# Patient Record
Sex: Female | Born: 1955
Health system: Southern US, Community
[De-identification: ages and names within clinical notes are randomized; demographics above are authoritative.]

## PROBLEM LIST (undated history)

## (undated) ENCOUNTER — Encounter: Attending: Internal Medicine | Primary: Internal Medicine

## (undated) ENCOUNTER — Ambulatory Visit: Attending: Pharmacist | Primary: Pharmacist

## (undated) ENCOUNTER — Ambulatory Visit: Payer: PRIVATE HEALTH INSURANCE

## (undated) ENCOUNTER — Telehealth: Attending: Internal Medicine | Primary: Internal Medicine

## (undated) ENCOUNTER — Encounter

## (undated) ENCOUNTER — Ambulatory Visit: Payer: PRIVATE HEALTH INSURANCE | Attending: Internal Medicine | Primary: Internal Medicine

## (undated) ENCOUNTER — Telehealth

## (undated) DIAGNOSIS — J930 Spontaneous tension pneumothorax: Secondary | ICD-10-CM

## (undated) DIAGNOSIS — R51 Headache: Secondary | ICD-10-CM

## (undated) DIAGNOSIS — H269 Unspecified cataract: Secondary | ICD-10-CM

## (undated) DIAGNOSIS — K529 Noninfective gastroenteritis and colitis, unspecified: Secondary | ICD-10-CM

## (undated) DIAGNOSIS — K649 Unspecified hemorrhoids: Secondary | ICD-10-CM

## (undated) DIAGNOSIS — K219 Gastro-esophageal reflux disease without esophagitis: Secondary | ICD-10-CM

## (undated) DIAGNOSIS — J479 Bronchiectasis, uncomplicated: Secondary | ICD-10-CM

## (undated) DIAGNOSIS — I871 Compression of vein: Secondary | ICD-10-CM

## (undated) DIAGNOSIS — L439 Lichen planus, unspecified: Secondary | ICD-10-CM

## (undated) DIAGNOSIS — A310 Pulmonary mycobacterial infection: Principal | ICD-10-CM

## (undated) DIAGNOSIS — S43006A Unspecified dislocation of unspecified shoulder joint, initial encounter: Secondary | ICD-10-CM

## (undated) DIAGNOSIS — J449 Chronic obstructive pulmonary disease, unspecified: Secondary | ICD-10-CM

## (undated) DIAGNOSIS — E538 Deficiency of other specified B group vitamins: Secondary | ICD-10-CM

## (undated) DIAGNOSIS — M1990 Unspecified osteoarthritis, unspecified site: Secondary | ICD-10-CM

## (undated) HISTORY — DX: Gastro-esophageal reflux disease without esophagitis: K21.9

## (undated) HISTORY — DX: Lichen planus, unspecified: L43.9

## (undated) HISTORY — PX: KNEE SURGERY: SHX244

## (undated) HISTORY — PX: OTHER SURGICAL HISTORY: SHX169

## (undated) HISTORY — DX: Deficiency of other specified B group vitamins: E53.8

## (undated) HISTORY — DX: Noninfective gastroenteritis and colitis, unspecified: K52.9

## (undated) HISTORY — DX: Pulmonary mycobacterial infection: A31.0

## (undated) HISTORY — DX: Unspecified cataract: H26.9

## (undated) HISTORY — DX: Chronic obstructive pulmonary disease, unspecified: J44.9

## (undated) HISTORY — DX: Unspecified dislocation of unspecified shoulder joint, initial encounter: S43.006A

## (undated) HISTORY — DX: Bronchiectasis, uncomplicated: J47.9

## (undated) HISTORY — DX: Headache: R51

## (undated) HISTORY — PX: TONSILLECTOMY: SUR1361

---
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
## 2004-06-21 ENCOUNTER — Other Ambulatory Visit: Admission: RE | Admit: 2004-06-21 | Discharge: 2004-06-21 | Payer: Self-pay | Admitting: Obstetrics and Gynecology

## 2004-06-28 ENCOUNTER — Encounter: Admission: RE | Admit: 2004-06-28 | Discharge: 2004-06-28 | Payer: Self-pay | Admitting: Obstetrics and Gynecology

## 2004-10-12 ENCOUNTER — Ambulatory Visit: Payer: Self-pay | Admitting: Gastroenterology

## 2004-11-15 ENCOUNTER — Ambulatory Visit: Payer: Self-pay | Admitting: Internal Medicine

## 2005-01-17 ENCOUNTER — Ambulatory Visit: Payer: Self-pay | Admitting: Internal Medicine

## 2005-03-29 ENCOUNTER — Ambulatory Visit: Payer: Self-pay | Admitting: Internal Medicine

## 2005-05-24 ENCOUNTER — Ambulatory Visit: Payer: Self-pay | Admitting: Internal Medicine

## 2005-07-14 ENCOUNTER — Ambulatory Visit: Payer: Self-pay | Admitting: Internal Medicine

## 2005-08-22 ENCOUNTER — Ambulatory Visit: Payer: Self-pay | Admitting: Internal Medicine

## 2005-10-06 ENCOUNTER — Ambulatory Visit: Payer: Self-pay | Admitting: Internal Medicine

## 2005-10-13 ENCOUNTER — Ambulatory Visit: Payer: Self-pay | Admitting: Internal Medicine

## 2005-10-18 ENCOUNTER — Ambulatory Visit: Payer: Self-pay | Admitting: Internal Medicine

## 2005-11-02 ENCOUNTER — Ambulatory Visit: Payer: Self-pay | Admitting: Internal Medicine

## 2005-11-10 ENCOUNTER — Ambulatory Visit: Payer: Self-pay | Admitting: Cardiology

## 2005-11-11 ENCOUNTER — Ambulatory Visit: Payer: Self-pay | Admitting: Pulmonary Disease

## 2005-12-20 ENCOUNTER — Ambulatory Visit: Payer: Self-pay | Admitting: Pulmonary Disease

## 2006-04-06 ENCOUNTER — Ambulatory Visit: Payer: Self-pay | Admitting: Internal Medicine

## 2006-07-10 ENCOUNTER — Ambulatory Visit: Payer: Self-pay | Admitting: Internal Medicine

## 2006-08-02 ENCOUNTER — Ambulatory Visit: Payer: Self-pay | Admitting: Internal Medicine

## 2006-09-14 ENCOUNTER — Ambulatory Visit: Payer: Self-pay | Admitting: Internal Medicine

## 2006-10-09 ENCOUNTER — Ambulatory Visit: Payer: Self-pay | Admitting: Internal Medicine

## 2006-10-11 ENCOUNTER — Ambulatory Visit: Payer: Self-pay | Admitting: Internal Medicine

## 2006-12-30 DIAGNOSIS — M810 Age-related osteoporosis without current pathological fracture: Secondary | ICD-10-CM | POA: Insufficient documentation

## 2006-12-30 DIAGNOSIS — J449 Chronic obstructive pulmonary disease, unspecified: Secondary | ICD-10-CM | POA: Insufficient documentation

## 2006-12-30 DIAGNOSIS — K219 Gastro-esophageal reflux disease without esophagitis: Secondary | ICD-10-CM | POA: Insufficient documentation

## 2006-12-30 DIAGNOSIS — E538 Deficiency of other specified B group vitamins: Secondary | ICD-10-CM | POA: Insufficient documentation

## 2006-12-30 DIAGNOSIS — J45909 Unspecified asthma, uncomplicated: Secondary | ICD-10-CM | POA: Insufficient documentation

## 2006-12-30 DIAGNOSIS — G8929 Other chronic pain: Secondary | ICD-10-CM | POA: Insufficient documentation

## 2006-12-30 DIAGNOSIS — L439 Lichen planus, unspecified: Secondary | ICD-10-CM | POA: Insufficient documentation

## 2006-12-30 DIAGNOSIS — N809 Endometriosis, unspecified: Secondary | ICD-10-CM | POA: Insufficient documentation

## 2007-01-11 ENCOUNTER — Ambulatory Visit: Payer: Self-pay | Admitting: Internal Medicine

## 2007-01-11 LAB — CONVERTED CEMR LAB
Hemoglobin, Urine: NEGATIVE
Leukocytes, UA: NEGATIVE
RBC / HPF: NONE SEEN (ref <3)
Specific Gravity, Urine: 1.01 (ref 1.005–1.03)
Urine Glucose: NEGATIVE mg/dL
WBC, UA: NONE SEEN cells/hpf (ref <3)
pH: 6.5 (ref 5.0–8.0)

## 2007-01-30 ENCOUNTER — Ambulatory Visit: Payer: Self-pay | Admitting: Internal Medicine

## 2007-01-31 ENCOUNTER — Encounter: Payer: Self-pay | Admitting: Internal Medicine

## 2007-02-04 ENCOUNTER — Encounter: Payer: Self-pay | Admitting: Gastroenterology

## 2007-02-15 ENCOUNTER — Ambulatory Visit: Payer: Self-pay | Admitting: Internal Medicine

## 2007-04-11 ENCOUNTER — Ambulatory Visit: Payer: Self-pay | Admitting: Internal Medicine

## 2007-04-11 LAB — CONVERTED CEMR LAB
Hemoglobin, Urine: NEGATIVE
Ketones, ur: NEGATIVE mg/dL
Nitrite: NEGATIVE
Protein, ur: NEGATIVE mg/dL
Urobilinogen, UA: 0.2 (ref 0.0–1.0)

## 2007-06-11 ENCOUNTER — Telehealth: Payer: Self-pay | Admitting: Internal Medicine

## 2007-07-19 ENCOUNTER — Ambulatory Visit: Payer: Self-pay | Admitting: Internal Medicine

## 2007-07-19 LAB — CONVERTED CEMR LAB
Bilirubin Urine: NEGATIVE
Glucose, Urine, Semiquant: NEGATIVE
Ketones, urine, test strip: NEGATIVE
Protein, U semiquant: NEGATIVE
Urobilinogen, UA: NEGATIVE
pH: 5

## 2007-07-20 ENCOUNTER — Encounter (INDEPENDENT_AMBULATORY_CARE_PROVIDER_SITE_OTHER): Payer: Self-pay | Admitting: *Deleted

## 2007-07-20 LAB — CONVERTED CEMR LAB
BUN: 6 mg/dL (ref 6–23)
Basophils Absolute: 0 10*3/uL (ref 0.0–0.1)
Basophils Relative: 0.4 % (ref 0.0–1.0)
CO2: 29 meq/L (ref 19–32)
Calcium: 9.6 mg/dL (ref 8.4–10.5)
Cholesterol: 213 mg/dL (ref 0–200)
Direct LDL: 124.4 mg/dL
GFR calc Af Amer: 98 mL/min
GFR calc non Af Amer: 81 mL/min
HDL: 60.8 mg/dL (ref >39.0)
Lymphocytes Relative: 29.9 % (ref 12.0–46.0)
MCHC: 34.6 g/dL (ref 30.0–36.0)
Monocytes Absolute: 0.5 10*3/uL (ref 0.2–0.7)
Monocytes Relative: 6.2 % (ref 3.0–11.0)
Neutro Abs: 4.9 10*3/uL (ref 1.4–7.7)
Platelets: 429 10*3/uL — ABNORMAL HIGH (ref 150–400)
Potassium: 4 meq/L (ref 3.5–5.1)
TSH: 0.63 microintl units/mL (ref 0.35–5.50)
Total CHOL/HDL Ratio: 3.5
VLDL: 24 mg/dL (ref 0–40)
Vitamin B-12: 477 pg/mL (ref 211–911)

## 2007-10-02 ENCOUNTER — Ambulatory Visit: Payer: Self-pay | Admitting: Internal Medicine

## 2007-10-18 ENCOUNTER — Telehealth (INDEPENDENT_AMBULATORY_CARE_PROVIDER_SITE_OTHER): Payer: Self-pay | Admitting: *Deleted

## 2007-10-18 ENCOUNTER — Ambulatory Visit: Payer: Self-pay | Admitting: Internal Medicine

## 2007-10-19 ENCOUNTER — Encounter: Payer: Self-pay | Admitting: Internal Medicine

## 2007-10-19 ENCOUNTER — Ambulatory Visit: Payer: Self-pay | Admitting: Gastroenterology

## 2007-10-22 ENCOUNTER — Encounter: Admission: RE | Admit: 2007-10-22 | Discharge: 2007-10-22 | Payer: Self-pay | Admitting: Gastroenterology

## 2007-10-22 LAB — CONVERTED CEMR LAB
AST: 21 units/L (ref 0–37)
Albumin: 4.4 g/dL (ref 3.5–5.2)
Alkaline Phosphatase: 49 units/L (ref 39–117)
BUN: 7 mg/dL (ref 6–23)
Basophils Absolute: 0 10*3/uL (ref 0.0–0.1)
Basophils Relative: 0.4 % (ref 0.0–1.0)
Chloride: 105 meq/L (ref 96–112)
Creatinine, Ser: 0.7 mg/dL (ref 0.4–1.2)
HCT: 41.1 % (ref 36.0–46.0)
MCHC: 35.2 g/dL (ref 30.0–36.0)
Neutrophils Relative %: 65.2 % (ref 43.0–77.0)
RBC: 4.05 M/uL (ref 3.87–5.11)
RDW: 11.9 % (ref 11.5–14.6)
Sodium: 142 meq/L (ref 135–145)
Total Bilirubin: 0.7 mg/dL (ref 0.3–1.2)

## 2007-11-06 ENCOUNTER — Ambulatory Visit: Payer: Self-pay | Admitting: Gastroenterology

## 2007-12-04 ENCOUNTER — Telehealth (INDEPENDENT_AMBULATORY_CARE_PROVIDER_SITE_OTHER): Payer: Self-pay | Admitting: *Deleted

## 2007-12-13 ENCOUNTER — Ambulatory Visit: Payer: Self-pay | Admitting: Internal Medicine

## 2007-12-24 DIAGNOSIS — Z87898 Personal history of other specified conditions: Secondary | ICD-10-CM | POA: Insufficient documentation

## 2007-12-24 DIAGNOSIS — J479 Bronchiectasis, uncomplicated: Secondary | ICD-10-CM | POA: Insufficient documentation

## 2007-12-24 DIAGNOSIS — K559 Vascular disorder of intestine, unspecified: Secondary | ICD-10-CM | POA: Insufficient documentation

## 2007-12-24 DIAGNOSIS — F17200 Nicotine dependence, unspecified, uncomplicated: Secondary | ICD-10-CM | POA: Insufficient documentation

## 2007-12-24 DIAGNOSIS — K7689 Other specified diseases of liver: Secondary | ICD-10-CM | POA: Insufficient documentation

## 2007-12-24 DIAGNOSIS — IMO0002 Reserved for concepts with insufficient information to code with codable children: Secondary | ICD-10-CM | POA: Insufficient documentation

## 2008-02-20 ENCOUNTER — Encounter: Payer: Self-pay | Admitting: Family Medicine

## 2008-03-05 ENCOUNTER — Ambulatory Visit: Payer: Self-pay | Admitting: Internal Medicine

## 2008-03-05 ENCOUNTER — Ambulatory Visit: Payer: Self-pay | Admitting: Family Medicine

## 2008-03-05 ENCOUNTER — Telehealth (INDEPENDENT_AMBULATORY_CARE_PROVIDER_SITE_OTHER): Payer: Self-pay | Admitting: *Deleted

## 2008-03-06 ENCOUNTER — Telehealth (INDEPENDENT_AMBULATORY_CARE_PROVIDER_SITE_OTHER): Payer: Self-pay | Admitting: *Deleted

## 2008-03-27 ENCOUNTER — Encounter: Payer: Self-pay | Admitting: Internal Medicine

## 2008-04-14 ENCOUNTER — Encounter: Payer: Self-pay | Admitting: Internal Medicine

## 2008-04-14 ENCOUNTER — Ambulatory Visit: Payer: Self-pay | Admitting: Gastroenterology

## 2008-04-30 ENCOUNTER — Encounter: Payer: Self-pay | Admitting: Internal Medicine

## 2008-05-22 ENCOUNTER — Telehealth (INDEPENDENT_AMBULATORY_CARE_PROVIDER_SITE_OTHER): Payer: Self-pay | Admitting: *Deleted

## 2008-05-23 ENCOUNTER — Ambulatory Visit: Payer: Self-pay | Admitting: Internal Medicine

## 2008-05-26 ENCOUNTER — Ambulatory Visit: Payer: Self-pay | Admitting: Internal Medicine

## 2008-05-28 LAB — CONVERTED CEMR LAB
CO2: 29 meq/L (ref 19–32)
Calcium: 9.3 mg/dL (ref 8.4–10.5)
Chloride: 102 meq/L (ref 96–112)
Eosinophils Absolute: 0.1 10*3/uL (ref 0.0–0.7)
Eosinophils Relative: 1.5 % (ref 0.0–5.0)
Folate: 12 ng/mL
HDL: 50.7 mg/dL (ref >39.0)
Monocytes Relative: 5.4 % (ref 3.0–12.0)
Neutrophils Relative %: 68.3 % (ref 43.0–77.0)
Platelets: 480 10*3/uL — ABNORMAL HIGH (ref 150–400)
Sodium: 139 meq/L (ref 135–145)
TSH: 1.27 microintl units/mL (ref 0.35–5.50)
Total CHOL/HDL Ratio: 3.2
VLDL: 21 mg/dL (ref 0–40)
WBC: 9.5 10*3/uL (ref 4.5–10.5)

## 2008-06-25 ENCOUNTER — Telehealth: Payer: Self-pay | Admitting: Internal Medicine

## 2008-06-26 ENCOUNTER — Encounter (INDEPENDENT_AMBULATORY_CARE_PROVIDER_SITE_OTHER): Payer: Self-pay | Admitting: *Deleted

## 2008-07-09 ENCOUNTER — Telehealth (INDEPENDENT_AMBULATORY_CARE_PROVIDER_SITE_OTHER): Payer: Self-pay | Admitting: *Deleted

## 2008-07-09 ENCOUNTER — Ambulatory Visit: Payer: Self-pay | Admitting: Internal Medicine

## 2008-07-28 ENCOUNTER — Telehealth (INDEPENDENT_AMBULATORY_CARE_PROVIDER_SITE_OTHER): Payer: Self-pay | Admitting: *Deleted

## 2008-07-29 ENCOUNTER — Ambulatory Visit: Payer: Self-pay | Admitting: Internal Medicine

## 2008-07-30 ENCOUNTER — Ambulatory Visit: Payer: Self-pay | Admitting: Cardiovascular Disease

## 2008-07-30 ENCOUNTER — Telehealth (INDEPENDENT_AMBULATORY_CARE_PROVIDER_SITE_OTHER): Payer: Self-pay | Admitting: *Deleted

## 2008-09-19 ENCOUNTER — Encounter (INDEPENDENT_AMBULATORY_CARE_PROVIDER_SITE_OTHER): Payer: Self-pay | Admitting: *Deleted

## 2008-11-17 ENCOUNTER — Telehealth: Payer: Self-pay | Admitting: Internal Medicine

## 2008-11-25 ENCOUNTER — Ambulatory Visit: Payer: Self-pay | Admitting: Internal Medicine

## 2008-11-25 LAB — CONVERTED CEMR LAB
Blood in Urine, dipstick: NEGATIVE
Glucose, Urine, Semiquant: NEGATIVE
Specific Gravity, Urine: <1.005
WBC Urine, dipstick: NEGATIVE

## 2009-02-27 ENCOUNTER — Ambulatory Visit: Payer: Self-pay | Admitting: Internal Medicine

## 2009-02-27 DIAGNOSIS — R002 Palpitations: Secondary | ICD-10-CM | POA: Insufficient documentation

## 2009-03-03 ENCOUNTER — Encounter (INDEPENDENT_AMBULATORY_CARE_PROVIDER_SITE_OTHER): Payer: Self-pay | Admitting: *Deleted

## 2009-03-16 ENCOUNTER — Telehealth: Payer: Self-pay | Admitting: Internal Medicine

## 2009-05-25 ENCOUNTER — Ambulatory Visit: Payer: Self-pay | Admitting: Internal Medicine

## 2009-07-30 ENCOUNTER — Telehealth: Payer: Self-pay | Admitting: Internal Medicine

## 2009-08-01 ENCOUNTER — Encounter (INDEPENDENT_AMBULATORY_CARE_PROVIDER_SITE_OTHER): Payer: Self-pay | Admitting: *Deleted

## 2009-08-26 ENCOUNTER — Ambulatory Visit: Payer: Self-pay | Admitting: Internal Medicine

## 2009-08-26 ENCOUNTER — Other Ambulatory Visit: Admission: RE | Admit: 2009-08-26 | Discharge: 2009-08-26 | Payer: Self-pay | Admitting: Internal Medicine

## 2009-08-26 ENCOUNTER — Encounter: Payer: Self-pay | Admitting: Internal Medicine

## 2009-08-28 ENCOUNTER — Encounter: Admission: RE | Admit: 2009-08-28 | Discharge: 2009-08-28 | Payer: Self-pay | Admitting: Internal Medicine

## 2009-08-31 ENCOUNTER — Encounter (INDEPENDENT_AMBULATORY_CARE_PROVIDER_SITE_OTHER): Payer: Self-pay | Admitting: *Deleted

## 2009-08-31 LAB — CONVERTED CEMR LAB
AST: 17 units/L (ref 0–37)
Albumin: 4 g/dL (ref 3.5–5.2)
Alkaline Phosphatase: 63 units/L (ref 39–117)
Bilirubin, Direct: 0 mg/dL (ref 0.0–0.3)
Cholesterol: 170 mg/dL (ref 0–200)
Folate: 14.9 ng/mL
Total Protein: 7.6 g/dL (ref 6.0–8.3)
Triglycerides: 64 mg/dL (ref 0.0–149.0)
Vit D, 25-Hydroxy: 17 ng/mL — ABNORMAL LOW (ref 30–89)
Vitamin B-12: 741 pg/mL (ref 211–911)

## 2009-09-10 ENCOUNTER — Encounter (INDEPENDENT_AMBULATORY_CARE_PROVIDER_SITE_OTHER): Payer: Self-pay | Admitting: *Deleted

## 2009-11-13 ENCOUNTER — Telehealth: Payer: Self-pay | Admitting: Internal Medicine

## 2010-01-26 ENCOUNTER — Ambulatory Visit: Payer: Self-pay | Admitting: Internal Medicine

## 2010-05-21 ENCOUNTER — Telehealth: Payer: Self-pay | Admitting: Internal Medicine

## 2010-07-23 ENCOUNTER — Ambulatory Visit: Payer: Self-pay | Admitting: Internal Medicine

## 2010-09-27 ENCOUNTER — Telehealth: Payer: Self-pay | Admitting: Internal Medicine

## 2010-10-25 ENCOUNTER — Telehealth: Payer: Self-pay | Admitting: Internal Medicine

## 2010-10-27 ENCOUNTER — Telehealth: Payer: Self-pay | Admitting: Internal Medicine

## 2010-11-22 ENCOUNTER — Ambulatory Visit: Payer: Self-pay | Admitting: Internal Medicine

## 2010-11-22 DIAGNOSIS — G4700 Insomnia, unspecified: Secondary | ICD-10-CM | POA: Insufficient documentation

## 2010-12-26 ENCOUNTER — Encounter: Payer: Self-pay | Admitting: Gastroenterology

## 2011-01-02 LAB — CONVERTED CEMR LAB
Basophils Relative: 0.4 % (ref 0.0–3.0)
Calcium: 9.4 mg/dL (ref 8.4–10.5)
Chloride: 106 meq/L (ref 96–112)
Creatinine, Ser: 0.7 mg/dL (ref 0.4–1.2)
Eosinophils Relative: 1.2 % (ref 0.0–5.0)
GFR calc non Af Amer: 93.82 mL/min (ref >60)
Lymphocytes Relative: 26.7 % (ref 12.0–46.0)
MCV: 103.4 fL — ABNORMAL HIGH (ref 78.0–100.0)
Monocytes Relative: 5.4 % (ref 3.0–12.0)
Neutrophils Relative %: 66.3 % (ref 43.0–77.0)
RBC: 3.75 M/uL — ABNORMAL LOW (ref 3.87–5.11)
WBC: 9.3 10*3/uL (ref 4.5–10.5)

## 2011-01-05 NOTE — Progress Notes (Signed)
Summary: refill  Phone Note Refill Request Message from:  Fax from Pharmacy on October 25, 2010 10:12 AM  Refills Requested: Medication #1:  VICODIN HP 10-660 MG  TABS 1 by mouth three times a day - PUT ON FILE - PT WILL CALL WHEN READY TO FILL   Dosage confirmed as above?Dosage Confirmed   Last Refilled: 09/27/2010   Notes: #90 patient is changing pharmacy - [facility] [phone] -  Initial call taken by: Okey Regal [place],  October 25, 2010 10:17 AM  Follow-up for Phone Call        done [place] E. Paz MD  October 25, 2010 4:57 PM     New/Updated Medications: VICODIN HP 10-660 MG  TABS (HYDROCODONE-ACETAMINOPHEN) 1 by mouth three times a day Prescriptions: VICODIN HP 10-660 MG  TABS (HYDROCODONE-ACETAMINOPHEN) 1 by mouth three times a day  #90 x 3   Entered and Authorized by:   Elita Quick E. Paz MD   Signed by:   [name]. Paz MD on 10/25/2010   Method used:   Print then Give to Patient   RxID:   [id number]

## 2011-01-05 NOTE — Progress Notes (Signed)
Summary: REFILL  Phone Note Refill Request Message from:  Pharmacy  Refills Requested: Medication #1:  VICODIN HP 10-660 MG  TABS 1 by mouth three times a day - PUT ON FILE - PT WILL CALL WHEN READY TO FILL [facility] [place]  Initial call taken by: Jeremy Johann CMA,  May 21, 2010 4:45 PM  Follow-up for Phone Call        2-22-11LAST FILLED #90 3, LAST OV................Marland KitchenFelecia Deloach CMA  May 21, 2010 4:48 PM   ok 90 and 3 RF Martavia Tye E. Lysette Lindenbaum MD  May 24, 2010 11:51 AM     Prescriptions: VICODIN HP 10-660 MG  TABS (HYDROCODONE-ACETAMINOPHEN) 1 by mouth three times a day - PUT ON FILE - PT WILL CALL WHEN READY TO FILL  #90 x 3   Entered by:   Jeremy Johann CMA   Authorized by:   [name]. Jazsmin Couse MD   Signed by:   Jeremy Johann CMA on 05/24/2010   Method used:   Printed then faxed to ...       [facility] [phone][facility] (retail)       [address]       [place], [place]  [address]       Ph: [phone]       Fax: [phone]   RxID:   [id number]

## 2011-01-05 NOTE — Assessment & Plan Note (Signed)
Summary: rto 6 months/cbs   Vital Signs:  Patient profile:   55 year old female Weight:      130.50 pounds Pulse rate:   96 / minute Pulse rhythm:   regular BP sitting:   126 / 82  (left arm) Cuff size:   regular  Vitals Entered By: Army Fossa CMA (July 23, 2010 10:39 AM)  History of Present Illness: ROV After she lost her part-time job, she gained weight, is now doing [employer], has lost about 10 pounds already  Allergies: 1)  ! Levaquin 2)  ! * Antihistamines 3)  ! * Decongestants 4)  ! Nsaids 5)  ! Fosamax  Past History:  Past Medical History: Asthma,adult onset COPD, per CT  11/10/05, with minimal bronchiectasis rll GERD Osteoarthritis Osteoporosis--Dexa per gyn Recurrent isch colitis CIGARETTE SMOKER  HEADACHES, HX OF  LICHEN PLANUS   Hx of B12 DEFICIENCY   ENDOMETRIOSIS  Cataracts  Past Surgical History: Reviewed history from 10/18/2007 and no changes required. 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  Smokes 1 ppd at present   Drinks wine daily  Review of Systems       continue smoking but plans to quit in the future OA: pain is mostly in the right hip. Had a left knee injury, that is getting better Her GERD symptoms are well controlled Denies anxiety or depression per se, her spirits remain high despite not having a job Taking over-the-counter calcium and vitamin D   Physical Exam  General:  alert, well-developed, and well-nourished.   Lungs:  normal respiratory effort, no intercostal retractions, no accessory muscle use, and normal breath sounds.   Heart:  normal rate, regular rhythm, and no murmur.     Impression & Recommendations:  Problem # 1:  OSTEOARTHRITIS (ICD-715.90) pt will call when she needs more refills Her updated medication list for this problem includes:    Vicodin Hp  10-660 Mg Tabs (Hydrocodone-acetaminophen) .Marland Kitchen... 1 by mouth three times a day - put on file - pt will call when ready to fill    Aspirin 81 Mg Tbec (Aspirin) ..... Once daily  Problem # 2:  GERD (ICD-530.81) cost  of the medications is an issue, will call when she needs samples of Nexium or other available  PPI Her updated medication list for this problem includes:    Tums 500 Mg Chew (Calcium carbonate antacid) .Marland Kitchen... 4-8 by mouth qd    Omeprazole 20 Mg Cpdr (Omeprazole) .Marland Kitchen... 2  by mouth once daily  Problem # 3:  OSTEOPOROSIS (ICD-733.00) last vitamin D was low, she took ergocalciferol, now on over-the-counter medication Her updated medication list for this problem includes:    Caltrate 600+d Plus 600-400 Mg-unit Tabs (Calcium carbonate-vit d-min) ..... Once daily  Problem # 4:  the patient is concerned about the cost of her healthcare she has no insurance We agreed she'll come back in 4 months for a physical   No blood work today  Patient Instructions: 1)  Please schedule a follow-up appointment in 4 months .  2)  my new nurse is Engineer, agricultural

## 2011-01-05 NOTE — Progress Notes (Signed)
Summary: Pain med rx  Phone Note Refill Request Message from:  Pharmacy  Refills Requested: Medication #1:  VICODIN HP 10-660 MG  TABS 1 by mouth three times a day Initial call taken by: Lucious Groves CMA,  October 27, 2010 3:12 PM  Follow-up for Phone Call        Hard copy of prescription faxed. Follow-up by: Lucious Groves CMA,  October 27, 2010 3:20 PM    Prescriptions: VICODIN HP 10-660 MG  TABS (HYDROCODONE-ACETAMINOPHEN) 1 by mouth three times a day  #90 x 3   Entered by:   Lucious Groves CMA   Authorized by:   [name]. Paz MD   Signed by:   Lucious Groves CMA on 10/27/2010   Method used:   Printed then faxed to ...       [facility]* (retail)       [address]       [place], [place]  [address]       Ph: [phone]       Fax: [phone]   RxID:   [id number]

## 2011-01-05 NOTE — Progress Notes (Signed)
Summary: VICODIN REFILL  Phone Note Refill Request Call back at Home Phone [phone] Message from:  Patient on September 27, 2010 11:45 AM  Refills Requested: Medication #1:  VICODIN HP 10-660 MG  TABS 1 by mouth three times a day - PUT ON FILE - PT WILL CALL WHEN READY TO FILL WANTS TO PICK UP PRESCRIPTION THIS WED 09/29/2010 AT  [facility],  [address],  [place],      STILL DOESNT HAVE JOB OR INS---CAN SHE GET SAMPLES OF NEXIUM?  Initial call taken by: Jerolyn Shin,  September 27, 2010 11:48 AM  Follow-up for Phone Call        last refilled 05/24/10 90, 3rf Follow-up by: Army Fossa CMA,  September 27, 2010 11:49 AM  Additional Follow-up for Phone Call Additional follow up Details #1::        ok Vicodin #90, one refill Okay samples of Nexium if available Sevrin Sally E. Tramane Gorum MD  September 27, 2010 11:53 AM     Additional Follow-up for Phone Call Additional follow up Details #2::    left pt a detailed message that nexium samples are upfront for her. Follow-up by: Army Fossa CMA,  September 27, 2010 1:36 PM  New/Updated Medications: NEXIUM 40 MG CPDR (ESOMEPRAZOLE MAGNESIUM)  Prescriptions: VICODIN HP 10-660 MG  TABS (HYDROCODONE-ACETAMINOPHEN) 1 by mouth three times a day - PUT ON FILE - PT WILL CALL WHEN READY TO FILL  #90 x 0   Entered by:   Army Fossa CMA   Authorized by:   [name]. Mert Dietrick MD   Signed by:   Army Fossa CMA on 09/27/2010   Method used:   Printed then faxed to ...       [facility] # [phone]* (retail)       [address]       [place], [place]  [address]       Ph: [phone]       Fax: [phone]   RxID:   [id number]

## 2011-01-05 NOTE — Assessment & Plan Note (Signed)
Summary: 6 month roa//lh   Vital Signs:  Patient profile:   55 year old female Height:      67 inches Weight:      135.2 pounds BMI:     21.25 Pulse rate:   64 / minute BP sitting:   112 / 64  Vitals Entered By: Kandice Hams (January 26, 2010 10:23 AM) CC: f/u Comments needs vicodin refill   History of Present Illness: COPD-- was sick in December , went to a UC, . currently Asx   GERD-- nexium works well but has no insurance and needs to use generics sometimes   Osteoarthritis-- on vicodin, symptoms relatively well controlled ; currently having R  knee pain  CIGARETTE SMOKER -- still 1 PPD    Allergies: 1)  ! Levaquin 2)  ! * Antihistamines 3)  ! * Decongestants 4)  ! Nsaids 5)  ! Fosamax  Past History:  Past Medical History: Asthma,adult onset COPD, per CT  11/10/05, with minimal bronchiectasis rll GERD Osteoarthritis Osteoporosis--Dexa per gyn Recurrent isch colitis CIGARETTE SMOKER  HEADACHES, HX OF  LICHEN PLANUS   Hx of B12 DEFICIENCY   ENDOMETRIOSIS  Cataracts  Past Surgical History: Reviewed history from 10/18/2007 and no changes required. Knee surgeries x 8 ovarian cyst removal  Social History: Reviewed history from 08/26/2009 and no changes required. 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  Smokes 1 ppd at present   Drinks wine daily  Physical Exam  General:  alert and well-developed.   Lungs:  normal respiratory effort, no intercostal retractions, no accessory muscle use, and normal breath sounds.   Heart:  normal rate, regular rhythm, and no murmur.   Extremities:  no lower extremity edema   Impression & Recommendations:  Problem # 1:  OSTEOARTHRITIS (ICD-715.90) RF Vicodin Her updated medication list for this problem includes:    Vicodin Hp 10-660 Mg Tabs (Hydrocodone-acetaminophen) .Marland Kitchen... 1 by mouth three times a day - put on file - pt will call when ready to fill  Aspirin 81 Mg Tbec (Aspirin) ..... Once daily  Problem # 2:  GERD (ICD-530.81) having a hard time affording  Nexium provided samples for Nexium and  Aciphex. see instructions  advised to call in 3  months and get more samples if so desire Her updated medication list for this problem includes:    Tums 500 Mg Chew (Calcium carbonate antacid) .Marland KitchenMarland KitchenMarland KitchenMarland Kitchen 4-8 by mouth qd    Omeprazole 20 Mg Cpdr (Omeprazole) .Marland Kitchen... 2  by mouth once daily  Complete Medication List: 1)  Vicodin Hp 10-660 Mg Tabs (Hydrocodone-acetaminophen) .Marland Kitchen.. 1 by mouth three times a day - put on file - pt will call when ready to fill 2)  Tums 500 Mg Chew (Calcium carbonate antacid) .... 4-8 by mouth qd 3)  Qualaquin 324 Mg Caps (Quinine sulfate) .... Prn 4)  Aspirin 81 Mg Tbec (Aspirin) .... Once daily 5)  B12  6)  Caltrate 600+d Plus 600-400 Mg-unit Tabs (Calcium carbonate-vit d-min) .... Once daily 7)  Omeprazole 20 Mg Cpdr (Omeprazole) .... 2  by mouth once daily  Patient Instructions: 1)  take either omeprazole , nexium or aciphex once a day for GERD 2)  Please schedule a follow-up appointment in 6 months .  Prescriptions: VICODIN HP 10-660 MG  TABS (HYDROCODONE-ACETAMINOPHEN) 1 by mouth three times a day - PUT ON FILE - PT WILL CALL WHEN READY TO FILL  #90 x 3  Entered and Authorized by:   [name]. Paz MD   Signed by:   [name]. Paz MD on 01/26/2010   Method used:   Print then Give to Patient   RxID:   [id number]

## 2011-01-06 NOTE — Assessment & Plan Note (Signed)
Summary: rto 4 months/cbs   Vital Signs:  Patient profile:   55 year old female Height:      67 inches Weight:      133.25 pounds BMI:     20.95 Pulse rate:   101 / minute Pulse rhythm:   regular BP sitting:   118 / 74  (left arm) Cuff size:   regular  Vitals Entered By: Army Fossa CMA (November 22, 2010 10:56 AM) CC: Pt here for 3 month f/u- does not want labwork   History of Present Illness: ROV doing well   Current Medications (verified): 1)  Vicodin Hp 10-660 Mg  Tabs (Hydrocodone-Acetaminophen) .Marland Kitchen.. 1 By Mouth Three Times A Day 2)  Tums 500 Mg  Chew (Calcium Carbonate Antacid) .... 4-8 By Mouth Qd 3)  Qualaquin 324 Mg  Caps (Quinine Sulfate) .... Prn 4)  Aspirin 81 Mg  Tbec (Aspirin) .... Once Daily 5)  B12 6)  Caltrate 600+d Plus 600-400 Mg-Unit  Tabs (Calcium Carbonate-Vit D-Min) .... Once Daily 7)  Omeprazole 20 Mg Cpdr (Omeprazole) .... 2  By Mouth Once Daily 8)  Diet Supplement 9)  Nexium 40 Mg Cpdr (Esomeprazole Magnesium)  Allergies (verified): 1)  ! Levaquin 2)  ! * Antihistamines 3)  ! * Decongestants 4)  ! Nsaids 5)  ! Fosamax  Past History:  Past Medical History: Asthma,adult onset COPD, per CT  11/10/05, with minimal bronchiectasis rll GERD Osteoarthritis Osteoporosis--Dexa per gyn Recurrent isch colitis CIGARETTE SMOKER  HEADACHES, HX OF  LICHEN PLANUS   Hx of B12 DEFICIENCY   ENDOMETRIOSIS  Cataracts  Past Surgical History: Reviewed history from 10/18/2007 and no changes required. Knee surgeries x 8 ovarian cyst removal  Social History: Reviewed history from 07/23/2010 and no changes required. 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  Smokes 1 ppd at present   Drinks wine daily  Review of Systems       OA pain well controlled w/ present meds c/o difficulty w/ sleep, sometimes wakes up in the midle of the night and can't go back to sleep    has not been able to see her gyn due to lack of insurance denies N-V-D, no blood in stools  GERD symptoms well controlled  had a flu shot   Physical Exam  General:  alert, well-developed, and well-nourished.   Lungs:  normal respiratory effort, no intercostal retractions, no accessory muscle use, and normal breath sounds.   Heart:  normal rate, regular rhythm, and no murmur.   Psych:  Cognition and judgment appear intact. Alert and cooperative with normal attention span and concentration.  not anxious appearing and not depressed appearing.     Impression & Recommendations:  Problem # 1:  ISCHEMIC COLITIS (ICD-557.9) asx   Problem # 2:  OSTEOPOROSIS (ICD-733.00) good medication compliance w/ Ca and Vit D Her updated medication list for this problem includes:    Caltrate 600+d Plus 600-400 Mg-unit Tabs (Calcium carbonate-vit d-min) ..... Once daily  Problem # 3:  GERD (ICD-530.81) well controlled, uses the omeprazole, if she has some indigestion or moderate heartburn, uses instead Nexium Her updated medication list for this problem includes:    Tums 500 Mg Chew (Calcium carbonate antacid) .Marland KitchenMarland KitchenMarland KitchenMarland Kitchen 4-8 by mouth qd    Omeprazole 20 Mg Cpdr (Omeprazole) .Marland Kitchen... 2  by mouth once daily    Nexium 40 Mg Cpdr (Esomeprazole magnesium)  Problem #  4:  ASTHMA (ICD-493.90) had a flu shot already  Problem # 5:  INSOMNIA-SLEEP DISORDER-UNSPEC (ICD-780.52) see  review of systems, trial with Xanax  Complete Medication List: 1)  Vicodin Hp 10-660 Mg Tabs (Hydrocodone-acetaminophen) .Marland Kitchen.. 1 by mouth three times a day 2)  Tums 500 Mg Chew (Calcium carbonate antacid) .... 4-8 by mouth qd 3)  Qualaquin 324 Mg Caps (Quinine sulfate) .... Prn 4)  Aspirin 81 Mg Tbec (Aspirin) .... Once daily 5)  B12  6)  Caltrate 600+d Plus 600-400 Mg-unit Tabs (Calcium carbonate-vit d-min) .... Once daily 7)  Omeprazole 20 Mg Cpdr (Omeprazole) .... 2  by mouth once daily 8)  Nexium 40 Mg Cpdr (Esomeprazole  magnesium) 9)  Diet Supplement  10)  Alprazolam 0.5 Mg Tabs (Alprazolam) .... 1/2 to 1 by mouth at bedtime as needed insomnia  Patient Instructions: 1)  Please schedule a follow-up appointment in 6 months .  Prescriptions: ALPRAZOLAM 0.5 MG TABS (ALPRAZOLAM) 1/2 to 1 by mouth at bedtime as needed insomnia  #30 x 1   Entered and Authorized by:   Elita Quick E. Paz MD   Signed by:   [name]. Paz MD on 11/22/2010   Method used:   Print then Give to Patient   RxID:   [id number]    Orders Added: 1)  Est. Patient Level II [30865]   Immunization History:  Influenza Immunization History:    Influenza:  historical (09/04/2010)   Immunization History:  Influenza Immunization History:    Influenza:  Historical (09/04/2010)

## 2011-01-26 ENCOUNTER — Telehealth: Payer: Self-pay | Admitting: Internal Medicine

## 2011-02-01 NOTE — Progress Notes (Signed)
Summary: Proof of immunization  Phone Note Call from Patient   Summary of Call: I spoke with the patient and she needs proof of TDap and pneumo sent to her. She is aware that I cannot email it, but will fax to her home number for her.  Initial call taken by: Lucious Groves CMA,  January 26, 2011 11:12 AM

## 2011-02-24 ENCOUNTER — Telehealth: Payer: Self-pay | Admitting: Internal Medicine

## 2011-02-24 ENCOUNTER — Other Ambulatory Visit: Payer: Self-pay | Admitting: Internal Medicine

## 2011-02-24 MED ORDER — HYDROCODONE-ACETAMINOPHEN 10-660 MG PO TABS: 1.0000 | ORAL_TABLET | Freq: Three times a day (TID) | ORAL | Status: DC

## 2011-02-24 NOTE — Telephone Encounter (Signed)
Ok, just 5

## 2011-02-24 NOTE — Telephone Encounter (Signed)
error 

## 2011-02-24 NOTE — Telephone Encounter (Signed)
Pt is calling wanting to pick up her script tomorrow morning for  Hydrocodone.

## 2011-02-24 NOTE — Telephone Encounter (Signed)
[facility] to [facility]

## 2011-02-24 NOTE — Telephone Encounter (Signed)
Only can send in 5 refills.

## 2011-02-24 NOTE — Telephone Encounter (Signed)
Ok 90 and 6 RF

## 2011-04-19 NOTE — Assessment & Plan Note (Signed)
[facility]                         GASTROENTEROLOGY OFFICE NOTE   NAME:[name] [name], [name] [name]                      MRN:          [id number]  DATE:10/19/2007                            DOB:          10-01-1956    PROBLEM:  Abdominal pain and rectal bleeding.   HISTORY:  [name] [name] is a pleasant, 55 year old, white female with a history  of bronchiectasis, osteoarthritis, osteoporosis, she has had an ovarian  cystectomy.  She is known to Dr. Russella [name] but was last seen in 2006.  At  that time, she was having difficulty with nausea.  The patient has a  very interesting history of recurrent ischemic colitis.  She had her  initial episode in January of 2000, at which time she was living in  [place] and says she was hospitalized for eight days.  We do have some  of those records, and colonoscopy done at that time was consistent with  a right-sided ischemic colitis as were the biopsies.  She said she had a  recurrent episode in September of that same year, which was much milder,  and she was not rehospitalized.  She then had a third episode in 2005,  and again was not hospitalized.  Her last colonoscopy was in 2000.  We  do have records from her gastroenterologist in [place], and she did  undergo a hypercoagulable workup after that initial episode and all of  her studies were negative, including antiphospholipid antibody, protein  C and F levels, ANA, cardiolipin antibodies, and the prothrombin  mutations.  She was never placed on any specific therapy for her  recurrent colitis.   The patient did have a recent upper respiratory infection for which she  took an antibiotic.  She has not had any recent cold medications or any  other new medications except for Amoxicillin.  She is not on any  hormones, ACE inhibitors, etc.  The patient had an episode on Wednesday  evening, the 12th, with nausea followed by cramping and diarrhea and  then eventually a progressive episode of  cramping followed by bloody  stools, which continued for about three hours.  Since that time, her  pain has gradually improved, she has not had any vomiting but has been  on a liquid diet, and she has not passed any further blood or had any  bowel movements.  She was seen by Dr. Drue [name] yesterday and then referred  here for evaluation.  Labs done on November 14th show a WBC of 7.2,  hemoglobin 14.5, hematocrit of 41.1, MCV of 101, electrolytes within  normal limits, LFTs within normal limits, iron is 70.   CURRENT MEDICATIONS:  1. B12 supplement daily.  2. Vicodin 10/660 one p.o. t.i.d.  3. Prilosec 20 mg q.h.s.  4. Tums 500 daily.  5. Prevacid 30 daily.  6. Aspirin 81 mg daily.  7. Triamterene/HCTZ p.r.n.  8. Advair b.i.d., she is uncertain of the milligram dosage.   ALLERGIES:  No known drug allergies.  INTOLERANT TO LEVAQUIN, NSAIDs AND  DECONGESTANTS.   PAST MEDICAL HISTORY:  As outlined above.   SOCIAL HISTORY:  The patient  is single, lives with her mother, she is  employed in [employer], she is a smoker one-pack-per-day, drinks  red wine socially.   FAMILY HISTORY:  Pertinent for diabetes in her mother and maternal  grandmother; negative for colon cancer, polyps, or inflammatory bowel  disease.   REVIEW OF SYSTEMS:  Pertinent for arthritic symptoms, intermittent  headaches, sore throat, and occasional night sweats.  Her last menstrual  period was about two years ago.  GI:  Otherwise negative except for GI  as outlined above.   PHYSICAL EXAMINATION:  GENERAL:  A well-developed, thin, white female in  no acute distress.  VITAL SIGNS:  Height is 5 foot 7, weight is 132.6, blood pressure  108/62, pulse is 78.  HEENT:  Nontraumatic, normocephalic, EOMI, PERRLA, sclerae are  anicteric.  NECK:  Supple and without nodes, there is no JVD or bruit heard.  CARDIOVASCULAR:  Regular rate and rhythm with S1 and S2, no murmur, rub,  or gallop.  PULMONARY:  Clear to A&P.   ABDOMEN:  Soft, she is minimally tender in the left mid quadrant, there  is no guarding or rebound, no mass or hepatosplenomegaly, bowel sounds  are active.  RECTAL EXAM:  Not done at this time.   IMPRESSION:  1. A 55 year old, white female with previously documented right-sided      ischemic colitis and two other episodes of presumably left-sided      ischemic colitis now presenting with recurrent abdominal cramping      and diarrhea followed by bloody stools consistent with a mild      episode of segmental ischemic colitis.  Certainly her presentation      is worrisome for an underlying vasculopathy or a clotting disorder.  2. Smoker.  3. History of bronchiectasis.   PLAN:  1. The patient's current episode appears to be mild and is resolving      without any specific therapy.  She was advised to gradually advance      her diet to a soft bland diet over this coming weekend and to drink      liberal fluids.  2. Quit smoking.  3. Hypercoagulable workup was negative in the past, will not repeat      currently.  4. Schedule CT scan of the abdomen and pelvis with CT angiography.  5. A followup office visit with Dr. Russella [name] in approximately two weeks.      We will review her CT, etc., and then she probably should undergo      followup colonoscopy at this point as well.      Mike Gip, PA-C  Electronically Signed      Rachael Fee, MD  Electronically Signed   AE/MedQ  DD: 10/19/2007  DT: 10/20/2007  Job #: [id number]   cc:   Venita Lick. [name] Dar, MD, Clementeen Graham

## 2011-04-19 NOTE — Assessment & Plan Note (Signed)
[facility]                         GASTROENTEROLOGY OFFICE NOTE   NAME:[name] [name], [name] [name]                      MRN:          [id number]  DATE:11/06/2007                            DOB:          March 27, 1956    HISTORY:  [name] [name] returns for follow up of a self-limited bloody  diarrhea which was presumably secondary to recurrent ischemic colitis.  A CT angiogram of the abdomen and pelvis revealed patent mesenteric  vessels and no gastrointestinal abnormalities except for a small cyst in  the right lobe of the liver.  Central L4-L5 disc herniation was noted.  She has been completely asymptomatic for the past several weeks.  Her  last colonoscopy was in March of 2000 in [place] and was normal showing  healing of previously documented ischemic colitis.   CURRENT MEDICATIONS:  Listed on the chart, updated and reviewed.   ALLERGIES:  NO KNOWN DRUG ALLERGIES.  Intolerance to LEVAQUIN, NSAIDs  and DECONGESTANTS.   PHYSICAL EXAMINATION:  GENERAL:  No acute distress.  VITAL SIGNS:  Weight 134 pounds, blood pressure 100/62, pulse 90 and  regular.  CHEST:  Clear to auscultation bilaterally.  CARDIAC:  Regular rate and rhythm without murmurs appreciated.  ABDOMEN:  Soft, nontender with normoactive bowel sounds.  No palpable  organomegaly, masses or hernias.   ASSESSMENT/PLAN:  Presumed recurrent ischemic colitis.  Her most recent  episode was mild.  Her main mesenteric vessels are patent.  I have  advised her to completely discontinue the use of  triamterene/hydrochlorothiazide and to avoid any other diuretics in the  future.  She is to remain off decongestants as was previously  recommended to her several years ago.  She is advised to remain well  hydrated.  Need to exclude colorectal neoplasms, inflammatory bowel  disease and other disorders leading to her recent bleeding. Risks,  benefits, and alternatives to colonoscopy with possible biopsy and  possible polypectomy discussed with the patient, and she consents to  proceed.  She requests to schedule this in January.     Venita Lick. Russella Dar, MD, [facility]  Electronically Signed    MTS/MedQ  DD: 11/06/2007  DT: 11/06/2007  Job #: [id number]   cc:   Willow Ora, MD

## 2011-05-16 ENCOUNTER — Ambulatory Visit (INDEPENDENT_AMBULATORY_CARE_PROVIDER_SITE_OTHER): Payer: Self-pay | Admitting: Internal Medicine

## 2011-05-16 ENCOUNTER — Encounter: Payer: Self-pay | Admitting: Internal Medicine

## 2011-05-16 DIAGNOSIS — J329 Chronic sinusitis, unspecified: Secondary | ICD-10-CM

## 2011-05-16 MED ORDER — AMOXICILLIN 500 MG PO CAPS: 1000.0000 mg | ORAL_CAPSULE | Freq: Two times a day (BID) | ORAL | Status: AC

## 2011-05-16 MED ORDER — PREDNISONE 10 MG PO TABS: ORAL_TABLET | ORAL | Status: AC

## 2011-05-16 MED ORDER — FLUTICASONE PROPIONATE 50 MCG/ACT NA SUSP: 2.0000 | Freq: Every day | NASAL | Status: DC

## 2011-05-16 NOTE — Assessment & Plan Note (Signed)
The patient presents with severe headache for 5 days and right sinus congestion. Although sx may be due to sinusitis, the headache is somehow uncharacteristic  of sinusitis thus I rec a CT head; she declined d/t to cost , she understood that I am concerned about possible intracranial bleeding. Will treat her with steroids, Flonase and antibiotic , she knows to call me if no better. See instructions

## 2011-05-16 NOTE — Patient Instructions (Signed)
Rest, fluids , tylenol Flonase, nose spray x few weeks Prednisone x 8 days Take the antibiotic as prescribed...Marland KitchenMarland KitchenAmoxicillin Call if no better in few days Call anytime if the symptoms are severe, you have high fever , neck pain- stiffness

## 2011-05-16 NOTE — Progress Notes (Signed)
  Subjective:    Patient ID: [name] [name], female    DOB: [birth year], 55 y.o.   MRN: [id number]  HPI Very intense headache for the last 5 days, close to the worse of her life, located at the forehead, bilaterally. 3 days ago developed sinus pressure and discomfort on the right side. She used her nati-pot and she was able to get a lot of yellow and green nasal discharge from the R nostril. She self started a leftover amoxicillin. Today she still has a headache but is less intense, still had the sinus congestion.  Past Medical History  Diagnosis Date  . Asthma   . COPD (chronic obstructive pulmonary disease)     per CT 11/10/05, will minimal bronchiectasis rll  . GERD (gastroesophageal reflux disease)   . Osteoporosis     dexa per gyn  . Colitis   . Headache   . Lichen planus   . B12 deficiency   . Endometriosis   . Cataracts, bilateral    Past Surgical History  Procedure Date  . Knee surgery     x 8  . Ovarian cyst removed      Review of Systems No ear ache or sore throat. No cough or chest congestion No nausea or vomiting She has upper back pain but no neck pain. Some subjective fever.     Objective:   Physical Exam  Constitutional: She appears well-developed and well-nourished. No distress.  HENT:  Head: Normocephalic and atraumatic.  Right Ear: External ear normal.  Left Ear: External ear normal.  Mouth/Throat: No oropharyngeal exudate.       Tender to palpation of the right maxillary sinuses. The left  and frontal sinuses are normal. Nose is slightly congested  Cardiovascular:       Regular rate and rhythm, rate approximately 96  Pulmonary/Chest: Effort normal and breath sounds normal. No respiratory distress. She has no wheezes. She has no rales.  Neurological:       Speech fluent, face symmetric, extra ocular movements intact. Motor and gait normal.   Skin: She is not diaphoretic.  Psychiatric: She has a normal mood and affect. Her behavior is normal.  Judgment and thought content normal.          Assessment & Plan:

## 2011-06-23 ENCOUNTER — Ambulatory Visit (INDEPENDENT_AMBULATORY_CARE_PROVIDER_SITE_OTHER): Payer: Self-pay | Admitting: Internal Medicine

## 2011-06-23 ENCOUNTER — Encounter: Payer: Self-pay | Admitting: Internal Medicine

## 2011-06-23 DIAGNOSIS — M810 Age-related osteoporosis without current pathological fracture: Secondary | ICD-10-CM

## 2011-06-23 DIAGNOSIS — Z0000 Encounter for general adult medical examination without abnormal findings: Secondary | ICD-10-CM | POA: Insufficient documentation

## 2011-06-23 DIAGNOSIS — E538 Deficiency of other specified B group vitamins: Secondary | ICD-10-CM

## 2011-06-23 DIAGNOSIS — J45909 Unspecified asthma, uncomplicated: Secondary | ICD-10-CM

## 2011-06-23 DIAGNOSIS — M1990 Unspecified osteoarthritis, unspecified site: Secondary | ICD-10-CM

## 2011-06-23 DIAGNOSIS — F17200 Nicotine dependence, unspecified, uncomplicated: Secondary | ICD-10-CM

## 2011-06-23 MED ORDER — HYDROCODONE-ACETAMINOPHEN 10-660 MG PO TABS
1.0000 | ORAL_TABLET | Freq: Three times a day (TID) | ORAL | Status: DC
Start: 2011-06-23 — End: 2011-10-20

## 2011-06-23 MED ORDER — ALPRAZOLAM 0.5 MG PO TABS: ORAL_TABLET | ORAL | Status: DC

## 2011-06-23 MED ORDER — MOMETASONE FUROATE 50 MCG/ACT NA SUSP: 2.0000 | Freq: Every day | NASAL | Status: DC

## 2011-06-23 MED ORDER — ESOMEPRAZOLE MAGNESIUM 40 MG PO CPDR: 40.0000 mg | DELAYED_RELEASE_CAPSULE | Freq: Every day | ORAL | Status: DC

## 2011-06-23 NOTE — Progress Notes (Signed)
  Subjective:    Patient ID: [name] [name], female    DOB: October 26, 1956, 55 y.o.   MRN: [id number]  HPI ROV, doing well  Past Medical History  Diagnosis Date  . Asthma     adult onset  . COPD (chronic obstructive pulmonary disease)     per CT 11/10/05, will minimal bronchiectasis rll  . GERD (gastroesophageal reflux disease)   . Osteoporosis     dexa per gyn  . 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 Headache   . Lichen planus   . B12 deficiency   . Endometriosis   . Cataracts, bilateral       Review of Systems No concerns    Objective:   Physical Exam  Constitutional: She is oriented to person, place, and time. She appears well-developed and well-nourished.  Cardiovascular: Normal rate, regular rhythm and normal heart sounds.   No murmur heard. Pulmonary/Chest: Effort normal and breath sounds normal. No respiratory distress. She has no wheezes. She has no rales.  Genitourinary:       Breast exam B neg, no LADs  Musculoskeletal: She exhibits no edema.  Neurological: She is alert and oriented to person, place, and time.          Assessment & Plan:  Here for a ROV, cost of care is an issue, declined labs

## 2011-06-23 NOTE — Assessment & Plan Note (Signed)
Good med compliance  

## 2011-06-23 NOTE — Assessment & Plan Note (Signed)
OA, pain in the knees R hip, takes pain meds prn

## 2011-06-23 NOTE — Assessment & Plan Note (Addendum)
chart reviewed Td 05 pneumonia shot 07  Cscope 4-09 , 1 polyp (@ [place]) PAP-- Nl 08-2009 [facility]--  Nl   08-2009 --- declined [facility] order today (06-23-2011) d/t lack of insurance  She does SBE, breast exam today normal will be unable to see Dr Jennette Kettle  [facility])

## 2011-06-23 NOTE — Assessment & Plan Note (Signed)
Currently on ca and vit D

## 2011-06-23 NOTE — Assessment & Plan Note (Signed)
Counseled ! Cutting down

## 2011-06-23 NOTE — Assessment & Plan Note (Signed)
Asx, lung exam today ok, cutting down tobacco

## 2011-10-18 ENCOUNTER — Other Ambulatory Visit: Payer: Self-pay | Admitting: Internal Medicine

## 2011-10-19 NOTE — Telephone Encounter (Signed)
Last OV and fill date 06/23/11

## 2011-10-20 MED ORDER — HYDROCODONE-ACETAMINOPHEN 10-660 MG PO TABS: 1.0000 | ORAL_TABLET | Freq: Three times a day (TID) | ORAL | Status: DC

## 2011-10-20 NOTE — Telephone Encounter (Signed)
Rx printed and faxed to [facility]

## 2011-10-20 NOTE — Telephone Encounter (Signed)
Ok 90, 3 RF 

## 2011-12-13 ENCOUNTER — Ambulatory Visit (INDEPENDENT_AMBULATORY_CARE_PROVIDER_SITE_OTHER): Payer: 59 | Admitting: Internal Medicine

## 2011-12-13 VITALS — BP 100/62 | HR 93 | Temp 98.6°F | Ht 67.0 in | Wt 121.0 lb

## 2011-12-13 DIAGNOSIS — G8912 Acute post-thoracotomy pain: Secondary | ICD-10-CM | POA: Insufficient documentation

## 2011-12-13 DIAGNOSIS — R634 Abnormal weight loss: Secondary | ICD-10-CM

## 2011-12-13 DIAGNOSIS — E538 Deficiency of other specified B group vitamins: Secondary | ICD-10-CM

## 2011-12-13 DIAGNOSIS — R071 Chest pain on breathing: Secondary | ICD-10-CM

## 2011-12-13 DIAGNOSIS — R0789 Other chest pain: Secondary | ICD-10-CM

## 2011-12-13 LAB — COMPREHENSIVE METABOLIC PANEL
ALT: 14 U/L (ref 0–35)
BUN: 8 mg/dL (ref 6–23)
CO2: 29 mEq/L (ref 19–32)
Calcium: 9.2 mg/dL (ref 8.4–10.5)
Chloride: 105 mEq/L (ref 96–112)
Creatinine, Ser: 0.7 mg/dL (ref 0.4–1.2)
GFR: 91.3 mL/min (ref >60.00)
Glucose, Bld: 106 mg/dL — ABNORMAL HIGH (ref 70–99)
Total Bilirubin: 0.5 mg/dL (ref 0.3–1.2)

## 2011-12-13 LAB — CBC WITH DIFFERENTIAL/PLATELET
Basophils Absolute: 0 10*3/uL (ref 0.0–0.1)
Eosinophils Relative: 0.5 % (ref 0.0–5.0)
HCT: 39 % (ref 36.0–46.0)
Hemoglobin: 13.2 g/dL (ref 12.0–15.0)
Lymphocytes Relative: 22.6 % (ref 12.0–46.0)
Lymphs Abs: 1.8 10*3/uL (ref 0.7–4.0)
Monocytes Relative: 4.9 % (ref 3.0–12.0)
Neutro Abs: 5.8 10*3/uL (ref 1.4–7.7)
RDW: 13.5 % (ref 11.5–14.6)
WBC: 8 10*3/uL (ref 4.5–10.5)

## 2011-12-13 LAB — TSH: TSH: 0.76 u[IU]/mL (ref 0.35–5.50)

## 2011-12-13 LAB — SEDIMENTATION RATE: Sed Rate: 34 mm/hr — ABNORMAL HIGH (ref 0–22)

## 2011-12-13 NOTE — Assessment & Plan Note (Signed)
On po supplements Labs

## 2011-12-13 NOTE — Assessment & Plan Note (Signed)
Heavy smoker w/ chest wall dyscomfort and wt loss Plan: CXR, if negative consider HR CT chest to r/o malignancy

## 2011-12-13 NOTE — Progress Notes (Signed)
  Subjective:    Patient ID: [name] [name], female    DOB: 1956-08-04, 56 y.o.   MRN: [id number]  HPI Needs to be seen for several issues. Right side, on and off chest pain for several months, does not increase with moving her torso, taking a deep breath. Sometimes worse with cough. Denies any injury or rash. Since October 2012 has lost 17 pounds, has not changed anything on her diet and exercise. Patient concerned. See review of systems. Still smoking, is going through a cessation program but not really convinced that the program will work for her.  Past Medical History: Asthma,adult onset COPD, per CT  11/10/05, with minimal bronchiectasis rll GERD Osteoarthritis Osteoporosis--Dexa per gyn Endometriosis Recurrent isch colitis Tobacco abuse Headaches LICHEN PLANUS   Hx of B12 DEFICIENCY   Cataracts  Past Surgical History: Knee surgeries x 8 ovarian cyst removal  Social History: Single, no children mom and sister live in her house  Works at [employer]  Current smoker since age 33.  Smokes 1 ppd at present   Drinks wine daily   Review of Systems No fever or chills No night sweats Had episode of bloody diarrhea, she thinks due to ischemic colitis back in May and September last year. No further episodes. No abdominal pain, nausea or vomiting. Denies depression or anxiety, appetite is normal. No extremity edema Had cough and fever 2 months ago, self prescribed Avelox, doing well since then.    Objective:   Physical Exam  Constitutional: She is oriented to person, place, and time. She appears well-developed and well-nourished.  HENT:  Head: Normocephalic and atraumatic.  Neck:       Neck without lymphadenopathies, no supraclavicular masses. No thyromegaly  Cardiovascular: Normal rate, regular rhythm and normal heart sounds.   No murmur heard. Pulmonary/Chest: Effort normal and breath sounds normal. No respiratory distress. She has no wheezes. She has no rales.     Abdominal: Soft. She exhibits no distension. There is no tenderness. There is no rebound and no guarding.  Genitourinary:       Breast exam bilaterally without dominant mass, no axillary lymphadenopathy  Musculoskeletal: She exhibits no edema.  Neurological: She is alert and oriented to person, place, and time.  Psychiatric: She has a normal mood and affect. Her behavior is normal. Judgment and thought content normal.      Assessment & Plan:

## 2011-12-13 NOTE — Assessment & Plan Note (Signed)
Unexplained wt loss, ROS neg, exam benign Plan: labs, reasses in 4 week

## 2011-12-14 ENCOUNTER — Encounter: Payer: Self-pay | Admitting: Internal Medicine

## 2011-12-16 ENCOUNTER — Encounter: Payer: Self-pay | Admitting: Internal Medicine

## 2011-12-29 ENCOUNTER — Ambulatory Visit (HOSPITAL_COMMUNITY)
Admission: RE | Admit: 2011-12-29 | Discharge: 2011-12-29 | Disposition: A | Discharge status: Home / Self Care | Payer: 59 | Source: Ambulatory Visit | Attending: Internal Medicine | Admitting: Internal Medicine

## 2011-12-29 ENCOUNTER — Telehealth: Payer: Self-pay | Admitting: Internal Medicine

## 2011-12-29 DIAGNOSIS — R9389 Abnormal findings on diagnostic imaging of other specified body structures: Secondary | ICD-10-CM

## 2011-12-29 DIAGNOSIS — F17200 Nicotine dependence, unspecified, uncomplicated: Secondary | ICD-10-CM | POA: Insufficient documentation

## 2011-12-29 DIAGNOSIS — R0789 Other chest pain: Secondary | ICD-10-CM | POA: Insufficient documentation

## 2011-12-29 NOTE — Telephone Encounter (Signed)
Chest x-ray showed a new cavitary lesions. Plan: Pulmonary referral CT with contrast We'll call the patient and let her know.

## 2011-12-29 NOTE — Telephone Encounter (Signed)
The patient, she is aware of. Likes to proceed with  The CT but hold the pulmonary referral until the CT is done

## 2011-12-29 NOTE — Telephone Encounter (Signed)
LMOM at work [phone]

## 2012-01-04 ENCOUNTER — Ambulatory Visit (HOSPITAL_COMMUNITY)
Admission: RE | Admit: 2012-01-04 | Discharge: 2012-01-04 | Disposition: A | Discharge status: Home / Self Care | Payer: 59 | Source: Ambulatory Visit | Attending: Internal Medicine | Admitting: Internal Medicine

## 2012-01-04 ENCOUNTER — Telehealth: Payer: Self-pay | Admitting: Internal Medicine

## 2012-01-04 ENCOUNTER — Other Ambulatory Visit: Payer: 59

## 2012-01-04 DIAGNOSIS — A159 Respiratory tuberculosis unspecified: Secondary | ICD-10-CM

## 2012-01-04 DIAGNOSIS — R9389 Abnormal findings on diagnostic imaging of other specified body structures: Secondary | ICD-10-CM

## 2012-01-04 DIAGNOSIS — J438 Other emphysema: Secondary | ICD-10-CM | POA: Insufficient documentation

## 2012-01-04 DIAGNOSIS — R079 Chest pain, unspecified: Secondary | ICD-10-CM | POA: Insufficient documentation

## 2012-01-04 MED ORDER — IOHEXOL 300 MG/ML  SOLN
80.0000 mL | Freq: Once | INTRAMUSCULAR | Status: AC | PRN
  Administered 2012-01-04: 80 mL via INTRAVENOUS

## 2012-01-04 NOTE — Telephone Encounter (Signed)
Spoke with ID as the DDX includes TB, based on the clinical picture we don't need to immediate isolation but they do need to see her in a timely manner. I spoke with the patient as well, she is aware of the referral.

## 2012-01-04 NOTE — Telephone Encounter (Signed)
The radiologist called me, the CT confirmed a thick-wall cavitary lesions in the lung, primary consideration is TB, ? small mycetoma. Second consideration is carcinoma but is less likely. Plan: Refer to ID ASAP We'll discuss with them also need to start isolation until TB is rule out. Will call pt

## 2012-01-05 ENCOUNTER — Telehealth: Payer: Self-pay | Admitting: Internal Medicine

## 2012-01-05 DIAGNOSIS — R911 Solitary pulmonary nodule: Secondary | ICD-10-CM

## 2012-01-05 NOTE — Telephone Encounter (Signed)
I think going to a pulmonologist is perfectly appropriate,  I do recommend a Adolph Pollack pulmonologist. I entered a referral, please let the patient know

## 2012-01-05 NOTE — Telephone Encounter (Signed)
Pt would like to know if she can cancel the infection disease and see pulmonologist or does she need to see both. Pt prefers only to see one specialist if possible due to limited money. Please advise  Pt would not like to see Dr Shelle Iron if possible when referring to pulm.

## 2012-01-05 NOTE — Telephone Encounter (Signed)
Yes, please cancel ID referral I already entered the pulmonology referral

## 2012-01-05 NOTE — Telephone Encounter (Signed)
Discuss with patient  

## 2012-01-05 NOTE — Telephone Encounter (Signed)
Patient states that she does not have the money to keep going to several different doctors. She would like to know if a Pulmonologist would be able to help her with everything that is going on?

## 2012-01-05 NOTE — Telephone Encounter (Signed)
Addended by: Candie Echevaria L on: 01/05/2012 04:20 PM   Modules accepted: Orders

## 2012-01-05 NOTE — Telephone Encounter (Signed)
Left message to call office

## 2012-01-17 ENCOUNTER — Institutional Professional Consult (permissible substitution): Payer: 59 | Admitting: Critical Care Medicine

## 2012-01-19 ENCOUNTER — Ambulatory Visit: Payer: 59 | Admitting: Internal Medicine

## 2012-01-20 ENCOUNTER — Encounter: Payer: Self-pay | Admitting: Internal Medicine

## 2012-01-20 ENCOUNTER — Telehealth: Payer: Self-pay | Admitting: Internal Medicine

## 2012-01-20 ENCOUNTER — Ambulatory Visit (INDEPENDENT_AMBULATORY_CARE_PROVIDER_SITE_OTHER): Payer: 59 | Admitting: Internal Medicine

## 2012-01-20 DIAGNOSIS — J479 Bronchiectasis, uncomplicated: Secondary | ICD-10-CM

## 2012-01-20 DIAGNOSIS — F17200 Nicotine dependence, unspecified, uncomplicated: Secondary | ICD-10-CM

## 2012-01-20 NOTE — Telephone Encounter (Signed)
I spoke with pt and she wanted to know if bronchiectasis was contagious. I advised her it was not. She then stated her sister can;t get off work for her to schedule a bronchoscopy and may not be able to schedule this until June. She stated she iwll try to find someone so she can get it scheduled sooner. She will call when she can get this scheduled Will forward to MW as an Financial planner

## 2012-01-20 NOTE — Telephone Encounter (Signed)
LMTCB x 1 

## 2012-01-20 NOTE — Progress Notes (Signed)
  Subjective:    Patient ID: [name] [name], female    DOB: 09/10/1956  MRN: [id number]  HPI  77 yowf  with h/o COPD/bronchiectasis complicated by pna 12/06 seen last in pulmonary clinic in 2008 And  Referred back  to pulmonary clinic 01/20/2012 for cavitary mass RUL and h/o bronchiectasis involving the RLL.  01/20/2012 1st pulmonary eval still smoking  cc R sided lat gen chest/upper abd pain starting fall of 2012 indolent onset daily but not nightly but and minimally  progressively  Moderately worse, not pleuritic.   Intermittently coughing up yellow/ green mucus with assoc with a different type of cp= R post pleuritic cp took her own amox/avelox and improved by Tgiving almost completely resolved with no hemoptysis but f/u cxr abn > ct chest done and referred to [facility] clinic.  Presently no sputum fever or NS  Sleeping ok without nocturnal  or early am exacerbation  of respiratory  c/o's or need for noct saba. Also denies any obvious fluctuation of symptoms with weather or environmental changes or other aggravating or alleviating factors except as outlined above.   Review of Systems  Constitutional: Positive for appetite change and unexpected weight change. Negative for fever and chills.  HENT: Negative for ear pain, nosebleeds, congestion, sore throat, rhinorrhea, sneezing, trouble swallowing, dental problem, voice change, postnasal drip and sinus pressure.   Eyes: Negative for visual disturbance.  Respiratory: Positive for cough. Negative for choking and shortness of breath.   Cardiovascular: Negative for chest pain and leg swelling.  Gastrointestinal: Negative for vomiting, abdominal pain and diarrhea.  Genitourinary: Negative for difficulty urinating.  Musculoskeletal: Positive for arthralgias.  Skin: Negative for rash.  Neurological: Negative for tremors, syncope and headaches.  Hematological: Does not bruise/bleed easily.       Objective:   Physical Exam  Anxious wf nad Wt 120  01/20/12 HEENT mild turbinate edema.  Oropharynx no thrush or excess pnd or cobblestoning.  No JVD or cervical adenopathy. Mild accessory muscle hypertrophy. Trachea midline, nl thryroid. Chest was hyperinflated by percussion with diminished breath sounds and mild increased exp time without wheeze. Hoover sign positive at mid inspiration. Regular rate and rhythm without murmur gallop or rub or increase P2 or edema.  Abd: no hsm, nl excursion. Ext warm without cyanosis ? slt clubbing    11/10/2005 1. Probable cystic bronchiectasis in the right lower lobe with mucous filled dilated bronchi and patchy areas of peripheral infiltrate in the right lower lobe.  2. Emphysematous changes and probable chronic pleural thickening of the apices, right greater than left.     12/29/11 Irregular shaped thick-walled cavitary lesion in the apex of  the right upper lobe that measures at least 6.5 x 6.0 x 5.3 cm.  The appearance of this lesion is aggressive, but nonspecific, and  differential considerations include sequelae of infection  (including atypical organisms such as mycobacterium tuberculosis),  or even a thick walled cavitary neoplasm. In the inferior aspect  of the lesion (image 18 of series 3) there is a focal area of soft  tissue prominence with a "air crescent" sign, that may either  represent a focal soft tissue nodule, or may represent  superinfection with organisms such as aspergillus     Assessment & Plan:

## 2012-01-20 NOTE — Patient Instructions (Signed)
Bronchiectasis =   you have scarring of your bronchial tubes which means that they don't function perfectly normally and mucus tends to pool in certain areas of your lung which can cause pneumonia and further scarring of your lung and bronchial tubes.  Whenever you develop cough congestion take mucinex or mucinex dm > these will help keep the mucus loose and flowing but if your condition worsens you need to seek help immediately preferably here or somewhere inside the [facility] system to compare xrays ( worse = darker or bloody mucus or pain on breathing in).   Most important aspect of your care is to stop smoking.   I recommend bronchoscopy to see what the cause of the cavity in your upper lobe is - you will to come to outpatient registration @ [facility] @  730 am  And nothing to eat after midnight and skip aspirin x 3 days.

## 2012-01-21 NOTE — Assessment & Plan Note (Signed)
New rul changes on ct correlated to transient R post pleuritic cp and purulent sputum better since abx but agree with radiology concern re aspergilosis, atypical TB and lung ca.  TB very unlikely with multiple neg IPPD's and clinical improvement p abx.  Discussed in detail all the  indications, usual  risks and alternatives  relative to the benefits with patient who agrees to proceed with bronchoscopy with biopsy but wants to post pone it for the time being.

## 2012-01-21 NOTE — Assessment & Plan Note (Signed)
I emphasized that although we never turn away smokers from the pulmonary clinic, we do ask that they understand that the recommendations that we make  won't work nearly as well in the presence of continued cigarette exposure.  In fact, we may very well  reach a point where we can't promise to help the patient if he/she can't quit smoking. (We can and will promise to try to help, we just can't promise what we recommend will really work)  

## 2012-01-25 ENCOUNTER — Telehealth: Payer: Self-pay | Admitting: Internal Medicine

## 2012-01-25 NOTE — Telephone Encounter (Signed)
lmtcb

## 2012-01-25 NOTE — Telephone Encounter (Signed)
Spoke with pt and advised that it depends on the amount of time that it takes her to recover, probably should count on being gone for a few hours. Pt verbalized understanding. States that she would like to have the bronch done on April 4th 2013 if this is possible. Am appt preferred. I advised that we will send msg to MW to see if this date is good for him. Please advise, thanks!

## 2012-01-25 NOTE — Telephone Encounter (Signed)
Ok with me 

## 2012-01-26 NOTE — Telephone Encounter (Signed)
Returning call.

## 2012-01-26 NOTE — Telephone Encounter (Signed)
Spoke with pt and advised her this date is fine and she still has her instruction sheet from ov explaining time and location of the procedure. She is aware NPO after midnight the night before and no ASA 3 days prior.

## 2012-02-02 ENCOUNTER — Institutional Professional Consult (permissible substitution): Payer: 59 | Admitting: Internal Medicine

## 2012-02-03 ENCOUNTER — Ambulatory Visit (INDEPENDENT_AMBULATORY_CARE_PROVIDER_SITE_OTHER): Payer: 59 | Admitting: Internal Medicine

## 2012-02-03 VITALS — HR 87 | Temp 98.4°F | Wt 123.0 lb

## 2012-02-03 DIAGNOSIS — J479 Bronchiectasis, uncomplicated: Secondary | ICD-10-CM

## 2012-02-03 DIAGNOSIS — R634 Abnormal weight loss: Secondary | ICD-10-CM

## 2012-02-03 DIAGNOSIS — R071 Chest pain on breathing: Secondary | ICD-10-CM

## 2012-02-03 DIAGNOSIS — F17200 Nicotine dependence, unspecified, uncomplicated: Secondary | ICD-10-CM

## 2012-02-03 DIAGNOSIS — R0789 Other chest pain: Secondary | ICD-10-CM

## 2012-02-03 NOTE — Assessment & Plan Note (Addendum)
Abnormal CT of the chest a few weeks ago, she already saw pulmonary, scheduled to have a bronchoscopy in April. The patient asked any if she should do that sooner. My answer was absolutely yes as the differential diagnosis includes lung cancer. Pt verbalized understanding, plans to request to have the procedure sooner than April, rec to call my id needs help

## 2012-02-03 NOTE — Assessment & Plan Note (Signed)
Pain is still there, no better or worse. It is probably related to they r apex lung lesions.

## 2012-02-03 NOTE — Assessment & Plan Note (Signed)
Has gained 2 pounds.

## 2012-02-03 NOTE — Progress Notes (Signed)
  Subjective:    Patient ID: [name] [name], female    DOB: 24-Jan-1956, 56 y.o.   MRN: [id number]  HPI Followup visit She was recently seen with chest pain and an abnormal chest x-ray, followup CT show a lesion at the  Right apex. She saw pulmonary, she is to schedule to have a bronchoscopy in April. Patient is concerned because she continued to have pain on the right side of the chest. Also she likes my opinion as far as how quickly she should have a bronchoscopy Again the chest pain is on the right lateral chest, not worse by coughing or taking a deep breath, rather decreased a little when she elevated her right arm.  Past Medical History:  Asthma,adult onset  COPD, per CT 11/10/05, with minimal bronchiectasis rll  GERD  Osteoarthritis  Osteoporosis--Dexa per gyn  Endometriosis  Recurrent isch colitis  Tobacco abuse  Headaches  LICHEN PLANUS  Hx of B12 DEFICIENCY  Cataracts  Past Surgical History:  Knee surgeries x 8  ovarian cyst removal  Social History:  Single, no children  mom and sister live in her house  Works at [employer]  Current smoker since age 26. Still smokes  Drinks wine daily    Review of Systems No fever chills. Cough is back to baseline after antibiotics, occasionally see yellow sputum without blood. She continue to smoke     Objective:   Physical Exam Alert oriented in no apparent distress Neck without mass or supraclavicular lymphadenopathy Lungs, decreased breath sounds otherwise clear       Assessment & Plan:  Today , I spent more than 25  min with the patient, >50% of the time counseling, see a/p

## 2012-02-03 NOTE — Assessment & Plan Note (Signed)
Strongly recommend her to quit

## 2012-02-05 ENCOUNTER — Encounter: Payer: Self-pay | Admitting: Internal Medicine

## 2012-02-10 ENCOUNTER — Telehealth: Payer: Self-pay | Admitting: Internal Medicine

## 2012-02-10 NOTE — Telephone Encounter (Signed)
Returning call can be reached at [phone].Raylene Everts

## 2012-02-10 NOTE — Telephone Encounter (Signed)
Pt called back to add the following: if calling today call cell [phone]. If calling Monday 02-13-12 call work # [phone].

## 2012-02-10 NOTE — Telephone Encounter (Signed)
Spoke with Patient would like to move Bronch up to the AM on 3/13 if possible, friend from work volunteered to provide transportation to and from the procedure. Dr. Sherene Sires please advise if this is possible.

## 2012-02-10 NOTE — Telephone Encounter (Signed)
Sara at resp. Dept called- pt has bronch scheduled for wed 02-15-12 at 9am "with fluoro". Please put this order in today asap per caller. Thanks.

## 2012-02-10 NOTE — Telephone Encounter (Signed)
Ok for 3/13 be at [facility] outpatient registration at 730 am and nothing to eat p mn on Wed

## 2012-02-10 NOTE — Telephone Encounter (Signed)
Called # provided above - received pt's VM - [email or web address]

## 2012-02-10 NOTE — Telephone Encounter (Signed)
Will forward to MW for the order per Budd Palmer.

## 2012-02-10 NOTE — Telephone Encounter (Signed)
Discussed by phone with pt in detail all the  indications, usual  risks and alternatives  relative to the benefits with patient who agrees to proceed with bronchoscopy with biopsy on 3/13

## 2012-02-14 ENCOUNTER — Encounter (HOSPITAL_COMMUNITY): Payer: Self-pay

## 2012-02-15 ENCOUNTER — Ambulatory Visit (HOSPITAL_COMMUNITY)
Admission: RE | Admit: 2012-02-15 | Discharge: 2012-02-15 | Disposition: A | Discharge status: Home / Self Care | Payer: 59 | Source: Ambulatory Visit | Attending: Internal Medicine | Admitting: Internal Medicine

## 2012-02-15 ENCOUNTER — Encounter (HOSPITAL_COMMUNITY)
Admission: RE | Disposition: A | Discharge status: Home / Self Care | Payer: Self-pay | Source: Ambulatory Visit | Attending: Internal Medicine

## 2012-02-15 ENCOUNTER — Ambulatory Visit (HOSPITAL_COMMUNITY): Payer: 59

## 2012-02-15 ENCOUNTER — Encounter (HOSPITAL_COMMUNITY): Payer: 59

## 2012-02-15 ENCOUNTER — Encounter (HOSPITAL_COMMUNITY): Payer: Self-pay | Admitting: Radiology

## 2012-02-15 DIAGNOSIS — R222 Localized swelling, mass and lump, trunk: Secondary | ICD-10-CM | POA: Insufficient documentation

## 2012-02-15 DIAGNOSIS — J438 Other emphysema: Secondary | ICD-10-CM | POA: Insufficient documentation

## 2012-02-15 HISTORY — PX: VIDEO BRONCHOSCOPY: SHX5072

## 2012-02-15 SURGERY — BRONCHOSCOPY, WITH FLUOROSCOPY
Anesthesia: Moderate Sedation | Laterality: Bilateral

## 2012-02-15 MED ORDER — SODIUM CHLORIDE 0.9 % IV SOLN
INTRAVENOUS | Status: DC
  Administered 2012-02-15: 09:00:00 via INTRAVENOUS

## 2012-02-15 MED ORDER — PHENYLEPHRINE HCL 0.25 % NA SOLN
1.0000 | Freq: Four times a day (QID) | NASAL | Status: DC | PRN
  Administered 2012-02-15: 1 via NASAL
  Filled 2012-02-15: qty 15

## 2012-02-15 MED ORDER — MIDAZOLAM HCL 10 MG/2ML IJ SOLN: 10.0000 mg | Freq: Once | INTRAMUSCULAR | Status: DC

## 2012-02-15 MED ORDER — MEPERIDINE HCL 100 MG/ML IJ SOLN: 100.0000 mg | Freq: Once | INTRAMUSCULAR | Status: DC

## 2012-02-15 MED ORDER — MIDAZOLAM HCL 10 MG/2ML IJ SOLN
INTRAMUSCULAR | Status: AC
  Filled 2012-02-15: qty 4

## 2012-02-15 MED ORDER — MEPERIDINE HCL 25 MG/ML IJ SOLN
INTRAMUSCULAR | Status: DC | PRN
  Administered 2012-02-15: 50 mg via INTRAVENOUS

## 2012-02-15 MED ORDER — LIDOCAINE HCL 2 % EX GEL
Freq: Once | CUTANEOUS | Status: AC
  Administered 2012-02-15: 08:00:00 via TOPICAL

## 2012-02-15 MED ORDER — MIDAZOLAM HCL 10 MG/2ML IJ SOLN
INTRAMUSCULAR | Status: DC | PRN
  Administered 2012-02-15 (×2): 2.5 mg via INTRAVENOUS
  Administered 2012-02-15: 5 mg via INTRAVENOUS

## 2012-02-15 MED ORDER — MEPERIDINE HCL 100 MG/ML IJ SOLN
INTRAMUSCULAR | Status: AC
  Filled 2012-02-15: qty 2

## 2012-02-15 NOTE — Discharge Instructions (Signed)
Bronchoscopy Care After These instructions give you information on caring for yourself after your procedure. Your doctor may also give you specific instructions. Call your doctor if you have any problems or questions after your procedure. HOME CARE  Do not eat or drink anything for 2 hours after your test. Your nose and throat was numbed by medicine. If you try to eat or drink before the medicine wears off, food or drink could go into your lungs.   For the rest of the first day, eat soft food and drink liquids slowly.   On the day after the test, you can go back to eating your usual food.   Do not drive or sign important papers the day of the test.   Take it easy for the next 2 days. Do not do any heavy work, exercise, or activities.   Only take medicine as told by your doctor. Do not take aspirin.   You may be drowsy for the next 24 hours.   You may see traces of blood in your spit for 1 to 2 days.  Finding out the results of your test Ask when your test results will be ready. Make sure you get your test results. GET HELP RIGHT AWAY IF:  You have breathing problems.   You have a bad sore throat for more than 1 week.   You see traces of blood in your spit for more than 3 days.   You start coughing up blood.   You have a temperature of 102 F (38.9 C) or higher.  MAKE SURE YOU:  Understand these instructions.   Will watch your condition.   Will get help right away if you are not doing well or get worse.  Document Released: 09/18/2009 Document Revised: 11/10/2011 Document Reviewed: 09/18/2009 [facility] Patient Information 2012 [place], [place].   Dr Sherene Sires will call when results from biopsy are available

## 2012-02-15 NOTE — Progress Notes (Signed)
Pt dicharged.

## 2012-02-15 NOTE — Interval H&P Note (Signed)
History and Physical Interval Note:  02/15/2012 9:23 AM  [name] [name]  has presented today for surgery, with the diagnosis of abnormal xray  The various methods of treatment have been discussed with the patient and family. After consideration of risks, benefits and other options for treatment, the patient has consented to  Procedure(s) (LRB): VIDEO BRONCHOSCOPY WITH FLUORO (Bilateral) as a surgical intervention .  The patients' history has been reviewed, patient examined, no change in status, stable for surgery.  I have reviewed the patients' chart and labs.  Questions were answered to the patient's satisfaction.     Sandrea Hughs

## 2012-02-15 NOTE — H&P (View-Only) (Signed)
  Subjective:    Patient ID: [name] [name], female    DOB: 11/02/1956  MRN: [id number]  HPI  55 yowf  with h/o COPD/bronchiectasis complicated by pna 12/06 seen last in pulmonary clinic in 2008 And  Referred back  to pulmonary clinic 01/20/2012 for cavitary mass RUL and h/o bronchiectasis involving the RLL.  01/20/2012 1st pulmonary eval still smoking  cc R sided lat gen chest/upper abd pain starting fall of 2012 indolent onset daily but not nightly but and minimally  progressively  Moderately worse, not pleuritic.   Intermittently coughing up yellow/ green mucus with assoc with a different type of cp= R post pleuritic cp took her own amox/avelox and improved by Tgiving almost completely resolved with no hemoptysis but f/u cxr abn > ct chest done and referred to pulnary clinic.  Presently no sputum fever or NS  Sleeping ok without nocturnal  or early am exacerbation  of respiratory  c/o's or need for noct [name]. Also denies any obvious fluctuation of symptoms with weather or environmental changes or other aggravating or alleviating factors except as outlined above.   Review of Systems  Constitutional: Positive for appetite change and unexpected weight change. Negative for fever and chills.  HENT: Negative for ear pain, nosebleeds, congestion, sore throat, rhinorrhea, sneezing, trouble swallowing, dental problem, voice change, postnasal drip and sinus pressure.   Eyes: Negative for visual disturbance.  Respiratory: Positive for cough. Negative for choking and shortness of breath.   Cardiovascular: Negative for chest pain and leg swelling.  Gastrointestinal: Negative for vomiting, abdominal pain and diarrhea.  Genitourinary: Negative for difficulty urinating.  Musculoskeletal: Positive for arthralgias.  Skin: Negative for rash.  Neurological: Negative for tremors, syncope and headaches.  Hematological: Does not bruise/bleed easily.       Objective:   Physical Exam  Anxious wf nad Wt 120  01/20/12 HEENT mild turbinate edema.  Oropharynx no thrush or excess pnd or cobblestoning.  No JVD or cervical adenopathy. Mild accessory muscle hypertrophy. Trachea midline, nl thryroid. Chest was hyperinflated by percussion with diminished breath sounds and mild increased exp time without wheeze. Hoover sign positive at mid inspiration. Regular rate and rhythm without murmur gallop or rub or increase P2 or edema.  Abd: no hsm, nl excursion. Ext warm without cyanosis ? slt clubbing    11/10/2005 1. Probable cystic bronchiectasis in the right lower lobe with mucous filled dilated bronchi and patchy areas of peripheral infiltrate in the right lower lobe.  2. Emphysematous changes and probable chronic pleural thickening of the apices, right greater than left.     12/29/11 Irregular shaped thick-walled cavitary lesion in the apex of  the right upper lobe that measures at least 6.5 x 6.0 x 5.3 cm.  The appearance of this lesion is aggressive, but nonspecific, and  differential considerations include sequelae of infection  (including atypical organisms such as mycobacterium tuberculosis),  or even a thick walled cavitary neoplasm. In the inferior aspect  of the lesion (image 18 of series 3) there is a focal area of soft  tissue prominence with a "air crescent" sign, that may either  represent a focal soft tissue nodule, or may represent  superinfection with organisms such as aspergillus     Assessment & Plan:   

## 2012-02-15 NOTE — Op Note (Signed)
Bronchoscopy Procedure Note  Date of Operation: 02/15/2012  Pre-op Diagnosis: lung mass  Post-op Diagnosis: lung maas  Surgeon: Sandrea Hughs  Anesthesia: Monitored Local Anesthesia with Sedation  Operation: Flexible Video fiberoptic bronchoscopy, diagnostic   Findings: nl airways  Specimen: RUL Transbronchial bx and washings  Estimated Blood Loss: Minimal  Complications: None apparent  Indications and History: The patient is a 56 y.o. female with asympt RUL cavitary mass.  The risks, benefits, complications, treatment options and expected outcomes were discussed with the patient.  The possibilities of reaction to medication, pulmonary aspiration, perforation of a viscus, bleeding, failure to diagnose a condition and creating a complication requiring transfusion or operation were discussed with the patient who freely signed the consent.    Description of Procedure: The patient was re-examined in the bronchoscopy suite and the site of surgery properly noted/marked.  The patient was identified as [name] [name] and the procedure verified as Flexible Fiberoptic Bronchoscopy.  A Time Out was held and the above information confirmed.   After the induction of topical nasopharyngeal anesthesia, the patient was positioned  and the bronchoscope was passed through the R  naris. The vocal cords were visualized and  1% buffered lidocaine 5 ml was topically placed onto the cords. The cords were nl in structure and mobility. The scope was then passed into the trachea.  1% buffered lidocaine 5 ml was used topically on the carina.  Careful inspection of the tracheal lumen was accomplished. The scope was sequentially passed into the left main and then left upper and lower bronchi and segmental bronchi.  All airways were nl to the subsegmental level  Procedure  Using a wedge position in the RUL post segment and fluoro guidance   Transbronchial bx and washings RUL were done and there was adequate  specimen collection.     Attestation: I performed the procedure.  Verl Bangs, MD Pulmonary and Critical Care Medicine [facility] Cell [phone]

## 2012-02-17 ENCOUNTER — Telehealth: Payer: Self-pay | Admitting: Internal Medicine

## 2012-02-17 ENCOUNTER — Encounter (HOSPITAL_COMMUNITY): Payer: Self-pay | Admitting: Internal Medicine

## 2012-02-17 NOTE — Telephone Encounter (Signed)
Pt called back and Nicholos Johns advised her to go to med rec downstairs and they can give her any records that she is needing.

## 2012-02-17 NOTE — Telephone Encounter (Signed)
She should come by med rec dept and sign release for records.  LMTCB

## 2012-02-18 LAB — CULTURE, RESPIRATORY W GRAM STAIN: Gram Stain: NONE SEEN

## 2012-02-24 ENCOUNTER — Telehealth: Payer: Self-pay | Admitting: Internal Medicine

## 2012-02-24 NOTE — Telephone Encounter (Signed)
Placed in MW lookat

## 2012-02-29 ENCOUNTER — Telehealth: Payer: Self-pay | Admitting: Internal Medicine

## 2012-02-29 DIAGNOSIS — R918 Other nonspecific abnormal finding of lung field: Secondary | ICD-10-CM

## 2012-02-29 NOTE — Telephone Encounter (Signed)
Discussed preliminary results, rec pet scan and eval by Tyrone Sage for possible rulobectomy  Discussed in detail all the  indications, usual  risks and alternatives  relative to the benefits with patient who agrees to proceed with plan as outlined

## 2012-03-01 ENCOUNTER — Telehealth: Payer: Self-pay | Admitting: *Deleted

## 2012-03-01 DIAGNOSIS — R911 Solitary pulmonary nodule: Secondary | ICD-10-CM

## 2012-03-01 NOTE — Telephone Encounter (Signed)
Pt left VM requesting that Dr Drue Novel review Dr Sherene Sires recent notes and advise her on what the next step should be. Pt would like for Dr Drue Novel to call her directly.

## 2012-03-05 ENCOUNTER — Encounter: Payer: Self-pay | Admitting: Internal Medicine

## 2012-03-05 NOTE — Telephone Encounter (Signed)
Fine to refer to T surgery - we will take care of this

## 2012-03-05 NOTE — Telephone Encounter (Signed)
Spoke with the patient, she cancelled the PET scan, she's not sure if these procedure will clarify her diagnosis. She is reluctant to have surgery as well. I recommend her to see thoracic surgery, even if she decides not to have the PET scan I think it's worth for her to talk to them about her situation. We will forward this phone note  to Dr. Sherene Sires, hopefully he will refer her to thoracic surgery. JP

## 2012-03-06 NOTE — Telephone Encounter (Signed)
THX!

## 2012-03-06 NOTE — Telephone Encounter (Signed)
Order was sent to our [facility] for referal to T Surgery.  Msg forwarded back to Dr. Drue Novel so he is aware.

## 2012-03-07 ENCOUNTER — Telehealth: Payer: Self-pay

## 2012-03-07 NOTE — Telephone Encounter (Signed)
Ok to refer.

## 2012-03-07 NOTE — Telephone Encounter (Signed)
I spoke with pt and is aware. Nothing further was needed 

## 2012-03-07 NOTE — Telephone Encounter (Signed)
I already placed order for referral 03/06/12. Spoke with [facility] and advised the pt prefers Dr. Tyrone Sage.  LMTCB to advise pt this has been done and [facility] will contact her.

## 2012-03-07 NOTE — Telephone Encounter (Signed)
I spoke with pt and she stated that Dr. Sherene Sires and mentioned a referral to Dr. Tyrone Sage for 2nd opinion. She stated after talking to Dr. Drue Novel Monday night she states he agree 'd she should go see him as well. Pt is requesting a referral to Dr. Tyrone Sage and stated once we send the referral she will call for the apt. Please advise Dr. Sherene Sires, thanks

## 2012-03-09 ENCOUNTER — Other Ambulatory Visit (HOSPITAL_COMMUNITY): Payer: 59

## 2012-03-12 ENCOUNTER — Institutional Professional Consult (permissible substitution) (INDEPENDENT_AMBULATORY_CARE_PROVIDER_SITE_OTHER): Payer: 59 | Admitting: Surgery

## 2012-03-12 ENCOUNTER — Encounter: Payer: Self-pay | Admitting: Surgery

## 2012-03-12 ENCOUNTER — Other Ambulatory Visit: Payer: Self-pay | Admitting: Surgery

## 2012-03-12 VITALS — BP 118/72 | HR 86 | Temp 97.2°F | Resp 16 | Ht 67.0 in | Wt 121.0 lb

## 2012-03-12 DIAGNOSIS — R222 Localized swelling, mass and lump, trunk: Secondary | ICD-10-CM

## 2012-03-12 DIAGNOSIS — R918 Other nonspecific abnormal finding of lung field: Secondary | ICD-10-CM | POA: Insufficient documentation

## 2012-03-12 DIAGNOSIS — D381 Neoplasm of uncertain behavior of trachea, bronchus and lung: Secondary | ICD-10-CM

## 2012-03-12 NOTE — Progress Notes (Signed)
[address]            [name] [address]          [phone]      PCP is Willow Ora, MD, MD Referring Provider is Sandrea Hughs, MD  Chief Complaint  Patient presents with  . Lung Mass    CT CHEST 1/30 pt to discuss cx  PET with  doctor    HPI:  The patient is a 56 year old white female smoker with a history of COPD and bronchiectasis who reports having a pulmonary illness last fall manifested by fever, chills, coughing up foul-smelling sputum, and general malaise. She did not have health insurance at that time and did not seek further workup. She said that she took a brief course of antibiotics that she already has at home including amoxicillin and Avelox for a total of about 7 days. She said that she gradually improved. She said that she finally got health insurance as of January 2013 and proceeded with workup by Dr. Drue Novel. Chest x-ray showed a new large cavitary mass in the right lung apex. A CT scan of the chest likewise showed a 6.5 x 6.0 cm thick walled, irregular, cavitary lesion in the right lung apex with multiple thick internal septations and irregular nodular walls. It was felt that this could represent a mycetoma or a cavitary neoplasm. In the lateral aspect of the right upper lobe there is a more focal pleural-based mass-like opacity that had a few small internal air bronchograms suggesting an area of air space consolidation rather than a true mass. She reports that over the past few months she has had significant dull pain over the right anterior chest as well as over the right rib cage laterally. This has not been made worse by any specific movement or coughing. She notes no change in her voice. She has had no numbness or weakness in her right upper extremity. She said that she is no longer coughing up any sputum. She has had no recent fever or chills. She does report a 20 weight loss since last fall which she attributes to decreased appetite. She  continues to smoke about 15-20 cigarettes per day but has much more in the past. She underwent flexible fiberoptic bronchoscopy by Dr.Wert which showed no visible abnormality within the tracheobronchial tree. Biopsies of the right upper lobe showed no evidence of malignancy or fungus. Cytology of washings showed no malignant cells. Culture of the right upper lobe showed only normal flora. Gram stain showed no white blood cells and no organisms.  Past Medical History  Diagnosis Date  . Asthma     adult onset  . COPD (chronic obstructive pulmonary disease)     per CT 11/10/05, will minimal bronchiectasis rll  . GERD (gastroesophageal reflux disease)   . Osteoporosis     dexa per gyn  . 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 Headache   . Lichen planus   . B12 deficiency   . Endometriosis   . Cataracts, bilateral     Past Surgical History  Procedure Date  . Knee surgery     x 8  . Ovarian cyst removed   . Video bronchoscopy 02/15/2012    Procedure: VIDEO BRONCHOSCOPY WITH FLUORO;  Surgeon: Nyoka Cowden, MD;  Location: [facility];  Service: Endoscopy;  Laterality: Bilateral;  WASHING- INTERVENTION BIOPSIES INTERVENTION  BRONCHOSCOPY WITH VIDEO    Family History  Problem Relation Age of Onset  . Colon cancer Neg Hx   . Breast cancer Neg Hx   . Diabetes Mother     GM  . Heart attack      ?  Marland Kitchen Heart disease Mother   . Heart disease Maternal Grandfather   . Cancer Father     sarcoma    Social History History  Substance Use Topics  . Smoking status: Current Everyday Smoker -- 1.0 packs/day for 38 years    Types: Cigarettes  . Smokeless tobacco: Never Used  . Alcohol Use: 0.0 oz/week     wine occ    Current Outpatient Prescriptions  Medication Sig Dispense Refill  . ALPRAZolam (XANAX) 0.5 MG tablet 1/2-1 by mouth at bedtime as needed for insomnia.  90 tablet  0  . aspirin 81 MG tablet Take 81 mg by mouth daily.        . Calcium Carbonate-Vitamin D (CALTRATE  600+D) 600-400 MG-UNIT per chew tablet Chew 1 tablet by mouth daily.        . Hydrocodone-Acetaminophen 10-660 MG TABS Take 1 tablet by mouth 3 (three) times daily.  90 each  3  . nicotine polacrilex (NICORETTE) 4 MG lozenge Place 4 mg inside cheek as needed.        Marland Kitchen omeprazole (PRILOSEC) 20 MG capsule Take 20 mg by mouth daily.       . quiNINE (QUALAQUIN) 324 MG capsule Take 648 mg by mouth as needed.        . vitamin B-12 (CYANOCOBALAMIN) 100 MCG tablet Take 50 mcg by mouth daily.        . calcium carbonate (TUMS - DOSED IN MG ELEMENTAL CALCIUM) 500 MG chewable tablet Chew 1 tablet by mouth daily.          Allergies  Allergen Reactions  . Alendronate Sodium     REACTION: chest pain  . Antihistamines, Chlorpheniramine-Type     "makes lungs bleed"  . Levofloxacin   . Nsaids     REACTION: ischemic colitis    Review of Systems:  Gen.: She denies any recent fever or chills. She has had 20 pound weight loss since last fall. She denies fatigue. Eyes: Negative ENT: Negative Endocrine: She denies diabetes and hypothyroidism. Cardiovascular: She has had right anterior chest discomfort but is not associated with exertion. She denies exertional dyspnea, orthopnea, and PND. She's had no peripheral edema or palpitations. Respiratory: She denies cough and sputum production at this time. She denies hemoptysis. GI: She has had no nausea or vomiting. She denies dysphagia. She's had no melena or bright red blood per rectum. GU: She denies dysuria and hematuria. Neurological: She denies any focal weakness or numbness. She denies dizziness and syncope. She's never had a TIA or stroke. Musculoskeletal: She does report pain over the right anterior chest wall as well as in the right lateral rib cage. She has a history of significant degenerative joint disease of both knees. She had seven surgeries on the right and one on the left. Psychiatric: negative  BP 118/72  Pulse 86  Temp 97.2 F (36.2 C)   Resp 16  Ht 5\' 7"  (1.702 m)  Wt 121 lb (54.885 kg)  BMI 18.95 kg/m2  SpO2 99% Physical Exam:  She is a thin, white female in no distress. HEENT: Normocephalic and atraumatic. Pupils are equal and reactive to light and accommodation. Extraocular muscles are intact. Oropharynx is clear. Neck: Carotid  pulses are palpable bilaterally. There are no bruits. There are a few small palpable lymph nodes in both cervical regions. There is significant tenderness to palpation of the right supraclavicular fossa but no palpable abnormality there. There is no thyromegaly. Lungs: Decreased breath sounds throughout that are clear. Chest wall: There is tenderness to palpation along the right lateral rib cage. There is no visible abnormality. Heart: Regular rate and rhythm with normal S1 and S2. There is no murmur, rub, or gallop. Abdomen: Bowel sounds are present. Soft and nontender. There are no palpable masses or organomegaly. Extremities: There is no peripheral edema. Pedal pulses are palpable bilaterally. Scars on both knees from prior knee surgery. Neurological: Alert and oriented x3. Motor and sensory exams are grossly normal.  Diagnostic Tests:  CHEST - 2 VIEW   Comparison: 07/29/2008 2006   Findings: A thick-walled cystic area measuring approximately 6 x 4 cm is seen in the right lung apex in the region of the patient's known right upper lobe emphysematous change.  More focal alveolar infiltrate is also identified laterally in the right upper lobe with some associated volume loss suggested by elevation of the minor fissure.  These findings are most suspicious for either a post infectious process or cavitary malignancy.  Further evaluation with chest CT with contrast is recommended.   Milder emphysematous changes are again identified in the left upper lung zone.  The remainder of the lung fields are clear.   Heart and mediastinal contours are within normal limits.  No pleural fluid is seen.   Bony structures appear intact. A well- circumscribed radiopaque density in the left upper quadrant has a shape suggestive of a pill within the splenic flexure.   IMPRESSION: Findings suggestive of new thick-walled cavitary change in the right upper lobe with some associated alveolar infiltrates and volume loss further assessment with chest CT with contrast is recommended for complete evaluation.   Chronic emphysematous changes in the left lung apex.    CT CHEST WITH CONTRAST   Technique:  Multidetector CT imaging of the chest was performed following the standard protocol during bolus administration of intravenous contrast.   Contrast: 80mL OMNIPAQUE IOHEXOL 300 MG/ML IV SOLN   Comparison: Chest x-ray 12/29/2011.   Findings: Mediastinum:  Heart size is normal.  No pericardial fluid, thickening or calcification.  No acute abnormality of the thoracic aorta or other great vessels of the mediastinum.  No filling defects are noted within the central pulmonary arterial tree to suggest large pulmonary emboli.  Small calcified low right paratracheal lymph node.  No pathologically enlarged mediastinal or hilar lymph nodes.  The esophagus is normal in appearance.   Lungs/Pleura: Within the apex of the right upper lobe there is a large thick-walled irregular shaped cavitary lesion that measures at least 6.5 x 6.0 cm on axial image #12 of series 3, and measures approximately 5.3 cm in craniocaudal span. In addition to multiple thick internal septations and irregular nodular walls, in the inferior aspect of the lesion there is a focal area of soft tissue density with an adjacent "air crescent" (demonstrated on image 18 of series 3), which could represent a small mycetoma, or could be a solid soft tissue (i.e., evidence of neoplasm).  In the lateral aspect of the right upper lobe (images 16 - 20 of series 3) there is a more focal pleural based mass like opacity measures approximately 3.7 x  3.2 cm, however, this lesion appears to have a few small internal air bronchograms suggesting that  it is an area of airspace consolidation, rather than a true mass.  Best demonstrated on image 17 of series 3 where it measures 2.9 x 1.1 cm).  Background of mild-moderate centrilobular and paraseptal emphysema, most severe in the upper lobes lungs bilaterally.  No pleural effusions.   Upper Abdomen:  Within the visualized portions of the liver they are too small low attenuation lesions, the largest of which measures 1 cm in segment five (image 61 of series 2), which are too small to definitively characterize, they are favored to represent cysts.   Musculoskeletal:  No aggressive appearing lytic or blastic lesions are noted in the visualized portions of the skeleton.   IMPRESSION:   1.  Irregular shaped thick-walled cavitary lesion in the apex of the right upper lobe that measures at least 6.5 x 6.0 x 5.3 cm. The appearance of this lesion is aggressive, but nonspecific, and differential considerations include sequelae of infection (including atypical organisms such as mycobacterium tuberculosis), or even a thick walled cavitary neoplasm.  In the inferior aspect of the lesion (image 18 of series 3) there is a focal area of soft tissue prominence with a "air crescent" sign, that may either represent a focal soft tissue nodule, or may represent superinfection with organisms such as aspergillus (i.e., a mycetoma).  The adjacent mass-like airspace opacity in the periphery of the right upper lobe (3.7 x 3.2 cm) is favored to represent an area of consolidation, rather than a true mass as there are internal air bronchograms.  There is also a 2.9 x 1.1 cm elliptical shaped soft tissue lesion along the inferior aspect of the cavity in contact with the minor fissure.   The most immediate concern would be whether or not the above findings are sequela of tuberculosis infection.   Accordingly, clinical correlation with consideration for respiratory isolation and sputum testing is highly recommended.  Critical Value/emergent results were called by telephone at the time of interpretation on 01/04/2012  at 07:55 a.m.  to  Dr. Drue Novel, who verbally acknowledged these results.   2. Small low attenuation lesions in the liver are similar to remote prior study from 2008, and are favored to represent cysts. 3.  Background of mild-moderate central lobular and paraseptal emphysema.   Original Report Authenticated By: Florencia Reasons, M.D.   Impression:  She has a large cavitary process involving the apical portion of the right upper lobe of undetermined etiology. Her initial presenting complaints last fall suggest an infectious process such as a lung abscess although I'm surprised that her symptoms were completely resolved after a brief course of oral antibiotics. Tuberculosis might also be of concern although she has had 2 negative PPD's and her bronchoscopic culture was negative. A cavitary lung cancer is also a concern especially given her 20 pound weight loss, heavy smoking history, and significant chest wall symptoms. She does not look like someone who has an active infection going on right now. I reviewed her CT scans with her and the possible etiologies of this cavitary lung mass. I recommended that we proceed with a PET scan before deciding on any further diagnostic procedures or treatment. I will see her back after the PET scan is done so we can discuss the results and decide on the next step.  Plan  She will have a PET scan done and then will return to our office for review of the scan and further discussion.

## 2012-03-19 ENCOUNTER — Encounter (HOSPITAL_COMMUNITY)
Admission: RE | Admit: 2012-03-19 | Discharge: 2012-03-19 | Disposition: A | Discharge status: Home / Self Care | Payer: 59 | Source: Ambulatory Visit | Attending: Surgery | Admitting: Surgery

## 2012-03-19 DIAGNOSIS — R599 Enlarged lymph nodes, unspecified: Secondary | ICD-10-CM | POA: Insufficient documentation

## 2012-03-19 DIAGNOSIS — D381 Neoplasm of uncertain behavior of trachea, bronchus and lung: Secondary | ICD-10-CM

## 2012-03-19 DIAGNOSIS — J984 Other disorders of lung: Secondary | ICD-10-CM | POA: Insufficient documentation

## 2012-03-19 MED ORDER — FLUDEOXYGLUCOSE F - 18 (FDG) INJECTION
15.4000 | Freq: Once | INTRAVENOUS | Status: AC | PRN
  Administered 2012-03-19: 15.4 via INTRAVENOUS

## 2012-03-20 ENCOUNTER — Encounter: Payer: 59 | Admitting: Surgery

## 2012-03-20 ENCOUNTER — Encounter: Payer: Self-pay | Admitting: Surgery

## 2012-03-20 ENCOUNTER — Ambulatory Visit (INDEPENDENT_AMBULATORY_CARE_PROVIDER_SITE_OTHER): Payer: 59 | Admitting: Surgery

## 2012-03-20 VITALS — BP 110/72 | HR 92 | Temp 98.9°F | Resp 18 | Ht 67.0 in | Wt 118.0 lb

## 2012-03-20 DIAGNOSIS — R222 Localized swelling, mass and lump, trunk: Secondary | ICD-10-CM

## 2012-03-20 LAB — GLUCOSE, CAPILLARY: Glucose-Capillary: 90 mg/dL (ref 70–99)

## 2012-03-20 NOTE — Progress Notes (Signed)
[address]            [name] [address]          [phone]      HPI:  The patient returns to the office today to discuss the results of her PET scan. I saw her in the office last week for a large cavitary right upper lobe lung process. Please refer to my surgical consult note from last week concerning her full history. When I saw her last week she said that she did not have any fever or sputum production since last fall of 2012. Then on Saturday she said that she woke up with low-grade fever of about 100.5 as well as new onset of cough and foul-smelling purulent sputum. She said she coughed up a large amount of this. There is no hemoptysis. She called my partner on call, Dr. Donata Clay, who started her on azithromycin. She said she felt poorly over the weekend but  is starting to improve a little. She still feels like she has some low-grade fever and cough and purulent sputum. She denies any chills. She continues to have right lateral rib pain as well as pain in the supraclavicular fossa.  Current Outpatient Prescriptions  Medication Sig Dispense Refill  . ALPRAZolam (XANAX) 0.5 MG tablet 1/2-1 by mouth at bedtime as needed for insomnia.  90 tablet  0  . aspirin 81 MG tablet Take 81 mg by mouth daily.        Marland Kitchen azithromycin (ZITHROMAX) 250 MG tablet Take 250 mg by mouth daily. X 5 days      . calcium carbonate (TUMS - DOSED IN MG ELEMENTAL CALCIUM) 500 MG chewable tablet Chew 1 tablet by mouth daily.        . Calcium Carbonate-Vitamin D (CALTRATE 600+D) 600-400 MG-UNIT per chew tablet Chew 1 tablet by mouth daily.        Marland Kitchen guaiFENesin (MUCINEX) 600 MG 12 hr tablet Take 1,200 mg by mouth 2 (two) times daily.      . Hydrocodone-Acetaminophen 10-660 MG TABS Take 1 tablet by mouth 3 (three) times daily.  90 each  3  . nicotine polacrilex (NICORETTE) 4 MG lozenge Place 4 mg inside cheek as needed.        Marland Kitchen omeprazole (PRILOSEC) 20 MG capsule Take 20 mg by mouth  daily.       . quiNINE (QUALAQUIN) 324 MG capsule Take 648 mg by mouth as needed.        . vitamin B-12 (CYANOCOBALAMIN) 100 MCG tablet Take 50 mcg by mouth daily.           Physical Exam: BP 110/72  Pulse 92  Temp(Src) 98.9 F (37.2 C) (Oral)  Resp 18  Ht 5\' 7"  (1.702 m)  Wt 118 lb (53.524 kg)  BMI 18.48 kg/m2  SpO2 98% She looks chronically ill but in no distress. Lung exam reveals a few rhonchi bilaterally. She has not coughed up any sputum in the office.  Diagnostic Tests:  NUCLEAR MEDICINE PET SKULL BASE TO THIGH   Fasting Blood Glucose:  90   Technique:  15.4 mCi F-18 FDG was injected intravenously. CT data was obtained and used for attenuation correction and anatomic localization only.  (This was not acquired as a diagnostic CT examination.) Additional exam technical data entered on technologist worksheet.   Comparison:  01/04/2012   Findings:  Neck:  No hypermetabolic mass or lymph nodes within the soft tissues of the neck.   Chest:  Right paratracheal and precarinal lymph nodes exhibit mild increased FDG uptake.  The SUV max associated with the precarinal lymph node is equal to 4.1, image number 83.   Extensive thick-walled cavitary process involving the right upper lobe is identified.  This measures approximately 6.2 x 6.5 x 6.6 cm. On the previous exam this measured 5.4 x 5.5 x 4.9 cm. On today's exam, there is increased FDG uptake identified within the walls  and soft tissue components of this lesion.  The SUV max within this area is equal to 6.0.   Along the medial periphery of this mass there is a focal area of intense FDG uptake measuring 1.3 cm.  This has an SUV max equal to 6.8, image 84.   No abnormal uptake within the right lower lobe or right middle lobe.  There is no abnormal activity within the left lung.   Abdomen/Pelvis:  No abnormal hypermetabolic activity within the liver, pancreas, adrenal glands, or spleen.  No  hypermetabolic lymph nodes in the abdomen or pelvis.   [place]:  No focal hypermetabolic activity to suggest skeletal metastasis.   IMPRESSION:   1. Large cavitary lesion involving the right upper lobe exhibits malignant range FDG uptake within the walls and soft tissue portions of this lesion. 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  A similar appearance could also be seen with indolent infection or inflammation. 2.  Enlarged and hypermetabolic right paratracheal and precarinal lymph nodes.  Cannot rule out metastatic adenopathy.   Original Report Authenticated By: Rosealee Albee, M.D.   Impression/Plan:  She has a large cavitary lesion in the right upper lobe that shows malignant range FDG uptake within the walls and soft tissue portions of this lesion. 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 She did not have any sputum production for many months but now seems to have developed an acute infectious process on Saturday. I think a CT guided core needle biopsy is indicated to try to determine if this is a malignant process. Unfortunately, now she has an acute infectious process which requires treatment. I doubt that a brief course of azithromycin is going to clear this up.  I told her that I thought she should probably be in the hospital for intravenous antibiotics and infectious disease consultation. She said she has an appointment tomorrow with her primary medical physician and would like to see them before she decides on any further course of action. She does not want to be admitted to the hospital at this time and I think she is worried about missing work. I am concerned she is going to end up with infection spreading to another area of the right lung or the left lung. I don't think a CT guided needle biopsy should be done until she recovers from this acute process but it is important to determine if this is a malignant process versus a chronic, indolent  lung abscess that may eventually require right upper lobectomy for treatment. I discussed all this with her in detail and reviewed the scans on our computer with her. I asked her to  contact me tomorrow after she sees her primary care physician so that we can decide on further treatment. She will continue her current antibiotic. I gave her a sterile specimen cup and instructed her to get a good sputum specimen to bring back to our office  or give her primary care physician.

## 2012-03-21 ENCOUNTER — Ambulatory Visit (INDEPENDENT_AMBULATORY_CARE_PROVIDER_SITE_OTHER): Payer: 59 | Admitting: Family Medicine

## 2012-03-21 ENCOUNTER — Encounter: Payer: Self-pay | Admitting: Family Medicine

## 2012-03-21 VITALS — BP 118/77 | HR 104 | Temp 100.1°F | Ht 66.0 in | Wt 119.4 lb

## 2012-03-21 DIAGNOSIS — R059 Cough, unspecified: Secondary | ICD-10-CM

## 2012-03-21 DIAGNOSIS — R058 Other specified cough: Secondary | ICD-10-CM | POA: Insufficient documentation

## 2012-03-21 DIAGNOSIS — R05 Cough: Secondary | ICD-10-CM

## 2012-03-21 MED ORDER — MOXIFLOXACIN HCL 400 MG PO TABS: 400.0000 mg | ORAL_TABLET | Freq: Every day | ORAL | Status: AC

## 2012-03-21 MED ORDER — GUAIFENESIN-CODEINE 100-10 MG/5ML PO SYRP: 10.0000 mL | ORAL_SOLUTION | Freq: Three times a day (TID) | ORAL | Status: DC | PRN

## 2012-03-21 NOTE — Assessment & Plan Note (Addendum)
New.  Pt w/ cavitary chest lesion and now fever and cough productive of purulent sputum.  Reviewed Dr Sharee Pimple visit from yesterday and recent imaging. Get sputum cx, AFB smear.  Switch from Zpack to Avelox (pt reports she has taken this w/out difficulty despite listed Levaquin allergy).  Cough meds given.  Encouraged pt to go to ER if sxs worsen.

## 2012-03-21 NOTE — Progress Notes (Signed)
  Subjective:    Patient ID: [name] [name], female    DOB: [birth year], 56 y.o.   MRN: [id number]  HPI 'sick as a dog'- 'achy like the flu, fever, and I'm coughing up death'.  Cough is productive and 'it tastes like rotten tuna fish'.  Pt saw Dr Laneta Simmers yesterday and was given a sterile sputum cup for culture.  Pt was advised to go to hospital for admission and IV abx.  Pt refuses b/c she is still on probationary period for work.  Has large cavitary lung mass on R side.  Became ill on Saturday.  + sick contacts.  Is on last day of Zpack.   Review of Systems For ROS see HPI     Objective:   Physical Exam  Constitutional: She appears well-developed and well-nourished. No distress.  HENT:  Head: Normocephalic and atraumatic.       TMs normal bilaterally Mild nasal congestion Throat w/out erythema, edema, or exudate  Eyes: Conjunctivae and EOM are normal. Pupils are equal, round, and reactive to light.  Neck: Normal range of motion. Neck supple.  Cardiovascular: Normal rate, regular rhythm, normal heart sounds and intact distal pulses.   No murmur heard. Pulmonary/Chest: Effort normal and breath sounds normal. No respiratory distress. She has no wheezes. She has no rales.       + hacking cough  Lymphadenopathy:    She has no cervical adenopathy.          Assessment & Plan:

## 2012-03-21 NOTE — Patient Instructions (Signed)
Start the Avelox daily Use the cough syrup as needed Add Mucinex to thin your congestion Tylenol/Ibuprofen as needed for fever We'll notify you of your sputum results If you feel worse or are not improving on the Avelox will need to go to ER Hang in there!

## 2012-03-22 ENCOUNTER — Telehealth: Payer: Self-pay | Admitting: Internal Medicine

## 2012-03-22 NOTE — Telephone Encounter (Signed)
Caller: Manuelita/Patient; PCP: Sheliah Hatch.; CB#: [phone]; Call regarding Itching. Pt developed itching after taking Robitussin AC 10 ml and Avalox 400mg  for a persistant cough at 1500 03/21/12. Her palms began itching w/o a rash 30 mins after taking the meds. Benadryl 25mg  relieved the itching after 30 mins at 1900. Pt slept very well last PM. Did not take anymore Robitussin AC today but took Avalox at 1500. She has no itching. She ? if she can take the Robistussin AC along with Benadryl. Homecare advised per Allergic Reaction Protocol. Pt is advised may take Robitussin AC 10 ml with Benadryl 25mg  prn. Narcotics can cause itching. If she develops any breathing dificulties or a rash to dc the cough medication. Call back parameters reviewed.

## 2012-03-23 MED ORDER — HYDROCODONE-HOMATROPINE 5-1.5 MG/5ML PO SYRP: 5.0000 mL | ORAL_SOLUTION | Freq: Every evening | ORAL | Status: DC | PRN

## 2012-03-23 NOTE — Telephone Encounter (Signed)
Faxed rx

## 2012-03-23 NOTE — Telephone Encounter (Signed)
Call pt , I prefer not to take Robitussin-AC again. I noticed that she tolerates hydrocodone well.  I'll send a new prescription for a syrup with hydrocodone to be taken at night only as needed for cough. Stop all syrup prescriptions for cough if she has more itching and  stick to Mucinex DM

## 2012-03-25 ENCOUNTER — Emergency Department (HOSPITAL_COMMUNITY)
Admission: EM | Admit: 2012-03-25 | Discharge: 2012-03-25 | Disposition: A | Discharge status: Home / Self Care | Payer: 59 | Source: Home / Self Care | Attending: Emergency Medicine | Admitting: Emergency Medicine

## 2012-03-25 ENCOUNTER — Encounter (HOSPITAL_COMMUNITY): Payer: Self-pay

## 2012-03-25 DIAGNOSIS — R059 Cough, unspecified: Secondary | ICD-10-CM

## 2012-03-25 DIAGNOSIS — R109 Unspecified abdominal pain: Secondary | ICD-10-CM

## 2012-03-25 DIAGNOSIS — R05 Cough: Secondary | ICD-10-CM

## 2012-03-25 MED ORDER — HYDROCODONE-ACETAMINOPHEN 7.5-500 MG/15ML PO SOLN: 15.0000 mL | Freq: Four times a day (QID) | ORAL | Status: AC | PRN

## 2012-03-25 NOTE — ED Notes (Signed)
Pt has epigastric pain that started after coughing on Friday.  She is presently on antibiotics and has had numerous test to evaluate a rt lung mass.

## 2012-03-25 NOTE — ED Provider Notes (Signed)
History     CSN: [id number]  Arrival date & time 03/25/12  0902   First MD Initiated Contact with Patient 03/25/12 [phone]      Chief Complaint  Patient presents with  . Abdominal Pain    (Consider location/radiation/quality/duration/timing/severity/associated sxs/prior treatment) Patient is a 56 y.o. female presenting with abdominal pain.  Abdominal Pain The primary symptoms of the illness include abdominal pain. The primary symptoms of the illness do not include fever, fatigue, shortness of breath, nausea, vomiting, diarrhea or vaginal discharge. The current episode started 2 days ago. The onset of the illness was gradual. The problem has not changed since onset. The patient has not had a change in bowel habit. Symptoms associated with the illness do not include chills, anorexia, diaphoresis, constipation, urgency or hematuria. Significant associated medical issues do not include GERD or gallstones.    Past Medical History  Diagnosis Date  . Asthma     adult onset  . COPD (chronic obstructive pulmonary disease)     per CT 11/10/05, will minimal bronchiectasis rll  . GERD (gastroesophageal reflux disease)   . Osteoporosis     dexa per gyn  . 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 Headache   . Lichen planus   . B12 deficiency   . Endometriosis   . Cataracts, bilateral     Past Surgical History  Procedure Date  . Knee surgery     x 8  . Ovarian cyst removed   . Video bronchoscopy 02/15/2012    Procedure: VIDEO BRONCHOSCOPY WITH FLUORO;  Surgeon: Nyoka Cowden, MD;  Location: [facility];  Service: Endoscopy;  Laterality: Bilateral;  WASHING- INTERVENTION BIOPSIES INTERVENTION BRONCHOSCOPY WITH VIDEO    Family History  Problem Relation Age of Onset  . Colon cancer Neg Hx   . Breast cancer Neg Hx   . Diabetes Mother     GM  . Heart attack      ?  Marland Kitchen Heart disease Mother   . Heart disease Maternal Grandfather   . Cancer Father     sarcoma    History    Substance Use Topics  . Smoking status: Current Everyday Smoker -- 1.0 packs/day for 38 years    Types: Cigarettes  . Smokeless tobacco: Never Used  . Alcohol Use: 0.0 oz/week     wine occ    OB History    Grav Para Term Preterm Abortions TAB SAB Ect Mult Living                  Review of Systems  Constitutional: Negative for fever, chills, diaphoresis and fatigue.  Respiratory: Positive for cough. Negative for shortness of breath.   Cardiovascular: Negative for chest pain and palpitations.  Gastrointestinal: Positive for abdominal pain. Negative for nausea, vomiting, diarrhea, constipation and anorexia.  Genitourinary: Negative for urgency, hematuria and vaginal discharge.    Allergies  Alendronate sodium; Antihistamines, chlorpheniramine-type; Levofloxacin; Nsaids; and Robitussin a-c  Home Medications   Current Outpatient Rx  Name Route Sig Dispense Refill  . ALPRAZOLAM 0.5 MG PO TABS  1/2-1 by mouth at bedtime as needed for insomnia. 90 tablet 0  . ASPIRIN 81 MG PO TABS Oral Take 81 mg by mouth daily.      . AZITHROMYCIN 250 MG PO TABS Oral Take 250 mg by mouth daily. X 5 days    . CALCIUM CARBONATE ANTACID 500 MG PO CHEW Oral Chew 1 tablet by mouth daily.      Marland Kitchen  CALCIUM CARBONATE-VITAMIN D 600-400 MG-UNIT PO CHEW Oral Chew 1 tablet by mouth daily.      . GUAIFENESIN ER 600 MG PO TB12 Oral Take 1,200 mg by mouth 2 (two) times daily.    Marland Kitchen HYDROCODONE-ACETAMINOPHEN 7.5-500 MG/15ML PO SOLN Oral Take 15 mLs by mouth every 6 (six) hours as needed for pain. 120 mL 0  . HYDROCODONE-ACETAMINOPHEN 7.5-500 MG/15ML PO SOLN Oral Take 15 mLs by mouth every 6 (six) hours as needed for pain. 120 mL 0  . MOXIFLOXACIN HCL 400 MG PO TABS Oral Take 1 tablet (400 mg total) by mouth daily. 10 tablet 0  . NICOTINE POLACRILEX 4 MG MT LOZG Buccal Place 4 mg inside cheek as needed.      Marland Kitchen OMEPRAZOLE 20 MG PO CPDR Oral Take 20 mg by mouth daily.     . QUININE SULFATE 324 MG PO CAPS Oral Take 648  mg by mouth as needed.      Marland Kitchen VITAMIN B-12 100 MCG PO TABS Oral Take 50 mcg by mouth daily.        BP 128/78  Pulse 101  Temp(Src) 97.9 F (36.6 C) (Oral)  Resp 16  SpO2 98%  Physical Exam  Constitutional: She appears well-developed and well-nourished. No distress.  HENT:  Head: Normocephalic.  Mouth/Throat: No oropharyngeal exudate.  Abdominal: Soft. She exhibits no distension and no mass. There is tenderness. There is no rigidity, no rebound, no guarding, no CVA tenderness, no tenderness at McBurney's point and negative Murphy's sign. No hernia. Hernia confirmed negative in the ventral area.    Skin: Skin is warm. No abrasion, no bruising, no burn, no ecchymosis, no lesion and no rash noted. She is not diaphoretic. No erythema.    ED Course  Procedures (including critical care time)  Labs Reviewed - No data to display No results found.   1. Abdominal wall pain   2. Cough       MDM  Patient describes that since Friday been coughing more frequently and harder. Patient undergoing evaluation for potential pulmonary malignancy, pending pathology characterization. Symptomatic for cough, no disc or fevers. Recent alveolar lavage results were negative for active microorganisms. Patient has marked tenderness reproducible with movement and palpation on the left paramedian region. Unable to palpate a ventral paramedian abdominal hernia. We discussed symptom management and also discussed symptoms that should warrant further evaluation in the emergency department. Patient agreed with treatment plan and followup care, patient also knowledge is an describe that she will call to rearrange an appointment       Jimmie Molly, MD 03/25/12 1136

## 2012-03-25 NOTE — Discharge Instructions (Signed)
As discussed your exam is suggestive of muscular abdominal pain we discuss several things to prevent further discomfort as you continue to cough. We also discussed certain modifications to your medicine as not to duplicate this we decided and you agreed to take this elixir. Any further symptoms of pain exacerbates will need further evaluation in the emergency department as discussed.

## 2012-03-27 ENCOUNTER — Telehealth: Payer: Self-pay | Admitting: *Deleted

## 2012-03-27 NOTE — Telephone Encounter (Signed)
Pt called to see if the results for her sputum test has come in yet, advised that they have not come in at this point however we will try to locate the results and contact her with the results asap, pt understood and advised to call her at work when we do get them in.

## 2012-03-29 NOTE — Telephone Encounter (Signed)
Called pt to advise no sign of infection noted in her sputum,advised family member to have pt call office back when available

## 2012-03-29 NOTE — Telephone Encounter (Signed)
Pt returned your call. Call back # is [phone]

## 2012-03-29 NOTE — Telephone Encounter (Signed)
Spoke to pt to advise results/instructions of her sputum culture as negative. Pt understood. Pt noted that she still feels better, but still noting a lot of coughing and wants to know the next steps, per notes that she went to UC recently and was given another cough syrup with  No resolve, per MD Beverely Low verbal instructions, advised to call her pulmonologist per the fact that she is still experiencing coughing and has been to UC and MD Tabori, tried all the cough medications with no resolve as well as her current sxs with her lungs, pt understood and will contact MD Sherene Sires

## 2012-04-10 ENCOUNTER — Ambulatory Visit (INDEPENDENT_AMBULATORY_CARE_PROVIDER_SITE_OTHER): Payer: 59 | Admitting: Internal Medicine

## 2012-04-10 VITALS — BP 124/78 | HR 93 | Temp 98.1°F | Wt 117.0 lb

## 2012-04-10 DIAGNOSIS — R222 Localized swelling, mass and lump, trunk: Secondary | ICD-10-CM

## 2012-04-10 DIAGNOSIS — R918 Other nonspecific abnormal finding of lung field: Secondary | ICD-10-CM

## 2012-04-10 NOTE — Assessment & Plan Note (Addendum)
Patient has a history of a cavitary right lung mass, etiology unclear but is likely a infection or cancer. Patient is losing weight, my plan is discussed directly w/  her thoracic surgeon to  see what the next step. Patient is somewhat reluctant to proceed with any aggressive treatment (mostly due to insurance issues) but in no uncertain terms I told the patient that whatever she has needs to be evaluated aggressively. I called Dr. Laneta Simmers and asked for call back. JP Addendum: I spoke with Dr. Laneta Simmers today, he states that Chevie  has a life threatening problem, either a infection or cancer can be lethal d/t sepsis, severe hemoptysis etc.; his first choice would be to do a right lobectomy,  second choice would be to do a CT-guided biopsy. I left a message with the patient, I will try to contact her again tomorrow. JP Addendum: In no uncertain terms, I discussed my conversation with Dr. Laneta Simmers with [name]. She knows this is a life-threatening situation and she needs to be seen by surgery ASAP. JP

## 2012-04-10 NOTE — Progress Notes (Signed)
  Subjective:    Patient ID: [name] [name], female    DOB: 1956-09-15, 56 y.o.   MRN: [id number]  HPI Followup from previous visits. The patient's main concern today is weight loss and severe fatigue. She also has noted some muscle wasting.  Past Medical History:  Asthma,adult onset  COPD, per CT 11/10/05, with minimal bronchiectasis rll  GERD  Osteoarthritis  Osteoporosis--Dexa per gyn  Endometriosis  Recurrent isch colitis  Tobacco abuse  Headaches  LICHEN PLANUS  Hx of B12 DEFICIENCY  Cataracts  Past Surgical History:  Knee surgeries x 8  ovarian cyst removal  Social History:  Single, no children  mom and sister live in her house  Works at [employer]  Current smoker since age 19. Still smokes  Drinks wine daily      Review of Systems Since the last visit here 03/21/2012, she is better in regards to her acute sx: No fever or chills, she does have some night sweats. She continued to have right-sided chest pain but is better. Cough has decrease, she still produces some yellow sputum, no hemoptysis.     Objective:   Physical Exam  General -- alert, well-developed, slt underweight appearing. No apparent distress.  Neck --no LADs Lungs --decreased breath sounds Heart-- normal rate, regular rhythm, no murmur, and no gallop.   Extremities-- no pretibial edema bilaterally  Neurologic-- alert & oriented X3 and strength normal in all extremities. Psych-- Cognition and judgment appear intact. Alert and cooperative with normal attention span and concentration.  not anxious appearing and not depressed appearing.  \    Assessment & Plan:  I spent more than 30 min with the patient, >50% of the time counseling, and coordinating her care

## 2012-04-12 ENCOUNTER — Encounter: Payer: Self-pay | Admitting: Internal Medicine

## 2012-04-16 ENCOUNTER — Ambulatory Visit: Payer: 59 | Admitting: Internal Medicine

## 2012-05-01 ENCOUNTER — Encounter: Payer: 59 | Admitting: Surgery

## 2012-05-04 ENCOUNTER — Ambulatory Visit (INDEPENDENT_AMBULATORY_CARE_PROVIDER_SITE_OTHER): Payer: 59 | Admitting: Surgery

## 2012-05-04 ENCOUNTER — Encounter: Payer: Self-pay | Admitting: Surgery

## 2012-05-04 VITALS — BP 133/90 | HR 88 | Temp 98.8°F | Resp 20 | Ht 67.0 in | Wt 118.0 lb

## 2012-05-04 DIAGNOSIS — J852 Abscess of lung without pneumonia: Secondary | ICD-10-CM

## 2012-05-05 ENCOUNTER — Encounter: Payer: Self-pay | Admitting: Surgery

## 2012-05-05 DIAGNOSIS — J852 Abscess of lung without pneumonia: Secondary | ICD-10-CM | POA: Insufficient documentation

## 2012-05-05 NOTE — Progress Notes (Signed)
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 I last saw her on 03/20/2012 and she looked ill and recently started having fever and cough with foul-smelling purulent sputum. We discussed her recent PET scan and my interpretation of the results as noted above as well as my recommendation that she is going to require right upper lobectomy. She was hesitant to have any kind of surgery at that point due to her fear of losing her job and returned to see her primary physician and was started on antibiotics. She said that her fever resolved and her sputum production has markedly decreased although she is still coughing up some foul-smelling sputum at times. She also reports developing some hemoptysis. She initially coughed up about 2 teaspoons of maroon- colored blood one  day and then coughed up a smaller amount daily for the next 5 days. She reports no hemoptysis for the past 2 weeks. She returned to see her primary physician, Dr. Drue Novel, and I discussed the case with him by telephone. He encouraged her to return to see me to discuss this further. She said that she still has some right-sided chest discomfort. She's had no recent fever or chills. She does report she has been eating well but still losing weight. She has continued to work full-time. She continues to smoke about one pack of cigarettes per day on the weekend and a little less during the week when she is working.  Current Outpatient Prescriptions  Medication Sig Dispense Refill  . ALPRAZolam (XANAX) 0.5 MG tablet 1/2-1 by mouth at bedtime as needed for insomnia.  90 tablet  0  . aspirin 81 MG tablet Take 81 mg by mouth daily.        . calcium carbonate (TUMS - DOSED IN MG ELEMENTAL CALCIUM) 500 MG chewable tablet  Chew 1 tablet by mouth daily.        . Calcium Carbonate-Vitamin D (CALTRATE 600+D) 600-400 MG-UNIT per chew tablet Chew 1 tablet by mouth daily.        Marland Kitchen guaiFENesin (MUCINEX) 600 MG 12 hr tablet Take 1,200 mg by mouth 2 (two) times daily.      Marland Kitchen HYDROcodone-acetaminophen (LORCET) 10-650 MG per tablet Take 1 tablet by mouth every 6 (six) hours as needed.      . nicotine polacrilex (NICORETTE) 4 MG lozenge Place 4 mg inside cheek as needed.        Marland Kitchen omeprazole (PRILOSEC) 20 MG capsule Take 20 mg by mouth daily.       . quiNINE (QUALAQUIN) 324 MG capsule Take 648 mg by mouth as needed.        . vitamin B-12 (CYANOCOBALAMIN) 100 MCG tablet Take 50 mcg by mouth daily.           Physical Exam: BP 133/90  Pulse 88  Temp(Src) 98.8 F (37.1 C) (Oral)  Resp 20  Ht 5\' 7"  (1.702 m)  Wt 118 lb (53.524 kg)  BMI 18.48 kg/m2  SpO2 98% She looks very thin but in no distress. Her lung exam reveals decreased breath sounds  throughout. There are a few small palpable cervical lymph nodes bilaterally. Cardiac exam shows a regular rate and rhythm with normal heart sounds.  Diagnostic Tests:  NUCLEAR MEDICINE PET SKULL BASE TO THIGH   Fasting Blood Glucose:  90   Technique:  15.4 mCi F-18 FDG was injected intravenously. CT data was obtained and used for attenuation correction and anatomic localization only.  (This was not acquired as a diagnostic CT examination.) Additional exam technical data entered on technologist worksheet.   Comparison:  01/04/2012   Findings:   Neck:  No hypermetabolic mass or lymph nodes within the soft tissues of the neck.   Chest:  Right paratracheal and precarinal lymph nodes exhibit mild increased FDG uptake.  The SUV max associated with the precarinal lymph node is equal to 4.1, image number 83.   Extensive thick-walled cavitary process involving the right upper lobe is identified.  This measures approximately 6.2 x 6.5 x 6.6 cm. On the previous exam this  measured 5.4 x 5.5 x 4.9 cm. On today's exam, there is increased FDG uptake identified within the walls  and soft tissue components of this lesion.  The SUV max within this area is equal to 6.0.   Along the medial periphery of this mass there is a focal area of intense FDG uptake measuring 1.3 cm.  This has an SUV max equal to 6.8, image 84.   No abnormal uptake within the right lower lobe or right middle lobe.  There is no abnormal activity within the left lung.   Abdomen/Pelvis:  No abnormal hypermetabolic activity within the liver, pancreas, adrenal glands, or spleen.  No hypermetabolic lymph nodes in the abdomen or pelvis.   [place]:  No focal hypermetabolic activity to suggest skeletal metastasis.   IMPRESSION:   1. Large cavitary lesion involving the right upper lobe exhibits malignant range FDG uptake within the walls and soft tissue portions of this lesion. 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  A similar appearance could also be seen with indolent infection or inflammation. 2.  Enlarged and hypermetabolic right paratracheal and precarinal lymph nodes.  Cannot rule out metastatic adenopathy.   Original Report Authenticated By: Rosealee Albee, M.D.   Impression/Plan:  She has a cavitary lung process in the right upper lobe that may involve a small part of the superior segment of the right lower lobe. This has hypermetabolic uptake within the walls on PET scan. There are also enlarged lymph nodes in the right paratracheal and precarinal region that had hypermetabolic uptake. All this could be related to a chronic right upper lobe lung abscess but it is certainly possible that this could be a cavitary lung cancer with mediastinal lymph node metastasis. I think the right upper lobe should be removed whether this is a cavitary lung abscess or cavitary cancer, as long as there are no lymph node metastasis. I think it would be best to perform mediastinoscopy first  to sample these hypermetabolic mediastinal lymph nodes. If there is any cancer in these lymph nodes then it would be an advanced stage cancer and I would not recommend performing right upper lobectomy. If the lymph nodes are negative for cancer then I would recommend performing right upper lobectomy. Even if this is all infection the right upper lobe should come out to decrease her risk of life-threatening sepsis or hemorrhage. I discussed all this with her and reviewed the scans with her. I answered all of her questions and she is in agreement with  proceeding with mediastinoscopy as an outpatient. We have scheduled this in about 2 weeks. My schedule is full next week and I will be away on vacation the following week.  I discussed the operative procedure with her including alternatives, benefits, and risks including but not limited to bleeding, injury to mediastinal structures, pneumothorax, and she understands and agrees to proceed.

## 2012-05-08 ENCOUNTER — Other Ambulatory Visit: Payer: Self-pay

## 2012-05-08 DIAGNOSIS — D381 Neoplasm of uncertain behavior of trachea, bronchus and lung: Secondary | ICD-10-CM

## 2012-05-16 ENCOUNTER — Encounter (HOSPITAL_COMMUNITY): Payer: Self-pay | Admitting: Respiratory Therapy

## 2012-05-18 ENCOUNTER — Encounter (HOSPITAL_COMMUNITY)
Admission: RE | Admit: 2012-05-18 | Discharge: 2012-05-18 | Disposition: A | Discharge status: Home / Self Care | Payer: 59 | Source: Ambulatory Visit | Attending: Surgery | Admitting: Surgery

## 2012-05-18 ENCOUNTER — Encounter (HOSPITAL_COMMUNITY): Payer: Self-pay

## 2012-05-18 HISTORY — DX: Unspecified osteoarthritis, unspecified site: M19.90

## 2012-05-18 LAB — COMPREHENSIVE METABOLIC PANEL
ALT: 15 U/L (ref 0–35)
AST: 17 U/L (ref 0–37)
Albumin: 4.2 g/dL (ref 3.5–5.2)
Calcium: 9.4 mg/dL (ref 8.4–10.5)
GFR calc Af Amer: >90 mL/min (ref >90)
Glucose, Bld: 122 mg/dL — ABNORMAL HIGH (ref 70–99)
Sodium: 140 mEq/L (ref 135–145)
Total Protein: 7.7 g/dL (ref 6.0–8.3)

## 2012-05-18 LAB — CBC
MCH: 34.7 pg — ABNORMAL HIGH (ref 26.0–34.0)
MCHC: 33.9 g/dL (ref 30.0–36.0)
Platelets: 377 10*3/uL (ref 150–400)
RDW: 13.1 % (ref 11.5–15.5)

## 2012-05-18 LAB — SURGICAL PCR SCREEN
MRSA, PCR: NEGATIVE
Staphylococcus aureus: NEGATIVE

## 2012-05-18 NOTE — Progress Notes (Signed)
Dee with [facility] returned call, notified patient refused  T & S today-will need to do DOS. No prior transfusion per patient.

## 2012-05-18 NOTE — Progress Notes (Addendum)
Patient states she had a stress test  About 2001 in [place] -routine-ok.   Patient refused T & S today due to having to wear bracelet. Will let [facility] know. Left voice mail for [facility] to call regarding time of OR-patient upset about it being on schedule for 1227.

## 2012-05-18 NOTE — Pre-Procedure Instructions (Signed)
20 [name] [name]  05/18/2012   Your procedure is scheduled on:  05/25/12  Report to [facility] at 8:30 AM.  Call this number if you have problems the morning of surgery: [phone]   Remember:   Do not eat food or drink liquids after midnight.  Take these medicines the morning of surgery with A SIP OF WATER: Prilosec/Omeprazole, Xanax, Lorcet   Do not wear jewelry, make-up or nail polish.  Do not wear lotions, powders, or perfumes. You may wear deodorant.  Do not shave 48 hours prior to surgery. Men may shave face and neck.  Do not bring valuables to the hospital.  Contacts, dentures or bridgework may not be worn into surgery.  Leave suitcase in the car. After surgery it may be brought to your room.  For patients admitted to the hospital, checkout time is 11:00 AM the day of discharge.   Patients discharged the day of surgery will not be allowed to drive home.  Name and phone number of your driver: sister [name] [name]  Special Instructions: CHG Shower Use Special Wash: 1/2 bottle night before surgery and 1/2 bottle morning of surgery.   Please read over the following fact sheets that you were given: Pain Booklet, Coughing and Deep Breathing, Blood Transfusion Information, MRSA Information and Surgical Site Infection Prevention

## 2012-05-18 NOTE — Pre-Procedure Instructions (Signed)
20 [name] [name]  05/18/2012   Your procedure is scheduled on:  05/06/12  Report to [facility] at 8:30 AM.  Call this number if you have problems the morning of surgery: [phone]   Remember:   Do not eat food or liquids after midnight.  Take these medicines the morning of surgery with A SIP OF WATER: Xanax, Prilosec, Lorcet   Do not wear jewelry, make-up or nail polish.  Do not wear lotions, powders, or perfumes. You may wear deodorant.  Do not shave 48 hours prior to surgery. Men may shave face and neck.  Do not bring valuables to the hospital.  Contacts, dentures or bridgework may not be worn into surgery.  Leave suitcase in the car. After surgery it may be brought to your room.  For patients admitted to the hospital, checkout time is 11:00 AM the day of discharge.   Patients discharged the day of surgery will not be allowed to drive home.  Name and phone number of your driver: sister Tamela Oddi  Special Instructions: CHG Shower Use Special Wash: 1/2 bottle night before surgery and 1/2 bottle morning of surgery.   Please read over the following fact sheets that you were given: Pain Booklet, Coughing and Deep Breathing, Blood Transfusion Information, MRSA Information and Surgical Site Infection Prevention

## 2012-05-24 MED ORDER — CEFUROXIME SODIUM 1.5 G IJ SOLR
1.5000 g | INTRAMUSCULAR | Status: AC
  Administered 2012-05-25: 1.5 g via INTRAVENOUS
  Filled 2012-05-24 (×2): qty 1.5

## 2012-05-25 ENCOUNTER — Ambulatory Visit (HOSPITAL_COMMUNITY): Payer: 59

## 2012-05-25 ENCOUNTER — Encounter (HOSPITAL_COMMUNITY): Payer: Self-pay | Admitting: Certified Registered"

## 2012-05-25 ENCOUNTER — Ambulatory Visit (HOSPITAL_COMMUNITY): Payer: 59 | Admitting: Certified Registered"

## 2012-05-25 ENCOUNTER — Encounter (HOSPITAL_COMMUNITY): Payer: Self-pay | Admitting: *Deleted

## 2012-05-25 ENCOUNTER — Ambulatory Visit (HOSPITAL_COMMUNITY)
Admission: RE | Admit: 2012-05-25 | Discharge: 2012-05-25 | Disposition: A | Discharge status: Home / Self Care | Payer: 59 | Source: Ambulatory Visit | Attending: Surgery | Admitting: Surgery

## 2012-05-25 ENCOUNTER — Encounter (HOSPITAL_COMMUNITY)
Admission: RE | Disposition: A | Discharge status: Home / Self Care | Payer: Self-pay | Source: Ambulatory Visit | Attending: Surgery

## 2012-05-25 DIAGNOSIS — Z01810 Encounter for preprocedural cardiovascular examination: Secondary | ICD-10-CM | POA: Insufficient documentation

## 2012-05-25 DIAGNOSIS — K219 Gastro-esophageal reflux disease without esophagitis: Secondary | ICD-10-CM | POA: Insufficient documentation

## 2012-05-25 DIAGNOSIS — J479 Bronchiectasis, uncomplicated: Secondary | ICD-10-CM | POA: Insufficient documentation

## 2012-05-25 DIAGNOSIS — R222 Localized swelling, mass and lump, trunk: Secondary | ICD-10-CM | POA: Insufficient documentation

## 2012-05-25 DIAGNOSIS — R599 Enlarged lymph nodes, unspecified: Secondary | ICD-10-CM | POA: Insufficient documentation

## 2012-05-25 DIAGNOSIS — Z01812 Encounter for preprocedural laboratory examination: Secondary | ICD-10-CM | POA: Insufficient documentation

## 2012-05-25 DIAGNOSIS — D381 Neoplasm of uncertain behavior of trachea, bronchus and lung: Secondary | ICD-10-CM

## 2012-05-25 DIAGNOSIS — F17200 Nicotine dependence, unspecified, uncomplicated: Secondary | ICD-10-CM | POA: Insufficient documentation

## 2012-05-25 HISTORY — PX: MEDIASTINOSCOPY: SHX5086

## 2012-05-25 LAB — TYPE AND SCREEN
ABO/RH(D): A POS
Antibody Screen: NEGATIVE

## 2012-05-25 SURGERY — MEDIASTINOSCOPY
Anesthesia: General | Wound class: Clean

## 2012-05-25 MED ORDER — LIDOCAINE HCL (CARDIAC) 20 MG/ML IV SOLN
INTRAVENOUS | Status: DC | PRN
  Administered 2012-05-25: 60 mg via INTRAVENOUS

## 2012-05-25 MED ORDER — CEFUROXIME SODIUM 1.5 G IJ SOLR
INTRAMUSCULAR | Status: AC
  Filled 2012-05-25: qty 1.5

## 2012-05-25 MED ORDER — ONDANSETRON HCL 4 MG/2ML IJ SOLN: 4.0000 mg | Freq: Once | INTRAMUSCULAR | Status: DC | PRN

## 2012-05-25 MED ORDER — HYDROMORPHONE HCL PF 1 MG/ML IJ SOLN: 0.2500 mg | INTRAMUSCULAR | Status: DC | PRN

## 2012-05-25 MED ORDER — PROPOFOL 10 MG/ML IV EMUL
INTRAVENOUS | Status: DC | PRN
  Administered 2012-05-25: 140 mg via INTRAVENOUS

## 2012-05-25 MED ORDER — VECURONIUM BROMIDE 10 MG IV SOLR
INTRAVENOUS | Status: DC | PRN
  Administered 2012-05-25: 1 mg via INTRAVENOUS

## 2012-05-25 MED ORDER — PHENYLEPHRINE HCL 10 MG/ML IJ SOLN
INTRAMUSCULAR | Status: DC | PRN
  Administered 2012-05-25 (×2): 80 ug via INTRAVENOUS

## 2012-05-25 MED ORDER — MIDAZOLAM HCL 5 MG/5ML IJ SOLN
INTRAMUSCULAR | Status: DC | PRN
  Administered 2012-05-25: 2 mg via INTRAVENOUS

## 2012-05-25 MED ORDER — ACETAMINOPHEN 10 MG/ML IV SOLN
INTRAVENOUS | Status: DC | PRN
  Administered 2012-05-25: 1000 mg via INTRAVENOUS

## 2012-05-25 MED ORDER — ONDANSETRON HCL 4 MG/2ML IJ SOLN
INTRAMUSCULAR | Status: DC | PRN
  Administered 2012-05-25: 4 mg via INTRAVENOUS

## 2012-05-25 MED ORDER — GLYCOPYRROLATE 0.2 MG/ML IJ SOLN
INTRAMUSCULAR | Status: DC | PRN
  Administered 2012-05-25: .6 mg via INTRAVENOUS

## 2012-05-25 MED ORDER — NEOSTIGMINE METHYLSULFATE 1 MG/ML IJ SOLN
INTRAMUSCULAR | Status: DC | PRN
  Administered 2012-05-25: 4 mg via INTRAVENOUS

## 2012-05-25 MED ORDER — LACTATED RINGERS IV SOLN
INTRAVENOUS | Status: DC | PRN
  Administered 2012-05-25 (×2): via INTRAVENOUS

## 2012-05-25 MED ORDER — ACETAMINOPHEN 10 MG/ML IV SOLN
INTRAVENOUS | Status: AC
  Filled 2012-05-25: qty 100

## 2012-05-25 MED ORDER — ROCURONIUM BROMIDE 100 MG/10ML IV SOLN
INTRAVENOUS | Status: DC | PRN
  Administered 2012-05-25: 40 mg via INTRAVENOUS
  Administered 2012-05-25: 10 mg via INTRAVENOUS

## 2012-05-25 MED ORDER — FENTANYL CITRATE 0.05 MG/ML IJ SOLN
INTRAMUSCULAR | Status: DC | PRN
  Administered 2012-05-25: 50 ug via INTRAVENOUS
  Administered 2012-05-25: 100 ug via INTRAVENOUS

## 2012-05-25 MED ORDER — DEXTROSE 5 % IV SOLN
INTRAVENOUS | Status: AC
  Filled 2012-05-25: qty 50

## 2012-05-25 SURGICAL SUPPLY — 37 items
BLADE SURG 10 STRL SS (BLADE) ×2 IMPLANT
CANISTER SUCTION 2500CC (MISCELLANEOUS) ×2 IMPLANT
CLOTH BEACON ORANGE TIMEOUT ST (SAFETY) ×2 IMPLANT
CONT SPEC 4OZ CLIKSEAL STRL BL (MISCELLANEOUS) ×4 IMPLANT
COVER SURGICAL LIGHT HANDLE (MISCELLANEOUS) ×4 IMPLANT
DERMABOND ADVANCED (GAUZE/BANDAGES/DRESSINGS) ×1
DERMABOND ADVANCED .7 DNX12 (GAUZE/BANDAGES/DRESSINGS) ×1 IMPLANT
DRAPE LAPAROTOMY T 102X78X121 (DRAPES) ×2 IMPLANT
ELECT CAUTERY BLADE 6.4 (BLADE) ×2 IMPLANT
ELECT REM PT RETURN 9FT ADLT (ELECTROSURGICAL) ×2
ELECTRODE REM PT RTRN 9FT ADLT (ELECTROSURGICAL) ×1 IMPLANT
GLOVE BIOGEL PI IND STRL 7.0 (GLOVE) ×3 IMPLANT
GLOVE BIOGEL PI INDICATOR 7.0 (GLOVE) ×3
GLOVE EUDERMIC 7 POWDERFREE (GLOVE) ×2 IMPLANT
GLOVE EXAM NITRILE XS STR PU (GLOVE) ×2 IMPLANT
GOWN PREVENTION PLUS XLARGE (GOWN DISPOSABLE) ×2 IMPLANT
GOWN STRL NON-REIN LRG LVL3 (GOWN DISPOSABLE) ×2 IMPLANT
HEMOSTAT SURGICEL 2X14 (HEMOSTASIS) IMPLANT
KIT BASIN OR (CUSTOM PROCEDURE TRAY) ×2 IMPLANT
KIT ROOM TURNOVER OR (KITS) ×2 IMPLANT
NS IRRIG 1000ML POUR BTL (IV SOLUTION) ×2 IMPLANT
PACK SURGICAL SETUP 50X90 (CUSTOM PROCEDURE TRAY) ×2 IMPLANT
PAD ARMBOARD 7.5X6 YLW CONV (MISCELLANEOUS) ×4 IMPLANT
PENCIL BUTTON HOLSTER BLD 10FT (ELECTRODE) ×2 IMPLANT
SPONGE GAUZE 4X4 12PLY (GAUZE/BANDAGES/DRESSINGS) ×2 IMPLANT
SPONGE INTESTINAL PEANUT (DISPOSABLE) ×2 IMPLANT
SUT SILK 2 0 TIES 10X30 (SUTURE) IMPLANT
SUT VIC AB 2-0 CT1 27 (SUTURE) ×1
SUT VIC AB 2-0 CT1 TAPERPNT 27 (SUTURE) ×1 IMPLANT
SUT VIC AB 3-0 SH 27 (SUTURE)
SUT VIC AB 3-0 SH 27X BRD (SUTURE) IMPLANT
SUT VICRYL 4-0 PS2 18IN ABS (SUTURE) ×2 IMPLANT
SYRINGE 10CC LL (SYRINGE) ×2 IMPLANT
TOWEL OR 17X24 6PK STRL BLUE (TOWEL DISPOSABLE) ×2 IMPLANT
TOWEL OR 17X26 10 PK STRL BLUE (TOWEL DISPOSABLE) ×2 IMPLANT
TUBE CONNECTING 12X1/4 (SUCTIONS) ×2 IMPLANT
WATER STERILE IRR 1000ML POUR (IV SOLUTION) ×2 IMPLANT

## 2012-05-25 NOTE — Addendum Note (Signed)
Addendum  created 05/25/12 1948 by Jerilee Hoh, CRNA   Modules edited:Anesthesia Responsible Staff

## 2012-05-25 NOTE — Interval H&P Note (Signed)
History and Physical Interval Note:  05/25/2012 7:18 AM  [name] [name]  has presented today for surgery, with the diagnosis of lung lesion  The various methods of treatment have been discussed with the patient and family. After consideration of risks, benefits and other options for treatment, the patient has consented to  Procedure(s) (LRB): MEDIASTINOSCOPY (N/A) as a surgical intervention .  The patient's history has been reviewed, patient examined, no change in status, stable for surgery.  I have reviewed the patients' chart and labs.  Questions were answered to the patient's satisfaction.     Alleen Borne

## 2012-05-25 NOTE — Op Note (Signed)
NAMEGETSEMANI, LINDON NO.:  [id number]  MEDICAL RECORD NO.:  [id number]  LOCATION:  [facility]                         FACILITY:  [facility]  PHYSICIAN:  Evelene Croon, M.D.     DATE OF BIRTH:  [birth year]  DATE OF PROCEDURE:  05/25/2012 DATE OF DISCHARGE:                              OPERATIVE REPORT   PREOPERATIVE DIAGNOSIS:  Left upper lobe cavitary lung mass with mediastinal adenopathy.  POSTOPERATIVE DIAGNOSIS:  Left upper lobe cavitary lung mass with mediastinal adenopathy.  OPERATIVE PROCEDURE:  Mediastinoscopy with mediastinal lymph node biopsies.  SURGEON:  Evelene Croon, MD  ANESTHESIA:  General endotracheal.  CLINICAL HISTORY:  This patient is a 55 year old female smoker with a history of COPD and bronchiectasis who presented with cough, fever, chills, foul-smelling sputum, and general malaise.  Her workup has shown a large cavitary mass in the right upper lobe of the lung.  It has essentially been destroyed most of the right upper lobe.  There are multiple thick internal septations and irregular nodule walls.  She did undergo bronchoscopy with biopsies and brushings by Dr. Launa Grill and all these showed no malignancy.  Culture showed normal flora.  Gram stain showed no white blood cells and no organisms.  She had negative PPD.  She underwent a PET scan after she was referred to me and PET scan did show hypermetabolic uptake within the cavitary right upper lobe process as well as some hypermetabolic activity within the pretracheal and right paratracheal lymph nodes that were enlarged.  I felt that she would require right upper lobectomy whether this was a cancer or a chronic lung abscess due to the large size of it and the chronic debilitating nature of this illness even if it was not lung abscess. She has been losing weight.  I thought we should biopsy the mediastinal lymph nodes to be sure that there was no evidence of metastatic cancer there and if  there are negative, then I would recommend right upper lobectomy.  Her PET scan did not show any other hypermetabolic activity other than the right upper lobe process and these mediastinal lymph nodes.  I discussed the operative procedure of mediastinoscopy with her including alternatives, benefits, and risks including but not limited to bleeding, infection, injury to the mediastinal structures, and pneumothorax.  She understood and agreed to proceed.  OPERATIVE PROCEDURE:  The patient was seen in the preoperative holding area and proper patient and proper operation were confirmed.  Consent was signed and she was taken back to the operating room and placed on table in supine position.  After induction of general endotracheal anesthesia, a roll was placed beneath the shoulders and neck extended. The neck and chest were prepped with Betadine soap and solution, draped in usual sterile manner.  Then, a 1-cm transverse incision was made just above the sternal notch in the midline and carried down through the subcutaneous tissue using electrocautery.  These strap muscles were separated in the midline and retracted laterally to expose the trachea. Pretracheal plane was developed down in the mediastinum.  The mediastinoscope was inserted.  It was advanced down along the trachea and multiple enlarged lymph nodes were encountered.  Lymph  nodes that the R4 station were biopsied and sent for frozen section and these returned showing no cancer.  I also sent lymph nodes from the R2, 7, and pretracheal regions for permanent section.  There was complete hemostasis.  I essentially removed all the lymph nodes that I could find.  The mediastinoscope was withdrawn from the patient.  The strap muscles were reapproximated with interrupted 2-0 Vicryl suture. Platysma muscle was then reapproximated with interrupted 2-0 Vicryl suture and the skin with a 4-0 Vicryl subcuticular skin stitch. Dermabond was applied  over the incision.  Sponge, needle, and instrument counts were correct according to the scrub nurse.  She was awakened, extubated, and transported to the Postanesthesia Care unit in satisfactory and stable condition.     Evelene Croon, M.D.     BB/MEDQ  D:  05/25/2012  T:  05/25/2012  Job:  [id number]

## 2012-05-25 NOTE — Anesthesia Postprocedure Evaluation (Signed)
  Anesthesia Post-op Note  Patient: [name] [name]  Procedure(s) Performed: Procedure(s) (LRB): MEDIASTINOSCOPY (N/A)  Patient Location: PACU  Anesthesia Type: General  Level of Consciousness: awake, alert  and oriented  Airway and Oxygen Therapy: Patient Spontanous Breathing and Patient connected to nasal cannula oxygen  Post-op Pain: mild  Post-op Assessment: Post-op Vital signs reviewed and Patient's Cardiovascular Status Stable  Post-op Vital Signs: stable  Complications: No apparent anesthesia complications

## 2012-05-25 NOTE — Addendum Note (Signed)
Addendum  created 05/25/12 1948 by Yechiel Erny N Mumm, CRNA   Modules edited:Anesthesia Responsible Staff    

## 2012-05-25 NOTE — Progress Notes (Signed)
Xray report WNL. Received from Erskine Squibb in xray reading room.

## 2012-05-25 NOTE — Preoperative (Signed)
Beta Blockers   Reason not to administer Beta Blockers:Not Applicable 

## 2012-05-25 NOTE — Anesthesia Procedure Notes (Signed)
Procedure Name: Intubation Date/Time: 05/25/2012 7:42 AM Performed by: Jerilee Hoh Pre-anesthesia Checklist: Emergency Drugs available, Patient identified, Suction available and Patient being monitored Patient Re-evaluated:Patient Re-evaluated prior to inductionOxygen Delivery Method: Circle system utilized Preoxygenation: Pre-oxygenation with 100% oxygen Intubation Type: IV induction Ventilation: Mask ventilation without difficulty Laryngoscope Size: Mac and 3 Grade View: Grade I Tube type: Oral Tube size: 7.0 mm Number of attempts: 1 Airway Equipment and Method: Stylet Placement Confirmation: ETT inserted through vocal cords under direct vision,  positive ETCO2 and breath sounds checked- equal and bilateral Secured at: 21 cm Tube secured with: Tape Dental Injury: Teeth and Oropharynx as per pre-operative assessment

## 2012-05-25 NOTE — Brief Op Note (Signed)
05/25/2012  10:06 AM  PATIENT:  [name] [name]  56 y.o. female  PRE-OPERATIVE DIAGNOSIS:  lung lesion  POST-OPERATIVE DIAGNOSIS:  Lung lesion  PROCEDURE:  Procedure(s) (LRB): MEDIASTINOSCOPY (N/A)  SURGEON:  Surgeon(s) and Role:    * Alleen Borne, MD - Primary  PHYSICIAN ASSISTANT: none  ASSISTANTS: none   ANESTHESIA:   general  EBL:  Total I/O In: 1750 [P.O.:250; I.V.:1500] Out: 500 [Urine:500]  BLOOD ADMINISTERED:none  DRAINS: none   LOCAL MEDICATIONS USED:  NONE  SPECIMEN:  Source of Specimen:  mediastinal lymph nodes  DISPOSITION OF SPECIMEN:  PATHOLOGY  COUNTS:  YES    PATIENT DISPOSITION:  PACU - hemodynamically stable.   Delay start of Pharmacological VTE agent (>24hrs) due to surgical blood loss or risk of bleeding: not applicable

## 2012-05-25 NOTE — Discharge Instructions (Signed)
You may shower.  Incision is covered with Dermabond surgical adhesive and is waterproof.  Do not apply creams or lotions to incision.  Resume normal activity as tolerated.  May resume driving on 1/61/0960.  Instructions Following General Anesthetic, Adult A nurse specialized in giving anesthesia (anesthetist) or a doctor specialized in giving anesthesia (anesthesiologist) gave you a medicine that made you sleep while a procedure was performed. For as long as 24 hours following this procedure, you may feel:  Dizzy.   Weak.   Drowsy.  AFTER THE PROCEDURE After surgery, you will be taken to the recovery area where a nurse will monitor your progress. You will be allowed to go home when you are awake, stable, taking fluids well, and without complications. For the first 24 hours following an anesthetic:  Have a responsible person with you.   Do not drive a car. If you are alone, do not take public transportation.   Do not drink alcohol.   Do not take medicine that has not been prescribed by your caregiver.   Do not sign important papers or make important decisions.   You may resume normal diet and activities as directed.   Change bandages (dressings) as directed.   Only take over-the-counter or prescription medicines for pain, discomfort, or fever as directed by your caregiver.  If you have questions or problems that seem related to the anesthetic, call the hospital and ask for the anesthetist or anesthesiologist on call. SEEK IMMEDIATE MEDICAL CARE IF:   You develop a rash.   You have difficulty breathing.   You have chest pain.   You develop any allergic problems.  Document Released: 02/27/2001 Document Revised: 11/10/2011 Document Reviewed: 10/08/2007 [facility] Patient Information 2012 [place], [place].

## 2012-05-25 NOTE — Anesthesia Preprocedure Evaluation (Addendum)
Anesthesia Evaluation  Patient identified by MRN, date of birth, ID band Patient awake    Reviewed: Allergy & Precautions, H&P , NPO status , Patient's Chart, lab work & pertinent test results, reviewed documented beta blocker date and time   Airway Mallampati: II TM Distance: >3 FB Neck ROM: Full    Dental  (+) Dental Advisory Given and Teeth Intact   Pulmonary COPD breath sounds clear to auscultation        Cardiovascular Rhythm:Regular Rate:Normal     Neuro/Psych  Headaches, PSYCHIATRIC DISORDERS    GI/Hepatic GERD-  Controlled,  Endo/Other    Renal/GU      Musculoskeletal   Abdominal   Peds  Hematology   Anesthesia Other Findings   Reproductive/Obstetrics                          Anesthesia Physical Anesthesia Plan  ASA: III  Anesthesia Plan: General   Post-op Pain Management:    Induction: Intravenous  Airway Management Planned: Oral ETT  Additional Equipment:   Intra-op Plan:   Post-operative Plan:   Informed Consent:   Dental advisory given  Plan Discussed with: Anesthesiologist  Anesthesia Plan Comments: (Cavitary R. Upper Lobe Mass Smoker COPD Anxiety  Plan GA  Kipp Brood, MD)      Anesthesia Quick Evaluation

## 2012-05-25 NOTE — Transfer of Care (Signed)
Immediate Anesthesia Transfer of Care Note  Patient: [name] [name]  Procedure(s) Performed: Procedure(s) (LRB): MEDIASTINOSCOPY (N/A)  Patient Location: PACU  Anesthesia Type: General  Level of Consciousness: awake, alert , oriented and patient cooperative  Airway & Oxygen Therapy: Patient Spontanous Breathing and Patient connected to nasal cannula oxygen  Post-op Assessment: Report given to PACU RN, Post -op Vital signs reviewed and stable and Patient moving all extremities  Post vital signs: Reviewed and stable  Complications: No apparent anesthesia complications

## 2012-05-25 NOTE — H&P (Signed)
[address]            [name] [address]          [phone]      PCP is Willow Ora, MD, MD Referring Provider is Sandrea Hughs, MD    Chief Complaint   Patient presents with   .  Lung Mass          HPI:  The patient is a 56 year old white female smoker with a history of COPD and bronchiectasis who reports having a pulmonary illness last fall manifested by fever, chills, coughing up foul-smelling sputum, and general malaise. She did not have health insurance at that time and did not seek further workup. She said that she took a brief course of antibiotics that she already has at home including amoxicillin and Avelox for a total of about 7 days. She said that she gradually improved. She said that she finally got health insurance as of January 2013 and proceeded with workup by Dr. Drue Novel. Chest x-ray showed a new large cavitary mass in the right lung apex. A CT scan of the chest likewise showed a 6.5 x 6.0 cm thick walled, irregular, cavitary lesion in the right lung apex with multiple thick internal septations and irregular nodular walls. It was felt that this could represent a mycetoma or a cavitary neoplasm. In the lateral aspect of the right upper lobe there is a more focal pleural-based mass-like opacity that had a few small internal air bronchograms suggesting an area of air space consolidation rather than a true mass. She reports that over the past few months she has had significant dull pain over the right anterior chest as well as over the right rib cage laterally. This has not been made worse by any specific movement or coughing. She notes no change in her voice. She has had no numbness or weakness in her right upper extremity. She said that she is no longer coughing up any sputum. She has had no recent fever or chills. She does report a 20 weight loss since last fall which she attributes to decreased appetite. She continues to smoke about 15-20 cigarettes  per day but has much more in the past. She underwent flexible fiberoptic bronchoscopy by Dr.Wert which showed no visible abnormality within the tracheobronchial tree. Biopsies of the right upper lobe showed no evidence of malignancy or fungus. Cytology of washings showed no malignant cells. Culture of the right upper lobe showed only normal flora. Gram stain showed no white blood cells and no organisms. We discussed her recent PET scan and my interpretation of the results as noted above as well as my recommendation that she is going to require right upper lobectomy. She was hesitant to have any kind of surgery at that point due to her fear of losing her job and returned to see her primary physician and was started on antibiotics. She said that her fever resolved and her sputum production has markedly decreased although she is still coughing up some foul-smelling sputum at times. She also reports developing some hemoptysis. She initially coughed up about 2 teaspoons of maroon- colored blood one  day and then coughed up a smaller amount daily for the next 5 days. She reports no hemoptysis for the past 2 weeks. She returned to see her primary physician, Dr. Drue Novel, and I discussed the case with him by telephone. He encouraged  her to return to see me to discuss this further. She said that she still has some right-sided chest discomfort. She's had no recent fever or chills. She does report she has been eating well but still losing weight. She has continued to work full-time. She continues to smoke about one pack of cigarettes per day on the weekend and a little less during the week when she is working.        Past Medical History   Diagnosis  Date   .  Asthma         adult onset   .  COPD (chronic obstructive pulmonary disease)         per CT 11/10/05, will minimal bronchiectasis rll   .  GERD (gastroesophageal reflux disease)     .  Osteoporosis         dexa per gyn   .  Colitis         induced by  decongestants, NSAIds   .  Headache     .  Lichen planus     .  B12 deficiency     .  Endometriosis     .  Cataracts, bilateral         Past Surgical History   Procedure  Date   .  Knee surgery         x 8   .  Ovarian cyst removed     .  Video bronchoscopy  02/15/2012       Procedure: VIDEO BRONCHOSCOPY WITH FLUORO;  Surgeon: Nyoka Cowden, MD;  Location: [facility];  Service: Endoscopy;  Laterality: Bilateral;  WASHING- INTERVENTION BIOPSIES INTERVENTION BRONCHOSCOPY WITH VIDEO       Family History   Problem  Relation  Age of Onset   .  Colon cancer  Neg Hx     .  Breast cancer  Neg Hx     .  Diabetes  Mother         GM   .  Heart attack           ?   Marland Kitchen  Heart disease  Mother     .  Heart disease  Maternal Grandfather     .  Cancer  Father         sarcoma     Social History History   Substance Use Topics   .  Smoking status:  Current Everyday Smoker -- 1.0 packs/day for 38 years       Types:  Cigarettes   .  Smokeless tobacco:  Never Used   .  Alcohol Use:  0.0 oz/week         wine occ       Current Outpatient Prescriptions   Medication  Sig  Dispense  Refill   .  ALPRAZolam (XANAX) 0.5 MG tablet  1/2-1 by mouth at bedtime as needed for insomnia.   90 tablet   0   .  aspirin 81 MG tablet  Take 81 mg by mouth daily.           .  Calcium Carbonate-Vitamin D (CALTRATE 600+D) 600-400 MG-UNIT per chew tablet  Chew 1 tablet by mouth daily.           .  Hydrocodone-Acetaminophen 10-660 MG TABS  Take 1 tablet by mouth 3 (three) times daily.   90 each   3   .  nicotine polacrilex (NICORETTE) 4 MG lozenge  Place 4 mg inside cheek as  needed.           Marland Kitchen  omeprazole (PRILOSEC) 20 MG capsule  Take 20 mg by mouth daily.          .  quiNINE (QUALAQUIN) 324 MG capsule  Take 648 mg by mouth as needed.           .  vitamin B-12 (CYANOCOBALAMIN) 100 MCG tablet  Take 50 mcg by mouth daily.           .  calcium carbonate (TUMS - DOSED IN MG ELEMENTAL CALCIUM) 500 MG chewable tablet   Chew 1 tablet by mouth daily.               Allergies   Allergen  Reactions   .  Alendronate Sodium         REACTION: chest pain   .  Antihistamines, Chlorpheniramine-Type         "makes lungs bleed"   .  Levofloxacin     .  Nsaids         REACTION: ischemic colitis     Review of Systems:  Gen.: She denies any recent fever or chills. She has had 20 pound weight loss since last fall. She denies fatigue. Eyes: Negative ENT: Negative Endocrine: She denies diabetes and hypothyroidism. Cardiovascular: She has had right anterior chest discomfort but is not associated with exertion. She denies exertional dyspnea, orthopnea, and PND. She's had no peripheral edema or palpitations. Respiratory: She denies cough and sputum production at this time. She denies hemoptysis. GI: She has had no nausea or vomiting. She denies dysphagia. She's had no melena or bright red blood per rectum. GU: She denies dysuria and hematuria. Neurological: She denies any focal weakness or numbness. She denies dizziness and syncope. She's never had a TIA or stroke. Musculoskeletal: She does report pain over the right anterior chest wall as well as in the right lateral rib cage. She has a history of significant degenerative joint disease of both knees. She had seven surgeries on the right and one on the left. Psychiatric: negative  BP 118/72  Pulse 86  Temp 97.2 F (36.2 C)  Resp 16  Ht 5\' 7"  (1.702 m)  Wt 121 lb (54.885 kg)  BMI 18.95 kg/m2  SpO2 99% Physical Exam:  She is a thin, white female in no distress. HEENT: Normocephalic and atraumatic. Pupils are equal and reactive to light and accommodation. Extraocular muscles are intact. Oropharynx is clear. Neck: Carotid pulses are palpable bilaterally. There are no bruits. There are a few small palpable lymph nodes in both cervical regions. There is significant tenderness to palpation of the right supraclavicular fossa but no palpable abnormality there. There is  no thyromegaly. Lungs: Decreased breath sounds throughout that are clear. Chest wall: There is tenderness to palpation along the right lateral rib cage. There is no visible abnormality. Heart: Regular rate and rhythm with normal S1 and S2. There is no murmur, rub, or gallop. Abdomen: Bowel sounds are present. Soft and nontender. There are no palpable masses or organomegaly. Extremities: There is no peripheral edema. Pedal pulses are palpable bilaterally. Scars on both knees from prior knee surgery. Neurological: Alert and oriented x3. Motor and sensory exams are grossly normal.  Diagnostic Tests:  CHEST - 2 VIEW   Comparison: 07/29/2008 2006   Findings: A thick-walled cystic area measuring approximately 6 x 4 cm is seen in the right lung apex in the region of the patient's known right upper lobe emphysematous change.  More focal alveolar infiltrate is also identified laterally in the right upper lobe with some associated volume loss suggested by elevation of the minor fissure.  These findings are most suspicious for either a post infectious process or cavitary malignancy.  Further evaluation with chest CT with contrast is recommended.   Milder emphysematous changes are again identified in the left upper lung zone.  The remainder of the lung fields are clear.   Heart and mediastinal contours are within normal limits.  No pleural fluid is seen.  Bony structures appear intact. A well- circumscribed radiopaque density in the left upper quadrant has a shape suggestive of a pill within the splenic flexure.   IMPRESSION: Findings suggestive of new thick-walled cavitary change in the right upper lobe with some associated alveolar infiltrates and volume loss further assessment with chest CT with contrast is recommended for complete evaluation.   Chronic emphysematous changes in the left lung apex.    CT CHEST WITH CONTRAST   Technique:  Multidetector CT imaging of the chest was  performed following the standard protocol during bolus administration of intravenous contrast.   Contrast: 80mL OMNIPAQUE IOHEXOL 300 MG/ML IV SOLN   Comparison: Chest x-ray 12/29/2011.   Findings: Mediastinum:  Heart size is normal.  No pericardial fluid, thickening or calcification.  No acute abnormality of the thoracic aorta or other great vessels of the mediastinum.  No filling defects are noted within the central pulmonary arterial tree to suggest large pulmonary emboli.  Small calcified low right paratracheal lymph node.  No pathologically enlarged mediastinal or hilar lymph nodes.  The esophagus is normal in appearance.   Lungs/Pleura: Within the apex of the right upper lobe there is a large thick-walled irregular shaped cavitary lesion that measures at least 6.5 x 6.0 cm on axial image #12 of series 3, and measures approximately 5.3 cm in craniocaudal span. In addition to multiple thick internal septations and irregular nodular walls, in the inferior aspect of the lesion there is a focal area of soft tissue density with an adjacent "air crescent" (demonstrated on image 18 of series 3), which could represent a small mycetoma, or could be a solid soft tissue (i.e., evidence of neoplasm).  In the lateral aspect of the right upper lobe (images 16 - 20 of series 3) there is a more focal pleural based mass like opacity measures approximately 3.7 x 3.2 cm, however, this lesion appears to have a few small internal air bronchograms suggesting that it is an area of airspace consolidation, rather than a true mass.  Best demonstrated on image 17 of series 3 where it measures 2.9 x 1.1 cm).  Background of mild-moderate centrilobular and paraseptal emphysema, most severe in the upper lobes lungs bilaterally.  No pleural effusions.   Upper Abdomen:  Within the visualized portions of the liver they are too small low attenuation lesions, the largest of which measures 1 cm in segment five  (image 61 of series 2), which are too small to definitively characterize, they are favored to represent cysts.   Musculoskeletal:  No aggressive appearing lytic or blastic lesions are noted in the visualized portions of the skeleton.   IMPRESSION:   1.  Irregular shaped thick-walled cavitary lesion in the apex of the right upper lobe that measures at least 6.5 x 6.0 x 5.3 cm. The appearance of this lesion is aggressive, but nonspecific, and differential considerations include sequelae of infection (including atypical organisms such as mycobacterium tuberculosis), or even a thick walled cavitary neoplasm.  In the inferior aspect of the lesion (image 18 of series 3) there is a focal area of soft tissue prominence with a "air crescent" sign, that may either represent a focal soft tissue nodule, or may represent superinfection with organisms such as aspergillus (i.e., a mycetoma).  The adjacent mass-like airspace opacity in the periphery of the right upper lobe (3.7 x 3.2 cm) is favored to represent an area of consolidation, rather than a true mass as there are internal air bronchograms.  There is also a 2.9 x 1.1 cm elliptical shaped soft tissue lesion along the inferior aspect of the cavity in contact with the minor fissure.   The most immediate concern would be whether or not the above findings are sequela of tuberculosis infection.  Accordingly, clinical correlation with consideration for respiratory isolation and sputum testing is highly recommended.  Critical Value/emergent results were called by telephone at the time of interpretation on 01/04/2012  at 07:55 a.m.  to  Dr. Drue Novel, who verbally acknowledged these results.   2. Small low attenuation lesions in the liver are similar to remote prior study from 2008, and are favored to represent cysts. 3.  Background of mild-moderate central lobular and paraseptal emphysema.   Original Report Authenticated By: Florencia Reasons,  M.D.    NUCLEAR MEDICINE PET SKULL BASE TO THIGH   Fasting Blood Glucose:  90   Technique:  15.4 mCi F-18 FDG was injected intravenously. CT data was obtained and used for attenuation correction and anatomic localization only.  (This was not acquired as a diagnostic CT examination.) Additional exam technical data entered on technologist worksheet.   Comparison:  01/04/2012   Findings:   Neck:  No hypermetabolic mass or lymph nodes within the soft tissues of the neck.   Chest:  Right paratracheal and precarinal lymph nodes exhibit mild increased FDG uptake.  The SUV max associated with the precarinal lymph node is equal to 4.1, image number 83.   Extensive thick-walled cavitary process involving the right upper lobe is identified.  This measures approximately 6.2 x 6.5 x 6.6 cm. On the previous exam this measured 5.4 x 5.5 x 4.9 cm. On today's exam, there is increased FDG uptake identified within the walls  and soft tissue components of this lesion.  The SUV max within this area is equal to 6.0.   Along the medial periphery of this mass there is a focal area of intense FDG uptake measuring 1.3 cm.  This has an SUV max equal to 6.8, image 84.   No abnormal uptake within the right lower lobe or right middle lobe.  There is no abnormal activity within the left lung.   Abdomen/Pelvis:  No abnormal hypermetabolic activity within the liver, pancreas, adrenal glands, or spleen.  No hypermetabolic lymph nodes in the abdomen or pelvis.   [place]:  No focal hypermetabolic activity to suggest skeletal metastasis.   IMPRESSION:   1. Large cavitary lesion involving the right upper lobe exhibits malignant range FDG uptake within the walls and soft tissue portions of this lesion. 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  A similar appearance could also be seen with indolent infection or inflammation. 2.  Enlarged and hypermetabolic right paratracheal and  precarinal lymph nodes.  Cannot rule out metastatic adenopathy.   Original Report Authenticated By: Rosealee Albee, M.D.     Impression:  She has a large cavitary process involving the apical portion of the right upper lobe of undetermined etiology. Her initial presenting  complaints last fall suggest an infectious process such as a lung abscess although I'm surprised that her symptoms were completely resolved after a brief course of oral antibiotics. Tuberculosis might also be of concern although she has had 2 negative PPD's and her bronchoscopic culture was negative. A cavitary lung cancer is also a concern especially given her 20 pound weight loss, heavy smoking history, and significant chest wall symptoms.   Plan:    Mediastinoscopy to assess the hypermetabolic lymph nodes.

## 2012-05-31 ENCOUNTER — Encounter (HOSPITAL_COMMUNITY): Payer: Self-pay | Admitting: Surgery

## 2012-06-04 ENCOUNTER — Ambulatory Visit (INDEPENDENT_AMBULATORY_CARE_PROVIDER_SITE_OTHER): Payer: Self-pay | Admitting: Surgery

## 2012-06-04 VITALS — BP 124/83 | HR 92 | Resp 18 | Ht 67.0 in | Wt 120.0 lb

## 2012-06-04 DIAGNOSIS — J852 Abscess of lung without pneumonia: Secondary | ICD-10-CM

## 2012-06-04 DIAGNOSIS — Z09 Encounter for follow-up examination after completed treatment for conditions other than malignant neoplasm: Secondary | ICD-10-CM

## 2012-06-04 NOTE — Progress Notes (Signed)
[address]            [name] [address]          [phone]       HPI:  Patient returns for routine postoperative follow-up having undergone mediastinoscopy with lymph node biopsies on 05/31/2012. The patient's early postoperative recovery while in the hospital was notable for an uncomplicated course. Since hospital discharge the patient reports that she has been feeling well.   Current Outpatient Prescriptions  Medication Sig Dispense Refill  . ALPRAZolam (XANAX) 0.5 MG tablet Take 0.25-0.5 mg by mouth at bedtime as needed. as needed for insomnia.      Marland Kitchen aspirin 81 MG tablet Take 81 mg by mouth daily.        . calcium carbonate (TUMS - DOSED IN MG ELEMENTAL CALCIUM) 500 MG chewable tablet Chew 1 tablet by mouth daily.        Marland Kitchen HYDROcodone-acetaminophen (LORCET) 10-650 MG per tablet Take 1 tablet by mouth every 6 (six) hours as needed. For pain      . nicotine polacrilex (NICORETTE) 4 MG lozenge Place 4 mg inside cheek as needed.        Marland Kitchen omeprazole (PRILOSEC) 20 MG capsule Take 20 mg by mouth daily.       . quiNINE (QUALAQUIN) 324 MG capsule Take 648 mg by mouth daily as needed. For foot cramps      . vitamin B-12 (CYANOCOBALAMIN) 100 MCG tablet Take 50 mcg by mouth daily.          Physical Exam:  BP 124/83  Pulse 92  Resp 18  Ht 5\' 7"  (1.702 m)  Wt 120 lb (54.432 kg)  BMI 18.79 kg/m2  SpO2 98% She looks well. Lung exam is clear.  Diagnostic Tests:  Patient Name: [name] [name], [name] [name] Accession #: [id number] DOB: Nov 03, 1956 Age: 56 Gender: F Client Name Eligha Bridegroom. [facility] Collected Date: 05/25/2012 Received Date: 05/25/2012 Physician: Evelene Croon Chart #: MRN # : [id number] Physician cc: Race:W Visit #: [id number] REPORT OF SURGICAL PATHOLOGY FINAL DIAGNOSIS Diagnosis 1. Lymph node, biopsy, R4 - ONE LYMPH NODE, NEGATIVE FOR TUMOR (0/1). 2. Lymph node, biopsy, L7 - ONE LYMPH NODE, NEGATIVE FOR TUMOR (0/1). 3. Lymph  node, biopsy, Paratracheal R2 - ONE LYMPH NODE, NEGATIVE FOR TUMOR (0/1). 4. Lymph node, biopsy, Pretracheal - ONE LYMPH NODE, NEGATIVE FOR TUMOR (0/1). [place] RUND DO Pathologist, Electronic Signature (Case signed 05/28/2012) Intraoperative  Impression:  She has a large cavitary process in the right upper lobe which has not changed at all when I compare her CT scan the chest on 01/04/2012 and her PET scan from 03/19/2012. The hypermetabolic mediastinal lymph nodes are all negative and I tried to completely remove all of them. These were most likely reactive to the right upper lobe process. 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 If this was a cancer I would expect significant change in the appearance on CT scan from January 30 to April 15. I recommended right upper lobectomy to prevent repeated episodes of infection that could potentially spread to other lobes as well as the risk of massive hemoptysis. I think the only way that we would be sure that there is not an associated cancer would be  to completely remove it. I discussed all this with her but she said that she really is not interested in having surgery. She is feeling fairly well right now and is concerned about losing her job which is a realistic concern. I strongly advised her to get help with smoking cessation.  Plan:  She will continue followup with her primary physician, Dr. Drue Novel. I'm not sure that it is worth repeating her CT scan the chest unless she worsens clinically since she is not interested in surgical therapy as recommended. I told her I would be happy to see her back if the need arises.

## 2012-06-13 ENCOUNTER — Encounter: Payer: Self-pay | Admitting: Internal Medicine

## 2012-06-13 ENCOUNTER — Ambulatory Visit (INDEPENDENT_AMBULATORY_CARE_PROVIDER_SITE_OTHER): Payer: 59 | Admitting: Internal Medicine

## 2012-06-13 VITALS — BP 116/72 | HR 97 | Temp 98.6°F | Wt 113.0 lb

## 2012-06-13 DIAGNOSIS — J449 Chronic obstructive pulmonary disease, unspecified: Secondary | ICD-10-CM

## 2012-06-13 DIAGNOSIS — R918 Other nonspecific abnormal finding of lung field: Secondary | ICD-10-CM

## 2012-06-13 DIAGNOSIS — R634 Abnormal weight loss: Secondary | ICD-10-CM

## 2012-06-13 DIAGNOSIS — F17200 Nicotine dependence, unspecified, uncomplicated: Secondary | ICD-10-CM

## 2012-06-13 DIAGNOSIS — M1990 Unspecified osteoarthritis, unspecified site: Secondary | ICD-10-CM

## 2012-06-13 DIAGNOSIS — Z23 Encounter for immunization: Secondary | ICD-10-CM

## 2012-06-13 DIAGNOSIS — R222 Localized swelling, mass and lump, trunk: Secondary | ICD-10-CM

## 2012-06-13 DIAGNOSIS — G4700 Insomnia, unspecified: Secondary | ICD-10-CM

## 2012-06-13 DIAGNOSIS — R0789 Other chest pain: Secondary | ICD-10-CM

## 2012-06-13 DIAGNOSIS — R071 Chest pain on breathing: Secondary | ICD-10-CM

## 2012-06-13 MED ORDER — AZITHROMYCIN 250 MG PO TABS: ORAL_TABLET | ORAL | Status: AC

## 2012-06-13 NOTE — Assessment & Plan Note (Addendum)
Status post a mediastinoscopy with lymph node showing no cancer. Dr. Laneta Simmers recommended surgery to remove the right upper lobe cavitary mass. The patient asked my opinion, I totally agree with her surgeon.  She again states she's not ready for that mostly d/t finances and her job. Patient request a prescription for a Z-Pak to be taken in case she get sick during the weekend, she believes that  early antibiotics will prevent a more serious lung infection. I reluctantly agreed to do that, the agreement is that if she starts to get sick, she will start antibiotics and call the office or go to the urgent care.

## 2012-06-13 NOTE — Assessment & Plan Note (Signed)
Has lost a few more pounds, likely related to cavitary lesion in the lung.

## 2012-06-13 NOTE — Assessment & Plan Note (Addendum)
Today complains of pain at the right sided chest close to the epigastric area and mostly related to eating large meals. Plan: Observation

## 2012-06-13 NOTE — Progress Notes (Signed)
  Subjective:    Patient ID: [name] [name], female    DOB: July 05, 1956, 56 y.o.   MRN: [id number]  HPI Routine visit Likes to discuss her recent mediastinoscopy, chart is reviewed, see assessment and plan  Past Medical History:  Right upper lobe of cavitary lesion Asthma,adult onset  COPD, per CT 11/10/05, with minimal bronchiectasis rll  GERD  Osteoarthritis  Osteoporosis--Dexa per gyn  Endometriosis  Recurrent isch colitis  Tobacco abuse  Headaches  LICHEN PLANUS  Hx of B12 DEFICIENCY  Cataracts   Past Surgical History:  Knee surgeries x 8  ovarian cyst removal  Bronchoscopy 02-15-12 mediastinoscopy 05/2012  Social History:  Single, no children  mom and sister live in her house  Works at [employer]  Current smoker since age 8. Still smokes  Drinks wine daily    Review of Systems In general feels well, despite that,  she has lost a few more pounds. Continue  having pain at the right anterior chest close to the epigastric area, pain is mostly triggered by eating a large meal, started back in April after a episode of severe coughing Denies nausea, vomiting, dysphasia, odynophagia. She occasionally has heartburn, good compliance with PPIs.     Objective:   Physical Exam  Abdominal:      General -- alert, well-developed, and slt underrweight appearing. No apparent distress.  Lungs -- normal respiratory effort, no intercostal retractions, no accessory muscle use, and normal breath sounds.   Heart-- normal rate, regular rhythm, no murmur, and no gallop.   Abdomen--soft, non-tender, no distention, no masses, no HSM, no guarding, and no rigidity.see graphic  Extremities-- no pretibial edema bilaterally  Psych-- Cognition and judgment appear intact. Alert and cooperative with normal attention span and concentration.  not anxious appearing and not depressed appearing.       Assessment & Plan:  Today , I spent more than 25 min with the patient, >50% of the time counseling, and   reviewing the chart

## 2012-06-13 NOTE — Assessment & Plan Note (Signed)
on hydrocodone as needed 

## 2012-06-13 NOTE — Assessment & Plan Note (Signed)
Counseled, not ready to quit 

## 2012-06-13 NOTE — Assessment & Plan Note (Signed)
Pneumonia shot today (reports a small local reaction before, no systemic symptoms)

## 2012-06-13 NOTE — Assessment & Plan Note (Signed)
Well-controlled with alprazolam

## 2012-06-20 ENCOUNTER — Other Ambulatory Visit: Payer: Self-pay | Admitting: Internal Medicine

## 2012-06-20 NOTE — Telephone Encounter (Signed)
Ok to refill 

## 2012-06-20 NOTE — Telephone Encounter (Signed)
62, no RF 

## 2012-06-20 NOTE — Telephone Encounter (Signed)
Refill done.  

## 2012-07-10 ENCOUNTER — Other Ambulatory Visit: Payer: Self-pay | Admitting: Internal Medicine

## 2012-07-10 NOTE — Telephone Encounter (Signed)
Done, 60, no Rf

## 2012-07-10 NOTE — Telephone Encounter (Signed)
Ok to refill 

## 2012-07-31 ENCOUNTER — Other Ambulatory Visit: Payer: Self-pay | Admitting: Internal Medicine

## 2012-07-31 NOTE — Telephone Encounter (Signed)
62, 3 RF 

## 2012-07-31 NOTE — Telephone Encounter (Signed)
Ok to refill 

## 2012-07-31 NOTE — Telephone Encounter (Signed)
Refill done.  

## 2012-08-10 ENCOUNTER — Ambulatory Visit (INDEPENDENT_AMBULATORY_CARE_PROVIDER_SITE_OTHER): Payer: 59 | Admitting: Internal Medicine

## 2012-08-10 VITALS — BP 122/80 | HR 98 | Temp 98.6°F | Wt 112.0 lb

## 2012-08-10 DIAGNOSIS — R918 Other nonspecific abnormal finding of lung field: Secondary | ICD-10-CM

## 2012-08-10 DIAGNOSIS — R222 Localized swelling, mass and lump, trunk: Secondary | ICD-10-CM

## 2012-08-10 MED ORDER — AMOXICILLIN-POT CLAVULANATE ER 1000-62.5 MG PO TB12: 2.0000 | ORAL_TABLET | Freq: Two times a day (BID) | ORAL | Status: AC

## 2012-08-10 NOTE — Patient Instructions (Addendum)
Cough suppression with Mucinex DM Take antibiotics for 10 days I'm going to arrange a visit with cardiothoracic surgery ASAP ER if you keep coughing blood, have fever chills or you feel worse. No aspirin    Please get your x-ray at the other [facility]  office located at: [address] [place], across from [facility].  Please go to the basement, this is a walk-in facility, they are open from 8:30 to 5:30 PM. Phone number [phone].

## 2012-08-10 NOTE — Assessment & Plan Note (Addendum)
Well known lung mass, patient has declined surgery, presents with hemoptysis. Case discussed with pulmonary, she really needs to see thoracic surgery for definite treatment. She is at risk of serious bleeding and death. Patient is aware of that. Plan: Antibiotics Refer to thoracic surgery ASAP. ER if fever chills or more hemoptysis. No ASA  chest x-ray

## 2012-08-10 NOTE — Progress Notes (Signed)
  Subjective:    Patient ID: [name] [name], female    DOB: 1956/05/25, 56 y.o.   MRN: [id number]  HPI Acute visit 4 days ago, had an acute onset  hemoptysis, bright red blood, about 3 tablespoons, since then she has seen blood sporadically, today she has cough up greenish sputum but only streaks of red blood. Request a CT of the chest   Past Medical History:   Right upper lobe of cavitary lesion Asthma,adult onset   COPD, per CT 11/10/05, with minimal bronchiectasis rll   GERD   Osteoarthritis   Osteoporosis--Dexa per gyn   Endometriosis   Recurrent isch colitis   Tobacco abuse   Headaches   LICHEN PLANUS   Hx of B12 DEFICIENCY   Cataracts    Past Surgical History:   Knee surgeries x 8   ovarian cyst removal   Bronchoscopy 02-15-12 mediastinoscopy 05/2012  Social History:   Single, no children   mom and sister live in her house   Works at [employer]   Current smoker since age 73. Still smokes   Drinks wine daily    Review of Systems Denies fever or chills No nausea, vomiting, diarrhea. She continue to smoke     Objective:   Physical Exam  General -- alert, well-developed, and slightly under weight appearing   Neck --no lymphadenopathies, no mass at the supraclavicular areas, right supraclavicular area slightly tender. Lungs -- slightly decreased breath sounds but otherwise clear Heart-- normal rate, regular rhythm, no murmur, and no gallop.   Neurologic-- alert & oriented X3 and strength normal in all extremities. Psych-- Cognition and judgment appear intact. Alert and cooperative with normal attention span and concentration.  not anxious appearing and not depressed appearing.       Assessment & Plan:  Today , I spent more than 25 min with the patient, >50% of the time counseling, and coordinating her care, see a/p

## 2012-08-12 ENCOUNTER — Encounter: Payer: Self-pay | Admitting: Internal Medicine

## 2012-08-12 ENCOUNTER — Ambulatory Visit (HOSPITAL_COMMUNITY)
Admission: RE | Admit: 2012-08-12 | Discharge: 2012-08-12 | Disposition: A | Discharge status: Home / Self Care | Payer: 59 | Source: Ambulatory Visit | Attending: Internal Medicine | Admitting: Internal Medicine

## 2012-08-12 DIAGNOSIS — R918 Other nonspecific abnormal finding of lung field: Secondary | ICD-10-CM

## 2012-08-12 DIAGNOSIS — J984 Other disorders of lung: Secondary | ICD-10-CM | POA: Insufficient documentation

## 2012-08-12 DIAGNOSIS — R042 Hemoptysis: Secondary | ICD-10-CM | POA: Insufficient documentation

## 2012-08-13 ENCOUNTER — Other Ambulatory Visit: Payer: Self-pay | Admitting: Surgery

## 2012-08-13 DIAGNOSIS — R042 Hemoptysis: Secondary | ICD-10-CM

## 2012-08-15 ENCOUNTER — Ambulatory Visit (INDEPENDENT_AMBULATORY_CARE_PROVIDER_SITE_OTHER): Payer: 59 | Admitting: Surgery

## 2012-08-15 ENCOUNTER — Ambulatory Visit (HOSPITAL_COMMUNITY)
Admission: RE | Admit: 2012-08-15 | Discharge: 2012-08-15 | Disposition: A | Discharge status: Home / Self Care | Payer: 59 | Source: Ambulatory Visit | Attending: Surgery | Admitting: Surgery

## 2012-08-15 ENCOUNTER — Encounter: Payer: 59 | Admitting: Surgery

## 2012-08-15 ENCOUNTER — Encounter: Payer: Self-pay | Admitting: Surgery

## 2012-08-15 VITALS — BP 117/78 | HR 96 | Resp 20 | Ht 67.0 in | Wt 112.6 lb

## 2012-08-15 DIAGNOSIS — R042 Hemoptysis: Secondary | ICD-10-CM | POA: Insufficient documentation

## 2012-08-15 DIAGNOSIS — R222 Localized swelling, mass and lump, trunk: Secondary | ICD-10-CM | POA: Insufficient documentation

## 2012-08-15 DIAGNOSIS — J852 Abscess of lung without pneumonia: Secondary | ICD-10-CM

## 2012-08-15 NOTE — Progress Notes (Signed)
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 Since I have been seeing her she has had a few acute exacerbations of infection coughing up yellowish-green, foul-smelling sputum that resolved with antibiotics. She has also had some hemoptysis that has resolved. She had a PET scan previously which showed hypermetabolic uptake within the right upper lobe mass as well as within some right paratracheal lymph nodes. There was some concern about whether this could be a cavitary lung cancer. We did mediastinoscopy with lymph node biopsies and they were all negative for malignancy showing only chronic inflammation. I have recommended right upper lobectomy for removal of the destroyed right upper lobe which has been a source of recurrent infection and hemoptysis. She has refused surgery so far because she has not worked long enough at [employer] to qualify for [employer]. She said that last Thursday she began having new onset of hemoptysis which she described as bright red blood, probably a few tablespoons. About the same time she also noticed reflux of gastric contents up into her throat when lying in bed at night, which she has had in the past. A few days after the hemoptysis started she began having yellowish-green sputum again as well as low-grade fever. She saw Dr. Drue Novel and she was started on Augmentin and an appointment was made to see me. We repeated her CT scan of the chest which shows a slight decrease in the size and wall thickness of the right upper lobe cavitary mass. She said that since starting antibiotics the hemoptysis has improved and her sputum now has a slight orange tinge but there is no bright red blood. Her maximum temperature today was 99. She denies any chills. She denies shortness of breath. She has also noticed increased pain in the  right shoulder and right upper chest since this new hemoptysis started. She continues to smoke and said that some days she smokes more, and some less.  Current Outpatient Prescriptions  Medication Sig Dispense Refill  . ALPRAZolam (XANAX) 0.5 MG tablet Take 0.25-0.5 mg by mouth at bedtime as needed. as needed for insomnia.      Marland Kitchen amoxicillin-clavulanate (AUGMENTIN XR) 1000-62.5 MG per tablet Take 2 tablets by mouth 2 (two) times daily.  40 tablet  0  . calcium carbonate (TUMS - DOSED IN MG ELEMENTAL CALCIUM) 500 MG chewable tablet Chew 1 tablet by mouth daily.        . GuaiFENesin (MUCINEX PO) Take by mouth daily.      Marland Kitchen HYDROcodone-acetaminophen (LORCET) 10-650 MG per tablet TAKE 1 TABLET BY MOUTH EVERY 8 HOURS AS NEEDED FOR PAIN  60 tablet  3  . nicotine polacrilex (NICORETTE) 4 MG lozenge Place 4 mg inside cheek as needed.        Marland Kitchen omeprazole (PRILOSEC) 20 MG capsule Take 20 mg by mouth daily.       . quiNINE (QUALAQUIN) 324 MG capsule Take 648 mg by mouth daily as needed. For foot cramps      . vitamin B-12 (CYANOCOBALAMIN) 100 MCG tablet Take 50 mcg by mouth daily.  Physical Exam: BP 117/78  Pulse 96  Resp 20  Ht 5\' 7"  (1.702 m)  Wt 112 lb 9.6 oz (51.075 kg)  BMI 17.64 kg/m2  SpO2 96% She is a thin, chronically ill-appearing woman who does not appear to be in any distress. Lung exam is clear. Cardiac exam shows a regular rate and rhythm with normal heart sounds.  Diagnostic Tests:  *RADIOLOGY REPORT*   Clinical Data: Lung mass.  Pain.  Hemoptysis.   CT CHEST WITHOUT CONTRAST   Technique:  Multidetector CT imaging of the chest was performed following the standard protocol without IV contrast.   Comparison: 08/12/2012.  PET 03/19/2012.  CT chest 01/04/2012.   Findings: No pathologically enlarged mediastinal or axillary lymph nodes.  Hilar regions are difficult to definitively evaluate without IV contrast.  Heart size normal.  No pericardial effusion.   Bullous  emphysema.  Cavitary lesion at the apex of the right hemithorax measures approximately 5.1 x 5.2 cm (previously 6.7 x 5.9 cm).  The walls appear slightly less thickened in some areas. Previously seen areas of airspace consolidation along the inferior aspect of the lesion, on the previous study, have decreased in size and/or become cavitary in the interval (example image 13).  No pleural fluid.  Airway is unremarkable.   Incidental imaging of the upper abdomen shows low attenuation lesions in the liver measuring up to 11 mm, as before.  No worrisome lytic or sclerotic lesions.   IMPRESSION:   Slight interval decrease in size and wall thickness involving a dominant cavitary mass at the apex of the right hemithorax. Improving adjacent areas of airspace consolidation in the right upper lobe, with some associated cavitation. Findings are indicative of an infectious process.     Original Report Authenticated By: Reyes Ivan, M.D.   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 The fact that this is not progressively getting larger makes is more likely to be a chronic lung abscess and not a cavitary lung cancer. I suspect the new onset hemoptysis and purulent sputum is related to an acute exacerbation of the chronic infection. This is also likely responsible for her increased pain. I suspect this lung abscess is probably related to chronic reflux with aspiration. The only treatment option to resolve this problem is right upper lobectomy. This is indicated to eradicate the destroyed lung tissue that is chronically infected and to prevent recurrent episodes of infection and spread to other areas of the lungs. It is also indicated to prevent massive hemoptysis. She continues to refuse surgery at this time but is agreeable to proceed with surgery in 2 months when her FMLA  becomes effective. She understands the risk of delaying surgery including worsening infection, destruction of more lung tissue, and massive life-threatening hemoptysis. I discussed the importance of complete smoking cessation and good nutrition in decreasing her operative risk.  Plan:  She will complete her current antibiotic course and will call my office to schedule right thoracotomy and right upper lobectomy in approximately 2 months. I asked her to contact my office immediately if she develops any worsening hemoptysis. I will plan to see her back in the office prior to surgery to see how she is doing and to further discuss the operative procedure and answer questions.

## 2012-09-24 ENCOUNTER — Encounter: Payer: Self-pay | Admitting: Internal Medicine

## 2012-09-24 ENCOUNTER — Ambulatory Visit (INDEPENDENT_AMBULATORY_CARE_PROVIDER_SITE_OTHER): Payer: 59 | Admitting: Internal Medicine

## 2012-09-24 VITALS — BP 116/70 | HR 98 | Temp 98.1°F | Ht 66.0 in | Wt 113.2 lb

## 2012-09-24 DIAGNOSIS — R319 Hematuria, unspecified: Secondary | ICD-10-CM

## 2012-09-24 DIAGNOSIS — Z0000 Encounter for general adult medical examination without abnormal findings: Secondary | ICD-10-CM

## 2012-09-24 DIAGNOSIS — J852 Abscess of lung without pneumonia: Secondary | ICD-10-CM

## 2012-09-24 DIAGNOSIS — Z23 Encounter for immunization: Secondary | ICD-10-CM

## 2012-09-24 MED ORDER — AZITHROMYCIN 250 MG PO TABS: ORAL_TABLET | ORAL | Status: DC

## 2012-09-24 NOTE — Assessment & Plan Note (Addendum)
Td 05 pneumonia shot 07, 2013 Flu shot today Cscope 4-09 , 1 polyp (@ [facility]) PAP-- Nl 08-2009 MMG--  Nl   08-2009  rec to see  Dr Jennette Kettle  [facility])

## 2012-09-24 NOTE — Assessment & Plan Note (Addendum)
Saw cardiovascular surgery, they recommend surgery by November. At this point, the patient is seeking  a second opinion from Dr.D'Amico @ [facility] In the meantime, she continue with to some degree of sputum production, green, no hemoptysis. The color is more deeper in the last 24 hours Plan: Provide an antibiotic(zpack) See the Dr. at [facility]   ER if symptoms severe, fever, chills or hemoptysis.

## 2012-09-24 NOTE — Patient Instructions (Addendum)
Take the antibiotic as per prescribed, don't take the  antibiotic along with quinine. ER if cough is  severe, you see blood in the sputum. Next visit 6 months

## 2012-09-24 NOTE — Progress Notes (Signed)
  Subjective:    Patient ID: [name] [name], female    DOB: 11-Aug-1956, 56 y.o.   MRN: [id number]  HPI CPX  Past Medical History:   Right upper lobe of cavitary lesion Asthma,adult onset   COPD, per CT 11/10/05, with minimal bronchiectasis rll   GERD   Osteoarthritis   Osteoporosis--Dexa per gyn   Endometriosis   Recurrent isch colitis   Tobacco abuse   Headaches   LICHEN PLANUS   Hx of B12 DEFICIENCY   Cataracts    Past Surgical History:   Knee surgeries x 8   ovarian cyst removal   Bronchoscopy 02-15-12 mediastinoscopy 05/2012  Social History:   Single, no children   mom   live in her house   Works at [employer]   Current smoker since age 39. Still smokes 1 ppd Drinks wine daily  Diet-- regular Exercise-- active   Family history Colon cancer --no Breast cancer --no Diabetes-- mother, grandmother CAD-- mother Sarcoma-- father aneurysm-- M and GF  Review of Systems No fever or chills Weight is stable No nausea, vomiting, diarrhea. No dysuria gross hematuria No anxiety depression Still coughing, had antibiotics a few weeks ago, amount of sputum decreased but now is coming back, yesterday the color was darker. No hemoptysis.    Objective:   Physical Exam  General -- alert, well-developed, and slt underweight appearing. No apparent distress.  Neck --no thyromegaly Lungs -- normal respiratory effort, no intercostal retractions, no accessory muscle use, and  slt decreased breath sounds.   Heart-- normal rate, regular rhythm, no murmur, and no gallop.   Abdomen--soft, non-tender, no distention, no masses, no HSM, no guarding, and no rigidity.   Extremities-- no pretibial edema bilaterally  Neurologic-- alert & oriented X3 and strength normal in all extremities. Psych-- Cognition and judgment appear intact. Alert and cooperative with normal attention span and concentration.  not anxious appearing and not depressed appearing.      Assessment & Plan:

## 2012-09-25 LAB — LIPID PANEL
HDL: 65.7 mg/dL (ref >39.00)
Triglycerides: 57 mg/dL (ref 0.0–149.0)
VLDL: 11.4 mg/dL (ref 0.0–40.0)

## 2012-09-25 LAB — POCT URINALYSIS DIPSTICK
Bilirubin, UA: NEGATIVE
Ketones, UA: NEGATIVE
Leukocytes, UA: NEGATIVE
Protein, UA: NEGATIVE
Spec Grav, UA: <=1.005

## 2012-09-25 NOTE — Addendum Note (Signed)
Addended by: Silvio Pate D on: 09/25/2012 05:07 PM   Modules accepted: Orders

## 2012-09-27 LAB — URINE CULTURE
Colony Count: NO GROWTH
Organism ID, Bacteria: NO GROWTH

## 2012-10-04 LAB — VITAMIN D 1,25 DIHYDROXY: Vitamin D 1, 25 (OH)2 Total: 46 pg/mL (ref 18–72)

## 2012-10-05 ENCOUNTER — Telehealth: Payer: Self-pay

## 2012-10-05 ENCOUNTER — Encounter: Payer: Self-pay | Admitting: *Deleted

## 2012-10-05 NOTE — Telephone Encounter (Signed)
Pt states went to [facility] yesterday concerning mass in lung. Pt has to quit smoking due to surgery in 4 weeks. Pt spoke to quit smoking coach and they advised Zyban. Pt is asking will you approve and send to current pharmacy. Plz advise  MW

## 2012-10-05 NOTE — Telephone Encounter (Signed)
Call Wellbutrin  100 mg #60,2 RF Take one tablet a day for one week, then one tablet twice a day. Okay to use the gum and a nicotine patch as needed. Quit tobacco 2 weeks into Wellbutrin She has counseling available since she works for [employer]. Rec  to reach for help

## 2012-10-08 ENCOUNTER — Other Ambulatory Visit: Payer: Self-pay

## 2012-10-08 NOTE — Telephone Encounter (Signed)
Left msg for pt to return call.

## 2012-10-08 NOTE — Telephone Encounter (Signed)
Spoke to pharmacy and gave verbal order for Rx.. Then I called pt and spoke to pt mom to advise:  Willow Ora, MD 10/05/2012 5:13 PM Signed  Call Wellbutrin 100 mg #60,2 RF  Take one tablet a day for one week, then one tablet twice a day.  Okay to use the gum and a nicotine patch as needed.  Quit tobacco 2 weeks into Wellbutrin  She has counseling available since she works for [employer]. Rec to reach for help  Pt mom stated understanding and would relay to daughter. Advised pt mom where Rx was.        MW

## 2012-10-10 NOTE — Telephone Encounter (Signed)
lmovm for pt to return call.  

## 2012-10-11 NOTE — Telephone Encounter (Signed)
Spoke with pt. She states that she picked up the rx for wellbutrin & took one pill & remembered that she was unable to take the med. Pt states she knows there is nothing else she can take & she has an appt with another Dr. Next week.

## 2012-10-16 ENCOUNTER — Ambulatory Visit (INDEPENDENT_AMBULATORY_CARE_PROVIDER_SITE_OTHER): Payer: 59 | Admitting: Surgery

## 2012-10-16 ENCOUNTER — Encounter: Payer: Self-pay | Admitting: Surgery

## 2012-10-16 DIAGNOSIS — R222 Localized swelling, mass and lump, trunk: Secondary | ICD-10-CM

## 2012-10-16 DIAGNOSIS — R918 Other nonspecific abnormal finding of lung field: Secondary | ICD-10-CM

## 2012-10-16 DIAGNOSIS — J852 Abscess of lung without pneumonia: Secondary | ICD-10-CM

## 2012-10-16 NOTE — Progress Notes (Signed)
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 She went to [facility] for a second opinion from Dr. Ewing Schlein and was told that she needed a right upper lobectomy as I told her before. She has decided she would like to have the surgery at [facility] by me. She said that she recently had an exacerbation of her lung abscess and had to be treated with antibiotics. She currently denies any fever or chills. She still coughing up some purulent-appearing sputum but has noted no hemoptysis. She said that she has cut back on her smoking and is now smoking about 7 cigarettes per day.  Current Outpatient Prescriptions  Medication Sig Dispense Refill  . ALPRAZolam (XANAX) 0.5 MG tablet Take 0.25-0.5 mg by mouth at bedtime as needed. as needed for insomnia.      . calcium carbonate (TUMS - DOSED IN MG ELEMENTAL CALCIUM) 500 MG chewable tablet Chew 1 tablet by mouth daily.        . GuaiFENesin (MUCINEX PO) Take by mouth daily.      Marland Kitchen HYDROcodone-acetaminophen (LORCET) 10-650 MG per tablet TAKE 1 TABLET BY MOUTH EVERY 8 HOURS AS NEEDED FOR PAIN  60 tablet  3  . nicotine (NICOTROL) 10 MG inhaler Inhale 1 puff into the lungs as needed.      . nicotine polacrilex (NICORETTE) 4 MG lozenge Place 4 mg inside cheek as needed.        . NONFORMULARY OR COMPOUNDED ITEM Phytomega multivitamin once daily      . omeprazole (PRILOSEC) 20 MG capsule Take 20 mg by mouth daily.       . quiNINE (QUALAQUIN) 324 MG capsule Take 648 mg by mouth daily as needed. For foot cramps      . vitamin B-12 (CYANOCOBALAMIN) 100 MCG tablet Take 50 mcg by mouth daily.           Physical Exam: BP 115/73  Pulse 90  Resp 20  Ht 5\' 7"  (1.702 m)  Wt 111 lb (50.349 kg)  BMI 17.38 kg/m2  SpO2 99% She looks comfortable. She does not look like she lost any more weight. Lung exam is  clear. Cardiac exam shows regular rate and rhythm with normal heart sounds.  Diagnostic Tests:  Primary function testing done on 10/04/2012 at [facility] showed an FEV1 of 2.17 which was 78% predicted. Her MVV was 85% predicted. Her uncorrected diffusion capacity was 62% predicted.  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 She has had recurrent exacerbations with foul-smelling, purulent sputum requiring antibiotics and has also had some hemoptysis. The treatment for this is right upper lobectomy and possibly superior segmentectomy. I think her PFT's are acceptable for right upper lobectomy and superior segmentectomy if needed. I don't think the right upper lobe is doing much at all so removal may not affect her pulmonary function. I told her that I would expect significant adhesions due to the chronic infection and inflammation and that I did not think this would be suitable for a thoracoscopic approach. I  discussed the operative procedure with her including alternatives, benefits, and risks including but not limited to bleeding, blood transfusion, infection, bronchial stump complications with bronchopleural fistula, respiratory failure, and residual space problems. She had a long list of questions and I  answered all of them to her satisfaction.  Plan: She will call our office to schedule flexible bronchoscopy and right thoracotomy for right upper lobectomy in the near future.

## 2012-10-17 ENCOUNTER — Other Ambulatory Visit: Payer: Self-pay | Admitting: *Deleted

## 2012-10-17 DIAGNOSIS — R911 Solitary pulmonary nodule: Secondary | ICD-10-CM

## 2012-10-18 ENCOUNTER — Other Ambulatory Visit: Payer: Self-pay | Admitting: Internal Medicine

## 2012-10-18 NOTE — Telephone Encounter (Signed)
done

## 2012-10-18 NOTE — Telephone Encounter (Signed)
Ok to refill 

## 2012-10-31 DIAGNOSIS — Z0271 Encounter for disability determination: Secondary | ICD-10-CM

## 2012-11-02 ENCOUNTER — Encounter (HOSPITAL_COMMUNITY)
Admission: RE | Admit: 2012-11-02 | Discharge: 2012-11-02 | Disposition: A | Discharge status: Home / Self Care | Payer: 59 | Source: Ambulatory Visit | Attending: Surgery | Admitting: Surgery

## 2012-11-02 ENCOUNTER — Encounter (HOSPITAL_COMMUNITY): Payer: Self-pay

## 2012-11-02 VITALS — BP 117/76 | HR 104 | Temp 97.8°F | Resp 18 | Ht 66.0 in | Wt 113.6 lb

## 2012-11-02 DIAGNOSIS — R911 Solitary pulmonary nodule: Secondary | ICD-10-CM

## 2012-11-02 HISTORY — DX: Spontaneous tension pneumothorax: J93.0

## 2012-11-02 HISTORY — DX: Unspecified hemorrhoids: K64.9

## 2012-11-02 LAB — COMPREHENSIVE METABOLIC PANEL
ALT: 17 U/L (ref 0–35)
AST: 17 U/L (ref 0–37)
Albumin: 4.1 g/dL (ref 3.5–5.2)
CO2: 27 mEq/L (ref 19–32)
Calcium: 9.8 mg/dL (ref 8.4–10.5)
Creatinine, Ser: 0.67 mg/dL (ref 0.50–1.10)
GFR calc non Af Amer: >90 mL/min (ref >90)
Sodium: 139 mEq/L (ref 135–145)
Total Protein: 7.7 g/dL (ref 6.0–8.3)

## 2012-11-02 LAB — CBC
MCH: 32.7 pg (ref 26.0–34.0)
MCHC: 32.5 g/dL (ref 30.0–36.0)
MCV: 100.5 fL — ABNORMAL HIGH (ref 78.0–100.0)
Platelets: 491 10*3/uL — ABNORMAL HIGH (ref 150–400)
RDW: 12.3 % (ref 11.5–15.5)

## 2012-11-02 LAB — URINALYSIS, ROUTINE W REFLEX MICROSCOPIC
Glucose, UA: NEGATIVE mg/dL
Ketones, ur: NEGATIVE mg/dL
Leukocytes, UA: NEGATIVE
Protein, ur: NEGATIVE mg/dL
Urobilinogen, UA: 0.2 mg/dL (ref 0.0–1.0)

## 2012-11-02 LAB — TYPE AND SCREEN
ABO/RH(D): A POS
Antibody Screen: NEGATIVE

## 2012-11-02 LAB — PROTIME-INR: INR: 1.01 (ref 0.00–1.49)

## 2012-11-02 LAB — SURGICAL PCR SCREEN
MRSA, PCR: NEGATIVE
Staphylococcus aureus: NEGATIVE

## 2012-11-02 LAB — URINE MICROSCOPIC-ADD ON

## 2012-11-02 NOTE — Pre-Procedure Instructions (Addendum)
20 TIERRE GERARD  11/02/2012   Your procedure is scheduled on:  Monday, December 2nd.  Report to [facility] at 7:30 AM.  Call this number if you have problems the morning of surgery: [phone]   Remember:      Take these medicines the morning of surgery with A SIP OF WATER: Guaifensin (Mucinex), Omeprazole (Prilosec).  May take Hydrocodone- Acetaminophen (Lorcet) if needed.   Do not wear jewelry, make-up or nail polish.  Do not wear lotions, powders, or perfumes. You may wear deodorant.  Do not shave 48 hours prior to surgery. Men may shave face and neck.  Do not bring valuables to the hospital.  Contacts, dentures or bridgework may not be worn into surgery.  Leave suitcase in the car. After surgery it may be brought to your room.  For patients admitted to the hospital, checkout time is 11:00 AM the day of discharge.   Patients discharged the day of surgery will not be allowed to drive home.  Name and phone number of your driver: NA  Special Instructions: Shower using CHG 2 nights before surgery and the night before surgery.  If you shower the day of surgery use CHG.  Use special wash - you have one bottle of CHG for all showers.  You should use approximately 1/3 of the bottle for each shower.   Please read over the following fact sheets that you were given: Pain Booklet, Coughing and Deep Breathing, Blood Transfusion Information and Surgical Site Infection Prevention

## 2012-11-04 MED ORDER — DEXTROSE 5 % IV SOLN
1.5000 g | INTRAVENOUS | Status: AC
  Administered 2012-11-05: 1.5 g via INTRAVENOUS
  Filled 2012-11-04: qty 1.5

## 2012-11-05 ENCOUNTER — Inpatient Hospital Stay (HOSPITAL_COMMUNITY)
Admission: RE | Admit: 2012-11-05 | Discharge: 2012-11-19 | DRG: 163 | Disposition: A | Discharge status: Home / Self Care | Payer: 59 | Source: Ambulatory Visit | Attending: Surgery | Admitting: Surgery

## 2012-11-05 ENCOUNTER — Ambulatory Visit (HOSPITAL_COMMUNITY): Payer: 59 | Admitting: Certified Registered"

## 2012-11-05 ENCOUNTER — Encounter (HOSPITAL_COMMUNITY): Payer: Self-pay | Admitting: Certified Registered"

## 2012-11-05 ENCOUNTER — Encounter (HOSPITAL_COMMUNITY)
Admission: RE | Disposition: A | Discharge status: Home / Self Care | Payer: Self-pay | Source: Ambulatory Visit | Attending: Surgery

## 2012-11-05 ENCOUNTER — Encounter (HOSPITAL_COMMUNITY): Payer: Self-pay | Admitting: Surgery

## 2012-11-05 ENCOUNTER — Inpatient Hospital Stay (HOSPITAL_COMMUNITY): Payer: 59

## 2012-11-05 DIAGNOSIS — Z23 Encounter for immunization: Secondary | ICD-10-CM

## 2012-11-05 DIAGNOSIS — F17200 Nicotine dependence, unspecified, uncomplicated: Secondary | ICD-10-CM | POA: Diagnosis present

## 2012-11-05 DIAGNOSIS — J852 Abscess of lung without pneumonia: Principal | ICD-10-CM | POA: Diagnosis present

## 2012-11-05 DIAGNOSIS — R911 Solitary pulmonary nodule: Secondary | ICD-10-CM

## 2012-11-05 DIAGNOSIS — J95811 Postprocedural pneumothorax: Secondary | ICD-10-CM | POA: Diagnosis not present

## 2012-11-05 DIAGNOSIS — Z79899 Other long term (current) drug therapy: Secondary | ICD-10-CM

## 2012-11-05 DIAGNOSIS — M1990 Unspecified osteoarthritis, unspecified site: Secondary | ICD-10-CM | POA: Diagnosis present

## 2012-11-05 DIAGNOSIS — J189 Pneumonia, unspecified organism: Secondary | ICD-10-CM

## 2012-11-05 DIAGNOSIS — J988 Other specified respiratory disorders: Secondary | ICD-10-CM | POA: Diagnosis present

## 2012-11-05 DIAGNOSIS — M810 Age-related osteoporosis without current pathological fracture: Secondary | ICD-10-CM | POA: Diagnosis present

## 2012-11-05 DIAGNOSIS — K219 Gastro-esophageal reflux disease without esophagitis: Secondary | ICD-10-CM | POA: Diagnosis present

## 2012-11-05 DIAGNOSIS — Z01818 Encounter for other preprocedural examination: Secondary | ICD-10-CM

## 2012-11-05 DIAGNOSIS — E559 Vitamin D deficiency, unspecified: Secondary | ICD-10-CM | POA: Diagnosis present

## 2012-11-05 DIAGNOSIS — Y836 Removal of other organ (partial) (total) as the cause of abnormal reaction of the patient, or of later complication, without mention of misadventure at the time of the procedure: Secondary | ICD-10-CM | POA: Diagnosis not present

## 2012-11-05 DIAGNOSIS — J4489 Other specified chronic obstructive pulmonary disease: Secondary | ICD-10-CM | POA: Diagnosis present

## 2012-11-05 DIAGNOSIS — J449 Chronic obstructive pulmonary disease, unspecified: Secondary | ICD-10-CM | POA: Diagnosis present

## 2012-11-05 DIAGNOSIS — Z902 Acquired absence of lung [part of]: Secondary | ICD-10-CM

## 2012-11-05 DIAGNOSIS — Z9889 Other specified postprocedural states: Secondary | ICD-10-CM

## 2012-11-05 DIAGNOSIS — D381 Neoplasm of uncertain behavior of trachea, bronchus and lung: Secondary | ICD-10-CM

## 2012-11-05 DIAGNOSIS — B370 Candidal stomatitis: Secondary | ICD-10-CM | POA: Diagnosis not present

## 2012-11-05 HISTORY — PX: LOBECTOMY: SHX5089

## 2012-11-05 HISTORY — PX: THORACOTOMY: SHX5074

## 2012-11-05 HISTORY — PX: FLEXIBLE BRONCHOSCOPY: SHX5094

## 2012-11-05 LAB — BLOOD GAS, ARTERIAL
Bicarbonate: 22.1 mEq/L (ref 20.0–24.0)
Drawn by: 181601
FIO2: 0.21 %
O2 Saturation: 97.7 %
Patient temperature: 98.6
pH, Arterial: 7.466 — ABNORMAL HIGH (ref 7.350–7.450)

## 2012-11-05 LAB — GLUCOSE, CAPILLARY

## 2012-11-05 SURGERY — BRONCHOSCOPY, FLEXIBLE
Anesthesia: General | Site: Chest | Laterality: Right | Wound class: Clean Contaminated

## 2012-11-05 MED ORDER — HYDROMORPHONE HCL PF 1 MG/ML IJ SOLN
INTRAMUSCULAR | Status: AC
  Filled 2012-11-05: qty 1

## 2012-11-05 MED ORDER — BUPIVACAINE 0.5 % ON-Q PUMP SINGLE CATH 400 ML
400.0000 mL | INJECTION | Status: DC
  Filled 2012-11-05: qty 400

## 2012-11-05 MED ORDER — LACTATED RINGERS IV SOLN
INTRAVENOUS | Status: DC | PRN
  Administered 2012-11-05: 07:00:00 via INTRAVENOUS

## 2012-11-05 MED ORDER — FENTANYL 10 MCG/ML IV SOLN
INTRAVENOUS | Status: DC
  Administered 2012-11-05: 225 ug via INTRAVENOUS
  Administered 2012-11-05: 21:00:00 via INTRAVENOUS
  Administered 2012-11-05: 200 ug via INTRAVENOUS
  Administered 2012-11-05: 14:00:00 via INTRAVENOUS
  Administered 2012-11-06: 100 ug via INTRAVENOUS
  Administered 2012-11-06: 135 ug via INTRAVENOUS
  Administered 2012-11-06: 150 ug via INTRAVENOUS
  Administered 2012-11-06: 21:00:00 via INTRAVENOUS
  Administered 2012-11-06: 210 ug via INTRAVENOUS
  Administered 2012-11-06: 195 ug via INTRAVENOUS
  Administered 2012-11-06: 265.5 ug via INTRAVENOUS
  Administered 2012-11-07: 14:00:00 via INTRAVENOUS
  Administered 2012-11-07: 45 ug via INTRAVENOUS
  Administered 2012-11-07: 75 ug via INTRAVENOUS
  Administered 2012-11-07: 150 ug via INTRAVENOUS
  Administered 2012-11-07: 135 ug via INTRAVENOUS
  Administered 2012-11-07: 210 ug via INTRAVENOUS
  Administered 2012-11-07: 175.9 ug via INTRAVENOUS
  Administered 2012-11-08: 150 ug via INTRAVENOUS
  Administered 2012-11-08: 105 ug via INTRAVENOUS
  Administered 2012-11-08: 210 ug via INTRAVENOUS
  Administered 2012-11-08: 163.5 ug via INTRAVENOUS
  Administered 2012-11-08: 210 ug via INTRAVENOUS
  Administered 2012-11-08: 13:00:00 via INTRAVENOUS
  Administered 2012-11-08: 122 ug via INTRAVENOUS
  Administered 2012-11-09: 270 ug via INTRAVENOUS
  Administered 2012-11-09: 105 ug via INTRAVENOUS
  Administered 2012-11-09 (×2): via INTRAVENOUS
  Administered 2012-11-09: 285 ug via INTRAVENOUS
  Administered 2012-11-09: 210 ug via INTRAVENOUS
  Administered 2012-11-09: 193.6 ug via INTRAVENOUS
  Administered 2012-11-10: 75 ug via INTRAVENOUS
  Administered 2012-11-10: 187.5 ug via INTRAVENOUS
  Administered 2012-11-10: 120 ug via INTRAVENOUS
  Administered 2012-11-10: 187.5 ug via INTRAVENOUS
  Administered 2012-11-10: 09:00:00 via INTRAVENOUS
  Administered 2012-11-10: 105 ug via INTRAVENOUS
  Administered 2012-11-10: 180 ug via INTRAVENOUS
  Administered 2012-11-10: 120 ug via INTRAVENOUS
  Administered 2012-11-10: 18:00:00 via INTRAVENOUS
  Administered 2012-11-11: 209.7 ug via INTRAVENOUS
  Administered 2012-11-11: 192.2 ug via INTRAVENOUS
  Administered 2012-11-11: 150 ug via INTRAVENOUS
  Administered 2012-11-11: 195 ug via INTRAVENOUS
  Administered 2012-11-11: 17:00:00 via INTRAVENOUS
  Administered 2012-11-11: 120 ug via INTRAVENOUS
  Administered 2012-11-11: 165.5 ug via INTRAVENOUS
  Administered 2012-11-11: 06:00:00 via INTRAVENOUS
  Administered 2012-11-12: 195 ug via INTRAVENOUS
  Administered 2012-11-12: 04:00:00 via INTRAVENOUS
  Administered 2012-11-12 (×2): 105 ug via INTRAVENOUS
  Filled 2012-11-05 (×16): qty 50

## 2012-11-05 MED ORDER — ALBUMIN HUMAN 5 % IV SOLN
12.5000 g | Freq: Once | INTRAVENOUS | Status: AC
  Administered 2012-11-05: 12.5 g via INTRAVENOUS

## 2012-11-05 MED ORDER — ONDANSETRON HCL 4 MG/2ML IJ SOLN: 4.0000 mg | Freq: Once | INTRAMUSCULAR | Status: DC | PRN

## 2012-11-05 MED ORDER — NEOSTIGMINE METHYLSULFATE 1 MG/ML IJ SOLN
INTRAMUSCULAR | Status: DC | PRN
  Administered 2012-11-05: 3 mg via INTRAVENOUS

## 2012-11-05 MED ORDER — BUPIVACAINE ON-Q PAIN PUMP (FOR ORDER SET NO CHG)
INJECTION | Status: AC
  Filled 2012-11-05: qty 1

## 2012-11-05 MED ORDER — ACETAMINOPHEN 10 MG/ML IV SOLN
1000.0000 mg | Freq: Four times a day (QID) | INTRAVENOUS | Status: AC
  Administered 2012-11-06 (×3): 1000 mg via INTRAVENOUS
  Filled 2012-11-05 (×3): qty 100

## 2012-11-05 MED ORDER — ALBUMIN HUMAN 5 % IV SOLN
INTRAVENOUS | Status: AC
  Administered 2012-11-05: 12.5 g
  Filled 2012-11-05: qty 250

## 2012-11-05 MED ORDER — VANCOMYCIN HCL IN DEXTROSE 1-5 GM/200ML-% IV SOLN
1000.0000 mg | Freq: Two times a day (BID) | INTRAVENOUS | Status: AC
  Administered 2012-11-05: 1000 mg via INTRAVENOUS
  Filled 2012-11-05 (×2): qty 200

## 2012-11-05 MED ORDER — NALOXONE HCL 0.4 MG/ML IJ SOLN: 0.4000 mg | INTRAMUSCULAR | Status: DC | PRN

## 2012-11-05 MED ORDER — ONDANSETRON HCL 4 MG/2ML IJ SOLN
4.0000 mg | Freq: Four times a day (QID) | INTRAMUSCULAR | Status: DC | PRN
  Filled 2012-11-05: qty 2

## 2012-11-05 MED ORDER — MIDAZOLAM HCL 5 MG/5ML IJ SOLN
INTRAMUSCULAR | Status: DC | PRN
  Administered 2012-11-05: 2 mg via INTRAVENOUS

## 2012-11-05 MED ORDER — VECURONIUM BROMIDE 10 MG IV SOLR
INTRAVENOUS | Status: DC | PRN
  Administered 2012-11-05 (×3): 1 mg via INTRAVENOUS
  Administered 2012-11-05: 2 mg via INTRAVENOUS
  Administered 2012-11-05 (×3): 1 mg via INTRAVENOUS
  Administered 2012-11-05: 6 mg via INTRAVENOUS
  Administered 2012-11-05: 1 mg via INTRAVENOUS

## 2012-11-05 MED ORDER — SUFENTANIL CITRATE 50 MCG/ML IV SOLN
INTRAVENOUS | Status: DC | PRN
  Administered 2012-11-05: 10 ug via INTRAVENOUS
  Administered 2012-11-05: 20 ug via INTRAVENOUS
  Administered 2012-11-05 (×7): 10 ug via INTRAVENOUS

## 2012-11-05 MED ORDER — PROPOFOL 10 MG/ML IV BOLUS
INTRAVENOUS | Status: DC | PRN
  Administered 2012-11-05: 125 mg via INTRAVENOUS

## 2012-11-05 MED ORDER — GLYCOPYRROLATE 0.2 MG/ML IJ SOLN
INTRAMUSCULAR | Status: DC | PRN
  Administered 2012-11-05: .5 mg via INTRAVENOUS

## 2012-11-05 MED ORDER — LACTATED RINGERS IV SOLN
INTRAVENOUS | Status: DC | PRN
  Administered 2012-11-05 (×3): via INTRAVENOUS

## 2012-11-05 MED ORDER — PHENYLEPHRINE HCL 10 MG/ML IJ SOLN
10.0000 mg | INTRAMUSCULAR | Status: DC | PRN
  Administered 2012-11-05: 40 ug/min via INTRAVENOUS

## 2012-11-05 MED ORDER — BISACODYL 5 MG PO TBEC
10.0000 mg | DELAYED_RELEASE_TABLET | Freq: Every day | ORAL | Status: DC
  Administered 2012-11-06 – 2012-11-19 (×12): 10 mg via ORAL
  Filled 2012-11-05 (×13): qty 2

## 2012-11-05 MED ORDER — SENNOSIDES-DOCUSATE SODIUM 8.6-50 MG PO TABS
1.0000 | ORAL_TABLET | Freq: Every evening | ORAL | Status: DC | PRN
  Administered 2012-11-14: 1 via ORAL
  Filled 2012-11-05 (×2): qty 1

## 2012-11-05 MED ORDER — BUPIVACAINE HCL (PF) 0.5 % IJ SOLN
INTRAMUSCULAR | Status: DC | PRN
  Administered 2012-11-05: 30 mL

## 2012-11-05 MED ORDER — 0.9 % SODIUM CHLORIDE (POUR BTL) OPTIME
TOPICAL | Status: DC | PRN
  Administered 2012-11-05: 1000 mL

## 2012-11-05 MED ORDER — ONDANSETRON HCL 4 MG/2ML IJ SOLN
4.0000 mg | Freq: Four times a day (QID) | INTRAMUSCULAR | Status: DC | PRN
  Administered 2012-11-05 – 2012-11-09 (×3): 4 mg via INTRAVENOUS
  Filled 2012-11-05 (×2): qty 2

## 2012-11-05 MED ORDER — DEXTROSE-NACL 5-0.45 % IV SOLN
INTRAVENOUS | Status: DC
  Administered 2012-11-05: 100 mL via INTRAVENOUS
  Administered 2012-11-08: 20 mL/h via INTRAVENOUS
  Filled 2012-11-05: qty 1000

## 2012-11-05 MED ORDER — LEVALBUTEROL HCL 0.63 MG/3ML IN NEBU
0.6300 mg | INHALATION_SOLUTION | Freq: Four times a day (QID) | RESPIRATORY_TRACT | Status: DC
  Administered 2012-11-05 – 2012-11-09 (×14): 0.63 mg via RESPIRATORY_TRACT
  Filled 2012-11-05 (×20): qty 3

## 2012-11-05 MED ORDER — ALBUMIN HUMAN 5 % IV SOLN
INTRAVENOUS | Status: AC
  Administered 2012-11-05: 14:00:00
  Filled 2012-11-05: qty 250

## 2012-11-05 MED ORDER — INSULIN ASPART 100 UNIT/ML ~~LOC~~ SOLN
0.0000 [IU] | SUBCUTANEOUS | Status: DC
  Administered 2012-11-05: 2 [IU] via SUBCUTANEOUS

## 2012-11-05 MED ORDER — DEXTROSE 5 % IV SOLN
1.5000 g | Freq: Two times a day (BID) | INTRAVENOUS | Status: AC
  Administered 2012-11-05 – 2012-11-06 (×2): 1.5 g via INTRAVENOUS
  Filled 2012-11-05 (×3): qty 1.5

## 2012-11-05 MED ORDER — BUPIVACAINE 0.5 % ON-Q PUMP SINGLE CATH 400 ML
INJECTION | Status: DC | PRN
  Administered 2012-11-05: 400 mL

## 2012-11-05 MED ORDER — HYDROMORPHONE HCL PF 1 MG/ML IJ SOLN
0.2500 mg | INTRAMUSCULAR | Status: DC | PRN
  Administered 2012-11-05 (×3): 0.5 mg via INTRAVENOUS

## 2012-11-05 MED ORDER — OXYCODONE HCL 5 MG PO TABS
5.0000 mg | ORAL_TABLET | ORAL | Status: AC | PRN
Start: 2012-11-05 — End: 2012-11-06
  Administered 2012-11-05 – 2012-11-06 (×3): 10 mg via ORAL
  Filled 2012-11-05 (×4): qty 2

## 2012-11-05 MED ORDER — PANTOPRAZOLE SODIUM 40 MG PO TBEC
40.0000 mg | DELAYED_RELEASE_TABLET | Freq: Two times a day (BID) | ORAL | Status: DC
  Administered 2012-11-05 – 2012-11-08 (×7): 40 mg via ORAL
  Filled 2012-11-05 (×7): qty 1

## 2012-11-05 MED ORDER — SODIUM CHLORIDE 0.9 % IJ SOLN
9.0000 mL | INTRAMUSCULAR | Status: DC | PRN
  Administered 2012-11-11: 20 mL via INTRAVENOUS

## 2012-11-05 MED ORDER — PHENYLEPHRINE HCL 10 MG/ML IJ SOLN
INTRAMUSCULAR | Status: DC | PRN
  Administered 2012-11-05 (×2): 40 ug via INTRAVENOUS
  Administered 2012-11-05: 80 ug via INTRAVENOUS

## 2012-11-05 MED ORDER — HEMOSTATIC AGENTS (NO CHARGE) OPTIME
TOPICAL | Status: DC | PRN
  Administered 2012-11-05: 1 via TOPICAL

## 2012-11-05 MED ORDER — TRAMADOL HCL 50 MG PO TABS
50.0000 mg | ORAL_TABLET | Freq: Four times a day (QID) | ORAL | Status: DC | PRN
  Administered 2012-11-06: 50 mg via ORAL
  Administered 2012-11-11 – 2012-11-18 (×6): 100 mg via ORAL
  Filled 2012-11-05: qty 2
  Filled 2012-11-05: qty 1
  Filled 2012-11-05 (×5): qty 2

## 2012-11-05 MED ORDER — DEXMEDETOMIDINE HCL IN NACL 200 MCG/50ML IV SOLN
0.4000 ug/kg/h | INTRAVENOUS | Status: DC
  Filled 2012-11-05: qty 50

## 2012-11-05 MED ORDER — OXYCODONE-ACETAMINOPHEN 5-325 MG PO TABS
1.0000 | ORAL_TABLET | ORAL | Status: DC | PRN
  Administered 2012-11-06 – 2012-11-10 (×16): 2 via ORAL
  Administered 2012-11-10: 1 via ORAL
  Administered 2012-11-10: 2 via ORAL
  Administered 2012-11-10: 1 via ORAL
  Administered 2012-11-10 – 2012-11-12 (×7): 2 via ORAL
  Filled 2012-11-05 (×4): qty 2
  Filled 2012-11-05: qty 1
  Filled 2012-11-05 (×20): qty 2
  Filled 2012-11-05: qty 1

## 2012-11-05 MED ORDER — ARTIFICIAL TEARS OP OINT
TOPICAL_OINTMENT | OPHTHALMIC | Status: DC | PRN
  Administered 2012-11-05: 1 via OPHTHALMIC

## 2012-11-05 MED ORDER — DEXMEDETOMIDINE BOLUS VIA INFUSION: 1.0000 ug/kg | Freq: Once | INTRAVENOUS | Status: DC

## 2012-11-05 MED ORDER — POTASSIUM CHLORIDE 10 MEQ/50ML IV SOLN
10.0000 meq | Freq: Every day | INTRAVENOUS | Status: DC | PRN
  Administered 2012-11-06 – 2012-11-07 (×6): 10 meq via INTRAVENOUS
  Filled 2012-11-05: qty 100
  Filled 2012-11-05 (×2): qty 50
  Filled 2012-11-05: qty 100
  Filled 2012-11-05 (×6): qty 50

## 2012-11-05 MED ORDER — ONDANSETRON HCL 4 MG/2ML IJ SOLN
INTRAMUSCULAR | Status: DC | PRN
  Administered 2012-11-05: 4 mg via INTRAVENOUS

## 2012-11-05 MED ORDER — ACETAMINOPHEN 10 MG/ML IV SOLN
1000.0000 mg | Freq: Once | INTRAVENOUS | Status: AC | PRN
  Administered 2012-11-05: 1000 mg via INTRAVENOUS
  Filled 2012-11-05: qty 100

## 2012-11-05 MED ORDER — ALBUMIN HUMAN 5 % IV SOLN
INTRAVENOUS | Status: AC
  Administered 2012-11-05: 13:00:00
  Filled 2012-11-05: qty 250

## 2012-11-05 MED ORDER — POLYETHYLENE GLYCOL 3350 17 G PO PACK
17.0000 g | PACK | Freq: Every day | ORAL | Status: DC
  Administered 2012-11-07 – 2012-11-19 (×12): 17 g via ORAL
  Filled 2012-11-05 (×14): qty 1

## 2012-11-05 SURGICAL SUPPLY — 88 items
BLADE SURG 11 STRL SS (BLADE) ×3 IMPLANT
BRUSH CYTOL CELLEBRITY 1.5X140 (MISCELLANEOUS) IMPLANT
CANISTER SUCTION 2500CC (MISCELLANEOUS) ×6 IMPLANT
CATH KIT ON Q 5IN SLV (PAIN MANAGEMENT) ×3 IMPLANT
CATH THORACIC 28FR (CATHETERS) ×3 IMPLANT
CATH THORACIC 36FR (CATHETERS) IMPLANT
CATH THORACIC 36FR RT ANG (CATHETERS) IMPLANT
CLIP TI MEDIUM 24 (CLIP) ×3 IMPLANT
CLIP TI WIDE RED SMALL 24 (CLIP) ×3 IMPLANT
CLOTH BEACON ORANGE TIMEOUT ST (SAFETY) ×3 IMPLANT
CONN ST 1/4X3/8  BEN (MISCELLANEOUS) ×1
CONN ST 1/4X3/8 BEN (MISCELLANEOUS) ×2 IMPLANT
CONN Y 3/8X3/8X3/8  BEN (MISCELLANEOUS)
CONN Y 3/8X3/8X3/8 BEN (MISCELLANEOUS) IMPLANT
CONT SPEC 4OZ CLIKSEAL STRL BL (MISCELLANEOUS) ×6 IMPLANT
COTTONBALL LRG STERILE PKG (GAUZE/BANDAGES/DRESSINGS) IMPLANT
COVER SURGICAL LIGHT HANDLE (MISCELLANEOUS) ×6 IMPLANT
COVER TABLE BACK 60X90 (DRAPES) ×3 IMPLANT
DRAIN CHANNEL 28F RND 3/8 FF (WOUND CARE) IMPLANT
DRAIN CHANNEL 32F RND 10.7 FF (WOUND CARE) ×3 IMPLANT
DRAPE LAPAROSCOPIC ABDOMINAL (DRAPES) ×3 IMPLANT
DRAPE WARM FLUID 44X44 (DRAPE) ×3 IMPLANT
ELECT REM PT RETURN 9FT ADLT (ELECTROSURGICAL) ×3
ELECTRODE REM PT RTRN 9FT ADLT (ELECTROSURGICAL) ×2 IMPLANT
FORCEPS BIOP RJ4 1.8 (CUTTING FORCEPS) IMPLANT
GLOVE BIO SURGEON STRL SZ 6 (GLOVE) ×12 IMPLANT
GLOVE BIOGEL PI IND STRL 6 (GLOVE) ×2 IMPLANT
GLOVE BIOGEL PI INDICATOR 6 (GLOVE) ×1
GLOVE EUDERMIC 7 POWDERFREE (GLOVE) ×6 IMPLANT
GOWN PREVENTION PLUS XLARGE (GOWN DISPOSABLE) ×3 IMPLANT
GOWN STRL NON-REIN LRG LVL3 (GOWN DISPOSABLE) ×12 IMPLANT
HANDLE STAPLE ENDO GIA SHORT (STAPLE) ×1
KIT BASIN OR (CUSTOM PROCEDURE TRAY) ×3 IMPLANT
KIT ROOM TURNOVER OR (KITS) ×3 IMPLANT
MARKER SKIN DUAL TIP RULER LAB (MISCELLANEOUS) ×3 IMPLANT
NEEDLE 22X1 1/2 (OR ONLY) (NEEDLE) IMPLANT
NEEDLE BIOPSY TRANSBRONCH 21G (NEEDLE) IMPLANT
NS IRRIG 1000ML POUR BTL (IV SOLUTION) ×6 IMPLANT
OIL SILICONE PENTAX (PARTS (SERVICE/REPAIRS)) ×3 IMPLANT
PACK CHEST (CUSTOM PROCEDURE TRAY) ×3 IMPLANT
PAD ARMBOARD 7.5X6 YLW CONV (MISCELLANEOUS) ×6 IMPLANT
PAIN PUMP ON-Q 400MLX5ML 5IN (MISCELLANEOUS) ×3 IMPLANT
RELOAD EGIA 45 MED/THCK PURPLE (STAPLE) ×6 IMPLANT
RELOAD EGIA TRIS TAN 45 CVD (STAPLE) ×18 IMPLANT
RELOAD TRI 2.0 60 XTHK VAS SUL (STAPLE) ×18 IMPLANT
SEALANT PROGEL (MISCELLANEOUS) IMPLANT
SEALANT SURG COSEAL 4ML (VASCULAR PRODUCTS) IMPLANT
SEALANT SURG COSEAL 8ML (VASCULAR PRODUCTS) ×3 IMPLANT
SOLUTION ANTI FOG 6CC (MISCELLANEOUS) ×3 IMPLANT
SPONGE GAUZE 4X4 12PLY (GAUZE/BANDAGES/DRESSINGS) ×3 IMPLANT
SPONGE INTESTINAL PEANUT (DISPOSABLE) ×3 IMPLANT
STAPLER ENDO GIA 12MM SHORT (STAPLE) ×2 IMPLANT
STAPLER TA30 4.8 NON-ABS (STAPLE) ×6 IMPLANT
SUT PROLENE 3 0 SH DA (SUTURE) IMPLANT
SUT SILK  1 MH (SUTURE) ×2
SUT SILK 1 MH (SUTURE) ×4 IMPLANT
SUT SILK 2 0 SH (SUTURE) ×3 IMPLANT
SUT SILK 2 0SH CR/8 30 (SUTURE) IMPLANT
SUT SILK 3 0SH CR/8 30 (SUTURE) IMPLANT
SUT VIC AB 1 CTX 36 (SUTURE) ×1
SUT VIC AB 1 CTX36XBRD ANBCTR (SUTURE) ×2 IMPLANT
SUT VIC AB 2-0 CT1 27 (SUTURE)
SUT VIC AB 2-0 CT1 TAPERPNT 27 (SUTURE) IMPLANT
SUT VIC AB 2-0 CTX 36 (SUTURE) ×3 IMPLANT
SUT VIC AB 2-0 UR6 27 (SUTURE) IMPLANT
SUT VIC AB 3-0 MH 27 (SUTURE) IMPLANT
SUT VIC AB 3-0 SH 27 (SUTURE) ×1
SUT VIC AB 3-0 SH 27X BRD (SUTURE) ×2 IMPLANT
SUT VIC AB 3-0 X1 27 (SUTURE) ×3 IMPLANT
SUT VICRYL 2 TP 1 (SUTURE) ×6 IMPLANT
SYR 20ML ECCENTRIC (SYRINGE) ×3 IMPLANT
SYR 5ML LUER SLIP (SYRINGE) ×3 IMPLANT
SYR CONTROL 10ML LL (SYRINGE) IMPLANT
SYSTEM SAHARA CHEST DRAIN ATS (WOUND CARE) ×3 IMPLANT
TAPE CLOTH SURG 4X10 WHT LF (GAUZE/BANDAGES/DRESSINGS) ×3 IMPLANT
TIP APPLICATOR SPRAY EXTEND 16 (VASCULAR PRODUCTS) IMPLANT
TOWEL OR 17X24 6PK STRL BLUE (TOWEL DISPOSABLE) ×3 IMPLANT
TOWEL OR 17X26 10 PK STRL BLUE (TOWEL DISPOSABLE) ×3 IMPLANT
TRAP SPECIMEN MUCOUS 40CC (MISCELLANEOUS) ×3 IMPLANT
TRAY FOLEY CATH 14FRSI W/METER (CATHETERS) ×3 IMPLANT
TUBE ANAEROBIC SPECIMEN COL (MISCELLANEOUS) ×3 IMPLANT
TUBE CONNECTING 12X1/4 (SUCTIONS) ×3 IMPLANT
TUNNELER SHEATH ON-Q 11GX8 (MISCELLANEOUS) ×3 IMPLANT
VALVE BIOPSY  SINGLE USE (MISCELLANEOUS)
VALVE BIOPSY SINGLE USE (MISCELLANEOUS) IMPLANT
VALVE SUCTION BRONCHIO DISP (MISCELLANEOUS) IMPLANT
WATER STERILE IRR 1000ML POUR (IV SOLUTION) ×6 IMPLANT
YANKAUER SUCT BULB TIP NO VENT (SUCTIONS) ×3 IMPLANT

## 2012-11-05 NOTE — OR Nursing (Signed)
1st procedure ended @ 08:15am.  2nd procedure started @ 08:41am

## 2012-11-05 NOTE — Anesthesia Postprocedure Evaluation (Signed)
  Anesthesia Post-op Note  Patient: [name] [name]  Procedure(s) Performed: Procedure(s) (LRB) with comments: FLEXIBLE BRONCHOSCOPY (N/A) THORACOTOMY MAJOR (Right) LOBECTOMY (Right) - right upper lobectomy  Patient Location: PACU  Anesthesia Type:General  Level of Consciousness: awake, alert  and oriented  Airway and Oxygen Therapy: Patient Spontanous Breathing and Patient connected to nasal cannula oxygen  Post-op Pain: mild  Post-op Assessment: Post-op Vital signs reviewed, Patient's Cardiovascular Status Stable, Respiratory Function Stable and Pain level controlled  Post-op Vital Signs: stable  Complications: No apparent anesthesia complications

## 2012-11-05 NOTE — Transfer of Care (Signed)
Immediate Anesthesia Transfer of Care Note  Patient: [name] [name]  Procedure(s) Performed: Procedure(s) (LRB) with comments: FLEXIBLE BRONCHOSCOPY (N/A) THORACOTOMY MAJOR (Right) LOBECTOMY (Right) - right upper lobectomy  Patient Location: PACU  Anesthesia Type:General  Level of Consciousness: awake, alert  and oriented  Airway & Oxygen Therapy: Patient Spontanous Breathing and Patient connected to nasal cannula oxygen  Post-op Assessment: Report given to PACU RN, Post -op Vital signs reviewed and stable and Patient moving all extremities  Post vital signs: Reviewed and stable  Complications: No apparent anesthesia complications

## 2012-11-05 NOTE — Interval H&P Note (Signed)
History and Physical Interval Note:  11/05/2012 7:39 AM  [name] [name]  has presented today for surgery, with the diagnosis of lung lesion  The various methods of treatment have been discussed with the patient and family. After consideration of risks, benefits and other options for treatment, the patient has consented to  Procedure(s) (LRB) with comments: FLEXIBLE BRONCHOSCOPY (N/A) THORACOTOMY MAJOR (Right) LOBECTOMY (Right) - right upper lobectomy as a surgical intervention .  The patient's history has been reviewed, patient examined, no change in status, stable for surgery.  I have reviewed the patient's chart and labs.  Questions were answered to the patient's satisfaction.     Alleen Borne

## 2012-11-05 NOTE — Anesthesia Preprocedure Evaluation (Addendum)
Anesthesia Evaluation  Patient identified by MRN, date of birth, ID band Patient awake    Reviewed: Allergy & Precautions, H&P , NPO status , Patient's Chart, lab work & pertinent test results  Airway Mallampati: I TM Distance: >3 FB Neck ROM: Full    Dental  (+) Teeth Intact   Pulmonary asthma , pneumonia -, resolved, COPD breath sounds clear to auscultation        Cardiovascular Rhythm:Regular Rate:Normal     Neuro/Psych  Headaches, PSYCHIATRIC DISORDERS    GI/Hepatic GERD-  ,  Endo/Other    Renal/GU      Musculoskeletal   Abdominal   Peds  Hematology   Anesthesia Other Findings   Reproductive/Obstetrics                          Anesthesia Physical Anesthesia Plan  ASA: III  Anesthesia Plan: General   Post-op Pain Management:    Induction: Intravenous  Airway Management Planned: Double Lumen EBT  Additional Equipment: Arterial line and CVP  Intra-op Plan:   Post-operative Plan: Extubation in OR  Informed Consent: I have reviewed the patients History and Physical, chart, labs and discussed the procedure including the risks, benefits and alternatives for the proposed anesthesia with the patient or authorized representative who has indicated his/her understanding and acceptance.   Dental advisory given  Plan Discussed with: CRNA, Anesthesiologist and Surgeon  Anesthesia Plan Comments:        Anesthesia Quick Evaluation

## 2012-11-05 NOTE — OR Nursing (Signed)
Spoke with dr joslin/MDA re  bp's aline 94/40 ( mean 60) and cuff RA  81/52 ( mean 43) .Marland Kitchen..will give a 3rd albumin and then transfer to 2300

## 2012-11-05 NOTE — Progress Notes (Signed)
S/p RUL and superior segmentectomy  C/o right sided CP  BP 92/53  Pulse 90  Temp 97.7 F (36.5 C) (Oral)  Resp 26  Ht 5\' 6"  (1.676 m)  Wt 113 lb 9 oz (51.512 kg)  BMI 18.33 kg/m2  SpO2 96%  LMP 11/05/2004   Intake/Output Summary (Last 24 hours) at 11/05/12 1724 Last data filed at 11/05/12 1600  Gross per 24 hour  Intake   3602 ml  Output   1080 ml  Net   2522 ml   Doing well early postop

## 2012-11-05 NOTE — H&P (Signed)
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 Since I have been seeing her she has had a few acute exacerbations of infection coughing up yellowish-green, foul-smelling sputum that resolved with antibiotics. She has also had some hemoptysis that has resolved. She had a PET scan previously which showed hypermetabolic uptake within the right upper lobe mass as well as within some right paratracheal lymph nodes. There was some concern about whether this could be a cavitary lung cancer. We did mediastinoscopy with lymph node biopsies and they were all negative for malignancy showing only chronic inflammation. I have recommended right upper lobectomy for removal of the destroyed right upper lobe which has been a source of recurrent infection and hemoptysis. She has refused surgery so far because she has not worked long enough at [employer] to qualify for [employer]. She said that last Thursday she began having new onset of hemoptysis which she described as bright red blood, probably a few tablespoons. About the same time she also noticed reflux of gastric contents up into her throat when lying in bed at night, which she has had in the past. A few days after the hemoptysis started she began having yellowish-green sputum again as well as low-grade fever. She saw Dr. Drue Novel and she was started on Augmentin and an appointment was made to see me. We repeated her CT scan of the chest which shows a slight decrease in the size and wall thickness of the right upper lobe cavitary mass. She said that since starting antibiotics the hemoptysis has improved and her sputum now has a slight orange tinge but there is no bright red blood. Her maximum temperature today was 99. She denies any chills. She denies shortness of breath. She has also noticed increased pain in the  right shoulder and right upper chest since this new hemoptysis started. She continues to smoke and said that some days she smokes more, and some less. She went to [facility] for a second opinion from Dr. Ewing Schlein and was told that she needed a right upper lobectomy as I told her before. She has decided she would like to have the surgery at [facility] by me. She said that she recently had an exacerbation of her lung abscess and had to be treated with antibiotics. She currently denies any fever or chills. She still coughing up some purulent-appearing sputum but has noted no hemoptysis. She said that she has cut back on her smoking and is now smoking about 7 cigarettes per day.     Current Outpatient Prescriptions   Medication  Sig  Dispense  Refill   .  ALPRAZolam (XANAX) 0.5 MG tablet  Take 0.25-0.5 mg by mouth at bedtime as needed. as needed for insomnia.         Marland Kitchen  amoxicillin-clavulanate (AUGMENTIN XR) 1000-62.5 MG per tablet  Take 2 tablets by mouth 2 (two) times daily.   40 tablet   0   .  calcium carbonate (TUMS - DOSED IN MG ELEMENTAL CALCIUM) 500 MG chewable tablet  Chew 1 tablet by mouth daily.           Marland Kitchen  GuaiFENesin (MUCINEX PO)  Take by mouth daily.         Marland Kitchen  HYDROcodone-acetaminophen (LORCET) 10-650 MG per tablet  TAKE 1 TABLET BY MOUTH EVERY 8 HOURS AS NEEDED FOR PAIN   60 tablet   3   .  nicotine polacrilex (NICORETTE) 4 MG lozenge  Place 4 mg inside cheek as needed.           Marland Kitchen  omeprazole (PRILOSEC) 20 MG capsule  Take 20 mg by mouth daily.          .  quiNINE (QUALAQUIN) 324 MG capsule  Take 648 mg by mouth daily as needed. For foot cramps         .  vitamin B-12 (CYANOCOBALAMIN) 100 MCG tablet  Take 50 mcg by mouth daily.              Physical Exam: BP 117/78  Pulse 96  Resp 20  Ht 5\' 7"  (1.702 m)  Wt 112 lb 9.6 oz (51.075 kg)  BMI 17.64 kg/m2  SpO2 96% She is a thin, chronically ill-appearing woman who does not appear to be in any distress. Lung exam is clear. Cardiac exam  shows a regular rate and rhythm with normal heart sounds.  Diagnostic Tests:  *RADIOLOGY REPORT*   Clinical Data: Lung mass.  Pain.  Hemoptysis.   CT CHEST WITHOUT CONTRAST   Technique:  Multidetector CT imaging of the chest was performed following the standard protocol without IV contrast.   Comparison: 08/12/2012.  PET 03/19/2012.  CT chest 01/04/2012.   Findings: No pathologically enlarged mediastinal or axillary lymph nodes.  Hilar regions are difficult to definitively evaluate without IV contrast.  Heart size normal.  No pericardial effusion.   Bullous emphysema.  Cavitary lesion at the apex of the right hemithorax measures approximately 5.1 x 5.2 cm (previously 6.7 x 5.9 cm).  The walls appear slightly less thickened in some areas. Previously seen areas of airspace consolidation along the inferior aspect of the lesion, on the previous study, have decreased in size and/or become cavitary in the interval (example image 13).  No pleural fluid.  Airway is unremarkable.   Incidental imaging of the upper abdomen shows low attenuation lesions in the liver measuring up to 11 mm, as before.  No worrisome lytic or sclerotic lesions.   IMPRESSION:   Slight interval decrease in size and wall thickness involving a dominant cavitary mass at the apex of the right hemithorax. Improving adjacent areas of airspace consolidation in the right upper lobe, with some associated cavitation. Findings are indicative of an infectious process.     Original Report Authenticated By: Reyes Ivan, M.D.   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 She has had recurrent exacerbations with foul-smelling, purulent sputum requiring antibiotics and has also had some hemoptysis. The treatment for this is right upper lobectomy and possibly superior segmentectomy. I  think her PFT's are acceptable for right upper lobectomy and superior segmentectomy if needed. I don't think the right upper lobe is doing much at all so removal may not affect her pulmonary function. I told her that I would expect significant adhesions due to the chronic infection and inflammation and that I did not think this would be suitable for a thoracoscopic approach. I discussed the operative procedure with her including alternatives, benefits, and risks including but not limited  to bleeding, blood transfusion, infection, bronchial stump complications with bronchopleural fistula, respiratory failure, and residual space problems. She had a long list of questions and I  answered all of them to her satisfaction.  Plan: Flexible bronchoscopy, right thoracotomy and right upper lobectomy on 11/05/2012.

## 2012-11-05 NOTE — Preoperative (Signed)
Beta Blockers   Reason not to administer Beta Blockers:Not Applicable 

## 2012-11-05 NOTE — Brief Op Note (Addendum)
11/05/2012  12:54 PM  PATIENT:  Rolan Lipa  56 y.o. female  PRE-OPERATIVE DIAGNOSIS:  lung lesion  POST-OPERATIVE DIAGNOSIS:  lung lesion  PROCEDURE:  Procedure(s) (LRB) with comments: FLEXIBLE BRONCHOSCOPY (N/A) THORACOTOMY MAJOR (Right) LOBECTOMY (Right) - right upper lobectomy and superior segmentectomy  SURGEON:  Surgeon(s) and Role:    * Alleen Borne, MD - Primary  PHYSICIAN ASSISTANT: Coral Ceo, PA-C  ASSISTANTS: none   ANESTHESIA:   none  EBL:  Total I/O In: 2000 [I.V.:2000] Out: 550 [Urine:450; Blood:100]  BLOOD ADMINISTERED:none  DRAINS: 1 28 F Chest Tube(s) in the right anterior pleural space and (1 32 F) Blake drain(s) in the right posterior pleural space   LOCAL MEDICATIONS USED:  NONE  SPECIMEN:  Source of Specimen:  right upper lobe and superior segment right lower lobe  DISPOSITION OF SPECIMEN:  PATHOLOGY  COUNTS:  YES  PLAN OF CARE: Admit to inpatient   PATIENT DISPOSITION:  PACU - hemodynamically stable.   Delay start of Pharmacological VTE agent (>24hrs) due to surgical blood loss or risk of bleeding: yes  Findings:  Right upper lobe and superior segment of lower lobe are a chronic abscess markedly adherent to apex of chest and posterior chest wall.

## 2012-11-06 ENCOUNTER — Encounter (HOSPITAL_COMMUNITY): Payer: Self-pay | Admitting: Surgery

## 2012-11-06 ENCOUNTER — Inpatient Hospital Stay (HOSPITAL_COMMUNITY): Payer: 59

## 2012-11-06 LAB — POCT I-STAT 3, ART BLOOD GAS (G3+)
Bicarbonate: 25 mEq/L — ABNORMAL HIGH (ref 20.0–24.0)
pH, Arterial: 7.395 (ref 7.350–7.450)
pO2, Arterial: 70 mmHg — ABNORMAL LOW (ref 80.0–100.0)

## 2012-11-06 LAB — GLUCOSE, CAPILLARY
Glucose-Capillary: 102 mg/dL — ABNORMAL HIGH (ref 70–99)
Glucose-Capillary: 96 mg/dL (ref 70–99)
Glucose-Capillary: 122 mg/dL — ABNORMAL HIGH (ref 70–99)

## 2012-11-06 LAB — BASIC METABOLIC PANEL
BUN: 4 mg/dL — ABNORMAL LOW (ref 6–23)
Chloride: 100 mEq/L (ref 96–112)
Glucose, Bld: 104 mg/dL — ABNORMAL HIGH (ref 70–99)
Potassium: 3.5 mEq/L (ref 3.5–5.1)

## 2012-11-06 LAB — CBC
HCT: 28.4 % — ABNORMAL LOW (ref 36.0–46.0)
Hemoglobin: 9.7 g/dL — ABNORMAL LOW (ref 12.0–15.0)
RBC: 2.82 MIL/uL — ABNORMAL LOW (ref 3.87–5.11)
WBC: 10.5 10*3/uL (ref 4.0–10.5)

## 2012-11-06 MED ORDER — BOOST / RESOURCE BREEZE PO LIQD
1.0000 | Freq: Three times a day (TID) | ORAL | Status: DC
  Administered 2012-11-06 – 2012-11-19 (×21): 1 via ORAL

## 2012-11-06 MED ORDER — ALPRAZOLAM 0.5 MG PO TABS
0.5000 mg | ORAL_TABLET | Freq: Every evening | ORAL | Status: DC | PRN
  Administered 2012-11-07 – 2012-11-18 (×8): 0.5 mg via ORAL
  Filled 2012-11-06 (×8): qty 1

## 2012-11-06 MED ORDER — NICOTINE 14 MG/24HR TD PT24
14.0000 mg | MEDICATED_PATCH | Freq: Every day | TRANSDERMAL | Status: DC
  Administered 2012-11-06 – 2012-11-10 (×4): 14 mg via TRANSDERMAL
  Filled 2012-11-06 (×8): qty 1

## 2012-11-06 NOTE — Progress Notes (Addendum)
INITIAL ADULT NUTRITION ASSESSMENT Date: 11/06/2012   Time: 10:52 AM   INTERVENTION:  Resource Breeze supplement 3 times daily between meals (250 kcals, 9 gm protein per 8 fl oz carton) RD to follow for nutrition care plan  DOCUMENTATION CODES Per approved criteria  -Non-severe (moderate) malnutrition in the context of chronic illness   Reason for Assessment: Malnutrition Screening Tool Report  ASSESSMENT: Female 56 y.o.  Dx: lung lesion  Hx:  Past Medical History  Diagnosis Date  . COPD (chronic obstructive pulmonary disease)     per CT 11/10/05, will minimal bronchiectasis rll  . GERD (gastroesophageal reflux disease)   . Osteoporosis     dexa per gyn  . 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 Headache   . Lichen planus   . B12 deficiency   . Endometriosis   . Cataracts, bilateral   . Arthritis   . Lung mass   . Asthma     .  years ago- has not had problem for years.  . Pneumonia     last ime 2008  . Hemorrhoid   . Pneumothorax, spontaneous, tension     x 2 in her 20's.  has a chest tube for one    Related Meds:     . acetaminophen  1,000 mg Intravenous Q6H  . [COMPLETED] albumin human  12.5 g Intravenous Once  . [COMPLETED] albumin human  12.5 g Intravenous Once  . [COMPLETED] albumin human      . [COMPLETED] albumin human      . [COMPLETED] albumin human      . bisacodyl  10 mg Oral Daily  . [COMPLETED] cefUROXime (ZINACEF)  IV  1.5 g Intravenous Q12H  . fentaNYL   Intravenous Q4H  . [EXPIRED] HYDROmorphone      . [EXPIRED] HYDROmorphone      . levalbuterol  0.63 mg Nebulization Q6H  . nicotine  14 mg Transdermal Daily  . pantoprazole  40 mg Oral BID AC  . polyethylene glycol  17 g Oral Daily  . [COMPLETED] vancomycin  1,000 mg Intravenous Q12H  . [DISCONTINUED] dexmedetomidine  0.4-1.2 mcg/kg/hr Intravenous To PACU  . [DISCONTINUED] dexmedetomidine  1 mcg/kg Intravenous Once  . [DISCONTINUED] insulin aspart  0-24 Units Subcutaneous Q4H     Ht: 5\' 6"  (167.6 cm)  Wt: 116 lb 6.5 oz (52.8 kg)  Ideal Wt: 61.3 kg % Ideal Wt: 86%  Usual Wt: 142 lb % Usual Wt: 81%  Body mass index is 18.79 kg/(m^2).  Food/Nutrition Related Hx: recent weight lost without trying per admission nutrition screen  Labs:  CMP     Component Value Date/Time   NA 136 11/06/2012 0425   K 3.5 11/06/2012 0425   CL 100 11/06/2012 0425   CO2 25 11/06/2012 0425   GLUCOSE 104* 11/06/2012 0425   BUN 4* 11/06/2012 0425   CREATININE 0.53 11/06/2012 0425   CALCIUM 8.4 11/06/2012 0425   PROT 7.7 11/02/2012 1109   ALBUMIN 4.1 11/02/2012 1109   AST 17 11/02/2012 1109   ALT 17 11/02/2012 1109   ALKPHOS 80 11/02/2012 1109   BILITOT 0.3 11/02/2012 1109   GFRNONAA >90 11/06/2012 0425   GFRAA >90 11/06/2012 0425     Intake/Output Summary (Last 24 hours) at 11/06/12 1053 Last data filed at 11/06/12 0900  Gross per 24 hour  Intake 5156.21 ml  Output   4405 ml  Net 751.21 ml    CBG (last 3)   Basename 11/06/12  4782 11/06/12 0407 11/06/12 0003  GLUCAP 96 102* 101*    Diet Order: Clear Liquid  Supplements/Tube Feeding: N/A  IVF:    bupivacaine ON-Q pain pump   dextrose 5 % and 0.45% NaCl Last Rate: 20 mL/hr at 11/06/12 0400  [DISCONTINUED] bupivacaine 0.5 % ON-Q pump SINGLE CATH 400 mL     Estimated Nutritional Needs:   Kcal: 1500-1700 Protein: 75-85 gm Fluid: 1.5-1.7 L  Patient s/p several procedures 12/2:  FLEXIBLE BRONCHOSCOPY  THORACOTOMY MAJOR (Right)  LOBECTOMY (Right)   Patient states PTA she was only eating 1 large meal (lunch) and had a snack for breakfast & dinner; did not consume dinner; also reports a 26 lb weight loss x 1 year (18%) -- not significant for time frame; she states her lung mass was "eating up my calories".  Patient with visible mild muscle loss to upper body (shoulders, clavicles, & temples); currently on Clear Liquids; PO intake 75% per flowsheet records; patient would benefit from addition of nutrition  supplements -- amenable to [employer] -- RD to order.  Patient meets criteria for non-severe (moderate) malnutrition in the context of chronic illness given < 75% intake of estimated energy requirement for > 1 month, 18% weight loss and mild muscle loss.  NUTRITION DIAGNOSIS: -Increased nutrient needs (NI-5.1).  Status: Ongoing  RELATED TO: post-op healing  AS EVIDENCE BY: estimated nutrition needs  MONITORING/EVALUATION(Goals): Goal: Oral intake with meals & supplements to meet >/= 90% of estimated nutrition needs Monitor: PO & supplemental intake, weight, labs, I/O's  EDUCATION NEEDS: -No education needs identified at this time  Kirkland Hun, RD, LDN Pager #: [phone] After-Hours Pager #: [phone]

## 2012-11-06 NOTE — Progress Notes (Signed)
TCTS  Small air leak with cough Patient examined and record reviewed.Hemodynamics stable,labs satisfactory.Patient had stable day.Continue current care. VAN TRIGT III,PETER 11/06/2012

## 2012-11-06 NOTE — Care Management Note (Signed)
    Page 1 of 2   11/14/2012     9:06:55 AM   CARE MANAGEMENT NOTE 11/14/2012  Patient:  [name] [name], [name] [name]   Account Number:  [id number]  Date Initiated:  11/06/2012  Documentation initiated by:  [facility]  Subjective/Objective Assessment:   Post op thoracotomy.     Action/Plan:   Anticipated DC Date:  11/12/2012   Anticipated DC Plan:  HOME W HOME HEALTH SERVICES      DC Planning Services  CM consult      Choice offered to / List presented to:             Status of service:  In process, will continue to follow Medicare Important Message given?   (If response is "NO", the following Medicare IM given date fields will be blank) Date Medicare IM given:   Date Additional Medicare IM given:    Discharge Disposition:    Per UR Regulation:  Reviewed for med. necessity/level of care/duration of stay  If discussed at Long Length of Stay Meetings, dates discussed:    Comments:  ContactSharen Hint Sister [phone]  11-22-12 9am Avie Arenas, RNBSN [phone] Sister, Tamela Oddi called back.  She is a Runner, broadcasting/film/video in [place] area.  Sister from [place] - is staying longer and depending on when patient is discharged are planning to work it that someone will be in house to take care of mother and patient most of the time.  CM will continue to follow.  11-12-12 2pm Avie Arenas, RNBSN - [phone] BP soft, continues on IV antibiotics and oxygen for pneumonia.  Plan for tx to intermediate today.  Still planning for dc home with [facility] when able.  Called sister and left message for call back.  11-09-12 9:10am Avie Arenas, RNBSN [phone] Post op pneumonia - BP soft.  Still with 2 CT's. productive cough, IV antibiotics.  Refuses to go to SNF and someone will need to be with mother.  Sister from [place] goes home on WED.  Other sister works.  States she knows they are working on something, but does not know their plan.  Will need HH RN on discharge at least - as she progresses  may have other needs.   [facility] follows also.  11-06-12 2:35pm Avie Arenas, RNBSNn [phone] Patient lives with Mother who has dementia.  Sister is here from out of town now and will be here only briefly when pateint is discharged.  Mom has caregiver for 2 hours a day that makes her meals and is active with [facility].  On discharge feels she may need HH and would like [facility].  PCP is Dr. Lovena Neighbours with [facility] and is interested in [facility] program followng her.

## 2012-11-06 NOTE — Progress Notes (Addendum)
1 Day Post-Op Procedure(s) (LRB): FLEXIBLE BRONCHOSCOPY (N/A) THORACOTOMY MAJOR (Right) LOBECTOMY (Right) Subjective: Complains of pain  Objective: Vital signs in last 24 hours: Temp:  [97 F (36.1 C)-99.2 F (37.3 C)] 98.7 F (37.1 C) (12/03 0400) Pulse Rate:  [72-103] 72  (12/03 0700) Cardiac Rhythm:  [-] Normal sinus rhythm (12/03 0600) Resp:  [12-33] 21  (12/03 0700) BP: (72-111)/(40-58) 79/41 mmHg (12/03 0700) SpO2:  [87 %-100 %] 97 % (12/03 0700) Arterial Line BP: (72-156)/(36-68) 108/49 mmHg (12/03 0700) Weight:  [51.512 kg (113 lb 9 oz)-52.8 kg (116 lb 6.5 oz)] 52.8 kg (116 lb 6.5 oz) (12/03 0500)  Hemodynamic parameters for last 24 hours:    Intake/Output from previous day: 12/02 0701 - 12/03 0700 In: 1610.9 [P.O.:960; I.V.:4184.2; IV Piggyback:552] Out: 3855 [Urine:2845; Blood:100; Chest Tube:910] Intake/Output this shift:    General appearance: alert and cooperative Neurologic: intact Heart: regular rate and rhythm, S1, S2 normal, no murmur, click, rub or gallop Lungs: rhonchi bilaterally and mild Extremities: extremities normal, atraumatic, no cyanosis or edema Wound: dressing dry  Lab Results:  [facility] 11/06/12 0425  WBC 10.5  HGB 9.7*  HCT 28.4*  PLT 352   BMET:  Basename 11/06/12 0425  NA 136  K 3.5  CL 100  CO2 25  GLUCOSE 104*  BUN 4*  CREATININE 0.53  CALCIUM 8.4    PT/INR: No results found for this basename: LABPROT,INR in the last 72 hours ABG    Component Value Date/Time   PHART 7.395 11/06/2012 0410   HCO3 25.0* 11/06/2012 0410   TCO2 26 11/06/2012 0410   ACIDBASEDEF 1.2 11/05/2012 0621   O2SAT 94.0 11/06/2012 0410   CBG (last 3)   Basename 11/06/12 0407 11/06/12 0003 11/05/12 1944  GLUCAP 102* 101* 124*    Assessment/Plan: S/P Procedure(s) (LRB): FLEXIBLE BRONCHOSCOPY (N/A) THORACOTOMY MAJOR (Right) LOBECTOMY (Right) Mobilize, IS Continue foley due to patient in ICU and urinary output monitoring Continue chest tubes  but decrease suction to 10 Can't use Toradol due to problems with colitis related to NSAIDs.   LOS: 1 day    Christinamarie Tall K 11/06/2012

## 2012-11-06 NOTE — Op Note (Signed)
[name] [name], [name] [name] NO.:  [id number]  MEDICAL RECORD NO.:  [id number]  LOCATION:  2315                         FACILITY:  [facility]  PHYSICIAN:  Evelene Croon, M.D.     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  OPERATIVE PROCEDURE:  Flexible fiberoptic bronchoscopy, right thoracotomy with right upper lobectomy and lower lobe superior segmentectomy, insertion of On-Q pain pump.  ATTENDING SURGEON:  Evelene Croon, MD  ASSISTANT:  Coral Ceo, PA-C  ANESTHESIA:  General endotracheal.  CLINICAL HISTORY:  This patient is a 56 year old heavy smoker, who I have been following since last spring for a large cavitary right upper lobe lung mass.  She has had recurrent acute exacerbations of infection coughing up yellowish green foul-smelling sputum that have resolved with antibiotics and also has had some hemoptysis that has resolved.  She has had chronic pain in her right upper back and shoulder.  She had a PET scan that showed hypermetabolic uptake within the right upper lobe lung mass as well as within some right paratracheal lymph nodes.  There was some concern about whether this could be a cavitary lung cancer.  She did not want to have surgery at that time for work related issues and did agree to undergo mediastinoscopy with lymph node biopsies which we performed and all the lymph node biopsies were negative for malignancy. They only showed chronic inflammation.  I recommended right upper lobectomy and probably superior segmentectomy for removal of the destroyed right upper lobe with extension into the superior segment to prevent recurrent infection and massive hemoptysis.  She want to delay surgery until at least November.  We did repeat her  CT scan which showed a slight decrease in the size of the cavitary lung mass and the wall thickness.  She finally agreed to undergo surgery.  I discussed the operative procedure with her including alternatives, benefits, and risks including, but not limited to bleeding, blood transfusion, pleural space infection, wound infection, bronchial stump complications including bronchopleural fistula, prolonged air leak, and the possibility that this may be cancer.  She understood all this and agreed to proceed.  OPERATIVE PROCEDURE:  The patient was seen in the preoperative holding area.  Preoperative intravenous Zinacef was given.  Proper patient, proper operation, proper operative side were confirmed after reviewing her CT scans and discussing with the patient.  The right side of the chest was signed by me.  She was taken back to the operating room and placed on table in supine position.  After induction of general endotracheal anesthesia using a single-lumen tube, flexible fiberoptic bronchoscopy was performed.  The distal trachea appeared normal.  The carina was sharp.  The right and left bronchial tree had normal segmental anatomy.  There were no endobronchial lesions and no sign of extrinsic compression.  There were some clear mucus secretions on both sides.  I specifically passed the scope out into the segmental bronchi of the right upper, middle, lower lobes  and did not see any lesions there.  The bronchoscope was then withdrawn from the patient.  A single- lumen tube was converted to a double-lumen tube by Anesthesiology.  A Foley catheter was placed in the bladder using sterile technique.  Lower extremity pneumatic compression devices were used.  The patient was then turned in the left lateral decubitus position with the right side up. The right-sided chest was prepped with Betadine soap and solution and draped in the usual sterile manner.  Time-out was taken.  Proper patient, proper  operation, proper operative side were confirmed with nursing and anesthesia staff with the patient's CT scan on the monitor in the operating room.  Then, the right chest was entered through a lateral thoracotomy incision.  The pleural space was entered through the 4th intercostal space.  Examination show that the right upper lobe was firmly adherent to the chest wall posteriorly and at the apex.  The upper lobe was very firm.  The middle lobe had an incomplete fissure with the upper lobe and had no visible abnormality within it.  The superior segment of lower lobe appeared to be involved in this right upper lobe lung process and was firmly adherent to it and also thickened and quite firm.  The basilar segments did not appear involved.  Then the right upper lobe was freed from the chest wall using electrocautery. This took a considerable amount of time due to the density of the adhesions, particularly posteriorly as we came along the chest wall there was an area where the lung abscess actually looked like it was burrowing into the intercostal space and as it was removed off the chest wall, the abscess cavity was opened.  There was small amount of purulent material that was removed.  After the right upper lobe was completely freed, the right upper lobectomy was started.  The mediastinal pleura over the hilum was opened.  The phrenic nerve was identified and carefully avoided.  The right upper lobe pulmonary artery branches were identified.  These were encircled with tapes and divided using a vascular stapler.  The right upper lobe pulmonary vein branch was identified and encircled with a tape and divided using a vascular stapler.  The right middle lobe vein was identified and preserved.  The azygos vein was preventing some of the mobilization around the hilum and this was divided using a vascular stapler.  There were several large lymph nodes around the hilum of the lung and these were taken  with the specimen.  Then, the major fissure was opened and anterior lobar portion of the pulmonary artery identified.  The basilar segmental arteries were identified.  The superior segmental artery was identified.  There was a single branch to the middle lobe and this was identified and preserved. Exposure of the superior segmental artery was very difficult and therefore I decided to divide the superior segment first.  This was performed by retracting the lung medially and dividing the posterior pleura.  Then, the superior segment was encircled with a tape.  This was divided using several firings of a linear stapler with thick staples. After dividing the superior segment, dissection was continued to expose the right lower lobe bronchus.  The superior segmental bronchus was identified and encircled with a tape and then divided using a TA-30 linear stapler with 4.8-mm staples.  Then dissection was continued to expose the superior segmental artery which was divided using a vascular stapler.  Then, the minor fissure between the upper and middle lobes  were divided using several firings of a thick linear stapler.  After the upper lobe was completely freed with the attached superior segment, the right upper lobe bronchus was identified and encircled with a tape and then divided using a TA-30 stapler with 4.8-mm staples.  The bronchus was divided distal to the stapler.  The specimen was then placed onto a separate towel and we took specimens from the abscess cavity for routine culture, AFB, fungus, anaerobic culture.  The remainder of specimen was sent to Pathology for permanent examination.  Before firing the stapler, we did check to make sure the middle and lower lobes completely inflated.  Then the chest was filled with saline solution and the bronchial stump of the upper lobe and superior segment were tested under 30 cm of water pressure and there were no visible air leaks.  There was small  amount of air leak coming from the lung staple lines.  These were coated with CoSeal to try to seal these staple line leaks.  Then, the inferior pulmonary ligament was divided up to the inferior pulmonary vein.  The middle and lower lobes were then pexed together using interrupted 3-0 Vicryl sutures to prevent rotation.  Then the On-Q catheter was placed through a separate stab incision in the posterior axillary line.  The introducer and trocar were inserted through this stab incision and advanced in a subpleural plane from below the incision level posteriorly and to 1 interspace above the incision level.  The introducer was removed and through the sheath the catheter was placed. The peel-away sheath was removed and the catheter fixed to skin with a silk suture.  It was injected with 5 mL of 0.5% Marcaine.  It was connected to the pain ball with 0.5% Marcaine.  Then, 2 chest tubes were placed with a 32-French Blake drain positioned posteriorly and a 28- French chest tube positioned anteriorly.  There was complete hemostasis. Then, the ribs were approximated with #2 Vicryl pericostal sutures.  The muscles were reapproximated with continuous #1 Vicryl suture. Subcutaneous tissue was closed with continuous 2-0 Vicryl and the skin with a 3-0 Vicryl subcuticular closure.  The sponge, needle, and instrument counts were correct according to the scrub nurse.  Dry sterile dressing was applied over the incision and around the chest tubes, which were hooked to Pleur-Evac suction.  The patient was then turned in the supine position, extubated, and transported to the Postanesthesia Care Unit in satisfactory and stable condition.     Evelene Croon, M.D.     BB/MEDQ  D:  11/06/2012  T:  11/06/2012  Job:  [id number]

## 2012-11-07 ENCOUNTER — Inpatient Hospital Stay (HOSPITAL_COMMUNITY): Payer: 59

## 2012-11-07 LAB — COMPREHENSIVE METABOLIC PANEL
ALT: 11 U/L (ref 0–35)
BUN: <3 mg/dL — ABNORMAL LOW (ref 6–23)
Calcium: 8.6 mg/dL (ref 8.4–10.5)
Creatinine, Ser: 0.49 mg/dL — ABNORMAL LOW (ref 0.50–1.10)
GFR calc Af Amer: >90 mL/min (ref >90)
Glucose, Bld: 124 mg/dL — ABNORMAL HIGH (ref 70–99)
Sodium: 132 mEq/L — ABNORMAL LOW (ref 135–145)
Total Protein: 6 g/dL (ref 6.0–8.3)

## 2012-11-07 LAB — CBC
Hemoglobin: 10.6 g/dL — ABNORMAL LOW (ref 12.0–15.0)
MCH: 34.6 pg — ABNORMAL HIGH (ref 26.0–34.0)
MCHC: 34.2 g/dL (ref 30.0–36.0)
MCV: 101.3 fL — ABNORMAL HIGH (ref 78.0–100.0)

## 2012-11-07 MED ORDER — VANCOMYCIN HCL 1000 MG IV SOLR
750.0000 mg | Freq: Two times a day (BID) | INTRAVENOUS | Status: DC
  Administered 2012-11-07 – 2012-11-11 (×10): 750 mg via INTRAVENOUS
  Filled 2012-11-07 (×10): qty 750

## 2012-11-07 MED ORDER — PIPERACILLIN-TAZOBACTAM 3.375 G IVPB
3.3750 g | Freq: Three times a day (TID) | INTRAVENOUS | Status: DC
  Administered 2012-11-07 – 2012-11-15 (×25): 3.375 g via INTRAVENOUS
  Filled 2012-11-07 (×28): qty 50

## 2012-11-07 NOTE — Progress Notes (Signed)
[facility] BRIEF SICU PROGRESS NOTE  2 Days Post-Op  S/P Procedure(s) (LRB): FLEXIBLE BRONCHOSCOPY (N/A) THORACOTOMY MAJOR (Right) LOBECTOMY (Right)   Stable day O2 sats 96-98% on 2 L/min  Plan: Continue current plan  [name],[name] 11/07/2012 6:34 PM

## 2012-11-07 NOTE — Progress Notes (Addendum)
2 Days Post-Op Procedure(s) (LRB): FLEXIBLE BRONCHOSCOPY (N/A) THORACOTOMY MAJOR (Right) LOBECTOMY (Right) Subjective:  Ms. Christine continues to complain of some pain this morning. It is manageable with PCA and oral pain medication.  She is ambulating some, but does ask if her foley catheter can be removed.   Objective: Vital signs in last 24 hours: Temp:  [98.2 F (36.8 C)-99.8 F (37.7 C)] 99 F (37.2 C) (12/04 0718) Pulse Rate:  [77-105] 102  (12/04 0700) Cardiac Rhythm:  [-] Normal sinus rhythm (12/04 0500) Resp:  [16-31] 25  (12/04 0700) BP: (83-112)/(49-71) 109/58 mmHg (12/04 0700) SpO2:  [90 %-100 %] 98 % (12/04 0700) FiO2 (%):  [97 %] 97 % (12/04 0410) Weight:  [114 lb 3.2 oz (51.8 kg)] 114 lb 3.2 oz (51.8 kg) (12/04 0600)   Intake/Output from previous day: 12/03 0701 - 12/04 0700 In: 2375.5 [P.O.:1680; I.V.:495.5; IV Piggyback:200] Out: 6045 [Urine:5450; Chest Tube:595]  General appearance: alert, cooperative and no distress Heart: regular rate and rhythm Lungs: clear to auscultation bilaterally Abdomen: soft, non-tender; bowel sounds normal; no masses,  no organomegaly Wound: clean and dry  Lab Results:  Basename 11/07/12 0431 11/06/12 0425  WBC 12.6* 10.5  HGB 10.6* 9.7*  HCT 31.0* 28.4*  PLT 350 352   BMET:  Basename 11/07/12 0431 11/06/12 0425  NA 132* 136  K 3.7 3.5  CL 95* 100  CO2 29 25  GLUCOSE 124* 104*  BUN <3* 4*  CREATININE 0.49* 0.53  CALCIUM 8.6 8.4    PT/INR: No results found for this basename: LABPROT,INR in the last 72 hours ABG    Component Value Date/Time   PHART 7.395 11/06/2012 0410   HCO3 25.0* 11/06/2012 0410   TCO2 26 11/06/2012 0410   ACIDBASEDEF 1.2 11/05/2012 0621   O2SAT 94.0 11/06/2012 0410   CBG (last 3)   Basename 11/06/12 1143 11/06/12 0828 11/06/12 0407  GLUCAP 122* 96 102*   CXR:  Partial collapse of right lung with apical space, probably some air space disease.   Assessment/Plan: S/P Procedure(s)  (LRB): FLEXIBLE BRONCHOSCOPY (N/A) THORACOTOMY MAJOR (Right) LOBECTOMY (Right)  1. Chest tubes in place- no air leak appreciated, on 10 cm of suction, 595cc out yesterday 2. Pulm- remains on oxygen, CXR pneumothorax appears to be slightly larger  3. D/C Foley catheter 4. CV- tachycardic this morning, rate in the low 100s, pressure controlled, will order IV Lopressor prn HR > 130 5. ID- ? Pneumonia Right lung, will start Vanc/Zosyn 6. Dispo- leave chest tubes in place, will increase suction back to 20cm   LOS: 2 days    BARRETT, [name] [name] 11/07/2012   Chart reviewed, patient examined, agree with above. She has some collapse and air space disease in right lung that is new since yesterday and is not breathing as deeply. She may be getting some pneumonia. Will keep both chest tubes in and start vanc/zosyn. Operative cultures negative so far. Path pending. She needs attention to IS, ambulation, pain control so she can deep breath.

## 2012-11-07 NOTE — Progress Notes (Signed)
ANTIBIOTIC CONSULT NOTE - INITIAL  Pharmacy Consult for Vancomycin and Zosyn Indication: pneumonia  Allergies  Allergen Reactions  . Alendronate Sodium Other (See Comments)    REACTION: chest pain  . Antihistamines, Chlorpheniramine-Type Other (See Comments)    REACTION: "makes lungs bleed"  . Benadryl (Diphenhydramine) Other (See Comments)    REACTION: "makes lungs bleed"  . Nsaids Other (See Comments)    REACTION: ischemic colitis  . Other Other (See Comments)    All anti-inflammatories.  REACTION: ischemic colitis  . Pseudoephedrine Other (See Comments)    REACTION: Ischemic colitis  . Robitussin A-C (Guaifenesin-Codeine) Itching  . Levofloxacin Anxiety    Patient Measurements: Height: 5\' 6"  (167.6 cm) Weight: 114 lb 3.2 oz (51.8 kg) IBW/kg (Calculated) : 59.3   Vital Signs: Temp: 99 F (37.2 C) (12/04 0718) Temp src: Oral (12/04 0718) BP: 101/61 mmHg (12/04 0800) Pulse Rate: 98  (12/04 0800) Intake/Output from previous day: 12/03 0701 - 12/04 0700 In: 2375.5 [P.O.:1680; I.V.:495.5; IV Piggyback:200] Out: 6095 [Urine:5500; Chest Tube:595] Intake/Output from this shift: Total I/O In: 70 [I.V.:20; IV Piggyback:50] Out: 60 [Urine:60]  Labs:  [facility] 11/07/12 0431 11/06/12 0425  WBC 12.6* 10.5  HGB 10.6* 9.7*  PLT 350 352  LABCREA -- --  CREATININE 0.49* 0.53   Estimated Creatinine Clearance: 65.7 ml/min (by C-G formula based on Cr of 0.49).  Microbiology: Recent Results (from the past 720 hour(s))  SURGICAL PCR SCREEN     Status: Normal   Collection Time   11/02/12 11:07 AM      Component Value Range Status Comment   MRSA, PCR NEGATIVE  NEGATIVE Final    Staphylococcus aureus NEGATIVE  NEGATIVE Final   AFB CULTURE WITH SMEAR     Status: Normal (Preliminary result)   Collection Time   11/05/12 12:12 PM      Component Value Range Status Comment   Specimen Description TISSUE LUNG RIGHT   Final    Special Requests RIGHT UPPER LUNG LOBE PT ON ZINACEF    Final    ACID FAST SMEAR NO ACID FAST BACILLI SEEN   Final    Culture     Final    Value: CULTURE WILL BE EXAMINED FOR 6 WEEKS BEFORE ISSUING A FINAL REPORT   Report Status PENDING   Incomplete   FUNGUS CULTURE W SMEAR     Status: Normal (Preliminary result)   Collection Time   11/05/12 12:12 PM      Component Value Range Status Comment   Specimen Description TISSUE LUNG RIGHT   Final    Special Requests RIGHT UPPER LUNG LOBE PT ON ZINACEF   Final    Fungal Smear NO YEAST OR FUNGAL ELEMENTS SEEN   Final    Culture CULTURE IN PROGRESS FOR FOUR WEEKS   Final    Report Status PENDING   Incomplete   TISSUE CULTURE     Status: Normal (Preliminary result)   Collection Time   11/05/12 12:12 PM      Component Value Range Status Comment   Specimen Description TISSUE LUNG RIGHT   Final    Special Requests RIGHT UPPER LUNG LOBE PT ON ZINACEF   Final    Gram Stain     Final    Value: RARE WBC PRESENT,BOTH PMN AND MONONUCLEAR     NO ORGANISMS SEEN   Culture NO GROWTH 2 DAYS   Final    Report Status PENDING   Incomplete   ANAEROBIC CULTURE     Status:  Normal (Preliminary result)   Collection Time   11/05/12 12:17 PM      Component Value Range Status Comment   Specimen Description TISSUE LUNG RIGHT   Final    Special Requests RIGHT UPPER LUNG LOBE PT ON ZINACEF   Final    Gram Stain PENDING   Incomplete    Culture     Final    Value: NO ANAEROBES ISOLATED; CULTURE IN PROGRESS FOR 5 DAYS   Report Status PENDING   Incomplete     Medical History: Past Medical History  Diagnosis Date  . COPD (chronic obstructive pulmonary disease)     per CT 11/10/05, will minimal bronchiectasis rll  . GERD (gastroesophageal reflux disease)   . Osteoporosis     dexa per gyn  . 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 Headache   . Lichen planus   . B12 deficiency   . Endometriosis   . Cataracts, bilateral   . Arthritis   . Lung mass   . Asthma     .  years ago- has not had problem for years.  .  Pneumonia     last ime 2008  . Hemorrhoid   . Pneumothorax, spontaneous, tension     x 2 in her 20's.  has a chest tube for one   Assessment:  Hx right upper lobe cavitary lung mass, possible source of recurrent infection and hemoptysis. Prior lymph node biopsies negative for malignancy but showed chronic inflammation.   POD #2 right thoracotomy, right upper lobectomy and flexible bronchoscopy.  Post-op Zinacef x 24 hrs completed 12/3.  Vancomycin 1gram IV x 1 given post-op 12/2 pm. To begin Vancomycin and Zosyn for pneumonia coverage.  2 chest tubes in place. Tmax 99.8, WBC 12.6. No organisms on tissue culture gram stain, final report pending.  Goal of Therapy:  Vancomycin trough level 15-20 mcg/ml Appropriate Zosyn dose for renal function and infection  Plan:  Vancomycin 750 mg IV q12hrs. Zosyn 3.375 grams IV q8hrs (each infused over 4 hours). Will follow renal funtion, culture data and clinical status.  Dennie Fetters, RPh Pager: [phone] 11/07/2012,8:35 AM

## 2012-11-08 ENCOUNTER — Inpatient Hospital Stay (HOSPITAL_COMMUNITY): Payer: 59

## 2012-11-08 LAB — BASIC METABOLIC PANEL
BUN: 3 mg/dL — ABNORMAL LOW (ref 6–23)
Creatinine, Ser: 0.52 mg/dL (ref 0.50–1.10)
GFR calc Af Amer: >90 mL/min (ref >90)
GFR calc non Af Amer: >90 mL/min (ref >90)

## 2012-11-08 LAB — CBC
HCT: 27 % — ABNORMAL LOW (ref 36.0–46.0)
MCHC: 34.8 g/dL (ref 30.0–36.0)
MCV: 101.1 fL — ABNORMAL HIGH (ref 78.0–100.0)
Platelets: 308 10*3/uL (ref 150–400)
RDW: 12.3 % (ref 11.5–15.5)

## 2012-11-08 LAB — TISSUE CULTURE

## 2012-11-08 MED ORDER — POTASSIUM CHLORIDE 10 MEQ/50ML IV SOLN
10.0000 meq | INTRAVENOUS | Status: AC
  Administered 2012-11-08 (×3): 10 meq via INTRAVENOUS

## 2012-11-08 MED ORDER — PANTOPRAZOLE SODIUM 40 MG PO TBEC: 80.0000 mg | DELAYED_RELEASE_TABLET | Freq: Every day | ORAL | Status: DC

## 2012-11-08 NOTE — Progress Notes (Signed)
3 Days Post-Op Procedure(s) (LRB): FLEXIBLE BRONCHOSCOPY (N/A) THORACOTOMY MAJOR (Right) LOBECTOMY (Right) Subjective: Slept well, had a good night. Walked around ICU. Pain under control  Objective: Vital signs in last 24 hours: Temp:  [98.1 F (36.7 C)-100.9 F (38.3 C)] 100.3 F (37.9 C) (12/05 0400) Pulse Rate:  [53-118] 80  (12/05 0700) Cardiac Rhythm:  [-] Sinus tachycardia (12/05 0730) Resp:  [12-32] 20  (12/05 0700) BP: (86-109)/(42-91) 96/61 mmHg (12/05 0700) SpO2:  [91 %-100 %] 95 % (12/05 0600) Weight:  [52.5 kg (115 lb 11.9 oz)] 52.5 kg (115 lb 11.9 oz) (12/05 0200)  Hemodynamic parameters for last 24 hours:    Intake/Output from previous day: 12/04 0701 - 12/05 0700 In: 1941.3 [P.O.:720; I.V.:571.3; IV Piggyback:650] Out: 3940 [Urine:3360; Chest Tube:580] Intake/Output this shift: Total I/O In: 80.5 [I.V.:30.5; IV Piggyback:50] Out: 30 [Chest Tube:30]  General appearance: alert and cooperative Heart: regular rate and rhythm, S1, S2 normal, no murmur, click, rub or gallop Lungs: diminished breath sounds RLL and RML Extremities: extremities normal, atraumatic, no cyanosis or edema Wound: dressing dry  Lab Results:  Basename 11/08/12 0400 11/07/12 0431  WBC 11.5* 12.6*  HGB 9.4* 10.6*  HCT 27.0* 31.0*  PLT 308 350   BMET:  Basename 11/08/12 0400 11/07/12 0431  NA 135 132*  K 3.5 3.7  CL 98 95*  CO2 29 29  GLUCOSE 154* 124*  BUN 3* <3*  CREATININE 0.52 0.49*  CALCIUM 8.4 8.6    PT/INR: No results found for this basename: LABPROT,INR in the last 72 hours ABG    Component Value Date/Time   PHART 7.395 11/06/2012 0410   HCO3 25.0* 11/06/2012 0410   TCO2 26 11/06/2012 0410   ACIDBASEDEF 1.2 11/05/2012 0621   O2SAT 94.0 11/06/2012 0410   CBG (last 3)   Basename 11/06/12 1143 11/06/12 0828 11/06/12 0407  GLUCAP 122* 96 102*   CXR:  Increased air space density in residual right lung. No effusion. Left lung looks clear.  Assessment/Plan: S/P  Procedure(s) (LRB): FLEXIBLE BRONCHOSCOPY (N/A) THORACOTOMY MAJOR (Right) LOBECTOMY (Right)  Increased air space disease in right lung and fever suggests pneumonia. It could also be asymmetric edema. Continue vanc and zosyn, IS, ambulation. She looks better than her cxr would suggest. Chest tube output decreasing. Will keep them in today but decrease suction to 10.   LOS: 3 days    [name] [name] K 11/08/2012

## 2012-11-08 NOTE — Progress Notes (Signed)
Patient ID: [name] [name], female   DOB: 08/30/1956, 56 y.o.   MRN: [id number]  SICU Evening Rounds:  Temp increased to 102 this afternoon. Hemodynamically stable. Feels ok. sats 95 on [place].  Diuresing spontaneously Eating Protonix changed to Prilosec since she says Protonix has not worked for her reflux.

## 2012-11-09 ENCOUNTER — Inpatient Hospital Stay (HOSPITAL_COMMUNITY): Payer: 59

## 2012-11-09 LAB — CBC
MCHC: 34.1 g/dL (ref 30.0–36.0)
Platelets: 299 10*3/uL (ref 150–400)
RDW: 12.4 % (ref 11.5–15.5)
WBC: 8.2 10*3/uL (ref 4.0–10.5)

## 2012-11-09 LAB — BASIC METABOLIC PANEL
GFR calc Af Amer: >90 mL/min (ref >90)
GFR calc non Af Amer: >90 mL/min (ref >90)
Potassium: 3.3 mEq/L — ABNORMAL LOW (ref 3.5–5.1)
Sodium: 137 mEq/L (ref 135–145)

## 2012-11-09 MED ORDER — PANTOPRAZOLE SODIUM 40 MG PO TBEC
40.0000 mg | DELAYED_RELEASE_TABLET | Freq: Two times a day (BID) | ORAL | Status: DC
  Administered 2012-11-09 – 2012-11-19 (×21): 40 mg via ORAL
  Filled 2012-11-09 (×20): qty 1

## 2012-11-09 MED ORDER — ACETYLCYSTEINE 20 % IN SOLN: 1.0000 mL | RESPIRATORY_TRACT | Status: DC

## 2012-11-09 MED ORDER — ACETYLCYSTEINE 20 % IN SOLN
1.0000 mL | Freq: Four times a day (QID) | RESPIRATORY_TRACT | Status: DC
  Filled 2012-11-09 (×3): qty 4

## 2012-11-09 MED ORDER — LEVALBUTEROL HCL 0.63 MG/3ML IN NEBU
0.6300 mg | INHALATION_SOLUTION | RESPIRATORY_TRACT | Status: DC
  Administered 2012-11-09 – 2012-11-10 (×8): 0.63 mg via RESPIRATORY_TRACT
  Filled 2012-11-09 (×11): qty 3

## 2012-11-09 MED ORDER — ACETYLCYSTEINE 20 % IN SOLN: 1.0000 mL | Freq: Four times a day (QID) | RESPIRATORY_TRACT | Status: DC

## 2012-11-09 MED ORDER — CALCIUM CARBONATE ANTACID 500 MG PO CHEW
1.0000 | CHEWABLE_TABLET | Freq: Three times a day (TID) | ORAL | Status: DC
  Administered 2012-11-09 – 2012-11-19 (×30): 200 mg via ORAL
  Filled 2012-11-09 (×34): qty 1

## 2012-11-09 MED ORDER — POTASSIUM CHLORIDE CRYS ER 20 MEQ PO TBCR
40.0000 meq | EXTENDED_RELEASE_TABLET | Freq: Once | ORAL | Status: AC
  Administered 2012-11-09: 40 meq via ORAL
  Filled 2012-11-09: qty 2

## 2012-11-09 MED ORDER — ACETYLCYSTEINE 20 % IN SOLN
1.0000 mL | RESPIRATORY_TRACT | Status: AC
  Administered 2012-11-09 – 2012-11-10 (×7): 1 mL via RESPIRATORY_TRACT
  Filled 2012-11-09 (×8): qty 4

## 2012-11-09 MED ORDER — POTASSIUM CHLORIDE 10 MEQ/50ML IV SOLN
10.0000 meq | INTRAVENOUS | Status: AC
  Administered 2012-11-09 (×2): 10 meq via INTRAVENOUS

## 2012-11-09 MED ORDER — GUAIFENESIN ER 600 MG PO TB12
1200.0000 mg | ORAL_TABLET | Freq: Two times a day (BID) | ORAL | Status: DC
  Administered 2012-11-09 – 2012-11-14 (×12): 1200 mg via ORAL
  Administered 2012-11-15: 600 mg via ORAL
  Administered 2012-11-15: 1200 mg via ORAL
  Administered 2012-11-16 – 2012-11-19 (×3): 600 mg via ORAL
  Filled 2012-11-09 (×24): qty 2

## 2012-11-09 MED ORDER — ACETYLCYSTEINE 10 % IN SOLN
2.0000 mL | Freq: Four times a day (QID) | RESPIRATORY_TRACT | Status: DC
  Filled 2012-11-09 (×10): qty 4

## 2012-11-09 NOTE — Progress Notes (Signed)
Pt removed nicotine patch and stated that "I think this is the reason I am feeling sick." Pt states she takes the lozenges at home. Will continue to monitor patient.

## 2012-11-09 NOTE — Progress Notes (Signed)
Patient evaluated for long-term disease management services with [facility] as a benefit of being a [employer] employee with [employer]. Met with Ms Marsan at bedside to offer any assistance. States she is the primary caregiver for her mother who has dementia. States her sisters do not live locally but she suspects they will arrange something for the elderly mother regarding care.. Ms Hitchcock also reports she is not sure if she will have help at home upon discharge. Explained to her that she will receive a post discharge transition of care call upon discharge. Verified best phone contact for her. Will speak with inpatient case manager about conversation with patient. Will also continue to follow and assist as needed.      Raiford Noble, RN,BSN, MSN [facility] Liaison [phone]

## 2012-11-09 NOTE — Progress Notes (Signed)
Patient ID: [name] [name], female   DOB: 1956/04/12, 56 y.o.   MRN: [id number]                   [address]            [place], [address]          [id number]     4 Days Post-Op Procedure(s) (LRB): FLEXIBLE BRONCHOSCOPY (N/A) THORACOTOMY MAJOR (Right) LOBECTOMY (Right)  Total Length of Stay:  LOS: 4 days  BP 92/61  Pulse 113  Temp 98.7 F (37.1 C) (Oral)  Resp 19  Ht 5\' 6"  (1.676 m)  Wt 115 lb 4.8 oz (52.3 kg)  BMI 18.61 kg/m2  SpO2 94%  LMP 11/05/2004  .Intake/Output      12/05 0701 - 12/06 0700 12/06 0701 - 12/07 0700   P.O. 600 1140   I.V. (mL/kg) 554.4 (10.6) 296.5 (5.7)   IV Piggyback 602 302   Total Intake(mL/kg) 1756.4 (33.6) 1738.5 (33.2)   Urine (mL/kg/hr) 4700 (3.7) 1500 (2.4)   Chest Tube 220 260   Total Output 4920 1760   Net -3163.7 -21.5             . dextrose 5 % and 0.45% NaCl 20 mL/hr (11/08/12 0609)     Lab Results  Component Value Date   WBC 8.2 11/09/2012   HGB 8.8* 11/09/2012   HCT 25.8* 11/09/2012   PLT 299 11/09/2012   GLUCOSE 117* 11/09/2012   CHOL 162 09/24/2012   TRIG 57.0 09/24/2012   HDL 65.70 09/24/2012   LDLDIRECT 124.4 07/19/2007   LDLCALC 85 09/24/2012   ALT 11 11/07/2012   AST 21 11/07/2012   NA 137 11/09/2012   K 3.3* 11/09/2012   CL 98 11/09/2012   CREATININE 0.50 11/09/2012   BUN 4* 11/09/2012   CO2 29 11/09/2012   TSH 0.76 12/13/2011   INR 1.01 11/02/2012   Patient asleep Vs stable  Delight Ovens MD  Beeper [phone] Office [phone] 11/09/2012 6:56 PM

## 2012-11-09 NOTE — Progress Notes (Signed)
4 Days Post-Op Procedure(s) (LRB): FLEXIBLE BRONCHOSCOPY (N/A) THORACOTOMY MAJOR (Right) LOBECTOMY (Right) Subjective:  Slept well, breathing ok. Pain under control. Coughing a lot but not bringing up too much  Objective: Vital signs in last 24 hours: Temp:  [98.2 F (36.8 C)-102 F (38.9 C)] 98.2 F (36.8 C) (12/06 0718) Pulse Rate:  [68-122] 96  (12/06 0700) Cardiac Rhythm:  [-] Normal sinus rhythm (12/06 0000) Resp:  [11-29] 18  (12/06 0700) BP: (84-123)/(48-77) 90/58 mmHg (12/06 0700) SpO2:  [92 %-100 %] 93 % (12/06 0700) Weight:  [52.3 kg (115 lb 4.8 oz)] 52.3 kg (115 lb 4.8 oz) (12/06 0600)  Hemodynamic parameters for last 24 hours:    Intake/Output from previous day: 12/05 0701 - 12/06 0700 In: 1756.4 [P.O.:600; I.V.:554.4; IV Piggyback:602] Out: 4920 [Urine:4700; Chest Tube:220] Intake/Output this shift:    General appearance: alert and cooperative Heart: regular rate and rhythm, S1, S2 normal, no murmur, click, rub or gallop Lungs: rales and rhonchi over right lung} Extremities: no edema Wound: dressing dry No air leak from chest tube.  Lab Results:  Basename 11/09/12 0400 11/08/12 0400  WBC 8.2 11.5*  HGB 8.8* 9.4*  HCT 25.8* 27.0*  PLT 299 308   BMET:  Basename 11/09/12 0400 11/08/12 0400  NA 137 135  K 3.3* 3.5  CL 98 98  CO2 29 29  GLUCOSE 117* 154*  BUN 4* 3*  CREATININE 0.50 0.52  CALCIUM 8.7 8.4    PT/INR: No results found for this basename: LABPROT,INR in the last 72 hours ABG    Component Value Date/Time   PHART 7.395 11/06/2012 0410   HCO3 25.0* 11/06/2012 0410   TCO2 26 11/06/2012 0410   ACIDBASEDEF 1.2 11/05/2012 0621   O2SAT 94.0 11/06/2012 0410   CBG (last 3)   Basename 11/06/12 1143 11/06/12 0828  GLUCAP 122* 96   CXR:  Dense right lung infiltrate. Left lung clear. Small right space/ptx. No effusion. Assessment/Plan: S/P Procedure(s) (LRB): FLEXIBLE BRONCHOSCOPY (N/A) THORACOTOMY MAJOR (Right) LOBECTOMY (Right) Postop  pneumonia in residual right lung. Possibly related to aspiration. Continue antibiotics,bronchodilators, IS. Add mucomyst and guaifenesin. Will keep both chest tubes in since she is coughing a lot and there is a lot of tidaling.   LOS: 4 days    Anelise Staron K 11/09/2012

## 2012-11-10 ENCOUNTER — Inpatient Hospital Stay (HOSPITAL_COMMUNITY): Payer: 59

## 2012-11-10 LAB — CBC
Hemoglobin: 8.6 g/dL — ABNORMAL LOW (ref 12.0–15.0)
MCHC: 33.7 g/dL (ref 30.0–36.0)
RBC: 2.57 MIL/uL — ABNORMAL LOW (ref 3.87–5.11)
WBC: 8.2 10*3/uL (ref 4.0–10.5)

## 2012-11-10 LAB — ANAEROBIC CULTURE

## 2012-11-10 MED ORDER — LEVALBUTEROL HCL 0.63 MG/3ML IN NEBU
0.6300 mg | INHALATION_SOLUTION | Freq: Four times a day (QID) | RESPIRATORY_TRACT | Status: DC
  Administered 2012-11-11 – 2012-11-19 (×32): 0.63 mg via RESPIRATORY_TRACT
  Filled 2012-11-10 (×39): qty 3

## 2012-11-10 NOTE — Progress Notes (Signed)
Patient ID: [name] [name], female   DOB: Dec 26, 1955, 56 y.o.   MRN: [id number] TCTS DAILY PROGRESS NOTE                   [address]            [place] [address]          [phone]      5 Days Post-Op Procedure(s) (LRB): FLEXIBLE BRONCHOSCOPY (N/A) THORACOTOMY MAJOR (Right) LOBECTOMY (Right)  Total Length of Stay:  LOS: 5 days   Subjective: Still with pain control problems but better then yesterday still on pca  Objective: Vital signs in last 24 hours: Temp:  [98.7 F (37.1 C)-100.3 F (37.9 C)] 98.8 F (37.1 C) (12/07 0818) Pulse Rate:  [95-119] 103  (12/07 0500) Cardiac Rhythm:  [-] Sinus tachycardia (12/07 0400) Resp:  [13-35] 24  (12/07 0900) BP: (88-114)/(49-76) 99/60 mmHg (12/07 0900) SpO2:  [90 %-99 %] 90 % (12/07 0900) Weight:  [115 lb 15.4 oz (52.6 kg)] 115 lb 15.4 oz (52.6 kg) (12/07 0400)  Filed Weights   11/08/12 0200 11/09/12 0600 11/10/12 0400  Weight: 115 lb 11.9 oz (52.5 kg) 115 lb 4.8 oz (52.3 kg) 115 lb 15.4 oz (52.6 kg)    Weight change: 10.6 oz (0.3 kg)   Hemodynamic parameters for last 24 hours:    Intake/Output from previous day: 12/06 0701 - 12/07 0700 In: 2223.4 [P.O.:1140; I.V.:581.4; IV Piggyback:502] Out: 4680 [Urine:4300; Chest Tube:380]  Intake/Output this shift:    Current Meds: Scheduled Meds:   . acetylcysteine  1 mL Nebulization Q4H  . bisacodyl  10 mg Oral Daily  . calcium carbonate  1 tablet Oral TID WC  . feeding supplement  1 Container Oral TID BM  . fentaNYL   Intravenous Q4H  . guaiFENesin  1,200 mg Oral BID  . levalbuterol  0.63 mg Nebulization Q4H  . nicotine  14 mg Transdermal Daily  . pantoprazole  40 mg Oral BID AC  . piperacillin-tazobactam (ZOSYN)  IV  3.375 g Intravenous Q8H  . polyethylene glycol  17 g Oral Daily  . [COMPLETED] potassium chloride  40 mEq Oral Once  . vancomycin  750 mg Intravenous Q12H  . [DISCONTINUED] acetylcysteine  2 mL Nebulization QID  . [DISCONTINUED]  acetylcysteine  1 mL Nebulization QID  . [DISCONTINUED] acetylcysteine  1 mL Nebulization QID  . [DISCONTINUED] acetylcysteine  1 mL Nebulization QID  . [DISCONTINUED] acetylcysteine  1 mL Nebulization Q4H  . [DISCONTINUED] levalbuterol  0.63 mg Nebulization Q6H   Continuous Infusions:   . dextrose 5 % and 0.45% NaCl 20 mL/hr (11/08/12 0609)   PRN Meds:.ALPRAZolam, naloxone, ondansetron (ZOFRAN) IV, ondansetron (ZOFRAN) IV, oxyCODONE-acetaminophen, potassium chloride, senna-docusate, sodium chloride, traMADol  General appearance: alert and cooperative Neurologic: intact Heart: regular rate and rhythm, S1, S2 normal, no murmur, click, rub or gallop and normal apical impulse Lungs: diminished breath sounds bilaterally Abdomen: soft, non-tender; bowel sounds normal; no masses,  no organomegaly Extremities: extremities normal, atraumatic, no cyanosis or edema and Homans sign is negative, no sign of DVT Wound: still with air leak with cough 2+  Lab Results: CBC: Basename 11/10/12 0435 11/09/12 0400  WBC 8.2 8.2  HGB 8.6* 8.8*  HCT 25.5* 25.8*  PLT 329 299   BMET:  Basename 11/09/12 0400 11/08/12 0400  NA 137 135  K 3.3* 3.5  CL 98 98  CO2 29 29  GLUCOSE 117* 154*  BUN 4* 3*  CREATININE 0.50  0.52  CALCIUM 8.7 8.4    PT/INR: No results found for this basename: LABPROT,INR in the last 72 hours Radiology: Dg Chest Port 1 View  11/10/2012  *RADIOLOGY REPORT*  Clinical Data: Evaluate right chest tubes.  PORTABLE CHEST - 1 VIEW  Comparison: 11/09/2012  Findings: Stable position of the right chest drains.  There is stable right apical pneumothorax that roughly measures 15%. Persistent parenchymal densities throughout the right lung.  Left lung is clear with exception of left basilar atelectasis.  Heart and mediastinum are stable.  The jugular central venous catheter is in the lower SVC region.  IMPRESSION: No significant change in the size of the right pneumothorax. Stable patchy  densities throughout the right lung.  New patchy densities at the left lung may represent atelectasis.   Original Report Authenticated By: Richarda Overlie, M.D.    Dg Chest Port 1 View  11/09/2012  *RADIOLOGY REPORT*  Clinical Data: Chest tubes.  Pneumothorax.  PORTABLE CHEST - 1 VIEW  Comparison: Chest 11/08/2012.  Findings: Two right chest tubes and right IJ catheter remain in place.  Small right apical pneumothorax is unchanged in appearance. There has been increase in right pleural effusion.  Airspace disease throughout the right chest appears increased.  Volume loss on the right with shift of the mediastinal contents again from left to right is again seen.  Left lung is clear.  Cardiac silhouette is partially obscured.  IMPRESSION:  1.  No change in a small right pneumothorax with a chest tube in place. 2.  Increased right effusion and airspace disease.   Original Report Authenticated By: Holley Dexter, M.D.      Assessment/Plan: S/P Procedure(s) (LRB): FLEXIBLE BRONCHOSCOPY (N/A) THORACOTOMY MAJOR (Right) LOBECTOMY (Right) Mobilize still with airleak will leave ct to suction Postop pneumonia in residual right lung. Possibly related to aspiration. Continue antibiotics,bronchodilators, IS. Add mucomyst and guaifenesin.  Will keep both chest tubes in since she is coughing a lot and there is a lot of tidaling.     Gizel Riedlinger B 11/10/2012 9:19 AM

## 2012-11-10 NOTE — Progress Notes (Signed)
ANTIBIOTIC CONSULT NOTE - FOLLOW UP  Pharmacy Consult for Vancomycin and Zosyn Indication: pneumonia  Allergies  Allergen Reactions  . Alendronate Sodium Other (See Comments)    REACTION: chest pain  . Antihistamines, Chlorpheniramine-Type Other (See Comments)    REACTION: "makes lungs bleed"  . Benadryl (Diphenhydramine) Other (See Comments)    REACTION: "makes lungs bleed"  . Nsaids Other (See Comments)    REACTION: ischemic colitis  . Other Other (See Comments)    All anti-inflammatories.  REACTION: ischemic colitis  . Pseudoephedrine Other (See Comments)    REACTION: Ischemic colitis  . Robitussin A-C (Guaifenesin-Codeine) Itching  . Levofloxacin Anxiety    Patient Measurements: Height: 5\' 6"  (167.6 cm) Weight: 115 lb 15.4 oz (52.6 kg) IBW/kg (Calculated) : 59.3   Vital Signs: Temp: 98.3 F (36.8 C) (12/07 1200) Temp src: Oral (12/07 1200) BP: 86/46 mmHg (12/07 1100) Pulse Rate: 103  (12/07 0500) Intake/Output from previous day: 12/06 0701 - 12/07 0700 In: 2223.4 [P.O.:1140; I.V.:581.4; IV Piggyback:502] Out: 4680 [Urine:4300; Chest Tube:380] Intake/Output from this shift: Total I/O In: 352 [P.O.:240; I.V.:112] Out: 1205 [Urine:1175; Chest Tube:30]  Labs:  [facility] 11/10/12 0435 11/09/12 0400 11/08/12 0400  WBC 8.2 8.2 11.5*  HGB 8.6* 8.8* 9.4*  PLT 329 299 308  LABCREA -- -- --  CREATININE -- 0.50 0.52   Estimated Creatinine Clearance: 66.8 ml/min (by C-G formula based on Cr of 0.5).  Microbiology: Recent Results (from the past 720 hour(s))  SURGICAL PCR SCREEN     Status: Normal   Collection Time   11/02/12 11:07 AM      Component Value Range Status Comment   MRSA, PCR NEGATIVE  NEGATIVE Final    Staphylococcus aureus NEGATIVE  NEGATIVE Final   AFB CULTURE WITH SMEAR     Status: Normal (Preliminary result)   Collection Time   11/05/12 12:12 PM      Component Value Range Status Comment   Specimen Description TISSUE LUNG RIGHT   Final    Special  Requests RIGHT UPPER LUNG LOBE PT ON ZINACEF   Final    ACID FAST SMEAR NO ACID FAST BACILLI SEEN   Final    Culture     Final    Value: CULTURE WILL BE EXAMINED FOR 6 WEEKS BEFORE ISSUING A FINAL REPORT   Report Status PENDING   Incomplete   FUNGUS CULTURE W SMEAR     Status: Normal (Preliminary result)   Collection Time   11/05/12 12:12 PM      Component Value Range Status Comment   Specimen Description TISSUE LUNG RIGHT   Final    Special Requests RIGHT UPPER LUNG LOBE PT ON ZINACEF   Final    Fungal Smear NO YEAST OR FUNGAL ELEMENTS SEEN   Final    Culture CULTURE IN PROGRESS FOR FOUR WEEKS   Final    Report Status PENDING   Incomplete   TISSUE CULTURE     Status: Normal   Collection Time   11/05/12 12:12 PM      Component Value Range Status Comment   Specimen Description TISSUE LUNG RIGHT   Final    Special Requests RIGHT UPPER LUNG LOBE PT ON ZINACEF   Final    Gram Stain     Final    Value: RARE WBC PRESENT,BOTH PMN AND MONONUCLEAR     NO ORGANISMS SEEN   Culture NO GROWTH 3 DAYS   Final    Report Status 11/08/2012 FINAL   Final  ANAEROBIC CULTURE     Status: Normal   Collection Time   11/05/12 12:17 PM      Component Value Range Status Comment   Specimen Description TISSUE LUNG RIGHT   Final    Special Requests RIGHT UPPER LUNG LOBE PT ON ZINACEF   Final    Gram Stain     Final    Value: RARE WBC PRESENT,BOTH PMN AND MONONUCLEAR     NO ORGANISMS SEEN   Culture NO ANAEROBES ISOLATED   Final    Report Status 11/10/2012 FINAL   Final    Assessment:  POD # 5 right thoracotomy and right upper lobectomy.  Day # 4 Vancomycin 750 mg IV q12hrs and Zosyn 3.375 grams IV q8hrs (each over 4 hours) for pneumonia coverage.  Cultures negative, Tmax 100.3. WBC 8.2.  2 chest tubes in place.    Goal of Therapy:  Vancomycin trough level 15-20 mcg/ml appropriate Zoysn dose for renal function and infection  Plan:   Will check Vancomycin trough level before 9am dose on 12/8, to be sure  safe and adequate coverage is being provided.  Bmet in am.  Dennie Fetters, RPh Pager: [phone] 11/10/2012,1:17 PM

## 2012-11-10 NOTE — Progress Notes (Signed)
Patient ID: [name] [name], female   DOB: [birth year], 56 y.o.   MRN: [id number]                   [address]            [place] [address]          [phone]     5 Days Post-Op Procedure(s) (LRB): FLEXIBLE BRONCHOSCOPY (N/A) THORACOTOMY MAJOR (Right) LOBECTOMY (Right)  Total Length of Stay:  LOS: 5 days  BP 96/50  Pulse 103  Temp 98.5 F (36.9 C) (Oral)  Resp 17  Ht 5\' 6"  (1.676 m)  Wt 115 lb 15.4 oz (52.6 kg)  BMI 18.72 kg/m2  SpO2 94%  LMP 11/05/2004  .Intake/Output      12/07 0701 - 12/08 0700   P.O. 240   I.V. (mL/kg) 112 (2.1)   IV Piggyback    Total Intake(mL/kg) 352 (6.7)   Urine (mL/kg/hr) 1950 (3)   Chest Tube 30   Total Output 1980   Net -1628            . dextrose 5 % and 0.45% NaCl 20 mL/hr (11/08/12 0609)     Lab Results  Component Value Date   WBC 8.2 11/10/2012   HGB 8.6* 11/10/2012   HCT 25.5* 11/10/2012   PLT 329 11/10/2012   GLUCOSE 117* 11/09/2012   CHOL 162 09/24/2012   TRIG 57.0 09/24/2012   HDL 65.70 09/24/2012   LDLDIRECT 124.4 07/19/2007   LDLCALC 85 09/24/2012   ALT 11 11/07/2012   AST 21 11/07/2012   NA 137 11/09/2012   K 3.3* 11/09/2012   CL 98 11/09/2012   CREATININE 0.50 11/09/2012   BUN 4* 11/09/2012   CO2 29 11/09/2012   TSH 0.76 12/13/2011   INR 1.01 11/02/2012   Stable day just back from walking Still with air leak from CT   Delight Ovens MD  Beeper [phone] Office [phone] 11/10/2012 7:20 PM

## 2012-11-10 NOTE — Progress Notes (Signed)
11/10/12 1540  Vitals  BP 110/61 mmHg  MAP (mmHg) 65   ECG Heart Rate 93   Resp 18     Pt experienced a 2.8 seconds run of SVT with a HR of about 200.  Patient was asymptomatic.

## 2012-11-11 ENCOUNTER — Inpatient Hospital Stay (HOSPITAL_COMMUNITY): Payer: 59

## 2012-11-11 LAB — CBC
HCT: 25.8 % — ABNORMAL LOW (ref 36.0–46.0)
Hemoglobin: 8.9 g/dL — ABNORMAL LOW (ref 12.0–15.0)
MCH: 34.8 pg — ABNORMAL HIGH (ref 26.0–34.0)
MCHC: 34.5 g/dL (ref 30.0–36.0)
MCV: 100.8 fL — ABNORMAL HIGH (ref 78.0–100.0)
Platelets: 402 10*3/uL — ABNORMAL HIGH (ref 150–400)
RBC: 2.56 MIL/uL — ABNORMAL LOW (ref 3.87–5.11)
RDW: 12.6 % (ref 11.5–15.5)
WBC: 7.9 10*3/uL (ref 4.0–10.5)

## 2012-11-11 LAB — BASIC METABOLIC PANEL
BUN: 6 mg/dL (ref 6–23)
CO2: 29 mEq/L (ref 19–32)
Calcium: 8.9 mg/dL (ref 8.4–10.5)
Chloride: 96 mEq/L (ref 96–112)
Creatinine, Ser: 0.55 mg/dL (ref 0.50–1.10)
GFR calc Af Amer: >90 mL/min (ref >90)
GFR calc non Af Amer: >90 mL/min (ref >90)
Glucose, Bld: 107 mg/dL — ABNORMAL HIGH (ref 70–99)
Potassium: 4 mEq/L (ref 3.5–5.1)
Sodium: 134 mEq/L — ABNORMAL LOW (ref 135–145)

## 2012-11-11 MED ORDER — VANCOMYCIN HCL 1000 MG IV SOLR
750.0000 mg | Freq: Three times a day (TID) | INTRAVENOUS | Status: DC
  Administered 2012-11-12 – 2012-11-14 (×7): 750 mg via INTRAVENOUS
  Filled 2012-11-11 (×10): qty 750

## 2012-11-11 NOTE — Progress Notes (Signed)
ANTIBIOTIC CONSULT NOTE - FOLLOW UP  Pharmacy Consult for Vancomycin and Zosyn Indication: pneumonia  Allergies  Allergen Reactions  . Alendronate Sodium Other (See Comments)    REACTION: chest pain  . Antihistamines, Chlorpheniramine-Type Other (See Comments)    REACTION: "makes lungs bleed"  . Benadryl (Diphenhydramine) Other (See Comments)    REACTION: "makes lungs bleed"  . Nsaids Other (See Comments)    REACTION: ischemic colitis  . Other Other (See Comments)    All anti-inflammatories.  REACTION: ischemic colitis  . Pseudoephedrine Other (See Comments)    REACTION: Ischemic colitis  . Robitussin A-C (Guaifenesin-Codeine) Itching  . Levofloxacin Anxiety    Patient Measurements: Height: 5\' 6"  (167.6 cm) Weight: 114 lb 13.8 oz (52.1 kg) IBW/kg (Calculated) : 59.3   Vital Signs: Temp: 99.6 F (37.6 C) (12/08 1900) Temp src: Oral (12/08 1900) BP: 87/43 mmHg (12/08 2100) Intake/Output from previous day: 12/07 0701 - 12/08 0700 In: 2063.5 [P.O.:1230; I.V.:383.5; IV Piggyback:450] Out: 3860 [Urine:3700; Chest Tube:160] Intake/Output from this shift: Total I/O In: 464.2 [P.O.:400; I.V.:39.2; IV Piggyback:25] Out: 400 [Urine:400]  Labs:  [facility] 11/11/12 0440 11/10/12 0435 11/09/12 0400  WBC 7.9 8.2 8.2  HGB 8.9* 8.6* 8.8*  PLT 402* 329 299  LABCREA -- -- --  CREATININE 0.55 -- 0.50   Estimated Creatinine Clearance: 66.1 ml/min (by C-G formula based on Cr of 0.55).  Microbiology: Recent Results (from the past 720 hour(s))  SURGICAL PCR SCREEN     Status: Normal   Collection Time   11/02/12 11:07 AM      Component Value Range Status Comment   MRSA, PCR NEGATIVE  NEGATIVE Final    Staphylococcus aureus NEGATIVE  NEGATIVE Final   AFB CULTURE WITH SMEAR     Status: Normal (Preliminary result)   Collection Time   11/05/12 12:12 PM      Component Value Range Status Comment   Specimen Description TISSUE LUNG RIGHT   Final    Special Requests RIGHT UPPER LUNG  LOBE PT ON ZINACEF   Final    ACID FAST SMEAR NO ACID FAST BACILLI SEEN   Final    Culture     Final    Value: CULTURE WILL BE EXAMINED FOR 6 WEEKS BEFORE ISSUING A FINAL REPORT   Report Status PENDING   Incomplete   FUNGUS CULTURE W SMEAR     Status: Normal (Preliminary result)   Collection Time   11/05/12 12:12 PM      Component Value Range Status Comment   Specimen Description TISSUE LUNG RIGHT   Final    Special Requests RIGHT UPPER LUNG LOBE PT ON ZINACEF   Final    Fungal Smear NO YEAST OR FUNGAL ELEMENTS SEEN   Final    Culture CULTURE IN PROGRESS FOR FOUR WEEKS   Final    Report Status PENDING   Incomplete   TISSUE CULTURE     Status: Normal   Collection Time   11/05/12 12:12 PM      Component Value Range Status Comment   Specimen Description TISSUE LUNG RIGHT   Final    Special Requests RIGHT UPPER LUNG LOBE PT ON ZINACEF   Final    Gram Stain     Final    Value: RARE WBC PRESENT,BOTH PMN AND MONONUCLEAR     NO ORGANISMS SEEN   Culture NO GROWTH 3 DAYS   Final    Report Status 11/08/2012 FINAL   Final   ANAEROBIC CULTURE  Status: Normal   Collection Time   11/05/12 12:17 PM      Component Value Range Status Comment   Specimen Description TISSUE LUNG RIGHT   Final    Special Requests RIGHT UPPER LUNG LOBE PT ON ZINACEF   Final    Gram Stain     Final    Value: RARE WBC PRESENT,BOTH PMN AND MONONUCLEAR     NO ORGANISMS SEEN   Culture NO ANAEROBES ISOLATED   Final    Report Status 11/10/2012 FINAL   Final    Assessment:  POD # 5 right thoracotomy and right upper lobectomy.  Day # 4 Vancomycin 750 mg IV q12hrs and Zosyn 3.375 grams IV q8hrs (each over 4 hours) for pneumonia coverage.  Cultures negative, Tmax 101.7. WBC 7.9.  2 chest tubes in place.  Vancomycin trough 5.6 < goal  all doses charted.   Goal of Therapy:  Vancomycin trough level 15-20 mcg/ml appropriate Zoysn dose for renal function and infection  Plan:  Increase vancomycin 750mg  IV q8  Marcelino Scot, [place] Pager: [phone] 11/11/2012,10:03 PM

## 2012-11-11 NOTE — Plan of Care (Signed)
Problem: Phase II Progression Outcomes Goal: Weaning O2 for sats > or equal to 88% Outcome: Progressing Still requiring 4 L [place]. Sats did not drop as much with ambulation today or with activity. Patient's recovery time is improving.

## 2012-11-11 NOTE — Progress Notes (Signed)
Patient ID: [name] [name], female   DOB: 12/28/1955, 56 y.o.   MRN: [id number]                   [address]            [place] [address]          [phone]     6 Days Post-Op Procedure(s) (LRB): FLEXIBLE BRONCHOSCOPY (N/A) THORACOTOMY MAJOR (Right) LOBECTOMY (Right)  Total Length of Stay:  LOS: 6 days  BP 102/52  Pulse 89  Temp 101.3 F (38.5 C) (Oral)  Resp 33  Ht 5\' 6"  (1.676 m)  Wt 114 lb 13.8 oz (52.1 kg)  BMI 18.54 kg/m2  SpO2 95%  LMP 11/05/2004  .Intake/Output      12/07 0701 - 12/08 0700 12/08 0701 - 12/09 0700   P.O. 1230 1100   I.V. (mL/kg) 383.5 (7.4) 201.2 (3.9)   IV Piggyback 450 200   Total Intake(mL/kg) 2063.5 (39.6) 1501.2 (28.8)   Urine (mL/kg/hr) 3700 (3) 2250 (3.8)   Chest Tube 160    Total Output 3860 2250   Net -1796.5 -748.8             . dextrose 5 % and 0.45% NaCl 20 mL/hr at 11/11/12 1300     Lab Results  Component Value Date   WBC 7.9 11/11/2012   HGB 8.9* 11/11/2012   HCT 25.8* 11/11/2012   PLT 402* 11/11/2012   GLUCOSE 107* 11/11/2012   CHOL 162 09/24/2012   TRIG 57.0 09/24/2012   HDL 65.70 09/24/2012   LDLDIRECT 124.4 07/19/2007   LDLCALC 85 09/24/2012   ALT 11 11/07/2012   AST 21 11/07/2012   NA 134* 11/11/2012   K 4.0 11/11/2012   CL 96 11/11/2012   CREATININE 0.55 11/11/2012   BUN 6 11/11/2012   CO2 29 11/11/2012   TSH 0.76 12/13/2011   INR 1.01 11/02/2012   temp up to 101.7 tonight, sputum blood  Central line 54 days old, remains on zosyn and vancomycin  Delight Ovens MD  Beeper [phone] Office [phone] 11/11/2012 6:31 PM

## 2012-11-11 NOTE — Plan of Care (Signed)
Problem: Phase III Progression Outcomes Goal: Ambulates with dyspnea controlled Outcome: Progressing Still dyspnea with walking Goal: O2 sats > 88% on room air Outcome: Not Progressing Still requiring significant oxygen, especially with activity or sleep

## 2012-11-11 NOTE — Progress Notes (Signed)
Patient ID: [name] [name], female   DOB: 1956-11-04, 56 y.o.   MRN: [id number] TCTS DAILY PROGRESS NOTE                   [address]            [place] [address]          [phone]      6 Days Post-Op Procedure(s) (LRB): FLEXIBLE BRONCHOSCOPY (N/A) THORACOTOMY MAJOR (Right) LOBECTOMY (Right)  Total Length of Stay:  LOS: 6 days   Subjective: Still sob with exertion, on 02 walked 100 feet this am  Objective: Vital signs in last 24 hours: Temp:  [98.3 F (36.8 C)-99.5 F (37.5 C)] 99 F (37.2 C) (12/08 0733) Pulse Rate:  [89] 89  (12/08 0800) Cardiac Rhythm:  [-] Normal sinus rhythm (12/08 0800) Resp:  [13-29] 24  (12/08 0848) BP: (78-110)/(42-69) 91/47 mmHg (12/08 0800) SpO2:  [89 %-97 %] 97 % (12/08 0848) Weight:  [114 lb 13.8 oz (52.1 kg)] 114 lb 13.8 oz (52.1 kg) (12/08 0400)  Filed Weights   11/09/12 0600 11/10/12 0400 11/11/12 0400  Weight: 115 lb 4.8 oz (52.3 kg) 115 lb 15.4 oz (52.6 kg) 114 lb 13.8 oz (52.1 kg)    Weight change: -1 lb 1.6 oz (-0.5 kg)   Hemodynamic parameters for last 24 hours:    Intake/Output from previous day: 12/07 0701 - 12/08 0700 In: 2063.5 [P.O.:1230; I.V.:383.5; IV Piggyback:450] Out: 3860 [Urine:3700; Chest Tube:160]  Intake/Output this shift: Total I/O In: 455 [P.O.:400; I.V.:55] Out: 650 [Urine:650]  Current Meds: Scheduled Meds:   . [EXPIRED] acetylcysteine  1 mL Nebulization Q4H  . bisacodyl  10 mg Oral Daily  . calcium carbonate  1 tablet Oral TID WC  . feeding supplement  1 Container Oral TID BM  . fentaNYL   Intravenous Q4H  . guaiFENesin  1,200 mg Oral BID  . levalbuterol  0.63 mg Nebulization Q6H  . nicotine  14 mg Transdermal Daily  . pantoprazole  40 mg Oral BID AC  . piperacillin-tazobactam (ZOSYN)  IV  3.375 g Intravenous Q8H  . polyethylene glycol  17 g Oral Daily  . vancomycin  750 mg Intravenous Q12H  . [DISCONTINUED] levalbuterol  0.63 mg Nebulization Q4H   Continuous  Infusions:   . dextrose 5 % and 0.45% NaCl 20 mL/hr (11/08/12 0609)   PRN Meds:.ALPRAZolam, naloxone, ondansetron (ZOFRAN) IV, ondansetron (ZOFRAN) IV, oxyCODONE-acetaminophen, potassium chloride, senna-docusate, sodium chloride, traMADol  General appearance: alert, cooperative and fatigued Neurologic: intact Heart: regular rate and rhythm, S1, S2 normal, no murmur, click, rub or gallop and normal apical impulse Lungs: diminished breath sounds bibasilar Abdomen: soft, non-tender; bowel sounds normal; no masses,  no organomegaly Extremities: extremities normal, atraumatic, no cyanosis or edema and Homans sign is negative, no sign of DVT Wound: no air leak today  Lab Results: CBC: Basename 11/11/12 0440 11/10/12 0435  WBC 7.9 8.2  HGB 8.9* 8.6*  HCT 25.8* 25.5*  PLT 402* 329   BMET:  Basename 11/11/12 0440 11/09/12 0400  NA 134* 137  K 4.0 3.3*  CL 96 98  CO2 29 29  GLUCOSE 107* 117*  BUN 6 4*  CREATININE 0.55 0.50  CALCIUM 8.9 8.7    PT/INR: No results found for this basename: LABPROT,INR in the last 72 hours Radiology: Dg Chest Port 1 View  11/11/2012  *RADIOLOGY REPORT*  Clinical Data: 56 year old female - postoperative right lobectomy for cavitary lesion.  PORTABLE CHEST -  1 VIEW  Comparison: 11/10/2012 and prior chest radiographs dating back to 2009.  Findings: Right thoracic postoperative changes are again identified. Two separate right thoracostomy tubes and a right IJ central venous catheter with tip overlying the cavoatrial junction again noted. Right hemithorax volume loss is present with airspace opacities within the remaining right lung, slightly improved since the prior study. A small (10%) right apical pneumothorax is unchanged. Mild left basilar opacity is again noted question atelectasis versus developing airspace disease.  IMPRESSION: Improved right lung aeration with continued small right apical pneumothorax (10%).  Mild left basilar opacity again noted which may  represent atelectasis or developing airspace disease/pneumonia.  Stable support apparatus as described.   Original Report Authenticated By: Harmon Pier, M.D.    Dg Chest Port 1 View  11/10/2012  *RADIOLOGY REPORT*  Clinical Data: Evaluate right chest tubes.  PORTABLE CHEST - 1 VIEW  Comparison: 11/09/2012  Findings: Stable position of the right chest drains.  There is stable right apical pneumothorax that roughly measures 15%. Persistent parenchymal densities throughout the right lung.  Left lung is clear with exception of left basilar atelectasis.  Heart and mediastinum are stable.  The jugular central venous catheter is in the lower SVC region.  IMPRESSION: No significant change in the size of the right pneumothorax. Stable patchy densities throughout the right lung.  New patchy densities at the left lung may represent atelectasis.   Original Report Authenticated By: Richarda Overlie, M.D.      Assessment/Plan: S/P Procedure(s) (LRB): FLEXIBLE BRONCHOSCOPY (N/A) THORACOTOMY MAJOR (Right) LOBECTOMY (Right) Mobilize Continue ABX therapy due to preop lung abscess Improved right lung aeration with continued small right apical pneumothorax (10%) Ct to water seal today with no air leak   [name] [name] B 11/11/2012 10:12 AM

## 2012-11-11 NOTE — Progress Notes (Signed)
Dr. Tyrone Sage notified patient's oral temperature at 1600 was up to 101.7. Ordered sputum and blood cultures.

## 2012-11-12 ENCOUNTER — Inpatient Hospital Stay (HOSPITAL_COMMUNITY): Payer: 59

## 2012-11-12 LAB — CBC
HCT: 24.8 % — ABNORMAL LOW (ref 36.0–46.0)
Hemoglobin: 8.4 g/dL — ABNORMAL LOW (ref 12.0–15.0)
MCH: 33.5 pg (ref 26.0–34.0)
MCHC: 33.9 g/dL (ref 30.0–36.0)
MCV: 98.8 fL (ref 78.0–100.0)
Platelets: 450 10*3/uL — ABNORMAL HIGH (ref 150–400)
RBC: 2.51 MIL/uL — ABNORMAL LOW (ref 3.87–5.11)
RDW: 12.7 % (ref 11.5–15.5)
WBC: 7.5 10*3/uL (ref 4.0–10.5)

## 2012-11-12 LAB — BASIC METABOLIC PANEL
BUN: 6 mg/dL (ref 6–23)
CO2: 27 mEq/L (ref 19–32)
Calcium: 8.7 mg/dL (ref 8.4–10.5)
Chloride: 95 mEq/L — ABNORMAL LOW (ref 96–112)
Creatinine, Ser: 0.5 mg/dL (ref 0.50–1.10)
GFR calc Af Amer: >90 mL/min (ref >90)
GFR calc non Af Amer: >90 mL/min (ref >90)
Glucose, Bld: 108 mg/dL — ABNORMAL HIGH (ref 70–99)
Potassium: 3.4 mEq/L — ABNORMAL LOW (ref 3.5–5.1)
Sodium: 133 mEq/L — ABNORMAL LOW (ref 135–145)

## 2012-11-12 MED ORDER — FENTANYL 10 MCG/ML IV SOLN
INTRAVENOUS | Status: DC
  Administered 2012-11-12: 22:00:00 via INTRAVENOUS
  Administered 2012-11-13: 555 ug via INTRAVENOUS
  Administered 2012-11-13: 202 ug via INTRAVENOUS
  Administered 2012-11-13: 180 ug via INTRAVENOUS
  Administered 2012-11-13: 07:00:00 via INTRAVENOUS
  Administered 2012-11-13: 180 ug via INTRAVENOUS
  Administered 2012-11-13: 15:00:00 via INTRAVENOUS
  Administered 2012-11-13: 135 ug via INTRAVENOUS
  Administered 2012-11-14: 150 ug via INTRAVENOUS
  Administered 2012-11-14: 75 ug via INTRAVENOUS
  Administered 2012-11-14: 210 ug via INTRAVENOUS
  Administered 2012-11-14: 11:00:00 via INTRAVENOUS
  Administered 2012-11-14: 105 ug via INTRAVENOUS
  Administered 2012-11-14: 270 ug via INTRAVENOUS
  Administered 2012-11-14: 130.5 ug via INTRAVENOUS
  Administered 2012-11-14: 165 ug via INTRAVENOUS
  Administered 2012-11-14: via INTRAVENOUS
  Administered 2012-11-15: 135 ug via INTRAVENOUS
  Administered 2012-11-15: 90 ug via INTRAVENOUS
  Administered 2012-11-15 (×2): via INTRAVENOUS
  Administered 2012-11-15: 210 ug via INTRAVENOUS
  Administered 2012-11-15: 128.3 ug via INTRAVENOUS
  Administered 2012-11-15: 116.4 ug via INTRAVENOUS
  Administered 2012-11-16: 105 ug via INTRAVENOUS
  Administered 2012-11-16: 22:00:00 via INTRAVENOUS
  Administered 2012-11-16: 104 ug via INTRAVENOUS
  Administered 2012-11-16: 165 ug via INTRAVENOUS
  Administered 2012-11-16: 135 ug via INTRAVENOUS
  Administered 2012-11-16: 225 ug via INTRAVENOUS
  Administered 2012-11-16: 60 ug via INTRAVENOUS
  Administered 2012-11-17: 75 ug via INTRAVENOUS
  Administered 2012-11-17: 105 ug via INTRAVENOUS
  Administered 2012-11-17: 15 ug via INTRAVENOUS
  Administered 2012-11-17: 105 ug via INTRAVENOUS
  Administered 2012-11-17: 120 ug via INTRAVENOUS
  Filled 2012-11-12 (×9): qty 50

## 2012-11-12 MED ORDER — NALOXONE HCL 0.4 MG/ML IJ SOLN: 0.4000 mg | INTRAMUSCULAR | Status: DC | PRN

## 2012-11-12 MED ORDER — POTASSIUM CHLORIDE CRYS ER 20 MEQ PO TBCR
40.0000 meq | EXTENDED_RELEASE_TABLET | Freq: Once | ORAL | Status: AC
  Administered 2012-11-12: 40 meq via ORAL
  Filled 2012-11-12: qty 2

## 2012-11-12 MED ORDER — ONDANSETRON HCL 4 MG/2ML IJ SOLN: 4.0000 mg | Freq: Four times a day (QID) | INTRAMUSCULAR | Status: DC | PRN

## 2012-11-12 MED ORDER — SODIUM CHLORIDE 0.9 % IJ SOLN: 9.0000 mL | INTRAMUSCULAR | Status: DC | PRN

## 2012-11-12 MED ORDER — OXYCODONE HCL 5 MG PO TABS
10.0000 mg | ORAL_TABLET | ORAL | Status: DC | PRN
  Administered 2012-11-12 – 2012-11-18 (×32): 10 mg via ORAL
  Filled 2012-11-12 (×33): qty 2

## 2012-11-12 NOTE — Progress Notes (Signed)
Nutrition Follow-up  Intervention:    Continue Resource Breeze supplement 3 times daily between meals (250 kcals, 9 gm protein per 8 fl oz carton) RD to follow for nutrition care plan  Assessment:   Patient sleeping upon RD visitation.  PO intake variable at 10-50% per flowsheet records.  Resource Breeze supplement ordered per RD 12/3 -- drinking some per RN.  Post-op pneumonia, on ABX.  No BM since surgery.  Diet Order:  Regular  Meds: Scheduled Meds:   . bisacodyl  10 mg Oral Daily  . calcium carbonate  1 tablet Oral TID WC  . feeding supplement  1 Container Oral TID BM  . guaiFENesin  1,200 mg Oral BID  . levalbuterol  0.63 mg Nebulization Q6H  . nicotine  14 mg Transdermal Daily  . pantoprazole  40 mg Oral BID AC  . piperacillin-tazobactam (ZOSYN)  IV  3.375 g Intravenous Q8H  . polyethylene glycol  17 g Oral Daily  . [COMPLETED] potassium chloride  40 mEq Oral Once  . vancomycin  750 mg Intravenous Q8H  . [DISCONTINUED] fentaNYL   Intravenous Q4H  . [DISCONTINUED] vancomycin  750 mg Intravenous Q12H   Continuous Infusions:   . [DISCONTINUED] dextrose 5 % and 0.45% NaCl 20 mL/hr at 11/11/12 1300   PRN Meds:.ALPRAZolam, ondansetron (ZOFRAN) IV, oxyCODONE, potassium chloride, senna-docusate, traMADol, [DISCONTINUED] naloxone, [DISCONTINUED] ondansetron (ZOFRAN) IV, [DISCONTINUED] oxyCODONE-acetaminophen, [DISCONTINUED] sodium chloride   CMP     Component Value Date/Time   NA 133* 11/12/2012 0408   K 3.4* 11/12/2012 0408   CL 95* 11/12/2012 0408   CO2 27 11/12/2012 0408   GLUCOSE 108* 11/12/2012 0408   BUN 6 11/12/2012 0408   CREATININE 0.50 11/12/2012 0408   CALCIUM 8.7 11/12/2012 0408   PROT 6.0 11/07/2012 0431   ALBUMIN 3.0* 11/07/2012 0431   AST 21 11/07/2012 0431   ALT 11 11/07/2012 0431   ALKPHOS 51 11/07/2012 0431   BILITOT 0.3 11/07/2012 0431   GFRNONAA >90 11/12/2012 0408   GFRAA >90 11/12/2012 0408     Intake/Output Summary (Last 24 hours) at 11/12/12 1015 Last  data filed at 11/12/12 0900  Gross per 24 hour  Intake 3185.91 ml  Output   3145 ml  Net  40.91 ml    Weight Status:  52.3 kg (12/9) -- stable  Estimated needs:  1500-1700 kcals, 75-85 gm protein  Nutrition Dx:  Increased Nutrient Needs r/t post-op healing as evidenced by estimated nutrition needs, ongoing  Goal:  Oral intake with meals & supplements to meet >/= 90% of estimated nutrition needs, unmet  Monitor:  PO & supplemental intake, weight, labs, I/O's  Kirkland Hun, RD, LDN Pager #: [phone] After-Hours Pager #: [phone]

## 2012-11-12 NOTE — Progress Notes (Signed)
7 Days Post-Op Procedure(s) (LRB): FLEXIBLE BRONCHOSCOPY (N/A) THORACOTOMY MAJOR (Right) LOBECTOMY (Right) Subjective: No specific complaints. No BM since surgery. Walking 300 ft. X 3 daily.  Objective: Vital signs in last 24 hours: Temp:  [97.9 F (36.6 C)-101.7 F (38.7 C)] 98.8 F (37.1 C) (12/09 0719) Cardiac Rhythm:  [-] Sinus tachycardia (12/09 0800) Resp:  [13-33] 22  (12/09 0800) BP: (83-109)/(43-82) 93/78 mmHg (12/09 0800) SpO2:  [88 %-97 %] 92 % (12/09 0800) Weight:  [52.3 kg (115 lb 4.8 oz)] 52.3 kg (115 lb 4.8 oz) (12/09 0400)  Hemodynamic parameters for last 24 hours:    Intake/Output from previous day: 12/08 0701 - 12/09 0700 In: 3440.4 [P.O.:2300; I.V.:390.4; IV Piggyback:750] Out: 3920 [Urine:3750; Chest Tube:170] Intake/Output this shift: Total I/O In: 190 [P.O.:120; I.V.:20; IV Piggyback:50] Out: -   General appearance: alert and cooperative Heart: regular rate and rhythm, S1, S2 normal, no murmur, click, rub or gallop Lungs: rhonchi right lung. Improving no air leak from chest tubes  Lab Results:  [facility] 11/12/12 0408 11/11/12 0440  WBC 7.5 7.9  HGB 8.4* 8.9*  HCT 24.8* 25.8*  PLT 450* 402*   BMET:  Basename 11/12/12 0408 11/11/12 0440  NA 133* 134*  K 3.4* 4.0  CL 95* 96  CO2 27 29  GLUCOSE 108* 107*  BUN 6 6  CREATININE 0.50 0.55  CALCIUM 8.7 8.9    PT/INR: No results found for this basename: LABPROT,INR in the last 72 hours ABG    Component Value Date/Time   PHART 7.395 11/06/2012 0410   HCO3 25.0* 11/06/2012 0410   TCO2 26 11/06/2012 0410   ACIDBASEDEF 1.2 11/05/2012 0621   O2SAT 94.0 11/06/2012 0410   CBG (last 3)  No results found for this basename: GLUCAP:3 in the last 72 hours  Assessment/Plan: S/P Procedure(s) (LRB): FLEXIBLE BRONCHOSCOPY (N/A) THORACOTOMY MAJOR (Right) LOBECTOMY (Right) Fever spike yesterday. Cultures negative. WBC nl. Will remove central line. Continue vanc and zosyn for right lung pneumonia. Right  lung aeration continues to improve.  Remove anterior chest tube and probably remove the Bard drain tomorrow.  DC PCA and use oral pain meds only.  Transfer to 3300 and continue mobilization, IS.    LOS: 7 days    Rudolfo Brandow K 11/12/2012

## 2012-11-12 NOTE — Progress Notes (Signed)
Pt arrived from 2300, oriented slightly lethargic, febrile. Will continue to monitor.

## 2012-11-12 NOTE — Progress Notes (Signed)
32 ml of fentanyl PCA wasted into sink, witnessed by Larina Bras RN

## 2012-11-12 NOTE — Progress Notes (Signed)
Pt c/o pain 8/10 despite pain medicine. Dr. Dorris Fetch notified, new orders received. Will continue to monitor.

## 2012-11-13 ENCOUNTER — Inpatient Hospital Stay (HOSPITAL_COMMUNITY): Payer: 59

## 2012-11-13 LAB — CBC
HCT: 26.2 % — ABNORMAL LOW (ref 36.0–46.0)
MCH: 33.1 pg (ref 26.0–34.0)
MCV: 98.5 fL (ref 78.0–100.0)
Platelets: 509 10*3/uL — ABNORMAL HIGH (ref 150–400)
RBC: 2.66 MIL/uL — ABNORMAL LOW (ref 3.87–5.11)

## 2012-11-13 LAB — EXPECTORATED SPUTUM ASSESSMENT W GRAM STAIN, RFLX TO RESP C

## 2012-11-13 MED ORDER — LACTULOSE 10 GM/15ML PO SOLN
10.0000 g | Freq: Once | ORAL | Status: AC
  Administered 2012-11-13: 10 g via ORAL
  Filled 2012-11-13: qty 15

## 2012-11-13 MED ORDER — INFLUENZA VIRUS VACC SPLIT PF IM SUSP: 0.5000 mL | INTRAMUSCULAR | Status: DC | PRN

## 2012-11-13 MED ORDER — PNEUMOCOCCAL VAC POLYVALENT 25 MCG/0.5ML IJ INJ: 0.5000 mL | INJECTION | Freq: Once | INTRAMUSCULAR | Status: DC

## 2012-11-13 MED ORDER — INFLUENZA VIRUS VACC SPLIT PF IM SUSP: 0.5000 mL | Freq: Once | INTRAMUSCULAR | Status: DC

## 2012-11-13 NOTE — Progress Notes (Signed)
Pt amy entire unit today; tolerated. Pt had bowel movement this evening. Will continue to monitor.

## 2012-11-13 NOTE — Progress Notes (Addendum)
[address]            [place] [address]          [phone]     8 Days Post-Op Procedure(s) (LRB): FLEXIBLE BRONCHOSCOPY (N/A) THORACOTOMY MAJOR (Right) LOBECTOMY (Right)  Subjective: Tm 102 yesterday afternoon, but none since.  Significant pain overnight requiring restart of PCA. More comfortable this am, but + cough with thick sputum, desats with movement.    Objective: Vital signs in last 24 hours: Patient Vitals for the past 24 hrs:  BP Temp Temp src Pulse Resp SpO2  11/13/12 0738 - - - - - 92 %  11/13/12 0300 105/65 mmHg 98.5 F (36.9 C) Oral 100  27  93 %  11/13/12 0135 - - - - - 97 %  11/12/12 2300 110/64 mmHg 99.4 F (37.4 C) Oral 103  25  99 %  11/12/12 2145 100/56 mmHg - - 105  23  99 %  11/12/12 2143 - - - - 25  97 %  11/12/12 2117 - - - - - 99 %  11/12/12 2000 - 100.7 F (38.2 C) Oral 121  21  91 %  11/12/12 1600 104/63 mmHg - - 109  21  94 %  11/12/12 1537 - 102.1 F (38.9 C) Oral - - -  11/12/12 1500 100/56 mmHg - - 117  29  95 %  11/12/12 1400 102/65 mmHg - - 113  25  94 %  11/12/12 1332 - - - - - 98 %  11/12/12 1300 107/60 mmHg - - 106  18  90 %  11/12/12 1230 - - - 103  21  94 %  11/12/12 1200 103/70 mmHg - - 87  19  94 %  11/12/12 1108 - 99 F (37.2 C) Oral - - -  11/12/12 1100 - - - 89  15  97 %  11/12/12 1000 98/66 mmHg - - 90  20  95 %  11/12/12 0900 100/59 mmHg - - 99  23  91 %  11/12/12 0800 93/78 mmHg - - 94  22  92 %   Current Weight  11/12/12 115 lb 4.8 oz (52.3 kg)     Intake/Output from previous day: 12/09 0701 - 12/10 0700 In: 2100.5 [P.O.:1600; I.V.:50.5; IV Piggyback:450] Out: 3650 [Urine:3600; Chest Tube:50]    PHYSICAL EXAM:  Heart: RRR Lungs: BS decreased on R Wound: Stable Chest tube: no air leak   Lab Results: CBC: Basename 11/13/12 0602 11/12/12 0408  WBC 9.3 7.5  HGB 8.8* 8.4*  HCT 26.2* 24.8*  PLT 509* 450*   BMET:  Basename 11/12/12 0408 11/11/12 0440  NA 133*  134*  K 3.4* 4.0  CL 95* 96  CO2 27 29  GLUCOSE 108* 107*  BUN 6 6  CREATININE 0.50 0.55  CALCIUM 8.7 8.9    PT/INR: No results found for this basename: LABPROT,INR in the last 72 hours  CXR: stable apical pt/space, RLL infiltrate  Assessment/Plan: S/P Procedure(s) (LRB): FLEXIBLE BRONCHOSCOPY (N/A) THORACOTOMY MAJOR (Right) LOBECTOMY (Right)  Fever yesterday, but temp curve has trended down since then.  Sputum, blood cx negative so far.  WBC up slightly from yesterday.  Continue Vanc/Zosyn for R sided pneumonia.  Continue aggressive pulm toilet, mucolytics, nebs.  Still desats with any exertion.  Expected postop blood loss anemia- H/H stable.  CT output 50 ml/24 hrs.  Hopefully can d/c remaining CT this am.   LOS: 8 days    COLLINS,GINA H 11/13/2012    Chart reviewed, patient examined, agree with above. There is still a small air leak from this chest tube so it needs to stay in.  CXR looks about the same today but it is a terrible, rotated film. There is still a small apical space. Right lung sounds much better today.

## 2012-11-14 ENCOUNTER — Inpatient Hospital Stay (HOSPITAL_COMMUNITY): Payer: 59

## 2012-11-14 LAB — CBC
Hemoglobin: 8.3 g/dL — ABNORMAL LOW (ref 12.0–15.0)
MCH: 33.1 pg (ref 26.0–34.0)
MCHC: 33.7 g/dL (ref 30.0–36.0)
RDW: 12.6 % (ref 11.5–15.5)

## 2012-11-14 LAB — BASIC METABOLIC PANEL
BUN: 6 mg/dL (ref 6–23)
Creatinine, Ser: 0.52 mg/dL (ref 0.50–1.10)
GFR calc non Af Amer: >90 mL/min (ref >90)
Glucose, Bld: 147 mg/dL — ABNORMAL HIGH (ref 70–99)
Potassium: 3.3 mEq/L — ABNORMAL LOW (ref 3.5–5.1)

## 2012-11-14 MED ORDER — VANCOMYCIN HCL IN DEXTROSE 1-5 GM/200ML-% IV SOLN
1000.0000 mg | Freq: Three times a day (TID) | INTRAVENOUS | Status: DC
  Administered 2012-11-14 – 2012-11-16 (×6): 1000 mg via INTRAVENOUS
  Filled 2012-11-14 (×8): qty 200

## 2012-11-14 MED ORDER — MAGNESIUM HYDROXIDE 400 MG/5ML PO SUSP
30.0000 mL | Freq: Every day | ORAL | Status: DC | PRN
  Administered 2012-11-14: 30 mL via ORAL
  Filled 2012-11-14: qty 30

## 2012-11-14 MED ORDER — FLUCONAZOLE 100 MG PO TABS
100.0000 mg | ORAL_TABLET | Freq: Every day | ORAL | Status: DC
  Administered 2012-11-14 – 2012-11-19 (×6): 100 mg via ORAL
  Filled 2012-11-14 (×6): qty 1

## 2012-11-14 MED ORDER — BISACODYL 10 MG RE SUPP: 10.0000 mg | Freq: Every day | RECTAL | Status: DC | PRN

## 2012-11-14 MED ORDER — ACETAMINOPHEN 325 MG PO TABS
650.0000 mg | ORAL_TABLET | Freq: Four times a day (QID) | ORAL | Status: DC | PRN
  Administered 2012-11-14 – 2012-11-18 (×2): 650 mg via ORAL
  Filled 2012-11-14 (×2): qty 2

## 2012-11-14 MED ORDER — POTASSIUM CHLORIDE CRYS ER 20 MEQ PO TBCR
40.0000 meq | EXTENDED_RELEASE_TABLET | Freq: Two times a day (BID) | ORAL | Status: DC
  Administered 2012-11-14 – 2012-11-15 (×4): 40 meq via ORAL
  Filled 2012-11-14 (×7): qty 2

## 2012-11-14 MED ORDER — LACTULOSE 10 GM/15ML PO SOLN
20.0000 g | Freq: Once | ORAL | Status: AC
  Administered 2012-11-14: 20 g via ORAL
  Filled 2012-11-14: qty 30

## 2012-11-14 NOTE — Progress Notes (Signed)
ANTIBIOTIC CONSULT NOTE - FOLLOW UP  Pharmacy Consult for Vancomycin and Zosyn Indication: pneumonia  Allergies  Allergen Reactions  . Alendronate Sodium Other (See Comments)    REACTION: chest pain  . Antihistamines, Chlorpheniramine-Type Other (See Comments)    REACTION: "makes lungs bleed"  . Benadryl (Diphenhydramine) Other (See Comments)    REACTION: "makes lungs bleed"  . Nsaids Other (See Comments)    REACTION: ischemic colitis  . Other Other (See Comments)    All anti-inflammatories.  REACTION: ischemic colitis  . Pseudoephedrine Other (See Comments)    REACTION: Ischemic colitis  . Robitussin A-C (Guaifenesin-Codeine) Itching  . Levofloxacin Anxiety    Patient Measurements: Height: 5\' 6"  (167.6 cm) Weight: 115 lb 4.8 oz (52.3 kg) IBW/kg (Calculated) : 59.3   Vital Signs: Temp: 99.2 F (37.3 C) (12/11 0355) Temp src: Oral (12/11 0355) BP: 96/53 mmHg (12/11 0355) Pulse Rate: 103  (12/11 0355) Intake/Output from previous day: 12/10 0701 - 12/11 0700 In: 990 [P.O.:840; IV Piggyback:150] Out: 2700 [Urine:2700] Intake/Output from this shift: Total I/O In: 840 [P.O.:840] Out: 1000 [Urine:1000]  Labs:  [facility] 11/14/12 0435 11/13/12 0602 11/12/12 0408  WBC 9.5 9.3 7.5  HGB 8.3* 8.8* 8.4*  PLT 554* 509* 450*  LABCREA -- -- --  CREATININE 0.52 -- 0.50   Estimated Creatinine Clearance: 66.4 ml/min (by C-G formula based on Cr of 0.52).  Microbiology: Recent Results (from the past 720 hour(s))  SURGICAL PCR SCREEN     Status: Normal   Collection Time   11/02/12 11:07 AM      Component Value Range Status Comment   MRSA, PCR NEGATIVE  NEGATIVE Final    Staphylococcus aureus NEGATIVE  NEGATIVE Final   AFB CULTURE WITH SMEAR     Status: Normal (Preliminary result)   Collection Time   11/05/12 12:12 PM      Component Value Range Status Comment   Specimen Description TISSUE LUNG RIGHT   Final    Special Requests RIGHT UPPER LUNG LOBE PT ON ZINACEF   Final    ACID FAST SMEAR NO ACID FAST BACILLI SEEN   Final    Culture     Final    Value: CULTURE WILL BE EXAMINED FOR 6 WEEKS BEFORE ISSUING A FINAL REPORT   Report Status PENDING   Incomplete   FUNGUS CULTURE W SMEAR     Status: Normal (Preliminary result)   Collection Time   11/05/12 12:12 PM      Component Value Range Status Comment   Specimen Description TISSUE LUNG RIGHT   Final    Special Requests RIGHT UPPER LUNG LOBE PT ON ZINACEF   Final    Fungal Smear NO YEAST OR FUNGAL ELEMENTS SEEN   Final    Culture CULTURE IN PROGRESS FOR FOUR WEEKS   Final    Report Status PENDING   Incomplete   TISSUE CULTURE     Status: Normal   Collection Time   11/05/12 12:12 PM      Component Value Range Status Comment   Specimen Description TISSUE LUNG RIGHT   Final    Special Requests RIGHT UPPER LUNG LOBE PT ON ZINACEF   Final    Gram Stain     Final    Value: RARE WBC PRESENT,BOTH PMN AND MONONUCLEAR     NO ORGANISMS SEEN   Culture NO GROWTH 3 DAYS   Final    Report Status 11/08/2012 FINAL   Final   ANAEROBIC CULTURE  Status: Normal   Collection Time   11/05/12 12:17 PM      Component Value Range Status Comment   Specimen Description TISSUE LUNG RIGHT   Final    Special Requests RIGHT UPPER LUNG LOBE PT ON ZINACEF   Final    Gram Stain     Final    Value: RARE WBC PRESENT,BOTH PMN AND MONONUCLEAR     NO ORGANISMS SEEN   Culture NO ANAEROBES ISOLATED   Final    Report Status 11/10/2012 FINAL   Final   CULTURE, BLOOD (ROUTINE X 2)     Status: Normal (Preliminary result)   Collection Time   11/11/12  6:15 PM      Component Value Range Status Comment   Specimen Description BLOOD RIGHT ARM   Final    Special Requests BOTTLES DRAWN AEROBIC AND ANAEROBIC 10CC EA   Final    Culture  Setup Time 11/12/2012 09:15   Final    Culture     Final    Value:        BLOOD CULTURE RECEIVED NO GROWTH TO DATE CULTURE WILL BE HELD FOR 5 DAYS BEFORE ISSUING A FINAL NEGATIVE REPORT   Report Status PENDING    Incomplete   CULTURE, BLOOD (ROUTINE X 2)     Status: Normal (Preliminary result)   Collection Time   11/11/12  6:21 PM      Component Value Range Status Comment   Specimen Description BLOOD LEFT ARM   Final    Special Requests BOTTLES DRAWN AEROBIC AND ANAEROBIC 10CC EA   Final    Culture  Setup Time 11/12/2012 09:14   Final    Culture     Final    Value:        BLOOD CULTURE RECEIVED NO GROWTH TO DATE CULTURE WILL BE HELD FOR 5 DAYS BEFORE ISSUING A FINAL NEGATIVE REPORT   Report Status PENDING   Incomplete   CULTURE, EXPECTORATED SPUTUM-ASSESSMENT     Status: Normal   Collection Time   11/12/12  9:59 PM      Component Value Range Status Comment   Specimen Description SPUTUM   Final    Special Requests NONE   Final    Sputum evaluation     Final    Value: THIS SPECIMEN IS ACCEPTABLE. RESPIRATORY CULTURE REPORT TO FOLLOW.   Report Status 11/13/2012 FINAL   Final   CULTURE, RESPIRATORY     Status: Normal (Preliminary result)   Collection Time   11/12/12  9:59 PM      Component Value Range Status Comment   Specimen Description SPUTUM   Final    Special Requests ADDED 11/13/12   Final    Gram Stain     Final    Value: RARE WBC PRESENT,BOTH PMN AND MONONUCLEAR     FEW SQUAMOUS EPITHELIAL CELLS PRESENT     MODERATE YEAST   Culture PENDING   Incomplete    Report Status PENDING   Incomplete    Assessment:  56 yo female POD#9 right thoracotomy and right upper lobectomy. On D8 vancomycin and Zosyn for pneumonia. Tmax 101.2, WBC 9.5. Vancomycin trough (10.3 mcg/mL) is below-goal on 750mg  IV Q8H. Previously, vancomycin trough was 5.6 mcg/mL on 750mg  IV Q12H.   Goal of Therapy:  Vancomycin trough level 15-20 mcg/ml appropriate Zoysn dose for renal function and infection  Plan:  1. Increase IV vancomycin to 1gm IV Q8H.  2. Continue Zosyn at 3.375gm IV Q8H.  Emeline Gins 11/14/2012,6:19 AM

## 2012-11-14 NOTE — Progress Notes (Addendum)
[address]            [place] [address]          [phone]     9 Days Post-Op Procedure(s) (LRB): FLEXIBLE BRONCHOSCOPY (N/A) THORACOTOMY MAJOR (Right) LOBECTOMY (Right)  Subjective: Feels a little bit better today.  Less cough this am. Still constipated despite Lactulose yesterday.    Objective: Vital signs in last 24 hours: Patient Vitals for the past 24 hrs:  BP Temp Temp src Pulse Resp SpO2  11/14/12 0742 - - - - 22  98 %  11/14/12 0741 - 98.6 F (37 C) Oral - - -  11/14/12 0355 96/53 mmHg 99.2 F (37.3 C) Oral 103  21  97 %  11/14/12 0353 - - - - 23  94 %  11/14/12 0204 - - - - - 100 %  11/13/12 2359 99/52 mmHg 99.6 F (37.6 C) Oral 104  16  97 %  11/13/12 2000 - 100.9 F (38.3 C) - - - -  11/13/12 1945 - - - - - 100 %  11/13/12 1938 - - - - 22  100 %  11/13/12 1937 102/55 mmHg 101.2 F (38.4 C) Oral 104  22  99 %  11/13/12 1551 109/62 mmHg 100 F (37.8 C) Oral - - -  11/13/12 1414 - 99.9 F (37.7 C) Oral - - 99 %  11/13/12 1158 99/55 mmHg 98.5 F (36.9 C) Oral - - -  11/13/12 0800 95/55 mmHg 99.3 F (37.4 C) Oral - - -   Current Weight  11/12/12 115 lb 4.8 oz (52.3 kg)     Intake/Output from previous day: 12/10 0701 - 12/11 0700 In: 990 [P.O.:840; IV Piggyback:150] Out: 2740 [Urine:2700; Chest Tube:40]    PHYSICAL EXAM:  Heart: RRR Lungs: better air exchange on R, overall clear Wound: Clean and dry Chest tube: no air leak, but cough still weak    Lab Results: CBC: Basename 11/14/12 0435 11/13/12 0602  WBC 9.5 9.3  HGB 8.3* 8.8*  HCT 24.6* 26.2*  PLT 554* 509*   BMET:  Basename 11/14/12 0435 11/12/12 0408  NA 132* 133*  K 3.3* 3.4*  CL 95* 95*  CO2 26 27  GLUCOSE 147* 108*  BUN 6 6  CREATININE 0.52 0.50  CALCIUM 8.7 8.7    PT/INR: No results found for this basename: LABPROT,INR in the last 72 hours  CXR: stable R apical space, R lung opacities  Assessment/Plan: S/P Procedure(s)  (LRB): FLEXIBLE BRONCHOSCOPY (N/A) THORACOTOMY MAJOR (Right) LOBECTOMY (Right) R pneumonia- continue Vanc/Zosyn.  WBC stable.  Had slight temp spike yesterday but improved since.  Continue pulm toilet. Chest tube with no obvious leak this am, CXR stable.  Continue CT to water seal.  Hopefully can d/c soon. Hypokalemia- replace K+. GI- LOC today.   LOS: 9 days    COLLINS,GINA H 11/14/2012    Chart reviewed, patient examined, agree with above. CXR is improving. Right lung airspace opacities resolving. There is a small apical space but no air leak. She is mobilizing secretions. Will keep chest tube in one more day to be sure the air leak is resolved and lung stuck to chest wall. She is at increased risk for developing an infected space. Bowels still not moving so will give more lactulose today. She has some erythema of tongue and roof of mouth that could be Candida.  Will start magic mouthwash.

## 2012-11-14 NOTE — Progress Notes (Signed)
Pt's temp 101.9.  Pt encouraged to use IS, achieved 1250.  Doree Fudge, PA notified.  Orders received, tylenol given.  Roselie Awkward, RN

## 2012-11-14 NOTE — Progress Notes (Signed)
Utilization review completed.  

## 2012-11-15 ENCOUNTER — Inpatient Hospital Stay (HOSPITAL_COMMUNITY): Payer: 59

## 2012-11-15 DIAGNOSIS — Z09 Encounter for follow-up examination after completed treatment for conditions other than malignant neoplasm: Secondary | ICD-10-CM

## 2012-11-15 DIAGNOSIS — J852 Abscess of lung without pneumonia: Secondary | ICD-10-CM

## 2012-11-15 LAB — BASIC METABOLIC PANEL
BUN: 6 mg/dL (ref 6–23)
Calcium: 8.9 mg/dL (ref 8.4–10.5)
GFR calc Af Amer: >90 mL/min (ref >90)
GFR calc non Af Amer: >90 mL/min (ref >90)
Glucose, Bld: 107 mg/dL — ABNORMAL HIGH (ref 70–99)
Sodium: 132 mEq/L — ABNORMAL LOW (ref 135–145)

## 2012-11-15 LAB — CULTURE, RESPIRATORY W GRAM STAIN

## 2012-11-15 LAB — CBC
MCH: 33.7 pg (ref 26.0–34.0)
MCHC: 34.1 g/dL (ref 30.0–36.0)
Platelets: 716 10*3/uL — ABNORMAL HIGH (ref 150–400)
RBC: 2.67 MIL/uL — ABNORMAL LOW (ref 3.87–5.11)

## 2012-11-15 MED ORDER — IOHEXOL 300 MG/ML  SOLN
80.0000 mL | Freq: Once | INTRAMUSCULAR | Status: AC | PRN
  Administered 2012-11-15: 80 mL via INTRAVENOUS

## 2012-11-15 MED ORDER — MOXIFLOXACIN HCL 400 MG PO TABS
400.0000 mg | ORAL_TABLET | Freq: Every day | ORAL | Status: DC
  Administered 2012-11-15 – 2012-11-18 (×4): 400 mg via ORAL
  Filled 2012-11-15 (×5): qty 1

## 2012-11-15 MED ORDER — NON FORMULARY: 400.0000 mg | Freq: Every day | Status: DC

## 2012-11-15 NOTE — Progress Notes (Addendum)
[address]            [place] [address]          [phone]     10 Days Post-Op Procedure(s) (LRB): FLEXIBLE BRONCHOSCOPY (N/A) THORACOTOMY MAJOR (Right) LOBECTOMY (Right)  Subjective: Had a lot of sharp pains in anterior chest last night, but feels better this am.  Still no BM yet, but passing a lot of flatus.   Objective: Vital signs in last 24 hours: Patient Vitals for the past 24 hrs:  BP Temp Temp src Pulse Resp SpO2  11/15/12 0316 96/59 mmHg 100.2 F (37.9 C) Oral 102  37  100 %  11/15/12 0210 - - - - - 96 %  11/14/12 2328 96/64 mmHg 98.4 F (36.9 C) Oral 96  25  100 %  11/14/12 2300 - - - 95  18  96 %  11/14/12 2016 90/61 mmHg 99.3 F (37.4 C) Oral 95  19  100 %  11/14/12 2000 - - - - 20  97 %  11/14/12 1715 - 100.1 F (37.8 C) Oral - - -  11/14/12 1539 - - - - 23  94 %  11/14/12 1530 103/64 mmHg 101.9 F (38.8 C) Oral 110  22  98 %  11/14/12 1351 - - - - - 100 %  11/14/12 1203 - - - - 19  100 %  11/14/12 1200 101/58 mmHg 100 F (37.8 C) Oral 98  20  94 %  11/14/12 0800 98/51 mmHg 98.6 F (37 C) Oral 92  21  99 %   Current Weight  11/12/12 115 lb 4.8 oz (52.3 kg)     Intake/Output from previous day: 12/11 0701 - 12/12 0700 In: 2340 [P.O.:2040; IV Piggyback:300] Out: 4630 [Urine:4550; Chest Tube:80]    PHYSICAL EXAM:  Heart: RRR Lungs: Clear, slightly diminished on R Wound: Clean and dry Chest tube: No air leak, minimal tidaling in chamber    Lab Results: CBC: Basename 11/15/12 0431 11/14/12 0435  WBC 10.3 9.5  HGB 9.0* 8.3*  HCT 26.4* 24.6*  PLT 716* 554*   BMET:  Basename 11/15/12 0431 11/14/12 0435  NA 132* 132*  K 3.9 3.3*  CL 94* 95*  CO2 26 26  GLUCOSE 107* 147*  BUN 6 6  CREATININE 0.55 0.52  CALCIUM 8.9 8.7    PT/INR: No results found for this basename: LABPROT,INR in the last 72 hours  CXR: stable R apical space, pneumonia resolving.    Sputum Cx (12/10) + abundant candida  albicans  Assessment/Plan: S/P Procedure(s) (LRB): FLEXIBLE BRONCHOSCOPY (N/A) THORACOTOMY MAJOR (Right) LOBECTOMY (Right)  ID- WBC up slightly this am, CXR improving steadily. Continues to have mid-afternoon temp spikes.  Cx + candida.  Pt actually started on Diflucan yesterday for oral candidiasis.  Continue Diflucan D#2, Vanc/Zosyn D#8.  Possibly change to po abx soon?  CT remains stable with no significant air leak.  CXR with stable R apical space.  Hopefully can d/c remaining CT today.  GI- Passing flatus but no BM.  She has had a post-op ileus in the past, so will monitor carefully.  Continue daily Miralax, Colace, give LOC prn.  Ambulate, continue pulm toilet.   LOS: 10 days    COLLINS,GINA H 11/15/2012   She is still having daily fever spike in the afternoon. WBC ct is trending up slightly but only 10.3. The  only positive culture result is recent sputum with abundant candida. I started oral diflucan yesterday for possible thrush. She is still coughing up purulent looking sputum with blood streaks. Etiology of her fever is uncertain. It could be persistent pneumonia although right lung looks much better on cxr. She could also develop a localized empyema in the apical space. Drug fever is also a possibility. I consulted Infectious Disease to get an opinion about further antibiotics. I removed the chest tube since it was not draining much, was only serous and there is no air leak.

## 2012-11-15 NOTE — Progress Notes (Signed)
VASCULAR LAB PRELIMINARY  PRELIMINARY  PRELIMINARY  PRELIMINARY  Bilateral lower extremity venous duplex  completed.    Preliminary report:  Bilateral:  No evidence of DVT, superficial thrombosis, or Baker's Cyst.    [name] [name], RVT 11/15/2012, 4:26 PM

## 2012-11-15 NOTE — Consult Note (Signed)
  INFECTIOUS DISEASE ATTENDING ADDENDUM:     [facility]   Date: 11/15/2012  Patient name: [name] [name]  Medical record number: [id number]  Date of birth: 10/07/1956    This patient has been seen and discussed with the house staff. Please see their note for complete details. 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 She has been on very broad spectrum antibiotics for her entire stay including vancomycin and Zosyn. Despite these quite brought spectrum antibiotics she has had persistent fevers. She's had a right apical pneumothorax and persistent right sided double trait but now is been improving on antibiotics.  We're consulted due to the persistence of her fevers. His concern for residual infection in the chest and also for the possibility of other causes of fever including drug fever.  For now I like to repeat imaging of her chest with a CT of the chest to look for empyema or residual infection. In case she is having a beta-lactam induced fever will change her from Zosyn to moxifloxacin. Note moxifloxacin or Avelox is not on formulary at [facility]. The patient however has tolerated this medicine but not tolerated levofloxacin which is our formulary respiratory fluoroquinolone.  Also check Dopplers to screen for DVT  I'll check an HIV antibody and RNA in the morning for screening purposes.  Acey Lav 11/15/2012, 5:42 PM

## 2012-11-15 NOTE — Consult Note (Signed)
[facility]  Total days of antibiotics 9        Day 9 Vancomycin        Day 9 Zosyn        Day 2 Fluconazole       Reason for Consult: Bilateral pneumonia s/p right upper lobectomy and lower lobe superior segmentectomy on 11/05/12 secondary to chronic lung abscess.   Referring Physician: Dr. Laneta Simmers  Active Problems:  Lung abscess    HPI: [name] [name] is a 56 y.o. 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  Patient see Dr. Drue Novel as her PCP who ordered a CXR and subsequent CT chest when she presented to him in January when she presented to him with right-sided chest pain, worse with breathing.  CXR and CT showed a cavitary lesion in the right upper lobe.  Patient was referred to Dr. 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  Patient saw Dr. Laneta Simmers in may who suggested a lobectomy of abscess.  Patient sought second opinion at [facility], who recommended same treatment.  Patient denied surgery due to fear of losing her job.  She continued to have recurrent infections that she states were best treated with avelox.  Patient decided to get sugery, which was done on 11/05/12.  Dr. Laneta Simmers performed a right upper lobectomy and right lower lobe superior segmentectomy.  On 11/07/12, patient began to have low-grade fevers.  A CXR on 11/07/12 showed right-sided opacities. Patient was placed on Vancomycin and Zosyn on 11/07/12. On 12/6, a CXR showed opacities on the left as well.  Since, the pneumonia has stabilized and slightly improved according to CXR.  Patient is still continuing to have fevers and her WBC is trending up, and is 10.3 today.  Patient still complains of hemoptysis, coughing with brownish- gold sputum, right sided chest pain, fevers, and chills.  Past Medical History  Diagnosis Date  . COPD  (chronic obstructive pulmonary disease)     per CT 11/10/05, will minimal bronchiectasis rll  . GERD (gastroesophageal reflux disease)   . Osteoporosis     dexa per gyn  . 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 Headache   . Lichen planus   . B12 deficiency   . Endometriosis   . Cataracts, bilateral   . Arthritis   . Lung mass   . Asthma     .  years ago- has not had problem for years.  . Pneumonia     last ime 2008  . Hemorrhoid   . Pneumothorax, spontaneous, tension     x 2 in her 20's.  has a chest tube for one    Allergies:  Allergies  Allergen Reactions  . Alendronate Sodium Other (See Comments)    REACTION: chest pain  . Antihistamines, Chlorpheniramine-Type Other (See Comments)    REACTION: "makes lungs bleed"  . Benadryl (Diphenhydramine) Other (See Comments)    REACTION: "makes lungs bleed"  . Nsaids Other (See Comments)    REACTION: ischemic colitis  . Other Other (See Comments)    All anti-inflammatories.  REACTION: ischemic colitis  . Pseudoephedrine Other (See Comments)    REACTION: Ischemic colitis  . Robitussin A-C (Guaifenesin-Codeine) Itching  . Levofloxacin Anxiety    Current antibiotics: Vancomycin 11/07/12>>> Zosyn 11/07/12>>>   MEDICATIONS:    .  bisacodyl  10 mg Oral Daily  . calcium carbonate  1 tablet Oral TID WC  . feeding supplement  1 Container Oral TID BM  . fentaNYL   Intravenous Q4H  . fluconazole  100 mg Oral Daily  . guaiFENesin  1,200 mg Oral BID  . levalbuterol  0.63 mg Nebulization Q6H  . pantoprazole  40 mg Oral BID AC  . piperacillin-tazobactam (ZOSYN)  IV  3.375 g Intravenous Q8H  . polyethylene glycol  17 g Oral Daily  . potassium chloride  40 mEq Oral BID  . vancomycin  1,000 mg Intravenous Q8H    History  Substance Use Topics  . Smoking status: Current Every Day Smoker -- 1.0 packs/day for 38 years    Types: Cigarettes  . Smokeless tobacco: Never Used  . Alcohol Use: 4.2 oz/week    3 Cans of beer, 4  Glasses of wine per week     Comment: wine occ    Family History  Problem Relation Age of Onset  . Colon cancer Neg Hx   . Breast cancer Neg Hx   . Diabetes Mother     GM  . Heart attack      ?  Marland Kitchen Heart disease Mother   . Heart disease Maternal Grandfather   . Cancer Father     sarcoma    Review of Systems - General ROS: positive for  - chills and fever ENT ROS: negative Respiratory ROS: positive for - cough, hemoptysis, pleuritic pain, shortness of breath and sputum changes Sputum is brownish-gold Cardiovascular ROS: no chest pain or dyspnea on exertion Gastrointestinal ROS: positive for - constipation Neurological ROS: negative Dermatological ROS: negative   OBJECTIVE: Temp:  [98.4 F (36.9 C)-101.9 F (38.8 C)] 99.8 F (37.7 C) (12/12 0800) Pulse Rate:  [95-110] 102  (12/12 0316) Resp:  [18-37] 21  (12/12 0810) BP: (90-103)/(55-64) 101/55 mmHg (12/12 0800) SpO2:  [94 %-100 %] 94 % (12/12 0817) General appearance: alert, cooperative and no distress Head: Normocephalic, without obvious abnormality, atraumatic Throat: lips, mucosa, and tongue normal; teeth and gums normal Resp: No breath sounds in right upper lobe area, decreased breath sounds at bases bilaterally Chest wall: right sided chest wall tenderness Cardio: regular rate and rhythm, S1, S2 normal, no murmur, click, rub or gallop GI: soft, non-tender; bowel sounds normal; no masses,  no organomegaly Extremities: extremities normal, atraumatic, no cyanosis or edema Skin: Skin color, texture, turgor normal. No rashes or lesions  LABS: Results for orders placed during the hospital encounter of 11/05/12 (from the past 48 hour(s))  VANCOMYCIN, TROUGH     Status: Normal   Collection Time   11/14/12  4:35 AM      Component Value Range Comment   Vancomycin Tr 10.3  10.0 - 20.0 ug/mL   BASIC METABOLIC PANEL     Status: Abnormal   Collection Time   11/14/12  4:35 AM      Component Value Range Comment   Sodium  132 (*) 135 - 145 mEq/L    Potassium 3.3 (*) 3.5 - 5.1 mEq/L    Chloride 95 (*) 96 - 112 mEq/L    CO2 26  19 - 32 mEq/L    Glucose, Bld 147 (*) 70 - 99 mg/dL    BUN 6  6 - 23 mg/dL    Creatinine, Ser 1.61  0.50 - 1.10 mg/dL    Calcium 8.7  8.4 - 09.6 mg/dL    GFR calc non Af Amer >90  >  90 mL/min    GFR calc Af Amer >90  >90 mL/min   CBC     Status: Abnormal   Collection Time   11/14/12  4:35 AM      Component Value Range Comment   WBC 9.5  4.0 - 10.5 K/uL    RBC 2.51 (*) 3.87 - 5.11 MIL/uL    Hemoglobin 8.3 (*) 12.0 - 15.0 g/dL    HCT 16.1 (*) 09.6 - 46.0 %    MCV 98.0  78.0 - 100.0 fL    MCH 33.1  26.0 - 34.0 pg    MCHC 33.7  30.0 - 36.0 g/dL    RDW 04.5  40.9 - 81.1 %    Platelets 554 (*) 150 - 400 K/uL   BASIC METABOLIC PANEL     Status: Abnormal   Collection Time   11/15/12  4:31 AM      Component Value Range Comment   Sodium 132 (*) 135 - 145 mEq/L    Potassium 3.9  3.5 - 5.1 mEq/L    Chloride 94 (*) 96 - 112 mEq/L    CO2 26  19 - 32 mEq/L    Glucose, Bld 107 (*) 70 - 99 mg/dL    BUN 6  6 - 23 mg/dL    Creatinine, Ser 9.14  0.50 - 1.10 mg/dL    Calcium 8.9  8.4 - 78.2 mg/dL    GFR calc non Af Amer >90  >90 mL/min    GFR calc Af Amer >90  >90 mL/min   CBC     Status: Abnormal   Collection Time   11/15/12  4:31 AM      Component Value Range Comment   WBC 10.3  4.0 - 10.5 K/uL    RBC 2.67 (*) 3.87 - 5.11 MIL/uL    Hemoglobin 9.0 (*) 12.0 - 15.0 g/dL    HCT 95.6 (*) 21.3 - 46.0 %    MCV 98.9  78.0 - 100.0 fL    MCH 33.7  26.0 - 34.0 pg    MCHC 34.1  30.0 - 36.0 g/dL    RDW 08.6  57.8 - 46.9 %    Platelets 716 (*) 150 - 400 K/uL     MICRO: Blood 11/11/12: NGTD  Sputum 11/12/12: Candida Albicans  IMAGING: Dg Chest 2 View  11/15/2012  *RADIOLOGY REPORT*  Clinical Data: Shortness of breath, follow up pneumonia  CHEST - 2 VIEW  Comparison: Portable chest x-ray of 11/14/2012 and 11/13/2012  Findings: Aeration has improved slightly.  There is still right apical  pneumothorax present with right chest tube unchanged in position.  Right perihilar opacity remains although opacity noted previously at the right lung base has improved.  The heart is within normal limits in size.  IMPRESSION:  1.  Stable right apical pneumothorax with right chest tube remaining. 2.  Some improvement in opacity at the right lung base with haziness overlying the right hilum remaining.   Original Report Authenticated By: Dwyane Dee, M.D.    Dg Chest Port 1 View  11/14/2012  *RADIOLOGY REPORT*  Clinical Data: Right lower lobe pneumonia.  Status post VATS.  PORTABLE CHEST - 1 VIEW  Comparison: 11/13/2012  Findings: 0616 hours right chest tube remains in place.  The apical component of the pneumothorax is still visible and not substantially changed in the interval.  Some mild bibasilar atelectasis or infiltrate persist.  No overt pulmonary edema. Heart size is stable. Telemetry leads overlie the chest.  IMPRESSION:  Stable exam.   Original Report Authenticated By: Kennith Center, M.D.     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  Patient now has post-op fevers and consolidation on CXR.  1) Post-op fevers:  Fevers are likely due to pneumonia.  We added moxifloxacin and discontinued zosyn.  Patient needs CT Chest to make sure there is no residual infection from abscess.  Also ordered bilateral lower extremity duplex scans to rule out DVTs as cause of fevers. Will check HIV and Hepatitis panel.

## 2012-11-16 ENCOUNTER — Inpatient Hospital Stay (HOSPITAL_COMMUNITY): Payer: 59

## 2012-11-16 LAB — HEPATITIS PANEL, ACUTE
HCV Ab: NEGATIVE
Hep A IgM: NEGATIVE
Hepatitis B Surface Ag: NEGATIVE

## 2012-11-16 LAB — CBC
Platelets: 721 10*3/uL — ABNORMAL HIGH (ref 150–400)
RDW: 12.7 % (ref 11.5–15.5)
WBC: 10.1 10*3/uL (ref 4.0–10.5)

## 2012-11-16 MED ORDER — FERROUS GLUCONATE 324 (38 FE) MG PO TABS
324.0000 mg | ORAL_TABLET | Freq: Every day | ORAL | Status: DC
  Administered 2012-11-16 – 2012-11-19 (×4): 324 mg via ORAL
  Filled 2012-11-16 (×5): qty 1

## 2012-11-16 MED ORDER — POTASSIUM CHLORIDE CRYS ER 20 MEQ PO TBCR
40.0000 meq | EXTENDED_RELEASE_TABLET | Freq: Every day | ORAL | Status: DC
  Administered 2012-11-16 – 2012-11-19 (×4): 40 meq via ORAL
  Filled 2012-11-16 (×3): qty 2

## 2012-11-16 NOTE — Progress Notes (Signed)
INFECTIOUS DISEASE ATTENDING ADDENDUM:     [facility]   Date: 11/16/2012  Patient name: [name] [name]  Medical record number: [id number]  Date of birth: 06-29-1956    This patient has been seen and discussed with the house staff. Please see their note for complete details. I concur with their findings with the following additions/corrections:  Patient doing well and afebrile. CT scan without any obvious evidence of residual infection and pt has had more than 10 days of postoperative antibiotics.  I am dcing the vancomycin  I would give her avelox for 4 more days and then stop it as there is no longer any clear cut pneumonia and pt has already received more than enough antibiotics for HCAP duration.  Only other odd thing would be if this turned out to have been TB on AFB cultures in which case the pt may bet worse after stopping the avelox  I would like her to followup in our clinic in 5 weeks time to review ALL of her cultures and see how she is doing.  I will sign off for now. Please call with further questions.   Dr. Luciana Axe is covering this weekend.  Paulette Blanch Dam 11/16/2012, 11:23 AM

## 2012-11-16 NOTE — Progress Notes (Signed)
11 Days Post-Op Procedure(s) (LRB): FLEXIBLE BRONCHOSCOPY (N/A) THORACOTOMY MAJOR (Right) LOBECTOMY (Right) Subjective: Short of breath with ambulation. Had small BM.  Objective: Vital signs in last 24 hours: Temp:  [98.4 F (36.9 C)-98.9 F (37.2 C)] 98.8 F (37.1 C) (12/13 0415) Pulse Rate:  [98-115] 98  (12/13 0415) Cardiac Rhythm:  [-] Normal sinus rhythm (12/13 0415) Resp:  [17-35] 18  (12/13 0748) BP: (92-118)/(54-71) 92/56 mmHg (12/13 0415) SpO2:  [94 %-99 %] 98 % (12/13 0415)  Hemodynamic parameters for last 24 hours:    Intake/Output from previous day: 12/12 0701 - 12/13 0700 In: 3808.6 [P.O.:2880; I.V.:278.6; IV Piggyback:650] Out: 1975 [Urine:1975] Intake/Output this shift:    General appearance: alert, cooperative and looks tired Heart: regular rate and rhythm, S1, S2 normal, no murmur, click, rub or gallop Lungs: clear to auscultation bilaterally Abdomen: soft, non-tender; bowel sounds normal; no masses,  no organomegaly Extremities: extremities normal, atraumatic, no cyanosis or edema Wound: incision ok.  Lab Results:  [facility] 11/16/12 0305 11/15/12 0431  WBC 10.1 10.3  HGB 8.3* 9.0*  HCT 24.4* 26.4*  PLT 721* 716*   BMET:  Basename 11/15/12 0431 11/14/12 0435  NA 132* 132*  K 3.9 3.3*  CL 94* 95*  CO2 26 26  GLUCOSE 107* 147*  BUN 6 6  CREATININE 0.55 0.52  CALCIUM 8.9 8.7    PT/INR: No results found for this basename: LABPROT,INR in the last 72 hours ABG    Component Value Date/Time   PHART 7.395 11/06/2012 0410   HCO3 25.0* 11/06/2012 0410   TCO2 26 11/06/2012 0410   ACIDBASEDEF 1.2 11/05/2012 0621   O2SAT 94.0 11/06/2012 0410   CBG (last 3)  No results found for this basename: GLUCAP:3 in the last 72 hours  Assessment/Plan: S/P Procedure(s) (LRB): FLEXIBLE BRONCHOSCOPY (N/A) THORACOTOMY MAJOR (Right) LOBECTOMY (Right) Continue ambulation, IS Postop pneumonia in residual right lung: Continues to improve on cxr and clinically with  minimal sputum now and clear lung exam. No clear pathogen. Only positive culture has been candida in sputum.  Postop fever of undetermined origin: No fever in the past 24 hrs and wbc remains normal. Appreciate ID evaluation. I think follow up CT of chest yesterday looks ok. There is still some air space disease in middle lobe and 2 small spaces which is not unusual. There is no sign of infection in these spaces so far. ID is following.  She had significant COPD, active smoking, and nutritional depletion/debilitation preop due to chronic illness even though she was going to work every day. I would expect her to have some shortness of breath with exertion and oxygen dependency for a while but this will get better.    LOS: 11 days    Korrine Sicard K 11/16/2012

## 2012-11-16 NOTE — Progress Notes (Signed)
[facility]    Date of Admission:  11/05/2012   Total days of antibiotics 10        (9 days of Zosyn)        Day 10 Vancomycin        Day 2 Moxifloxacin        Day 3 fluconazole   ID: [name] [name] is a 56 y.o. female with a chronic lung abscess who is s/p right upper lobectomy and right lower lobe superior segmentectomy on 11/05/12.  Active Problems:  Lung abscess    Subjective: Patient is afebrile.  Says she feels much better than yesterday and her breathing has improved.  Patient is still have right-sided chest wall pain that she rates a 3/10 on the pain scale.  Medications:     . bisacodyl  10 mg Oral Daily  . calcium carbonate  1 tablet Oral TID WC  . feeding supplement  1 Container Oral TID BM  . fentaNYL   Intravenous Q4H  . ferrous gluconate  324 mg Oral Q breakfast  . fluconazole  100 mg Oral Daily  . guaiFENesin  1,200 mg Oral BID  . levalbuterol  0.63 mg Nebulization Q6H  . moxifloxacin  400 mg Oral Q2000  . pantoprazole  40 mg Oral BID AC  . polyethylene glycol  17 g Oral Daily  . potassium chloride  40 mEq Oral Daily  . vancomycin  1,000 mg Intravenous Q8H    Objective: Vital signs in last 24 hours: Temp:  [98.4 F (36.9 C)-98.9 F (37.2 C)] 98.5 F (36.9 C) (12/13 0800) Pulse Rate:  [98-115] 99  (12/13 0800) Resp:  [17-35] 18  (12/13 0748) BP: (92-118)/(54-71) 101/56 mmHg (12/13 0800) SpO2:  [94 %-99 %] 98 % (12/13 0415)   General appearance: alert, cooperative and no distress Head: Normocephalic, without obvious abnormality, atraumatic Throat: lips, mucosa, and tongue normal; teeth and gums normal Resp: clear to auscultation bilaterally and no breath sounds over right upper lobe area Chest wall: right sided chest wall tenderness Cardio: regular rate and rhythm, S1, S2 normal, no murmur, click, rub or gallop GI: soft, non-tender; bowel sounds normal; no masses,  no organomegaly Extremities: extremities normal,  atraumatic, no cyanosis or edema Skin: Skin color, texture, turgor normal. No rashes or lesions  Lab Results  Basename 11/16/12 0305 11/15/12 0431 11/14/12 0435  WBC 10.1 10.3 --  HGB 8.3* 9.0* --  HCT 24.4* 26.4* --  NA -- 132* 132*  K -- 3.9 3.3*  CL -- 94* 95*  CO2 -- 26 26  BUN -- 6 6  CREATININE -- 0.55 0.52  GLU -- -- --   Liver Panel No results found for this basename: PROT:2,ALBUMIN:2,AST:2,ALT:2,ALKPHOS:2,BILITOT:2,BILIDIR:2,IBILI:2 in the last 72 hours Sedimentation Rate  Basename 11/16/12 0305  ESRSEDRATE 138*   Microbiology: Blood 11/11/12: NGTD   Sputum 11/12/12: Candida Albicans  Right lung tissue 11/05/12: No growth final  Studies/Results: Dg Chest 2 View  11/16/2012  *RADIOLOGY REPORT*  Clinical Data: Pneumonia  CHEST - 2 VIEW  Comparison: Yesterday  Findings: Right chest tube has been removed.  Stable right hydropneumothorax.  Opacities within the right lower lung zone and right suprahilar density are stable.  Interstitial prominence throughout the left lung with hyperaeration is stable.  No left pneumothorax.  Normal heart size.  IMPRESSION: Stable right hydropneumothorax after chest tube removal.   Original Report Authenticated By: Jolaine Click, M.D.    Dg Chest 2 View  11/15/2012  *  RADIOLOGY REPORT*  Clinical Data: Shortness of breath, follow up pneumonia  CHEST - 2 VIEW  Comparison: Portable chest x-ray of 11/14/2012 and 11/13/2012  Findings: Aeration has improved slightly.  There is still right apical pneumothorax present with right chest tube unchanged in position.  Right perihilar opacity remains although opacity noted previously at the right lung base has improved.  The heart is within normal limits in size.  IMPRESSION:  1.  Stable right apical pneumothorax with right chest tube remaining. 2.  Some improvement in opacity at the right lung base with haziness overlying the right hilum remaining.   Original Report Authenticated By: Dwyane Dee, M.D.    Ct  Chest W Contrast  11/15/2012  *RADIOLOGY REPORT*  Clinical Data: Fever and right chest pain.  Status post right lobectomy.  Smoker.  CT CHEST WITH CONTRAST  Technique:  Multidetector CT imaging of the chest was performed following the standard protocol during bolus administration of intravenous contrast.  Contrast: 80mL OMNIPAQUE IOHEXOL 300 MG/ML  SOLN  Comparison: Chestradiographs obtained earlier today and chest CT dated 08/15/2012.  Findings: Interval resection of the cavitary mass in the right upper lobe with multiple surgical staple lines and surgical clips. Approximately 20% right hydropneumothorax.  A portion of this is located at the apex and another portion is posteromedially located and appears partially loculated.  No fluid collections with peripheral rim enhancement are seen.  There is minimal atelectasis at both lung bases.  Bullous changes in the left lung are again demonstrated, remaining most pronounced at the medial aspect of the left upper lobe.  No lung masses or enlarged lymph nodes are seen.  A 1 cm cyst in the right lobe of the liver is unchanged.  Mild thoracic spine degenerative changes.  IMPRESSION:  1.  Right postoperative changes with an approximately 20% right hydropneumothorax. 2.  No abscess or evidence of lung empyema. 3.  Minimal bibasilar atelectasis. 4.  Stable changes of COPD in the left lung.   Original Report Authenticated By: Beckie Salts, M.D.      Assessment/Plan: [name] [name] is a 56 yo female with a past medical history asthma, COPD, GERD, ischemic colitis, hepatic cyst, osteoarthritis, osteoporosis, and insomnia who has had a chronic lung abscess and is now s/p right upper lobectomy and right lower lobe superior segmentectomy on 11/05/12.  Patient has had persistent fevers since surgery.  1) Post-op fevers:  Patient has been afebrile since we discontinued zosyn yesterday, 11/15/12.  Duplex scan of lower extremities is negative and CT chest shows only a  hydropneumothorax and bibasilar atalectasis.  This fever seems to be a drug fever from Zosyn. Will continue to monitor for fevers and signs of infection.  Continue on vancomycin and moxifloxacin.   [name] [facility] Cell: [phone] Pager: [phone]  11/16/2012, 9:15 AM

## 2012-11-16 NOTE — Progress Notes (Addendum)
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   Hancock, Melissa A RN    Witnessed waste of Fentanyl IV in sharps.  Windell Moment RN

## 2012-11-17 NOTE — Progress Notes (Addendum)
12 Days Post-Op Procedure(s) (LRB): FLEXIBLE BRONCHOSCOPY (N/A) THORACOTOMY MAJOR (Right) LOBECTOMY (Right) Subjective:  [name] [name] states she is doing okay this morning.  She continues to have shortness of breath and continued pain.  She remains on a PCA which she continues to use throughout the day.  Patient is requesting a shower.    Objective: Vital signs in last 24 hours: Temp:  [98.5 F (36.9 C)-100.5 F (38.1 C)] 99.9 F (37.7 C) (12/14 0800) Pulse Rate:  [93-109] 98  (12/14 0343) Cardiac Rhythm:  [-] Normal sinus rhythm (12/14 0343) Resp:  [17-25] 18  (12/14 0800) BP: (92-105)/(51-63) 92/55 mmHg (12/14 0343) SpO2:  [87 %-100 %] 100 % (12/14 0800) Intake/Output from previous day: 12/13 0701 - 12/14 0700 In: 1920 [P.O.:1920] Out: 1456 [Urine:1455; Stool:1]  General appearance: alert, cooperative and no distress Heart: regular rate and rhythm Lungs: clear to auscultation bilaterally Abdomen: soft, non-tender; bowel sounds normal; no masses,  no organomegaly Wound: clean and dyr  Lab Results:  [facility] 11/16/12 0305 11/15/12 0431  WBC 10.1 10.3  HGB 8.3* 9.0*  HCT 24.4* 26.4*  PLT 721* 716*   BMET:  Basename 11/15/12 0431  NA 132*  K 3.9  CL 94*  CO2 26  GLUCOSE 107*  BUN 6  CREATININE 0.55  CALCIUM 8.9    PT/INR: No results found for this basename: LABPROT,INR in the last 72 hours ABG    Component Value Date/Time   PHART 7.395 11/06/2012 0410   HCO3 25.0* 11/06/2012 0410   TCO2 26 11/06/2012 0410   ACIDBASEDEF 1.2 11/05/2012 0621   O2SAT 94.0 11/06/2012 0410   CBG (last 3)  No results found for this basename: GLUCAP:3 in the last 72 hours  Assessment/Plan: S/P Procedure(s) (LRB): FLEXIBLE BRONCHOSCOPY (N/A) THORACOTOMY MAJOR (Right) LOBECTOMY (Right)  1. Post operative pneumonia- no clear source identified, sputum with + Candida 2. ID- continue Avelox for 4 more days per ID Recs 3. Pulm- remains on oxygen, will most likely require at discharge  but will continue to attempt to wean 4. Dispo- patient is stable likely ready for d/c soon, however continues to use PCA will need to wean off IV pain medication and get patient comfortable on oral regimen prior to discharge   LOS: 12 days    BARRETT, ERIN 11/17/2012   I have seen and examined the patient and agree with the assessment and plan as outlined.  No fevers last 24 hours.  [name],[name] 11/17/2012 10:52 AM

## 2012-11-17 NOTE — Progress Notes (Signed)
Pt transferred to 2035, per MD order. Report called to receiving nurse and all questions answered at this time.

## 2012-11-18 DIAGNOSIS — Z9889 Other specified postprocedural states: Secondary | ICD-10-CM

## 2012-11-18 DIAGNOSIS — Z902 Acquired absence of lung [part of]: Secondary | ICD-10-CM

## 2012-11-18 DIAGNOSIS — J189 Pneumonia, unspecified organism: Secondary | ICD-10-CM

## 2012-11-18 LAB — CULTURE, BLOOD (ROUTINE X 2)
Culture: NO GROWTH
Culture: NO GROWTH

## 2012-11-18 MED ORDER — FLUCONAZOLE 100 MG PO TABS: 100.0000 mg | ORAL_TABLET | Freq: Every day | ORAL | Status: DC

## 2012-11-18 MED ORDER — OXYCODONE HCL 5 MG PO TABS
15.0000 mg | ORAL_TABLET | ORAL | Status: DC | PRN
  Administered 2012-11-18 – 2012-11-19 (×6): 15 mg via ORAL
  Filled 2012-11-18 (×6): qty 3

## 2012-11-18 MED ORDER — FERROUS GLUCONATE 324 (38 FE) MG PO TABS: 324.0000 mg | ORAL_TABLET | Freq: Every day | ORAL | Status: DC

## 2012-11-18 MED ORDER — OXYCODONE HCL 10 MG PO TABS: 10.0000 mg | ORAL_TABLET | ORAL | Status: DC | PRN

## 2012-11-18 MED ORDER — TRAMADOL HCL 50 MG PO TABS: 50.0000 mg | ORAL_TABLET | Freq: Four times a day (QID) | ORAL | Status: DC | PRN

## 2012-11-18 MED ORDER — LEVALBUTEROL HCL 0.63 MG/3ML IN NEBU
0.6300 mg | INHALATION_SOLUTION | RESPIRATORY_TRACT | Status: DC | PRN
  Filled 2012-11-18: qty 3

## 2012-11-18 NOTE — Progress Notes (Addendum)
13 Days Post-Op Procedure(s) (LRB): FLEXIBLE BRONCHOSCOPY (N/A) THORACOTOMY MAJOR (Right) LOBECTOMY (Right) Subjective:  [name] [name] continues to feel better.  She had low grade fever yesterday and this morning.  Her PCA was discontinued yesterday afternoon per patient requests due to continued beeping. + BM  Objective: Vital signs in last 24 hours: Temp:  [99 F (37.2 C)-100.4 F (38 C)] 99.5 F (37.5 C) (12/15 0603) Pulse Rate:  [77-102] 93  (12/15 0603) Cardiac Rhythm:  [-] Sinus tachycardia (12/14 1456) Resp:  [16-27] 18  (12/15 0603) BP: (94-103)/(47-63) 94/47 mmHg (12/15 0603) SpO2:  [96 %-99 %] 98 % (12/15 0603)   Intake/Output from previous day: 12/14 0701 - 12/15 0700 In: 480 [P.O.:480] Out: -   General appearance: alert, cooperative and no distress Heart: regular rate and rhythm Lungs: clear to auscultation bilaterally Abdomen: soft, non-tender; bowel sounds normal; no masses,  no organomegaly Wound: clean anddry  Lab Results:  Basename 11/16/12 0305  WBC 10.1  HGB 8.3*  HCT 24.4*  PLT 721*   BMET: No results found for this basename: NA:2,K:2,CL:2,CO2:2,GLUCOSE:2,BUN:2,CREATININE:2,CALCIUM:2 in the last 72 hours  PT/INR: No results found for this basename: LABPROT,INR in the last 72 hours ABG    Component Value Date/Time   PHART 7.395 11/06/2012 0410   HCO3 25.0* 11/06/2012 0410   TCO2 26 11/06/2012 0410   ACIDBASEDEF 1.2 11/05/2012 0621   O2SAT 94.0 11/06/2012 0410   CBG (last 3)  No results found for this basename: GLUCAP:3 in the last 72 hours  Assessment/Plan: S/P Procedure(s) (LRB): FLEXIBLE BRONCHOSCOPY (N/A) THORACOTOMY MAJOR (Right) LOBECTOMY (Right)  1. Post Operative Pneumonia- no clear source identified, sputum with + Candida 2. ID- continue Avelox, today is 3/4 3. Pain- PCA turned off yesterday, patient pain under control with use of oral medication 4. Pulm- remains on oxygen, will likely require home use 5. Dispo- patient pain under  control with oral regimen, will require home oxygen, possibly consider d/c in next 24-48 hours?   LOS: 13 days    [name] [name] 11/18/2012   I have seen and examined the patient and agree with the assessment and plan as outlined.  Tentatively for d/c home in am.  [name] [name] 11/18/2012 11:12 AM

## 2012-11-18 NOTE — Discharge Summary (Signed)
Physician Discharge Summary  Patient ID: MERSADIES PETREE MRN: [id number] DOB/AGE: 1957 54 y.o.  Admit date: 11/05/2012 Discharge date: 11/18/2012  Admission Diagnoses:  Patient Active Problem List  Diagnosis  . B12 DEFICIENCY  . CIGARETTE SMOKER  . ASTHMA  . BRONCHIECTASIS  . COPD  . GERD  . ISCHEMIC COLITIS  . HEPATIC CYST  . ENDOMETRIOSIS NOS  . LICHEN PLANUS  . OSTEOARTHRITIS  . OSTEOPOROSIS  . PALPITATIONS  . HEADACHES, HX OF  . INSOMNIA-SLEEP DISORDER-UNSPEC  . Annual physical exam  . Weight loss  . Chest wall pain  . Lung mass  . Lung abscess   Discharge Diagnoses:   Patient Active Problem List  Diagnosis  . B12 DEFICIENCY  . CIGARETTE SMOKER  . ASTHMA  . BRONCHIECTASIS  . COPD  . GERD  . ISCHEMIC COLITIS  . HEPATIC CYST  . ENDOMETRIOSIS NOS  . LICHEN PLANUS  . OSTEOARTHRITIS  . OSTEOPOROSIS  . PALPITATIONS  . HEADACHES, HX OF  . INSOMNIA-SLEEP DISORDER-UNSPEC  . Annual physical exam  . Weight loss  . Chest wall pain  . Lung mass  . Lung abscess  . S/P thoracotomy  . S/P lobectomy of lung  . Pneumonia   Discharged Condition: good  History of present Illness:   Ms. Menzel is a 56 yo white female with significant smoking history who is known to Dr. Laneta Simmers.  She was found to have a right upper lobe cavitary lung Mass.  PET CT scan showed hypermetabolic uptake within the right upper lobe mass and some of the right paratracheal lymph nodes.  She underwent Mediastinoscopy back in June which did not show evidence of malignancy.  At that time it was recommended the patient undergo possible Lobectomy.  The patient declined at that time stating she did not have enough FMLA to be off of work.  The patient has been routinely followed since that time.  She has recurrent infections as well as hemoptysis.  Her latest infection she was placed on Augmentin by Dr. Drue Novel and he referred the patient to Dr. Laneta Simmers.  She had repeat CT scan performed which actually  showed a decrease in the size and wall thickness of the right upper lobe mass.  She also states that since being on an antibiotic her fever has decreased and she is no longer experiencing frank hemoptysis.  The patient did admit to some new right shoulder and chest pain since the most recent episodes of hemoptysis.  The patient went to [facility] for a second opinion and she was again told that she should undergo Thoracotomy with possible lobectomy.  The patient was agreeable to proceed with surgery, but wished for it to be performed in [place].  She was last evaluated by Dr. Laneta Simmers on 10/16/2012 at which time risks and benefits of the procedure were explained to the patient.  She was agreeable to proceed and surgery was scheduled for 11/05/2012.  Hospital Course:   Ms. Hoadley presented to the hospital on 11/05/2012.  She was taken to the operating room and underwent Flexible Bronchoscopy, Thoracotomy with Right Upper Lobectomy, and Lower Lobe Superior Segmentectomy.  She tolerated the procedure well, was extubated and taken to the SICU in stable condition.  Post operative course was complicated by worsening lung collapse and air space disease.  The patient became febrile and was started on Vanc/Zosyn for pneumonia coverage.  The patient continued to have infectious looking sputum production and high fever.  Therefore, Infectious disease was consulted.  No  source of pneumonia was identified.  Cultures taken in the operating room were negative and sputum culture was positive for Candida.  Patient was treated with Diflucan with evidence of thrush in mouth. The patients chest tubes were removed once air leak resolved and follow up chest xray is stable with no evidence of significant pneumothorax.  The patient has been doing better.  She has completed a course of Vanc, Zosyn, Avelox and will remain on Diflucan for several more days.  She has been weaned off her PCA and is tolerating an oral pain medication regimen.  She  remains on oxygen and we will require home use.  We anticipate successful weaning in the near future.  Surgical pathology was negative for malignancy and the report is posted below.  The patient will need to follow up with Dr. Laneta Simmers in 2 weeks with a chest xray.  She was given instructions to call our office immediately should she develop worsening sputum production or redevelopment of fever.  Should she remain stable we anticipate discharge in the next 24-48 hours.            Consults: ID  Significant Diagnostic Studies: Surgical Pathology  Lung, resection (segmental or lobe), Right upper lobe - BENIGN LUNG PARENCHYMA WITH MULTIPLE HEMORRHAGIC ABSCESSES WITH GRANULOMAS AND NECROSIS. - ONE BENIGN HILAR LYMPH NODE WITH ANTHRACOSIS AND GRANULOMAS. - THERE IS NO EVIDENCE OF MALIGNANCY. - SEE COMMENT. Microscopic Comment AFB, PAS, and GMS stains performed on two blocks (Block 1B and Block 1D) fail to highlight the presence of microorganisms. However, the spectrum of findings are highly suggestive of an infectious process. Please also see the patient's concurrent microbiology specimens.  Treatments: surgery:   Flexible fiberoptic bronchoscopy, right  thoracotomy with right upper lobectomy and lower lobe superior  segmentectomy, insertion of On-Q pain pump  Disposition: 01-Home or Self Care  Discharge Orders    Future Appointments: Provider: Department: Dept Phone: Center:   12/19/2012 2:00 PM Randall Hiss, MD [facility] [phone] [facility]       Medication List     As of 11/18/2012 10:16 AM    STOP taking these medications         HYDROcodone-acetaminophen 10-650 MG per tablet   Commonly known as: LORCET      vitamin B-12 100 MCG tablet   Commonly known as: CYANOCOBALAMIN      TAKE these medications         ALPRAZolam 0.5 MG tablet   Commonly known as: XANAX   Take 0.25-0.5 mg by mouth at bedtime as needed. For insomnia.       calcium carbonate 500 MG chewable tablet   Commonly known as: TUMS - dosed in mg elemental calcium   Chew 1 tablet by mouth daily.      ferrous gluconate 324 MG tablet   Commonly known as: FERGON   Take 1 tablet (324 mg total) by mouth daily with breakfast.      fluconazole 100 MG tablet   Commonly known as: DIFLUCAN   Take 1 tablet (100 mg total) by mouth daily.      MUCINEX PO   Take 1 tablet by mouth daily.      NICORETTE 4 MG lozenge   Generic drug: nicotine polacrilex   Place 4 mg inside cheek every 3 (three) hours as needed. For nicotine cravings.      nicotine 10 MG inhaler   Commonly known as: NICOTROL   Inhale 1  puff into the lungs every hour as needed. For nicotine cravings.      NONFORMULARY OR COMPOUNDED ITEM   Take 1 tablet by mouth daily. Phytomega (cholesterol supplement)      omeprazole 20 MG capsule   Commonly known as: PRILOSEC   Take 20 mg by mouth daily.      Oxycodone HCl 10 MG Tabs   Take 1 tablet (10 mg total) by mouth every 3 (three) hours as needed.      traMADol 50 MG tablet   Commonly known as: ULTRAM   Take 1-2 tablets (50-100 mg total) by mouth every 6 (six) hours as needed.         SignedLowella Dandy 11/18/2012, 10:16 AM

## 2012-11-18 NOTE — Progress Notes (Signed)
Wasted 90 mcg of Fentanyl in the sharps container. Witnessed by Clemencia Course, RN.

## 2012-11-18 NOTE — Progress Notes (Signed)
Pt amb 700 ft on RA.  O2 sat at rest was 89-90%. With walking O2 sats rose to mid 90's and would decline to 88-89% when pt talks. Stopped to rest once. Once back in room O2 was 90%.  Will continue to monitor pt.

## 2012-11-19 MED ORDER — MOXIFLOXACIN HCL 400 MG PO TABS: 400.0000 mg | ORAL_TABLET | Freq: Every day | ORAL | Status: DC

## 2012-11-19 NOTE — Progress Notes (Addendum)
                    [address]            [place] [address]          [phone]     14 Days Post-Op Procedure(s) (LRB): FLEXIBLE BRONCHOSCOPY (N/A) THORACOTOMY MAJOR (Right) LOBECTOMY (Right)  Subjective: Feels much better.  Breathing stable, less cough.    Objective: Vital signs in last 24 hours: Patient Vitals for the past 24 hrs:  BP Temp Temp src Pulse Resp SpO2  11/19/12 0438 101/58 mmHg 99.7 F (37.6 C) Oral 104  20  92 %  11/19/12 0152 - - - - - 94 %  11/18/12 2214 - - - - - 96 %  11/18/12 2041 99/64 mmHg 98 F (36.7 C) Oral 88  20  93 %  11/18/12 1656 108/62 mmHg - - - - -  11/18/12 1523 100/51 mmHg 100 F (37.8 C) Oral 100  18  100 %  11/18/12 1447 - - - - - 98 %  11/18/12 0953 - - - - - 97 %   Current Weight  11/12/12 115 lb 4.8 oz (52.3 kg)     Intake/Output from previous day: 12/15 0701 - 12/16 0700 In: 720 [P.O.:720] Out: 200 [Urine:200]    PHYSICAL EXAM:  Heart: RRR Lungs: generally clear, better air exchange Wound: Clean and dry    Lab Results: CBC:No results found for this basename: WBC:2,HGB:2,HCT:2,PLT:2 in the last 72 hours BMET: No results found for this basename: NA:2,K:2,CL:2,CO2:2,GLUCOSE:2,BUN:2,CREATININE:2,CALCIUM:2 in the last 72 hours  PT/INR: No results found for this basename: LABPROT,INR in the last 72 hours    Assessment/Plan: S/P Procedure(s) (LRB): FLEXIBLE BRONCHOSCOPY (N/A) THORACOTOMY MAJOR (Right) LOBECTOMY (Right) ID- Tmax 100 yesterday.  On D#4/7 Avelox, D#6 Diflucan. Pulm- improving. Continue pulm toilet, home O2 arranged. Home later this am if remains stable.   LOS: 14 days    COLLINS,GINA H 11/19/2012  Patient seen and examined. Agree with above.

## 2012-11-19 NOTE — Progress Notes (Signed)
NUTRITION FOLLOW UP  Intervention:    Editor, commissioning supplement 3 times daily between meals (250 kcals, 9 gm protein per 8 fl oz carton) RD to follow for nutrition care plan  Nutrition Dx:   Increased Nutrient Needs r/t post-op healing as evidenced by estimated nutrition needs, ongoing  Goal:   Oral intake with meals & supplements to meet >/= 90% of estimated nutrition needs, met  Monitor:   PO & supplemental intake, weight, labs, I/O's  Assessment:   Patient states her appetite is getting better.  PO intake 75-100% per flowsheet records.  Drinking at least one [employer] supplement per day.  Plans for discharge home today.  Height: Ht Readings from Last 1 Encounters:  11/05/12 5\' 6"  (1.676 m)    Weight Status:   Wt Readings from Last 1 Encounters:  11/12/12 115 lb 4.8 oz (52.3 kg)    Re-estimated needs:  Kcal: 1500-1700 Protein: 75-85 gm Fluid: 1.5-1.7 L  Skin: Intact  Diet Order: General   Intake/Output Summary (Last 24 hours) at 11/19/12 1106 Last data filed at 11/19/12 0700  Gross per 24 hour  Intake    960 ml  Output    200 ml  Net    760 ml    Labs:   Lab 11/15/12 0431 11/14/12 0435  NA 132* 132*  K 3.9 3.3*  CL 94* 95*  CO2 26 26  BUN 6 6  CREATININE 0.55 0.52  CALCIUM 8.9 8.7  MG -- --  PHOS -- --  GLUCOSE 107* 147*    Scheduled Meds:   . bisacodyl  10 mg Oral Daily  . calcium carbonate  1 tablet Oral TID WC  . feeding supplement  1 Container Oral TID BM  . ferrous gluconate  324 mg Oral Q breakfast  . fluconazole  100 mg Oral Daily  . guaiFENesin  1,200 mg Oral BID  . levalbuterol  0.63 mg Nebulization Q6H  . moxifloxacin  400 mg Oral Q2000  . pantoprazole  40 mg Oral BID AC  . polyethylene glycol  17 g Oral Daily  . potassium chloride  40 mEq Oral Daily    Kirkland Hun, RD, LDN Pager #: [phone] After-Hours Pager #: [phone]

## 2012-11-22 ENCOUNTER — Ambulatory Visit (INDEPENDENT_AMBULATORY_CARE_PROVIDER_SITE_OTHER): Payer: 59 | Admitting: Internal Medicine

## 2012-11-22 ENCOUNTER — Encounter: Payer: Self-pay | Admitting: Internal Medicine

## 2012-11-22 VITALS — BP 103/67 | HR 102 | Temp 98.8°F | Wt 110.0 lb

## 2012-11-22 DIAGNOSIS — J852 Abscess of lung without pneumonia: Secondary | ICD-10-CM

## 2012-11-22 DIAGNOSIS — A310 Pulmonary mycobacterial infection: Secondary | ICD-10-CM

## 2012-11-22 DIAGNOSIS — A318 Other mycobacterial infections: Secondary | ICD-10-CM

## 2012-11-22 MED ORDER — ETHAMBUTOL HCL 400 MG PO TABS: 800.0000 mg | ORAL_TABLET | Freq: Every day | ORAL | Status: DC

## 2012-11-22 MED ORDER — RIFAMPIN 300 MG PO CAPS: 600.0000 mg | ORAL_CAPSULE | Freq: Every day | ORAL | Status: DC

## 2012-11-22 MED ORDER — CLARITHROMYCIN 500 MG PO TABS: 1000.0000 mg | ORAL_TABLET | Freq: Every day | ORAL | Status: DC

## 2012-11-22 NOTE — Assessment & Plan Note (Signed)
It now is growing an AFB organism, c/w with granulomas.  It is much less likely Tb with no significant risk factors and a history of negative Quantiferon, ppd tests.  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  At this time, since she is ill and losing weeight, I am going to empirically start her on therapy for MAC with 1000 mg Clarithromycin, 600 mg Rifampin, and 800 mg Ethambutol.  Side effects discussed with her.  She also was advised that there is a possibility that it will be a different mycobaterial organism and her medications may need to be changed, however I feel it is prudent to start therapy sooner rather than later.  If she gets worse in the near future, she may need the addition of amikacin.  She will follow up in about 2-3 weeks and will call if she worsens or develops any sside effects, including eye problems, abdominal issues or significant n/v.  I also advised her to drink Boost/Ensure-like drinks to get protein and weight up which will be important for improving.

## 2012-11-22 NOTE — Progress Notes (Signed)
  Subjective:    Patient ID: [name] [name], female    DOB: 10/01/1956, 56 y.o.   MRN: [id number]  HPI   56 y.o. 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 Patient sees Dr. Drue Novel as her PCP who ordered a CXR and subsequent CT chest when she presented to him with right-sided pleuritic chest pain. CXR and CT showed a cavitary lesion in the right upper lobe. Patient was referred to Dr. 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 Patient saw Dr. Laneta Simmers in may who suggested a lobectomy of abscess. Patient sought second opinion at [facility], who recommended same treatment. Patient denied surgery due to fear of losing her job. She continued to have recurrent infections that she states were best treated with avelox. She later agreed to surgery, which was done on 11/05/12 with a right upper lobectomy and right lower lobe superior segmentectomy. Pathology showed granulomas and culture with Candida, but no pathogenic organisms grew.  She then completed a course for HCAP and went home with Avelox.  After discharge though, the AFB culture (smear negative) was positive, id pending.     She comes in today after being called with the positive results.  She relates a clinical history of continued SOB, though not worse, fatigue, poor appetite but not fever or chills.  She has no significant travel history outside of the [place], never in jail and no history of exposure to tuberculosis.  She continues to have significant pain of her left arm.  She does not require O2.      Review of Systems  Constitutional: Positive for activity change, appetite change, fatigue and unexpected weight change. Negative for fever and chills.  Respiratory: Positive for cough and shortness of breath. Negative for chest tightness and wheezing.   Cardiovascular: Negative for chest  pain.  Gastrointestinal: Negative for nausea, abdominal pain and diarrhea.  Skin: Negative for rash.  Neurological: Negative for dizziness and headaches.  Hematological: Negative for adenopathy.       Objective:   Physical Exam  Constitutional:       Thin and chronically ill appearing  Cardiovascular: Normal rate, regular rhythm and normal heart sounds.  Exam reveals no gallop and no friction rub.   No murmur heard. Pulmonary/Chest:       Coarse breath sounds          Assessment & Plan:

## 2012-11-26 ENCOUNTER — Inpatient Hospital Stay (HOSPITAL_COMMUNITY)
Admission: EM | Admit: 2012-11-26 | Discharge: 2012-12-04 | DRG: 948 | Disposition: A | Discharge status: Home / Self Care | Payer: 59 | Attending: Internal Medicine | Admitting: Internal Medicine

## 2012-11-26 ENCOUNTER — Emergency Department (HOSPITAL_COMMUNITY): Payer: 59

## 2012-11-26 ENCOUNTER — Other Ambulatory Visit: Payer: Self-pay | Admitting: *Deleted

## 2012-11-26 ENCOUNTER — Inpatient Hospital Stay (HOSPITAL_COMMUNITY): Payer: 59

## 2012-11-26 ENCOUNTER — Encounter (HOSPITAL_COMMUNITY): Payer: Self-pay | Admitting: Emergency Medicine

## 2012-11-26 DIAGNOSIS — R071 Chest pain on breathing: Secondary | ICD-10-CM

## 2012-11-26 DIAGNOSIS — J852 Abscess of lung without pneumonia: Secondary | ICD-10-CM

## 2012-11-26 DIAGNOSIS — A318 Other mycobacterial infections: Secondary | ICD-10-CM | POA: Diagnosis present

## 2012-11-26 DIAGNOSIS — A310 Pulmonary mycobacterial infection: Secondary | ICD-10-CM

## 2012-11-26 DIAGNOSIS — G4700 Insomnia, unspecified: Secondary | ICD-10-CM

## 2012-11-26 DIAGNOSIS — R197 Diarrhea, unspecified: Secondary | ICD-10-CM

## 2012-11-26 DIAGNOSIS — G8918 Other acute postprocedural pain: Secondary | ICD-10-CM

## 2012-11-26 DIAGNOSIS — E876 Hypokalemia: Secondary | ICD-10-CM | POA: Diagnosis present

## 2012-11-26 DIAGNOSIS — R0781 Pleurodynia: Secondary | ICD-10-CM

## 2012-11-26 DIAGNOSIS — E538 Deficiency of other specified B group vitamins: Secondary | ICD-10-CM

## 2012-11-26 DIAGNOSIS — Z9889 Other specified postprocedural states: Secondary | ICD-10-CM

## 2012-11-26 DIAGNOSIS — J90 Pleural effusion, not elsewhere classified: Secondary | ICD-10-CM | POA: Diagnosis present

## 2012-11-26 DIAGNOSIS — F40298 Other specified phobia: Secondary | ICD-10-CM | POA: Diagnosis present

## 2012-11-26 DIAGNOSIS — R918 Other nonspecific abnormal finding of lung field: Secondary | ICD-10-CM

## 2012-11-26 DIAGNOSIS — N809 Endometriosis, unspecified: Secondary | ICD-10-CM

## 2012-11-26 DIAGNOSIS — N390 Urinary tract infection, site not specified: Secondary | ICD-10-CM

## 2012-11-26 DIAGNOSIS — G8912 Acute post-thoracotomy pain: Principal | ICD-10-CM | POA: Diagnosis present

## 2012-11-26 DIAGNOSIS — L439 Lichen planus, unspecified: Secondary | ICD-10-CM

## 2012-11-26 DIAGNOSIS — F17200 Nicotine dependence, unspecified, uncomplicated: Secondary | ICD-10-CM

## 2012-11-26 DIAGNOSIS — D638 Anemia in other chronic diseases classified elsewhere: Secondary | ICD-10-CM | POA: Diagnosis present

## 2012-11-26 DIAGNOSIS — R1310 Dysphagia, unspecified: Secondary | ICD-10-CM

## 2012-11-26 DIAGNOSIS — J45909 Unspecified asthma, uncomplicated: Secondary | ICD-10-CM

## 2012-11-26 DIAGNOSIS — Z902 Acquired absence of lung [part of]: Secondary | ICD-10-CM

## 2012-11-26 DIAGNOSIS — M810 Age-related osteoporosis without current pathological fracture: Secondary | ICD-10-CM

## 2012-11-26 DIAGNOSIS — M7989 Other specified soft tissue disorders: Secondary | ICD-10-CM | POA: Diagnosis present

## 2012-11-26 DIAGNOSIS — Z87891 Personal history of nicotine dependence: Secondary | ICD-10-CM

## 2012-11-26 DIAGNOSIS — K7689 Other specified diseases of liver: Secondary | ICD-10-CM

## 2012-11-26 DIAGNOSIS — J449 Chronic obstructive pulmonary disease, unspecified: Secondary | ICD-10-CM

## 2012-11-26 DIAGNOSIS — R509 Fever, unspecified: Secondary | ICD-10-CM

## 2012-11-26 DIAGNOSIS — Z888 Allergy status to other drugs, medicaments and biological substances status: Secondary | ICD-10-CM

## 2012-11-26 DIAGNOSIS — Z87898 Personal history of other specified conditions: Secondary | ICD-10-CM

## 2012-11-26 DIAGNOSIS — I498 Other specified cardiac arrhythmias: Secondary | ICD-10-CM | POA: Diagnosis present

## 2012-11-26 DIAGNOSIS — R9389 Abnormal findings on diagnostic imaging of other specified body structures: Secondary | ICD-10-CM | POA: Diagnosis present

## 2012-11-26 DIAGNOSIS — D72829 Elevated white blood cell count, unspecified: Secondary | ICD-10-CM | POA: Diagnosis present

## 2012-11-26 DIAGNOSIS — Z881 Allergy status to other antibiotic agents status: Secondary | ICD-10-CM

## 2012-11-26 DIAGNOSIS — K559 Vascular disorder of intestine, unspecified: Secondary | ICD-10-CM

## 2012-11-26 DIAGNOSIS — K449 Diaphragmatic hernia without obstruction or gangrene: Secondary | ICD-10-CM | POA: Diagnosis present

## 2012-11-26 DIAGNOSIS — D649 Anemia, unspecified: Secondary | ICD-10-CM

## 2012-11-26 DIAGNOSIS — R0789 Other chest pain: Secondary | ICD-10-CM

## 2012-11-26 DIAGNOSIS — R634 Abnormal weight loss: Secondary | ICD-10-CM

## 2012-11-26 DIAGNOSIS — J189 Pneumonia, unspecified organism: Secondary | ICD-10-CM

## 2012-11-26 DIAGNOSIS — J4489 Other specified chronic obstructive pulmonary disease: Secondary | ICD-10-CM

## 2012-11-26 DIAGNOSIS — R002 Palpitations: Secondary | ICD-10-CM

## 2012-11-26 DIAGNOSIS — J479 Bronchiectasis, uncomplicated: Secondary | ICD-10-CM

## 2012-11-26 DIAGNOSIS — Z886 Allergy status to analgesic agent status: Secondary | ICD-10-CM

## 2012-11-26 DIAGNOSIS — R1319 Other dysphagia: Secondary | ICD-10-CM

## 2012-11-26 DIAGNOSIS — M1990 Unspecified osteoarthritis, unspecified site: Secondary | ICD-10-CM

## 2012-11-26 DIAGNOSIS — K209 Esophagitis, unspecified without bleeding: Secondary | ICD-10-CM | POA: Diagnosis present

## 2012-11-26 DIAGNOSIS — Z0000 Encounter for general adult medical examination without abnormal findings: Secondary | ICD-10-CM

## 2012-11-26 DIAGNOSIS — K219 Gastro-esophageal reflux disease without esophagitis: Secondary | ICD-10-CM

## 2012-11-26 HISTORY — DX: Pulmonary mycobacterial infection: A31.0

## 2012-11-26 LAB — COMPREHENSIVE METABOLIC PANEL
ALT: 12 U/L (ref 0–35)
Albumin: 2.5 g/dL — ABNORMAL LOW (ref 3.5–5.2)
Alkaline Phosphatase: 167 U/L — ABNORMAL HIGH (ref 39–117)
BUN: 4 mg/dL — ABNORMAL LOW (ref 6–23)
CO2: 20 mEq/L (ref 19–32)
Total Bilirubin: 0.5 mg/dL (ref 0.3–1.2)
Total Protein: 7.1 g/dL (ref 6.0–8.3)

## 2012-11-26 LAB — CBC WITH DIFFERENTIAL/PLATELET
Basophils Absolute: 0 10*3/uL (ref 0.0–0.1)
Eosinophils Relative: 0 % (ref 0–5)
HCT: 29.3 % — ABNORMAL LOW (ref 36.0–46.0)
Lymphocytes Relative: 4 % — ABNORMAL LOW (ref 12–46)
MCH: 31.7 pg (ref 26.0–34.0)
MCHC: 33.1 g/dL (ref 30.0–36.0)
MCV: 95.8 fL (ref 78.0–100.0)
Monocytes Absolute: 1 10*3/uL (ref 0.1–1.0)
RDW: 13.5 % (ref 11.5–15.5)

## 2012-11-26 LAB — URINALYSIS, ROUTINE W REFLEX MICROSCOPIC
Hgb urine dipstick: NEGATIVE
Ketones, ur: 15 mg/dL — AB
Nitrite: POSITIVE — AB
Protein, ur: NEGATIVE mg/dL
Specific Gravity, Urine: 1.015 (ref 1.005–1.030)
Urobilinogen, UA: 4 mg/dL — ABNORMAL HIGH (ref 0.0–1.0)
pH: 7 (ref 5.0–8.0)

## 2012-11-26 LAB — URINE MICROSCOPIC-ADD ON

## 2012-11-26 MED ORDER — FLUCONAZOLE 100 MG PO TABS
100.0000 mg | ORAL_TABLET | Freq: Every day | ORAL | Status: DC
  Administered 2012-11-27: 100 mg via ORAL
  Filled 2012-11-26: qty 1

## 2012-11-26 MED ORDER — IOHEXOL 350 MG/ML SOLN
80.0000 mL | Freq: Once | INTRAVENOUS | Status: AC | PRN
  Administered 2012-11-26: 80 mL via INTRAVENOUS

## 2012-11-26 MED ORDER — ETHAMBUTOL HCL 400 MG PO TABS
800.0000 mg | ORAL_TABLET | Freq: Every day | ORAL | Status: DC
  Administered 2012-11-26 – 2012-12-03 (×8): 800 mg via ORAL
  Filled 2012-11-26 (×9): qty 2

## 2012-11-26 MED ORDER — PANTOPRAZOLE SODIUM 40 MG PO TBEC
40.0000 mg | DELAYED_RELEASE_TABLET | Freq: Every day | ORAL | Status: DC
  Administered 2012-11-27: 40 mg via ORAL
  Filled 2012-11-26: qty 1

## 2012-11-26 MED ORDER — ONDANSETRON HCL 4 MG PO TABS: 4.0000 mg | ORAL_TABLET | Freq: Four times a day (QID) | ORAL | Status: DC | PRN

## 2012-11-26 MED ORDER — ACETAMINOPHEN 325 MG PO TABS
650.0000 mg | ORAL_TABLET | Freq: Four times a day (QID) | ORAL | Status: DC | PRN
  Administered 2012-11-30 – 2012-12-02 (×3): 650 mg via ORAL
  Filled 2012-11-26 (×3): qty 2

## 2012-11-26 MED ORDER — DEXTROSE 5 % IV SOLN
1.0000 g | Freq: Three times a day (TID) | INTRAVENOUS | Status: DC
  Administered 2012-11-26 – 2012-11-27 (×2): 1 g via INTRAVENOUS
  Filled 2012-11-26 (×4): qty 1

## 2012-11-26 MED ORDER — VANCOMYCIN HCL IN DEXTROSE 1-5 GM/200ML-% IV SOLN
1000.0000 mg | Freq: Once | INTRAVENOUS | Status: AC
  Administered 2012-11-26: 1000 mg via INTRAVENOUS
  Filled 2012-11-26: qty 200

## 2012-11-26 MED ORDER — ALUM & MAG HYDROXIDE-SIMETH 200-200-20 MG/5ML PO SUSP: 30.0000 mL | Freq: Four times a day (QID) | ORAL | Status: DC | PRN

## 2012-11-26 MED ORDER — NICOTINE POLACRILEX 4 MG MT LOZG
4.0000 mg | LOZENGE | OROMUCOSAL | Status: DC | PRN
  Filled 2012-11-26: qty 1

## 2012-11-26 MED ORDER — SODIUM CHLORIDE 0.9 % IV BOLUS (SEPSIS)
1000.0000 mL | Freq: Once | INTRAVENOUS | Status: AC
  Administered 2012-11-26: 1000 mL via INTRAVENOUS

## 2012-11-26 MED ORDER — HYDROCODONE-ACETAMINOPHEN 7.5-325 MG PO TABS: 1.0000 | ORAL_TABLET | Freq: Four times a day (QID) | ORAL | Status: DC | PRN

## 2012-11-26 MED ORDER — SODIUM CHLORIDE 0.9 % IV SOLN
INTRAVENOUS | Status: AC
  Administered 2012-11-26: 22:00:00 via INTRAVENOUS

## 2012-11-26 MED ORDER — CLARITHROMYCIN 500 MG PO TABS
1000.0000 mg | ORAL_TABLET | Freq: Every day | ORAL | Status: DC
  Administered 2012-11-26: 1000 mg via ORAL
  Filled 2012-11-26 (×2): qty 2

## 2012-11-26 MED ORDER — ENOXAPARIN SODIUM 40 MG/0.4ML ~~LOC~~ SOLN
40.0000 mg | SUBCUTANEOUS | Status: DC
  Administered 2012-11-26 – 2012-12-03 (×8): 40 mg via SUBCUTANEOUS
  Filled 2012-11-26 (×10): qty 0.4

## 2012-11-26 MED ORDER — ONDANSETRON HCL 4 MG/2ML IJ SOLN
4.0000 mg | Freq: Four times a day (QID) | INTRAMUSCULAR | Status: DC | PRN
  Administered 2012-12-02 – 2012-12-03 (×2): 4 mg via INTRAVENOUS
  Filled 2012-11-26 (×2): qty 2

## 2012-11-26 MED ORDER — RIFAMPIN 300 MG PO CAPS
600.0000 mg | ORAL_CAPSULE | Freq: Every day | ORAL | Status: DC
  Administered 2012-11-27 – 2012-12-04 (×8): 600 mg via ORAL
  Filled 2012-11-26 (×8): qty 2

## 2012-11-26 MED ORDER — OXYCODONE HCL 5 MG PO TABS
10.0000 mg | ORAL_TABLET | ORAL | Status: DC | PRN
  Administered 2012-11-26 – 2012-12-04 (×32): 10 mg via ORAL
  Filled 2012-11-26 (×33): qty 2

## 2012-11-26 MED ORDER — SODIUM CHLORIDE 0.9 % IJ SOLN
3.0000 mL | Freq: Two times a day (BID) | INTRAMUSCULAR | Status: DC
  Administered 2012-11-26 – 2012-12-03 (×7): 3 mL via INTRAVENOUS

## 2012-11-26 MED ORDER — DEXTROSE 5 % IV SOLN
1.0000 g | INTRAVENOUS | Status: DC
  Administered 2012-11-26: 1 g via INTRAVENOUS
  Filled 2012-11-26: qty 10

## 2012-11-26 MED ORDER — ACETAMINOPHEN 650 MG RE SUPP: 650.0000 mg | Freq: Four times a day (QID) | RECTAL | Status: DC | PRN

## 2012-11-26 MED ORDER — MORPHINE SULFATE 4 MG/ML IJ SOLN
4.0000 mg | Freq: Once | INTRAMUSCULAR | Status: AC
  Administered 2012-11-26: 4 mg via INTRAVENOUS
  Filled 2012-11-26: qty 1

## 2012-11-26 MED ORDER — SODIUM CHLORIDE 0.9 % IV SOLN
Freq: Once | INTRAVENOUS | Status: AC
  Administered 2012-11-26: 17:00:00 via INTRAVENOUS

## 2012-11-26 MED ORDER — VANCOMYCIN HCL IN DEXTROSE 1-5 GM/200ML-% IV SOLN
1000.0000 mg | Freq: Three times a day (TID) | INTRAVENOUS | Status: DC
  Administered 2012-11-27: 1000 mg via INTRAVENOUS
  Filled 2012-11-26 (×3): qty 200

## 2012-11-26 MED ORDER — TRAMADOL HCL 50 MG PO TABS
50.0000 mg | ORAL_TABLET | Freq: Four times a day (QID) | ORAL | Status: DC | PRN
  Administered 2012-11-26: 100 mg via ORAL
  Administered 2012-11-27: 50 mg via ORAL
  Filled 2012-11-26 (×2): qty 2

## 2012-11-26 NOTE — ED Notes (Signed)
Tamela Oddi (sister) cell: [phone].

## 2012-11-26 NOTE — ED Notes (Signed)
Pt continues to feel SOB. 92% RA. Pt placed on 2L [place].

## 2012-11-26 NOTE — Progress Notes (Addendum)
ANTIBIOTIC CONSULT NOTE - INITIAL  Pharmacy Consult for vanc Indication: rule out pneumonia  Allergies  Allergen Reactions  . Alendronate Sodium Other (See Comments)    REACTION: chest pain  . Antihistamines, Chlorpheniramine-Type Other (See Comments)    REACTION: "makes lungs bleed"  . Benadryl (Diphenhydramine) Other (See Comments)    REACTION: "makes lungs bleed"  . Nsaids Other (See Comments)    REACTION: ischemic colitis  . Other Other (See Comments)    All anti-inflammatories.  REACTION: ischemic colitis  . Pseudoephedrine Other (See Comments)    REACTION: Ischemic colitis  . Robitussin A-C (Guaifenesin-Codeine) Itching and Other (See Comments)    Palms itching  . Levofloxacin Anxiety    Patient Measurements: Height: 5\' 6"  (167.6 cm) Weight: 110 lb (49.896 kg) IBW/kg (Calculated) : 59.3   Vital Signs: Temp: 98.7 F (37.1 C) (12/23 2020) Temp src: Oral (12/23 2020) BP: 112/69 mmHg (12/23 2000) Pulse Rate: 100  (12/23 2000) Intake/Output from previous day:   Intake/Output from this shift: Total I/O In: 1000 [I.V.:1000] Out: -   Labs:  Basename 11/26/12 1631  WBC 14.6*  HGB 9.7*  PLT 505*  LABCREA --  CREATININE 0.41*   Estimated Creatinine Clearance: 63.3 ml/min (by C-G formula based on Cr of 0.41). No results found for this basename: VANCOTROUGH:2,VANCOPEAK:2,VANCORANDOM:2,GENTTROUGH:2,GENTPEAK:2,GENTRANDOM:2,TOBRATROUGH:2,TOBRAPEAK:2,TOBRARND:2,AMIKACINPEAK:2,AMIKACINTROU:2,AMIKACIN:2, in the last 72 hours   Microbiology: Recent Results (from the past 720 hour(s))  SURGICAL PCR SCREEN     Status: Normal   Collection Time   11/02/12 11:07 AM      Component Value Range Status Comment   MRSA, PCR NEGATIVE  NEGATIVE Final    Staphylococcus aureus NEGATIVE  NEGATIVE Final   AFB CULTURE WITH SMEAR     Status: Normal (Preliminary result)   Collection Time   11/05/12 12:12 PM      Component Value Range Status Comment   Specimen Description TISSUE LUNG  RIGHT   Final    Special Requests RIGHT UPPER LUNG LOBE PT ON ZINACEF   Final    ACID FAST SMEAR NO ACID FAST BACILLI SEEN   Final    Culture     Final    Value: ACID FAST BACILLI ISOLATED     Note: IDENTIFICATION TO FOLLOW CRITICAL RESULT CALLED TO, READ BACK BY AND VERIFIED WITH: Corrie Dandy CARTER 12/17 AT 1638 BY DUNNJ   Report Status PENDING   Incomplete   FUNGUS CULTURE W SMEAR     Status: Normal (Preliminary result)   Collection Time   11/05/12 12:12 PM      Component Value Range Status Comment   Specimen Description TISSUE LUNG RIGHT   Final    Special Requests RIGHT UPPER LUNG LOBE PT ON ZINACEF   Final    Fungal Smear NO YEAST OR FUNGAL ELEMENTS SEEN   Final    Culture CULTURE IN PROGRESS FOR FOUR WEEKS   Final    Report Status PENDING   Incomplete   TISSUE CULTURE     Status: Normal   Collection Time   11/05/12 12:12 PM      Component Value Range Status Comment   Specimen Description TISSUE LUNG RIGHT   Final    Special Requests RIGHT UPPER LUNG LOBE PT ON ZINACEF   Final    Gram Stain     Final    Value: RARE WBC PRESENT,BOTH PMN AND MONONUCLEAR     NO ORGANISMS SEEN   Culture NO GROWTH 3 DAYS   Final    Report Status 11/08/2012  FINAL   Final   ANAEROBIC CULTURE     Status: Normal   Collection Time   11/05/12 12:17 PM      Component Value Range Status Comment   Specimen Description TISSUE LUNG RIGHT   Final    Special Requests RIGHT UPPER LUNG LOBE PT ON ZINACEF   Final    Gram Stain     Final    Value: RARE WBC PRESENT,BOTH PMN AND MONONUCLEAR     NO ORGANISMS SEEN   Culture NO ANAEROBES ISOLATED   Final    Report Status 11/10/2012 FINAL   Final   CULTURE, BLOOD (ROUTINE X 2)     Status: Normal   Collection Time   11/11/12  6:15 PM      Component Value Range Status Comment   Specimen Description BLOOD RIGHT ARM   Final    Special Requests BOTTLES DRAWN AEROBIC AND ANAEROBIC 10CC EA   Final    Culture  Setup Time 11/12/2012 09:15   Final    Culture NO GROWTH 5 DAYS    Final    Report Status 11/18/2012 FINAL   Final   CULTURE, BLOOD (ROUTINE X 2)     Status: Normal   Collection Time   11/11/12  6:21 PM      Component Value Range Status Comment   Specimen Description BLOOD LEFT ARM   Final    Special Requests BOTTLES DRAWN AEROBIC AND ANAEROBIC 10CC EA   Final    Culture  Setup Time 11/12/2012 09:14   Final    Culture NO GROWTH 5 DAYS   Final    Report Status 11/18/2012 FINAL   Final   CULTURE, EXPECTORATED SPUTUM-ASSESSMENT     Status: Normal   Collection Time   11/12/12  9:59 PM      Component Value Range Status Comment   Specimen Description SPUTUM   Final    Special Requests NONE   Final    Sputum evaluation     Final    Value: THIS SPECIMEN IS ACCEPTABLE. RESPIRATORY CULTURE REPORT TO FOLLOW.   Report Status 11/13/2012 FINAL   Final   CULTURE, RESPIRATORY     Status: Normal   Collection Time   11/12/12  9:59 PM      Component Value Range Status Comment   Specimen Description SPUTUM   Final    Special Requests ADDED 11/13/12   Final    Gram Stain     Final    Value: RARE WBC PRESENT,BOTH PMN AND MONONUCLEAR     FEW SQUAMOUS EPITHELIAL CELLS PRESENT     MODERATE YEAST   Culture ABUNDANT CANDIDA ALBICANS   Final    Report Status 11/15/2012 FINAL   Final     Medical History: Past Medical History  Diagnosis Date  . COPD (chronic obstructive pulmonary disease)     per CT 11/10/05, will minimal bronchiectasis rll  . GERD (gastroesophageal reflux disease)   . Osteoporosis     dexa per gyn  . 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 Headache   . Lichen planus   . B12 deficiency   . Endometriosis   . Cataracts, bilateral   . Arthritis   . Lung mass   . Asthma     .  years ago- has not had problem for years.  . Pneumonia     last ime 2008  . Hemorrhoid   . Pneumothorax, spontaneous, tension     x  2 in her 20's.  has a chest tube for one    Medications:   (Not in a hospital admission) Scheduled:    . enoxaparin (LOVENOX)  injection  40 mg Subcutaneous Q24H  . [COMPLETED]  morphine injection  4 mg Intravenous Once  . vancomycin  1,000 mg Intravenous Once   Assessment: 56 yo who was admitted for fever. She was recently dx with MAC and has been on therapy with clarithromycin/ethambutol/rifampin. She also c/o chronic diarrhea. Now being r/o for bacterial PNA. She was recently on vanc during last admission and required q8 dosing so will try that regimen again.   Goal of Therapy:  Vancomycin trough level 15-20 mcg/ml  Plan:  Vanc 1g IV x1 then 1g IV q8 Cont MAC regimen  Ulyses Southward [place] 11/26/2012,8:31 PM

## 2012-11-26 NOTE — ED Notes (Signed)
Patient and mass left lung removed 2 weeks ago and discharged.  Low grade fever since however fever increased to 102 at home.  EMS called Fever and diarrhea with HR elevated 117.  IV left AC 20g. Pain medication taken prior to EMS arrival.

## 2012-11-26 NOTE — ED Provider Notes (Signed)
History     CSN: [id number]  Arrival date & time 11/26/12  1512   First MD Initiated Contact with Patient 11/26/12 1524      Chief Complaint  Patient presents with  . Fever  . Diarrhea    (Consider location/radiation/quality/duration/timing/severity/associated sxs/prior treatment) Patient is a 56 y.o. female presenting with fever and diarrhea. The history is provided by the patient.  Fever Primary symptoms of the febrile illness include fever and diarrhea.  Diarrhea The primary symptoms include fever and diarrhea.  She is 3 weeks post right upper lobectomy and is currently on antibiotics for possible mycobacterial infection. She states that she is felt weak in general since her surgery, and had been having low-grade fevers for the last several days. Today, fever went up to 101.9. She denies cough, nausea, vomiting. She has been having diarrhea which is getting worse. She denies abdominal pain.  Past Medical History  Diagnosis Date  . COPD (chronic obstructive pulmonary disease)     per CT 11/10/05, will minimal bronchiectasis rll  . GERD (gastroesophageal reflux disease)   . Osteoporosis     dexa per gyn  . 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 Headache   . Lichen planus   . B12 deficiency   . Endometriosis   . Cataracts, bilateral   . Arthritis   . Lung mass   . Asthma     .  years ago- has not had problem for years.  . Pneumonia     last ime 2008  . Hemorrhoid   . Pneumothorax, spontaneous, tension     x 2 in her 20's.  has a chest tube for one    Past Surgical History  Procedure Date  . Knee surgery     .not replacement .  Marland Kitchen Ovarian cyst removed   . Video bronchoscopy 02/15/2012    Procedure: VIDEO BRONCHOSCOPY WITH FLUORO;  Surgeon: Nyoka Cowden, MD;  Location: [facility];  Service: Endoscopy;  Laterality: Bilateral;  WASHING- INTERVENTION BIOPSIES INTERVENTION BRONCHOSCOPY WITH VIDEO  . Mediastinoscopy 05/25/2012    Procedure: MEDIASTINOSCOPY;   Surgeon: Alleen Borne, MD;  Location: [facility] OR;  Service: Thoracic;  Laterality: N/A;  . Tonsillectomy   . Flexible bronchoscopy 11/05/2012    Procedure: FLEXIBLE BRONCHOSCOPY;  Surgeon: Alleen Borne, MD;  Location: [facility] OR;  Service: Thoracic;  Laterality: N/A;  . Thoracotomy 11/05/2012    Procedure: THORACOTOMY MAJOR;  Surgeon: Alleen Borne, MD;  Location: [facility] OR;  Service: Thoracic;  Laterality: Right;  . Lobectomy 11/05/2012    Procedure: LOBECTOMY;  Surgeon: Alleen Borne, MD;  Location: [facility] OR;  Service: Thoracic;  Laterality: Right;  right upper lobectomy    Family History  Problem Relation Age of Onset  . Colon cancer Neg Hx   . Breast cancer Neg Hx   . Diabetes Mother     GM  . Heart attack      ?  Marland Kitchen Heart disease Mother   . Heart disease Maternal Grandfather   . Cancer Father     sarcoma    History  Substance Use Topics  . Smoking status: Current Every Day Smoker -- 1.0 packs/day for 38 years    Types: Cigarettes  . Smokeless tobacco: Never Used  . Alcohol Use: No     Comment: wine occ    OB History    Grav Para Term Preterm Abortions TAB SAB Ect Mult Living  Review of Systems  Constitutional: Positive for fever.  Gastrointestinal: Positive for diarrhea.  All other systems reviewed and are negative.    Allergies  Alendronate sodium; Antihistamines, chlorpheniramine-type; Benadryl; Nsaids; Other; Pseudoephedrine; Robitussin a-c; and Levofloxacin  Home Medications   Current Outpatient Rx  Name  Route  Sig  Dispense  Refill  . ALPRAZOLAM 0.5 MG PO TABS   Oral   Take 0.25-0.5 mg by mouth at bedtime as needed. For insomnia.         Marland Kitchen CALCIUM CARBONATE ANTACID 500 MG PO CHEW   Oral   Chew 1 tablet by mouth daily.          Marland Kitchen CLARITHROMYCIN 500 MG PO TABS   Oral   Take 2 tablets (1,000 mg total) by mouth daily.   60 tablet   5   . ETHAMBUTOL HCL 400 MG PO TABS   Oral   Take 2 tablets (800 mg total) by mouth daily.   60 tablet    5   . FERROUS GLUCONATE 324 (38 FE) MG PO TABS   Oral   Take 1 tablet (324 mg total) by mouth daily with breakfast.         . MUCINEX PO   Oral   Take 1 tablet by mouth daily.          Marland Kitchen NICOTINE 10 MG IN INHA   Inhalation   Inhale 1 puff into the lungs every hour as needed. For nicotine cravings.         Marland Kitchen NICOTINE POLACRILEX 4 MG MT LOZG   Buccal   Place 4 mg inside cheek every 3 (three) hours as needed. For nicotine cravings.         . NONFORMULARY OR COMPOUNDED ITEM   Oral   Take 1 tablet by mouth daily. Phytomega (cholesterol supplement)         . OMEPRAZOLE 20 MG PO CPDR   Oral   Take 20 mg by mouth daily.          . OXYCODONE HCL 10 MG PO TABS   Oral   Take 1 tablet (10 mg total) by mouth every 3 (three) hours as needed.   50 tablet   0   . RIFAMPIN 300 MG PO CAPS   Oral   Take 2 capsules (600 mg total) by mouth daily.   60 capsule   5   . TRAMADOL HCL 50 MG PO TABS   Oral   Take 1-2 tablets (50-100 mg total) by mouth every 6 (six) hours as needed.   50 tablet   0     BP 117/75  Pulse 115  Temp 101 F (38.3 C) (Oral)  Resp 30  SpO2 95%  LMP 11/05/2004  Physical Exam  Nursing note and vitals reviewed. 56 year old female, resting comfortably and in no acute distress. Vital signs are significant for tachypnea with respiratory rate of 30, and tachycardia with heart rate of 115, and fever with temperature of 101. Oxygen saturation is 95%, which is normal. Head is normocephalic and atraumatic. PERRLA, EOMI. Oropharynx is clear. Neck is nontender and supple without adenopathy or JVD. Back is nontender and there is no CVA tenderness. Lungs are clear without rales, wheezes, or rhonchi. Chest is nontender. Heart has regular rate and rhythm without murmur. Abdomen is soft, flat, nontender without masses or hepatosplenomegaly and peristalsis is normoactive. Extremities have no cyanosis or edema, full range of motion is present. Skin is warm and  dry without rash. Neurologic: Mental status is normal, cranial nerves are intact, there are no motor or sensory deficits.   ED Course  Procedures (including critical care time)  Results for orders placed during the hospital encounter of 11/26/12  CBC WITH DIFFERENTIAL      Component Value Range   WBC 14.6 (*) 4.0 - 10.5 K/uL   RBC 3.06 (*) 3.87 - 5.11 MIL/uL   Hemoglobin 9.7 (*) 12.0 - 15.0 g/dL   HCT 47.8 (*) 29.5 - 62.1 %   MCV 95.8  78.0 - 100.0 fL   MCH 31.7  26.0 - 34.0 pg   MCHC 33.1  30.0 - 36.0 g/dL   RDW 30.8  65.7 - 84.6 %   Platelets 505 (*) 150 - 400 K/uL   Neutrophils Relative 89 (*) 43 - 77 %   Neutro Abs 13.0 (*) 1.7 - 7.7 K/uL   Lymphocytes Relative 4 (*) 12 - 46 %   Lymphs Abs 0.6 (*) 0.7 - 4.0 K/uL   Monocytes Relative 7  3 - 12 %   Monocytes Absolute 1.0  0.1 - 1.0 K/uL   Eosinophils Relative 0  0 - 5 %   Eosinophils Absolute 0.0  0.0 - 0.7 K/uL   Basophils Relative 0  0 - 1 %   Basophils Absolute 0.0  0.0 - 0.1 K/uL  COMPREHENSIVE METABOLIC PANEL      Component Value Range   Sodium 132 (*) 135 - 145 mEq/L   Potassium 3.8  3.5 - 5.1 mEq/L   Chloride 97  96 - 112 mEq/L   CO2 20  19 - 32 mEq/L   Glucose, Bld 117 (*) 70 - 99 mg/dL   BUN 4 (*) 6 - 23 mg/dL   Creatinine, Ser 9.62 (*) 0.50 - 1.10 mg/dL   Calcium 8.7  8.4 - 95.2 mg/dL   Total Protein 7.1  6.0 - 8.3 g/dL   Albumin 2.5 (*) 3.5 - 5.2 g/dL   AST 18  0 - 37 U/L   ALT 12  0 - 35 U/L   Alkaline Phosphatase 167 (*) 39 - 117 U/L   Total Bilirubin 0.5  0.3 - 1.2 mg/dL   GFR calc non Af Amer >90  >90 mL/min   GFR calc Af Amer >90  >90 mL/min  URINALYSIS, ROUTINE W REFLEX MICROSCOPIC      Component Value Range   Color, Urine AMBER (*) YELLOW   APPearance CLEAR  CLEAR   Specific Gravity, Urine 1.015  1.005 - 1.030   pH 7.0  5.0 - 8.0   Glucose, UA NEGATIVE  NEGATIVE mg/dL   Hgb urine dipstick NEGATIVE  NEGATIVE   Bilirubin Urine SMALL (*) NEGATIVE   Ketones, ur 15 (*) NEGATIVE mg/dL   Protein,  ur NEGATIVE  NEGATIVE mg/dL   Urobilinogen, UA 4.0 (*) 0.0 - 1.0 mg/dL   Nitrite POSITIVE (*) NEGATIVE   Leukocytes, UA SMALL (*) NEGATIVE  URINE MICROSCOPIC-ADD ON      Component Value Range   Squamous Epithelial / LPF FEW (*) RARE   WBC, UA 3-6  <3 WBC/hpf   RBC / HPF 0-2  <3 RBC/hpf   Bacteria, UA RARE  RARE   Dg Chest 2 View  11/26/2012  *RADIOLOGY REPORT*  Clinical Data: Fever  CHEST - 2 VIEW  Comparison: 09/2012  Findings: Interstitial prominence.  Bronchitic changes.  Increased AP diameter of the chest.  Mild cardiomegaly.  Normal pulmonary vascularity.  Loculated right apical hydropneumothorax has  improved.  Heterogeneous opacities throughout the right mid and lower lung zones have improved.  Minimal residual subsegmental atelectasis at the left base.  IMPRESSION: Improved loculated right hydropneumothorax.  Improved heterogeneous pulmonary opacities in the right mid and lower lung zones.   Original Report Authenticated By: Jolaine Click, M.D.     Date: 11/26/2012  Rate: 110  Rhythm: sinus tachycardia  QRS Axis: normal  Intervals: normal  ST/T Wave abnormalities: normal  Conduction Disutrbances:none  Narrative Interpretation: Sinus tachycardia, otherwise normal ECG. When compared with ECG of 05/18/2012, no significant changes are seen.  Old EKG Reviewed: unchanged    No diagnosis found.    MDM  Fever, weakness, tachycardia in the setting of recent lung surgery. 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   Workup is significant for urinary tract infection and that is the probable cause of her fever today. She started on antibiotics of Rocephin. Stool Clostridium difficile is still pending. Case was discussed with Dr. Irene Limbo of  [facility] who agrees to admit the patient under observation status. She will be reevaluated after overnight hydration and antibiotics.     Dione Booze, MD 11/26/12 [phone]

## 2012-11-26 NOTE — H&P (Addendum)
History and Physical  [name] [name] ZOX:[id number] DOB: 1956/01/08 DOA: 11/26/2012  Referring physician: Dione Booze, MD PCP: Willow Ora, MD  Infectious disease: Staci Righter, M.D.  Chief Complaint: fever  HPI:  56 year old woman past medical history presumed MAC started on antibiotics 12/16 with recent thoracotomy and upper lobectomy earlier December presented to the emergency department with complaints of fever, subacute area, pleuritic chest pain, shortness of breath and was referred for admission.  Patient with history of right upper lobe cavitary lung mass. Status post thoracotomy with right upper lobectomy earlier this month. Postop course complicated by lung collapse and airspace disease, treated with vancomycin and Zosyn. Pathology was negative. Cultures revealed acid-fast bacillus and patient was seen by infectious disease, diagnosed with presumed MAC and started on empiric therapy. By review of Dr. Ephriam Knuckles note, it was noted that if she worsened amikacin should be considered.   Patient reports ongoing diarrhea since before discharge earlier this month. It has perhaps worsened and she currently has 5+ stools per day. However, what caused her to come to the emergency department today was sudden onset of fever at home this morning as well as pleuritic chest pain with associated shortness of breath, separate from her post surgery pain. She has no history of coronary artery disease. No history of dysuria, frequency. No aggravating or alleviating factors noted. Pleuritic chest pain is located in the center of her chest with radiation to the back. She denies leg swelling or leg pain. COPD is listed but she denies using inhalers or oxygen at home.  In the ED temperature 101, respiratory rate 30, minimal tachycardia, normal blood pressure and oxygenation. Blood cell count 14.6. Chest x-ray showed improvement. EKG, sinus tachycardia no acute changes.  Chart Review:  As above  Review of Systems:   Negative for visual changes, sore throat, cough, congestion, rash, dysuria, bleeding, n/v/abdominal pain.  Past Medical History  Diagnosis Date  . COPD (chronic obstructive pulmonary disease)     per CT 11/10/05, will minimal bronchiectasis rll  . GERD (gastroesophageal reflux disease)   . Osteoporosis     dexa per gyn  . 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 Headache   . Lichen planus   . B12 deficiency   . Endometriosis   . Cataracts, bilateral   . Arthritis   . Lung mass   . Asthma     .  years ago- has not had problem for years.  . Pneumonia     last ime 2008  . Hemorrhoid   . Pneumothorax, spontaneous, tension     x 2 in her 20's.  has a chest tube for one    Past Surgical History  Procedure Date  . Knee surgery     .not replacement .  Marland Kitchen Ovarian cyst removed   . Video bronchoscopy 02/15/2012    Procedure: VIDEO BRONCHOSCOPY WITH FLUORO;  Surgeon: Nyoka Cowden, MD;  Location: [facility];  Service: Endoscopy;  Laterality: Bilateral;  WASHING- INTERVENTION BIOPSIES INTERVENTION BRONCHOSCOPY WITH VIDEO  . Mediastinoscopy 05/25/2012    Procedure: MEDIASTINOSCOPY;  Surgeon: Alleen Borne, MD;  Location: [facility] OR;  Service: Thoracic;  Laterality: N/A;  . Tonsillectomy   . Flexible bronchoscopy 11/05/2012    Procedure: FLEXIBLE BRONCHOSCOPY;  Surgeon: Alleen Borne, MD;  Location: [facility] OR;  Service: Thoracic;  Laterality: N/A;  . Thoracotomy 11/05/2012    Procedure: THORACOTOMY MAJOR;  Surgeon: Alleen Borne, MD;  Location: [facility] OR;  Service: Thoracic;  Laterality: Right;  . Lobectomy 11/05/2012    Procedure: LOBECTOMY;  Surgeon: Alleen Borne, MD;  Location: [facility] OR;  Service: Thoracic;  Laterality: Right;  right upper lobectomy    Social History:  reports that she has quit smoking. Her smoking use included Cigarettes. She has a 38 pack-year smoking history. She has never used smokeless tobacco. She reports that she does not drink alcohol or use illicit  drugs.  Allergies  Allergen Reactions  . Alendronate Sodium Other (See Comments)    REACTION: chest pain  . Antihistamines, Chlorpheniramine-Type Other (See Comments)    REACTION: "makes lungs bleed"  . Benadryl (Diphenhydramine) Other (See Comments)    REACTION: "makes lungs bleed"  . Nsaids Other (See Comments)    REACTION: ischemic colitis  . Other Other (See Comments)    All anti-inflammatories.  REACTION: ischemic colitis  . Pseudoephedrine Other (See Comments)    REACTION: Ischemic colitis  . Robitussin A-C (Guaifenesin-Codeine) Itching and Other (See Comments)    Palms itching  . Levofloxacin Anxiety    Family History  Problem Relation Age of Onset  . Colon cancer Neg Hx   . Breast cancer Neg Hx   . Diabetes Mother     GM  . Heart attack      ?  Marland Kitchen Heart disease Mother   . Heart disease Maternal Grandfather   . Cancer Father     sarcoma     Prior to Admission medications   Medication Sig Start Date End Date Taking? Authorizing Provider  acetaminophen (TYLENOL) 500 MG tablet Take 500 mg by mouth once as needed. For fever   Yes Historical Provider, MD  ALPRAZolam (XANAX) 0.5 MG tablet Take 0.25-0.5 mg by mouth at bedtime as needed. For insomnia.   Yes Historical Provider, MD  clarithromycin (BIAXIN) 500 MG tablet Take 1,000 mg by mouth daily with supper.    Yes Gardiner Barefoot, MD  ethambutol (MYAMBUTOL) 400 MG tablet Take 800 mg by mouth daily. Takes 8x 100mg  strength   Yes Gardiner Barefoot, MD  ferrous gluconate (FERGON) 324 MG tablet Take 1 tablet (324 mg total) by mouth daily with breakfast. 11/18/12  Yes Erin Barrett, PA  fluconazole (DIFLUCAN) 100 MG tablet Take 100 mg by mouth daily.   Yes Historical Provider, MD  nicotine (NICOTROL) 10 MG inhaler Inhale 1 puff into the lungs every hour as needed. For nicotine cravings.   Yes Historical Provider, MD  nicotine polacrilex (NICORETTE) 4 MG lozenge Place 4 mg inside cheek every 3 (three) hours as needed. For nicotine  cravings.   Yes Historical Provider, MD  omeprazole (PRILOSEC) 20 MG capsule Take 20 mg by mouth daily.    Yes Historical Provider, MD  Oxycodone HCl 10 MG TABS Take 10 mg by mouth every 3 (three) hours as needed. For pain 11/18/12  Yes Erin Barrett, PA  rifampin (RIFADIN) 300 MG capsule Take 600 mg by mouth daily.   Yes Gardiner Barefoot, MD  traMADol (ULTRAM) 50 MG tablet Take 50-100 mg by mouth every 6 (six) hours as needed.   Yes Erin Barrett, PA  ethambutol (MYAMBUTOL) 400 MG tablet Take 800 mg by mouth daily. 11/27/12   Historical Provider, MD  HYDROcodone-acetaminophen (NORCO) 7.5-325 MG per tablet Take 1 tablet by mouth every 6 (six) hours as needed for pain (1 tablet every 4-6 hrs as needed for pain). 11/26/12   Alleen Borne, MD  NONFORMULARY OR COMPOUNDED ITEM Take 1 tablet by mouth daily. Phytomega (  cholesterol supplement)    Historical Provider, MD   Physical Exam: Filed Vitals:   11/26/12 1513 11/26/12 1518 11/26/12 1800  BP:  117/75 122/71  Pulse:  115 104  Temp:  101 F (38.3 C)   TempSrc:  Oral   Resp:  30   SpO2: 96% 95% 99%    General:  Examined in emergency department. Appears calm, generally weak and moderately ill but nontoxic Eyes: PERRL, normal lids, irises  ENT: grossly normal hearing, lips & tongue Neck: no LAD, masses or thyromegaly Cardiovascular: Tachycardic, regular rhythm, no m/r/g. No LE edema. Respiratory: Decreased breath sounds bilaterally right greater than left, inspiratory crackles in the right greater than left. Mild increased respiratory effort. Abdomen: soft, ntnd Skin: no rash or induration seen, incision healing well, PAC appears unremarkable Musculoskeletal: grossly normal tone BUE/BLE Psychiatric: grossly normal mood and affect, speech fluent and appropriate Neurologic: grossly non-focal.  Wt Readings from Last 3 Encounters:  11/22/12 49.896 kg (110 lb)  11/12/12 52.3 kg (115 lb 4.8 oz)  11/12/12 52.3 kg (115 lb 4.8 oz)    Labs on  Admission:  Basic Metabolic Panel:  Lab 11/26/12 8657  NA 132*  K 3.8  CL 97  CO2 20  GLUCOSE 117*  BUN 4*  CREATININE 0.41*  CALCIUM 8.7  MG --  PHOS --    Liver Function Tests:  Lab 11/26/12 1631  AST 18  ALT 12  ALKPHOS 167*  BILITOT 0.5  PROT 7.1  ALBUMIN 2.5*   CBC:  Lab 11/26/12 1631  WBC 14.6*  NEUTROABS 13.0*  HGB 9.7*  HCT 29.3*  MCV 95.8  PLT 505*    Radiological Exams on Admission: Dg Chest 2 View  11/26/2012  *RADIOLOGY REPORT*  Clinical Data: Fever  CHEST - 2 VIEW  Comparison: 09/2012  Findings: Interstitial prominence.  Bronchitic changes.  Increased AP diameter of the chest.  Mild cardiomegaly.  Normal pulmonary vascularity.  Loculated right apical hydropneumothorax has improved.  Heterogeneous opacities throughout the right mid and lower lung zones have improved.  Minimal residual subsegmental atelectasis at the left base.  IMPRESSION: Improved loculated right hydropneumothorax.  Improved heterogeneous pulmonary opacities in the right mid and lower lung zones.   Original Report Authenticated By: Jolaine Click, M.D.     EKG: Independently reviewed. As above   Principal Problem:  *Pleuritic chest pain Active Problems:  Fever  MAC (mycobacterium avium-intracellulare complex)  Diarrhea  Anemia   Assessment/Plan 1. Fever--sudden onset in conjunction with pleuritic chest pain and shortness of breath suggests pulmonary etiology. CT chest to rule out PE and further evaluate lung parenchyma to exclude pneumonia or postoperative complication. Empiric antibiotics. Oxygenation normal and respirations unlabored currently. Continue treatment for MAC. Symptoms not suggestive of influenza. Favor infectious process over PE. If CTA positive for PE, start therapeutic Lovenox. 2. Pleuritic chest pain with associated shortness of breath--EKG unremarkable, no evidence of pericarditis. Need to exclude PE--check CT chest. History is not suggestive of acute coronary  syndrome. Check lower extremity Dopplers. 3. Presumed MAC--continue treatment as per infectious disease. 4. Subacute diarrhea--given history, hospitalization and ongoing nature cannot exclude C. difficile diarrhea although overall presentation makes doubtful. Check C. difficile PCR. 5. Normocytic anemia--stable 6. Equivocal urinalysis--followup culture. History does not suggest urinary tract infection and this is doubted at this point.  Code Status: Full code Family Communication: None present Disposition Plan/Anticipated LOS: Admit/2-3 days  Time spent: 65 minutes  Brendia Sacks, MD  [facility] Team 4  Pager [phone] If  7PM-7AM, please contact night-coverage at [email or web address], password [facility] 11/26/2012, 6:25 PM

## 2012-11-27 DIAGNOSIS — D649 Anemia, unspecified: Secondary | ICD-10-CM

## 2012-11-27 DIAGNOSIS — J189 Pneumonia, unspecified organism: Secondary | ICD-10-CM

## 2012-11-27 DIAGNOSIS — R071 Chest pain on breathing: Secondary | ICD-10-CM

## 2012-11-27 DIAGNOSIS — Z09 Encounter for follow-up examination after completed treatment for conditions other than malignant neoplasm: Secondary | ICD-10-CM

## 2012-11-27 LAB — BASIC METABOLIC PANEL
CO2: 24 mEq/L (ref 19–32)
Calcium: 8.8 mg/dL (ref 8.4–10.5)
Chloride: 99 mEq/L (ref 96–112)
Glucose, Bld: 114 mg/dL — ABNORMAL HIGH (ref 70–99)
Sodium: 135 mEq/L (ref 135–145)

## 2012-11-27 LAB — CBC
HCT: 29.6 % — ABNORMAL LOW (ref 36.0–46.0)
Hemoglobin: 9.5 g/dL — ABNORMAL LOW (ref 12.0–15.0)
MCH: 31 pg (ref 26.0–34.0)
MCV: 96.7 fL (ref 78.0–100.0)
Platelets: 473 10*3/uL — ABNORMAL HIGH (ref 150–400)
RBC: 3.06 MIL/uL — ABNORMAL LOW (ref 3.87–5.11)

## 2012-11-27 LAB — URINE CULTURE
Colony Count: NO GROWTH
Culture: NO GROWTH

## 2012-11-27 LAB — TROPONIN I: Troponin I: <0.3 ng/mL (ref <0.30)

## 2012-11-27 MED ORDER — PANTOPRAZOLE SODIUM 40 MG PO TBEC: 40.0000 mg | DELAYED_RELEASE_TABLET | Freq: Two times a day (BID) | ORAL | Status: DC

## 2012-11-27 MED ORDER — SUCRALFATE 1 GM/10ML PO SUSP
1.0000 g | Freq: Three times a day (TID) | ORAL | Status: DC
  Administered 2012-11-27 – 2012-12-04 (×26): 1 g via ORAL
  Filled 2012-11-27 (×31): qty 10

## 2012-11-27 MED ORDER — BIOTENE DRY MOUTH MT LIQD
15.0000 mL | Freq: Two times a day (BID) | OROMUCOSAL | Status: DC
  Administered 2012-11-27 – 2012-12-03 (×12): 15 mL via OROMUCOSAL

## 2012-11-27 MED ORDER — SODIUM CHLORIDE 0.9 % IV SOLN
150.0000 mg | Freq: Every day | INTRAVENOUS | Status: DC
  Administered 2012-11-27 – 2012-12-01 (×5): 150 mg via INTRAVENOUS
  Filled 2012-11-27 (×5): qty 150

## 2012-11-27 MED ORDER — VITAMIN B-6 50 MG PO TABS
50.0000 mg | ORAL_TABLET | Freq: Every day | ORAL | Status: DC
  Administered 2012-11-27 – 2012-12-04 (×8): 50 mg via ORAL
  Filled 2012-11-27 (×8): qty 1

## 2012-11-27 MED ORDER — SODIUM CHLORIDE 0.9 % IV SOLN
250.0000 mg | Freq: Four times a day (QID) | INTRAVENOUS | Status: AC
  Administered 2012-11-27 – 2012-12-03 (×26): 250 mg via INTRAVENOUS
  Filled 2012-11-27 (×26): qty 250

## 2012-11-27 MED ORDER — AZITHROMYCIN 500 MG PO TABS
500.0000 mg | ORAL_TABLET | Freq: Every day | ORAL | Status: DC
  Administered 2012-11-27: 500 mg via ORAL
  Filled 2012-11-27: qty 1

## 2012-11-27 MED ORDER — PANTOPRAZOLE SODIUM 40 MG IV SOLR
40.0000 mg | Freq: Two times a day (BID) | INTRAVENOUS | Status: DC
  Administered 2012-11-27 – 2012-12-01 (×9): 40 mg via INTRAVENOUS
  Filled 2012-11-27 (×12): qty 40

## 2012-11-27 MED ORDER — SODIUM CHLORIDE 0.9 % IV SOLN
INTRAVENOUS | Status: DC
  Administered 2012-11-28: 1000 mL via INTRAVENOUS
  Administered 2012-11-29: 14:00:00 via INTRAVENOUS
  Administered 2012-11-30: 20 mL/h via INTRAVENOUS
  Administered 2012-11-30: 18:00:00 via INTRAVENOUS
  Administered 2012-12-03: 250 mL via INTRAVENOUS
  Administered 2012-12-03: 21:00:00 via INTRAVENOUS

## 2012-11-27 MED ORDER — SACCHAROMYCES BOULARDII 250 MG PO CAPS
250.0000 mg | ORAL_CAPSULE | Freq: Two times a day (BID) | ORAL | Status: DC
  Administered 2012-11-27 – 2012-11-30 (×7): 250 mg via ORAL
  Filled 2012-11-27 (×8): qty 1

## 2012-11-27 MED ORDER — ISONIAZID 300 MG PO TABS
300.0000 mg | ORAL_TABLET | Freq: Every day | ORAL | Status: DC
  Administered 2012-11-27 – 2012-12-04 (×8): 300 mg via ORAL
  Filled 2012-11-27 (×8): qty 1

## 2012-11-27 MED ORDER — VANCOMYCIN HCL IN DEXTROSE 1-5 GM/200ML-% IV SOLN
1000.0000 mg | Freq: Three times a day (TID) | INTRAVENOUS | Status: DC
  Administered 2012-11-27 – 2012-11-29 (×6): 1000 mg via INTRAVENOUS
  Filled 2012-11-27 (×9): qty 200

## 2012-11-27 NOTE — Progress Notes (Signed)
ANTIBIOTIC CONSULT NOTE - FOLLOW UP  Pharmacy Consult for Imipenem Indication: mediastinal infection  Allergies  Allergen Reactions  . Alendronate Sodium Other (See Comments)    REACTION: chest pain  . Antihistamines, Chlorpheniramine-Type Other (See Comments)    REACTION: "makes lungs bleed"  . Benadryl (Diphenhydramine) Other (See Comments)    REACTION: "makes lungs bleed"  . Nsaids Other (See Comments)    REACTION: ischemic colitis  . Other Other (See Comments)    All anti-inflammatories.  REACTION: ischemic colitis  . Pseudoephedrine Other (See Comments)    REACTION: Ischemic colitis  . Robitussin A-C (Guaifenesin-Codeine) Itching and Other (See Comments)    Palms itching  . Levofloxacin Anxiety    Patient Measurements: Height: 5\' 6"  (167.6 cm) Weight: 111 lb 15.9 oz (50.8 kg) IBW/kg (Calculated) : 59.3   Vital Signs: Temp: 98.3 F (36.8 C) (12/24 0623) Temp src: Oral (12/24 0623) BP: 101/65 mmHg (12/24 0623) Pulse Rate: 89  (12/24 0623) Intake/Output from previous day: 12/23 0701 - 12/24 0700 In: 2427.5 [P.O.:440; I.V.:1687.5; IV Piggyback:300] Out: 900 [Urine:900] Intake/Output from this shift: Total I/O In: 200 [IV Piggyback:200] Out: -   Labs:  Basename 11/27/12 0556 11/26/12 1631  WBC 9.6 14.6*  HGB 9.5* 9.7*  PLT 473* 505*  LABCREA -- --  CREATININE 0.45* 0.41*   Estimated Creatinine Clearance: 64.5 ml/min (by C-G formula based on Cr of 0.45).  Assessment: 56 yo who was admitted for fever. She was recently dx with MAC and has been on therapy with clarithromycin/ethambutol/rifampin. She also c/o chronic diarrhea. Now being r/o for bacterial PNA. Known to pharmacy from previous antibiotic dosing now to switch from Cefepime to Imipenem to cover for pseudomonas, ESBL, and anaerobes. Renal function stable.  Cefepime 12/23>>12/24 Imipenem 12/24>> Vancomycin 12/23 >> Diflucan 12/24>>12/24 Micafungin 12/24>>  12/23 Cdiff negative 12/23  Urine>>  Goal of Therapy:  Appropriate Imipenem dosing  Plan:  1) Imipenem 250mg  IV q6 2) Follow renal function, cultures, clinical status  Fredrik Rigger 11/27/2012,1:26 PM

## 2012-11-27 NOTE — Progress Notes (Signed)
PATIENT DETAILS Name: [name] [name] Age: 56 y.o. Sex: female Date of Birth: 1956-07-17 Admit Date: 11/26/2012 Admitting Physician Standley Brooking, MD [id number] Drue Novel, MD  Subjective: Admitted with right-sided pleuritic chest pain and fever. Claims to have diarrhea for the past few weeks as well. Claims the pleuritic pain is much better. Has been afebrile overnight.  Assessment/Plan: Principal Problem:  *Pleuritic chest pain with fever - CTA of the chest negative for pulmonary embolism - CT of the chest showing a lot of postoperative changes, but also showing a soft tissue density in the posterior mediastinum. Awaiting callback from cardiothoracic surgery, I will discuss and review the CT chest with them to see if this soft tissue density is of any significance - Not sure if this is actually HCAP- will ask infectious disease to assist in managing with antibiotics. Currently on vancomycin and cefepime - Spoke to Dr. Algis Liming, he suggested to placed on airborne isolation in the meantime. 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 Luciana Axe- who presumed that this is a MAC infection (rather than mycobacterium TB- as PPD and Quantiferon were negative)- patient as a result was started on rifampin, ethambutol and Biaxin.  EsophagEAL thickening-seen on CT scan of the chest - No oral thrush - On Diflucan empirically - Change PPI to twice a day  ? UTI -.Doubt she has UTI - On cefepime-await urine cultures  Diarrhea - Stool C. 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 Algis Liming to evaluate   Anemia - Suspect this is chronic-from chronic disease - Monitor H&H periodically - Anemia panel if hemoglobin drops further  GERD - Continue with PPI- but change to twice a day given esophageal thickening seen on the CT  Disposition: Remain inpatient  DVT Prophylaxis: Prophylactic Lovenox   Code Status: Full code  Procedures:  None  CONSULTS:  ID  PHYSICAL EXAM: Vital signs in last 24 hours: Filed Vitals:   11/26/12 2000 11/26/12 2020 11/26/12 2121 11/27/12 0623  BP: 112/69  112/72 101/65  Pulse: 100  88 89  Temp:  98.7 F (37.1 C) 99.5 F (37.5 C) 98.3 F (36.8 C)  TempSrc:  Oral Oral Oral  Resp:   18 18  Height:  5\' 6"  (1.676 m) 5\' 6"  (1.676 m)   Weight:  49.896 kg (110 lb) 50.8 kg (111 lb 15.9 oz)   SpO2: 100%  96% 99%    Weight change:  Body mass index is 18.08 kg/(m^2).   Gen Exam: Awake and alert with clear speech.   Neck: Supple, No JVD.   Chest: B/L Clear.   CVS: S1 S2 Regular, no murmurs.  Abdomen: soft, BS +, non tender, non distended.  Extremities: no edema, lower extremities warm to touch. Neurologic: Non Focal.   Skin: No Rash.   Wounds: N/A.    Intake/Output from previous day:  Intake/Output Summary (Last 24 hours) at 11/27/12 1145 Last data filed at 11/27/12 0801  Gross per 24 hour  Intake 2627.5 ml  Output    900 ml  Net 1727.5 ml  LAB RESULTS: CBC  Lab 11/27/12 0556 11/26/12 1631  WBC 9.6 14.6*  HGB 9.5* 9.7*  HCT 29.6* 29.3*  PLT 473* 505*  MCV 96.7 95.8  MCH 31.0 31.7  MCHC 32.1 33.1  RDW 13.6 13.5  LYMPHSABS -- 0.6*  MONOABS -- 1.0  EOSABS -- 0.0  BASOSABS -- 0.0  BANDABS -- --    Chemistries   Lab 11/27/12 0556 11/26/12 1631  NA 135 132*  K 3.4* 3.8  CL 99 97  CO2 24 20  GLUCOSE 114* 117*  BUN 4* 4*  CREATININE 0.45* 0.41*  CALCIUM 8.8 8.7  MG -- --    CBG: No results found for this basename: GLUCAP:5 in the last 168  hours  GFR Estimated Creatinine Clearance: 64.5 ml/min (by C-G formula based on Cr of 0.45).  Coagulation profile No results found for this basename: INR:5,PROTIME:5 in the last 168 hours  Cardiac Enzymes No results found for this basename: CK:3,CKMB:3,TROPONINI:3,MYOGLOBIN:3 in the last 168 hours  No components found with this basename: POCBNP:3 No results found for this basename: DDIMER:2 in the last 72 hours No results found for this basename: HGBA1C:2 in the last 72 hours No results found for this basename: CHOL:2,HDL:2,LDLCALC:2,TRIG:2,CHOLHDL:2,LDLDIRECT:2 in the last 72 hours No results found for this basename: TSH,T4TOTAL,FREET3,T3FREE,THYROIDAB in the last 72 hours No results found for this basename: VITAMINB12:2,FOLATE:2,FERRITIN:2,TIBC:2,IRON:2,RETICCTPCT:2 in the last 72 hours No results found for this basename: LIPASE:2,AMYLASE:2 in the last 72 hours  Urine Studies No results found for this basename: UACOL:2,UAPR:2,USPG:2,UPH:2,UTP:2,UGL:2,UKET:2,UBIL:2,UHGB:2,UNIT:2,UROB:2,ULEU:2,UEPI:2,UWBC:2,URBC:2,UBAC:2,CAST:2,CRYS:2,UCOM:2,BILUA:2 in the last 72 hours  MICROBIOLOGY: Recent Results (from the past 240 hour(s))  CLOSTRIDIUM DIFFICILE BY PCR     Status: Normal   Collection Time   11/26/12 10:32 PM      Component Value Range Status Comment   C difficile by pcr NEGATIVE  NEGATIVE Final     RADIOLOGY STUDIES/RESULTS: Dg Chest 2 View  11/26/2012  *RADIOLOGY REPORT*  Clinical Data: Fever  CHEST - 2 VIEW  Comparison: 09/2012  Findings: Interstitial prominence.  Bronchitic changes.  Increased AP diameter of the chest.  Mild cardiomegaly.  Normal pulmonary vascularity.  Loculated right apical hydropneumothorax has improved.  Heterogeneous opacities throughout the right mid and lower lung zones have improved.  Minimal residual subsegmental atelectasis at the left base.  IMPRESSION: Improved loculated right hydropneumothorax.  Improved heterogeneous pulmonary opacities in the  right mid and lower lung zones.   Original Report Authenticated By: Jolaine Click, M.D.    Dg Chest 2 View  11/16/2012  *RADIOLOGY REPORT*  Clinical Data: Pneumonia  CHEST - 2 VIEW  Comparison: Yesterday  Findings: Right chest tube has been removed.  Stable right hydropneumothorax.  Opacities within the right lower lung zone and right suprahilar density are stable.  Interstitial prominence throughout the left lung with hyperaeration is stable.  No left pneumothorax.  Normal heart size.  IMPRESSION: Stable right hydropneumothorax after chest tube removal.   Original Report Authenticated By: Jolaine Click, M.D.    Dg Chest 2 View  11/15/2012  *RADIOLOGY REPORT*  Clinical Data: Shortness of breath, follow up pneumonia  CHEST - 2 VIEW  Comparison: Portable chest x-ray of 11/14/2012 and 11/13/2012  Findings: Aeration has improved slightly.  There is still right apical pneumothorax present with right chest tube unchanged in position.  Right perihilar opacity remains although opacity noted previously at the right lung base has improved.  The heart is within normal limits in size.  IMPRESSION:  1.  Stable right apical pneumothorax with right chest  tube remaining. 2.  Some improvement in opacity at the right lung base with haziness overlying the right hilum remaining.   Original Report Authenticated By: Dwyane Dee, M.D.    Dg Chest 2 View  11/02/2012  *RADIOLOGY REPORT*  Clinical Data: Preop.  CHEST - 2 VIEW  Comparison: 08/12/2012  Findings: Right apical pleural/parenchymal density and cavitation again noted, which may be slightly progressed since prior study. Left apical scarring noted.  Heart is normal size.  No effusions. No acute bony abnormality.  IMPRESSION: Right apical density may have progressed slightly since prior study.   Original Report Authenticated By: Charlett Nose, M.D.    Ct Chest W Contrast  11/15/2012  *RADIOLOGY REPORT*  Clinical Data: Fever and right chest pain.  Status post right lobectomy.   Smoker.  CT CHEST WITH CONTRAST  Technique:  Multidetector CT imaging of the chest was performed following the standard protocol during bolus administration of intravenous contrast.  Contrast: 80mL OMNIPAQUE IOHEXOL 300 MG/ML  SOLN  Comparison: Chestradiographs obtained earlier today and chest CT dated 08/15/2012.  Findings: Interval resection of the cavitary mass in the right upper lobe with multiple surgical staple lines and surgical clips. Approximately 20% right hydropneumothorax.  A portion of this is located at the apex and another portion is posteromedially located and appears partially loculated.  No fluid collections with peripheral rim enhancement are seen.  There is minimal atelectasis at both lung bases.  Bullous changes in the left lung are again demonstrated, remaining most pronounced at the medial aspect of the left upper lobe.  No lung masses or enlarged lymph nodes are seen.  A 1 cm cyst in the right lobe of the liver is unchanged.  Mild thoracic spine degenerative changes.  IMPRESSION:  1.  Right postoperative changes with an approximately 20% right hydropneumothorax. 2.  No abscess or evidence of lung empyema. 3.  Minimal bibasilar atelectasis. 4.  Stable changes of COPD in the left lung.   Original Report Authenticated By: Beckie Salts, M.D.    Ct Angio Chest Pe W/cm &/or Wo Cm  11/27/2012  *RADIOLOGY REPORT*  Clinical Data: Pleuritic chest pain, worsening shortness of breath and fever.  History of right upper lobectomy and MAC.  CT ANGIOGRAPHY CHEST  Technique:  Multidetector CT imaging of the chest using the standard protocol during bolus administration of intravenous contrast. Multiplanar reconstructed images including MIPs were obtained and reviewed to evaluate the vascular anatomy.  Contrast: 80mL OMNIPAQUE IOHEXOL 350 MG/ML SOLN  Comparison: CT of the chest performed 11/15/2012  Findings: There is no evidence of significant pulmonary embolus. Evaluation for pulmonary embolus is mildly  suboptimal due to motion artifact.  There is a persistent small right-sided hydropneumothorax, most prominent at the right lung apex; this is grossly unchanged in appearance from the prior study.  Extensive postoperative change is noted along the right side of the mediastinum, with associated apparent dense sutures and underlying atelectasis.  Significant associated soft tissue density is of uncertain significance, tracking inferiorly along the posterior mediastinum.  The esophagus is obscured due to surrounding soft tissue attenuation.  The degree of soft tissue inflammation about the esophagus appears significantly worsened from the prior study; this could conceivably reflect diffuse esophagitis.  Previously noted dense airspace opacification within the inferior right middle lobe has improved.  There is a persistent somewhat loculated small right-sided pleural effusion; previously noted air components within the effusion are less prominent.  Scattered blebs are seen at the left lung apex, and underlying emphysema  is noted throughout both lungs, more prominent at the lung apices.  The mediastinum is otherwise grossly unremarkable, though the significance of diffuse soft tissue attenuation along the right side of the mediastinum and posterior mediastinum is uncertain.  No pericardial effusion is identified.  The great vessels are grossly unremarkable in appearance.  No definite mediastinal lymphadenopathy is seen.  No axillary lymphadenopathy is seen.  The thyroid gland is unremarkable in appearance.  The visualized portions of the liver and spleen are unremarkable.  No acute osseous abnormalities are seen.  Chronic right-sided rib deformity is noted.  IMPRESSION:  1.  No evidence of significant pulmonary embolus. 2.  Extensive postoperative change again noted along the right side of the mediastinum, with associated soft tissue density tracking inferiorly along the posterior mediastinum, of uncertain significance.   The esophagus is obscured due to surrounding soft tissue attenuation.  Would correlate for history of malignancy; this could simply reflect postoperative change.  The degree of soft tissue inflammation about the esophagus appears significantly worsened from the recent prior study.  This could conceivably reflect diffuse esophagitis.  3.  Persistent small right-sided hydropneumothorax, most prominent at the right lung apex.  Loculated components noted along the right lung. 4.  Previously noted dense consolidation within the inferior right middle lung lobe has improved. 5.  Scattered underlying emphysema noted, with blebs seen at the left lung apex. 6.  Chronic right-sided rib deformity again noted.   Original Report Authenticated By: Tonia Ghent, M.D.    Dg Chest Port 1 View  11/14/2012  *RADIOLOGY REPORT*  Clinical Data: Right lower lobe pneumonia.  Status post VATS.  PORTABLE CHEST - 1 VIEW  Comparison: 11/13/2012  Findings: 0616 hours right chest tube remains in place.  The apical component of the pneumothorax is still visible and not substantially changed in the interval.  Some mild bibasilar atelectasis or infiltrate persist.  No overt pulmonary edema. Heart size is stable. Telemetry leads overlie the chest.  IMPRESSION: Stable exam.   Original Report Authenticated By: Kennith Center, M.D.    Dg Chest Port 1 View  11/13/2012  *RADIOLOGY REPORT*  Clinical Data: Right-sided chest tube  PORTABLE CHEST - 1 VIEW  Comparison: 11/12/2012; 11/07/2012; 08/12/2012; chest CT - 08/15/2012  Findings:  Grossly unchanged cardiac silhouette and mediastinal contours given reduced lung volumes of patient rotation.  Interval removal of right jugular approach central venous catheter and the more inferior and laterally positioned right-sided chest tube.  The apically directed right-sided chest tube is unchanged in position. Grossly unchanged small right apical pneumothorax.  Heterogeneous air space opacities within the  remaining right lung are grossly unchanged.  Grossly unchanged left basilar heterogeneous opacities. No new focal airspace opacity.  Query trace right-sided effusion, likely unchanged.  Unchanged bones.  IMPRESSION: 1.  Interval removal of support apparatus with unchanged positioning of remaining apically directed right-sided chest tube. Grossly unchanged small residual right-sided hydropneumothorax.  2.  Grossly unchanged heterogeneous air space opacities within the remaining right lung   Original Report Authenticated By: Tacey Ruiz, MD    Dg Chest Port 1 View  11/12/2012  *RADIOLOGY REPORT*  Clinical Data: Follow-up lobectomy.  PORTABLE CHEST - 1 VIEW  Comparison: 11/11/2012  Findings: Upper and lower right-sided chest tubes remain  in good position.  Approximately 10% right apical pneumothorax unchanged. Slight left basilar opacity may be minimally improved.  Central venous catheter tip unchanged at cavoatrial junction. Cardiomegaly.  Moderate airspace disease right lung stable, with right hemithorax volume  loss.  IMPRESSION: Stable right apical pneumothorax (10%).  Slight improvement left basilar aeration.   Original Report Authenticated By: Davonna Belling, M.D.    Dg Chest Port 1 View  11/11/2012  *RADIOLOGY REPORT*  Clinical Data: 56 year old female - postoperative right lobectomy for cavitary lesion.  PORTABLE CHEST - 1 VIEW  Comparison: 11/10/2012 and prior chest radiographs dating back to 2009.  Findings: Right thoracic postoperative changes are again identified. Two separate right thoracostomy tubes and a right IJ central venous catheter with tip overlying the cavoatrial junction again noted. Right hemithorax volume loss is present with airspace opacities within the remaining right lung, slightly improved since the prior study. A small (10%) right apical pneumothorax is unchanged. Mild left basilar opacity is again noted question atelectasis versus developing airspace disease.  IMPRESSION: Improved  right lung aeration with continued small right apical pneumothorax (10%).  Mild left basilar opacity again noted which may represent atelectasis or developing airspace disease/pneumonia.  Stable support apparatus as described.   Original Report Authenticated By: Harmon Pier, M.D.    Dg Chest Port 1 View  11/10/2012  *RADIOLOGY REPORT*  Clinical Data: Evaluate right chest tubes.  PORTABLE CHEST - 1 VIEW  Comparison: 11/09/2012  Findings: Stable position of the right chest drains.  There is stable right apical pneumothorax that roughly measures 15%. Persistent parenchymal densities throughout the right lung.  Left lung is clear with exception of left basilar atelectasis.  Heart and mediastinum are stable.  The jugular central venous catheter is in the lower SVC region.  IMPRESSION: No significant change in the size of the right pneumothorax. Stable patchy densities throughout the right lung.  New patchy densities at the left lung may represent atelectasis.   Original Report Authenticated By: Richarda Overlie, M.D.    Dg Chest Port 1 View  11/09/2012  *RADIOLOGY REPORT*  Clinical Data: Chest tubes.  Pneumothorax.  PORTABLE CHEST - 1 VIEW  Comparison: Chest 11/08/2012.  Findings: Two right chest tubes and right IJ catheter remain in place.  Small right apical pneumothorax is unchanged in appearance. There has been increase in right pleural effusion.  Airspace disease throughout the right chest appears increased.  Volume loss on the right with shift of the mediastinal contents again from left to right is again seen.  Left lung is clear.  Cardiac silhouette is partially obscured.  IMPRESSION:  1.  No change in a small right pneumothorax with a chest tube in place. 2.  Increased right effusion and airspace disease.   Original Report Authenticated By: Holley Dexter, M.D.    Dg Chest Port 1 View  11/08/2012  *RADIOLOGY REPORT*  Clinical Data: Post thoracotomy and lobectomy, evaluate pneumothorax  PORTABLE CHEST - 1 VIEW   Comparison: 11/07/2012; 11/06/2012; 11/05/2012  Findings:  Grossly unchanged cardiac silhouette and mediastinal contours with persistent deviation of the cardiomediastinal structures into the right hemithorax.  Stable position of support apparatus.  No change to minimal decrease in small to moderate-sized right-sided hydropneumothorax.  Worsening heterogeneous opacities in the remaining right lung.  The left hemithorax is grossly unchanged with minimal left basilar opacities favored to represent atelectasis.  Unchanged bones.  IMPRESSION: 1.  Stable positioning of support apparatus.  No change to minimal decrease in small to moderate-sized right-sided hydropneumothorax. 2.  Worsening heterogeneous opacities within the remaining right lung with differential considerations including atelectasis, edema or developing infection.  Continued attention on follow-up is recommended.   Original Report Authenticated By: Tacey Ruiz, MD    Dg  Chest Port 1 View  11/07/2012  *RADIOLOGY REPORT*  Clinical Data: Status post right lobectomy.  PORTABLE CHEST - 1 VIEW  Comparison: One-view chest 11/06/2012.  Findings: The heart size is normal.  The right apical pneumothorax has increased in size.  Lung density has increased on the right as well.  The chest tubes are stable.  Minimal atelectasis is present at the left base.  IMPRESSION:  1.  Increasing right apical pneumothorax. 2.  Increasing consolidation of the residual right lung. 3.  The support apparatus is stable.   Original Report Authenticated By: Marin Roberts, M.D.    Dg Chest Port 1 View  11/06/2012  *RADIOLOGY REPORT*  Clinical Data: VATS.  PORTABLE CHEST - 1 VIEW  Comparison: 11/05/2012  Findings: Two right chest tubes remain in place.  Postoperative changes in the right lung.  Suspect small residual right apical pneumothorax although the pleural edges difficult to visualize today's study.  Lucency around the superior chest tube may reflect a small residual  pneumothorax.  No confluent airspace opacities in the lungs.  Heart is normal size.  Subcutaneous air in the right chest wall, slightly increased.  IMPRESSION: Two right chest tube remain in place.  Suspect tiny residual right apical pneumothorax around the superior chest tube.   Original Report Authenticated By: Charlett Nose, M.D.    Dg Chest Portable 1 View  11/05/2012  *RADIOLOGY REPORT*  Clinical Data: Postop.  Status post VATS.  PORTABLE CHEST - 1 VIEW  Comparison: 11/02/2012  Findings: Lungs are hyperinflated.  Right-sided IJ central line tip overlies the level of the superior vena cava.  Dual right-sided chest tubes are in place.  There is a small right apical pneumothorax, associated with apical pleural thickening and apical lung sutures.  Perihilar bronchitic changes are again noted.  No evidence for pulmonary edema.  IMPRESSION:  1.  Postoperative appearance of the chest. 2.  Small right apical pneumothorax.   Original Report Authenticated By: Norva Pavlov, M.D.     MEDICATIONS: Scheduled Meds:   . antiseptic oral rinse  15 mL Mouth Rinse BID  . ceFEPime (MAXIPIME) IV  1 g Intravenous Q8H  . clarithromycin  1,000 mg Oral Q supper  . enoxaparin (LOVENOX) injection  40 mg Subcutaneous Q24H  . ethambutol  800 mg Oral Daily  . fluconazole  100 mg Oral Daily  . pantoprazole  40 mg Oral Daily  . rifampin  600 mg Oral Daily  . sodium chloride  3 mL Intravenous Q12H  . vancomycin  1,000 mg Intravenous Q8H   Continuous Infusions:  PRN Meds:.acetaminophen, acetaminophen, alum & mag hydroxide-simeth, nicotine polacrilex, ondansetron (ZOFRAN) IV, ondansetron, oxyCODONE, traMADol  Antibiotics: Anti-infectives     Start     Dose/Rate Route Frequency Ordered Stop   11/27/12 1000   fluconazole (DIFLUCAN) tablet 100 mg        100 mg Oral Daily 11/26/12 2048     11/27/12 1000   rifampin (RIFADIN) capsule 600 mg        600 mg Oral Daily 11/26/12 2048     11/27/12 0700   vancomycin (VANCOCIN)  IVPB 1000 mg/200 mL premix        1,000 mg 200 mL/hr over 60 Minutes Intravenous Every 8 hours 11/26/12 2042     11/26/12 2200   clarithromycin (BIAXIN) tablet 1,000 mg        1,000 mg Oral Daily with supper 11/26/12 2048     11/26/12 2200   ethambutol (MYAMBUTOL) tablet 800 mg  800 mg Oral Daily 11/26/12 2048     11/26/12 2200   ceFEPIme (MAXIPIME) 1 g in dextrose 5 % 50 mL IVPB        1 g 100 mL/hr over 30 Minutes Intravenous 3 times per day 11/26/12 2042 12/04/12 2159   11/26/12 2030   vancomycin (VANCOCIN) IVPB 1000 mg/200 mL premix        1,000 mg 200 mL/hr over 60 Minutes Intravenous  Once 11/26/12 2028 11/27/12 0029   11/26/12 1830   cefTRIAXone (ROCEPHIN) 1 g in dextrose 5 % 50 mL IVPB  Status:  Discontinued        1 g 100 mL/hr over 30 Minutes Intravenous Every 24 hours 11/26/12 1816 11/26/12 2048           Jeoffrey Massed, MD  [facility] Pager:[phone]  If 7PM-7AM, please contact night-coverage [email or web address] Password [email or web address] 11/27/2012, 11:45 AM   LOS: 1 day

## 2012-11-27 NOTE — Progress Notes (Signed)
VASCULAR LAB PRELIMINARY  PRELIMINARY  PRELIMINARY  PRELIMINARY  Bilateral lower extremity venous duplex  completed.    Preliminary report:  Bilateral:  No evidence of DVT, superficial thrombosis, or Baker's Cyst.   Yazhini Mcaulay, RVT 11/27/2012, 1:00 PM

## 2012-11-27 NOTE — Progress Notes (Signed)
During the day, patient started developing odynophagia. Have changed her to a full liquid diet. I have asked for a GI consultation as well-spoke with [facility]. I have also spoken with Dr. Morton Peters who will also evaluate, but did not think the CT scan of the chest findings were significant. Patient was very anxious at times, and wanted more information about DO NOT RESUSCITATE status, she will talk with her sister and decide further. She continues to be a full code.

## 2012-11-27 NOTE — Progress Notes (Signed)
Patient active with [facility] with [facility] Care Management. She was recently assigned to a [facility] Care Manager and she is currently active with [facility]. Will continue to follow and assist as needed. [facility] Care Management services do not interfere or replace any services arranged by inpatient case management.   Raiford Noble, MSN-Ed, RN, BSN [facility] Liaison [phone]

## 2012-11-27 NOTE — Consult Note (Addendum)
[facility]    Date of Admission:  11/26/2012  Date of Consult:  11/27/2012  Reason for Consult:Fever pleurtic chest pain Referring Physician:Dr. Jerral Ralph   HPI: [name] [name] is an 56 y.o. 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 Patient sees Dr. Drue Novel as her PCP who ordered a CXR and subsequent CT chest when she presented to him with right-sided pleuritic chest pain. CXR and CT showed a cavitary lesion in the right upper lobe. Patient was referred to Dr. 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 Patient saw Dr. Laneta Simmers in may who suggested a lobectomy of abscess. Patient sought second opinion at [facility], who recommended same treatment. Patient denied surgery due to fear of losing her job. She continued to have recurrent infections that she states were best treated with avelox. She later agreed to surgery, which was done on 11/05/12 with a right upper lobectomy and right lower lobe superior segmentectomy. Pathology showed necrotizing granulomas and culture initially only grew  Candida, but no pathogenic organisms grew. After discharge though, the AFB culture (smear negative) and she was brought in to see my partner Dr. 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 (her ppd and QFeron were negative and her felt her risk factors for MTB were VERY low). The interim she has developed high fevers and severe pleuritic chest pain. She was brought to the emergency department via EMS and ER had a CT angiogram which failed to show pulmonary embolism but which it did show "Extensive postoperative change again along the right side of the mediastinum, with associated soft tissue density tracking  inferiorly along the posterior mediastinum." She  was started on vancomycin and cefepime for possible healthcare associated pneumonia. She was also continued on for, which he apparently been taking at home along with her clarithromycin rifampin and ethambutol. She has had loose bowel movements but C. Difficile PCR is here is negative. Consult assist in the workup and management this patient with history of granulomatous lung abscess status post lobectomy now with fevers despite therapy with ethambutol rifampin and clarithromycin for resistant Mycobacterium avium infection. She now has worsening of her angina long esophagus in the postoperative area that are bothersome, though radiology feels it could be due to esophagitis.    Past Medical History  Diagnosis Date  . COPD (chronic obstructive pulmonary disease)     per CT 11/10/05, will minimal bronchiectasis rll  . GERD (gastroesophageal reflux disease)   . Osteoporosis     dexa per gyn  . 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 Headache   . Lichen planus   . B12 deficiency   . Endometriosis   . Cataracts, bilateral   . Arthritis   . Lung mass   . Asthma     .  years ago- has not had problem for years.  . Pneumonia     last ime 2008  . Hemorrhoid   . Pneumothorax, spontaneous, tension     x 2 in her 20's.  has a chest tube for one    Past Surgical History  Procedure Date  . Knee surgery     .not replacement .  Marland Kitchen Ovarian cyst removed   . Video bronchoscopy 02/15/2012    Procedure: VIDEO BRONCHOSCOPY  WITH FLUORO;  Surgeon: Nyoka Cowden, MD;  Location: [facility];  Service: Endoscopy;  Laterality: Bilateral;  WASHING- INTERVENTION BIOPSIES INTERVENTION BRONCHOSCOPY WITH VIDEO  . Mediastinoscopy 05/25/2012    Procedure: MEDIASTINOSCOPY;  Surgeon: Alleen Borne, MD;  Location: [facility] OR;  Service: Thoracic;  Laterality: N/A;  . Tonsillectomy   . Flexible bronchoscopy 11/05/2012    Procedure: FLEXIBLE BRONCHOSCOPY;  Surgeon: Alleen Borne, MD;  Location: [facility] OR;  Service:  Thoracic;  Laterality: N/A;  . Thoracotomy 11/05/2012    Procedure: THORACOTOMY MAJOR;  Surgeon: Alleen Borne, MD;  Location: [facility] OR;  Service: Thoracic;  Laterality: Right;  . Lobectomy 11/05/2012    Procedure: LOBECTOMY;  Surgeon: Alleen Borne, MD;  Location: [facility] OR;  Service: Thoracic;  Laterality: Right;  right upper lobectomy  ergies:   Allergies  Allergen Reactions  . Alendronate Sodium Other (See Comments)    REACTION: chest pain  . Antihistamines, Chlorpheniramine-Type Other (See Comments)    REACTION: "makes lungs bleed"  . Benadryl (Diphenhydramine) Other (See Comments)    REACTION: "makes lungs bleed"  . Nsaids Other (See Comments)    REACTION: ischemic colitis  . Other Other (See Comments)    All anti-inflammatories.  REACTION: ischemic colitis  . Pseudoephedrine Other (See Comments)    REACTION: Ischemic colitis  . Robitussin A-C (Guaifenesin-Codeine) Itching and Other (See Comments)    Palms itching  . Levofloxacin Anxiety     Medications: I have reviewed patients current medications as documented in Epic Anti-infectives     Start     Dose/Rate Route Frequency Ordered Stop   11/27/12 1300   micafungin (MYCAMINE) 150 mg in sodium chloride 0.9 % 100 mL IVPB        150 mg 100 mL/hr over 1 Hours Intravenous Daily 11/27/12 1251     11/27/12 1300   azithromycin (ZITHROMAX) tablet 500 mg        500 mg Oral Daily 11/27/12 1252     11/27/12 1000   fluconazole (DIFLUCAN) tablet 100 mg  Status:  Discontinued        100 mg Oral Daily 11/26/12 2048 11/27/12 1248   11/27/12 1000   rifampin (RIFADIN) capsule 600 mg        600 mg Oral Daily 11/26/12 2048     11/27/12 0700   vancomycin (VANCOCIN) IVPB 1000 mg/200 mL premix  Status:  Discontinued        1,000 mg 200 mL/hr over 60 Minutes Intravenous Every 8 hours 11/26/12 2042 11/27/12 1248   11/26/12 2200   clarithromycin (BIAXIN) tablet 1,000 mg  Status:  Discontinued        1,000 mg Oral Daily with supper 11/26/12 2048  11/27/12 1248   11/26/12 2200   ethambutol (MYAMBUTOL) tablet 800 mg        800 mg Oral Daily 11/26/12 2048     11/26/12 2200   ceFEPIme (MAXIPIME) 1 g in dextrose 5 % 50 mL IVPB  Status:  Discontinued        1 g 100 mL/hr over 30 Minutes Intravenous 3 times per day 11/26/12 2042 11/27/12 1248   11/26/12 2030   vancomycin (VANCOCIN) IVPB 1000 mg/200 mL premix        1,000 mg 200 mL/hr over 60 Minutes Intravenous  Once 11/26/12 2028 11/27/12 0029   11/26/12 1830   cefTRIAXone (ROCEPHIN) 1 g in dextrose 5 % 50 mL IVPB  Status:  Discontinued        1  g 100 mL/hr over 30 Minutes Intravenous Every 24 hours 11/26/12 1816 11/26/12 2048          Social History:  reports that she has quit smoking. Her smoking use included Cigarettes. She has a 38 pack-year smoking history. She has never used smokeless tobacco. She reports that she does not drink alcohol or use illicit drugs.  Family History  Problem Relation Age of Onset  . Colon cancer Neg Hx   . Breast cancer Neg Hx   . Diabetes Mother     GM  . Heart attack      ?  Marland Kitchen Heart disease Mother   . Heart disease Maternal Grandfather   . Cancer Father     sarcoma    As in HPI and primary teams notes otherwise 12 point review of systems is negative  Blood pressure 101/65, pulse 89, temperature 98.3 F (36.8 C), temperature source Oral, resp. rate 18, height 5\' 6"  (1.676 m), weight 111 lb 15.9 oz (50.8 kg), last menstrual period 11/05/2004, SpO2 99.00%. General: Alert and awake, oriented x3, not in any acute distress. HEENT: anicteric sclera, pupils reactive to light and accommodation, EOMI, oropharynx clear and without exudate CVS regular rate, normal r,  no murmur rubs or gallops Chest: clear to auscultation bilaterally, no wheezing, rales or rhonchi Abdomen: soft nontender, nondistended, normal bowel sounds, Extremities: no  clubbing or edema noted bilaterally Skin: no rashes Neuro: nonfocal, strength and sensation  intact   Results for orders placed during the hospital encounter of 11/26/12 (from the past 48 hour(s))  CBC WITH DIFFERENTIAL     Status: Abnormal   Collection Time   11/26/12  4:31 PM      Component Value Range Comment   WBC 14.6 (*) 4.0 - 10.5 K/uL    RBC 3.06 (*) 3.87 - 5.11 MIL/uL    Hemoglobin 9.7 (*) 12.0 - 15.0 g/dL    HCT 82.9 (*) 56.2 - 46.0 %    MCV 95.8  78.0 - 100.0 fL    MCH 31.7  26.0 - 34.0 pg    MCHC 33.1  30.0 - 36.0 g/dL    RDW 13.0  86.5 - 78.4 %    Platelets 505 (*) 150 - 400 K/uL    Neutrophils Relative 89 (*) 43 - 77 %    Neutro Abs 13.0 (*) 1.7 - 7.7 K/uL    Lymphocytes Relative 4 (*) 12 - 46 %    Lymphs Abs 0.6 (*) 0.7 - 4.0 K/uL    Monocytes Relative 7  3 - 12 %    Monocytes Absolute 1.0  0.1 - 1.0 K/uL    Eosinophils Relative 0  0 - 5 %    Eosinophils Absolute 0.0  0.0 - 0.7 K/uL    Basophils Relative 0  0 - 1 %    Basophils Absolute 0.0  0.0 - 0.1 K/uL   COMPREHENSIVE METABOLIC PANEL     Status: Abnormal   Collection Time   11/26/12  4:31 PM      Component Value Range Comment   Sodium 132 (*) 135 - 145 mEq/L    Potassium 3.8  3.5 - 5.1 mEq/L    Chloride 97  96 - 112 mEq/L    CO2 20  19 - 32 mEq/L    Glucose, Bld 117 (*) 70 - 99 mg/dL    BUN 4 (*) 6 - 23 mg/dL    Creatinine, Ser 6.96 (*) 0.50 - 1.10 mg/dL  Calcium 8.7  8.4 - 10.5 mg/dL    Total Protein 7.1  6.0 - 8.3 g/dL    Albumin 2.5 (*) 3.5 - 5.2 g/dL    AST 18  0 - 37 U/L    ALT 12  0 - 35 U/L    Alkaline Phosphatase 167 (*) 39 - 117 U/L    Total Bilirubin 0.5  0.3 - 1.2 mg/dL    GFR calc non Af Amer >90  >90 mL/min    GFR calc Af Amer >90  >90 mL/min   URINALYSIS, ROUTINE W REFLEX MICROSCOPIC     Status: Abnormal   Collection Time   11/26/12  5:15 PM      Component Value Range Comment   Color, Urine AMBER (*) YELLOW BIOCHEMICALS MAY BE AFFECTED BY COLOR   APPearance CLEAR  CLEAR    Specific Gravity, Urine 1.015  1.005 - 1.030    pH 7.0  5.0 - 8.0    Glucose, UA NEGATIVE   NEGATIVE mg/dL    Hgb urine dipstick NEGATIVE  NEGATIVE    Bilirubin Urine SMALL (*) NEGATIVE    Ketones, ur 15 (*) NEGATIVE mg/dL    Protein, ur NEGATIVE  NEGATIVE mg/dL    Urobilinogen, UA 4.0 (*) 0.0 - 1.0 mg/dL    Nitrite POSITIVE (*) NEGATIVE    Leukocytes, UA SMALL (*) NEGATIVE   URINE MICROSCOPIC-ADD ON     Status: Abnormal   Collection Time   11/26/12  5:15 PM      Component Value Range Comment   Squamous Epithelial / LPF FEW (*) RARE    WBC, UA 3-6  <3 WBC/hpf    RBC / HPF 0-2  <3 RBC/hpf    Bacteria, UA RARE  RARE   CLOSTRIDIUM DIFFICILE BY PCR     Status: Normal   Collection Time   11/26/12 10:32 PM      Component Value Range Comment   C difficile by pcr NEGATIVE  NEGATIVE   BASIC METABOLIC PANEL     Status: Abnormal   Collection Time   11/27/12  5:56 AM      Component Value Range Comment   Sodium 135  135 - 145 mEq/L    Potassium 3.4 (*) 3.5 - 5.1 mEq/L    Chloride 99  96 - 112 mEq/L    CO2 24  19 - 32 mEq/L    Glucose, Bld 114 (*) 70 - 99 mg/dL    BUN 4 (*) 6 - 23 mg/dL    Creatinine, Ser 9.14 (*) 0.50 - 1.10 mg/dL    Calcium 8.8  8.4 - 78.2 mg/dL    GFR calc non Af Amer >90  >90 mL/min    GFR calc Af Amer >90  >90 mL/min   CBC     Status: Abnormal   Collection Time   11/27/12  5:56 AM      Component Value Range Comment   WBC 9.6  4.0 - 10.5 K/uL    RBC 3.06 (*) 3.87 - 5.11 MIL/uL    Hemoglobin 9.5 (*) 12.0 - 15.0 g/dL    HCT 95.6 (*) 21.3 - 46.0 %    MCV 96.7  78.0 - 100.0 fL    MCH 31.0  26.0 - 34.0 pg    MCHC 32.1  30.0 - 36.0 g/dL    RDW 08.6  57.8 - 46.9 %    Platelets 473 (*) 150 - 400 K/uL       Component Value Date/Time  SDES SPUTUM 11/12/2012 2159   SDES SPUTUM 11/12/2012 2159   SPECREQUEST NONE 11/12/2012 2159   SPECREQUEST ADDED 11/13/12 11/12/2012 2159   CULT ABUNDANT CANDIDA ALBICANS 11/12/2012 2159   REPTSTATUS 11/13/2012 FINAL 11/12/2012 2159   REPTSTATUS 11/15/2012 FINAL 11/12/2012 2159   Dg Chest 2 View  11/26/2012  *RADIOLOGY  REPORT*  Clinical Data: Fever  CHEST - 2 VIEW  Comparison: 09/2012  Findings: Interstitial prominence.  Bronchitic changes.  Increased AP diameter of the chest.  Mild cardiomegaly.  Normal pulmonary vascularity.  Loculated right apical hydropneumothorax has improved.  Heterogeneous opacities throughout the right mid and lower lung zones have improved.  Minimal residual subsegmental atelectasis at the left base.  IMPRESSION: Improved loculated right hydropneumothorax.  Improved heterogeneous pulmonary opacities in the right mid and lower lung zones.   Original Report Authenticated By: Jolaine Click, M.D.    Ct Angio Chest Pe W/cm &/or Wo Cm  11/27/2012  *RADIOLOGY REPORT*  Clinical Data: Pleuritic chest pain, worsening shortness of breath and fever.  History of right upper lobectomy and MAC.  CT ANGIOGRAPHY CHEST  Technique:  Multidetector CT imaging of the chest using the standard protocol during bolus administration of intravenous contrast. Multiplanar reconstructed images including MIPs were obtained and reviewed to evaluate the vascular anatomy.  Contrast: 80mL OMNIPAQUE IOHEXOL 350 MG/ML SOLN  Comparison: CT of the chest performed 11/15/2012  Findings: There is no evidence of significant pulmonary embolus. Evaluation for pulmonary embolus is mildly suboptimal due to motion artifact.  There is a persistent small right-sided hydropneumothorax, most prominent at the right lung apex; this is grossly unchanged in appearance from the prior study.  Extensive postoperative change is noted along the right side of the mediastinum, with associated apparent dense sutures and underlying atelectasis.  Significant associated soft tissue density is of uncertain significance, tracking inferiorly along the posterior mediastinum.  The esophagus is obscured due to surrounding soft tissue attenuation.  The degree of soft tissue inflammation about the esophagus appears significantly worsened from the prior study; this could  conceivably reflect diffuse esophagitis.  Previously noted dense airspace opacification within the inferior right middle lobe has improved.  There is a persistent somewhat loculated small right-sided pleural effusion; previously noted air components within the effusion are less prominent.  Scattered blebs are seen at the left lung apex, and underlying emphysema is noted throughout both lungs, more prominent at the lung apices.  The mediastinum is otherwise grossly unremarkable, though the significance of diffuse soft tissue attenuation along the right side of the mediastinum and posterior mediastinum is uncertain.  No pericardial effusion is identified.  The great vessels are grossly unremarkable in appearance.  No definite mediastinal lymphadenopathy is seen.  No axillary lymphadenopathy is seen.  The thyroid gland is unremarkable in appearance.  The visualized portions of the liver and spleen are unremarkable.  No acute osseous abnormalities are seen.  Chronic right-sided rib deformity is noted.  IMPRESSION:  1.  No evidence of significant pulmonary embolus. 2.  Extensive postoperative change again noted along the right side of the mediastinum, with associated soft tissue density tracking inferiorly along the posterior mediastinum, of uncertain significance.  The esophagus is obscured due to surrounding soft tissue attenuation.  Would correlate for history of malignancy; this could simply reflect postoperative change.  The degree of soft tissue inflammation about the esophagus appears significantly worsened from the recent prior study.  This could conceivably reflect diffuse esophagitis.  3.  Persistent small right-sided hydropneumothorax, most prominent at the  right lung apex.  Loculated components noted along the right lung. 4.  Previously noted dense consolidation within the inferior right middle lung lobe has improved. 5.  Scattered underlying emphysema noted, with blebs seen at the left lung apex. 6.  Chronic  right-sided rib deformity again noted.   Original Report Authenticated By: Tonia Ghent, M.D.      Recent Results (from the past 720 hour(s))  SURGICAL PCR SCREEN     Status: Normal   Collection Time   11/02/12 11:07 AM      Component Value Range Status Comment   MRSA, PCR NEGATIVE  NEGATIVE Final    Staphylococcus aureus NEGATIVE  NEGATIVE Final   AFB CULTURE WITH SMEAR     Status: Normal (Preliminary result)   Collection Time   11/05/12 12:12 PM      Component Value Range Status Comment   Specimen Description TISSUE LUNG RIGHT   Final    Special Requests RIGHT UPPER LUNG LOBE PT ON ZINACEF   Final    ACID FAST SMEAR NO ACID FAST BACILLI SEEN   Final    Culture     Final    Value: ACID FAST BACILLI ISOLATED     Note: IDENTIFICATION TO FOLLOW CRITICAL RESULT CALLED TO, READ BACK BY AND VERIFIED WITH: Corrie Dandy CARTER 12/17 AT 1638 BY DUNNJ   Report Status PENDING   Incomplete   FUNGUS CULTURE W SMEAR     Status: Normal (Preliminary result)   Collection Time   11/05/12 12:12 PM      Component Value Range Status Comment   Specimen Description TISSUE LUNG RIGHT   Final    Special Requests RIGHT UPPER LUNG LOBE PT ON ZINACEF   Final    Fungal Smear NO YEAST OR FUNGAL ELEMENTS SEEN   Final    Culture CULTURE IN PROGRESS FOR FOUR WEEKS   Final    Report Status PENDING   Incomplete   TISSUE CULTURE     Status: Normal   Collection Time   11/05/12 12:12 PM      Component Value Range Status Comment   Specimen Description TISSUE LUNG RIGHT   Final    Special Requests RIGHT UPPER LUNG LOBE PT ON ZINACEF   Final    Gram Stain     Final    Value: RARE WBC PRESENT,BOTH PMN AND MONONUCLEAR     NO ORGANISMS SEEN   Culture NO GROWTH 3 DAYS   Final    Report Status 11/08/2012 FINAL   Final   ANAEROBIC CULTURE     Status: Normal   Collection Time   11/05/12 12:17 PM      Component Value Range Status Comment   Specimen Description TISSUE LUNG RIGHT   Final    Special Requests RIGHT UPPER LUNG  LOBE PT ON ZINACEF   Final    Gram Stain     Final    Value: RARE WBC PRESENT,BOTH PMN AND MONONUCLEAR     NO ORGANISMS SEEN   Culture NO ANAEROBES ISOLATED   Final    Report Status 11/10/2012 FINAL   Final   CULTURE, BLOOD (ROUTINE X 2)     Status: Normal   Collection Time   11/11/12  6:15 PM      Component Value Range Status Comment   Specimen Description BLOOD RIGHT ARM   Final    Special Requests BOTTLES DRAWN AEROBIC AND ANAEROBIC 10CC EA   Final    Culture  Setup Time 11/12/2012  09:15   Final    Culture NO GROWTH 5 DAYS   Final    Report Status 11/18/2012 FINAL   Final   CULTURE, BLOOD (ROUTINE X 2)     Status: Normal   Collection Time   11/11/12  6:21 PM      Component Value Range Status Comment   Specimen Description BLOOD LEFT ARM   Final    Special Requests BOTTLES DRAWN AEROBIC AND ANAEROBIC 10CC EA   Final    Culture  Setup Time 11/12/2012 09:14   Final    Culture NO GROWTH 5 DAYS   Final    Report Status 11/18/2012 FINAL   Final   CULTURE, EXPECTORATED SPUTUM-ASSESSMENT     Status: Normal   Collection Time   11/12/12  9:59 PM      Component Value Range Status Comment   Specimen Description SPUTUM   Final    Special Requests NONE   Final    Sputum evaluation     Final    Value: THIS SPECIMEN IS ACCEPTABLE. RESPIRATORY CULTURE REPORT TO FOLLOW.   Report Status 11/13/2012 FINAL   Final   CULTURE, RESPIRATORY     Status: Normal   Collection Time   11/12/12  9:59 PM      Component Value Range Status Comment   Specimen Description SPUTUM   Final    Special Requests ADDED 11/13/12   Final    Gram Stain     Final    Value: RARE WBC PRESENT,BOTH PMN AND MONONUCLEAR     FEW SQUAMOUS EPITHELIAL CELLS PRESENT     MODERATE YEAST   Culture ABUNDANT CANDIDA ALBICANS   Final    Report Status 11/15/2012 FINAL   Final   CLOSTRIDIUM DIFFICILE BY PCR     Status: Normal   Collection Time   11/26/12 10:32 PM      Component Value Range Status Comment   C difficile by pcr NEGATIVE   NEGATIVE Final      Impression/Recommendation   56 year old sp RUL lobectomy with path showing granulomas and culture growing a Mycobacterium on therapy for M avium (presumptively) now admitted with pleuritc chest pain and worsening of postoperative area in mediastinum  #1 Fevers: Differential includes postoperative infection with worsening of site near the mediastinum. She could also have candidal esophagitis Resistant to azoles--though that is NOT likely. She could have M Tuberculosis and not be on effective therapy, or she could have a different Non TB mycobacterium  --I will switch for consult to micafungin in case she did have candidal esophagitis with a resistant species --if she has infection in the mediastinum with pyogenic bacteria then we will need help from CVTS surgery.  In the interval therapy with Vancomycin for MRSA is reasonable and I will switch from cefepime to imipenem to cover for pseudomonas, ESBL and anerobes --with re to the Mycobacterium I will exchange azithro for claritho but otherwise continue these drugs (see below)  #2 NTuberculous vs TB in lungs: --will continue ethambutol rifampin and exchange clarithromycin for azithromycin. --I. Will place the patient in airborne precautions for now --I. Protocol and the microbiology lab see if he can identify what this organism is and if we can run and PCR for Mycobacterium tuberculosis on this organism.   #3 Esophagitis? Changing to mycafungin  I spent greater than 45 minutes with the patient including greater than 50% of time in face to face counsel of the patient and in coordination of their  care.   Dr. Ninetta Lights will be covering the next 4 days of the Holidays.   Thank you so much for this interesting consult  [facility] [phone] (pager) [phone] (office) 11/27/2012, 12:52 PM  Paulette Blanch Dam 11/27/2012, 12:52 PM    ADDENDUM: I SPOKE WITH GLORIA  FUENTES FROM [employer] [facility] AND THIS PTS MYCOBACTERIA WAS   DNA PROBE NEGATIVE FOR MTB, M AVIUM  IT IS SHOWING A YELLOW CULTURE ON MEDIA AND MAY BE CONSISTENT WITH A M [place] OR GORDON[place] FORMAL ID HAS BEEN SENT OUT  THEREFORE I AM DC'ING AIRBORNE PRECUATIONS AND CHANGING PT TO  [facility], [facility] AND [facility] FOR POSSIBLE [place] INFECTION

## 2012-11-28 DIAGNOSIS — R1310 Dysphagia, unspecified: Secondary | ICD-10-CM

## 2012-11-28 LAB — CBC
HCT: 29.3 % — ABNORMAL LOW (ref 36.0–46.0)
Hemoglobin: 9.7 g/dL — ABNORMAL LOW (ref 12.0–15.0)
WBC: 16.7 10*3/uL — ABNORMAL HIGH (ref 4.0–10.5)

## 2012-11-28 LAB — COMPREHENSIVE METABOLIC PANEL
Alkaline Phosphatase: 163 U/L — ABNORMAL HIGH (ref 39–117)
BUN: 4 mg/dL — ABNORMAL LOW (ref 6–23)
CO2: 25 mEq/L (ref 19–32)
Chloride: 97 mEq/L (ref 96–112)
GFR calc Af Amer: >90 mL/min (ref >90)
GFR calc non Af Amer: >90 mL/min (ref >90)
Glucose, Bld: 118 mg/dL — ABNORMAL HIGH (ref 70–99)
Potassium: 3 mEq/L — ABNORMAL LOW (ref 3.5–5.1)
Total Bilirubin: 0.4 mg/dL (ref 0.3–1.2)

## 2012-11-28 LAB — EXPECTORATED SPUTUM ASSESSMENT W GRAM STAIN, RFLX TO RESP C

## 2012-11-28 MED ORDER — BOOST / RESOURCE BREEZE PO LIQD
1.0000 | Freq: Three times a day (TID) | ORAL | Status: DC
  Administered 2012-11-28 – 2012-12-04 (×10): 1 via ORAL

## 2012-11-28 MED ORDER — GABAPENTIN 100 MG PO CAPS
100.0000 mg | ORAL_CAPSULE | Freq: Two times a day (BID) | ORAL | Status: DC
  Administered 2012-11-28 – 2012-11-29 (×3): 100 mg via ORAL
  Filled 2012-11-28 (×4): qty 1

## 2012-11-28 MED ORDER — POTASSIUM CHLORIDE 20 MEQ/15ML (10%) PO LIQD
40.0000 meq | Freq: Once | ORAL | Status: AC
  Administered 2012-11-28: 40 meq via ORAL
  Filled 2012-11-28: qty 30

## 2012-11-28 MED ORDER — MORPHINE SULFATE 2 MG/ML IJ SOLN
2.0000 mg | INTRAMUSCULAR | Status: DC | PRN
  Administered 2012-11-28 – 2012-12-03 (×9): 2 mg via INTRAVENOUS
  Filled 2012-11-28 (×9): qty 1

## 2012-11-28 NOTE — Progress Notes (Signed)
TCTS  Patient examined and chest CT scan reviewed Status post right thoracotomy right upper lobectomy for lung abscess early  this month.  Readmitted with chest pain right anterior to posterior radiation in nature. Some associated fever. White count now elevated to 16,000.  Thoracotomy incision well-healed. CT scan and chest shows pleural thickening postoperative changes no evidence of significant pneumothorax, empyema, or effusion  Her chest pain is probable postthoracotomy pain which can be controlled with a combination of narcotic Ultram and if needed Neurontin. Will follow

## 2012-11-28 NOTE — Plan of Care (Signed)
Problem: Phase I Progression Outcomes Goal: Initial discharge plan identified Outcome: Completed/Met Date Met:  11/28/12 To return home     

## 2012-11-28 NOTE — Consult Note (Signed)
[facility] Consultation  Referring Provider:   [facility] Primary Care Physician:  Willow Ora, MD Primary Gastroenterologist:   None locally    Reason for Consultation:    Odynophagia      HPI: [name] [name] is a 56 y.o. 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 Over the last few days she has been complaining of worsening odynophagia. She denies dysphagia, per se. CT scan from December 23, which I reviewed, demonstrated diffuse thickening of the esophagus.  In the last 24 hours patient reports improvement in her odontophagia. She is taking Protonix,micafungin for possible candidal esophagitis.   Past Medical History  Diagnosis Date  . COPD (chronic obstructive pulmonary disease)     per CT 11/10/05, will minimal bronchiectasis rll  . GERD (gastroesophageal reflux disease)   . Osteoporosis     dexa per gyn  . 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 Headache   . Lichen planus   . B12 deficiency   . Endometriosis   . Cataracts, bilateral   . Arthritis   . Lung mass   . Asthma     .  years ago- has not had problem for years.  . Pneumonia     last ime 2008  . Hemorrhoid   . Pneumothorax, spontaneous, tension     x 2 in her 20's.  has a chest tube for one    Past Surgical History  Procedure Date  . Knee surgery     .not replacement .  Marland Kitchen Ovarian cyst removed   . Video bronchoscopy 02/15/2012    Procedure: VIDEO BRONCHOSCOPY WITH FLUORO;  Surgeon: Nyoka Cowden, MD;  Location: [facility];  Service: Endoscopy;  Laterality: Bilateral;  WASHING- INTERVENTION BIOPSIES INTERVENTION BRONCHOSCOPY WITH VIDEO  . Mediastinoscopy 05/25/2012    Procedure: MEDIASTINOSCOPY;  Surgeon: Alleen Borne, MD;  Location: [facility] OR;  Service: Thoracic;  Laterality: N/A;  . Tonsillectomy   . Flexible bronchoscopy 11/05/2012    Procedure: FLEXIBLE BRONCHOSCOPY;   Surgeon: Alleen Borne, MD;  Location: [facility] OR;  Service: Thoracic;  Laterality: N/A;  . Thoracotomy 11/05/2012    Procedure: THORACOTOMY MAJOR;  Surgeon: Alleen Borne, MD;  Location: [facility] OR;  Service: Thoracic;  Laterality: Right;  . Lobectomy 11/05/2012    Procedure: LOBECTOMY;  Surgeon: Alleen Borne, MD;  Location: [facility] OR;  Service: Thoracic;  Laterality: Right;  right upper lobectomy    Family History  Problem Relation Age of Onset  . Colon cancer Neg Hx   . Breast cancer Neg Hx   . Diabetes Mother     GM  . Heart attack      ?  Marland Kitchen Heart disease Mother   . Heart disease Maternal Grandfather   . Cancer Father     sarcoma    History  Substance Use Topics  . Smoking status: Former Smoker -- 1.0 packs/day for 38 years    Types: Cigarettes  . Smokeless tobacco: Never Used  . Alcohol Use: No     Comment: wine occ    Prior to Admission medications   Medication Sig Start Date End Date Taking? Authorizing Provider  acetaminophen (TYLENOL) 500 MG tablet Take 500 mg by mouth once as needed. For fever   Yes Historical Provider, MD  ALPRAZolam (XANAX) 0.5 MG tablet Take 0.25-0.5 mg by mouth at bedtime as needed.  For insomnia.   Yes Historical Provider, MD  clarithromycin (BIAXIN) 500 MG tablet Take 1,000 mg by mouth daily with supper.    Yes Gardiner Barefoot, MD  ethambutol (MYAMBUTOL) 400 MG tablet Take 800 mg by mouth daily.   Yes Historical Provider, MD  ferrous gluconate (FERGON) 324 MG tablet Take 1 tablet (324 mg total) by mouth daily with breakfast. 11/18/12  Yes Erin Barrett, PA  fluconazole (DIFLUCAN) 100 MG tablet Take 100 mg by mouth daily.   Yes Historical Provider, MD  nicotine (NICOTROL) 10 MG inhaler Inhale 1 puff into the lungs every hour as needed. For nicotine cravings.   Yes Historical Provider, MD  nicotine polacrilex (NICORETTE) 4 MG lozenge Place 4 mg inside cheek every 3 (three) hours as needed. For nicotine cravings.   Yes Historical Provider, MD  omeprazole  (PRILOSEC) 20 MG capsule Take 20 mg by mouth daily.    Yes Historical Provider, MD  Oxycodone HCl 10 MG TABS Take 10 mg by mouth every 3 (three) hours as needed. For pain 11/18/12  Yes Erin Barrett, PA  rifampin (RIFADIN) 300 MG capsule Take 600 mg by mouth daily.   Yes Gardiner Barefoot, MD  traMADol (ULTRAM) 50 MG tablet Take 50-100 mg by mouth every 6 (six) hours as needed.   Yes Erin Barrett, PA  HYDROcodone-acetaminophen (NORCO) 7.5-325 MG per tablet Take 1 tablet by mouth every 6 (six) hours as needed for pain (1 tablet every 4-6 hrs as needed for pain). 11/26/12   Alleen Borne, MD  NONFORMULARY OR COMPOUNDED ITEM Take 1 tablet by mouth daily. Phytomega (cholesterol supplement)    Historical Provider, MD    Current Facility-Administered Medications  Medication Dose Route Frequency Provider Last Rate Last Dose  . 0.9 %  sodium chloride infusion   Intravenous Continuous Maretta Bees, MD 50 mL/hr at 11/27/12 1957    . acetaminophen (TYLENOL) tablet 650 mg  650 mg Oral Q6H PRN Standley Brooking, MD       Or  . acetaminophen (TYLENOL) suppository 650 mg  650 mg Rectal Q6H PRN Standley Brooking, MD      . alum & mag hydroxide-simeth (MAALOX/MYLANTA) 200-200-20 MG/5ML suspension 30 mL  30 mL Oral Q6H PRN Standley Brooking, MD      . antiseptic oral rinse (BIOTENE) solution 15 mL  15 mL Mouth Rinse BID Standley Brooking, MD   15 mL at 11/28/12 0800  . enoxaparin (LOVENOX) injection 40 mg  40 mg Subcutaneous Q24H Standley Brooking, MD   40 mg at 11/27/12 2222  . ethambutol (MYAMBUTOL) tablet 800 mg  800 mg Oral Daily Standley Brooking, MD   800 mg at 11/28/12 2130  . imipenem-cilastatin (PRIMAXIN) 250 mg in sodium chloride 0.9 % 100 mL IVPB  250 mg Intravenous Q6H Fredrik Rigger, PHARMD   250 mg at 11/28/12 0809  . isoniazid (NYDRAZID) tablet 300 mg  300 mg Oral Daily Randall Hiss, MD   300 mg at 11/28/12 [phone]  . micafungin (MYCAMINE) 150 mg in sodium chloride 0.9 % 100 mL IVPB  150  mg Intravenous Daily Randall Hiss, MD   150 mg at 11/27/12 1350  . morphine 2 MG/ML injection 2 mg  2 mg Intravenous Q3H PRN Leanne Chang, NP   2 mg at 11/28/12 0802  . nicotine polacrilex (COMMIT) lozenge 4 mg  4 mg Oral Q3H PRN Standley Brooking, MD      .  ondansetron (ZOFRAN) tablet 4 mg  4 mg Oral Q6H PRN Standley Brooking, MD       Or  . ondansetron [facility]) injection 4 mg  4 mg Intravenous Q6H PRN Standley Brooking, MD      . oxyCODONE (Oxy IR/ROXICODONE) immediate release tablet 10 mg  10 mg Oral Q4H PRN Standley Brooking, MD   10 mg at 11/27/12 2001  . pantoprazole (PROTONIX) injection 40 mg  40 mg Intravenous Q12H Maretta Bees, MD   40 mg at 11/28/12 0931  . potassium chloride 20 MEQ/15ML (10%) liquid 40 mEq  40 mEq Oral Once Maretta Bees, MD      . pyridOXINE (VITAMIN B-6) tablet 50 mg  50 mg Oral Daily Randall Hiss, MD   50 mg at 11/28/12 [phone]  . rifampin (RIFADIN) capsule 600 mg  600 mg Oral Daily Standley Brooking, MD   600 mg at 11/28/12 9604  . saccharomyces boulardii (FLORASTOR) capsule 250 mg  250 mg Oral BID Maretta Bees, MD   250 mg at 11/28/12 5409  . sodium chloride 0.9 % injection 3 mL  3 mL Intravenous Q12H Standley Brooking, MD   3 mL at 11/27/12 2223  . sucralfate (CARAFATE) 1 GM/10ML suspension 1 g  1 g Oral TID WC & HS Maretta Bees, MD   1 g at 11/28/12 0811  . traMADol (ULTRAM) tablet 50-100 mg  50-100 mg Oral Q6H PRN Standley Brooking, MD   50 mg at 11/27/12 2234  . vancomycin (VANCOCIN) IVPB 1000 mg/200 mL premix  1,000 mg Intravenous Q8H Hessie Diener [place], PHARMD   1,000 mg at 11/28/12 8119    Allergies as of 11/26/2012 - Review Complete 11/26/2012  Allergen Reaction Noted  . Alendronate sodium Other (See Comments) 07/19/2007  . Antihistamines, chlorpheniramine-type Other (See Comments) 01/20/2012  . Benadryl (diphenhydramine) Other (See Comments) 10/23/2012  . Nsaids Other (See Comments) 07/19/2007  . Other Other (See  Comments) 10/23/2012  . Pseudoephedrine Other (See Comments) 10/16/2012  . Robitussin a-c (guaifenesin-codeine) Itching and Other (See Comments) 03/25/2012  . Levofloxacin Anxiety 07/19/2007     Review of Systems:    Gen: Denies any fever, chills, sweats, anorexia, fatigue, weakness, malaise, weight loss, and sleep disorder CV: Denies chest pain, angina, palpitations, syncope, orthopnea, PND, peripheral edema, and claudication. Resp: Denies dyspnea at rest, dyspnea with exercise, cough, sputum, wheezing, coughing up blood, and pleurisy. GI: Denies vomiting blood, jaundice, and fecal incontinence.   Denies dysphagia or odynophagia. GU : Denies urinary burning, blood in urine, urinary frequency, urinary hesitancy, nocturnal urination, and urinary incontinence. MS: Denies joint pain, limitation of movement, and swelling, stiffness, low back pain, extremity pain. Denies muscle weakness, cramps, atrophy.  Derm: Denies rash, itching, dry skin, hives, moles, warts, or unhealing ulcers.  Psych: Denies depression, anxiety, memory loss, suicidal ideation, hallucinations, paranoia, and confusion. Heme: Denies bruising, bleeding, and enlarged lymph nodes. Neuro:  Denies any headaches, dizziness, paresthesias. Endo:  Denies any problems with DM, thyroid, adrenal function.  PHYSICAL EXAM: Vital signs in last 24 hours: Temp:  [98.7 F (37.1 C)] 98.7 F (37.1 C) (12/25 0502) Pulse Rate:  [88] 88  (12/25 0502) Resp:  [20] 20  (12/25 0502) BP: (106)/(66) 106/66 mmHg (12/25 0502) SpO2:  [99 %] 99 % (12/25 0502) Last BM Date: 11/27/12 General:   Pleasant *well developed** female in NAD Head:  Normocephalic and atraumatic. Eyes:   No icterus.   Conjunctiva pink.  Ears:  Normal auditory acuity. Neck:  Supple; no masses felt Lungs:  Respirations even and unlabored. Lungs clear to auscultation bilaterally.   No wheezes, crackles, or rhonchi.  Heart:  Regular rate and rhythm;  murmur heard. Abdomen:   Soft, nondistended, nontender. Normal bowel sounds. No appreciable masses or hepatomegaly.  Rectal:  Not performed.  Msk:  Symmetrical without gross deformities.  Extremities:  Without edema. Neurologic:  Alert and  oriented x4;  grossly normal neurologically. Skin:  Intact without significant lesions or rashes. Cervical Nodes:  No significant cervical adenopathy. Psych:  Alert and cooperative. Normal affect.  LAB RESULTS:  Basename 11/28/12 0644 11/27/12 0556 11/26/12 1631  WBC 16.7* 9.6 14.6*  HGB 9.7* 9.5* 9.7*  HCT 29.3* 29.6* 29.3*  PLT 423* 473* 505*   BMET  Basename 11/28/12 0644 11/27/12 0556 11/26/12 1631  NA 135 135 132*  K 3.0* 3.4* 3.8  CL 97 99 97  CO2 25 24 20   GLUCOSE 118* 114* 117*  BUN 4* 4* 4*  CREATININE 0.45* 0.45* 0.41*  CALCIUM 8.8 8.8 8.7   LFT  Basename 11/28/12 0644  PROT 6.4  ALBUMIN 2.0*  AST 10  ALT 8  ALKPHOS 163*  BILITOT 0.4  BILIDIR --  IBILI --   PT/INR No results found for this basename: LABPROT:2,INR:2 in the last 72 hours  STUDIES: Dg Chest 2 View  11/26/2012  *RADIOLOGY REPORT*  Clinical Data: Fever  CHEST - 2 VIEW  Comparison: 09/2012  Findings: Interstitial prominence.  Bronchitic changes.  Increased AP diameter of the chest.  Mild cardiomegaly.  Normal pulmonary vascularity.  Loculated right apical hydropneumothorax has improved.  Heterogeneous opacities throughout the right mid and lower lung zones have improved.  Minimal residual subsegmental atelectasis at the left base.  IMPRESSION: Improved loculated right hydropneumothorax.  Improved heterogeneous pulmonary opacities in the right mid and lower lung zones.   Original Report Authenticated By: Jolaine Click, M.D.    Ct Angio Chest Pe W/cm &/or Wo Cm  11/27/2012  *RADIOLOGY REPORT*  Clinical Data: Pleuritic chest pain, worsening shortness of breath and fever.  History of right upper lobectomy and MAC.  CT ANGIOGRAPHY CHEST  Technique:  Multidetector CT imaging of the chest  using the standard protocol during bolus administration of intravenous contrast. Multiplanar reconstructed images including MIPs were obtained and reviewed to evaluate the vascular anatomy.  Contrast: 80mL OMNIPAQUE IOHEXOL 350 MG/ML SOLN  Comparison: CT of the chest performed 11/15/2012  Findings: There is no evidence of significant pulmonary embolus. Evaluation for pulmonary embolus is mildly suboptimal due to motion artifact.  There is a persistent small right-sided hydropneumothorax, most prominent at the right lung apex; this is grossly unchanged in appearance from the prior study.  Extensive postoperative change is noted along the right side of the mediastinum, with associated apparent dense sutures and underlying atelectasis.  Significant associated soft tissue density is of uncertain significance, tracking inferiorly along the posterior mediastinum.  The esophagus is obscured due to surrounding soft tissue attenuation.  The degree of soft tissue inflammation about the esophagus appears significantly worsened from the prior study; this could conceivably reflect diffuse esophagitis.  Previously noted dense airspace opacification within the inferior right middle lobe has improved.  There is a persistent somewhat loculated small right-sided pleural effusion; previously noted air components within the effusion are less prominent.  Scattered blebs are seen at the left lung apex, and underlying emphysema is noted throughout both lungs, more prominent at the lung apices.  The  mediastinum is otherwise grossly unremarkable, though the significance of diffuse soft tissue attenuation along the right side of the mediastinum and posterior mediastinum is uncertain.  No pericardial effusion is identified.  The great vessels are grossly unremarkable in appearance.  No definite mediastinal lymphadenopathy is seen.  No axillary lymphadenopathy is seen.  The thyroid gland is unremarkable in appearance.  The visualized portions  of the liver and spleen are unremarkable.  No acute osseous abnormalities are seen.  Chronic right-sided rib deformity is noted.  IMPRESSION:  1.  No evidence of significant pulmonary embolus. 2.  Extensive postoperative change again noted along the right side of the mediastinum, with associated soft tissue density tracking inferiorly along the posterior mediastinum, of uncertain significance.  The esophagus is obscured due to surrounding soft tissue attenuation.  Would correlate for history of malignancy; this could simply reflect postoperative change.  The degree of soft tissue inflammation about the esophagus appears significantly worsened from the recent prior study.  This could conceivably reflect diffuse esophagitis.  3.  Persistent small right-sided hydropneumothorax, most prominent at the right lung apex.  Loculated components noted along the right lung. 4.  Previously noted dense consolidation within the inferior right middle lung lobe has improved. 5.  Scattered underlying emphysema noted, with blebs seen at the left lung apex. 6.  Chronic right-sided rib deformity again noted.   Original Report Authenticated By: Tonia Ghent, M.D.      PREVIOUS ENDOSCOPIES: colonoscopy 2009 at [facility] diverticulosis, internal hemorrhoids,polyp  Colonoscopy 2000 at [facility] - findings included ischemic colitis  IMPRESSION / PLAN: 1. Odynophagia - patient certainly is at risk for Candida esophagitis or other opportunistic infection of the esophagus.  2. Diarrhea - C. difficile negative  3. RUL cavitary mass, s/p thoracotomy earlier this month  Inasmuch as the patient's odontophagia is improving will hold endoscopy. Should symptoms worsen then we can reconsider.        Thanks   LOS: 2 days   Willette Cluster  11/28/2012, 9:59 AM

## 2012-11-28 NOTE — Progress Notes (Signed)
INFECTIOUS DISEASE PROGRESS NOTE  ID: [name] [name] is a 56 y.o. female with  Principal Problem:  *Pleuritic chest pain Active Problems:  Fever  MAC (mycobacterium avium-intracellulare complex)  Diarrhea  Anemia  Odynophagia  Subjective: Would like to go off her anbx til her "surgery has healed itself". C/o hand swelling, inablity to move her hands.   Abtx:  Anti-infectives     Start     Dose/Rate Route Frequency Ordered Stop   11/27/12 1730   isoniazid (NYDRAZID) tablet 300 mg        300 mg Oral Daily 11/27/12 1723     11/27/12 1600   vancomycin (VANCOCIN) IVPB 1000 mg/200 mL premix        1,000 mg 200 mL/hr over 60 Minutes Intravenous Every 8 hours 11/27/12 1333     11/27/12 1400   micafungin (MYCAMINE) 150 mg in sodium chloride 0.9 % 100 mL IVPB        150 mg 100 mL/hr over 1 Hours Intravenous Daily 11/27/12 1251     11/27/12 1400   azithromycin (ZITHROMAX) tablet 500 mg  Status:  Discontinued        500 mg Oral Daily 11/27/12 1252 11/27/12 1724   11/27/12 1400   imipenem-cilastatin (PRIMAXIN) 250 mg in sodium chloride 0.9 % 100 mL IVPB        250 mg 200 mL/hr over 30 Minutes Intravenous 4 times per day 11/27/12 1333     11/27/12 1000   fluconazole (DIFLUCAN) tablet 100 mg  Status:  Discontinued        100 mg Oral Daily 11/26/12 2048 11/27/12 1248   11/27/12 1000   rifampin (RIFADIN) capsule 600 mg        600 mg Oral Daily 11/26/12 2048     11/27/12 0700   vancomycin (VANCOCIN) IVPB 1000 mg/200 mL premix  Status:  Discontinued        1,000 mg 200 mL/hr over 60 Minutes Intravenous Every 8 hours 11/26/12 2042 11/27/12 1248   11/26/12 2200   clarithromycin (BIAXIN) tablet 1,000 mg  Status:  Discontinued        1,000 mg Oral Daily with supper 11/26/12 2048 11/27/12 1248   11/26/12 2200   ethambutol (MYAMBUTOL) tablet 800 mg        800 mg Oral Daily 11/26/12 2048     11/26/12 2200   ceFEPIme (MAXIPIME) 1 g in dextrose 5 % 50 mL IVPB  Status:  Discontinued        1 g 100 mL/hr over 30 Minutes Intravenous 3 times per day 11/26/12 2042 11/27/12 1248   11/26/12 2030   vancomycin (VANCOCIN) IVPB 1000 mg/200 mL premix        1,000 mg 200 mL/hr over 60 Minutes Intravenous  Once 11/26/12 2028 11/27/12 0029   11/26/12 1830   cefTRIAXone (ROCEPHIN) 1 g in dextrose 5 % 50 mL IVPB  Status:  Discontinued        1 g 100 mL/hr over 30 Minutes Intravenous Every 24 hours 11/26/12 1816 11/26/12 2048          Medications:  Scheduled:   . antiseptic oral rinse  15 mL Mouth Rinse BID  . enoxaparin (LOVENOX) injection  40 mg Subcutaneous Q24H  . ethambutol  800 mg Oral Daily  . gabapentin  100 mg Oral BID  . imipenem-cilastatin  250 mg Intravenous Q6H  . isoniazid  300 mg Oral Daily  . micafungin [facility]) IV  150 mg Intravenous Daily  .  pantoprazole (PROTONIX) IV  40 mg Intravenous Q12H  . vitamin B-6  50 mg Oral Daily  . rifampin  600 mg Oral Daily  . saccharomyces boulardii  250 mg Oral BID  . sodium chloride  3 mL Intravenous Q12H  . sucralfate  1 g Oral TID WC & HS  . vancomycin  1,000 mg Intravenous Q8H    Objective: Vital signs in last 24 hours: Temp:  [98.7 F (37.1 C)] 98.7 F (37.1 C) (12/25 0502) Pulse Rate:  [88] 88  (12/25 0502) Resp:  [20] 20  (12/25 0502) BP: (106)/(66) 106/66 mmHg (12/25 0502) SpO2:  [99 %] 99 % (12/25 0502)   General appearance: alert, cooperative, fatigued and no distress Throat: normal findings: oropharynx pink & moist without lesions or evidence of thrush Resp: rhonchi bilaterally Chest wall: no tenderness, wound on R lower chest is well healed. no fluctuance. non-tender.  Cardio: regular rate and rhythm GI: normal findings: bowel sounds normal and soft, non-tender Swelling of fingers, no cordis in her biceps.   Lab Results  [facility] 11/28/12 0644 11/27/12 0556  WBC 16.7* 9.6  HGB 9.7* 9.5*  HCT 29.3* 29.6*  NA 135 135  K 3.0* 3.4*  CL 97 99  CO2 25 24  BUN 4* 4*  CREATININE 0.45* 0.45*    GLU -- --   Liver Panel  Basename 11/28/12 0644 11/26/12 1631  PROT 6.4 7.1  ALBUMIN 2.0* 2.5*  AST 10 18  ALT 8 12  ALKPHOS 163* 167*  BILITOT 0.4 0.5  BILIDIR -- --  IBILI -- --   Sedimentation Rate No results found for this basename: ESRSEDRATE in the last 72 hours C-Reactive Protein No results found for this basename: CRP:2 in the last 72 hours  Microbiology: Recent Results (from the past 240 hour(s))  URINE CULTURE     Status: Normal   Collection Time   11/26/12  6:13 PM      Component Value Range Status Comment   Specimen Description URINE, CLEAN CATCH   Final    Special Requests NONE   Final    Culture  Setup Time 11/26/2012 20:02   Final    Colony Count NO GROWTH   Final    Culture NO GROWTH   Final    Report Status 11/27/2012 FINAL   Final   CLOSTRIDIUM DIFFICILE BY PCR     Status: Normal   Collection Time   11/26/12 10:32 PM      Component Value Range Status Comment   C difficile by pcr NEGATIVE  NEGATIVE Final   CULTURE, EXPECTORATED SPUTUM-ASSESSMENT     Status: Normal   Collection Time   11/27/12 11:47 PM      Component Value Range Status Comment   Specimen Description SPUTUM   Final    Special Requests NONE   Final    Sputum evaluation     Final    Value: MICROSCOPIC FINDINGS SUGGEST THAT THIS SPECIMEN IS NOT REPRESENTATIVE OF LOWER RESPIRATORY SECRETIONS. PLEASE RECOLLECT.     CALLED TO RN [facility] AT 0148 11/28/12 LGP   Report Status 11/28/2012 FINAL   Final     Studies/Results: Dg Chest 2 View  11/26/2012  *RADIOLOGY REPORT*  Clinical Data: Fever  CHEST - 2 VIEW  Comparison: 09/2012  Findings: Interstitial prominence.  Bronchitic changes.  Increased AP diameter of the chest.  Mild cardiomegaly.  Normal pulmonary vascularity.  Loculated right apical hydropneumothorax has improved.  Heterogeneous opacities throughout the right mid and lower  lung zones have improved.  Minimal residual subsegmental atelectasis at the left base.  IMPRESSION:  Improved loculated right hydropneumothorax.  Improved heterogeneous pulmonary opacities in the right mid and lower lung zones.   Original Report Authenticated By: Jolaine Click, M.D.    Ct Angio Chest Pe W/cm &/or Wo Cm  11/27/2012  *RADIOLOGY REPORT*  Clinical Data: Pleuritic chest pain, worsening shortness of breath and fever.  History of right upper lobectomy and MAC.  CT ANGIOGRAPHY CHEST  Technique:  Multidetector CT imaging of the chest using the standard protocol during bolus administration of intravenous contrast. Multiplanar reconstructed images including MIPs were obtained and reviewed to evaluate the vascular anatomy.  Contrast: 80mL OMNIPAQUE IOHEXOL 350 MG/ML SOLN  Comparison: CT of the chest performed 11/15/2012  Findings: There is no evidence of significant pulmonary embolus. Evaluation for pulmonary embolus is mildly suboptimal due to motion artifact.  There is a persistent small right-sided hydropneumothorax, most prominent at the right lung apex; this is grossly unchanged in appearance from the prior study.  Extensive postoperative change is noted along the right side of the mediastinum, with associated apparent dense sutures and underlying atelectasis.  Significant associated soft tissue density is of uncertain significance, tracking inferiorly along the posterior mediastinum.  The esophagus is obscured due to surrounding soft tissue attenuation.  The degree of soft tissue inflammation about the esophagus appears significantly worsened from the prior study; this could conceivably reflect diffuse esophagitis.  Previously noted dense airspace opacification within the inferior right middle lobe has improved.  There is a persistent somewhat loculated small right-sided pleural effusion; previously noted air components within the effusion are less prominent.  Scattered blebs are seen at the left lung apex, and underlying emphysema is noted throughout both lungs, more prominent at the lung apices.  The  mediastinum is otherwise grossly unremarkable, though the significance of diffuse soft tissue attenuation along the right side of the mediastinum and posterior mediastinum is uncertain.  No pericardial effusion is identified.  The great vessels are grossly unremarkable in appearance.  No definite mediastinal lymphadenopathy is seen.  No axillary lymphadenopathy is seen.  The thyroid gland is unremarkable in appearance.  The visualized portions of the liver and spleen are unremarkable.  No acute osseous abnormalities are seen.  Chronic right-sided rib deformity is noted.  IMPRESSION:  1.  No evidence of significant pulmonary embolus. 2.  Extensive postoperative change again noted along the right side of the mediastinum, with associated soft tissue density tracking inferiorly along the posterior mediastinum, of uncertain significance.  The esophagus is obscured due to surrounding soft tissue attenuation.  Would correlate for history of malignancy; this could simply reflect postoperative change.  The degree of soft tissue inflammation about the esophagus appears significantly worsened from the recent prior study.  This could conceivably reflect diffuse esophagitis.  3.  Persistent small right-sided hydropneumothorax, most prominent at the right lung apex.  Loculated components noted along the right lung. 4.  Previously noted dense consolidation within the inferior right middle lung lobe has improved. 5.  Scattered underlying emphysema noted, with blebs seen at the left lung apex. 6.  Chronic right-sided rib deformity again noted.   Original Report Authenticated By: Tonia Ghent, M.D.      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  F/u CXR consdier  tapering her anbx as she improves.            Johny Sax Infectious Diseases [phone] 11/28/2012, 2:53 PM   LOS: 2 days

## 2012-11-28 NOTE — Progress Notes (Signed)
PATIENT DETAILS Name: [name] [name] Age: 56 y.o. Sex: female Date of Birth: [birth year] Admit Date: 11/26/2012 Admitting Physician Standley Brooking, MD ZOX:[id number] Drue Novel, MD  Subjective: Right sided Chest pain somewhat better. Odynophagia somewhat better  Assessment/Plan: Principal Problem:  *Pleuritic chest pain  - CTA of the chest negative for pulmonary embolism - CT of the chest showing a lot of postoperative changes, but also showing a soft tissue density in the posterior mediastinum. 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 Luciana Axe- who presumed that this is a MAC infection (rather than mycobacterium TB- as PPD and Quantiferon were negative)- patient as a result was started on rifampin, ethambutol and Biaxin.  Esophagitis - No oral thrush-but at risk for candidal esophagitis - On Empiric Micafungin - Change PPI to twice a day -appreciate GI input  ? UTI -.Doubt she has UTI - Urine cultures negative  Diarrhea - Stool C. 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 Algis Liming to evaluate   Anemia - Suspect this is chronic-from chronic disease - Monitor H&H  periodically - Anemia panel if hemoglobin drops further-but is stable  Hypokalemia -replete and recheck in am  GERD - Continue with PPI- but change to twice a day given esophageal thickening seen on the CT  Disposition: Remain inpatient  DVT Prophylaxis: Prophylactic Lovenox   Code Status: Full code  Procedures:  None  CONSULTS:  ID  PHYSICAL EXAM: Vital signs in last 24 hours: Filed Vitals:   11/26/12 2020 11/26/12 2121 11/27/12 0623 11/28/12 0502  BP:  112/72 101/65 106/66  Pulse:  88 89 88  Temp: 98.7 F (37.1 C) 99.5 F (37.5 C) 98.3 F (36.8 C) 98.7 F (37.1 C)  TempSrc: Oral Oral Oral Oral  Resp:  18 18 20   Height: 5\' 6"  (1.676 m) 5\' 6"  (1.676 m)    Weight: 49.896 kg (110 lb) 50.8 kg (111 lb 15.9 oz)    SpO2:  96% 99% 99%    Weight change:  Body mass index is 18.08 kg/(m^2).   Gen Exam: Awake and alert with clear speech.   Neck: Supple, No JVD.   Chest: B/L Clear.   CVS: S1 S2 Regular, no murmurs.  Abdomen: soft, BS +, non tender, non distended.  Extremities: no edema, lower extremities warm to touch. Neurologic: Non Focal.   Skin: No Rash.   Wounds: N/A.    Intake/Output from previous day:  Intake/Output Summary (Last 24 hours) at 11/28/12 1317 Last data filed at 11/28/12 0229  Gross per 24 hour  Intake  312.5 ml  Output      0 ml  Net  312.5 ml     LAB RESULTS: CBC  Lab 11/28/12 0644 11/27/12 0556 11/26/12 1631  WBC 16.7* 9.6 14.6*  HGB  9.7* 9.5* 9.7*  HCT 29.3* 29.6* 29.3*  PLT 423* 473* 505*  MCV 94.8 96.7 95.8  MCH 31.4 31.0 31.7  MCHC 33.1 32.1 33.1  RDW 13.5 13.6 13.5  LYMPHSABS -- -- 0.6*  MONOABS -- -- 1.0  EOSABS -- -- 0.0  BASOSABS -- -- 0.0  BANDABS -- -- --    Chemistries   Lab 11/28/12 0644 11/27/12 0556 11/26/12 1631  NA 135 135 132*  K 3.0* 3.4* 3.8  CL 97 99 97  CO2 25 24 20   GLUCOSE 118* 114* 117*  BUN 4* 4* 4*  CREATININE 0.45* 0.45* 0.41*  CALCIUM 8.8 8.8 8.7  MG -- -- --    CBG: No results  found for this basename: GLUCAP:5 in the last 168 hours  GFR Estimated Creatinine Clearance: 64.5 ml/min (by C-G formula based on Cr of 0.45).  Coagulation profile No results found for this basename: INR:5,PROTIME:5 in the last 168 hours  Cardiac Enzymes  Lab 11/27/12 1200  CKMB --  TROPONINI <0.30  MYOGLOBIN --    No components found with this basename: POCBNP:3 No results found for this basename: DDIMER:2 in the last 72 hours No results found for this basename: HGBA1C:2 in the last 72 hours No results found for this basename: CHOL:2,HDL:2,LDLCALC:2,TRIG:2,CHOLHDL:2,LDLDIRECT:2 in the last 72 hours No results found for this basename: TSH,T4TOTAL,FREET3,T3FREE,THYROIDAB in the last 72 hours No results found for this basename: VITAMINB12:2,FOLATE:2,FERRITIN:2,TIBC:2,IRON:2,RETICCTPCT:2 in the last 72 hours No results found for this basename: LIPASE:2,AMYLASE:2 in the last 72 hours  Urine Studies No results found for this basename: UACOL:2,UAPR:2,USPG:2,UPH:2,UTP:2,UGL:2,UKET:2,UBIL:2,UHGB:2,UNIT:2,UROB:2,ULEU:2,UEPI:2,UWBC:2,URBC:2,UBAC:2,CAST:2,CRYS:2,UCOM:2,BILUA:2 in the last 72 hours  MICROBIOLOGY: Recent Results (from the past 240 hour(s))  URINE CULTURE     Status: Normal   Collection Time   11/26/12  6:13 PM      Component Value Range Status Comment   Specimen Description URINE, CLEAN CATCH   Final    Special Requests NONE   Final    Culture  Setup Time 11/26/2012 20:02   Final    Colony Count NO GROWTH   Final    Culture NO GROWTH   Final    Report Status 11/27/2012 FINAL   Final   CLOSTRIDIUM DIFFICILE BY PCR     Status: Normal   Collection Time   11/26/12 10:32 PM      Component Value Range Status Comment   C difficile by pcr NEGATIVE  NEGATIVE Final   CULTURE, EXPECTORATED SPUTUM-ASSESSMENT     Status: Normal   Collection Time   11/27/12 11:47 PM      Component Value Range Status Comment   Specimen Description SPUTUM   Final    Special Requests NONE   Final     Sputum evaluation     Final    Value: MICROSCOPIC FINDINGS SUGGEST THAT THIS SPECIMEN IS NOT REPRESENTATIVE OF LOWER RESPIRATORY SECRETIONS. PLEASE RECOLLECT.     CALLED TO RN [facility] AT 0148 11/28/12 LGP   Report Status 11/28/2012 FINAL   Final     RADIOLOGY STUDIES/RESULTS: Dg Chest 2 View  11/26/2012  *RADIOLOGY REPORT*  Clinical Data: Fever  CHEST - 2 VIEW  Comparison: 09/2012  Findings: Interstitial prominence.  Bronchitic changes.  Increased AP diameter of the chest.  Mild cardiomegaly.  Normal pulmonary vascularity.  Loculated right apical hydropneumothorax has improved.  Heterogeneous opacities throughout the right mid and lower lung zones have improved.  Minimal residual subsegmental atelectasis at the left base.  IMPRESSION: Improved loculated right hydropneumothorax.  Improved heterogeneous pulmonary  opacities in the right mid and lower lung zones.   Original Report Authenticated By: Jolaine Click, M.D.    Dg Chest 2 View  11/16/2012  *RADIOLOGY REPORT*  Clinical Data: Pneumonia  CHEST - 2 VIEW  Comparison: Yesterday  Findings: Right chest tube has been removed.  Stable right hydropneumothorax.  Opacities within the right lower lung zone and right suprahilar density are stable.  Interstitial prominence throughout the left lung with hyperaeration is stable.  No left pneumothorax.  Normal heart size.  IMPRESSION: Stable right hydropneumothorax after chest tube removal.   Original Report Authenticated By: Jolaine Click, M.D.    Dg Chest 2 View  11/15/2012  *RADIOLOGY REPORT*  Clinical Data: Shortness of breath, follow up pneumonia  CHEST - 2 VIEW  Comparison: Portable chest x-ray of 11/14/2012 and 11/13/2012  Findings: Aeration has improved slightly.  There is still right apical pneumothorax present with right chest tube unchanged in position.  Right perihilar opacity remains although opacity noted previously at the right lung base has improved.  The heart is within normal limits in size.   IMPRESSION:  1.  Stable right apical pneumothorax with right chest tube remaining. 2.  Some improvement in opacity at the right lung base with haziness overlying the right hilum remaining.   Original Report Authenticated By: Dwyane Dee, M.D.    Dg Chest 2 View  11/02/2012  *RADIOLOGY REPORT*  Clinical Data: Preop.  CHEST - 2 VIEW  Comparison: 08/12/2012  Findings: Right apical pleural/parenchymal density and cavitation again noted, which may be slightly progressed since prior study. Left apical scarring noted.  Heart is normal size.  No effusions. No acute bony abnormality.  IMPRESSION: Right apical density may have progressed slightly since prior study.   Original Report Authenticated By: Charlett Nose, M.D.    Ct Chest W Contrast  11/15/2012  *RADIOLOGY REPORT*  Clinical Data: Fever and right chest pain.  Status post right lobectomy.  Smoker.  CT CHEST WITH CONTRAST  Technique:  Multidetector CT imaging of the chest was performed following the standard protocol during bolus administration of intravenous contrast.  Contrast: 80mL OMNIPAQUE IOHEXOL 300 MG/ML  SOLN  Comparison: Chestradiographs obtained earlier today and chest CT dated 08/15/2012.  Findings: Interval resection of the cavitary mass in the right upper lobe with multiple surgical staple lines and surgical clips. Approximately 20% right hydropneumothorax.  A portion of this is located at the apex and another portion is posteromedially located and appears partially loculated.  No fluid collections with peripheral rim enhancement are seen.  There is minimal atelectasis at both lung bases.  Bullous changes in the left lung are again demonstrated, remaining most pronounced at the medial aspect of the left upper lobe.  No lung masses or enlarged lymph nodes are seen.  A 1 cm cyst in the right lobe of the liver is unchanged.  Mild thoracic spine degenerative changes.  IMPRESSION:  1.  Right postoperative changes with an approximately 20% right  hydropneumothorax. 2.  No abscess or evidence of lung empyema. 3.  Minimal bibasilar atelectasis. 4.  Stable changes of COPD in the left lung.   Original Report Authenticated By: Beckie Salts, M.D.    Ct Angio Chest Pe W/cm &/or Wo Cm  11/27/2012  *RADIOLOGY REPORT*  Clinical Data: Pleuritic chest pain, worsening shortness of breath and fever.  History of right upper lobectomy and MAC.  CT ANGIOGRAPHY CHEST  Technique:  Multidetector CT imaging of the chest using the standard protocol during bolus administration  of intravenous contrast. Multiplanar reconstructed images including MIPs were obtained and reviewed to evaluate the vascular anatomy.  Contrast: 80mL OMNIPAQUE IOHEXOL 350 MG/ML SOLN  Comparison: CT of the chest performed 11/15/2012  Findings: There is no evidence of significant pulmonary embolus. Evaluation for pulmonary embolus is mildly suboptimal due to motion artifact.  There is a persistent small right-sided hydropneumothorax, most prominent at the right lung apex; this is grossly unchanged in appearance from the prior study.  Extensive postoperative change is noted along the right side of the mediastinum, with associated apparent dense sutures and underlying atelectasis.  Significant associated soft tissue density is of uncertain significance, tracking inferiorly along the posterior mediastinum.  The esophagus is obscured due to surrounding soft tissue attenuation.  The degree of soft tissue inflammation about the esophagus appears significantly worsened from the prior study; this could conceivably reflect diffuse esophagitis.  Previously noted dense airspace opacification within the inferior right middle lobe has improved.  There is a persistent somewhat loculated small right-sided pleural effusion; previously noted air components within the effusion are less prominent.  Scattered blebs are seen at the left lung apex, and underlying emphysema is noted throughout both lungs, more prominent at the  lung apices.  The mediastinum is otherwise grossly unremarkable, though the significance of diffuse soft tissue attenuation along the right side of the mediastinum and posterior mediastinum is uncertain.  No pericardial effusion is identified.  The great vessels are grossly unremarkable in appearance.  No definite mediastinal lymphadenopathy is seen.  No axillary lymphadenopathy is seen.  The thyroid gland is unremarkable in appearance.  The visualized portions of the liver and spleen are unremarkable.  No acute osseous abnormalities are seen.  Chronic right-sided rib deformity is noted.  IMPRESSION:  1.  No evidence of significant pulmonary embolus. 2.  Extensive postoperative change again noted along the right side of the mediastinum, with associated soft tissue density tracking inferiorly along the posterior mediastinum, of uncertain significance.  The esophagus is obscured due to surrounding soft tissue attenuation.  Would correlate for history of malignancy; this could simply reflect postoperative change.  The degree of soft tissue inflammation about the esophagus appears significantly worsened from the recent prior study.  This could conceivably reflect diffuse esophagitis.  3.  Persistent small right-sided hydropneumothorax, most prominent at the right lung apex.  Loculated components noted along the right lung. 4.  Previously noted dense consolidation within the inferior right middle lung lobe has improved. 5.  Scattered underlying emphysema noted, with blebs seen at the left lung apex. 6.  Chronic right-sided rib deformity again noted.   Original Report Authenticated By: Tonia Ghent, M.D.    Dg Chest Port 1 View  11/14/2012  *RADIOLOGY REPORT*  Clinical Data: Right lower lobe pneumonia.  Status post VATS.  PORTABLE CHEST - 1 VIEW  Comparison: 11/13/2012  Findings: 0616 hours right chest tube remains in place.  The apical component of the pneumothorax is still visible and not substantially changed in  the interval.  Some mild bibasilar atelectasis or infiltrate persist.  No overt pulmonary edema. Heart size is stable. Telemetry leads overlie the chest.  IMPRESSION: Stable exam.   Original Report Authenticated By: Kennith Center, M.D.    Dg Chest Port 1 View  11/13/2012  *RADIOLOGY REPORT*  Clinical Data: Right-sided chest tube  PORTABLE CHEST - 1 VIEW  Comparison: 11/12/2012; 11/07/2012; 08/12/2012; chest CT - 08/15/2012  Findings:  Grossly unchanged cardiac silhouette and mediastinal contours given reduced lung volumes of patient  rotation.  Interval removal of right jugular approach central venous catheter and the more inferior and laterally positioned right-sided chest tube.  The apically directed right-sided chest tube is unchanged in position. Grossly unchanged small right apical pneumothorax.  Heterogeneous air space opacities within the remaining right lung are grossly unchanged.  Grossly unchanged left basilar heterogeneous opacities. No new focal airspace opacity.  Query trace right-sided effusion, likely unchanged.  Unchanged bones.  IMPRESSION: 1.  Interval removal of support apparatus with unchanged positioning of remaining apically directed right-sided chest tube. Grossly unchanged small residual right-sided hydropneumothorax.  2.  Grossly unchanged heterogeneous air space opacities within the remaining right lung   Original Report Authenticated By: Tacey Ruiz, MD    Dg Chest Port 1 View  11/12/2012  *RADIOLOGY REPORT*  Clinical Data: Follow-up lobectomy.  PORTABLE CHEST - 1 VIEW  Comparison: 11/11/2012  Findings: Upper and lower right-sided chest tubes remain  in good position.  Approximately 10% right apical pneumothorax unchanged. Slight left basilar opacity may be minimally improved.  Central venous catheter tip unchanged at cavoatrial junction. Cardiomegaly.  Moderate airspace disease right lung stable, with right hemithorax volume loss.  IMPRESSION: Stable right apical pneumothorax (10%).   Slight improvement left basilar aeration.   Original Report Authenticated By: Davonna Belling, M.D.    Dg Chest Port 1 View  11/11/2012  *RADIOLOGY REPORT*  Clinical Data: 56 year old female - postoperative right lobectomy for cavitary lesion.  PORTABLE CHEST - 1 VIEW  Comparison: 11/10/2012 and prior chest radiographs dating back to 2009.  Findings: Right thoracic postoperative changes are again identified. Two separate right thoracostomy tubes and a right IJ central venous catheter with tip overlying the cavoatrial junction again noted. Right hemithorax volume loss is present with airspace opacities within the remaining right lung, slightly improved since the prior study. A small (10%) right apical pneumothorax is unchanged. Mild left basilar opacity is again noted question atelectasis versus developing airspace disease.  IMPRESSION: Improved right lung aeration with continued small right apical pneumothorax (10%).  Mild left basilar opacity again noted which may represent atelectasis or developing airspace disease/pneumonia.  Stable support apparatus as described.   Original Report Authenticated By: Harmon Pier, M.D.    Dg Chest Port 1 View  11/10/2012  *RADIOLOGY REPORT*  Clinical Data: Evaluate right chest tubes.  PORTABLE CHEST - 1 VIEW  Comparison: 11/09/2012  Findings: Stable position of the right chest drains.  There is stable right apical pneumothorax that roughly measures 15%. Persistent parenchymal densities throughout the right lung.  Left lung is clear with exception of left basilar atelectasis.  Heart and mediastinum are stable.  The jugular central venous catheter is in the lower SVC region.  IMPRESSION: No significant change in the size of the right pneumothorax. Stable patchy densities throughout the right lung.  New patchy densities at the left lung may represent atelectasis.   Original Report Authenticated By: Richarda Overlie, M.D.    Dg Chest Port 1 View  11/09/2012  *RADIOLOGY REPORT*  Clinical  Data: Chest tubes.  Pneumothorax.  PORTABLE CHEST - 1 VIEW  Comparison: Chest 11/08/2012.  Findings: Two right chest tubes and right IJ catheter remain in place.  Small right apical pneumothorax is unchanged in appearance. There has been increase in right pleural effusion.  Airspace disease throughout the right chest appears increased.  Volume loss on the right with shift of the mediastinal contents again from left to right is again seen.  Left lung is clear.  Cardiac silhouette is  partially obscured.  IMPRESSION:  1.  No change in a small right pneumothorax with a chest tube in place. 2.  Increased right effusion and airspace disease.   Original Report Authenticated By: Holley Dexter, M.D.    Dg Chest Port 1 View  11/08/2012  *RADIOLOGY REPORT*  Clinical Data: Post thoracotomy and lobectomy, evaluate pneumothorax  PORTABLE CHEST - 1 VIEW  Comparison: 11/07/2012; 11/06/2012; 11/05/2012  Findings:  Grossly unchanged cardiac silhouette and mediastinal contours with persistent deviation of the cardiomediastinal structures into the right hemithorax.  Stable position of support apparatus.  No change to minimal decrease in small to moderate-sized right-sided hydropneumothorax.  Worsening heterogeneous opacities in the remaining right lung.  The left hemithorax is grossly unchanged with minimal left basilar opacities favored to represent atelectasis.  Unchanged bones.  IMPRESSION: 1.  Stable positioning of support apparatus.  No change to minimal decrease in small to moderate-sized right-sided hydropneumothorax. 2.  Worsening heterogeneous opacities within the remaining right lung with differential considerations including atelectasis, edema or developing infection.  Continued attention on follow-up is recommended.   Original Report Authenticated By: Tacey Ruiz, MD    Dg Chest Port 1 View  11/07/2012  *RADIOLOGY REPORT*  Clinical Data: Status post right lobectomy.  PORTABLE CHEST - 1 VIEW  Comparison: One-view  chest 11/06/2012.  Findings: The heart size is normal.  The right apical pneumothorax has increased in size.  Lung density has increased on the right as well.  The chest tubes are stable.  Minimal atelectasis is present at the left base.  IMPRESSION:  1.  Increasing right apical pneumothorax. 2.  Increasing consolidation of the residual right lung. 3.  The support apparatus is stable.   Original Report Authenticated By: Marin Roberts, M.D.    Dg Chest Port 1 View  11/06/2012  *RADIOLOGY REPORT*  Clinical Data: VATS.  PORTABLE CHEST - 1 VIEW  Comparison: 11/05/2012  Findings: Two right chest tubes remain in place.  Postoperative changes in the right lung.  Suspect small residual right apical pneumothorax although the pleural edges difficult to visualize today's study.  Lucency around the superior chest tube may reflect a small residual pneumothorax.  No confluent airspace opacities in the lungs.  Heart is normal size.  Subcutaneous air in the right chest wall, slightly increased.  IMPRESSION: Two right chest tube remain in place.  Suspect tiny residual right apical pneumothorax around the superior chest tube.   Original Report Authenticated By: Charlett Nose, M.D.    Dg Chest Portable 1 View  11/05/2012  *RADIOLOGY REPORT*  Clinical Data: Postop.  Status post VATS.  PORTABLE CHEST - 1 VIEW  Comparison: 11/02/2012  Findings: Lungs are hyperinflated.  Right-sided IJ central line tip overlies the level of the superior vena cava.  Dual right-sided chest tubes are in place.  There is a small right apical pneumothorax, associated with apical pleural thickening and apical lung sutures.  Perihilar bronchitic changes are again noted.  No evidence for pulmonary edema.  IMPRESSION:  1.  Postoperative appearance of the chest. 2.  Small right apical pneumothorax.   Original Report Authenticated By: Norva Pavlov, M.D.     MEDICATIONS: Scheduled Meds:    . antiseptic oral rinse  15 mL Mouth Rinse BID  .  enoxaparin (LOVENOX) injection  40 mg Subcutaneous Q24H  . ethambutol  800 mg Oral Daily  . imipenem-cilastatin  250 mg Intravenous Q6H  . isoniazid  300 mg Oral Daily  . micafungin [facility]) IV  150 mg  Intravenous Daily  . pantoprazole (PROTONIX) IV  40 mg Intravenous Q12H  . vitamin B-6  50 mg Oral Daily  . rifampin  600 mg Oral Daily  . saccharomyces boulardii  250 mg Oral BID  . sodium chloride  3 mL Intravenous Q12H  . sucralfate  1 g Oral TID WC & HS  . vancomycin  1,000 mg Intravenous Q8H   Continuous Infusions:    . sodium chloride 50 mL/hr at 11/27/12 1957   PRN Meds:.acetaminophen, acetaminophen, alum & mag hydroxide-simeth, morphine injection, nicotine polacrilex, ondansetron (ZOFRAN) IV, ondansetron, oxyCODONE, traMADol  Antibiotics: Anti-infectives     Start     Dose/Rate Route Frequency Ordered Stop   11/27/12 1730   isoniazid (NYDRAZID) tablet 300 mg        300 mg Oral Daily 11/27/12 1723     11/27/12 1600   vancomycin (VANCOCIN) IVPB 1000 mg/200 mL premix        1,000 mg 200 mL/hr over 60 Minutes Intravenous Every 8 hours 11/27/12 1333     11/27/12 1400   micafungin (MYCAMINE) 150 mg in sodium chloride 0.9 % 100 mL IVPB        150 mg 100 mL/hr over 1 Hours Intravenous Daily 11/27/12 1251     11/27/12 1400   azithromycin (ZITHROMAX) tablet 500 mg  Status:  Discontinued        500 mg Oral Daily 11/27/12 1252 11/27/12 1724   11/27/12 1400   imipenem-cilastatin (PRIMAXIN) 250 mg in sodium chloride 0.9 % 100 mL IVPB        250 mg 200 mL/hr over 30 Minutes Intravenous 4 times per day 11/27/12 1333     11/27/12 1000   fluconazole (DIFLUCAN) tablet 100 mg  Status:  Discontinued        100 mg Oral Daily 11/26/12 2048 11/27/12 1248   11/27/12 1000   rifampin (RIFADIN) capsule 600 mg        600 mg Oral Daily 11/26/12 2048     11/27/12 0700   vancomycin (VANCOCIN) IVPB 1000 mg/200 mL premix  Status:  Discontinued        1,000 mg 200 mL/hr over 60 Minutes  Intravenous Every 8 hours 11/26/12 2042 11/27/12 1248   11/26/12 2200   clarithromycin (BIAXIN) tablet 1,000 mg  Status:  Discontinued        1,000 mg Oral Daily with supper 11/26/12 2048 11/27/12 1248   11/26/12 2200   ethambutol (MYAMBUTOL) tablet 800 mg        800 mg Oral Daily 11/26/12 2048     11/26/12 2200   ceFEPIme (MAXIPIME) 1 g in dextrose 5 % 50 mL IVPB  Status:  Discontinued        1 g 100 mL/hr over 30 Minutes Intravenous 3 times per day 11/26/12 2042 11/27/12 1248   11/26/12 2030   vancomycin (VANCOCIN) IVPB 1000 mg/200 mL premix        1,000 mg 200 mL/hr over 60 Minutes Intravenous  Once 11/26/12 2028 11/27/12 0029   11/26/12 1830   cefTRIAXone (ROCEPHIN) 1 g in dextrose 5 % 50 mL IVPB  Status:  Discontinued        1 g 100 mL/hr over 30 Minutes Intravenous Every 24 hours 11/26/12 1816 11/26/12 2048           Jeoffrey Massed, MD  [facility] Pager:[phone]  If 7PM-7AM, please contact [email or web address] [email or web address] Password [email or web address] 11/28/2012, 1:17 PM   LOS: 2 days

## 2012-11-29 DIAGNOSIS — K219 Gastro-esophageal reflux disease without esophagitis: Secondary | ICD-10-CM

## 2012-11-29 DIAGNOSIS — R509 Fever, unspecified: Secondary | ICD-10-CM

## 2012-11-29 DIAGNOSIS — A318 Other mycobacterial infections: Secondary | ICD-10-CM

## 2012-11-29 DIAGNOSIS — R1310 Dysphagia, unspecified: Secondary | ICD-10-CM

## 2012-11-29 LAB — BASIC METABOLIC PANEL
Chloride: 103 mEq/L (ref 96–112)
Creatinine, Ser: 0.42 mg/dL — ABNORMAL LOW (ref 0.50–1.10)
GFR calc Af Amer: >90 mL/min (ref >90)
Potassium: 3 mEq/L — ABNORMAL LOW (ref 3.5–5.1)

## 2012-11-29 LAB — CBC
MCV: 95.6 fL (ref 78.0–100.0)
Platelets: 380 10*3/uL (ref 150–400)
RDW: 13.7 % (ref 11.5–15.5)
WBC: 10.1 10*3/uL (ref 4.0–10.5)

## 2012-11-29 MED ORDER — GABAPENTIN 100 MG PO CAPS
200.0000 mg | ORAL_CAPSULE | Freq: Two times a day (BID) | ORAL | Status: DC
  Administered 2012-11-29 – 2012-12-04 (×9): 200 mg via ORAL
  Filled 2012-11-29 (×11): qty 2

## 2012-11-29 MED ORDER — POTASSIUM CHLORIDE CRYS ER 20 MEQ PO TBCR
60.0000 meq | EXTENDED_RELEASE_TABLET | Freq: Once | ORAL | Status: AC
  Administered 2012-11-29: 60 meq via ORAL
  Filled 2012-11-29: qty 3

## 2012-11-29 NOTE — Progress Notes (Signed)
PATIENT DETAILS Name: [name] [name] Age: 56 y.o. Sex: female Date of Birth: [birth year] Admit Date: 11/26/2012 Admitting Physician Standley Brooking, MD RUE:AVWU Drue Novel, MD  Subjective: Right sided Chest pain  better. Odynophagia better. Claims b/l hand/fingers better as well.  Assessment/Plan: Principal Problem:  *Pleuritic chest pain  - CTA of the chest negative for pulmonary embolism - CT of the chest showing a lot of postoperative changes, but also showing a soft tissue density in the posterior mediastinum. Seen by Dr Morton Peters from cardiothoracic surgery,does not think soft tissue density is of any significance -Per Dr Hebert Soho is likely 2/2 post thoractomy pain-suggested Ultram and Neurontin. 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 Luciana Axe- who presumed that this is a MAC infection (rather than mycobacterium TB- as PPD and Quantiferon were negative)- patient as a result was started on rifampin, ethambutol and Biaxin.  Esophagitis - No oral thrush-but at risk for candidal esophagitis - On Empiric Micafungin - Change PPI to twice a day -appreciate GI input  ? UTI -.Doubt she has UTI - Urine cultures negative  Diarrhea - Stool C. 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) - On triple antibiotic regimen-Biaxin, rifampin and ethambutol -awaiting definite cultures   Anemia - Suspect this is chronic-from  chronic disease - Monitor H&H periodically - Anemia panel if hemoglobin drops further-but is stable  Hypokalemia -replete and recheck in am  GERD - Continue with PPI- but change to twice a day given esophageal thickening seen on the CT  Disposition: Remain inpatient  DVT Prophylaxis: Prophylactic Lovenox   Code Status: Full code  Procedures:  None  CONSULTS:  ID  PHYSICAL EXAM: Vital signs in last 24 hours: Filed Vitals:   11/28/12 0502 11/28/12 1400 11/28/12 2046 11/29/12 0456  BP: 106/66 104/66 95/57 93/59   Pulse: 88 93 89 83  Temp: 98.7 F (37.1 C) 99.3 F (37.4 C) 99.3 F (37.4 C) 98.5 F (36.9 C)  TempSrc: Oral Oral Oral Oral  Resp: 20 20 20 20   Height:      Weight:      SpO2: 99% 99% 100% 99%    Weight change:  Body mass index is 18.08 kg/(m^2).   Gen Exam: Awake and alert with clear speech.   Neck: Supple, No JVD.   Chest: B/L Clear.   CVS: S1 S2 Regular, no murmurs.  Abdomen: soft, BS +, non tender, non distended.  Extremities: no edema, lower extremities warm to touch.No swelling or redness noted in any of her fingers-now able to make a fist in both hands Neurologic: Non Focal.   Skin: No Rash.   Wounds: N/A.    Intake/Output from previous day:  Intake/Output Summary (Last 24 hours) at 11/29/12 1307 Last data filed at 11/29/12 0900  Gross per 24 hour  Intake   1386 ml  Output      0 ml  Net   1386 ml     LAB RESULTS: CBC  Lab 11/29/12 0715 11/28/12  1610 11/27/12 0556 11/26/12 1631  WBC 10.1 16.7* 9.6 14.6*  HGB 9.2* 9.7* 9.5* 9.7*  HCT 28.2* 29.3* 29.6* 29.3*  PLT 380 423* 473* 505*  MCV 95.6 94.8 96.7 95.8  MCH 31.2 31.4 31.0 31.7  MCHC 32.6 33.1 32.1 33.1  RDW 13.7 13.5 13.6 13.5  LYMPHSABS -- -- -- 0.6*  MONOABS -- -- -- 1.0  EOSABS -- -- -- 0.0  BASOSABS -- -- -- 0.0  BANDABS -- -- -- --    Chemistries   Lab 11/29/12 0715 11/28/12 0644 11/27/12 0556 11/26/12 1631  NA 140 135 135 132*  K 3.0* 3.0* 3.4* 3.8  CL 103  97 99 97  CO2 25 25 24 20   GLUCOSE 119* 118* 114* 117*  BUN 4* 4* 4* 4*  CREATININE 0.42* 0.45* 0.45* 0.41*  CALCIUM 8.4 8.8 8.8 8.7  MG -- -- -- --    CBG: No results found for this basename: GLUCAP:5 in the last 168 hours  GFR Estimated Creatinine Clearance: 64.5 ml/min (by C-G formula based on Cr of 0.42).  Coagulation profile No results found for this basename: INR:5,PROTIME:5 in the last 168 hours  Cardiac Enzymes  Lab 11/27/12 1200  CKMB --  TROPONINI <0.30  MYOGLOBIN --    No components found with this basename: POCBNP:3 No results found for this basename: DDIMER:2 in the last 72 hours No results found for this basename: HGBA1C:2 in the last 72 hours No results found for this basename: CHOL:2,HDL:2,LDLCALC:2,TRIG:2,CHOLHDL:2,LDLDIRECT:2 in the last 72 hours No results found for this basename: TSH,T4TOTAL,FREET3,T3FREE,THYROIDAB in the last 72 hours No results found for this basename: VITAMINB12:2,FOLATE:2,FERRITIN:2,TIBC:2,IRON:2,RETICCTPCT:2 in the last 72 hours No results found for this basename: LIPASE:2,AMYLASE:2 in the last 72 hours  Urine Studies No results found for this basename: UACOL:2,UAPR:2,USPG:2,UPH:2,UTP:2,UGL:2,UKET:2,UBIL:2,UHGB:2,UNIT:2,UROB:2,ULEU:2,UEPI:2,UWBC:2,URBC:2,UBAC:2,CAST:2,CRYS:2,UCOM:2,BILUA:2 in the last 72 hours  MICROBIOLOGY: Recent Results (from the past 240 hour(s))  URINE CULTURE     Status: Normal   Collection Time   11/26/12  6:13 PM      Component Value Range Status Comment   Specimen Description URINE, CLEAN CATCH   Final    Special Requests NONE   Final    Culture  Setup Time 11/26/2012 20:02   Final    Colony Count NO GROWTH   Final    Culture NO GROWTH   Final    Report Status 11/27/2012 FINAL   Final   CLOSTRIDIUM DIFFICILE BY PCR     Status: Normal   Collection Time   11/26/12 10:32 PM      Component Value Range Status Comment   C difficile by pcr NEGATIVE  NEGATIVE Final   CULTURE, EXPECTORATED  SPUTUM-ASSESSMENT     Status: Normal   Collection Time   11/27/12 11:47 PM      Component Value Range Status Comment   Specimen Description SPUTUM   Final    Special Requests NONE   Final    Sputum evaluation     Final    Value: MICROSCOPIC FINDINGS SUGGEST THAT THIS SPECIMEN IS NOT REPRESENTATIVE OF LOWER RESPIRATORY SECRETIONS. PLEASE RECOLLECT.     CALLED TO RN [facility] AT 0148 11/28/12 LGP   Report Status 11/28/2012 FINAL   Final     RADIOLOGY STUDIES/RESULTS: Dg Chest 2 View  11/26/2012  *RADIOLOGY REPORT*  Clinical Data: Fever  CHEST - 2 VIEW  Comparison: 09/2012  Findings: Interstitial prominence.  Bronchitic changes.  Increased AP diameter of the chest.  Mild cardiomegaly.  Normal pulmonary vascularity.  Loculated right  apical hydropneumothorax has improved.  Heterogeneous opacities throughout the right mid and lower lung zones have improved.  Minimal residual subsegmental atelectasis at the left base.  IMPRESSION: Improved loculated right hydropneumothorax.  Improved heterogeneous pulmonary opacities in the right mid and lower lung zones.   Original Report Authenticated By: Jolaine Click, M.D.    Dg Chest 2 View  11/16/2012  *RADIOLOGY REPORT*  Clinical Data: Pneumonia  CHEST - 2 VIEW  Comparison: Yesterday  Findings: Right chest tube has been removed.  Stable right hydropneumothorax.  Opacities within the right lower lung zone and right suprahilar density are stable.  Interstitial prominence throughout the left lung with hyperaeration is stable.  No left pneumothorax.  Normal heart size.  IMPRESSION: Stable right hydropneumothorax after chest tube removal.   Original Report Authenticated By: Jolaine Click, M.D.    Dg Chest 2 View  11/15/2012  *RADIOLOGY REPORT*  Clinical Data: Shortness of breath, follow up pneumonia  CHEST - 2 VIEW  Comparison: Portable chest x-ray of 11/14/2012 and 11/13/2012  Findings: Aeration has improved slightly.  There is still right apical pneumothorax  present with right chest tube unchanged in position.  Right perihilar opacity remains although opacity noted previously at the right lung base has improved.  The heart is within normal limits in size.  IMPRESSION:  1.  Stable right apical pneumothorax with right chest tube remaining. 2.  Some improvement in opacity at the right lung base with haziness overlying the right hilum remaining.   Original Report Authenticated By: Dwyane Dee, M.D.    Dg Chest 2 View  11/02/2012  *RADIOLOGY REPORT*  Clinical Data: Preop.  CHEST - 2 VIEW  Comparison: 08/12/2012  Findings: Right apical pleural/parenchymal density and cavitation again noted, which may be slightly progressed since prior study. Left apical scarring noted.  Heart is normal size.  No effusions. No acute bony abnormality.  IMPRESSION: Right apical density may have progressed slightly since prior study.   Original Report Authenticated By: Charlett Nose, M.D.    Ct Chest W Contrast  11/15/2012  *RADIOLOGY REPORT*  Clinical Data: Fever and right chest pain.  Status post right lobectomy.  Smoker.  CT CHEST WITH CONTRAST  Technique:  Multidetector CT imaging of the chest was performed following the standard protocol during bolus administration of intravenous contrast.  Contrast: 80mL OMNIPAQUE IOHEXOL 300 MG/ML  SOLN  Comparison: Chestradiographs obtained earlier today and chest CT dated 08/15/2012.  Findings: Interval resection of the cavitary mass in the right upper lobe with multiple surgical staple lines and surgical clips. Approximately 20% right hydropneumothorax.  A portion of this is located at the apex and another portion is posteromedially located and appears partially loculated.  No fluid collections with peripheral rim enhancement are seen.  There is minimal atelectasis at both lung bases.  Bullous changes in the left lung are again demonstrated, remaining most pronounced at the medial aspect of the left upper lobe.  No lung masses or enlarged lymph  nodes are seen.  A 1 cm cyst in the right lobe of the liver is unchanged.  Mild thoracic spine degenerative changes.  IMPRESSION:  1.  Right postoperative changes with an approximately 20% right hydropneumothorax. 2.  No abscess or evidence of lung empyema. 3.  Minimal bibasilar atelectasis. 4.  Stable changes of COPD in the left lung.   Original Report Authenticated By: Beckie Salts, M.D.    Ct Angio Chest Pe W/cm &/or Wo Cm  11/27/2012  *RADIOLOGY REPORT*  Clinical Data: Pleuritic  chest pain, worsening shortness of breath and fever.  History of right upper lobectomy and MAC.  CT ANGIOGRAPHY CHEST  Technique:  Multidetector CT imaging of the chest using the standard protocol during bolus administration of intravenous contrast. Multiplanar reconstructed images including MIPs were obtained and reviewed to evaluate the vascular anatomy.  Contrast: 80mL OMNIPAQUE IOHEXOL 350 MG/ML SOLN  Comparison: CT of the chest performed 11/15/2012  Findings: There is no evidence of significant pulmonary embolus. Evaluation for pulmonary embolus is mildly suboptimal due to motion artifact.  There is a persistent small right-sided hydropneumothorax, most prominent at the right lung apex; this is grossly unchanged in appearance from the prior study.  Extensive postoperative change is noted along the right side of the mediastinum, with associated apparent dense sutures and underlying atelectasis.  Significant associated soft tissue density is of uncertain significance, tracking inferiorly along the posterior mediastinum.  The esophagus is obscured due to surrounding soft tissue attenuation.  The degree of soft tissue inflammation about the esophagus appears significantly worsened from the prior study; this could conceivably reflect diffuse esophagitis.  Previously noted dense airspace opacification within the inferior right middle lobe has improved.  There is a persistent somewhat loculated small right-sided pleural effusion;  previously noted air components within the effusion are less prominent.  Scattered blebs are seen at the left lung apex, and underlying emphysema is noted throughout both lungs, more prominent at the lung apices.  The mediastinum is otherwise grossly unremarkable, though the significance of diffuse soft tissue attenuation along the right side of the mediastinum and posterior mediastinum is uncertain.  No pericardial effusion is identified.  The great vessels are grossly unremarkable in appearance.  No definite mediastinal lymphadenopathy is seen.  No axillary lymphadenopathy is seen.  The thyroid gland is unremarkable in appearance.  The visualized portions of the liver and spleen are unremarkable.  No acute osseous abnormalities are seen.  Chronic right-sided rib deformity is noted.  IMPRESSION:  1.  No evidence of significant pulmonary embolus. 2.  Extensive postoperative change again noted along the right side of the mediastinum, with associated soft tissue density tracking inferiorly along the posterior mediastinum, of uncertain significance.  The esophagus is obscured due to surrounding soft tissue attenuation.  Would correlate for history of malignancy; this could simply reflect postoperative change.  The degree of soft tissue inflammation about the esophagus appears significantly worsened from the recent prior study.  This could conceivably reflect diffuse esophagitis.  3.  Persistent small right-sided hydropneumothorax, most prominent at the right lung apex.  Loculated components noted along the right lung. 4.  Previously noted dense consolidation within the inferior right middle lung lobe has improved. 5.  Scattered underlying emphysema noted, with blebs seen at the left lung apex. 6.  Chronic right-sided rib deformity again noted.   Original Report Authenticated By: Tonia Ghent, M.D.    Dg Chest Port 1 View  11/14/2012  *RADIOLOGY REPORT*  Clinical Data: Right lower lobe pneumonia.  Status post VATS.   PORTABLE CHEST - 1 VIEW  Comparison: 11/13/2012  Findings: 0616 hours right chest tube remains in place.  The apical component of the pneumothorax is still visible and not substantially changed in the interval.  Some mild bibasilar atelectasis or infiltrate persist.  No overt pulmonary edema. Heart size is stable. Telemetry leads overlie the chest.  IMPRESSION: Stable exam.   Original Report Authenticated By: Kennith Center, M.D.    Dg Chest Port 1 View  11/13/2012  *RADIOLOGY REPORT*  Clinical Data: Right-sided chest tube  PORTABLE CHEST - 1 VIEW  Comparison: 11/12/2012; 11/07/2012; 08/12/2012; chest CT - 08/15/2012  Findings:  Grossly unchanged cardiac silhouette and mediastinal contours given reduced lung volumes of patient rotation.  Interval removal of right jugular approach central venous catheter and the more inferior and laterally positioned right-sided chest tube.  The apically directed right-sided chest tube is unchanged in position. Grossly unchanged small right apical pneumothorax.  Heterogeneous air space opacities within the remaining right lung are grossly unchanged.  Grossly unchanged left basilar heterogeneous opacities. No new focal airspace opacity.  Query trace right-sided effusion, likely unchanged.  Unchanged bones.  IMPRESSION: 1.  Interval removal of support apparatus with unchanged positioning of remaining apically directed right-sided chest tube. Grossly unchanged small residual right-sided hydropneumothorax.  2.  Grossly unchanged heterogeneous air space opacities within the remaining right lung   Original Report Authenticated By: Tacey Ruiz, MD    Dg Chest Port 1 View  11/12/2012  *RADIOLOGY REPORT*  Clinical Data: Follow-up lobectomy.  PORTABLE CHEST - 1 VIEW  Comparison: 11/11/2012  Findings: Upper and lower right-sided chest tubes remain  in good position.  Approximately 10% right apical pneumothorax unchanged. Slight left basilar opacity may be minimally improved.  Central  venous catheter tip unchanged at cavoatrial junction. Cardiomegaly.  Moderate airspace disease right lung stable, with right hemithorax volume loss.  IMPRESSION: Stable right apical pneumothorax (10%).  Slight improvement left basilar aeration.   Original Report Authenticated By: Davonna Belling, M.D.    Dg Chest Port 1 View  11/11/2012  *RADIOLOGY REPORT*  Clinical Data: 56 year old female - postoperative right lobectomy for cavitary lesion.  PORTABLE CHEST - 1 VIEW  Comparison: 11/10/2012 and prior chest radiographs dating back to 2009.  Findings: Right thoracic postoperative changes are again identified. Two separate right thoracostomy tubes and a right IJ central venous catheter with tip overlying the cavoatrial junction again noted. Right hemithorax volume loss is present with airspace opacities within the remaining right lung, slightly improved since the prior study. A small (10%) right apical pneumothorax is unchanged. Mild left basilar opacity is again noted question atelectasis versus developing airspace disease.  IMPRESSION: Improved right lung aeration with continued small right apical pneumothorax (10%).  Mild left basilar opacity again noted which may represent atelectasis or developing airspace disease/pneumonia.  Stable support apparatus as described.   Original Report Authenticated By: Harmon Pier, M.D.    Dg Chest Port 1 View  11/10/2012  *RADIOLOGY REPORT*  Clinical Data: Evaluate right chest tubes.  PORTABLE CHEST - 1 VIEW  Comparison: 11/09/2012  Findings: Stable position of the right chest drains.  There is stable right apical pneumothorax that roughly measures 15%. Persistent parenchymal densities throughout the right lung.  Left lung is clear with exception of left basilar atelectasis.  Heart and mediastinum are stable.  The jugular central venous catheter is in the lower SVC region.  IMPRESSION: No significant change in the size of the right pneumothorax. Stable patchy densities throughout  the right lung.  New patchy densities at the left lung may represent atelectasis.   Original Report Authenticated By: Richarda Overlie, M.D.    Dg Chest Port 1 View  11/09/2012  *RADIOLOGY REPORT*  Clinical Data: Chest tubes.  Pneumothorax.  PORTABLE CHEST - 1 VIEW  Comparison: Chest 11/08/2012.  Findings: Two right chest tubes and right IJ catheter remain in place.  Small right apical pneumothorax is unchanged in appearance. There has been increase in right pleural effusion.  Airspace  disease throughout the right chest appears increased.  Volume loss on the right with shift of the mediastinal contents again from left to right is again seen.  Left lung is clear.  Cardiac silhouette is partially obscured.  IMPRESSION:  1.  No change in a small right pneumothorax with a chest tube in place. 2.  Increased right effusion and airspace disease.   Original Report Authenticated By: Holley Dexter, M.D.    Dg Chest Port 1 View  11/08/2012  *RADIOLOGY REPORT*  Clinical Data: Post thoracotomy and lobectomy, evaluate pneumothorax  PORTABLE CHEST - 1 VIEW  Comparison: 11/07/2012; 11/06/2012; 11/05/2012  Findings:  Grossly unchanged cardiac silhouette and mediastinal contours with persistent deviation of the cardiomediastinal structures into the right hemithorax.  Stable position of support apparatus.  No change to minimal decrease in small to moderate-sized right-sided hydropneumothorax.  Worsening heterogeneous opacities in the remaining right lung.  The left hemithorax is grossly unchanged with minimal left basilar opacities favored to represent atelectasis.  Unchanged bones.  IMPRESSION: 1.  Stable positioning of support apparatus.  No change to minimal decrease in small to moderate-sized right-sided hydropneumothorax. 2.  Worsening heterogeneous opacities within the remaining right lung with differential considerations including atelectasis, edema or developing infection.  Continued attention on follow-up is recommended.    Original Report Authenticated By: Tacey Ruiz, MD    Dg Chest Port 1 View  11/07/2012  *RADIOLOGY REPORT*  Clinical Data: Status post right lobectomy.  PORTABLE CHEST - 1 VIEW  Comparison: One-view chest 11/06/2012.  Findings: The heart size is normal.  The right apical pneumothorax has increased in size.  Lung density has increased on the right as well.  The chest tubes are stable.  Minimal atelectasis is present at the left base.  IMPRESSION:  1.  Increasing right apical pneumothorax. 2.  Increasing consolidation of the residual right lung. 3.  The support apparatus is stable.   Original Report Authenticated By: Marin Roberts, M.D.    Dg Chest Port 1 View  11/06/2012  *RADIOLOGY REPORT*  Clinical Data: VATS.  PORTABLE CHEST - 1 VIEW  Comparison: 11/05/2012  Findings: Two right chest tubes remain in place.  Postoperative changes in the right lung.  Suspect small residual right apical pneumothorax although the pleural edges difficult to visualize today's study.  Lucency around the superior chest tube may reflect a small residual pneumothorax.  No confluent airspace opacities in the lungs.  Heart is normal size.  Subcutaneous air in the right chest wall, slightly increased.  IMPRESSION: Two right chest tube remain in place.  Suspect tiny residual right apical pneumothorax around the superior chest tube.   Original Report Authenticated By: Charlett Nose, M.D.    Dg Chest Portable 1 View  11/05/2012  *RADIOLOGY REPORT*  Clinical Data: Postop.  Status post VATS.  PORTABLE CHEST - 1 VIEW  Comparison: 11/02/2012  Findings: Lungs are hyperinflated.  Right-sided IJ central line tip overlies the level of the superior vena cava.  Dual right-sided chest tubes are in place.  There is a small right apical pneumothorax, associated with apical pleural thickening and apical lung sutures.  Perihilar bronchitic changes are again noted.  No evidence for pulmonary edema.  IMPRESSION:  1.  Postoperative appearance of the  chest. 2.  Small right apical pneumothorax.   Original Report Authenticated By: Norva Pavlov, M.D.     MEDICATIONS: Scheduled Meds:    . antiseptic oral rinse  15 mL Mouth Rinse BID  . enoxaparin (LOVENOX) injection  40 mg Subcutaneous Q24H  . ethambutol  800 mg Oral Daily  . feeding supplement  1 Container Oral TID BM  . gabapentin  200 mg Oral BID  . imipenem-cilastatin  250 mg Intravenous Q6H  . isoniazid  300 mg Oral Daily  . micafungin [facility]) IV  150 mg Intravenous Daily  . pantoprazole (PROTONIX) IV  40 mg Intravenous Q12H  . vitamin B-6  50 mg Oral Daily  . rifampin  600 mg Oral Daily  . saccharomyces boulardii  250 mg Oral BID  . sodium chloride  3 mL Intravenous Q12H  . sucralfate  1 g Oral TID WC & HS  . vancomycin  1,000 mg Intravenous Q8H   Continuous Infusions:    . sodium chloride 1,000 mL (11/28/12 1938)   PRN Meds:.acetaminophen, acetaminophen, alum & mag hydroxide-simeth, morphine injection, nicotine polacrilex, ondansetron (ZOFRAN) IV, ondansetron, oxyCODONE, traMADol  Antibiotics: Anti-infectives     Start     Dose/Rate Route Frequency Ordered Stop   11/27/12 1730   isoniazid (NYDRAZID) tablet 300 mg        300 mg Oral Daily 11/27/12 1723     11/27/12 1600   vancomycin (VANCOCIN) IVPB 1000 mg/200 mL premix        1,000 mg 200 mL/hr over 60 Minutes Intravenous Every 8 hours 11/27/12 1333     11/27/12 1400   micafungin (MYCAMINE) 150 mg in sodium chloride 0.9 % 100 mL IVPB        150 mg 100 mL/hr over 1 Hours Intravenous Daily 11/27/12 1251     11/27/12 1400   azithromycin (ZITHROMAX) tablet 500 mg  Status:  Discontinued        500 mg Oral Daily 11/27/12 1252 11/27/12 1724   11/27/12 1400   imipenem-cilastatin (PRIMAXIN) 250 mg in sodium chloride 0.9 % 100 mL IVPB        250 mg 200 mL/hr over 30 Minutes Intravenous 4 times per day 11/27/12 1333     11/27/12 1000   fluconazole (DIFLUCAN) tablet 100 mg  Status:  Discontinued        100 mg  Oral Daily 11/26/12 2048 11/27/12 1248   11/27/12 1000   rifampin (RIFADIN) capsule 600 mg        600 mg Oral Daily 11/26/12 2048     11/27/12 0700   vancomycin (VANCOCIN) IVPB 1000 mg/200 mL premix  Status:  Discontinued        1,000 mg 200 mL/hr over 60 Minutes Intravenous Every 8 hours 11/26/12 2042 11/27/12 1248   11/26/12 2200   clarithromycin (BIAXIN) tablet 1,000 mg  Status:  Discontinued        1,000 mg Oral Daily with supper 11/26/12 2048 11/27/12 1248   11/26/12 2200   ethambutol (MYAMBUTOL) tablet 800 mg        800 mg Oral Daily 11/26/12 2048     11/26/12 2200   ceFEPIme (MAXIPIME) 1 g in dextrose 5 % 50 mL IVPB  Status:  Discontinued        1 g 100 mL/hr over 30 Minutes Intravenous 3 times per day 11/26/12 2042 11/27/12 1248   11/26/12 2030   vancomycin (VANCOCIN) IVPB 1000 mg/200 mL premix        1,000 mg 200 mL/hr over 60 Minutes Intravenous  Once 11/26/12 2028 11/27/12 0029   11/26/12 1830   cefTRIAXone (ROCEPHIN) 1 g in dextrose 5 % 50 mL IVPB  Status:  Discontinued  1 g 100 mL/hr over 30 Minutes Intravenous Every 24 hours 11/26/12 1816 11/26/12 2048           Jeoffrey Massed, MD  [facility] Pager:[phone]  If 7PM-7AM, please contact night-coverage [email or web address] Password [email or web address] 11/29/2012, 1:07 PM   LOS: 3 days

## 2012-11-29 NOTE — Progress Notes (Signed)
Pt.c/o difficulty swallowing  & seemed to be choking on water.MD made aware  & changed pain med.to IV Morphine.

## 2012-11-29 NOTE — Progress Notes (Signed)
INFECTIOUS DISEASE PROGRESS NOTE  ID: [name] [name] is a 56 y.o. female with   Principal Problem:  *Pleuritic chest pain Active Problems:  Fever  MAC (mycobacterium avium-intracellulare complex)  Diarrhea  Anemia  Odynophagia  Subjective: Less throat and chest pain. Hand swelling better.   Abtx:  Anti-infectives     Start     Dose/Rate Route Frequency Ordered Stop   11/27/12 1730   isoniazid (NYDRAZID) tablet 300 mg        300 mg Oral Daily 11/27/12 1723     11/27/12 1600   vancomycin (VANCOCIN) IVPB 1000 mg/200 mL premix        1,000 mg 200 mL/hr over 60 Minutes Intravenous Every 8 hours 11/27/12 1333     11/27/12 1400   micafungin (MYCAMINE) 150 mg in sodium chloride 0.9 % 100 mL IVPB        150 mg 100 mL/hr over 1 Hours Intravenous Daily 11/27/12 1251     11/27/12 1400   azithromycin (ZITHROMAX) tablet 500 mg  Status:  Discontinued        500 mg Oral Daily 11/27/12 1252 11/27/12 1724   11/27/12 1400   imipenem-cilastatin (PRIMAXIN) 250 mg in sodium chloride 0.9 % 100 mL IVPB        250 mg 200 mL/hr over 30 Minutes Intravenous 4 times per day 11/27/12 1333     11/27/12 1000   fluconazole (DIFLUCAN) tablet 100 mg  Status:  Discontinued        100 mg Oral Daily 11/26/12 2048 11/27/12 1248   11/27/12 1000   rifampin (RIFADIN) capsule 600 mg        600 mg Oral Daily 11/26/12 2048     11/27/12 0700   vancomycin (VANCOCIN) IVPB 1000 mg/200 mL premix  Status:  Discontinued        1,000 mg 200 mL/hr over 60 Minutes Intravenous Every 8 hours 11/26/12 2042 11/27/12 1248   11/26/12 2200   clarithromycin (BIAXIN) tablet 1,000 mg  Status:  Discontinued        1,000 mg Oral Daily with supper 11/26/12 2048 11/27/12 1248   11/26/12 2200   ethambutol (MYAMBUTOL) tablet 800 mg        800 mg Oral Daily 11/26/12 2048     11/26/12 2200   ceFEPIme (MAXIPIME) 1 g in dextrose 5 % 50 mL IVPB  Status:  Discontinued        1 g 100 mL/hr over 30 Minutes Intravenous 3 times per day  11/26/12 2042 11/27/12 1248   11/26/12 2030   vancomycin (VANCOCIN) IVPB 1000 mg/200 mL premix        1,000 mg 200 mL/hr over 60 Minutes Intravenous  Once 11/26/12 2028 11/27/12 0029   11/26/12 1830   cefTRIAXone (ROCEPHIN) 1 g in dextrose 5 % 50 mL IVPB  Status:  Discontinued        1 g 100 mL/hr over 30 Minutes Intravenous Every 24 hours 11/26/12 1816 11/26/12 2048          Medications:  Scheduled:   . antiseptic oral rinse  15 mL Mouth Rinse BID  . enoxaparin (LOVENOX) injection  40 mg Subcutaneous Q24H  . ethambutol  800 mg Oral Daily  . feeding supplement  1 Container Oral TID BM  . gabapentin  200 mg Oral BID  . imipenem-cilastatin  250 mg Intravenous Q6H  . isoniazid  300 mg Oral Daily  . micafungin [facility]) IV  150 mg Intravenous Daily  .  pantoprazole (PROTONIX) IV  40 mg Intravenous Q12H  . vitamin B-6  50 mg Oral Daily  . rifampin  600 mg Oral Daily  . saccharomyces boulardii  250 mg Oral BID  . sodium chloride  3 mL Intravenous Q12H  . sucralfate  1 g Oral TID WC & HS  . vancomycin  1,000 mg Intravenous Q8H    Objective: Vital signs in last 24 hours: Temp:  [98.5 F (36.9 C)-99.3 F (37.4 C)] 98.5 F (36.9 C) (12/26 0456) Pulse Rate:  [83-89] 83  (12/26 0456) Resp:  [20] 20  (12/26 0456) BP: (93-95)/(57-59) 93/59 mmHg (12/26 0456) SpO2:  [99 %-100 %] 99 % (12/26 0456)   General appearance: alert, cooperative and no distress Resp: clear to auscultation bilaterally Cardio: regular rate and rhythm GI: normal findings: bowel sounds normal and soft, non-tender Extremities: edema trace  Lab Results  [facility] 11/29/12 0715 11/28/12 0644  WBC 10.1 16.7*  HGB 9.2* 9.7*  HCT 28.2* 29.3*  NA 140 135  K 3.0* 3.0*  CL 103 97  CO2 25 25  BUN 4* 4*  CREATININE 0.42* 0.45*  GLU -- --   Liver Panel  Basename 11/28/12 0644 11/26/12 1631  PROT 6.4 7.1  ALBUMIN 2.0* 2.5*  AST 10 18  ALT 8 12  ALKPHOS 163* 167*  BILITOT 0.4 0.5  BILIDIR -- --  IBILI  -- --   Sedimentation Rate No results found for this basename: ESRSEDRATE in the last 72 hours C-Reactive Protein No results found for this basename: CRP:2 in the last 72 hours  Microbiology: Recent Results (from the past 240 hour(s))  URINE CULTURE     Status: Normal   Collection Time   11/26/12  6:13 PM      Component Value Range Status Comment   Specimen Description URINE, CLEAN CATCH   Final    Special Requests NONE   Final    Culture  Setup Time 11/26/2012 20:02   Final    Colony Count NO GROWTH   Final    Culture NO GROWTH   Final    Report Status 11/27/2012 FINAL   Final   CLOSTRIDIUM DIFFICILE BY PCR     Status: Normal   Collection Time   11/26/12 10:32 PM      Component Value Range Status Comment   C difficile by pcr NEGATIVE  NEGATIVE Final   CULTURE, EXPECTORATED SPUTUM-ASSESSMENT     Status: Normal   Collection Time   11/27/12 11:47 PM      Component Value Range Status Comment   Specimen Description SPUTUM   Final    Special Requests NONE   Final    Sputum evaluation     Final    Value: MICROSCOPIC FINDINGS SUGGEST THAT THIS SPECIMEN IS NOT REPRESENTATIVE OF LOWER RESPIRATORY SECRETIONS. PLEASE RECOLLECT.     CALLED TO RN [facility] AT 0148 11/28/12 LGP   Report Status 11/28/2012 FINAL   Final     Studies/Results: No results found.   Assessment/Plan: Non-tuberculous Mycobacteria. (NTM)  ? HCAP  Esophagitis, post-operative mediastinal changes  Loculated areas R Lung, R hydropneumothorax  Hand swelling   Total days of antibiotics 7 (INH/Rifampin/ETH/Azithro)  Day 3 Imipenem/Vanco/Mycfungin  Would- cont her therapy for NTM  WBC better.  F/u CXR  consdier tapering her anbx as she improves (stop vanco today).      Johny Sax Infectious Diseases [phone] 11/29/2012, 2:54 PM   LOS: 3 days

## 2012-11-29 NOTE — Evaluation (Signed)
Physical Therapy Evaluation Patient Details Name: [name] [name] MRN: [id number] DOB: 20-Jul-1956 Today's Date: 11/29/2012 Time: 1308-6578 PT Time Calculation (min): 32 min  PT Assessment / Plan / Recommendation Clinical Impression  Patient is a 56 yo female admitted with fever, pleuritic chest pain who underwent lobectomy in early December for CA.  Patient with general weakness and dyspnea impacting balance and functional mobility.  Patient will benefit from acute PT to maximize independence prior to return home with family.    PT Assessment  Patient needs continued PT services    Follow Up Recommendations  Home health PT;Supervision/Assistance - 24 hour    Does the patient have the potential to tolerate intense rehabilitation      Barriers to Discharge None Two sisters will be caring for patient and their mother 24/7    Equipment Recommendations  None recommended by PT    Recommendations for Other Services     Frequency Min 3X/week    Precautions / Restrictions Precautions Precautions: Fall (due to general weakness) Restrictions Weight Bearing Restrictions: No   Pertinent Vitals/Pain       Mobility  Bed Mobility Bed Mobility: Supine to Sit;Sit to Supine Supine to Sit: 5: Supervision;With rails;HOB flat Sit to Supine: 5: Supervision;HOB flat Details for Bed Mobility Assistance: Verbal cues for technique and safety. Transfers Transfers: Sit to Stand;Stand to Sit Sit to Stand: 4: Min guard;With upper extremity assist;From bed Stand to Sit: 4: Min guard;With upper extremity assist;To bed Details for Transfer Assistance: Verbal cues for safety during transitions Ambulation/Gait Ambulation/Gait Assistance: 4: Min guard Ambulation Distance (Feet): 200 Feet Assistive device: None Ambulation/Gait Assistance Details: Verbal cues to look up and move at safe pace during gait.  Patient with slight unsteadiness - staggering x2 without loss of balance.  Dyspnea 2/4 with  gait. Gait Pattern: Step-through pattern;Decreased stride length;Trunk flexed Gait velocity: Slow gait speed           PT Diagnosis: Abnormality of gait;Generalized weakness  PT Problem List: Decreased strength;Decreased activity tolerance;Decreased balance;Decreased mobility;Cardiopulmonary status limiting activity PT Treatment Interventions: Gait training;Functional mobility training;Therapeutic exercise;Balance training;Patient/family education   PT Goals Acute Rehab PT Goals PT Goal Formulation: With patient Time For Goal Achievement: 12/06/12 Potential to Achieve Goals: Good Pt will go Sit to Stand: Independently PT Goal: Sit to Stand - Progress: Goal set today Pt will Ambulate: >150 feet;with supervision (without loss of balance) PT Goal: Ambulate - Progress: Goal set today Additional Goals Additional Goal #1: Patient will score > 19 on DGI balance assessment to indicate low fall risk. PT Goal: Additional Goal #1 - Progress: Goal set today  Visit Information  Last PT Received On: 11/29/12 Assistance Needed: +1    Subjective Data  Subjective: "I'd love to walk" Patient Stated Goal: To go home soon   Prior Functioning  Home Living Lives With: Family (Patient cares for mother; Sisters will be 24/7 for both) Available Help at Discharge: Family;Available 24 hours/day Type of Home: House Home Access: Stairs to enter [employer] of Steps: 1 Entrance Stairs-Rails: None Home Layout: Two level;Able to live on main level with bedroom/bathroom Bathroom Shower/Tub: Tub/shower unit;[employer]: Standard Home Adaptive Equipment: Shower chair with back;Bedside commode/3-in-1;Straight cane;Walker - rolling (transport chair) Prior Function Level of Independence: Independent Able to Take Stairs?: Yes Driving: Yes Vocation: Full time employment Comments: Works at [employer] job;  All plof info is baseline - prior to surgery early  December Communication Communication: No difficulties Dominant Hand: Right  Cognition  Overall Cognitive Status: Appears within functional limits for tasks assessed/performed Arousal/Alertness: Awake/alert Orientation Level: Oriented X4 / Intact Behavior During Session: [facility] for tasks performed    Extremity/Trunk Assessment Right Upper Extremity Assessment RUE ROM/Strength/Tone: WFL for tasks assessed RUE Sensation: WFL - Light Touch Left Upper Extremity Assessment LUE ROM/Strength/Tone: WFL for tasks assessed LUE Sensation: WFL - Light Touch Right Lower Extremity Assessment RLE ROM/Strength/Tone: Deficits RLE ROM/Strength/Tone Deficits: General weakness - 4-/5 RLE Sensation: WFL - Light Touch Left Lower Extremity Assessment LLE ROM/Strength/Tone: Deficits LLE ROM/Strength/Tone Deficits: General weakness - 4-/5 LLE Sensation: WFL - Light Touch   Balance Balance Balance Assessed: Yes Static Sitting Balance Static Sitting - Balance Support: No upper extremity supported;Feet supported Static Sitting - Level of Assistance: 7: Independent Static Sitting - Comment/# of Minutes: Patient able to sit EOB x 4 minutes with good balance and upright posture.  End of Session PT - End of Session Equipment Utilized During Treatment: Gait belt Activity Tolerance: Patient limited by fatigue (Dyspnea 2/4; O2 sat 97% with gait off O2) Patient left: in bed;with call bell/phone within reach;with family/visitor present Nurse Communication: Mobility status (O2 sat remained 97% with ambulation off O2)  GP     Vena Austria 11/29/2012, 4:52 PM Durenda Hurt. Renaldo Fiddler, [facility] Acute Rehab Services Pager [phone]

## 2012-11-29 NOTE — Progress Notes (Signed)
Pt.c/o pain on her fingers & ankles & noted sl.red.MD on call made aware & ordered to give pain med.for now & will let the MD in am.

## 2012-11-29 NOTE — Progress Notes (Signed)
[facility]  Patient Status: Active (receiving services up to time of hospitalization)  [facility] is providing the following services: RN  If patient discharges after hours, please call [phone].   [name] [name] 11/29/2012, 11:06 AM

## 2012-11-29 NOTE — Progress Notes (Signed)
Pt reports significant improvement in odynophagia.    No need to pursue GI workup any further since she appears to be responding to antifungal therapy and PPIs.  Signing off.

## 2012-11-30 ENCOUNTER — Inpatient Hospital Stay (HOSPITAL_COMMUNITY): Payer: 59

## 2012-11-30 DIAGNOSIS — R509 Fever, unspecified: Secondary | ICD-10-CM

## 2012-11-30 DIAGNOSIS — R079 Chest pain, unspecified: Secondary | ICD-10-CM

## 2012-11-30 LAB — CBC
MCV: 94.9 fL (ref 78.0–100.0)
Platelets: 446 10*3/uL — ABNORMAL HIGH (ref 150–400)
RBC: 3.14 MIL/uL — ABNORMAL LOW (ref 3.87–5.11)
WBC: 7.4 10*3/uL (ref 4.0–10.5)

## 2012-11-30 LAB — BASIC METABOLIC PANEL
CO2: 24 mEq/L (ref 19–32)
Calcium: 8.6 mg/dL (ref 8.4–10.5)
GFR calc non Af Amer: >90 mL/min (ref >90)
Sodium: 138 mEq/L (ref 135–145)

## 2012-11-30 MED ORDER — POTASSIUM CHLORIDE CRYS ER 20 MEQ PO TBCR
60.0000 meq | EXTENDED_RELEASE_TABLET | Freq: Once | ORAL | Status: AC
  Administered 2012-11-30: 60 meq via ORAL
  Filled 2012-11-30: qty 3

## 2012-11-30 MED ORDER — VANCOMYCIN HCL IN DEXTROSE 1-5 GM/200ML-% IV SOLN
1000.0000 mg | Freq: Three times a day (TID) | INTRAVENOUS | Status: DC
  Administered 2012-11-30 – 2012-12-01 (×4): 1000 mg via INTRAVENOUS
  Filled 2012-11-30 (×6): qty 200

## 2012-11-30 NOTE — Progress Notes (Signed)
PT Cancellation Note  Patient Details Name: [name] [name] MRN: [id number] DOB: 12/09/1955   Cancelled Treatment:    Reason Eval/Treat Not Completed: Medical issues which prohibited therapy;Fatigue/lethargy limiting ability to participate.  Patient with fever and feeling poorly.  Patient declined PT today.  Will attempt PT in am.   Vena Austria 11/30/2012, 4:47 PM [phone]

## 2012-11-30 NOTE — Progress Notes (Addendum)
PATIENT DETAILS Name: [name] [name] Age: 56 y.o. Sex: female Date of Birth: 14-Mar-1956 Admit Date: 11/26/2012 Admitting Physician Standley Brooking, MD ZOX:[id number] Drue Novel, MD  Subjective: Right sided Chest pain  better. Odynophagia almost resolved. Claims b/l hand/fingers better as well-almost resolved  Assessment/Plan: Principal Problem:  *Pleuritic chest pain  - CTA of the chest negative for pulmonary embolism - CT of the chest showing a lot of postoperative changes, but also showing a soft tissue density in the posterior mediastinum. Seen by Dr Morton Peters from cardiothoracic surgery,does not think soft tissue density is of any significance -Per Dr Hebert Soho is likely 2/2 post thoractomy pain-suggested Ultram and Neurontin. This is clearly improving  Active Problems: Fever with Leukocytosis -?etiology -low grade fever overnight -Leukocytosis has resolved -appreciate ID input -antibiotics per ID-on  Primaxin, in addition to triple antibiotic regimen for MAC. 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 Luciana Axe- who presumed that this is a MAC infection (rather than mycobacterium TB- as PPD and Quantiferon were negative)- patient as a result was started on rifampin, ethambutol and Biaxin.  Esophagitis - No oral thrush-but at risk for candidal esophagitis - On Empiric Micafungin - Change PPI to twice a day -appreciate GI input-odynophagia better-tolerating dys 3 diet with no difficulty  ? UTI -.Doubt she has UTI - Urine cultures negative-on Primaxin anyway  Diarrhea - Stool C. 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) - On triple antibiotic regimen-Biaxin, rifampin and ethambutol -awaiting definite cultures   Anemia - Suspect this is chronic-from chronic disease - Monitor H&H periodically - Anemia panel if hemoglobin drops further-but is stable  Hypokalemia -replete and recheck in am  GERD - Continue with PPI- but change to twice a day given esophageal thickening seen on the CT  Disposition: Remain inpatient  DVT Prophylaxis: Prophylactic Lovenox   Code Status: Full code  Procedures:  None  CONSULTS:  ID  PHYSICAL EXAM: Vital signs in last 24 hours: Filed Vitals:   11/29/12 0456 11/29/12 1455 11/29/12 2100 11/30/12 0500  BP: 93/59 98/56 110/64 108/68  Pulse: 83 90 89 88  Temp: 98.5 F (36.9 C) 98.9 F (37.2 C) 100.6 F (38.1 C) 99.1 F (37.3 C)  TempSrc: Oral Oral Oral Oral  Resp: 20 18 20 20   Height:      Weight:      SpO2: 99% 93% 98% 96%    Weight change:  Body mass index is 18.08 kg/(m^2).   Gen Exam: Awake and alert with clear speech.   Neck: Supple, No JVD.   Chest: B/L Clear.   CVS: S1 S2 Regular, no murmurs.  Abdomen: soft, BS +, non tender, non distended.  Extremities: no edema, lower extremities warm to touch.No swelling or redness noted in any of her fingers-now able to make a fist in both hands Neurologic: Non Focal.   Skin: No Rash.   Wounds: N/A.    Intake/Output from previous day:  Intake/Output Summary (Last 24 hours) at 11/30/12 1205 Last data filed at 11/30/12 0656  Gross per 24 hour  Intake 1318.17 ml  Output      0 ml  Net 1318.17  ml     LAB RESULTS: CBC  Lab 11/30/12 0650 11/29/12 0715 11/28/12 0644 11/27/12 0556 11/26/12 1631  WBC 7.4 10.1 16.7* 9.6 14.6*  HGB 9.9* 9.2* 9.7* 9.5* 9.7*  HCT 29.8* 28.2* 29.3* 29.6* 29.3*  PLT 446* 380 423* 473* 505*  MCV 94.9 95.6 94.8 96.7 95.8  MCH 31.5 31.2 31.4 31.0 31.7  MCHC 33.2 32.6 33.1 32.1 33.1  RDW 13.8 13.7 13.5 13.6 13.5  LYMPHSABS -- -- -- -- 0.6*    MONOABS -- -- -- -- 1.0  EOSABS -- -- -- -- 0.0  BASOSABS -- -- -- -- 0.0  BANDABS -- -- -- -- --    Chemistries   Lab 11/30/12 0650 11/29/12 0715 11/28/12 0644 11/27/12 0556 11/26/12 1631  NA 138 140 135 135 132*  K 3.1* 3.0* 3.0* 3.4* 3.8  CL 102 103 97 99 97  CO2 24 25 25 24 20   GLUCOSE 102* 119* 118* 114* 117*  BUN 4* 4* 4* 4* 4*  CREATININE 0.43* 0.42* 0.45* 0.45* 0.41*  CALCIUM 8.6 8.4 8.8 8.8 8.7  MG -- -- -- -- --    CBG: No results found for this basename: GLUCAP:5 in the last 168 hours  GFR Estimated Creatinine Clearance: 64.5 ml/min (by C-G formula based on Cr of 0.43).  Coagulation profile No results found for this basename: INR:5,PROTIME:5 in the last 168 hours  Cardiac Enzymes  Lab 11/27/12 1200  CKMB --  TROPONINI <0.30  MYOGLOBIN --    No components found with this basename: POCBNP:3 No results found for this basename: DDIMER:2 in the last 72 hours No results found for this basename: HGBA1C:2 in the last 72 hours No results found for this basename: CHOL:2,HDL:2,LDLCALC:2,TRIG:2,CHOLHDL:2,LDLDIRECT:2 in the last 72 hours No results found for this basename: TSH,T4TOTAL,FREET3,T3FREE,THYROIDAB in the last 72 hours No results found for this basename: VITAMINB12:2,FOLATE:2,FERRITIN:2,TIBC:2,IRON:2,RETICCTPCT:2 in the last 72 hours No results found for this basename: LIPASE:2,AMYLASE:2 in the last 72 hours  Urine Studies No results found for this basename: UACOL:2,UAPR:2,USPG:2,UPH:2,UTP:2,UGL:2,UKET:2,UBIL:2,UHGB:2,UNIT:2,UROB:2,ULEU:2,UEPI:2,UWBC:2,URBC:2,UBAC:2,CAST:2,CRYS:2,UCOM:2,BILUA:2 in the last 72 hours  MICROBIOLOGY: Recent Results (from the past 240 hour(s))  URINE CULTURE     Status: Normal   Collection Time   11/26/12  6:13 PM      Component Value Range Status Comment   Specimen Description URINE, CLEAN CATCH   Final    Special Requests NONE   Final    Culture  Setup Time 11/26/2012 20:02   Final    Colony Count NO GROWTH   Final     Culture NO GROWTH   Final    Report Status 11/27/2012 FINAL   Final   CLOSTRIDIUM DIFFICILE BY PCR     Status: Normal   Collection Time   11/26/12 10:32 PM      Component Value Range Status Comment   C difficile by pcr NEGATIVE  NEGATIVE Final   CULTURE, EXPECTORATED SPUTUM-ASSESSMENT     Status: Normal   Collection Time   11/27/12 11:47 PM      Component Value Range Status Comment   Specimen Description SPUTUM   Final    Special Requests NONE   Final    Sputum evaluation     Final    Value: MICROSCOPIC FINDINGS SUGGEST THAT THIS SPECIMEN IS NOT REPRESENTATIVE OF LOWER RESPIRATORY SECRETIONS. PLEASE RECOLLECT.     CALLED TO RN [facility] AT 0148 11/28/12 LGP   Report Status 11/28/2012 FINAL   Final     RADIOLOGY STUDIES/RESULTS: Dg Chest 2 View  11/26/2012  *RADIOLOGY REPORT*  Clinical Data: Fever  CHEST - 2 VIEW  Comparison: 09/2012  Findings: Interstitial prominence.  Bronchitic changes.  Increased AP diameter of the chest.  Mild cardiomegaly.  Normal pulmonary vascularity.  Loculated right apical hydropneumothorax has improved.  Heterogeneous opacities throughout the right mid and lower lung zones have improved.  Minimal residual subsegmental atelectasis at the left base.  IMPRESSION: Improved loculated right hydropneumothorax.  Improved heterogeneous pulmonary opacities in the right mid and lower lung zones.   Original Report Authenticated By: Jolaine Click, M.D.    Dg Chest 2 View  11/16/2012  *RADIOLOGY REPORT*  Clinical Data: Pneumonia  CHEST - 2 VIEW  Comparison: Yesterday  Findings: Right chest tube has been removed.  Stable right hydropneumothorax.  Opacities within the right lower lung zone and right suprahilar density are stable.  Interstitial prominence throughout the left lung with hyperaeration is stable.  No left pneumothorax.  Normal heart size.  IMPRESSION: Stable right hydropneumothorax after chest tube removal.   Original Report Authenticated By: Jolaine Click, M.D.    Dg  Chest 2 View  11/15/2012  *RADIOLOGY REPORT*  Clinical Data: Shortness of breath, follow up pneumonia  CHEST - 2 VIEW  Comparison: Portable chest x-ray of 11/14/2012 and 11/13/2012  Findings: Aeration has improved slightly.  There is still right apical pneumothorax present with right chest tube unchanged in position.  Right perihilar opacity remains although opacity noted previously at the right lung base has improved.  The heart is within normal limits in size.  IMPRESSION:  1.  Stable right apical pneumothorax with right chest tube remaining. 2.  Some improvement in opacity at the right lung base with haziness overlying the right hilum remaining.   Original Report Authenticated By: Dwyane Dee, M.D.    Dg Chest 2 View  11/02/2012  *RADIOLOGY REPORT*  Clinical Data: Preop.  CHEST - 2 VIEW  Comparison: 08/12/2012  Findings: Right apical pleural/parenchymal density and cavitation again noted, which may be slightly progressed since prior study. Left apical scarring noted.  Heart is normal size.  No effusions. No acute bony abnormality.  IMPRESSION: Right apical density may have progressed slightly since prior study.   Original Report Authenticated By: Charlett Nose, M.D.    Ct Chest W Contrast  11/15/2012  *RADIOLOGY REPORT*  Clinical Data: Fever and right chest pain.  Status post right lobectomy.  Smoker.  CT CHEST WITH CONTRAST  Technique:  Multidetector CT imaging of the chest was performed following the standard protocol during bolus administration of intravenous contrast.  Contrast: 80mL OMNIPAQUE IOHEXOL 300 MG/ML  SOLN  Comparison: Chestradiographs obtained earlier today and chest CT dated 08/15/2012.  Findings: Interval resection of the cavitary mass in the right upper lobe with multiple surgical staple lines and surgical clips. Approximately 20% right hydropneumothorax.  A portion of this is located at the apex and another portion is posteromedially located and appears partially loculated.  No fluid  collections with peripheral rim enhancement are seen.  There is minimal atelectasis at both lung bases.  Bullous changes in the left lung are again demonstrated, remaining most pronounced at the medial aspect of the left upper lobe.  No lung masses or enlarged lymph nodes are seen.  A 1 cm cyst in the right lobe of the liver is unchanged.  Mild thoracic spine degenerative changes.  IMPRESSION:  1.  Right postoperative changes with an approximately 20% right hydropneumothorax. 2.  No abscess or evidence of lung empyema. 3.  Minimal bibasilar  atelectasis. 4.  Stable changes of COPD in the left lung.   Original Report Authenticated By: Beckie Salts, M.D.    Ct Angio Chest Pe W/cm &/or Wo Cm  11/27/2012  *RADIOLOGY REPORT*  Clinical Data: Pleuritic chest pain, worsening shortness of breath and fever.  History of right upper lobectomy and MAC.  CT ANGIOGRAPHY CHEST  Technique:  Multidetector CT imaging of the chest using the standard protocol during bolus administration of intravenous contrast. Multiplanar reconstructed images including MIPs were obtained and reviewed to evaluate the vascular anatomy.  Contrast: 80mL OMNIPAQUE IOHEXOL 350 MG/ML SOLN  Comparison: CT of the chest performed 11/15/2012  Findings: There is no evidence of significant pulmonary embolus. Evaluation for pulmonary embolus is mildly suboptimal due to motion artifact.  There is a persistent small right-sided hydropneumothorax, most prominent at the right lung apex; this is grossly unchanged in appearance from the prior study.  Extensive postoperative change is noted along the right side of the mediastinum, with associated apparent dense sutures and underlying atelectasis.  Significant associated soft tissue density is of uncertain significance, tracking inferiorly along the posterior mediastinum.  The esophagus is obscured due to surrounding soft tissue attenuation.  The degree of soft tissue inflammation about the esophagus appears significantly  worsened from the prior study; this could conceivably reflect diffuse esophagitis.  Previously noted dense airspace opacification within the inferior right middle lobe has improved.  There is a persistent somewhat loculated small right-sided pleural effusion; previously noted air components within the effusion are less prominent.  Scattered blebs are seen at the left lung apex, and underlying emphysema is noted throughout both lungs, more prominent at the lung apices.  The mediastinum is otherwise grossly unremarkable, though the significance of diffuse soft tissue attenuation along the right side of the mediastinum and posterior mediastinum is uncertain.  No pericardial effusion is identified.  The great vessels are grossly unremarkable in appearance.  No definite mediastinal lymphadenopathy is seen.  No axillary lymphadenopathy is seen.  The thyroid gland is unremarkable in appearance.  The visualized portions of the liver and spleen are unremarkable.  No acute osseous abnormalities are seen.  Chronic right-sided rib deformity is noted.  IMPRESSION:  1.  No evidence of significant pulmonary embolus. 2.  Extensive postoperative change again noted along the right side of the mediastinum, with associated soft tissue density tracking inferiorly along the posterior mediastinum, of uncertain significance.  The esophagus is obscured due to surrounding soft tissue attenuation.  Would correlate for history of malignancy; this could simply reflect postoperative change.  The degree of soft tissue inflammation about the esophagus appears significantly worsened from the recent prior study.  This could conceivably reflect diffuse esophagitis.  3.  Persistent small right-sided hydropneumothorax, most prominent at the right lung apex.  Loculated components noted along the right lung. 4.  Previously noted dense consolidation within the inferior right middle lung lobe has improved. 5.  Scattered underlying emphysema noted, with  blebs seen at the left lung apex. 6.  Chronic right-sided rib deformity again noted.   Original Report Authenticated By: Tonia Ghent, M.D.    Dg Chest Port 1 View  11/14/2012  *RADIOLOGY REPORT*  Clinical Data: Right lower lobe pneumonia.  Status post VATS.  PORTABLE CHEST - 1 VIEW  Comparison: 11/13/2012  Findings: 0616 hours right chest tube remains in place.  The apical component of the pneumothorax is still visible and not substantially changed in the interval.  Some mild bibasilar atelectasis or infiltrate persist.  No overt pulmonary edema. Heart size is stable. Telemetry leads overlie the chest.  IMPRESSION: Stable exam.   Original Report Authenticated By: Kennith Center, M.D.    Dg Chest Port 1 View  11/13/2012  *RADIOLOGY REPORT*  Clinical Data: Right-sided chest tube  PORTABLE CHEST - 1 VIEW  Comparison: 11/12/2012; 11/07/2012; 08/12/2012; chest CT - 08/15/2012  Findings:  Grossly unchanged cardiac silhouette and mediastinal contours given reduced lung volumes of patient rotation.  Interval removal of right jugular approach central venous catheter and the more inferior and laterally positioned right-sided chest tube.  The apically directed right-sided chest tube is unchanged in position. Grossly unchanged small right apical pneumothorax.  Heterogeneous air space opacities within the remaining right lung are grossly unchanged.  Grossly unchanged left basilar heterogeneous opacities. No new focal airspace opacity.  Query trace right-sided effusion, likely unchanged.  Unchanged bones.  IMPRESSION: 1.  Interval removal of support apparatus with unchanged positioning of remaining apically directed right-sided chest tube. Grossly unchanged small residual right-sided hydropneumothorax.  2.  Grossly unchanged heterogeneous air space opacities within the remaining right lung   Original Report Authenticated By: Tacey Ruiz, MD    Dg Chest Port 1 View  11/12/2012  *RADIOLOGY REPORT*  Clinical Data:  Follow-up lobectomy.  PORTABLE CHEST - 1 VIEW  Comparison: 11/11/2012  Findings: Upper and lower right-sided chest tubes remain  in good position.  Approximately 10% right apical pneumothorax unchanged. Slight left basilar opacity may be minimally improved.  Central venous catheter tip unchanged at cavoatrial junction. Cardiomegaly.  Moderate airspace disease right lung stable, with right hemithorax volume loss.  IMPRESSION: Stable right apical pneumothorax (10%).  Slight improvement left basilar aeration.   Original Report Authenticated By: Davonna Belling, M.D.    Dg Chest Port 1 View  11/11/2012  *RADIOLOGY REPORT*  Clinical Data: 56 year old female - postoperative right lobectomy for cavitary lesion.  PORTABLE CHEST - 1 VIEW  Comparison: 11/10/2012 and prior chest radiographs dating back to 2009.  Findings: Right thoracic postoperative changes are again identified. Two separate right thoracostomy tubes and a right IJ central venous catheter with tip overlying the cavoatrial junction again noted. Right hemithorax volume loss is present with airspace opacities within the remaining right lung, slightly improved since the prior study. A small (10%) right apical pneumothorax is unchanged. Mild left basilar opacity is again noted question atelectasis versus developing airspace disease.  IMPRESSION: Improved right lung aeration with continued small right apical pneumothorax (10%).  Mild left basilar opacity again noted which may represent atelectasis or developing airspace disease/pneumonia.  Stable support apparatus as described.   Original Report Authenticated By: Harmon Pier, M.D.    Dg Chest Port 1 View  11/10/2012  *RADIOLOGY REPORT*  Clinical Data: Evaluate right chest tubes.  PORTABLE CHEST - 1 VIEW  Comparison: 11/09/2012  Findings: Stable position of the right chest drains.  There is stable right apical pneumothorax that roughly measures 15%. Persistent parenchymal densities throughout the right lung.  Left  lung is clear with exception of left basilar atelectasis.  Heart and mediastinum are stable.  The jugular central venous catheter is in the lower SVC region.  IMPRESSION: No significant change in the size of the right pneumothorax. Stable patchy densities throughout the right lung.  New patchy densities at the left lung may represent atelectasis.   Original Report Authenticated By: Richarda Overlie, M.D.    Dg Chest Port 1 View  11/09/2012  *RADIOLOGY REPORT*  Clinical Data: Chest tubes.  Pneumothorax.  PORTABLE  CHEST - 1 VIEW  Comparison: Chest 11/08/2012.  Findings: Two right chest tubes and right IJ catheter remain in place.  Small right apical pneumothorax is unchanged in appearance. There has been increase in right pleural effusion.  Airspace disease throughout the right chest appears increased.  Volume loss on the right with shift of the mediastinal contents again from left to right is again seen.  Left lung is clear.  Cardiac silhouette is partially obscured.  IMPRESSION:  1.  No change in a small right pneumothorax with a chest tube in place. 2.  Increased right effusion and airspace disease.   Original Report Authenticated By: Holley Dexter, M.D.    Dg Chest Port 1 View  11/08/2012  *RADIOLOGY REPORT*  Clinical Data: Post thoracotomy and lobectomy, evaluate pneumothorax  PORTABLE CHEST - 1 VIEW  Comparison: 11/07/2012; 11/06/2012; 11/05/2012  Findings:  Grossly unchanged cardiac silhouette and mediastinal contours with persistent deviation of the cardiomediastinal structures into the right hemithorax.  Stable position of support apparatus.  No change to minimal decrease in small to moderate-sized right-sided hydropneumothorax.  Worsening heterogeneous opacities in the remaining right lung.  The left hemithorax is grossly unchanged with minimal left basilar opacities favored to represent atelectasis.  Unchanged bones.  IMPRESSION: 1.  Stable positioning of support apparatus.  No change to minimal decrease  in small to moderate-sized right-sided hydropneumothorax. 2.  Worsening heterogeneous opacities within the remaining right lung with differential considerations including atelectasis, edema or developing infection.  Continued attention on follow-up is recommended.   Original Report Authenticated By: Tacey Ruiz, MD    Dg Chest Port 1 View  11/07/2012  *RADIOLOGY REPORT*  Clinical Data: Status post right lobectomy.  PORTABLE CHEST - 1 VIEW  Comparison: One-view chest 11/06/2012.  Findings: The heart size is normal.  The right apical pneumothorax has increased in size.  Lung density has increased on the right as well.  The chest tubes are stable.  Minimal atelectasis is present at the left base.  IMPRESSION:  1.  Increasing right apical pneumothorax. 2.  Increasing consolidation of the residual right lung. 3.  The support apparatus is stable.   Original Report Authenticated By: Marin Roberts, M.D.    Dg Chest Port 1 View  11/06/2012  *RADIOLOGY REPORT*  Clinical Data: VATS.  PORTABLE CHEST - 1 VIEW  Comparison: 11/05/2012  Findings: Two right chest tubes remain in place.  Postoperative changes in the right lung.  Suspect small residual right apical pneumothorax although the pleural edges difficult to visualize today's study.  Lucency around the superior chest tube may reflect a small residual pneumothorax.  No confluent airspace opacities in the lungs.  Heart is normal size.  Subcutaneous air in the right chest wall, slightly increased.  IMPRESSION: Two right chest tube remain in place.  Suspect tiny residual right apical pneumothorax around the superior chest tube.   Original Report Authenticated By: Charlett Nose, M.D.    Dg Chest Portable 1 View  11/05/2012  *RADIOLOGY REPORT*  Clinical Data: Postop.  Status post VATS.  PORTABLE CHEST - 1 VIEW  Comparison: 11/02/2012  Findings: Lungs are hyperinflated.  Right-sided IJ central line tip overlies the level of the superior vena cava.  Dual right-sided  chest tubes are in place.  There is a small right apical pneumothorax, associated with apical pleural thickening and apical lung sutures.  Perihilar bronchitic changes are again noted.  No evidence for pulmonary edema.  IMPRESSION:  1.  Postoperative appearance of the chest. 2.  Small right apical pneumothorax.   Original Report Authenticated By: Norva Pavlov, M.D.     MEDICATIONS: Scheduled Meds:    . antiseptic oral rinse  15 mL Mouth Rinse BID  . enoxaparin (LOVENOX) injection  40 mg Subcutaneous Q24H  . ethambutol  800 mg Oral Daily  . feeding supplement  1 Container Oral TID BM  . gabapentin  200 mg Oral BID  . imipenem-cilastatin  250 mg Intravenous Q6H  . isoniazid  300 mg Oral Daily  . micafungin [facility]) IV  150 mg Intravenous Daily  . pantoprazole (PROTONIX) IV  40 mg Intravenous Q12H  . vitamin B-6  50 mg Oral Daily  . rifampin  600 mg Oral Daily  . sodium chloride  3 mL Intravenous Q12H  . sucralfate  1 g Oral TID WC & HS   Continuous Infusions:    . sodium chloride 20 mL/hr (11/30/12 0656)   PRN Meds:.acetaminophen, acetaminophen, alum & mag hydroxide-simeth, morphine injection, nicotine polacrilex, ondansetron (ZOFRAN) IV, ondansetron, oxyCODONE, traMADol  Antibiotics: Anti-infectives     Start     Dose/Rate Route Frequency Ordered Stop   11/27/12 1730   isoniazid (NYDRAZID) tablet 300 mg        300 mg Oral Daily 11/27/12 1723     11/27/12 1600   vancomycin (VANCOCIN) IVPB 1000 mg/200 mL premix  Status:  Discontinued        1,000 mg 200 mL/hr over 60 Minutes Intravenous Every 8 hours 11/27/12 1333 11/29/12 1510   11/27/12 1400   micafungin (MYCAMINE) 150 mg in sodium chloride 0.9 % 100 mL IVPB        150 mg 100 mL/hr over 1 Hours Intravenous Daily 11/27/12 1251     11/27/12 1400   azithromycin (ZITHROMAX) tablet 500 mg  Status:  Discontinued        500 mg Oral Daily 11/27/12 1252 11/27/12 1724   11/27/12 1400   imipenem-cilastatin (PRIMAXIN) 250 mg in  sodium chloride 0.9 % 100 mL IVPB        250 mg 200 mL/hr over 30 Minutes Intravenous 4 times per day 11/27/12 1333     11/27/12 1000   fluconazole (DIFLUCAN) tablet 100 mg  Status:  Discontinued        100 mg Oral Daily 11/26/12 2048 11/27/12 1248   11/27/12 1000   rifampin (RIFADIN) capsule 600 mg        600 mg Oral Daily 11/26/12 2048     11/27/12 0700   vancomycin (VANCOCIN) IVPB 1000 mg/200 mL premix  Status:  Discontinued        1,000 mg 200 mL/hr over 60 Minutes Intravenous Every 8 hours 11/26/12 2042 11/27/12 1248   11/26/12 2200   clarithromycin (BIAXIN) tablet 1,000 mg  Status:  Discontinued        1,000 mg Oral Daily with supper 11/26/12 2048 11/27/12 1248   11/26/12 2200   ethambutol (MYAMBUTOL) tablet 800 mg        800 mg Oral Daily 11/26/12 2048     11/26/12 2200   ceFEPIme (MAXIPIME) 1 g in dextrose 5 % 50 mL IVPB  Status:  Discontinued        1 g 100 mL/hr over 30 Minutes Intravenous 3 times per day 11/26/12 2042 11/27/12 1248   11/26/12 2030   vancomycin (VANCOCIN) IVPB 1000 mg/200 mL premix        1,000 mg 200 mL/hr over 60 Minutes Intravenous  Once 11/26/12 2028 11/27/12 0029  11/26/12 1830   cefTRIAXone (ROCEPHIN) 1 g in dextrose 5 % 50 mL IVPB  Status:  Discontinued        1 g 100 mL/hr over 30 Minutes Intravenous Every 24 hours 11/26/12 1816 11/26/12 2048           Jeoffrey Massed, MD  [facility] Pager:[phone]  If 7PM-7AM, please contact night-coverage [email or web address] Password [email or web address] 11/30/2012, 12:05 PM   LOS: 4 days

## 2012-11-30 NOTE — Progress Notes (Signed)
INITIAL NUTRITION ASSESSMENT  DOCUMENTATION CODES Per approved criteria  -Not Applicable   INTERVENTION: 1. Continue Resource Breeze TID 2. RD will continue to follow    NUTRITION DIAGNOSIS: Underweight related to early satiety as evidenced by Body mass index is 18.08 kg/(m^2). Marland Kitchen   Goal: PO intake to meet >/=90% estimated nutrition needs  Monitor:  PO intake, weight trends, labs, I/O's  Reason for Assessment: Hx of weight loss, low BMI   56 y.o. female  Admitting Dx: Pleuritic chest pain  ASSESSMENT: Pt with hx of lung mass, partial lobectomy earlier this year.  Pt reports hx of weight loss, ongoing for about 1 year. Early satiety. 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  Weight loss of 15% in one year, not significant. Weight in the past 5 months has been stable around 112-115 lbs.  Encouraged small frequent meals and Resource Breeze BID at home.   Height: Ht Readings from Last 1 Encounters:  11/26/12 5\' 6"  (1.676 m)    Weight: Wt Readings from Last 1 Encounters:  11/26/12 111 lb 15.9 oz (50.8 kg)    Ideal Body Weight: 59 kg   % Ideal Body Weight: 86%  Wt Readings from Last 10 Encounters:  11/26/12 111 lb 15.9 oz (50.8 kg)  11/22/12 110 lb (49.896 kg)  11/12/12 115 lb 4.8 oz (52.3 kg)  11/12/12 115 lb 4.8 oz (52.3 kg)  11/02/12 113 lb 9 oz (51.512 kg)  10/16/12 111 lb (50.349 kg)  09/24/12 113 lb 3.2 oz (51.347 kg)  08/15/12 112 lb 9.6 oz (51.075 kg)  08/10/12 112 lb (50.803 kg)  06/13/12 113 lb (51.256 kg)    Usual Body Weight: 130 lbs about 1 year ago   % Usual Body Weight: 85%  BMI:  Body mass index is 18.08 kg/(m^2). Underweight   Estimated Nutritional Needs: Kcal: 1450-1650 Protein: 50-60 gm  Fluid: 1.5-1.7 L/day   Skin: incisions from recent surgery   Diet Order: Dysphagia 3, thin  EDUCATION NEEDS: -No education needs identified at this time   Intake/Output Summary (Last 24 hours) at 11/30/12 1001 Last data filed at 11/30/12 0656  Gross  per 24 hour  Intake 1318.17 ml  Output      0 ml  Net 1318.17 ml    Last BM:  12/26  Labs:   Lab 11/30/12 0650 11/29/12 0715 11/28/12 0644  NA 138 140 135  K 3.1* 3.0* 3.0*  CL 102 103 97  CO2 24 25 25   BUN 4* 4* 4*  CREATININE 0.43* 0.42* 0.45*  CALCIUM 8.6 8.4 8.8  MG -- -- --  PHOS -- -- --  GLUCOSE 102* 119* 118*    CBG (last 3)  No results found for this basename: GLUCAP:3 in the last 72 hours  Scheduled Meds:   . antiseptic oral rinse  15 mL Mouth Rinse BID  . enoxaparin (LOVENOX) injection  40 mg Subcutaneous Q24H  . ethambutol  800 mg Oral Daily  . feeding supplement  1 Container Oral TID BM  . gabapentin  200 mg Oral BID  . imipenem-cilastatin  250 mg Intravenous Q6H  . isoniazid  300 mg Oral Daily  . micafungin [facility]) IV  150 mg Intravenous Daily  . pantoprazole (PROTONIX) IV  40 mg Intravenous Q12H  . vitamin B-6  50 mg Oral Daily  . rifampin  600 mg Oral Daily  . saccharomyces boulardii  250 mg Oral BID  . sodium chloride  3 mL Intravenous Q12H  . sucralfate  1 g  Oral TID WC & HS    Continuous Infusions:   . sodium chloride 20 mL/hr (11/30/12 1610)    Past Medical History  Diagnosis Date  . COPD (chronic obstructive pulmonary disease)     per CT 11/10/05, will minimal bronchiectasis rll  . GERD (gastroesophageal reflux disease)   . Osteoporosis     dexa per gyn  . 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 Headache   . Lichen planus   . B12 deficiency   . Endometriosis   . Cataracts, bilateral   . Arthritis   . Lung mass   . Asthma     .  years ago- has not had problem for years.  . Pneumonia     last ime 2008  . Hemorrhoid   . Pneumothorax, spontaneous, tension     x 2 in her 20's.  has a chest tube for one    Past Surgical History  Procedure Date  . Knee surgery     .not replacement .  Marland Kitchen Ovarian cyst removed   . Video bronchoscopy 02/15/2012    Procedure: VIDEO BRONCHOSCOPY WITH FLUORO;  Surgeon: Nyoka Cowden, MD;   Location: [facility];  Service: Endoscopy;  Laterality: Bilateral;  WASHING- INTERVENTION BIOPSIES INTERVENTION BRONCHOSCOPY WITH VIDEO  . Mediastinoscopy 05/25/2012    Procedure: MEDIASTINOSCOPY;  Surgeon: Alleen Borne, MD;  Location: [facility] OR;  Service: Thoracic;  Laterality: N/A;  . Tonsillectomy   . Flexible bronchoscopy 11/05/2012    Procedure: FLEXIBLE BRONCHOSCOPY;  Surgeon: Alleen Borne, MD;  Location: [facility] OR;  Service: Thoracic;  Laterality: N/A;  . Thoracotomy 11/05/2012    Procedure: THORACOTOMY MAJOR;  Surgeon: Alleen Borne, MD;  Location: [facility] OR;  Service: Thoracic;  Laterality: Right;  . Lobectomy 11/05/2012    Procedure: LOBECTOMY;  Surgeon: Alleen Borne, MD;  Location: [facility] OR;  Service: Thoracic;  Laterality: Right;  right upper lobectomy    Clarene Duke RD, LDN Pager [phone] After Hours pager [phone]

## 2012-11-30 NOTE — Progress Notes (Addendum)
ANTIBIOTIC CONSULT NOTE - FOLLOW UP  Pharmacy Consult for Primaxin Indication: Empiric coverage in setting of chest pain/fevers  Allergies  Allergen Reactions  . Alendronate Sodium Other (See Comments)    REACTION: chest pain  . Antihistamines, Chlorpheniramine-Type Other (See Comments)    REACTION: "makes lungs bleed"  . Benadryl (Diphenhydramine) Other (See Comments)    REACTION: "makes lungs bleed"  . Nsaids Other (See Comments)    REACTION: ischemic colitis  . Other Other (See Comments)    All anti-inflammatories.  REACTION: ischemic colitis  . Pseudoephedrine Other (See Comments)    REACTION: Ischemic colitis  . Robitussin A-C (Guaifenesin-Codeine) Itching and Other (See Comments)    Palms itching  . Levofloxacin Anxiety    Patient Measurements: Height: 5\' 6"  (167.6 cm) Weight: 111 lb 15.9 oz (50.8 kg) IBW/kg (Calculated) : 59.3   Vital Signs: Temp: 99.1 F (37.3 C) (12/27 0500) Temp src: Oral (12/27 0500) BP: 108/68 mmHg (12/27 0500) Pulse Rate: 88  (12/27 0500) Intake/Output from previous day: 12/26 0701 - 12/27 0700 In: 1554.2 [P.O.:816; I.V.:738.2] Out: -  Intake/Output from this shift:    Labs:  Basename 11/30/12 0650 11/29/12 0715 11/28/12 0644  WBC 7.4 10.1 16.7*  HGB 9.9* 9.2* 9.7*  PLT 446* 380 423*  LABCREA -- -- --  CREATININE 0.43* 0.42* 0.45*   Estimated Creatinine Clearance: 64.5 ml/min (by C-G formula based on Cr of 0.43). No results found for this basename: VANCOTROUGH:2,VANCOPEAK:2,VANCORANDOM:2,GENTTROUGH:2,GENTPEAK:2,GENTRANDOM:2,TOBRATROUGH:2,TOBRAPEAK:2,TOBRARND:2,AMIKACINPEAK:2,AMIKACINTROU:2,AMIKACIN:2, in the last 72 hours    Assessment: [name] [name] is a 56 year old female on day 5 IV antibiotics and currently on primaxin (day #4) per pharmacy and micafungin for empiric coverage in the setting of fevers s/p recent thoracotomy. She continues on her home non-TB MAC regimen. Renal function is stable. Tmax 100.6 and wbc wnl.   Plan:    Continue primaxin 250mg  IV q6h F/U LOT  Thank you,  Brett Fairy, PharmD, BCPS 11/30/2012 9:22 AM  Addendum  Vancomycin to be added back on for ?empyema. Will resume 1000mg  IV q8h and will likely order trough tomorrow.   Thank you,  Brett Fairy, PharmD, BCPS 11/30/2012 12:33 PM

## 2012-11-30 NOTE — Progress Notes (Signed)
Patient is currently active with long-term disease management services with [facility] Care Management Program. Patient will receive a post discharge transition of care call and monthly home visits for assessments and for education.  She is active with [facility]. Spoke with Ms [place] at bedside to make aware [facility] Care Management will continue to follow. Offered support and encouragement. Appreciative of visit. Of note, [facility] Care Management does not interfere or replace home health services that are in place. Will continue to follow.    Raiford Noble, MSN- Ed, RN,BSN [facility] Liaison [phone]

## 2012-11-30 NOTE — Care Management Note (Addendum)
    Page 1 of 1   12/04/2012     6:09:34 PM   CARE MANAGEMENT NOTE 12/04/2012  Patient:  ZYAH, GOMM   Account Number:  [id number]  Date Initiated:  11/30/2012  Documentation initiated by:  Letha Cape  Subjective/Objective Assessment:   dx pleuritic chest pain  admit- active with [facility] fro RN and pt     Action/Plan:   Anticipated DC Date:  12/04/2012   Anticipated DC Plan:  HOME/SELF CARE      DC Planning Services  CM consult      [facility] Choice  HOME HEALTH  Resumption Of Svcs/PTA Provider   Choice offered to / List presented to:  C-1 Patient           Status of service:  Completed, signed off Medicare Important Message given?   (If response is "NO", the following Medicare IM given date fields will be blank) Date Medicare IM given:   Date Additional Medicare IM given:    Discharge Disposition:  HOME/SELF CARE  Per UR Regulation:  Reviewed for med. necessity/level of care/duration of stay  If discussed at Long Length of Stay Meetings, dates discussed:    Comments:  12/04/12 17:11 Letha Cape RN, BSN [phone] patient is acitve with [facility] for [facility], HHPT will need to resume this at dc.  patient dc to home today.  Per physical therapy no pt needs needed.

## 2012-11-30 NOTE — Progress Notes (Signed)
INFECTIOUS DISEASE PROGRESS NOTE  ID: [name] [name] is a 56 y.o. female with   Principal Problem:  *Pleuritic chest pain Active Problems:  Fever  MAC (mycobacterium avium-intracellulare complex)  Diarrhea  Anemia  Odynophagia  Subjective: Had nl BM this AM.   Abtx:  Anti-infectives     Start     Dose/Rate Route Frequency Ordered Stop   11/27/12 1730   isoniazid (NYDRAZID) tablet 300 mg        300 mg Oral Daily 11/27/12 1723     11/27/12 1600   vancomycin (VANCOCIN) IVPB 1000 mg/200 mL premix  Status:  Discontinued        1,000 mg 200 mL/hr over 60 Minutes Intravenous Every 8 hours 11/27/12 1333 11/29/12 1510   11/27/12 1400   micafungin (MYCAMINE) 150 mg in sodium chloride 0.9 % 100 mL IVPB        150 mg 100 mL/hr over 1 Hours Intravenous Daily 11/27/12 1251     11/27/12 1400   azithromycin (ZITHROMAX) tablet 500 mg  Status:  Discontinued        500 mg Oral Daily 11/27/12 1252 11/27/12 1724   11/27/12 1400   imipenem-cilastatin (PRIMAXIN) 250 mg in sodium chloride 0.9 % 100 mL IVPB        250 mg 200 mL/hr over 30 Minutes Intravenous 4 times per day 11/27/12 1333     11/27/12 1000   fluconazole (DIFLUCAN) tablet 100 mg  Status:  Discontinued        100 mg Oral Daily 11/26/12 2048 11/27/12 1248   11/27/12 1000   rifampin (RIFADIN) capsule 600 mg        600 mg Oral Daily 11/26/12 2048     11/27/12 0700   vancomycin (VANCOCIN) IVPB 1000 mg/200 mL premix  Status:  Discontinued        1,000 mg 200 mL/hr over 60 Minutes Intravenous Every 8 hours 11/26/12 2042 11/27/12 1248   11/26/12 2200   clarithromycin (BIAXIN) tablet 1,000 mg  Status:  Discontinued        1,000 mg Oral Daily with supper 11/26/12 2048 11/27/12 1248   11/26/12 2200   ethambutol (MYAMBUTOL) tablet 800 mg        800 mg Oral Daily 11/26/12 2048     11/26/12 2200   ceFEPIme (MAXIPIME) 1 g in dextrose 5 % 50 mL IVPB  Status:  Discontinued        1 g 100 mL/hr over 30 Minutes Intravenous 3 times per  day 11/26/12 2042 11/27/12 1248   11/26/12 2030   vancomycin (VANCOCIN) IVPB 1000 mg/200 mL premix        1,000 mg 200 mL/hr over 60 Minutes Intravenous  Once 11/26/12 2028 11/27/12 0029   11/26/12 1830   cefTRIAXone (ROCEPHIN) 1 g in dextrose 5 % 50 mL IVPB  Status:  Discontinued        1 g 100 mL/hr over 30 Minutes Intravenous Every 24 hours 11/26/12 1816 11/26/12 2048          Medications:  Scheduled:   . antiseptic oral rinse  15 mL Mouth Rinse BID  . enoxaparin (LOVENOX) injection  40 mg Subcutaneous Q24H  . ethambutol  800 mg Oral Daily  . feeding supplement  1 Container Oral TID BM  . gabapentin  200 mg Oral BID  . imipenem-cilastatin  250 mg Intravenous Q6H  . isoniazid  300 mg Oral Daily  . micafungin [facility]) IV  150 mg Intravenous Daily  .  pantoprazole (PROTONIX) IV  40 mg Intravenous Q12H  . vitamin B-6  50 mg Oral Daily  . rifampin  600 mg Oral Daily  . sodium chloride  3 mL Intravenous Q12H  . sucralfate  1 g Oral TID WC & HS    Objective: Vital signs in last 24 hours: Temp:  [98.9 F (37.2 C)-100.6 F (38.1 C)] 99.1 F (37.3 C) (12/27 0500) Pulse Rate:  [88-90] 88  (12/27 0500) Resp:  [18-20] 20  (12/27 0500) BP: (98-110)/(56-68) 108/68 mmHg (12/27 0500) SpO2:  [93 %-98 %] 96 % (12/27 0500)   General appearance: alert, cooperative and no distress Resp: rhonchi bilaterally and mild Chest wall: no tenderness, sugical wound is well healed, no fluctuance. no tenderness. Cardio: regular rate and rhythm GI: normal findings: bowel sounds normal and soft, non-tender  Lab Results  Basename 11/30/12 0650 11/29/12 0715  WBC 7.4 10.1  HGB 9.9* 9.2*  HCT 29.8* 28.2*  NA 138 140  K 3.1* 3.0*  CL 102 103  CO2 24 25  BUN 4* 4*  CREATININE 0.43* 0.42*  GLU -- --   Liver Panel  Basename 11/28/12 0644  PROT 6.4  ALBUMIN 2.0*  AST 10  ALT 8  ALKPHOS 163*  BILITOT 0.4  BILIDIR --  IBILI --   Sedimentation Rate No results found for this  basename: ESRSEDRATE in the last 72 hours C-Reactive Protein No results found for this basename: CRP:2 in the last 72 hours  Microbiology: Recent Results (from the past 240 hour(s))  URINE CULTURE     Status: Normal   Collection Time   11/26/12  6:13 PM      Component Value Range Status Comment   Specimen Description URINE, CLEAN CATCH   Final    Special Requests NONE   Final    Culture  Setup Time 11/26/2012 20:02   Final    Colony Count NO GROWTH   Final    Culture NO GROWTH   Final    Report Status 11/27/2012 FINAL   Final   CLOSTRIDIUM DIFFICILE BY PCR     Status: Normal   Collection Time   11/26/12 10:32 PM      Component Value Range Status Comment   C difficile by pcr NEGATIVE  NEGATIVE Final   CULTURE, EXPECTORATED SPUTUM-ASSESSMENT     Status: Normal   Collection Time   11/27/12 11:47 PM      Component Value Range Status Comment   Specimen Description SPUTUM   Final    Special Requests NONE   Final    Sputum evaluation     Final    Value: MICROSCOPIC FINDINGS SUGGEST THAT THIS SPECIMEN IS NOT REPRESENTATIVE OF LOWER RESPIRATORY SECRETIONS. PLEASE RECOLLECT.     CALLED TO RN [facility] AT 0148 11/28/12 LGP   Report Status 11/28/2012 FINAL   Final     Studies/Results: No results found.   Assessment/Plan: Non-tuberculous Mycobacteria. (NTM)  ? HCAP  Esophagitis, post-operative mediastinal changes  Loculated areas R Lung, R hydropneumothorax   Total days of antibiotics 8 (INH/Rifampin/ETH/Azithro)  Day 4 Imipenem/Mycfungin  Would- cont her therapy for NTM  WBC continues to improve.  F/u CXR  Had low grade temp this AM (100.6). Will restart her vanco, treat her as infected lung space (loculated areas on CT).    Johny Sax Infectious Diseases [phone] 11/30/2012, 11:55 AM   LOS: 4 days

## 2012-12-01 DIAGNOSIS — J479 Bronchiectasis, uncomplicated: Secondary | ICD-10-CM

## 2012-12-01 DIAGNOSIS — N390 Urinary tract infection, site not specified: Secondary | ICD-10-CM

## 2012-12-01 LAB — BASIC METABOLIC PANEL
BUN: 5 mg/dL — ABNORMAL LOW (ref 6–23)
CO2: 25 mEq/L (ref 19–32)
Chloride: 99 mEq/L (ref 96–112)
Creatinine, Ser: 0.52 mg/dL (ref 0.50–1.10)

## 2012-12-01 LAB — CBC
HCT: 30.1 % — ABNORMAL LOW (ref 36.0–46.0)
Hemoglobin: 9.5 g/dL — ABNORMAL LOW (ref 12.0–15.0)
MCV: 95 fL (ref 78.0–100.0)
RBC: 3.17 MIL/uL — ABNORMAL LOW (ref 3.87–5.11)
WBC: 4.4 10*3/uL (ref 4.0–10.5)

## 2012-12-01 MED ORDER — SACCHAROMYCES BOULARDII 250 MG PO CAPS
250.0000 mg | ORAL_CAPSULE | Freq: Two times a day (BID) | ORAL | Status: DC
  Administered 2012-12-01 – 2012-12-04 (×6): 250 mg via ORAL
  Filled 2012-12-01 (×8): qty 1

## 2012-12-01 MED ORDER — VANCOMYCIN HCL IN DEXTROSE 1-5 GM/200ML-% IV SOLN
1000.0000 mg | Freq: Three times a day (TID) | INTRAVENOUS | Status: DC
  Administered 2012-12-01 – 2012-12-03 (×5): 1000 mg via INTRAVENOUS
  Filled 2012-12-01 (×6): qty 200

## 2012-12-01 MED ORDER — VANCOMYCIN HCL 10 G IV SOLR
1250.0000 mg | Freq: Three times a day (TID) | INTRAVENOUS | Status: DC
  Filled 2012-12-01 (×2): qty 1250

## 2012-12-01 NOTE — Progress Notes (Signed)
INFECTIOUS DISEASE PROGRESS NOTE  ID: RAENAH MURLEY is a 56 y.o. female with   Principal Problem:  *Pleuritic chest pain Active Problems:  Fever  MAC (mycobacterium avium-intracellulare complex)  Diarrhea  Anemia  Odynophagia  Subjective: Feels better, off O2. Still some CP.   Abtx:  Anti-infectives     Start     Dose/Rate Route Frequency Ordered Stop   11/30/12 1600   vancomycin (VANCOCIN) IVPB 1000 mg/200 mL premix        1,000 mg 200 mL/hr over 60 Minutes Intravenous Every 8 hours 11/30/12 1233     11/27/12 1730   isoniazid (NYDRAZID) tablet 300 mg        300 mg Oral Daily 11/27/12 1723     11/27/12 1600   vancomycin (VANCOCIN) IVPB 1000 mg/200 mL premix  Status:  Discontinued        1,000 mg 200 mL/hr over 60 Minutes Intravenous Every 8 hours 11/27/12 1333 11/29/12 1510   11/27/12 1400   micafungin (MYCAMINE) 150 mg in sodium chloride 0.9 % 100 mL IVPB        150 mg 100 mL/hr over 1 Hours Intravenous Daily 11/27/12 1251     11/27/12 1400   azithromycin (ZITHROMAX) tablet 500 mg  Status:  Discontinued        500 mg Oral Daily 11/27/12 1252 11/27/12 1724   11/27/12 1400   imipenem-cilastatin (PRIMAXIN) 250 mg in sodium chloride 0.9 % 100 mL IVPB        250 mg 200 mL/hr over 30 Minutes Intravenous 4 times per day 11/27/12 1333     11/27/12 1000   fluconazole (DIFLUCAN) tablet 100 mg  Status:  Discontinued        100 mg Oral Daily 11/26/12 2048 11/27/12 1248   11/27/12 1000   rifampin (RIFADIN) capsule 600 mg        600 mg Oral Daily 11/26/12 2048     11/27/12 0700   vancomycin (VANCOCIN) IVPB 1000 mg/200 mL premix  Status:  Discontinued        1,000 mg 200 mL/hr over 60 Minutes Intravenous Every 8 hours 11/26/12 2042 11/27/12 1248   11/26/12 2200   clarithromycin (BIAXIN) tablet 1,000 mg  Status:  Discontinued        1,000 mg Oral Daily with supper 11/26/12 2048 11/27/12 1248   11/26/12 2200   ethambutol (MYAMBUTOL) tablet 800 mg        800 mg Oral Daily  11/26/12 2048     11/26/12 2200   ceFEPIme (MAXIPIME) 1 g in dextrose 5 % 50 mL IVPB  Status:  Discontinued        1 g 100 mL/hr over 30 Minutes Intravenous 3 times per day 11/26/12 2042 11/27/12 1248   11/26/12 2030   vancomycin (VANCOCIN) IVPB 1000 mg/200 mL premix        1,000 mg 200 mL/hr over 60 Minutes Intravenous  Once 11/26/12 2028 11/27/12 0029   11/26/12 1830   cefTRIAXone (ROCEPHIN) 1 g in dextrose 5 % 50 mL IVPB  Status:  Discontinued        1 g 100 mL/hr over 30 Minutes Intravenous Every 24 hours 11/26/12 1816 11/26/12 2048          Medications:  Scheduled:   . antiseptic oral rinse  15 mL Mouth Rinse BID  . enoxaparin (LOVENOX) injection  40 mg Subcutaneous Q24H  . ethambutol  800 mg Oral Daily  . feeding supplement  1 Container  Oral TID BM  . gabapentin  200 mg Oral BID  . imipenem-cilastatin  250 mg Intravenous Q6H  . isoniazid  300 mg Oral Daily  . micafungin [facility]) IV  150 mg Intravenous Daily  . pantoprazole (PROTONIX) IV  40 mg Intravenous Q12H  . vitamin B-6  50 mg Oral Daily  . rifampin  600 mg Oral Daily  . saccharomyces boulardii  250 mg Oral BID  . sodium chloride  3 mL Intravenous Q12H  . sucralfate  1 g Oral TID WC & HS  . vancomycin  1,000 mg Intravenous Q8H    Objective: Vital signs in last 24 hours: Temp:  [98.8 F (37.1 C)-101.1 F (38.4 C)] 100.2 F (37.9 C) (12/28 0500) Pulse Rate:  [82-95] 95  (12/28 0500) Resp:  [14-20] 14  (12/28 0500) BP: (99-102)/(57-64) 102/61 mmHg (12/28 0500) SpO2:  [98 %-99 %] 98 % (12/28 0500)   General appearance: alert, cooperative and no distress Resp: rhonchi bilaterally Cardio: regular rate and rhythm GI: normal findings: bowel sounds normal and soft, non-tender  Lab Results  Basename 12/01/12 0516 11/30/12 0650  WBC 4.4 7.4  HGB 9.5* 9.9*  HCT 30.1* 29.8*  NA 135 138  K 3.9 3.1*  CL 99 102  CO2 25 24  BUN 5* 4*  CREATININE 0.52 0.43*  GLU -- --   Liver Panel No results found for  this basename: PROT:2,ALBUMIN:2,AST:2,ALT:2,ALKPHOS:2,BILITOT:2,BILIDIR:2,IBILI:2 in the last 72 hours Sedimentation Rate No results found for this basename: ESRSEDRATE in the last 72 hours C-Reactive Protein No results found for this basename: CRP:2 in the last 72 hours  Microbiology: Recent Results (from the past 240 hour(s))  URINE CULTURE     Status: Normal   Collection Time   11/26/12  6:13 PM      Component Value Range Status Comment   Specimen Description URINE, CLEAN CATCH   Final    Special Requests NONE   Final    Culture  Setup Time 11/26/2012 20:02   Final    Colony Count NO GROWTH   Final    Culture NO GROWTH   Final    Report Status 11/27/2012 FINAL   Final   CLOSTRIDIUM DIFFICILE BY PCR     Status: Normal   Collection Time   11/26/12 10:32 PM      Component Value Range Status Comment   C difficile by pcr NEGATIVE  NEGATIVE Final   CULTURE, EXPECTORATED SPUTUM-ASSESSMENT     Status: Normal   Collection Time   11/27/12 11:47 PM      Component Value Range Status Comment   Specimen Description SPUTUM   Final    Special Requests NONE   Final    Sputum evaluation     Final    Value: MICROSCOPIC FINDINGS SUGGEST THAT THIS SPECIMEN IS NOT REPRESENTATIVE OF LOWER RESPIRATORY SECRETIONS. PLEASE RECOLLECT.     CALLED TO RN [facility] AT 0148 11/28/12 LGP   Report Status 11/28/2012 FINAL   Final     Studies/Results: Dg Chest 2 View  11/30/2012  *RADIOLOGY REPORT*  Clinical Data: Fever.  Chest pain.  Weakness.  CHEST - 2 VIEW  Comparison: 11/26/2012  Findings: Emphysema noted with scarring along the right apical wedge resections.  Chronic lucency at the right lung apex previously characterized as a loculated hydropneumothorax, slightly larger than on prior radiographs although this may be partially projectional.  There is mild elevation right hemidiaphragm along with old right rib deformities.  IMPRESSION:  1.  There  is slight increase in size of the right apical  hydropneumothorax. 2.  Emphysema. 3.  Stable scarring along the right upper lung wedge resection sites.   Original Report Authenticated By: Gaylyn Rong, M.D.      Assessment/Plan: Non-tuberculous Mycobacteria. (NTM)  ? HCAP  Esophagitis, post-operative mediastinal changes  Loculated areas R Lung, R hydropneumothorax   Total days of antibiotics 9 (INH/Rifampin/ETH/Azithro)  Day 5 Vanco/Imipenem/Mycfungin   Would- continue to observe- she looks and feels better over last 24.  cont her therapy for NTM  Could consider repeating her CT scan of the chest to re-eval her fluid collections (increased on yesterdays f/u CXR) if she has further fevers.  Will stop mycafungin  Johny Sax Infectious Diseases [phone] 12/01/2012, 11:22 AM   LOS: 5 days

## 2012-12-01 NOTE — Progress Notes (Signed)
Physical Therapy Treatment Patient Details Name: [name] [name] MRN: [id number] DOB: 1956-10-13 Today's Date: 12/01/2012 Time: 4696-2952 PT Time Calculation (min): 15 min  PT Assessment / Plan / Recommendation Comments on Treatment Session  Improved ambulation today. Will complete balance assessment at next session.    Follow Up Recommendations  Home health PT;Supervision/Assistance - 24 hour     Does the patient have the potential to tolerate intense rehabilitation     Barriers to Discharge        Equipment Recommendations  None recommended by PT    Recommendations for Other Services    Frequency Min 3X/week   Plan Discharge plan remains appropriate;Frequency remains appropriate    Precautions / Restrictions Precautions Precautions: None Restrictions Weight Bearing Restrictions: No   Pertinent Vitals/Pain     Mobility  Bed Mobility Bed Mobility: Supine to Sit;Sit to Supine Supine to Sit: 6: Modified independent (Device/Increase time);With rails;HOB elevated Sit to Supine: 6: Modified independent (Device/Increase time);With rail Details for Bed Mobility Assistance: No verbal cues or assist needed Transfers Transfers: Sit to Stand;Stand to Sit;Stand Pivot Transfers Sit to Stand: 5: Supervision;With upper extremity assist;From bed;With armrests;From chair/3-in-1 Stand to Sit: 4: Min guard;With upper extremity assist;With armrests;To chair/3-in-1;To bed Stand Pivot Transfers: 4: Min guard Details for Transfer Assistance: Verbal cues for hand placement and safety. Ambulation/Gait Ambulation/Gait Assistance: 5: Supervision Ambulation Distance (Feet): 400 Feet Assistive device: None Ambulation/Gait Assistance Details: Patient with stable gait with good gait pattern.  Increased gait velocity from 12/26 session. Gait Pattern: Step-through pattern;Decreased stride length Gait velocity: WFL      PT Goals Acute Rehab PT Goals PT Goal: Sit to Stand - Progress:  Progressing toward goal PT Goal: Ambulate - Progress: Progressing toward goal  Visit Information  Last PT Received On: 12/01/12 Assistance Needed: +1    Subjective Data  Subjective: "I'm ready to walk today.  I feel better than yesterday."   Cognition  Overall Cognitive Status: Appears within functional limits for tasks assessed/performed Arousal/Alertness: Awake/alert Orientation Level: Oriented X4 / Intact Behavior During Session: [facility] for tasks performed    Balance     End of Session PT - End of Session Equipment Utilized During Treatment: Gait belt Activity Tolerance: Patient tolerated treatment well Patient left: in bed;with call bell/phone within reach;with family/visitor present Nurse Communication: Mobility status   GP     Vena Austria 12/01/2012, 12:55 PM Durenda Hurt. Renaldo Fiddler, [facility] Acute Rehab Services Pager [phone]

## 2012-12-01 NOTE — Progress Notes (Signed)
PATIENT DETAILS Name: [name] [name] Age: 56 y.o. Sex: female Date of Birth: 09-03-1956 Admit Date: 11/26/2012 Admitting Physician Standley Brooking, MD NWG:NFAO Drue Novel, MD  Subjective: Right sided Chest pain  better. Odynophagia almost resolved. Claims b/l hand/fingers better as well-almost resolved Today he feels overall better although still weak. Was able to walk with the nurse tech early this morning. She is very claustrophobic and really wants to go home but understands she needs to stay in the hospital at least today.  Assessment/Plan: Principal Problem:  *Pleuritic chest pain  - CTA of the chest negative for pulmonary embolism - CT of the chest showing a lot of postoperative changes, but also showing a soft tissue density in the posterior mediastinum. Seen by Dr Morton Peters from cardiothoracic surgery,does not think soft tissue density is of any significance -Per Dr Hebert Soho is likely 2/2 post thoractomy pain-suggested Ultram and Neurontin. This is clearly improving  Active Problems: Fever with Leukocytosis -?etiology -fever 101.1 in the last 24 hours. -Leukocytosis has resolved -appreciate ID input -antibiotics being adjusted by infectious diseases physicians. 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 Luciana Axe- who presumed that this is a MAC infection (rather than mycobacterium TB- as PPD and Quantiferon were negative)- patient as a result was started on rifampin, ethambutol and Biaxin.  Esophagitis - No oral thrush-but at risk for candidal esophagitis - On Empiric Micafungin - Change PPI to twice a day -appreciate GI input-odynophagia better-tolerating dys 3 diet with no difficulty   Diarrhea - Stool C. 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   Anemia - Suspect this is chronic-from chronic disease - Monitor H&H periodically - Anemia panel if hemoglobin drops further-but is stable  Hypokalemia -replete and recheck in am  GERD - Continue with PPI- but change to twice a day given esophageal thickening seen on the CT  Disposition: Remain inpatient  DVT Prophylaxis: Prophylactic Lovenox   Code Status: Full code  Procedures:  None  CONSULTS:  ID  PHYSICAL EXAM: Vital signs in last 24 hours: Filed Vitals:   11/30/12 1426 11/30/12 1527 11/30/12 2100 12/01/12 0500  BP: 99/64  99/57 102/61  Pulse: 86  82 95  Temp: 98.8 F (37.1 C) 101.1 F (38.4 C) 99.6 F (37.6 C) 100.2 F (37.9 C)  TempSrc: Oral  Oral Oral  Resp: 20  20 14   Height:      Weight:      SpO2: 98%  99% 98%    Weight change:  Body mass index is 18.08 kg/(m^2).   Gen Exam: Awake and alert with clear speech.   Neck: Supple, No JVD.   Chest: B/L Clear.   CVS: S1 S2 Regular, no murmurs.  Abdomen: soft, BS +, non tender, non distended.  Extremities: no edema, lower extremities warm to touch.No swelling or redness noted in any of her fingers-now able to make a fist in both hands Neurologic: Non Focal.   Skin: No Rash.   Wounds: N/A.    Intake/Output from previous day:  Intake/Output Summary (Last 24 hours) at 12/01/12 0759 Last data filed at 11/30/12 1300  Gross per 24 hour  Intake    120 ml  Output      0 ml  Net    120 ml     LAB RESULTS: CBC  Lab 12/01/12 0516 11/30/12 0650 11/29/12 0715 11/28/12 0644 11/27/12 0556 11/26/12 1631  WBC 4.4 7.4 10.1 16.7* 9.6 --  HGB 9.5* 9.9* 9.2* 9.7* 9.5* --  HCT 30.1* 29.8* 28.2* 29.3* 29.6* --  PLT 421* 446* 380 423* 473* --  MCV 95.0 94.9 95.6 94.8 96.7 --  MCH 30.0 31.5 31.2 31.4 31.0 --  MCHC 31.6 33.2 32.6 33.1 32.1 --  RDW 13.9  13.8 13.7 13.5 13.6 --  LYMPHSABS -- -- -- -- -- 0.6*  MONOABS -- -- -- -- -- 1.0  EOSABS -- -- -- -- -- 0.0  BASOSABS -- -- -- -- -- 0.0  BANDABS -- -- -- -- -- --    Chemistries   Lab 12/01/12 0516 11/30/12 0650 11/29/12 0715 11/28/12 0644 11/27/12 0556  NA 135 138 140 135 135  K 3.9 3.1* 3.0* 3.0* 3.4*  CL 99 102 103 97 99  CO2 25 24 25 25 24   GLUCOSE 106* 102* 119* 118* 114*  BUN 5* 4* 4* 4* 4*  CREATININE 0.52 0.43* 0.42* 0.45* 0.45*  CALCIUM 8.5 8.6 8.4 8.8 8.8  MG -- -- -- -- --    CBG: No results found for this basename: GLUCAP:5 in the last 168 hours  GFR Estimated Creatinine Clearance: 64.5 ml/min (by C-G formula based on Cr of 0.52).  Coagulation profile No results found for this basename: INR:5,PROTIME:5 in the last 168 hours  Cardiac Enzymes  Lab 11/27/12 1200  CKMB --  TROPONINI <0.30  MYOGLOBIN --    No components found with this basename: POCBNP:3 No results found for this basename: DDIMER:2 in the last 72 hours No results found for this basename: HGBA1C:2 in the last 72 hours No results found for this basename: CHOL:2,HDL:2,LDLCALC:2,TRIG:2,CHOLHDL:2,LDLDIRECT:2 in the last 72 hours No results found for this basename: TSH,T4TOTAL,FREET3,T3FREE,THYROIDAB in the last 72 hours No results found for this basename: VITAMINB12:2,FOLATE:2,FERRITIN:2,TIBC:2,IRON:2,RETICCTPCT:2 in the last 72 hours No results found for this basename: LIPASE:2,AMYLASE:2 in the last 72 hours  Urine Studies No results found for this basename: UACOL:2,UAPR:2,USPG:2,UPH:2,UTP:2,UGL:2,UKET:2,UBIL:2,UHGB:2,UNIT:2,UROB:2,ULEU:2,UEPI:2,UWBC:2,URBC:2,UBAC:2,CAST:2,CRYS:2,UCOM:2,BILUA:2 in the last 72 hours  MICROBIOLOGY: Recent Results (from the past 240 hour(s))  URINE CULTURE     Status: Normal   Collection Time   11/26/12  6:13 PM      Component Value Range Status Comment   Specimen Description URINE, CLEAN CATCH   Final    Special Requests NONE   Final    Culture  Setup Time  11/26/2012 20:02   Final    Colony Count NO GROWTH   Final    Culture NO GROWTH   Final    Report Status 11/27/2012 FINAL   Final   CLOSTRIDIUM DIFFICILE BY PCR     Status: Normal   Collection Time   11/26/12 10:32 PM      Component Value Range Status Comment   C difficile by pcr NEGATIVE  NEGATIVE Final   CULTURE, EXPECTORATED SPUTUM-ASSESSMENT     Status: Normal   Collection Time   11/27/12 11:47 PM      Component Value Range Status Comment   Specimen Description SPUTUM   Final    Special Requests NONE   Final    Sputum evaluation     Final    Value: MICROSCOPIC FINDINGS SUGGEST THAT THIS SPECIMEN IS NOT REPRESENTATIVE OF LOWER RESPIRATORY  SECRETIONS. PLEASE RECOLLECT.     CALLED TO RN [facility] AT 0148 11/28/12 LGP   Report Status 11/28/2012 FINAL   Final     RADIOLOGY STUDIES/RESULTS: Dg Chest 2 View  11/26/2012  *RADIOLOGY REPORT*  Clinical Data: Fever  CHEST - 2 VIEW  Comparison: 09/2012  Findings: Interstitial prominence.  Bronchitic changes.  Increased AP diameter of the chest.  Mild cardiomegaly.  Normal pulmonary vascularity.  Loculated right apical hydropneumothorax has improved.  Heterogeneous opacities throughout the right mid and lower lung zones have improved.  Minimal residual subsegmental atelectasis at the left base.  IMPRESSION: Improved loculated right hydropneumothorax.  Improved heterogeneous pulmonary opacities in the right mid and lower lung zones.   Original Report Authenticated By: Jolaine Click, M.D.    Dg Chest 2 View  11/16/2012  *RADIOLOGY REPORT*  Clinical Data: Pneumonia  CHEST - 2 VIEW  Comparison: Yesterday  Findings: Right chest tube has been removed.  Stable right hydropneumothorax.  Opacities within the right lower lung zone and right suprahilar density are stable.  Interstitial prominence throughout the left lung with hyperaeration is stable.  No left pneumothorax.  Normal heart size.  IMPRESSION: Stable right hydropneumothorax after chest tube  removal.   Original Report Authenticated By: Jolaine Click, M.D.    Dg Chest 2 View  11/15/2012  *RADIOLOGY REPORT*  Clinical Data: Shortness of breath, follow up pneumonia  CHEST - 2 VIEW  Comparison: Portable chest x-ray of 11/14/2012 and 11/13/2012  Findings: Aeration has improved slightly.  There is still right apical pneumothorax present with right chest tube unchanged in position.  Right perihilar opacity remains although opacity noted previously at the right lung base has improved.  The heart is within normal limits in size.  IMPRESSION:  1.  Stable right apical pneumothorax with right chest tube remaining. 2.  Some improvement in opacity at the right lung base with haziness overlying the right hilum remaining.   Original Report Authenticated By: Dwyane Dee, M.D.    Dg Chest 2 View  11/02/2012  *RADIOLOGY REPORT*  Clinical Data: Preop.  CHEST - 2 VIEW  Comparison: 08/12/2012  Findings: Right apical pleural/parenchymal density and cavitation again noted, which may be slightly progressed since prior study. Left apical scarring noted.  Heart is normal size.  No effusions. No acute bony abnormality.  IMPRESSION: Right apical density may have progressed slightly since prior study.   Original Report Authenticated By: Charlett Nose, M.D.    Ct Chest W Contrast  11/15/2012  *RADIOLOGY REPORT*  Clinical Data: Fever and right chest pain.  Status post right lobectomy.  Smoker.  CT CHEST WITH CONTRAST  Technique:  Multidetector CT imaging of the chest was performed following the standard protocol during bolus administration of intravenous contrast.  Contrast: 80mL OMNIPAQUE IOHEXOL 300 MG/ML  SOLN  Comparison: Chestradiographs obtained earlier today and chest CT dated 08/15/2012.  Findings: Interval resection of the cavitary mass in the right upper lobe with multiple surgical staple lines and surgical clips. Approximately 20% right hydropneumothorax.  A portion of this is located at the apex and another portion  is posteromedially located and appears partially loculated.  No fluid collections with peripheral rim enhancement are seen.  There is minimal atelectasis at both lung bases.  Bullous changes in the left lung are again demonstrated, remaining most pronounced at the medial aspect of the left upper lobe.  No lung masses or enlarged lymph nodes are seen.  A 1 cm cyst in the right lobe of the liver  is unchanged.  Mild thoracic spine degenerative changes.  IMPRESSION:  1.  Right postoperative changes with an approximately 20% right hydropneumothorax. 2.  No abscess or evidence of lung empyema. 3.  Minimal bibasilar atelectasis. 4.  Stable changes of COPD in the left lung.   Original Report Authenticated By: Beckie Salts, M.D.    Ct Angio Chest Pe W/cm &/or Wo Cm  11/27/2012  *RADIOLOGY REPORT*  Clinical Data: Pleuritic chest pain, worsening shortness of breath and fever.  History of right upper lobectomy and MAC.  CT ANGIOGRAPHY CHEST  Technique:  Multidetector CT imaging of the chest using the standard protocol during bolus administration of intravenous contrast. Multiplanar reconstructed images including MIPs were obtained and reviewed to evaluate the vascular anatomy.  Contrast: 80mL OMNIPAQUE IOHEXOL 350 MG/ML SOLN  Comparison: CT of the chest performed 11/15/2012  Findings: There is no evidence of significant pulmonary embolus. Evaluation for pulmonary embolus is mildly suboptimal due to motion artifact.  There is a persistent small right-sided hydropneumothorax, most prominent at the right lung apex; this is grossly unchanged in appearance from the prior study.  Extensive postoperative change is noted along the right side of the mediastinum, with associated apparent dense sutures and underlying atelectasis.  Significant associated soft tissue density is of uncertain significance, tracking inferiorly along the posterior mediastinum.  The esophagus is obscured due to surrounding soft tissue attenuation.  The degree  of soft tissue inflammation about the esophagus appears significantly worsened from the prior study; this could conceivably reflect diffuse esophagitis.  Previously noted dense airspace opacification within the inferior right middle lobe has improved.  There is a persistent somewhat loculated small right-sided pleural effusion; previously noted air components within the effusion are less prominent.  Scattered blebs are seen at the left lung apex, and underlying emphysema is noted throughout both lungs, more prominent at the lung apices.  The mediastinum is otherwise grossly unremarkable, though the significance of diffuse soft tissue attenuation along the right side of the mediastinum and posterior mediastinum is uncertain.  No pericardial effusion is identified.  The great vessels are grossly unremarkable in appearance.  No definite mediastinal lymphadenopathy is seen.  No axillary lymphadenopathy is seen.  The thyroid gland is unremarkable in appearance.  The visualized portions of the liver and spleen are unremarkable.  No acute osseous abnormalities are seen.  Chronic right-sided rib deformity is noted.  IMPRESSION:  1.  No evidence of significant pulmonary embolus. 2.  Extensive postoperative change again noted along the right side of the mediastinum, with associated soft tissue density tracking inferiorly along the posterior mediastinum, of uncertain significance.  The esophagus is obscured due to surrounding soft tissue attenuation.  Would correlate for history of malignancy; this could simply reflect postoperative change.  The degree of soft tissue inflammation about the esophagus appears significantly worsened from the recent prior study.  This could conceivably reflect diffuse esophagitis.  3.  Persistent small right-sided hydropneumothorax, most prominent at the right lung apex.  Loculated components noted along the right lung. 4.  Previously noted dense consolidation within the inferior right middle lung  lobe has improved. 5.  Scattered underlying emphysema noted, with blebs seen at the left lung apex. 6.  Chronic right-sided rib deformity again noted.   Original Report Authenticated By: Tonia Ghent, M.D.    Dg Chest Port 1 View  11/14/2012  *RADIOLOGY REPORT*  Clinical Data: Right lower lobe pneumonia.  Status post VATS.  PORTABLE CHEST - 1 VIEW  Comparison: 11/13/2012  Findings: 0616 hours right chest tube remains in place.  The apical component of the pneumothorax is still visible and not substantially changed in the interval.  Some mild bibasilar atelectasis or infiltrate persist.  No overt pulmonary edema. Heart size is stable. Telemetry leads overlie the chest.  IMPRESSION: Stable exam.   Original Report Authenticated By: Kennith Center, M.D.    Dg Chest Port 1 View  11/13/2012  *RADIOLOGY REPORT*  Clinical Data: Right-sided chest tube  PORTABLE CHEST - 1 VIEW  Comparison: 11/12/2012; 11/07/2012; 08/12/2012; chest CT - 08/15/2012  Findings:  Grossly unchanged cardiac silhouette and mediastinal contours given reduced lung volumes of patient rotation.  Interval removal of right jugular approach central venous catheter and the more inferior and laterally positioned right-sided chest tube.  The apically directed right-sided chest tube is unchanged in position. Grossly unchanged small right apical pneumothorax.  Heterogeneous air space opacities within the remaining right lung are grossly unchanged.  Grossly unchanged left basilar heterogeneous opacities. No new focal airspace opacity.  Query trace right-sided effusion, likely unchanged.  Unchanged bones.  IMPRESSION: 1.  Interval removal of support apparatus with unchanged positioning of remaining apically directed right-sided chest tube. Grossly unchanged small residual right-sided hydropneumothorax.  2.  Grossly unchanged heterogeneous air space opacities within the remaining right lung   Original Report Authenticated By: Tacey Ruiz, MD    Dg Chest  Port 1 View  11/12/2012  *RADIOLOGY REPORT*  Clinical Data: Follow-up lobectomy.  PORTABLE CHEST - 1 VIEW  Comparison: 11/11/2012  Findings: Upper and lower right-sided chest tubes remain  in good position.  Approximately 10% right apical pneumothorax unchanged. Slight left basilar opacity may be minimally improved.  Central venous catheter tip unchanged at cavoatrial junction. Cardiomegaly.  Moderate airspace disease right lung stable, with right hemithorax volume loss.  IMPRESSION: Stable right apical pneumothorax (10%).  Slight improvement left basilar aeration.   Original Report Authenticated By: Davonna Belling, M.D.    Dg Chest Port 1 View  11/11/2012  *RADIOLOGY REPORT*  Clinical Data: 55 year old female - postoperative right lobectomy for cavitary lesion.  PORTABLE CHEST - 1 VIEW  Comparison: 11/10/2012 and prior chest radiographs dating back to 2009.  Findings: Right thoracic postoperative changes are again identified. Two separate right thoracostomy tubes and a right IJ central venous catheter with tip overlying the cavoatrial junction again noted. Right hemithorax volume loss is present with airspace opacities within the remaining right lung, slightly improved since the prior study. A small (10%) right apical pneumothorax is unchanged. Mild left basilar opacity is again noted question atelectasis versus developing airspace disease.  IMPRESSION: Improved right lung aeration with continued small right apical pneumothorax (10%).  Mild left basilar opacity again noted which may represent atelectasis or developing airspace disease/pneumonia.  Stable support apparatus as described.   Original Report Authenticated By: Harmon Pier, M.D.    Dg Chest Port 1 View  11/10/2012  *RADIOLOGY REPORT*  Clinical Data: Evaluate right chest tubes.  PORTABLE CHEST - 1 VIEW  Comparison: 11/09/2012  Findings: Stable position of the right chest drains.  There is stable right apical pneumothorax that roughly measures 15%.  Persistent parenchymal densities throughout the right lung.  Left lung is clear with exception of left basilar atelectasis.  Heart and mediastinum are stable.  The jugular central venous catheter is in the lower SVC region.  IMPRESSION: No significant change in the size of the right pneumothorax. Stable patchy densities throughout the right lung.  New patchy densities at the left  lung may represent atelectasis.   Original Report Authenticated By: Richarda Overlie, M.D.    Dg Chest Port 1 View  11/09/2012  *RADIOLOGY REPORT*  Clinical Data: Chest tubes.  Pneumothorax.  PORTABLE CHEST - 1 VIEW  Comparison: Chest 11/08/2012.  Findings: Two right chest tubes and right IJ catheter remain in place.  Small right apical pneumothorax is unchanged in appearance. There has been increase in right pleural effusion.  Airspace disease throughout the right chest appears increased.  Volume loss on the right with shift of the mediastinal contents again from left to right is again seen.  Left lung is clear.  Cardiac silhouette is partially obscured.  IMPRESSION:  1.  No change in a small right pneumothorax with a chest tube in place. 2.  Increased right effusion and airspace disease.   Original Report Authenticated By: Holley Dexter, M.D.    Dg Chest Port 1 View  11/08/2012  *RADIOLOGY REPORT*  Clinical Data: Post thoracotomy and lobectomy, evaluate pneumothorax  PORTABLE CHEST - 1 VIEW  Comparison: 11/07/2012; 11/06/2012; 11/05/2012  Findings:  Grossly unchanged cardiac silhouette and mediastinal contours with persistent deviation of the cardiomediastinal structures into the right hemithorax.  Stable position of support apparatus.  No change to minimal decrease in small to moderate-sized right-sided hydropneumothorax.  Worsening heterogeneous opacities in the remaining right lung.  The left hemithorax is grossly unchanged with minimal left basilar opacities favored to represent atelectasis.  Unchanged bones.  IMPRESSION: 1.  Stable  positioning of support apparatus.  No change to minimal decrease in small to moderate-sized right-sided hydropneumothorax. 2.  Worsening heterogeneous opacities within the remaining right lung with differential considerations including atelectasis, edema or developing infection.  Continued attention on follow-up is recommended.   Original Report Authenticated By: Tacey Ruiz, MD    Dg Chest Port 1 View  11/07/2012  *RADIOLOGY REPORT*  Clinical Data: Status post right lobectomy.  PORTABLE CHEST - 1 VIEW  Comparison: One-view chest 11/06/2012.  Findings: The heart size is normal.  The right apical pneumothorax has increased in size.  Lung density has increased on the right as well.  The chest tubes are stable.  Minimal atelectasis is present at the left base.  IMPRESSION:  1.  Increasing right apical pneumothorax. 2.  Increasing consolidation of the residual right lung. 3.  The support apparatus is stable.   Original Report Authenticated By: Marin Roberts, M.D.    Dg Chest Port 1 View  11/06/2012  *RADIOLOGY REPORT*  Clinical Data: VATS.  PORTABLE CHEST - 1 VIEW  Comparison: 11/05/2012  Findings: Two right chest tubes remain in place.  Postoperative changes in the right lung.  Suspect small residual right apical pneumothorax although the pleural edges difficult to visualize today's study.  Lucency around the superior chest tube may reflect a small residual pneumothorax.  No confluent airspace opacities in the lungs.  Heart is normal size.  Subcutaneous air in the right chest wall, slightly increased.  IMPRESSION: Two right chest tube remain in place.  Suspect tiny residual right apical pneumothorax around the superior chest tube.   Original Report Authenticated By: Charlett Nose, M.D.    Dg Chest Portable 1 View  11/05/2012  *RADIOLOGY REPORT*  Clinical Data: Postop.  Status post VATS.  PORTABLE CHEST - 1 VIEW  Comparison: 11/02/2012  Findings: Lungs are hyperinflated.  Right-sided IJ central line tip  overlies the level of the superior vena cava.  Dual right-sided chest tubes are in place.  There is a small right  apical pneumothorax, associated with apical pleural thickening and apical lung sutures.  Perihilar bronchitic changes are again noted.  No evidence for pulmonary edema.  IMPRESSION:  1.  Postoperative appearance of the chest. 2.  Small right apical pneumothorax.   Original Report Authenticated By: Norva Pavlov, M.D.     MEDICATIONS: Scheduled Meds:    . antiseptic oral rinse  15 mL Mouth Rinse BID  . enoxaparin (LOVENOX) injection  40 mg Subcutaneous Q24H  . ethambutol  800 mg Oral Daily  . feeding supplement  1 Container Oral TID BM  . gabapentin  200 mg Oral BID  . imipenem-cilastatin  250 mg Intravenous Q6H  . isoniazid  300 mg Oral Daily  . micafungin [facility]) IV  150 mg Intravenous Daily  . pantoprazole (PROTONIX) IV  40 mg Intravenous Q12H  . vitamin B-6  50 mg Oral Daily  . rifampin  600 mg Oral Daily  . sodium chloride  3 mL Intravenous Q12H  . sucralfate  1 g Oral TID WC & HS  . vancomycin  1,000 mg Intravenous Q8H   Continuous Infusions:    . sodium chloride 20 mL/hr at 11/30/12 1823   PRN Meds:.acetaminophen, acetaminophen, alum & mag hydroxide-simeth, morphine injection, nicotine polacrilex, ondansetron (ZOFRAN) IV, ondansetron, oxyCODONE, traMADol  Antibiotics: Anti-infectives     Start     Dose/Rate Route Frequency Ordered Stop   11/30/12 1600   vancomycin (VANCOCIN) IVPB 1000 mg/200 mL premix        1,000 mg 200 mL/hr over 60 Minutes Intravenous Every 8 hours 11/30/12 1233     11/27/12 1730   isoniazid (NYDRAZID) tablet 300 mg        300 mg Oral Daily 11/27/12 1723     11/27/12 1600   vancomycin (VANCOCIN) IVPB 1000 mg/200 mL premix  Status:  Discontinued        1,000 mg 200 mL/hr over 60 Minutes Intravenous Every 8 hours 11/27/12 1333 11/29/12 1510   11/27/12 1400   micafungin (MYCAMINE) 150 mg in sodium chloride 0.9 % 100 mL IVPB         150 mg 100 mL/hr over 1 Hours Intravenous Daily 11/27/12 1251     11/27/12 1400   azithromycin (ZITHROMAX) tablet 500 mg  Status:  Discontinued        500 mg Oral Daily 11/27/12 1252 11/27/12 1724   11/27/12 1400   imipenem-cilastatin (PRIMAXIN) 250 mg in sodium chloride 0.9 % 100 mL IVPB        250 mg 200 mL/hr over 30 Minutes Intravenous 4 times per day 11/27/12 1333     11/27/12 1000   fluconazole (DIFLUCAN) tablet 100 mg  Status:  Discontinued        100 mg Oral Daily 11/26/12 2048 11/27/12 1248   11/27/12 1000   rifampin (RIFADIN) capsule 600 mg        600 mg Oral Daily 11/26/12 2048     11/27/12 0700   vancomycin (VANCOCIN) IVPB 1000 mg/200 mL premix  Status:  Discontinued        1,000 mg 200 mL/hr over 60 Minutes Intravenous Every 8 hours 11/26/12 2042 11/27/12 1248   11/26/12 2200   clarithromycin (BIAXIN) tablet 1,000 mg  Status:  Discontinued        1,000 mg Oral Daily with supper 11/26/12 2048 11/27/12 1248   11/26/12 2200   ethambutol (MYAMBUTOL) tablet 800 mg        800 mg Oral Daily 11/26/12 2048  11/26/12 2200   ceFEPIme (MAXIPIME) 1 g in dextrose 5 % 50 mL IVPB  Status:  Discontinued        1 g 100 mL/hr over 30 Minutes Intravenous 3 times per day 11/26/12 2042 11/27/12 1248   11/26/12 2030   vancomycin (VANCOCIN) IVPB 1000 mg/200 mL premix        1,000 mg 200 mL/hr over 60 Minutes Intravenous  Once 11/26/12 2028 11/27/12 0029   11/26/12 1830   cefTRIAXone (ROCEPHIN) 1 g in dextrose 5 % 50 mL IVPB  Status:  Discontinued        1 g 100 mL/hr over 30 Minutes Intravenous Every 24 hours 11/26/12 1816 11/26/12 2048           Wilson Singer, MD  [facility] Pager: [phone].  If 7PM-7AM, please contact night-coverage [email or web address] Password [email or web address] 12/01/2012, 7:59 AM   LOS: 5 days

## 2012-12-01 NOTE — Progress Notes (Addendum)
ANTIBIOTIC CONSULT NOTE - FOLLOW UP  Pharmacy Consult for vancomycin Indication: Empiric coverage in setting of chest pain/fevers  Allergies  Allergen Reactions  . Alendronate Sodium Other (See Comments)    REACTION: chest pain  . Antihistamines, Chlorpheniramine-Type Other (See Comments)    REACTION: "makes lungs bleed"  . Benadryl (Diphenhydramine) Other (See Comments)    REACTION: "makes lungs bleed"  . Nsaids Other (See Comments)    REACTION: ischemic colitis  . Other Other (See Comments)    All anti-inflammatories.  REACTION: ischemic colitis  . Pseudoephedrine Other (See Comments)    REACTION: Ischemic colitis  . Robitussin A-C (Guaifenesin-Codeine) Itching and Other (See Comments)    Palms itching  . Levofloxacin Anxiety    Patient Measurements: Height: 5\' 6"  (167.6 cm) Weight: 111 lb 15.9 oz (50.8 kg) IBW/kg (Calculated) : 59.3   Vital Signs: Temp: 101.1 F (38.4 C) (12/28 1347) Temp src: Oral (12/28 1347) BP: 101/68 mmHg (12/28 1347) Pulse Rate: 95  (12/28 1347) Intake/Output from previous day: 12/27 0701 - 12/28 0700 In: 120 [P.O.:120] Out: -  Intake/Output from this shift: Total I/O In: 140 [P.O.:140] Out: -   Labs:  Basename 12/01/12 0516 11/30/12 0650 11/29/12 0715  WBC 4.4 7.4 10.1  HGB 9.5* 9.9* 9.2*  PLT 421* 446* 380  LABCREA -- -- --  CREATININE 0.52 0.43* 0.42*   Estimated Creatinine Clearance: 64.5 ml/min (by C-G formula based on Cr of 0.52).  Basename 12/01/12 1548  VANCOTROUGH 13.8  VANCOPEAK --  VANCORANDOM --  GENTTROUGH --  GENTPEAK --  GENTRANDOM --  TOBRATROUGH --  TOBRAPEAK --  TOBRARND --  AMIKACINPEAK --  AMIKACINTROU --  AMIKACIN --      Assessment: [name] [name] is a 56 year old female on day 6 IV antibiotics HCAP?Marland Kitchen Trough came back slightly subtherapeutic today on 1g IV q8. I'll keep the dose the same since there is a risk of accumulation on this dose  Plan:  Vanc 1g IV q8

## 2012-12-02 DIAGNOSIS — J852 Abscess of lung without pneumonia: Secondary | ICD-10-CM

## 2012-12-02 MED ORDER — PANTOPRAZOLE SODIUM 40 MG PO TBEC
40.0000 mg | DELAYED_RELEASE_TABLET | Freq: Two times a day (BID) | ORAL | Status: DC
  Administered 2012-12-02 – 2012-12-04 (×5): 40 mg via ORAL
  Filled 2012-12-02 (×5): qty 1

## 2012-12-02 NOTE — Progress Notes (Signed)
INFECTIOUS DISEASE PROGRESS NOTE  ID: [name] [name] is a 56 y.o. female with   Principal Problem:  *Pleuritic chest pain Active Problems:  Fever  MAC (mycobacterium avium-intracellulare complex)  Diarrhea  Anemia  Odynophagia  Subjective: Fever to 101 yesterday but fever curve trending down. Feels somewhat better, has occ. CP.   Abtx:  Anti-infectives     Start     Dose/Rate Route Frequency Ordered Stop   12/02/12 0000   vancomycin (VANCOCIN) 1,250 mg in sodium chloride 0.9 % 250 mL IVPB  Status:  Discontinued        1,250 mg 166.7 mL/hr over 90 Minutes Intravenous Every 8 hours 12/01/12 1645 12/01/12 1654   12/02/12 0000   vancomycin (VANCOCIN) IVPB 1000 mg/200 mL premix        1,000 mg 200 mL/hr over 60 Minutes Intravenous Every 8 hours 12/01/12 1654     11/30/12 1600   vancomycin (VANCOCIN) IVPB 1000 mg/200 mL premix  Status:  Discontinued        1,000 mg 200 mL/hr over 60 Minutes Intravenous Every 8 hours 11/30/12 1233 12/01/12 1645   11/27/12 1730   isoniazid (NYDRAZID) tablet 300 mg        300 mg Oral Daily 11/27/12 1723     11/27/12 1600   vancomycin (VANCOCIN) IVPB 1000 mg/200 mL premix  Status:  Discontinued        1,000 mg 200 mL/hr over 60 Minutes Intravenous Every 8 hours 11/27/12 1333 11/29/12 1510   11/27/12 1400   micafungin (MYCAMINE) 150 mg in sodium chloride 0.9 % 100 mL IVPB  Status:  Discontinued        150 mg 100 mL/hr over 1 Hours Intravenous Daily 11/27/12 1251 12/01/12 1131   11/27/12 1400   azithromycin (ZITHROMAX) tablet 500 mg  Status:  Discontinued        500 mg Oral Daily 11/27/12 1252 11/27/12 1724   11/27/12 1400   imipenem-cilastatin (PRIMAXIN) 250 mg in sodium chloride 0.9 % 100 mL IVPB        250 mg 200 mL/hr over 30 Minutes Intravenous 4 times per day 11/27/12 1333     11/27/12 1000   fluconazole (DIFLUCAN) tablet 100 mg  Status:  Discontinued        100 mg Oral Daily 11/26/12 2048 11/27/12 1248   11/27/12 1000   rifampin  (RIFADIN) capsule 600 mg        600 mg Oral Daily 11/26/12 2048     11/27/12 0700   vancomycin (VANCOCIN) IVPB 1000 mg/200 mL premix  Status:  Discontinued        1,000 mg 200 mL/hr over 60 Minutes Intravenous Every 8 hours 11/26/12 2042 11/27/12 1248   11/26/12 2200   clarithromycin (BIAXIN) tablet 1,000 mg  Status:  Discontinued        1,000 mg Oral Daily with supper 11/26/12 2048 11/27/12 1248   11/26/12 2200   ethambutol (MYAMBUTOL) tablet 800 mg        800 mg Oral Daily 11/26/12 2048     11/26/12 2200   ceFEPIme (MAXIPIME) 1 g in dextrose 5 % 50 mL IVPB  Status:  Discontinued        1 g 100 mL/hr over 30 Minutes Intravenous 3 times per day 11/26/12 2042 11/27/12 1248   11/26/12 2030   vancomycin (VANCOCIN) IVPB 1000 mg/200 mL premix        1,000 mg 200 mL/hr over 60 Minutes Intravenous  Once 11/26/12  2028 11/27/12 0029   11/26/12 1830   cefTRIAXone (ROCEPHIN) 1 g in dextrose 5 % 50 mL IVPB  Status:  Discontinued        1 g 100 mL/hr over 30 Minutes Intravenous Every 24 hours 11/26/12 1816 11/26/12 2048          Medications:  Scheduled:    . antiseptic oral rinse  15 mL Mouth Rinse BID  . enoxaparin (LOVENOX) injection  40 mg Subcutaneous Q24H  . ethambutol  800 mg Oral Daily  . feeding supplement  1 Container Oral TID BM  . gabapentin  200 mg Oral BID  . imipenem-cilastatin  250 mg Intravenous Q6H  . isoniazid  300 mg Oral Daily  . pantoprazole  40 mg Oral BID AC  . vitamin B-6  50 mg Oral Daily  . rifampin  600 mg Oral Daily  . saccharomyces boulardii  250 mg Oral BID  . sodium chloride  3 mL Intravenous Q12H  . sucralfate  1 g Oral TID WC & HS  . vancomycin  1,000 mg Intravenous Q8H    Objective: Vital signs in last 24 hours: Temp:  [98 F (36.7 C)-101.1 F (38.4 C)] 99.8 F (37.7 C) (12/29 0428) Pulse Rate:  [85-95] 94  (12/29 0428) Resp:  [16-20] 20  (12/29 0428) BP: (101-108)/(66-68) 108/68 mmHg (12/29 0428) SpO2:  [96 %-98 %] 97 % (12/29  0428)   General appearance: alert, cooperative and no distress Resp: rhonchi bilaterally Cardio: regular rate and rhythm GI: normal findings: bowel sounds normal and soft, non-tender  Lab Results  Basename 12/01/12 0516 11/30/12 0650  WBC 4.4 7.4  HGB 9.5* 9.9*  HCT 30.1* 29.8*  NA 135 138  K 3.9 3.1*  CL 99 102  CO2 25 24  BUN 5* 4*  CREATININE 0.52 0.43*  GLU -- --    Microbiology: Recent Results (from the past 240 hour(s))  URINE CULTURE     Status: Normal   Collection Time   11/26/12  6:13 PM      Component Value Range Status Comment   Specimen Description URINE, CLEAN CATCH   Final    Special Requests NONE   Final    Culture  Setup Time 11/26/2012 20:02   Final    Colony Count NO GROWTH   Final    Culture NO GROWTH   Final    Report Status 11/27/2012 FINAL   Final   CLOSTRIDIUM DIFFICILE BY PCR     Status: Normal   Collection Time   11/26/12 10:32 PM      Component Value Range Status Comment   C difficile by pcr NEGATIVE  NEGATIVE Final   CULTURE, EXPECTORATED SPUTUM-ASSESSMENT     Status: Normal   Collection Time   11/27/12 11:47 PM      Component Value Range Status Comment   Specimen Description SPUTUM   Final    Special Requests NONE   Final    Sputum evaluation     Final    Value: MICROSCOPIC FINDINGS SUGGEST THAT THIS SPECIMEN IS NOT REPRESENTATIVE OF LOWER RESPIRATORY SECRETIONS. PLEASE RECOLLECT.     CALLED TO RN S.COLUMBRES AT 0148 11/28/12 LGP   Report Status 11/28/2012 FINAL   Final   CULTURE, BLOOD (ROUTINE X 2)     Status: Normal (Preliminary result)   Collection Time   11/30/12  4:20 PM      Component Value Range Status Comment   Specimen Description BLOOD LEFT ARM   Final  Special Requests BOTTLES DRAWN AEROBIC AND ANAEROBIC 10CC   Final    Culture  Setup Time 11/30/2012 20:40   Final    Culture     Final    Value:        BLOOD CULTURE RECEIVED NO GROWTH TO DATE CULTURE WILL BE HELD FOR 5 DAYS BEFORE ISSUING A FINAL NEGATIVE REPORT    Report Status PENDING   Incomplete   CULTURE, BLOOD (ROUTINE X 2)     Status: Normal (Preliminary result)   Collection Time   11/30/12  4:25 PM      Component Value Range Status Comment   Specimen Description BLOOD LEFT HAND   Final    Special Requests BOTTLES DRAWN AEROBIC AND ANAEROBIC 10CC   Final    Culture  Setup Time 11/30/2012 20:40   Final    Culture     Final    Value:        BLOOD CULTURE RECEIVED NO GROWTH TO DATE CULTURE WILL BE HELD FOR 5 DAYS BEFORE ISSUING A FINAL NEGATIVE REPORT   Report Status PENDING   Incomplete     Studies/Results: Dg Chest 2 View  11/30/2012  *RADIOLOGY REPORT*  Clinical Data: Fever.  Chest pain.  Weakness.  CHEST - 2 VIEW  Comparison: 11/26/2012  Findings: Emphysema noted with scarring along the right apical wedge resections.  Chronic lucency at the right lung apex previously characterized as a loculated hydropneumothorax, slightly larger than on prior radiographs although this may be partially projectional.  There is mild elevation right hemidiaphragm along with old right rib deformities.  IMPRESSION:  1.  There is slight increase in size of the right apical hydropneumothorax. 2.  Emphysema. 3.  Stable scarring along the right upper lung wedge resection sites.   Original Report Authenticated By: Gaylyn Rong, M.D.      Assessment/Plan: Non-tuberculous Mycobacteria. (NTM)  ? HCAP  Esophagitis, post-operative mediastinal changes  Loculated areas R Lung, R hydropneumothorax   MAC pulmonary disease s/p lobectomy: continue with 4 drug regimen of INH/Rifampin/ETH/Azithro. Now day #10  emperic regimen for secondary bacterial infection = currently on  Vanco/Imipenem. If continues to do better, would stop tomorrow since it is a 7 day course of therapy for ? Pneumonia. If she has on going fevers, would have low threshold to repeat CT scan of the chest to re-eval her fluid collections   [facility] [phone] 12/02/2012, 11:55  AM   LOS: 6 days

## 2012-12-02 NOTE — Progress Notes (Signed)
[facility] PROGRESS NOTE  [name] [name] WJX:[id number] DOB: Feb 17, 1956 DOA: 11/26/2012 PCP: Willow Ora, MD  Assessment/Plan: *Pleuritic chest pain  - CTA of the chest negative for pulmonary embolism  - CT of the chest showing a lot of postoperative changes, but also showing a soft tissue density in the posterior mediastinum. Seen by Dr Morton Peters from cardiothoracic surgery,does not think soft tissue density is of any significance  -Per Dr Hebert Soho is likely 2/2 post thoractomy pain-suggested Ultram and Neurontin. This is clearly improving   Fever with Leukocytosis  -?etiology  -fever  in the last 24 hours.  -Leukocytosis has resolved  -appreciate ID input  -antibiotics being adjusted by infectious diseases physicians.  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 Luciana Axe- who presumed that this is a MAC infection (rather than mycobacterium TB- as PPD and Quantiferon were negative)- patient as a result was started on rifampin, ethambutol and Biaxin.   Esophagitis  - No oral thrush-but at risk for candidal esophagitis  - On Empiric Micafungin  - Change PPI to twice a day  -appreciate GI input-odynophagia better-tolerating dys 3 diet with no difficulty   Diarrhea  - Stool C. difficile PCR negative  - Is apparently has been going on for the past few weeks-but better  - No abdominal pain  - Trial of probiotics   Presumed MAC (mycobacterium avium-intracellulare complex)  -Antibiotics managed by ID.   Anemia  - Suspect this is chronic-from chronic disease  - Monitor H&H periodically  - Anemia panel if hemoglobin drops further-but is stable   Hypokalemia  -replete and recheck in am   GERD  - Continue with PPI- but change to twice a day given esophageal thickening seen on the  CT   Code Status: full Family Communication:  Disposition Plan: home once cleared by ID   Consultants:  ID  Procedures:    Antibiotics:    HPI/Subjective: Still with GERD type pain  Objective: Filed Vitals:   12/01/12 1347 12/01/12 1754 12/01/12 2107 12/02/12 0428  BP: 101/68  106/66 108/68  Pulse: 95  85 94  Temp: 101.1 F (38.4 C) 98 F (36.7 C) 99.5 F (37.5 C) 99.8 F (37.7 C)  TempSrc: Oral Oral Oral Oral  Resp: 16  18 20   Height:      Weight:      SpO2: 96%  98% 97%    Intake/Output Summary (Last 24 hours) at 12/02/12 1042 Last data filed at 12/02/12 0900  Gross per 24 hour  Intake    603 ml  Output    350 ml  Net    253 ml   Filed Weights   11/26/12 2020 11/26/12 2121  Weight: 49.896 kg (110 lb) 50.8 kg (111 lb 15.9 oz)    Exam:  Gen Exam: Awake and alert with clear speech.  Neck: Supple, No JVD.  Chest: B/L Clear.  CVS: S1 S2 Regular, no murmurs.  Abdomen: soft, BS +, non tender, non distended.  Extremities: no edema, lower extremities warm to touch.No swelling or redness noted in any of her fingers-now able to make a fist in both hands  Neurologic: Non Focal.  Skin: No Rash.    Data Reviewed: Basic Metabolic Panel:  Lab 12/01/12 2130 11/30/12 8657 11/29/12 0715 11/28/12 8469 11/27/12  0556  NA 135 138 140 135 135  K 3.9 3.1* 3.0* 3.0* 3.4*  CL 99 102 103 97 99  CO2 25 24 25 25 24   GLUCOSE 106* 102* 119* 118* 114*  BUN 5* 4* 4* 4* 4*  CREATININE 0.52 0.43* 0.42* 0.45* 0.45*  CALCIUM 8.5 8.6 8.4 8.8 8.8  MG -- -- -- -- --  PHOS -- -- -- -- --   Liver Function Tests:  Lab 11/28/12 0644 11/26/12 1631  AST 10 18  ALT 8 12  ALKPHOS 163* 167*  BILITOT 0.4 0.5  PROT 6.4 7.1  ALBUMIN 2.0* 2.5*   No results found for this basename: LIPASE:5,AMYLASE:5 in the last 168 hours No results found for this basename: AMMONIA:5 in the last 168 hours CBC:  Lab 12/01/12 0516 11/30/12 0650 11/29/12 0715 11/28/12 0644 11/27/12 0556 11/26/12  1631  WBC 4.4 7.4 10.1 16.7* 9.6 --  NEUTROABS -- -- -- -- -- 13.0*  HGB 9.5* 9.9* 9.2* 9.7* 9.5* --  HCT 30.1* 29.8* 28.2* 29.3* 29.6* --  MCV 95.0 94.9 95.6 94.8 96.7 --  PLT 421* 446* 380 423* 473* --   Cardiac Enzymes:  Lab 11/27/12 1200  CKTOTAL --  CKMB --  CKMBINDEX --  TROPONINI <0.30   BNP (last 3 results) No results found for this basename: PROBNP:3 in the last 8760 hours CBG: No results found for this basename: GLUCAP:5 in the last 168 hours  Recent Results (from the past 240 hour(s))  URINE CULTURE     Status: Normal   Collection Time   11/26/12  6:13 PM      Component Value Range Status Comment   Specimen Description URINE, CLEAN CATCH   Final    Special Requests NONE   Final    Culture  Setup Time 11/26/2012 20:02   Final    Colony Count NO GROWTH   Final    Culture NO GROWTH   Final    Report Status 11/27/2012 FINAL   Final   CLOSTRIDIUM DIFFICILE BY PCR     Status: Normal   Collection Time   11/26/12 10:32 PM      Component Value Range Status Comment   C difficile by pcr NEGATIVE  NEGATIVE Final   CULTURE, EXPECTORATED SPUTUM-ASSESSMENT     Status: Normal   Collection Time   11/27/12 11:47 PM      Component Value Range Status Comment   Specimen Description SPUTUM   Final    Special Requests NONE   Final    Sputum evaluation     Final    Value: MICROSCOPIC FINDINGS SUGGEST THAT THIS SPECIMEN IS NOT REPRESENTATIVE OF LOWER RESPIRATORY SECRETIONS. PLEASE RECOLLECT.     CALLED TO RN S.COLUMBRES AT 0148 11/28/12 LGP   Report Status 11/28/2012 FINAL   Final   CULTURE, BLOOD (ROUTINE X 2)     Status: Normal (Preliminary result)   Collection Time   11/30/12  4:20 PM      Component Value Range Status Comment   Specimen Description BLOOD LEFT ARM   Final    Special Requests BOTTLES DRAWN AEROBIC AND ANAEROBIC 10CC   Final    Culture  Setup Time 11/30/2012 20:40   Final    Culture     Final    Value:        BLOOD CULTURE RECEIVED NO GROWTH TO DATE CULTURE  WILL BE HELD FOR 5 DAYS BEFORE ISSUING A FINAL NEGATIVE REPORT   Report Status PENDING  Incomplete   CULTURE, BLOOD (ROUTINE X 2)     Status: Normal (Preliminary result)   Collection Time   11/30/12  4:25 PM      Component Value Range Status Comment   Specimen Description BLOOD LEFT HAND   Final    Special Requests BOTTLES DRAWN AEROBIC AND ANAEROBIC 10CC   Final    Culture  Setup Time 11/30/2012 20:40   Final    Culture     Final    Value:        BLOOD CULTURE RECEIVED NO GROWTH TO DATE CULTURE WILL BE HELD FOR 5 DAYS BEFORE ISSUING A FINAL NEGATIVE REPORT   Report Status PENDING   Incomplete      Studies: Dg Chest 2 View  11/30/2012  *RADIOLOGY REPORT*  Clinical Data: Fever.  Chest pain.  Weakness.  CHEST - 2 VIEW  Comparison: 11/26/2012  Findings: Emphysema noted with scarring along the right apical wedge resections.  Chronic lucency at the right lung apex previously characterized as a loculated hydropneumothorax, slightly larger than on prior radiographs although this may be partially projectional.  There is mild elevation right hemidiaphragm along with old right rib deformities.  IMPRESSION:  1.  There is slight increase in size of the right apical hydropneumothorax. 2.  Emphysema. 3.  Stable scarring along the right upper lung wedge resection sites.   Original Report Authenticated By: Gaylyn Rong, M.D.     Scheduled Meds:   . antiseptic oral rinse  15 mL Mouth Rinse BID  . enoxaparin (LOVENOX) injection  40 mg Subcutaneous Q24H  . ethambutol  800 mg Oral Daily  . feeding supplement  1 Container Oral TID BM  . gabapentin  200 mg Oral BID  . imipenem-cilastatin  250 mg Intravenous Q6H  . isoniazid  300 mg Oral Daily  . pantoprazole  40 mg Oral BID AC  . vitamin B-6  50 mg Oral Daily  . rifampin  600 mg Oral Daily  . saccharomyces boulardii  250 mg Oral BID  . sodium chloride  3 mL Intravenous Q12H  . sucralfate  1 g Oral TID WC & HS  . vancomycin  1,000 mg Intravenous  Q8H   Continuous Infusions:   . sodium chloride 20 mL/hr at 11/30/12 1823    Principal Problem:  *Pleuritic chest pain Active Problems:  Fever  MAC (mycobacterium avium-intracellulare complex)  Diarrhea  Anemia  Odynophagia    Time spent: 92    [facility], Lucillia Corson  [facility] Pager [phone] If 8PM-8AM, please contact night-coverage at [email or web address], password [facility] 12/02/2012, 10:42 AM  LOS: 6 days

## 2012-12-02 NOTE — Progress Notes (Signed)
Physical Therapy Discharge Patient Details Name: [name] [name] MRN: [id number] DOB: Jun 05, 1956 Today's Date: 12/02/2012 Time: 4098-1191 PT Time Calculation (min): 23 min  Patient discharged from PT services secondary to goals met and no further PT needs identified.  Please see latest therapy progress note for current level of functioning and progress toward goals.    Progress and discharge plan discussed with patient and/or caregiver: Patient/Caregiver agrees with plan  GP     Vena Austria 12/02/2012, 1:15 PM

## 2012-12-02 NOTE — Progress Notes (Signed)
Physical Therapy Treatment Patient Details Name: [name] [name] MRN: [id number] DOB: [birth year] Today's Date: 12/02/2012 Time: 4098-1191 PT Time Calculation (min): 23 min  PT Assessment / Plan / Recommendation Comments on Treatment Session  Patient doing well with ambulation.  Scored 21/24 on DGI balance assessment - low fall risk.  Goals achieved and no further acute PT needs - PT will sign off.  Continue to recommend HHPT for home safety evaluation.    Follow Up Recommendations  Home health PT;Supervision/Assistance - 24 hour     Does the patient have the potential to tolerate intense rehabilitation     Barriers to Discharge        Equipment Recommendations  None recommended by PT    Recommendations for Other Services    Frequency Min 3X/week   Plan Discharge plan remains appropriate    Precautions / Restrictions Precautions Precautions: None Restrictions Weight Bearing Restrictions: No   Pertinent Vitals/Pain     Mobility  Bed Mobility Bed Mobility: Supine to Sit;Sit to Supine Supine to Sit: 6: Modified independent (Device/Increase time);With rails Sit to Supine: 7: Independent Details for Bed Mobility Assistance: No verbal cues or assist needed Transfers Transfers: Sit to Stand;Stand to Sit Sit to Stand: 7: Independent;From bed Stand to Sit: 7: Independent;To bed Details for Transfer Assistance: No cues or assist needed Ambulation/Gait Ambulation/Gait Assistance: 5: Supervision Ambulation Distance (Feet): 400 Feet Assistive device: None Ambulation/Gait Assistance Details: Good gait pattern and balance. Gait Pattern: Step-through pattern;Decreased stride length Gait velocity: WFL Stairs: Yes Stairs Assistance: 4: Min guard Stair Management Technique: One rail Left;Step to pattern;Forwards Number of Stairs: 3       PT Goals Acute Rehab PT Goals PT Goal: Sit to Stand - Progress: Met PT Goal: Ambulate - Progress: Met Additional Goals Additional Goal #1:  Patient will score > 19 on DGI balance assessment to indicate low fall risk. PT Goal: Additional Goal #1 - Progress: Met  Visit Information  Last PT Received On: 12/02/12 Assistance Needed: +1    Subjective Data  Subjective: "I'm ready to go"   Cognition  Overall Cognitive Status: Appears within functional limits for tasks assessed/performed Arousal/Alertness: Awake/alert Orientation Level: Oriented X4 / Intact Behavior During Session: [facility] for tasks performed    Balance  Balance Balance Assessed: Yes Standardized Balance Assessment Standardized Balance Assessment: Dynamic Gait Index Dynamic Gait Index Level Surface: Normal Change in Gait Speed: Mild Impairment Gait with Horizontal Head Turns: Normal Gait with Vertical Head Turns: Normal Gait and Pivot Turn: Normal Step Over Obstacle: Normal Step Around Obstacles: Normal Steps: Moderate Impairment Total Score: 21   End of Session PT - End of Session Equipment Utilized During Treatment: Gait belt Activity Tolerance: Patient limited by fatigue Patient left: in bed;with call bell/phone within reach Nurse Communication: Mobility status (Encouraged ambulation in hallway with nursing)   GP     Vena Austria 12/02/2012, 1:15 PM Durenda Hurt. Renaldo Fiddler, [facility] Acute Rehab Services Pager [phone]

## 2012-12-03 ENCOUNTER — Encounter (HOSPITAL_COMMUNITY)
Admission: EM | Disposition: A | Discharge status: Home / Self Care | Payer: Self-pay | Source: Home / Self Care | Attending: Internal Medicine

## 2012-12-03 DIAGNOSIS — R1319 Other dysphagia: Secondary | ICD-10-CM

## 2012-12-03 HISTORY — PX: ESOPHAGOGASTRODUODENOSCOPY: SHX5428

## 2012-12-03 LAB — CBC
HCT: 33 % — ABNORMAL LOW (ref 36.0–46.0)
Hemoglobin: 10.5 g/dL — ABNORMAL LOW (ref 12.0–15.0)
MCV: 95.7 fL (ref 78.0–100.0)
RDW: 14.3 % (ref 11.5–15.5)
WBC: 8.3 10*3/uL (ref 4.0–10.5)

## 2012-12-03 LAB — BASIC METABOLIC PANEL
BUN: 6 mg/dL (ref 6–23)
CO2: 25 mEq/L (ref 19–32)
Chloride: 97 mEq/L (ref 96–112)
Creatinine, Ser: 0.51 mg/dL (ref 0.50–1.10)
GFR calc Af Amer: >90 mL/min (ref >90)
Glucose, Bld: 192 mg/dL — ABNORMAL HIGH (ref 70–99)

## 2012-12-03 LAB — FUNGUS CULTURE W SMEAR: Fungal Smear: NONE SEEN

## 2012-12-03 SURGERY — EGD (ESOPHAGOGASTRODUODENOSCOPY)
Anesthesia: Moderate Sedation

## 2012-12-03 MED ORDER — AZITHROMYCIN 600 MG PO TABS
600.0000 mg | ORAL_TABLET | Freq: Every day | ORAL | Status: DC
  Administered 2012-12-03 – 2012-12-04 (×2): 600 mg via ORAL
  Filled 2012-12-03 (×2): qty 1

## 2012-12-03 MED ORDER — MIDAZOLAM HCL 5 MG/ML IJ SOLN
INTRAMUSCULAR | Status: AC
  Filled 2012-12-03: qty 2

## 2012-12-03 MED ORDER — FENTANYL CITRATE 0.05 MG/ML IJ SOLN
INTRAMUSCULAR | Status: AC
  Filled 2012-12-03: qty 2

## 2012-12-03 MED ORDER — ETHAMBUTOL HCL 400 MG PO TABS
800.0000 mg | ORAL_TABLET | Freq: Every day | ORAL | Status: DC
  Administered 2012-12-04: 800 mg via ORAL
  Filled 2012-12-03 (×2): qty 2

## 2012-12-03 MED ORDER — POTASSIUM CHLORIDE 10 MEQ/100ML IV SOLN
10.0000 meq | INTRAVENOUS | Status: AC
  Filled 2012-12-03 (×3): qty 100

## 2012-12-03 MED ORDER — MIDAZOLAM HCL 10 MG/2ML IJ SOLN
INTRAMUSCULAR | Status: DC | PRN
  Administered 2012-12-03: 2 mg via INTRAVENOUS
  Administered 2012-12-03: 1 mg via INTRAVENOUS
  Administered 2012-12-03: 2 mg via INTRAVENOUS
  Administered 2012-12-03: 1 mg via INTRAVENOUS

## 2012-12-03 MED ORDER — FENTANYL CITRATE 0.05 MG/ML IJ SOLN
INTRAMUSCULAR | Status: DC | PRN
  Administered 2012-12-03: 10 ug via INTRAVENOUS
  Administered 2012-12-03 (×2): 25 ug via INTRAVENOUS

## 2012-12-03 MED ORDER — POTASSIUM CHLORIDE CRYS ER 20 MEQ PO TBCR
40.0000 meq | EXTENDED_RELEASE_TABLET | Freq: Once | ORAL | Status: AC
  Administered 2012-12-03: 40 meq via ORAL
  Filled 2012-12-03: qty 2

## 2012-12-03 MED ORDER — BUTAMBEN-TETRACAINE-BENZOCAINE 2-2-14 % EX AERO
INHALATION_SPRAY | CUTANEOUS | Status: DC | PRN
  Administered 2012-12-03: 2 via TOPICAL

## 2012-12-03 NOTE — Interval H&P Note (Signed)
History and Physical Interval Note:  12/03/2012 4:59 PM  [name] [name]  has presented today for surgery, with the diagnosis of pain with swallowing  The various methods of treatment have been discussed with the patient and family. After consideration of risks, benefits and other options for treatment, the patient has consented to  Procedure(s) (LRB) with comments: ESOPHAGOGASTRODUODENOSCOPY (EGD) (N/A) as a surgical intervention .  The patient's history has been reviewed, patient examined, no change in status, stable for surgery.  I have reviewed the patient's chart and labs.  Questions were answered to the patient's satisfaction.     Lina Sar

## 2012-12-03 NOTE — Progress Notes (Signed)
Inpatient Diabetes Program Recommendations  AACE/ADA: New Consensus Statement on Inpatient Glycemic Control  Target Ranges:  Prepandial:   less than 140 mg/dL      Peak postprandial:   less than 180 mg/dL (1-2 hours)      Critically ill patients:  140 - 180 mg/dL  Pager:  [phone] Hours:  8 am-10pm   Reason for Visit: Elevated glucose: 192 mg/dL  Inpatient Diabetes Program Recommendations Correction (SSI): If glucose remains elevated, add Novolog correction HgbA1C: Check HgbA1C  Alfredia Client PhD, RN, BC-ADM Diabetes Coordinator  Office:  [phone] Team Pager:  [phone]

## 2012-12-03 NOTE — Progress Notes (Signed)
INFECTIOUS DISEASE PROGRESS NOTE  ID: [name] [name] is a 56 y.o. female with   Principal Problem:  *Pleuritic chest pain Active Problems:  Fever  MAC (mycobacterium avium-intracellulare complex)  Diarrhea  Anemia  Odynophagia  Subjective:  remains afebrile x 2.5 days. Feels somewhat better, has occ. CP after eating  Abtx:  Anti-infectives     Start     Dose/Rate Route Frequency Ordered Stop   12/02/12 0000   vancomycin (VANCOCIN) 1,250 mg in sodium chloride 0.9 % 250 mL IVPB  Status:  Discontinued        1,250 mg 166.7 mL/hr over 90 Minutes Intravenous Every 8 hours 12/01/12 1645 12/01/12 1654   12/02/12 0000   vancomycin (VANCOCIN) IVPB 1000 mg/200 mL premix        1,000 mg 200 mL/hr over 60 Minutes Intravenous Every 8 hours 12/01/12 1654 12/03/12 2359   11/30/12 1600   vancomycin (VANCOCIN) IVPB 1000 mg/200 mL premix  Status:  Discontinued        1,000 mg 200 mL/hr over 60 Minutes Intravenous Every 8 hours 11/30/12 1233 12/01/12 1645   11/27/12 1730   isoniazid (NYDRAZID) tablet 300 mg        300 mg Oral Daily 11/27/12 1723     11/27/12 1600   vancomycin (VANCOCIN) IVPB 1000 mg/200 mL premix  Status:  Discontinued        1,000 mg 200 mL/hr over 60 Minutes Intravenous Every 8 hours 11/27/12 1333 11/29/12 1510   11/27/12 1400   micafungin (MYCAMINE) 150 mg in sodium chloride 0.9 % 100 mL IVPB  Status:  Discontinued        150 mg 100 mL/hr over 1 Hours Intravenous Daily 11/27/12 1251 12/01/12 1131   11/27/12 1400   azithromycin (ZITHROMAX) tablet 500 mg  Status:  Discontinued        500 mg Oral Daily 11/27/12 1252 11/27/12 1724   11/27/12 1400   imipenem-cilastatin (PRIMAXIN) 250 mg in sodium chloride 0.9 % 100 mL IVPB        250 mg 200 mL/hr over 30 Minutes Intravenous 4 times per day 11/27/12 1333 12/03/12 2359   11/27/12 1000   fluconazole (DIFLUCAN) tablet 100 mg  Status:  Discontinued        100 mg Oral Daily 11/26/12 2048 11/27/12 1248   11/27/12 1000    rifampin (RIFADIN) capsule 600 mg        600 mg Oral Daily 11/26/12 2048     11/27/12 0700   vancomycin (VANCOCIN) IVPB 1000 mg/200 mL premix  Status:  Discontinued        1,000 mg 200 mL/hr over 60 Minutes Intravenous Every 8 hours 11/26/12 2042 11/27/12 1248   11/26/12 2200   clarithromycin (BIAXIN) tablet 1,000 mg  Status:  Discontinued        1,000 mg Oral Daily with supper 11/26/12 2048 11/27/12 1248   11/26/12 2200   ethambutol (MYAMBUTOL) tablet 800 mg        800 mg Oral Daily 11/26/12 2048     11/26/12 2200   ceFEPIme (MAXIPIME) 1 g in dextrose 5 % 50 mL IVPB  Status:  Discontinued        1 g 100 mL/hr over 30 Minutes Intravenous 3 times per day 11/26/12 2042 11/27/12 1248   11/26/12 2030   vancomycin (VANCOCIN) IVPB 1000 mg/200 mL premix        1,000 mg 200 mL/hr over 60 Minutes Intravenous  Once 11/26/12 2028 11/27/12  1610   11/26/12 1830   cefTRIAXone (ROCEPHIN) 1 g in dextrose 5 % 50 mL IVPB  Status:  Discontinued        1 g 100 mL/hr over 30 Minutes Intravenous Every 24 hours 11/26/12 1816 11/26/12 2048          Medications:  Scheduled:    . antiseptic oral rinse  15 mL Mouth Rinse BID  . enoxaparin (LOVENOX) injection  40 mg Subcutaneous Q24H  . ethambutol  800 mg Oral Daily  . feeding supplement  1 Container Oral TID BM  . gabapentin  200 mg Oral BID  . imipenem-cilastatin  250 mg Intravenous Q6H  . isoniazid  300 mg Oral Daily  . pantoprazole  40 mg Oral BID AC  . vitamin B-6  50 mg Oral Daily  . rifampin  600 mg Oral Daily  . saccharomyces boulardii  250 mg Oral BID  . sodium chloride  3 mL Intravenous Q12H  . sucralfate  1 g Oral TID WC & HS  . vancomycin  1,000 mg Intravenous Q8H    Objective: Vital signs in last 24 hours: Temp:  [98.2 F (36.8 C)-100 F (37.8 C)] 98.7 F (37.1 C) (12/30 0434) Pulse Rate:  [78-88] 88  (12/30 0434) Resp:  [18-20] 20  (12/30 0434) BP: (96-107)/(63-65) 107/64 mmHg (12/30 0434) SpO2:  [97 %-99 %] 97 % (12/30  0434)   General appearance: alert, cooperative and no distress Resp: rhonchi bilaterally Cardio: regular rate and rhythm GI: normal findings: bowel sounds normal and soft, non-tender  Lab Results  Basename 12/03/12 0500 12/01/12 0516  WBC 8.3 4.4  HGB 10.5* 9.5*  HCT 33.0* 30.1*  NA 133* 135  K 3.2* 3.9  CL 97 99  CO2 25 25  BUN 6 5*  CREATININE 0.51 0.52  GLU -- --    Microbiology: Recent Results (from the past 240 hour(s))  URINE CULTURE     Status: Normal   Collection Time   11/26/12  6:13 PM      Component Value Range Status Comment   Specimen Description URINE, CLEAN CATCH   Final    Special Requests NONE   Final    Culture  Setup Time 11/26/2012 20:02   Final    Colony Count NO GROWTH   Final    Culture NO GROWTH   Final    Report Status 11/27/2012 FINAL   Final   CLOSTRIDIUM DIFFICILE BY PCR     Status: Normal   Collection Time   11/26/12 10:32 PM      Component Value Range Status Comment   C difficile by pcr NEGATIVE  NEGATIVE Final   CULTURE, EXPECTORATED SPUTUM-ASSESSMENT     Status: Normal   Collection Time   11/27/12 11:47 PM      Component Value Range Status Comment   Specimen Description SPUTUM   Final    Special Requests NONE   Final    Sputum evaluation     Final    Value: MICROSCOPIC FINDINGS SUGGEST THAT THIS SPECIMEN IS NOT REPRESENTATIVE OF LOWER RESPIRATORY SECRETIONS. PLEASE RECOLLECT.     CALLED TO RN S.COLUMBRES AT 0148 11/28/12 LGP   Report Status 11/28/2012 FINAL   Final   CULTURE, BLOOD (ROUTINE X 2)     Status: Normal (Preliminary result)   Collection Time   11/30/12  4:20 PM      Component Value Range Status Comment   Specimen Description BLOOD LEFT ARM   Final  Special Requests BOTTLES DRAWN AEROBIC AND ANAEROBIC 10CC   Final    Culture  Setup Time 11/30/2012 20:40   Final    Culture     Final    Value:        BLOOD CULTURE RECEIVED NO GROWTH TO DATE CULTURE WILL BE HELD FOR 5 DAYS BEFORE ISSUING A FINAL NEGATIVE REPORT    Report Status PENDING   Incomplete   CULTURE, BLOOD (ROUTINE X 2)     Status: Normal (Preliminary result)   Collection Time   11/30/12  4:25 PM      Component Value Range Status Comment   Specimen Description BLOOD LEFT HAND   Final    Special Requests BOTTLES DRAWN AEROBIC AND ANAEROBIC 10CC   Final    Culture  Setup Time 11/30/2012 20:40   Final    Culture     Final    Value:        BLOOD CULTURE RECEIVED NO GROWTH TO DATE CULTURE WILL BE HELD FOR 5 DAYS BEFORE ISSUING A FINAL NEGATIVE REPORT   Report Status PENDING   Incomplete     Studies/Results: No results found.   Assessment/Plan: Non-tuberculous Mycobacteria. (NTM)  ? HCAP  Esophagitis, post-operative mediastinal changes  Loculated areas R Lung, R hydropneumothorax   MAC pulmonary disease s/p lobectomy: continue with 4 drug regimen of INH/Rifampin/ETH/Azithro. Now day #11  emperic regimen for secondary bacterial infection = would stop imipenem and vancomycin today since she has finished a 7 day course, unclear need to extend them. If she has on going fevers, would have low threshold to repeat CT scan of the chest to re-eval her fluid collections   Esophagitis = getting EGD today by [facility]  Michaella Imai [facility] [phone] 12/03/2012, 2:08 PM   LOS: 7 days

## 2012-12-03 NOTE — Progress Notes (Signed)
  Subjective:  Feels better except for chest wall pain, which has improved with neuronin.  Objective: Vital signs in last 24 hours: Temp:  [98.2 F (36.8 C)-99.8 F (37.7 C)] 99.8 F (37.7 C) (12/30 1510) Pulse Rate:  [83-92] 91  (12/30 1510) Cardiac Rhythm:  [-] Normal sinus rhythm (12/30 0700) Resp:  [9-35] 12  (12/30 1726) BP: (89-119)/(46-95) 89/50 mmHg (12/30 1726) SpO2:  [95 %-100 %] 95 % (12/30 1726)  Hemodynamic parameters for last 24 hours:    Intake/Output from previous day: 12/29 0701 - 12/30 0700 In: 855.8 [P.O.:360; I.V.:93.8; IV Piggyback:402] Out: 1700 [Urine:1700] Intake/Output this shift:    General appearance: alert and cooperative Heart: regular rate and rhythm, S1, S2 normal, no murmur, click, rub or gallop Lungs: clear to auscultation bilaterally Abdomen: soft, non-tender; bowel sounds normal; no masses,  no organomegaly Wound: incision healing well  Lab Results:  Basename 12/03/12 0500 12/01/12 0516  WBC 8.3 4.4  HGB 10.5* 9.5*  HCT 33.0* 30.1*  PLT 453* 421*   BMET:  Basename 12/03/12 0500 12/01/12 0516  NA 133* 135  K 3.2* 3.9  CL 97 99  CO2 25 25  GLUCOSE 192* 106*  BUN 6 5*  CREATININE 0.51 0.52  CALCIUM 8.7 8.5    PT/INR: No results found for this basename: LABPROT,INR in the last 72 hours ABG    Component Value Date/Time   PHART 7.395 11/06/2012 0410   HCO3 25.0* 11/06/2012 0410   TCO2 26 11/06/2012 0410   ACIDBASEDEF 1.2 11/05/2012 0621   O2SAT 94.0 11/06/2012 0410   CBG (last 3)  No results found for this basename: GLUCAP:3 in the last 72 hours  CXR: lungs are fairly clear. There is a small right apical space which is not unexpected after upper lobectomy. That will gradually resolve. There is no air/fluid level and no sign of abscess.  Assessment/Plan:  Her hx since admission was reviewed. I just found out this afternoon that she was here. She looks much better overall than when she went home initially after surgery. Her  lungs are now clear on exam. The etiology of her fever is still not clear to me but she did start having spiking fever a week postop that occurred every afternoon with essentially normal wbc ct. She is now afebrile with normal wbc ct after a 7 day course of Primaxin. Her diarrhea is about resolved and C. Diff. PCR was negative.  She said she may go home tomorrow if no fever and I will see her back in 2 weeks. Thanks for taking good care of this nice lady.    LOS: 7 days    Laiyah Exline K 12/03/2012

## 2012-12-03 NOTE — Progress Notes (Signed)
[facility] PROGRESS NOTE  [name] [name] FAO:[id number] DOB: 08-11-1956 DOA: 11/26/2012 PCP: Willow Ora, MD  Assessment/Plan: *Pleuritic chest pain  - CTA of the chest negative for pulmonary embolism  - CT of the chest showing a lot of postoperative changes, but also showing a soft tissue density in the posterior mediastinum. Seen by Dr Morton Peters from cardiothoracic surgery,does not think soft tissue density is of any significance  -Per Dr Hebert Soho is likely 2/2 post thoractomy pain-suggested Ultram and Neurontin. This is clearly improving   Fever with Leukocytosis  -?etiology  -fever in the last 24 hours.  -Leukocytosis has resolved  -appreciate ID input  -antibiotics being adjusted by infectious diseases physicians.  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 Luciana Axe- who presumed that this is a MAC infection (rather than mycobacterium TB- as PPD and Quantiferon were negative)- patient as a result was started on rifampin, ethambutol and Biaxin.   Esophagitis  - No oral thrush-but at risk for candidal esophagitis  - On Empiric Micafungin  - Change PPI to twice a day  -appreciate GI input- EGD today?  Diarrhea  - Stool C. difficile PCR negative  - Is apparently has been going on for the past few weeks-but better  - No abdominal pain  - Trial of probiotics   Presumed MAC (mycobacterium avium-intracellulare complex)  -Antibiotics managed by ID.   Anemia  - Suspect this is chronic-from chronic disease  - Monitor H&H periodically  - Anemia panel if hemoglobin drops further-but is stable   Hypokalemia  -replete IV as patient is NPO    GERD  - Continue with PPI- but change to twice a day given esophageal thickening seen on the CT   Code Status: full Family  Communication:  Disposition Plan: home on Tuesday?   Consultants:  ID  Procedures:    Antibiotics:    HPI/Subjective: C/o "fosamax" type pain in esophagus after eating No fevers  Objective: Filed Vitals:   12/02/12 1435 12/02/12 1703 12/02/12 2027 12/03/12 0434  BP: 104/63 96/65 104/63 107/64  Pulse: 87 78 83 88  Temp: 99 F (37.2 C) 98.2 F (36.8 C) 98.2 F (36.8 C) 98.7 F (37.1 C)  TempSrc: Oral Oral Oral Oral  Resp: 18 18 20 20   Height:      Weight:      SpO2: 97% 99% 97% 97%    Intake/Output Summary (Last 24 hours) at 12/03/12 0954 Last data filed at 12/03/12 0424  Gross per 24 hour  Intake 735.83 ml  Output   1350 ml  Net -614.17 ml   Filed Weights   11/26/12 2020 11/26/12 2121  Weight: 49.896 kg (110 lb) 50.8 kg (111 lb 15.9 oz)    Exam:  Gen Exam: Awake and alert with clear speech.  Neck: Supple, No JVD.  Chest: B/L Clear.  CVS: S1 S2 Regular, no murmurs.  Abdomen: soft, BS +, non tender, non distended.  Extremities: no edema, lower extremities warm to touch.No swelling or redness noted in any of her fingers-now able to make a fist in both hands  Neurologic: Non Focal.  Skin: No Rash.    Data Reviewed: Basic Metabolic Panel:  Lab 12/03/12 6962 12/01/12 0516 11/30/12 0650 11/29/12 0715 11/28/12 0644  NA 133* 135 138 140 135  K 3.2* 3.9 3.1* 3.0* 3.0*  CL 97 99 102 103 97  CO2 25 25 24 25 25   GLUCOSE 192* 106* 102* 119* 118*  BUN 6 5* 4* 4* 4*  CREATININE 0.51 0.52 0.43* 0.42* 0.45*  CALCIUM 8.7 8.5 8.6 8.4 8.8  MG -- -- -- -- --  PHOS -- -- -- -- --   Liver Function Tests:  Lab 11/28/12 0644 11/26/12 1631  AST 10 18  ALT 8 12  ALKPHOS 163* 167*  BILITOT 0.4 0.5  PROT 6.4 7.1  ALBUMIN 2.0* 2.5*   No results found for this basename: LIPASE:5,AMYLASE:5 in the last 168 hours No results found for this basename: AMMONIA:5 in the last 168 hours CBC:  Lab 12/03/12 0500 12/01/12 0516 11/30/12 0650 11/29/12 0715 11/28/12 0644  11/26/12 1631  WBC 8.3 4.4 7.4 10.1 16.7* --  NEUTROABS -- -- -- -- -- 13.0*  HGB 10.5* 9.5* 9.9* 9.2* 9.7* --  HCT 33.0* 30.1* 29.8* 28.2* 29.3* --  MCV 95.7 95.0 94.9 95.6 94.8 --  PLT 453* 421* 446* 380 423* --   Cardiac Enzymes:  Lab 11/27/12 1200  CKTOTAL --  CKMB --  CKMBINDEX --  TROPONINI <0.30   BNP (last 3 results) No results found for this basename: PROBNP:3 in the last 8760 hours CBG: No results found for this basename: GLUCAP:5 in the last 168 hours  Recent Results (from the past 240 hour(s))  URINE CULTURE     Status: Normal   Collection Time   11/26/12  6:13 PM      Component Value Range Status Comment   Specimen Description URINE, CLEAN CATCH   Final    Special Requests NONE   Final    Culture  Setup Time 11/26/2012 20:02   Final    Colony Count NO GROWTH   Final    Culture NO GROWTH   Final    Report Status 11/27/2012 FINAL   Final   CLOSTRIDIUM DIFFICILE BY PCR     Status: Normal   Collection Time   11/26/12 10:32 PM      Component Value Range Status Comment   C difficile by pcr NEGATIVE  NEGATIVE Final   CULTURE, EXPECTORATED SPUTUM-ASSESSMENT     Status: Normal   Collection Time   11/27/12 11:47 PM      Component Value Range Status Comment   Specimen Description SPUTUM   Final    Special Requests NONE   Final    Sputum evaluation     Final    Value: MICROSCOPIC FINDINGS SUGGEST THAT THIS SPECIMEN IS NOT REPRESENTATIVE OF LOWER RESPIRATORY SECRETIONS. PLEASE RECOLLECT.     CALLED TO RN S.COLUMBRES AT 0148 11/28/12 LGP   Report Status 11/28/2012 FINAL   Final   CULTURE, BLOOD (ROUTINE X 2)     Status: Normal (Preliminary result)   Collection Time   11/30/12  4:20 PM      Component Value Range Status Comment   Specimen Description BLOOD LEFT ARM   Final    Special Requests BOTTLES DRAWN AEROBIC AND ANAEROBIC 10CC   Final    Culture  Setup Time 11/30/2012 20:40   Final    Culture     Final    Value:        BLOOD CULTURE RECEIVED NO GROWTH TO DATE  CULTURE WILL BE HELD FOR 5 DAYS BEFORE ISSUING A FINAL NEGATIVE REPORT   Report Status PENDING   Incomplete   CULTURE, BLOOD (ROUTINE X 2)  Status: Normal (Preliminary result)   Collection Time   11/30/12  4:25 PM      Component Value Range Status Comment   Specimen Description BLOOD LEFT HAND   Final    Special Requests BOTTLES DRAWN AEROBIC AND ANAEROBIC 10CC   Final    Culture  Setup Time 11/30/2012 20:40   Final    Culture     Final    Value:        BLOOD CULTURE RECEIVED NO GROWTH TO DATE CULTURE WILL BE HELD FOR 5 DAYS BEFORE ISSUING A FINAL NEGATIVE REPORT   Report Status PENDING   Incomplete      Studies: No results found.  Scheduled Meds:    . antiseptic oral rinse  15 mL Mouth Rinse BID  . enoxaparin (LOVENOX) injection  40 mg Subcutaneous Q24H  . ethambutol  800 mg Oral Daily  . feeding supplement  1 Container Oral TID BM  . gabapentin  200 mg Oral BID  . imipenem-cilastatin  250 mg Intravenous Q6H  . isoniazid  300 mg Oral Daily  . pantoprazole  40 mg Oral BID AC  . potassium chloride  10 mEq Intravenous Q1 Hr x 3  . vitamin B-6  50 mg Oral Daily  . rifampin  600 mg Oral Daily  . saccharomyces boulardii  250 mg Oral BID  . sodium chloride  3 mL Intravenous Q12H  . sucralfate  1 g Oral TID WC & HS  . vancomycin  1,000 mg Intravenous Q8H   Continuous Infusions:    . sodium chloride 10 mL/hr at 12/02/12 1901    Principal Problem:  *Pleuritic chest pain Active Problems:  Fever  MAC (mycobacterium avium-intracellulare complex)  Diarrhea  Anemia  Odynophagia    Time spent: 12    [facility], [name] [name]  [facility] Pager [phone] If 8PM-8AM, please contact night-coverage at [email or web address], password [facility] 12/03/2012, 9:54 AM  LOS: 7 days

## 2012-12-03 NOTE — Op Note (Signed)
Reviewed GI records  In 2000 in [place] had Colonoscopy for eval of left abd pain that showed resolution of left sided ischemic colitis.  In 04/2008 Dr Marva Panda in [place] did colonoscopy for eval of left abd pain..  Found diverticulosis in ascending, descending and rectosigmoid.  Polyp removed from mid ascending colon. Biopsy:  Tubular adenoma, no HGD.  Polyps rmoved from mid sigmoid:  Hyperplastic.   Non bleeding internal hemorrhoids.     Jennye Moccasin  PA-C

## 2012-12-03 NOTE — Op Note (Signed)
[facility] [address] [place], [address]   ENDOSCOPY PROCEDURE REPORT  PATIENT: [name], [name]  MR#: [id number] BIRTHDATE: Dec 09, 1955 , 56  yrs. old GENDER: Female ENDOSCOPIST: Hart Carwin, MD REFERRED BY:  Dr Benjamine Mola, Dr Drue Novel PROCEDURE DATE:  12/03/2012 PROCEDURE:  EGD, diagnostic ASA CLASS:     Class II INDICATIONS:  odynophagia/dysphagia, hx of GERD,last EGD  in 1994, hx of ischemic colitis MEDICATIONS: These medications were titrated to patient response per physician's verbal order and Versed-Detailed 6 mg IV  ,Fentanyl 60 mg IV, TOPICAL ANESTHETIC: Cetacaine Spray  DESCRIPTION OF PROCEDURE: After the risks benefits and alternatives of the procedure were thoroughly explained, informed consent was obtained.  The EG-[id number] ([id number]) endoscope was introduced through the mouth and advanced to the second portion of the duodenum. Without limitations.  The instrument was slowly withdrawn as the mucosa was fully examined.      The upper, middle and distal third of the esophagus were carefully inspected and no abnormalities were noted.  The z-line was well seen at the GEJ.  The endoscope was pushed into the fundus which was normal including a retroflexed view.  The antrum, gastric body, first and second part of the duodenum were unremarkable. Retroflexed views revealed no abnormalities.     The scope was then withdrawn from the patient and the procedure completed.  COMPLICATIONS: There were no complications. ENDOSCOPIC IMPRESSION: Normal EGD , no evidence of esophagitis, no stricture 1cm tiny hiatal hernia  RECOMMENDATIONS: Barium esophagram to assess  es. spasm PPI antireflux measures  REPEAT EXAM: no  eSigned:  Hart Carwin, MD 12/03/2012 5:24 PM   CC:  PATIENT NAME:  [name], [name] MR#: [id number]

## 2012-12-03 NOTE — Progress Notes (Signed)
Pt. Given care notes for upper endoscopy.  Forbes Cellar, RN

## 2012-12-03 NOTE — Progress Notes (Signed)
     [facility] Daily Rounding Note 12/03/2012, 11:28 AM  SUBJECTIVE:       Having pain , tightness in lower chest with po's.  Similar to what she had prior to lung surgery.   OBJECTIVE:         Vital signs in last 24 hours:    Temp:  [98.2 F (36.8 C)-100 F (37.8 C)] 98.7 F (37.1 C) (12/30 0434) Pulse Rate:  [78-88] 88  (12/30 0434) Resp:  [18-20] 20  (12/30 0434) BP: (96-107)/(63-65) 107/64 mmHg (12/30 0434) SpO2:  [97 %-99 %] 97 % (12/30 0434) Last BM Date: 11/30/12 General: pale, thin, looks unwell but not toxic   Heart: RRR Chest: clear  Abdomen: soft, NT, ND,  No mass.  Active BS  Extremities: no pedal edema Neuro/Psych:  Pleasant, no confusion or tremor.   Intake/Output from previous day: 12/29 0701 - 12/30 0700 In: 855.8 [P.O.:360; I.V.:93.8; IV Piggyback:402] Out: 1700 [Urine:1700]  Intake/Output this shift:    Lab Results:  Basename 12/03/12 0500 12/01/12 0516  WBC 8.3 4.4  HGB 10.5* 9.5*  HCT 33.0* 30.1*  PLT 453* 421*   BMET  Basename 12/03/12 0500 12/01/12 0516  NA 133* 135  K 3.2* 3.9  CL 97 99  CO2 25 25  GLUCOSE 192* 106*  BUN 6 5*  CREATININE 0.51 0.52  CALCIUM 8.7 8.5   ASSESMENT: *  Odynophagia, persistent and intermittent chronic. Has persisted despite antifungal rx. 11/27/11 CT chest shows diffuse esophagitis. Has not had esophagram.  *  Normocytic anemia *  S/P 11/05/12 thoracotomy, RUL lobectomy with granulomas and mycobacterium on culture (presumed MAC is being treated) *  DM ?  Note glucose of 192.   PLAN: *  EGD this afternoon.  D/w pt who is agreeable to proceed. Pt did receive her q 24 hour Lovenox at 2100 yesterday.    LOS: 7 days   Jennye Moccasin  12/03/2012, 11:28 AM Pager: [phone]   .I have reviewed the above note, examined the patient and agree with plan of treatment. Esophagitis vs esophageal dis motility. Will proceed with EGD, brushings if indicated.  [facility] Pager # [phone]

## 2012-12-04 ENCOUNTER — Encounter: Payer: 59 | Admitting: Surgery

## 2012-12-04 ENCOUNTER — Encounter (HOSPITAL_COMMUNITY): Payer: Self-pay | Admitting: Internal Medicine

## 2012-12-04 ENCOUNTER — Inpatient Hospital Stay (HOSPITAL_COMMUNITY): Payer: 59

## 2012-12-04 DIAGNOSIS — J449 Chronic obstructive pulmonary disease, unspecified: Secondary | ICD-10-CM

## 2012-12-04 LAB — CBC
HCT: 31.7 % — ABNORMAL LOW (ref 36.0–46.0)
Hemoglobin: 10.5 g/dL — ABNORMAL LOW (ref 12.0–15.0)
MCH: 31.2 pg (ref 26.0–34.0)
MCHC: 33.1 g/dL (ref 30.0–36.0)
MCV: 94.1 fL (ref 78.0–100.0)
Platelets: 471 K/uL — ABNORMAL HIGH (ref 150–400)
RBC: 3.37 MIL/uL — ABNORMAL LOW (ref 3.87–5.11)
RDW: 14.5 % (ref 11.5–15.5)
WBC: 11 K/uL — ABNORMAL HIGH (ref 4.0–10.5)

## 2012-12-04 LAB — BASIC METABOLIC PANEL
BUN: 7 mg/dL (ref 6–23)
Calcium: 9 mg/dL (ref 8.4–10.5)
Creatinine, Ser: 0.47 mg/dL — ABNORMAL LOW (ref 0.50–1.10)
GFR calc Af Amer: >90 mL/min (ref >90)
GFR calc non Af Amer: >90 mL/min (ref >90)
Glucose, Bld: 96 mg/dL (ref 70–99)

## 2012-12-04 MED ORDER — PANTOPRAZOLE SODIUM 40 MG PO TBEC: 40.0000 mg | DELAYED_RELEASE_TABLET | Freq: Two times a day (BID) | ORAL | Status: DC

## 2012-12-04 MED ORDER — OXYCODONE HCL 10 MG PO TABS: 10.0000 mg | ORAL_TABLET | ORAL | Status: DC | PRN

## 2012-12-04 MED ORDER — BOOST / RESOURCE BREEZE PO LIQD: 1.0000 | Freq: Three times a day (TID) | ORAL | Status: DC

## 2012-12-04 MED ORDER — GABAPENTIN 100 MG PO CAPS: 200.0000 mg | ORAL_CAPSULE | Freq: Two times a day (BID) | ORAL | Status: DC

## 2012-12-04 MED ORDER — SACCHAROMYCES BOULARDII 250 MG PO CAPS: 250.0000 mg | ORAL_CAPSULE | Freq: Two times a day (BID) | ORAL | Status: DC

## 2012-12-04 MED ORDER — ISONIAZID 300 MG PO TABS: 300.0000 mg | ORAL_TABLET | Freq: Every day | ORAL | Status: DC

## 2012-12-04 MED ORDER — AZITHROMYCIN 600 MG PO TABS: 600.0000 mg | ORAL_TABLET | Freq: Every day | ORAL | Status: DC

## 2012-12-04 NOTE — Progress Notes (Signed)
Patient discharge teaching given, including activity, diet, follow-up appoints, and medications. Patient verbalized understanding of all discharge instructions. IV access was d/c'd. Vitals are stable. Skin is intact except as charted in most recent assessments. Pt to be escorted out by NT, to be driven home by family. 

## 2012-12-04 NOTE — Progress Notes (Signed)
      Gi Daily Rounding Note 12/04/2012, 9:17 AM  SUBJECTIVE:       Chest wall pain is better after Neurontin added. This is different than the discomfort she gets after swallowing solids.  The odyno/dysphagia persists, she does not have it with liquids, just solids.  Esophagram completed, report pending. Pt was told that reflux was visible but no stricture.   OBJECTIVE:         Vital signs in last 24 hours:    Temp:  [99.3 F (37.4 C)-99.8 F (37.7 C)] 99.3 F (37.4 C) (12/31 0500) Pulse Rate:  [90-95] 90  (12/31 0500) Resp:  [9-35] 20  (12/31 0500) BP: (89-119)/(46-95) 96/62 mmHg (12/31 0500) SpO2:  [95 %-100 %] 97 % (12/31 0500) Last BM Date: 12/03/12 General: looks somewhat frail, old for age   Heart: RRR Chest: clear B.  No resp distress Abdomen: soft, NT, ND.  BS active.   Extremities: no pedal edema Neuro/Psych:  Relaxed, amiable, walking slowly in hall.   Intake/Output from previous day: 12/30 0701 - 12/31 0700 In: 446 [I.V.:346; IV Piggyback:100] Out: 1 [Urine:1]  Intake/Output this shift:    Lab Results:  Basename 12/04/12 0557 12/03/12 0500  WBC 11.0* 8.3  HGB 10.5* 10.5*  HCT 31.7* 33.0*  PLT 471* 453*   BMET  Basename 12/04/12 0557 12/03/12 0500  NA 135 133*  K 4.3 3.2*  CL 98 97  CO2 25 25  GLUCOSE 96 192*  BUN 7 6  CREATININE 0.47* 0.51  CALCIUM 9.0 8.7    ASSESMENT: *  Acute on chronic dysphagia/odynophagia. EGD 12/03/12: normal study. With exception of miniscule 1 cm HH. Ie:  No explanation for pt complaint.    PLAN: *  Barium esophagram:  Await report *  Continue daily Protonix.  Not sure that Carafate will add any benefit, given lack of mucosal injury on EGD.  *  Pt can veer diet towards softer foods and liquids, take Breeze or other nutritional supplements. * plan per Dr Benjamine Mola is for discharge today.    LOS: 8 days   Jennye Moccasin  12/04/2012, 9:17 AM Pager: [phone]  I have reviewed the above note and agree with plan of  treatment.Barium esophagram shows normal peristalsis, small hiatal hernia with reflux.Nothing to explain the chest pain. Will be happy to see as an outpatient.  [facility] Pager # [phone]

## 2012-12-04 NOTE — Discharge Summary (Signed)
Physician Discharge Summary  [name] [name] ZOX:[id number] DOB: Apr 28, 1956 DOA: 11/26/2012  PCP: Willow Ora, MD  Admit date: 11/26/2012 Discharge date: 12/04/2012  Time spent: 35 minutes  Recommendations for Outpatient Follow-up:  1. CBC, BMP in 1 week 2. Follow up in ID clinic  Discharge Diagnoses:  Principal Problem:  *Pleuritic chest pain Active Problems:  Fever  MAC (mycobacterium avium-intracellulare complex)  Diarrhea  Anemia  Odynophagia  Other dysphagia   Discharge Condition: improved  Diet recommendation: DYS 3  Filed Weights   11/26/12 2020 11/26/12 2121  Weight: 49.896 kg (110 lb) 50.8 kg (111 lb 15.9 oz)    History of present illness:  56 year old woman past medical history presumed MAC started on antibiotics 12/16 with recent thoracotomy and upper lobectomy earlier December presented to the emergency department with complaints of fever, subacute area, pleuritic chest pain, shortness of breath and was referred for admission.  Patient with history of right upper lobe cavitary lung mass. Status post thoracotomy with right upper lobectomy earlier this month. Postop course complicated by lung collapse and airspace disease, treated with vancomycin and Zosyn. Pathology was negative. Cultures revealed acid-fast bacillus and patient was seen by infectious disease, diagnosed with presumed MAC and started on empiric therapy. By review of Dr. Ephriam Knuckles note, it was noted that if she worsened amikacin should be considered.  Patient reports ongoing diarrhea since before discharge earlier this month. It has perhaps worsened and she currently has 5+ stools per day. However, what caused her to come to the emergency department today was sudden onset of fever at home this morning as well as pleuritic chest pain with associated shortness of breath, separate from her post surgery pain. She has no history of coronary artery disease. No history of dysuria, frequency. No aggravating or  alleviating factors noted. Pleuritic chest pain is located in the center of her chest with radiation to the back. She denies leg swelling or leg pain. COPD is listed but she denies using inhalers or oxygen at home.  In the ED temperature 101, respiratory rate 30, minimal tachycardia, normal blood pressure and oxygenation. Blood cell count 14.6. Chest x-ray showed improvement. EKG, sinus tachycardia no acute changes.      Hospital Course:  *Pleuritic chest pain  - CTA of the chest negative for pulmonary embolism  - CT of the chest showing a lot of postoperative changes, but also showing a soft tissue density in the posterior mediastinum. Seen by Dr Morton Peters from cardiothoracic surgery,does not think soft tissue density is of any significance  -Per Dr Hebert Soho is likely 2/2 post thoractomy pain-suggested Ultram and Neurontin. This is clearly improving   Fever with Leukocytosis  -?etiology  -no fever in the last 24 hours.  -Leukocytosis has resolved  -appreciate ID input  -antibiotics being adjusted by infectious diseases physicians.  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 Luciana Axe- who presumed that this is a MAC infection (rather than mycobacterium TB- as PPD and Quantiferon were negative)- patient as a result was started on rifampin, ethambutol and Biaxin.   Esophagitis  - No oral thrush-but at risk for candidal esophagitis  - - Change PPI to twice a day  -EGD was normal, barium swallow done with acid reflux showing   Diarrhea  - Stool C. difficile  PCR negative  - probiotics   Presumed MAC (mycobacterium avium-intracellulare complex)  -Antibiotics managed by ID-will d/c on all 4 oral abx to follow up in ID clinic .  Anemia  - Suspect this is chronic-from chronic disease  -  Monitor H&H periodically  - Anemia panel if hemoglobin drops further-but is stable   Hypokalemia  -repleted   GERD  - Continue with PPI- but change to twice a day given esophageal thickening seen on the CT   Procedures:  EGD: normal  Consultations:  ID  GI  Discharge Exam: Filed Vitals:   12/03/12 1721 12/03/12 1726 12/03/12 2100 12/04/12 0500  BP: 99/48 89/50 102/59 96/62  Pulse:   95 90  Temp:   99.4 F (37.4 C) 99.3 F (37.4 C)  TempSrc:   Oral Oral  Resp: 15 12 16 20   Height:      Weight:      SpO2: 96% 95% 97% 97%    General: A+Ox3, NAD Cardiovascular: rrr Respiratory: clear  Discharge Instructions      Discharge Orders    Future Appointments: Provider: Department: Dept Phone: Center:   12/06/2012 10:00 AM Gardiner Barefoot, MD [facility] [phone] [facility]     Future Orders Please Complete By Expires   Increase activity slowly      Discharge instructions      Comments:   CBC. BMP in 1 week Follow up in ID clinic DYS 3 diet   Call MD for:  temperature >100.4      Call MD for:  difficulty breathing, headache or visual disturbances          Medication List     As of 12/04/2012 10:00 AM    STOP taking these medications         clarithromycin 500 MG tablet   Commonly known as: BIAXIN      fluconazole 100 MG tablet   Commonly known as: DIFLUCAN      nicotine 10 MG inhaler   Commonly known as: NICOTROL      NONFORMULARY OR COMPOUNDED ITEM      omeprazole 20 MG capsule   Commonly known as: PRILOSEC      TAKE these medications         acetaminophen 500 MG tablet   Commonly known as: TYLENOL   Take 500 mg by mouth once as needed. For fever      ALPRAZolam 0.5 MG tablet   Commonly known as: XANAX   Take 0.25-0.5 mg by mouth at bedtime as needed. For insomnia.      azithromycin 600 MG tablet   Commonly known as: ZITHROMAX   Take 1 tablet (600 mg total) by mouth daily.      ethambutol 400 MG tablet    Commonly known as: MYAMBUTOL   Take 800 mg by mouth daily.      feeding supplement Liqd   Take 1 Container by mouth 3 (three) times daily between meals.      ferrous gluconate 324 MG tablet   Commonly known as: FERGON   Take 1 tablet (324 mg total) by mouth daily with breakfast.      gabapentin 100 MG capsule   Commonly known as: NEURONTIN   Take 2 capsules (200 mg total) by mouth 2 (two) times daily.      isoniazid 300 MG tablet   Commonly known as: NYDRAZID   Take 1 tablet (300 mg total) by mouth daily.  NICORETTE 4 MG lozenge   Generic drug: nicotine polacrilex   Place 4 mg inside cheek every 3 (three) hours as needed. For nicotine cravings.      Oxycodone HCl 10 MG Tabs   Take 1 tablet (10 mg total) by mouth every 4 (four) hours as needed.      pantoprazole 40 MG tablet   Commonly known as: PROTONIX   Take 1 tablet (40 mg total) by mouth 2 (two) times daily before a meal.      rifampin 300 MG capsule   Commonly known as: RIFADIN   Take 600 mg by mouth daily.      saccharomyces boulardii 250 MG capsule   Commonly known as: FLORASTOR   Take 1 capsule (250 mg total) by mouth 2 (two) times daily.      traMADol 50 MG tablet   Commonly known as: ULTRAM   Take 50-100 mg by mouth every 6 (six) hours as needed.        Follow-up Information    Follow up with Willow Ora, MD. In 1 week. (CBC, BMP)    Contact information:   [address] [facility] [address] [place] [address] [phone]       Follow up with Staci Righter, MD. In 2 weeks.   Contact information:   [address] [place] [address] [phone]       Follow up with Alleen Borne, MD. (as previously scheduled)    Contact information:   [address] [place] [address] [phone]           The results of significant diagnostics from this hospitalization (including imaging, microbiology, ancillary and laboratory) are listed below for reference.     Significant Diagnostic Studies: Dg Chest 2 View  11/30/2012  *RADIOLOGY REPORT*  Clinical Data: Fever.  Chest pain.  Weakness.  CHEST - 2 VIEW  Comparison: 11/26/2012  Findings: Emphysema noted with scarring along the right apical wedge resections.  Chronic lucency at the right lung apex previously characterized as a loculated hydropneumothorax, slightly larger than on prior radiographs although this may be partially projectional.  There is mild elevation right hemidiaphragm along with old right rib deformities.  IMPRESSION:  1.  There is slight increase in size of the right apical hydropneumothorax. 2.  Emphysema. 3.  Stable scarring along the right upper lung wedge resection sites.   Original Report Authenticated By: Gaylyn Rong, M.D.    Dg Chest 2 View  11/26/2012  *RADIOLOGY REPORT*  Clinical Data: Fever  CHEST - 2 VIEW  Comparison: 09/2012  Findings: Interstitial prominence.  Bronchitic changes.  Increased AP diameter of the chest.  Mild cardiomegaly.  Normal pulmonary vascularity.  Loculated right apical hydropneumothorax has improved.  Heterogeneous opacities throughout the right mid and lower lung zones have improved.  Minimal residual subsegmental atelectasis at the left base.  IMPRESSION: Improved loculated right hydropneumothorax.  Improved heterogeneous pulmonary opacities in the right mid and lower lung zones.   Original Report Authenticated By: Jolaine Click, M.D.    Dg Chest 2 View  11/16/2012  *RADIOLOGY REPORT*  Clinical Data: Pneumonia  CHEST - 2 VIEW  Comparison: Yesterday  Findings: Right chest tube has been removed.  Stable right hydropneumothorax.  Opacities within the right lower lung zone and right suprahilar density are stable.  Interstitial prominence throughout the left lung with hyperaeration is stable.  No left pneumothorax.  Normal heart size.  IMPRESSION: Stable right hydropneumothorax after chest tube  removal.   Original Report Authenticated By: Jolaine Click, M.D.     Dg Chest 2 View  11/15/2012  *RADIOLOGY REPORT*  Clinical Data: Shortness of breath, follow up pneumonia  CHEST - 2 VIEW  Comparison: Portable chest x-ray of 11/14/2012 and 11/13/2012  Findings: Aeration has improved slightly.  There is still right apical pneumothorax present with right chest tube unchanged in position.  Right perihilar opacity remains although opacity noted previously at the right lung base has improved.  The heart is within normal limits in size.  IMPRESSION:  1.  Stable right apical pneumothorax with right chest tube remaining. 2.  Some improvement in opacity at the right lung base with haziness overlying the right hilum remaining.   Original Report Authenticated By: Dwyane Dee, M.D.    Ct Chest W Contrast  11/15/2012  *RADIOLOGY REPORT*  Clinical Data: Fever and right chest pain.  Status post right lobectomy.  Smoker.  CT CHEST WITH CONTRAST  Technique:  Multidetector CT imaging of the chest was performed following the standard protocol during bolus administration of intravenous contrast.  Contrast: 80mL OMNIPAQUE IOHEXOL 300 MG/ML  SOLN  Comparison: Chestradiographs obtained earlier today and chest CT dated 08/15/2012.  Findings: Interval resection of the cavitary mass in the right upper lobe with multiple surgical staple lines and surgical clips. Approximately 20% right hydropneumothorax.  A portion of this is located at the apex and another portion is posteromedially located and appears partially loculated.  No fluid collections with peripheral rim enhancement are seen.  There is minimal atelectasis at both lung bases.  Bullous changes in the left lung are again demonstrated, remaining most pronounced at the medial aspect of the left upper lobe.  No lung masses or enlarged lymph nodes are seen.  A 1 cm cyst in the right lobe of the liver is unchanged.  Mild thoracic spine degenerative changes.  IMPRESSION:  1.  Right postoperative changes with an approximately 20% right  hydropneumothorax. 2.  No abscess or evidence of lung empyema. 3.  Minimal bibasilar atelectasis. 4.  Stable changes of COPD in the left lung.   Original Report Authenticated By: Beckie Salts, M.D.    Ct Angio Chest Pe W/cm &/or Wo Cm  11/27/2012  *RADIOLOGY REPORT*  Clinical Data: Pleuritic chest pain, worsening shortness of breath and fever.  History of right upper lobectomy and MAC.  CT ANGIOGRAPHY CHEST  Technique:  Multidetector CT imaging of the chest using the standard protocol during bolus administration of intravenous contrast. Multiplanar reconstructed images including MIPs were obtained and reviewed to evaluate the vascular anatomy.  Contrast: 80mL OMNIPAQUE IOHEXOL 350 MG/ML SOLN  Comparison: CT of the chest performed 11/15/2012  Findings: There is no evidence of significant pulmonary embolus. Evaluation for pulmonary embolus is mildly suboptimal due to motion artifact.  There is a persistent small right-sided hydropneumothorax, most prominent at the right lung apex; this is grossly unchanged in appearance from the prior study.  Extensive postoperative change is noted along the right side of the mediastinum, with associated apparent dense sutures and underlying atelectasis.  Significant associated soft tissue density is of uncertain significance, tracking inferiorly along the posterior mediastinum.  The esophagus is obscured due to surrounding soft tissue attenuation.  The degree of soft tissue inflammation about the esophagus appears significantly worsened from the prior study; this could conceivably reflect diffuse esophagitis.  Previously noted dense airspace opacification within the inferior right middle lobe has improved.  There is a persistent somewhat loculated small right-sided pleural  effusion; previously noted air components within the effusion are less prominent.  Scattered blebs are seen at the left lung apex, and underlying emphysema is noted throughout both lungs, more prominent at the  lung apices.  The mediastinum is otherwise grossly unremarkable, though the significance of diffuse soft tissue attenuation along the right side of the mediastinum and posterior mediastinum is uncertain.  No pericardial effusion is identified.  The great vessels are grossly unremarkable in appearance.  No definite mediastinal lymphadenopathy is seen.  No axillary lymphadenopathy is seen.  The thyroid gland is unremarkable in appearance.  The visualized portions of the liver and spleen are unremarkable.  No acute osseous abnormalities are seen.  Chronic right-sided rib deformity is noted.  IMPRESSION:  1.  No evidence of significant pulmonary embolus. 2.  Extensive postoperative change again noted along the right side of the mediastinum, with associated soft tissue density tracking inferiorly along the posterior mediastinum, of uncertain significance.  The esophagus is obscured due to surrounding soft tissue attenuation.  Would correlate for history of malignancy; this could simply reflect postoperative change.  The degree of soft tissue inflammation about the esophagus appears significantly worsened from the recent prior study.  This could conceivably reflect diffuse esophagitis.  3.  Persistent small right-sided hydropneumothorax, most prominent at the right lung apex.  Loculated components noted along the right lung. 4.  Previously noted dense consolidation within the inferior right middle lung lobe has improved. 5.  Scattered underlying emphysema noted, with blebs seen at the left lung apex. 6.  Chronic right-sided rib deformity again noted.   Original Report Authenticated By: Tonia Ghent, M.D.    Dg Chest Port 1 View  11/14/2012  *RADIOLOGY REPORT*  Clinical Data: Right lower lobe pneumonia.  Status post VATS.  PORTABLE CHEST - 1 VIEW  Comparison: 11/13/2012  Findings: 0616 hours right chest tube remains in place.  The apical component of the pneumothorax is still visible and not substantially changed in  the interval.  Some mild bibasilar atelectasis or infiltrate persist.  No overt pulmonary edema. Heart size is stable. Telemetry leads overlie the chest.  IMPRESSION: Stable exam.   Original Report Authenticated By: Kennith Center, M.D.    Dg Chest Port 1 View  11/13/2012  *RADIOLOGY REPORT*  Clinical Data: Right-sided chest tube  PORTABLE CHEST - 1 VIEW  Comparison: 11/12/2012; 11/07/2012; 08/12/2012; chest CT - 08/15/2012  Findings:  Grossly unchanged cardiac silhouette and mediastinal contours given reduced lung volumes of patient rotation.  Interval removal of right jugular approach central venous catheter and the more inferior and laterally positioned right-sided chest tube.  The apically directed right-sided chest tube is unchanged in position. Grossly unchanged small right apical pneumothorax.  Heterogeneous air space opacities within the remaining right lung are grossly unchanged.  Grossly unchanged left basilar heterogeneous opacities. No new focal airspace opacity.  Query trace right-sided effusion, likely unchanged.  Unchanged bones.  IMPRESSION: 1.  Interval removal of support apparatus with unchanged positioning of remaining apically directed right-sided chest tube. Grossly unchanged small residual right-sided hydropneumothorax.  2.  Grossly unchanged heterogeneous air space opacities within the remaining right lung   Original Report Authenticated By: Tacey Ruiz, MD    Dg Chest Port 1 View  11/12/2012  *RADIOLOGY REPORT*  Clinical Data: Follow-up lobectomy.  PORTABLE CHEST - 1 VIEW  Comparison: 11/11/2012  Findings: Upper and lower right-sided chest tubes remain  in good position.  Approximately 10% right apical pneumothorax unchanged. Slight left basilar opacity may  be minimally improved.  Central venous catheter tip unchanged at cavoatrial junction. Cardiomegaly.  Moderate airspace disease right lung stable, with right hemithorax volume loss.  IMPRESSION: Stable right apical pneumothorax (10%).   Slight improvement left basilar aeration.   Original Report Authenticated By: Davonna Belling, M.D.    Dg Chest Port 1 View  11/11/2012  *RADIOLOGY REPORT*  Clinical Data: 56 year old female - postoperative right lobectomy for cavitary lesion.  PORTABLE CHEST - 1 VIEW  Comparison: 11/10/2012 and prior chest radiographs dating back to 2009.  Findings: Right thoracic postoperative changes are again identified. Two separate right thoracostomy tubes and a right IJ central venous catheter with tip overlying the cavoatrial junction again noted. Right hemithorax volume loss is present with airspace opacities within the remaining right lung, slightly improved since the prior study. A small (10%) right apical pneumothorax is unchanged. Mild left basilar opacity is again noted question atelectasis versus developing airspace disease.  IMPRESSION: Improved right lung aeration with continued small right apical pneumothorax (10%).  Mild left basilar opacity again noted which may represent atelectasis or developing airspace disease/pneumonia.  Stable support apparatus as described.   Original Report Authenticated By: Harmon Pier, M.D.    Dg Chest Port 1 View  11/10/2012  *RADIOLOGY REPORT*  Clinical Data: Evaluate right chest tubes.  PORTABLE CHEST - 1 VIEW  Comparison: 11/09/2012  Findings: Stable position of the right chest drains.  There is stable right apical pneumothorax that roughly measures 15%. Persistent parenchymal densities throughout the right lung.  Left lung is clear with exception of left basilar atelectasis.  Heart and mediastinum are stable.  The jugular central venous catheter is in the lower SVC region.  IMPRESSION: No significant change in the size of the right pneumothorax. Stable patchy densities throughout the right lung.  New patchy densities at the left lung may represent atelectasis.   Original Report Authenticated By: Richarda Overlie, M.D.    Dg Chest Port 1 View  11/09/2012  *RADIOLOGY REPORT*  Clinical  Data: Chest tubes.  Pneumothorax.  PORTABLE CHEST - 1 VIEW  Comparison: Chest 11/08/2012.  Findings: Two right chest tubes and right IJ catheter remain in place.  Small right apical pneumothorax is unchanged in appearance. There has been increase in right pleural effusion.  Airspace disease throughout the right chest appears increased.  Volume loss on the right with shift of the mediastinal contents again from left to right is again seen.  Left lung is clear.  Cardiac silhouette is partially obscured.  IMPRESSION:  1.  No change in a small right pneumothorax with a chest tube in place. 2.  Increased right effusion and airspace disease.   Original Report Authenticated By: Holley Dexter, M.D.    Dg Chest Port 1 View  11/08/2012  *RADIOLOGY REPORT*  Clinical Data: Post thoracotomy and lobectomy, evaluate pneumothorax  PORTABLE CHEST - 1 VIEW  Comparison: 11/07/2012; 11/06/2012; 11/05/2012  Findings:  Grossly unchanged cardiac silhouette and mediastinal contours with persistent deviation of the cardiomediastinal structures into the right hemithorax.  Stable position of support apparatus.  No change to minimal decrease in small to moderate-sized right-sided hydropneumothorax.  Worsening heterogeneous opacities in the remaining right lung.  The left hemithorax is grossly unchanged with minimal left basilar opacities favored to represent atelectasis.  Unchanged bones.  IMPRESSION: 1.  Stable positioning of support apparatus.  No change to minimal decrease in small to moderate-sized right-sided hydropneumothorax. 2.  Worsening heterogeneous opacities within the remaining right lung with differential considerations including atelectasis, edema  or developing infection.  Continued attention on follow-up is recommended.   Original Report Authenticated By: Tacey Ruiz, MD    Dg Chest Port 1 View  11/07/2012  *RADIOLOGY REPORT*  Clinical Data: Status post right lobectomy.  PORTABLE CHEST - 1 VIEW  Comparison: One-view  chest 11/06/2012.  Findings: The heart size is normal.  The right apical pneumothorax has increased in size.  Lung density has increased on the right as well.  The chest tubes are stable.  Minimal atelectasis is present at the left base.  IMPRESSION:  1.  Increasing right apical pneumothorax. 2.  Increasing consolidation of the residual right lung. 3.  The support apparatus is stable.   Original Report Authenticated By: Marin Roberts, M.D.    Dg Chest Port 1 View  11/06/2012  *RADIOLOGY REPORT*  Clinical Data: VATS.  PORTABLE CHEST - 1 VIEW  Comparison: 11/05/2012  Findings: Two right chest tubes remain in place.  Postoperative changes in the right lung.  Suspect small residual right apical pneumothorax although the pleural edges difficult to visualize today's study.  Lucency around the superior chest tube may reflect a small residual pneumothorax.  No confluent airspace opacities in the lungs.  Heart is normal size.  Subcutaneous air in the right chest wall, slightly increased.  IMPRESSION: Two right chest tube remain in place.  Suspect tiny residual right apical pneumothorax around the superior chest tube.   Original Report Authenticated By: Charlett Nose, M.D.    Dg Chest Portable 1 View  11/05/2012  *RADIOLOGY REPORT*  Clinical Data: Postop.  Status post VATS.  PORTABLE CHEST - 1 VIEW  Comparison: 11/02/2012  Findings: Lungs are hyperinflated.  Right-sided IJ central line tip overlies the level of the superior vena cava.  Dual right-sided chest tubes are in place.  There is a small right apical pneumothorax, associated with apical pleural thickening and apical lung sutures.  Perihilar bronchitic changes are again noted.  No evidence for pulmonary edema.  IMPRESSION:  1.  Postoperative appearance of the chest. 2.  Small right apical pneumothorax.   Original Report Authenticated By: Norva Pavlov, M.D.     Microbiology: Recent Results (from the past 240 hour(s))  URINE CULTURE     Status: Normal     Collection Time   11/26/12  6:13 PM      Component Value Range Status Comment   Specimen Description URINE, CLEAN CATCH   Final    Special Requests NONE   Final    Culture  Setup Time 11/26/2012 20:02   Final    Colony Count NO GROWTH   Final    Culture NO GROWTH   Final    Report Status 11/27/2012 FINAL   Final   CLOSTRIDIUM DIFFICILE BY PCR     Status: Normal   Collection Time   11/26/12 10:32 PM      Component Value Range Status Comment   C difficile by pcr NEGATIVE  NEGATIVE Final   CULTURE, EXPECTORATED SPUTUM-ASSESSMENT     Status: Normal   Collection Time   11/27/12 11:47 PM      Component Value Range Status Comment   Specimen Description SPUTUM   Final    Special Requests NONE   Final    Sputum evaluation     Final    Value: MICROSCOPIC FINDINGS SUGGEST THAT THIS SPECIMEN IS NOT REPRESENTATIVE OF LOWER RESPIRATORY SECRETIONS. PLEASE RECOLLECT.     CALLED TO RN S.COLUMBRES AT 0148 11/28/12 LGP   Report Status 11/28/2012  FINAL   Final   CULTURE, BLOOD (ROUTINE X 2)     Status: Normal (Preliminary result)   Collection Time   11/30/12  4:20 PM      Component Value Range Status Comment   Specimen Description BLOOD LEFT ARM   Final    Special Requests BOTTLES DRAWN AEROBIC AND ANAEROBIC 10CC   Final    Culture  Setup Time 11/30/2012 20:40   Final    Culture     Final    Value:        BLOOD CULTURE RECEIVED NO GROWTH TO DATE CULTURE WILL BE HELD FOR 5 DAYS BEFORE ISSUING A FINAL NEGATIVE REPORT   Report Status PENDING   Incomplete   CULTURE, BLOOD (ROUTINE X 2)     Status: Normal (Preliminary result)   Collection Time   11/30/12  4:25 PM      Component Value Range Status Comment   Specimen Description BLOOD LEFT HAND   Final    Special Requests BOTTLES DRAWN AEROBIC AND ANAEROBIC 10CC   Final    Culture  Setup Time 11/30/2012 20:40   Final    Culture     Final    Value:        BLOOD CULTURE RECEIVED NO GROWTH TO DATE CULTURE WILL BE HELD FOR 5 DAYS BEFORE ISSUING A FINAL  NEGATIVE REPORT   Report Status PENDING   Incomplete      Labs: Basic Metabolic Panel:  Lab 12/04/12 4540 12/03/12 0500 12/01/12 0516 11/30/12 0650 11/29/12 0715  NA 135 133* 135 138 140  K 4.3 3.2* 3.9 3.1* 3.0*  CL 98 97 99 102 103  CO2 25 25 25 24 25   GLUCOSE 96 192* 106* 981* 119*  BUN 7 6 5* 4* 4*  CREATININE 0.47* 0.51 0.52 0.43* 0.42*  CALCIUM 9.0 8.7 8.5 8.6 8.4  MG -- -- -- -- --  PHOS -- -- -- -- --   Liver Function Tests:  Lab 11/28/12 0644  AST 10  ALT 8  ALKPHOS 163*  BILITOT 0.4  PROT 6.4  ALBUMIN 2.0*   No results found for this basename: LIPASE:5,AMYLASE:5 in the last 168 hours No results found for this basename: AMMONIA:5 in the last 168 hours CBC:  Lab 12/04/12 0557 12/03/12 0500 12/01/12 0516 11/30/12 0650 11/29/12 0715  WBC 11.0* 8.3 4.4 7.4 10.1  NEUTROABS -- -- -- -- --  HGB 10.5* 10.5* 9.5* 9.9* 9.2*  HCT 31.7* 33.0* 30.1* 29.8* 28.2*  MCV 94.1 95.7 95.0 94.9 95.6  PLT 471* 453* 421* 446* 380   Cardiac Enzymes:  Lab 11/27/12 1200  CKTOTAL --  CKMB --  CKMBINDEX --  TROPONINI <0.30   BNP: BNP (last 3 results) No results found for this basename: PROBNP:3 in the last 8760 hours CBG: No results found for this basename: GLUCAP:5 in the last 168 hours     Signed:  Benjamine Mola, Conway Fedora  [facility] 12/04/2012, 10:00 AM

## 2012-12-06 ENCOUNTER — Ambulatory Visit: Payer: 59 | Admitting: Internal Medicine

## 2012-12-06 LAB — CULTURE, BLOOD (ROUTINE X 2): Culture: NO GROWTH

## 2012-12-14 ENCOUNTER — Ambulatory Visit (INDEPENDENT_AMBULATORY_CARE_PROVIDER_SITE_OTHER): Payer: 59 | Admitting: Internal Medicine

## 2012-12-14 ENCOUNTER — Encounter: Payer: Self-pay | Admitting: *Deleted

## 2012-12-14 VITALS — BP 102/70 | HR 101 | Temp 98.1°F | Wt 109.0 lb

## 2012-12-14 DIAGNOSIS — R222 Localized swelling, mass and lump, trunk: Secondary | ICD-10-CM

## 2012-12-14 DIAGNOSIS — R197 Diarrhea, unspecified: Secondary | ICD-10-CM

## 2012-12-14 DIAGNOSIS — R509 Fever, unspecified: Secondary | ICD-10-CM

## 2012-12-14 DIAGNOSIS — R918 Other nonspecific abnormal finding of lung field: Secondary | ICD-10-CM

## 2012-12-14 DIAGNOSIS — K219 Gastro-esophageal reflux disease without esophagitis: Secondary | ICD-10-CM

## 2012-12-14 DIAGNOSIS — R634 Abnormal weight loss: Secondary | ICD-10-CM

## 2012-12-14 DIAGNOSIS — F17200 Nicotine dependence, unspecified, uncomplicated: Secondary | ICD-10-CM

## 2012-12-14 DIAGNOSIS — D649 Anemia, unspecified: Secondary | ICD-10-CM

## 2012-12-14 LAB — CBC WITH DIFFERENTIAL/PLATELET
Basophils Absolute: 0.1 10*3/uL (ref 0.0–0.1)
Basophils Relative: 0.9 % (ref 0.0–3.0)
Eosinophils Absolute: 0.2 10*3/uL (ref 0.0–0.7)
MCHC: 32.6 g/dL (ref 30.0–36.0)
MCV: 93.9 fl (ref 78.0–100.0)
Monocytes Absolute: 0.8 10*3/uL (ref 0.1–1.0)
Neutrophils Relative %: 78.2 % — ABNORMAL HIGH (ref 43.0–77.0)
Platelets: 665 10*3/uL — ABNORMAL HIGH (ref 150.0–400.0)
RDW: 16 % — ABNORMAL HIGH (ref 11.5–14.6)

## 2012-12-14 LAB — BASIC METABOLIC PANEL
BUN: 11 mg/dL (ref 6–23)
CO2: 26 mEq/L (ref 19–32)
Chloride: 103 mEq/L (ref 96–112)
Creatinine, Ser: 0.6 mg/dL (ref 0.4–1.2)

## 2012-12-14 MED ORDER — PANTOPRAZOLE SODIUM 40 MG PO TBEC: 40.0000 mg | DELAYED_RELEASE_TABLET | Freq: Two times a day (BID) | ORAL | Status: DC

## 2012-12-14 NOTE — Assessment & Plan Note (Signed)
Reports mild low-grade temperature since he left the hospital, good compliance with antibiotics, to see ID soon

## 2012-12-14 NOTE — Assessment & Plan Note (Signed)
Symptoms relatively well controlled with PPIs twice a day

## 2012-12-14 NOTE — Assessment & Plan Note (Addendum)
Mild anemic while @ the hospital. Self discontinue iron due to constipation. We're checking a CBC

## 2012-12-14 NOTE — Assessment & Plan Note (Signed)
resolved 

## 2012-12-14 NOTE — Progress Notes (Signed)
  Subjective:    Patient ID: [name] [name], female    DOB: 05-Mar-1956, 57 y.o.   MRN: [id number]  HPI Hospital followup Status post thoracotomy and upper lobectomy December 2013, was readmitted to the hospital 11-26-12 with chest pain, fever, question of esophagitis, anemia and diarrhea. Workup included a CT of the chest that showed no PE, negative EGD, negative C. Difficile. She was released on antibiotics and is currently doing slightly better.  Past Medical History  Diagnosis Date  . COPD (chronic obstructive pulmonary disease)     per CT 11/10/05, will minimal bronchiectasis rll  . GERD (gastroesophageal reflux disease)   . Osteoporosis     dexa per gyn  . 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 Headache   . Lichen planus   . B12 deficiency   . Endometriosis   . Cataracts, bilateral   . Arthritis   . Lung mass   . Asthma     .  years ago- has not had problem for years.  . Pneumonia     last ime 2008  . Hemorrhoid   . Pneumothorax, spontaneous, tension     x 2 in her 20's.  has a chest tube for one       Past Surgical History  Procedure Date  . Knee surgery     .not replacement .  Marland Kitchen Ovarian cyst removed   . Video bronchoscopy 02/15/2012    Procedure: VIDEO BRONCHOSCOPY WITH FLUORO;  Surgeon: Nyoka Cowden, MD;  Location: [facility];  Service: Endoscopy;  Laterality: Bilateral;  WASHING- INTERVENTION BIOPSIES INTERVENTION BRONCHOSCOPY WITH VIDEO  . Mediastinoscopy 05/25/2012    Procedure: MEDIASTINOSCOPY;  Surgeon: Alleen Borne, MD;  Location: [facility] OR;  Service: Thoracic;  Laterality: N/A;  . Tonsillectomy   . Flexible bronchoscopy 11/05/2012    Procedure: FLEXIBLE BRONCHOSCOPY;  Surgeon: Alleen Borne, MD;  Location: [facility] OR;  Service: Thoracic;  Laterality: N/A;  . Thoracotomy 11/05/2012    Procedure: THORACOTOMY MAJOR;  Surgeon: Alleen Borne, MD;  Location: [facility] OR;  Service: Thoracic;  Laterality: Right;  . Lobectomy 11/05/2012    Procedure: LOBECTOMY;   Surgeon: Alleen Borne, MD;  Location: [facility] OR;  Service: Thoracic;  Laterality: Right;  right upper lobectomy  . Esophagogastroduodenoscopy 12/03/2012    Procedure: ESOPHAGOGASTRODUODENOSCOPY (EGD);  Surgeon: Hart Carwin, MD;  Location: [facility] ENDOSCOPY;  Service: Endoscopy;  Laterality: N/A;     Review of Systems At this point, still has low grade temperature around 99.2 some times . Still has chest pain on and off but not severe. Occasional shortness of breath, mild cough. Pulse oximetry at home is always normal. Diarrhea has subsided. She is on PPIs twice a day, GERD symptoms are much improved. Appetite is not the best however she has increased 4 pounds since she left the hospital according to her own scale. Discontinued tobacco 11/04/2012. Praised. Good compliance with medications except iron---> discontinued due to constipation.    Objective:   Physical Exam General -- alert, well-developed, and underweight appearing, no distress.Marland Kitchen    HEENT -- no jaundice,not pale  Lungs --decreased breath sounds otherwise clear Heart-- normal rate, regular rhythm, no murmur, and no gallop.   Extremities-- no pretibial edema bilaterally Neurologic-- alert & oriented X3  Psych-- Cognition and judgment appear intact. Alert and cooperative with normal attention span and concentration.  not anxious appearing and not depressed appearing.      Assessment & Plan:

## 2012-12-14 NOTE — Patient Instructions (Signed)
Next visit in 4 to 5 months

## 2012-12-14 NOTE — Assessment & Plan Note (Signed)
Reports a 4 pound weight gain since she left the hospital

## 2012-12-14 NOTE — Assessment & Plan Note (Signed)
Quit tobacco 11/04/2012!

## 2012-12-15 ENCOUNTER — Encounter: Payer: Self-pay | Admitting: Internal Medicine

## 2012-12-17 ENCOUNTER — Encounter: Payer: Self-pay | Admitting: *Deleted

## 2012-12-18 ENCOUNTER — Ambulatory Visit
Admission: RE | Admit: 2012-12-18 | Discharge: 2012-12-18 | Disposition: A | Discharge status: Home / Self Care | Payer: 59 | Source: Ambulatory Visit | Attending: Surgery | Admitting: Surgery

## 2012-12-18 ENCOUNTER — Other Ambulatory Visit: Payer: Self-pay | Admitting: *Deleted

## 2012-12-18 ENCOUNTER — Encounter: Payer: Self-pay | Admitting: Surgery

## 2012-12-18 ENCOUNTER — Ambulatory Visit (INDEPENDENT_AMBULATORY_CARE_PROVIDER_SITE_OTHER): Payer: Self-pay | Admitting: Surgery

## 2012-12-18 VITALS — BP 98/68 | HR 98 | Resp 20 | Ht 66.0 in | Wt 109.0 lb

## 2012-12-18 DIAGNOSIS — J852 Abscess of lung without pneumonia: Secondary | ICD-10-CM

## 2012-12-18 DIAGNOSIS — Z902 Acquired absence of lung [part of]: Secondary | ICD-10-CM

## 2012-12-18 DIAGNOSIS — R918 Other nonspecific abnormal finding of lung field: Secondary | ICD-10-CM

## 2012-12-18 DIAGNOSIS — R911 Solitary pulmonary nodule: Secondary | ICD-10-CM

## 2012-12-18 DIAGNOSIS — Z9889 Other specified postprocedural states: Secondary | ICD-10-CM

## 2012-12-18 DIAGNOSIS — R222 Localized swelling, mass and lump, trunk: Secondary | ICD-10-CM

## 2012-12-18 NOTE — Progress Notes (Signed)
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 She had a slow postoperative course due to pain and some pneumonia developing in the right lung. She improved with antibiotics. After initial discharge she was readmitted to the hospitalist service for treatment of pain that was out of control and fever. She is now at home and doing fairly well although she said she still has a lot of right chest wall pain. She denies any fever or chills. She has had no sputum production. She has continued to abstain from smoking since surgery. 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 She's been treated with antibiotics under the direction of Infectious Disease.   Current Outpatient Prescriptions  Medication Sig Dispense Refill  . acetaminophen (TYLENOL) 500 MG tablet Take 500 mg by mouth once as needed. For fever      . ALPRAZolam (XANAX) 0.5 MG tablet Take 0.25-0.5 mg by mouth at bedtime as needed. For insomnia.      Marland Kitchen azithromycin (ZITHROMAX) 600 MG tablet Take 1 tablet (600 mg total) by mouth daily.  30 tablet  1  . ethambutol (MYAMBUTOL) 400 MG tablet Take 800 mg by mouth daily.      . feeding supplement (RESOURCE BREEZE) LIQD Take 1 Container by mouth 3 (three) times daily between meals.      . gabapentin (NEURONTIN) 100 MG capsule Take 200 mg by mouth daily.      Marland Kitchen HYDROcodone-acetaminophen (NORCO/VICODIN) 5-325 MG per tablet Take 1 tablet by mouth every 6 (six) hours as needed.      . isoniazid (NYDRAZID) 300 MG tablet Take 1 tablet (300 mg total) by mouth daily.  30 tablet  0  . nicotine polacrilex (NICORETTE) 4 MG lozenge Place 4 mg inside cheek every 3 (three) hours as needed. For nicotine cravings.      . pantoprazole (PROTONIX) 40 MG tablet Take  1 tablet (40 mg total) by mouth 2 (two) times daily before a meal.  60 tablet  6  . rifampin (RIFADIN) 300 MG capsule Take 600 mg by mouth daily.         Physical Exam: BP 98/68  Pulse 98  Resp 20  Ht 5\' 6"  (1.676 m)  Wt 109 lb (49.442 kg)  BMI 17.59 kg/m2  SpO2 97%  LMP 11/05/2004 She looks well. Lung exam is clear. The right thoracotomy incision is healing well.  Diagnostic Tests:  *RADIOLOGY REPORT*   Clinical Data: Follow-up lung abscess.   CHEST - 2 VIEW   Comparison: 11/30/2012.  Chest CT 11/26/2012.   Findings: Stable presumed loculated right apical hydropneumothorax. Small air-fluid level noted in the right apex.  Left lung is clear. Heart is normal size.  No acute bony abnormality.   IMPRESSION: Continued air collection in the right apex with air-fluid level, presumed loculated right hydropneumothorax.     Original Report Authenticated By: Charlett Nose, M.D.    Impression:  Overall I think she is making good progress following her surgery. She still has a small right apical space on cxr that is not unusual after upper lobectomy. Pain seems  to be the major issue at this time although it is getting better. I wrote her a prescription for Hydrocodone/APAP 7.5/325, one po every four hours as needed #90. I think it will take several more months for her pain to improve to the point that she doesn't need pain medicine. She will continue antibiotics and followup with ID.  Plan:  She will return to see me in two weeks.

## 2012-12-19 ENCOUNTER — Inpatient Hospital Stay: Payer: 59 | Admitting: Infectious Disease

## 2012-12-20 ENCOUNTER — Ambulatory Visit (INDEPENDENT_AMBULATORY_CARE_PROVIDER_SITE_OTHER): Payer: 59 | Admitting: Internal Medicine

## 2012-12-20 ENCOUNTER — Encounter: Payer: Self-pay | Admitting: Internal Medicine

## 2012-12-20 VITALS — BP 122/80 | HR 98 | Temp 97.9°F | Ht 67.0 in | Wt 112.0 lb

## 2012-12-20 DIAGNOSIS — A310 Pulmonary mycobacterial infection: Secondary | ICD-10-CM

## 2012-12-20 DIAGNOSIS — A319 Mycobacterial infection, unspecified: Secondary | ICD-10-CM

## 2012-12-20 LAB — COMPLETE METABOLIC PANEL WITH GFR
ALT: 17 U/L (ref 0–35)
Alkaline Phosphatase: 79 U/L (ref 39–117)
CO2: 29 mEq/L (ref 19–32)
GFR, Est African American: >89 mL/min
Sodium: 140 mEq/L (ref 135–145)
Total Bilirubin: 0.2 mg/dL — ABNORMAL LOW (ref 0.3–1.2)
Total Protein: 7.2 g/dL (ref 6.0–8.3)

## 2012-12-20 LAB — CBC WITH DIFFERENTIAL/PLATELET
Eosinophils Absolute: 0.3 10*3/uL (ref 0.0–0.7)
Lymphs Abs: 1.7 10*3/uL (ref 0.7–4.0)
MCH: 31 pg (ref 26.0–34.0)
Neutrophils Relative %: 61 % (ref 43–77)
Platelets: 600 10*3/uL — ABNORMAL HIGH (ref 150–400)
RBC: 3.94 MIL/uL (ref 3.87–5.11)
WBC: 7.2 10*3/uL (ref 4.0–10.5)

## 2012-12-20 MED ORDER — RIFAMPIN 300 MG PO CAPS: 600.0000 mg | ORAL_CAPSULE | Freq: Every day | ORAL | Status: DC

## 2012-12-20 MED ORDER — ETHAMBUTOL HCL 400 MG PO TABS: 800.0000 mg | ORAL_TABLET | Freq: Every day | ORAL | Status: DC

## 2012-12-20 MED ORDER — ISONIAZID 300 MG PO TABS: 300.0000 mg | ORAL_TABLET | Freq: Every day | ORAL | Status: DC

## 2012-12-20 MED ORDER — PYRIDOXINE HCL 50 MG PO TABS: 50.0000 mg | ORAL_TABLET | Freq: Every day | ORAL | Status: DC

## 2012-12-20 NOTE — Progress Notes (Signed)
  Subjective:    Patient ID: [name] [name], female    DOB: 04-12-1956, 57 y.o.   MRN: [id number]  HPI She comes in here for followup of her hospitalization as well as follow up of her mycobacterial disease. She had undergone a right upper lobe lobectomy and right lower lobe superior segmentectomy. 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  Since I first saw her, I started her on clarithromycin, rifampin and ethambutol for presumed mycobacterial disease and during a hospitalization after I saw her, this was changed to azithromycin with rifampin, isoniazid and ethambutol. She now comes in and is on all 4 therapies. Overall, she does feel slightly improved with her breathing but does complain of weakness, difficulty to gain weight, night sweats and continued shortness of breath. She also has some weakness of her hands. She has temperatures that are in the 99 but no high temperatures. She was treated for healthcare associated pneumonia in the hospital. She has decided that she is not going to return to pulmonary for further evaluation.     Review of Systems  Constitutional: Positive for activity change, appetite change and fatigue. Negative for fever and chills.       4 pound weight gain  Respiratory: Positive for cough and shortness of breath. Negative for wheezing.   Gastrointestinal: Negative for abdominal pain, diarrhea and constipation.  Musculoskeletal:       Weakness in both hands as well as general weakness  Neurological: Negative for dizziness and headaches.       Objective:   Physical Exam  Constitutional:       Chronically ill-appearing  Cardiovascular: Normal rate, regular rhythm and normal heart sounds.  Exam reveals no gallop and no friction rub.   No murmur heard. Pulmonary/Chest: Effort normal and breath sounds normal. No respiratory distress. She has no wheezes. She has no rales.       Breathing about 20 times a minute Diminished at the  right upper lower  Skin: No rash noted.          Assessment & Plan:

## 2012-12-20 NOTE — Assessment & Plan Note (Addendum)
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 The treatment will be 3 drugs with isoniazid, ethambutol and rifampin. If she has little improvement, it is possible to add back clarithromycin or Avelox. I will check for resistance in this organism to assure no rifampin resistance. I will also check her LFTs today and recheck her hemoglobin. I will give her a prescription for pyridoxine to take along with the INH. I did discuss the course of infection with her at length including the very difficult situation of being hard to treat in the long course of therapy which will be at least one year.  More than 40 minutes was spent with her including review of her labs, counseling and coordination of care.    She will follow up in 1 month and I will recheck her sputum.

## 2012-12-24 ENCOUNTER — Telehealth: Payer: Self-pay | Admitting: *Deleted

## 2012-12-24 NOTE — Telephone Encounter (Signed)
Patient called regarding her liver function results from 12/20/12. Please advise. Thank you. Andree Coss, RN

## 2012-12-25 ENCOUNTER — Telehealth: Payer: Self-pay | Admitting: *Deleted

## 2012-12-25 NOTE — Telephone Encounter (Signed)
All normal.

## 2012-12-25 NOTE — Telephone Encounter (Signed)
Called patient to notify her of normal LFT results.  Patient asked if she could follow up at her PCP monthly and have results sent here, as her PCP visits would be free.  She will discuss her options with Dr. Luciana Axe at her next visit.  Andree Coss, RN

## 2012-12-27 ENCOUNTER — Other Ambulatory Visit: Payer: Self-pay | Admitting: *Deleted

## 2012-12-27 DIAGNOSIS — R911 Solitary pulmonary nodule: Secondary | ICD-10-CM

## 2012-12-28 ENCOUNTER — Telehealth: Payer: Self-pay | Admitting: Licensed Clinical Social Worker

## 2012-12-28 NOTE — Telephone Encounter (Signed)
Jill Side, RN nurse from [facility] the [employer] called to make sure she was keeping her appointments, and verifying regimen. She wanted [place] to call her if anything changes.

## 2012-12-31 ENCOUNTER — Telehealth: Payer: Self-pay | Admitting: Internal Medicine

## 2012-12-31 NOTE — Telephone Encounter (Signed)
Received fax from [facility] pharmacy requesting refill for pts alprazolam. Last filled 07/18/12 #90 with 1 RF. Okay to refill? Please advise.

## 2012-12-31 NOTE — Telephone Encounter (Signed)
refill  ALPRAZolam (Tab) 0.5 MG Take 0.25-0.5 mg by mouth at bedtime as needed. For insomnia. #90 wt/1-refill last fill 8.14.13

## 2013-01-01 ENCOUNTER — Encounter: Payer: Self-pay | Admitting: Surgery

## 2013-01-01 ENCOUNTER — Ambulatory Visit (INDEPENDENT_AMBULATORY_CARE_PROVIDER_SITE_OTHER): Payer: Self-pay | Admitting: Surgery

## 2013-01-01 VITALS — BP 119/74 | HR 79 | Resp 16 | Ht 67.0 in | Wt 117.0 lb

## 2013-01-01 DIAGNOSIS — Z09 Encounter for follow-up examination after completed treatment for conditions other than malignant neoplasm: Secondary | ICD-10-CM

## 2013-01-01 DIAGNOSIS — J852 Abscess of lung without pneumonia: Secondary | ICD-10-CM

## 2013-01-01 MED ORDER — ALPRAZOLAM 0.5 MG PO TABS: 0.2500 mg | ORAL_TABLET | Freq: Every evening | ORAL | Status: DC | PRN

## 2013-01-01 NOTE — Addendum Note (Signed)
Addended by: Willow Ora E on: 01/01/2013 11:43 AM   Modules accepted: Orders

## 2013-01-01 NOTE — Progress Notes (Signed)
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 She had a slow postoperative course due to pain and some pneumonia developing in the right lung. She improved with antibiotics. After initial discharge she was readmitted to the hospitalist service for treatment of pain that was out of control and fever. I last saw her on 12/18/2012 at which time she was still having a lot of pain but she says it is continuing to get better. She is using less pain medication.She denies any fever or chills. She has had no sputum production. She is eating well and gaining weight.She has continued to abstain from smoking since surgery. 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 She's been treated with antibiotics under the direction of Infectious Disease.    Current Outpatient Prescriptions  Medication Sig Dispense Refill  . acetaminophen (TYLENOL) 500 MG tablet Take 500 mg by mouth once as needed. For fever      . ethambutol (MYAMBUTOL) 400 MG tablet Take 2 tablets (800 mg total) by mouth daily.  60 tablet  5  . feeding supplement (RESOURCE BREEZE) LIQD Take 1 Container by mouth 3 (three) times daily between meals.      . gabapentin (NEURONTIN) 100 MG capsule Take 200 mg by mouth daily.      Marland Kitchen HYDROcodone-acetaminophen (NORCO) 7.5-325 MG per tablet Take 1-2 tablets by mouth every 4 (four) hours as needed.      . isoniazid (NYDRAZID) 300 MG tablet Take 1 tablet (300 mg total) by mouth daily.  30 tablet  5  . nicotine polacrilex (NICORETTE) 4 MG lozenge Place 4 mg inside cheek every 3 (three) hours as needed. For nicotine cravings.      . pantoprazole (PROTONIX) 40 MG tablet Take 1 tablet (40 mg total) by mouth 2 (two) times daily before a meal.  60 tablet  6  .  pyridOXINE (B-6) 50 MG tablet Take 1 tablet (50 mg total) by mouth daily.  30 tablet  5  . rifampin (RIFADIN) 300 MG capsule Take 2 capsules (600 mg total) by mouth daily.  60 capsule  5  . ALPRAZolam (XANAX) 0.5 MG tablet Take 0.5-1 tablets (0.25-0.5 mg total) by mouth at bedtime as needed. For insomnia.  90 tablet  1     Physical Exam:  BP 119/74  Pulse 79  Resp 16  Ht 5\' 7"  (1.702 m)  Wt 117 lb (53.071 kg)  BMI 18.32 kg/m2  SpO2 98%  LMP 11/05/2004 She looks well. Lung exam is clear. The right thoracotomy incision is well-healed.  Diagnostic Tests:  Chest x-ray was not performed today since her last chest x-ray looked stable and she has had no clinical changes.  Impression:  Overall I think she is making continued progress following a major operation. I told her that I would expect her pain to continue to improve over 6-12 months postoperatively. The lack of sweating in her right hand may be related to ablation of the sympathetic ganglion which was located in the general area where this lung abscess had eroded into the chest wall posteriorly. The weakness in  her right hand is probably related to some stretching of the brachial plexus from positioning. I told her that this should improve gradually over time but may take up to a year to completely resolve. She would like to try to go back to work part-time next Monday, 01/07/2013. I told her I thought that would be acceptable but I am not sure how long it will be until she can work full-time. I think she will have to see how her stamina and pain control is after starting back part-time.  Plan:  I will see her back in about 1 month for followup. She will contact my office for an appointment. She will continue followup with infectious disease concerning antibiotic treatment for her mycobacterial infection. I did write her a prescription today for hydrocodone/APAP 7.5/325 one every 4 hours when necessary for pain #90. I also refilled a   prescription for Neurontin 200 mg daily #60.

## 2013-01-01 NOTE — Telephone Encounter (Signed)
Done

## 2013-01-09 LAB — AFB CULTURE WITH SMEAR (NOT AT ARMC)

## 2013-01-16 ENCOUNTER — Other Ambulatory Visit: Payer: Self-pay | Admitting: *Deleted

## 2013-01-16 DIAGNOSIS — G8918 Other acute postprocedural pain: Secondary | ICD-10-CM

## 2013-01-16 MED ORDER — HYDROCODONE-ACETAMINOPHEN 7.5-325 MG PO TABS: 1.0000 | ORAL_TABLET | ORAL | Status: DC | PRN

## 2013-01-17 ENCOUNTER — Ambulatory Visit: Payer: 59 | Admitting: Internal Medicine

## 2013-01-21 ENCOUNTER — Telehealth: Payer: Self-pay | Admitting: *Deleted

## 2013-01-21 NOTE — Telephone Encounter (Signed)
Patient rescheduled from snow day (01/17/13) for 03/05/13.  Patient ok with waiting another month, but had questions. At time of call, could only remember a few   - Is 03/05/13 too far out for her?   - Would it be possible for her PCP Dr. Drue Novel to collect any labs that might be needed in the mean time? Patient will call back with her other questions. Andree Coss, RN

## 2013-01-22 ENCOUNTER — Telehealth: Payer: Self-pay | Admitting: *Deleted

## 2013-01-22 NOTE — Telephone Encounter (Signed)
4/1 will be fine.  We do not need to follow her labs that closely as long as she is tolerating the medication well.  About every 3 months will be fine.

## 2013-01-22 NOTE — Telephone Encounter (Signed)
Patient called c/o wheezing, she has an appointment with her surgeon and will have him listen to her lungs. She was concerned about her vision because the meds she is taking can affect this. I told her to just let us know if she notices any changes, currently she has not. She is still having a productive cough and I told her it could take several months for her to show improvement. [name] [name]

## 2013-01-23 NOTE — Telephone Encounter (Signed)
Patient notified [name] [name]  

## 2013-01-23 NOTE — Telephone Encounter (Signed)
She should see her PCP if wheezing persists, she may need inhalers.

## 2013-01-29 ENCOUNTER — Ambulatory Visit (HOSPITAL_COMMUNITY)
Admission: RE | Admit: 2013-01-29 | Discharge: 2013-01-29 | Disposition: A | Discharge status: Home / Self Care | Payer: 59 | Source: Ambulatory Visit | Attending: Surgery | Admitting: Surgery

## 2013-01-29 ENCOUNTER — Ambulatory Visit (INDEPENDENT_AMBULATORY_CARE_PROVIDER_SITE_OTHER): Payer: Self-pay | Admitting: Surgery

## 2013-01-29 ENCOUNTER — Encounter: Payer: Self-pay | Admitting: Surgery

## 2013-01-29 VITALS — BP 116/78 | HR 79 | Resp 18 | Ht 67.0 in | Wt 117.0 lb

## 2013-01-29 DIAGNOSIS — R911 Solitary pulmonary nodule: Secondary | ICD-10-CM

## 2013-01-29 DIAGNOSIS — J984 Other disorders of lung: Secondary | ICD-10-CM | POA: Insufficient documentation

## 2013-01-29 DIAGNOSIS — J852 Abscess of lung without pneumonia: Secondary | ICD-10-CM

## 2013-01-29 NOTE — Progress Notes (Signed)
[address]       [name] [address]             [phone]      HPI:  [name] [name] returns today for followup due to development of some new right anterior chest pain which she said has been present for several weeks. It typically occurs with deep breathing and coughing. She is continuing to cough up some greenish brown sputum intermittently but has had no hemoptysis. She denies any fever or chills. Her appetite has been good and she has been gaining weight. She is back at work full time. She denies any significant shortness of breath.  Current Outpatient Prescriptions  Medication Sig Dispense Refill  . acetaminophen (TYLENOL) 500 MG tablet Take 500 mg by mouth once as needed. For fever      . ALPRAZolam (XANAX) 0.5 MG tablet Take 0.5-1 tablets (0.25-0.5 mg total) by mouth at bedtime as needed. For insomnia.  90 tablet  1  . ethambutol (MYAMBUTOL) 400 MG tablet Take 2 tablets (800 mg total) by mouth daily.  60 tablet  5  . HYDROcodone-acetaminophen (NORCO) 7.5-325 MG per tablet Take 1 tablet by mouth every 4 (four) hours as needed.  90 tablet  0  . isoniazid (NYDRAZID) 300 MG tablet Take 1 tablet (300 mg total) by mouth daily.  30 tablet  5  . nicotine polacrilex (NICORETTE) 4 MG lozenge Place 4 mg inside cheek every 3 (three) hours as needed. For nicotine cravings.      . pantoprazole (PROTONIX) 40 MG tablet Take 1 tablet (40 mg total) by mouth 2 (two) times daily before a meal.  60 tablet  6  . pyridOXINE (B-6) 50 MG tablet Take 1 tablet (50 mg total) by mouth daily.  30 tablet  5  . rifampin (RIFADIN) 300 MG capsule Take 2 capsules (600 mg total) by mouth daily.  60 capsule  5   No current facility-administered medications for this visit.     Physical Exam: BP 116/78  Pulse 79  Resp 18  Ht 5\' 7"  (1.702 m)  Wt 117 lb (53.071 kg)  BMI 18.32 kg/m2  SpO2 98%  LMP 11/05/2004 She looks well. Her lung exam is clear. The right thoracotomy incision is well-healed. There  is no deformity or tenderness to palpation in the chest wall.  Diagnostic Tests:  *RADIOLOGY REPORT*   Clinical Data: Follow up lung lesion.   CHEST - 2 VIEW   Comparison: None   Findings: Heart size is normal.  There are postsurgical changes and volume loss involving the right lung.  Asymmetric elevation of the right hemidiaphragm is associated.  Right upper lobe cavity is again noted.  Since the previous exam there is been increase and fluid within this cavity.  Scarring within the left apex is similar to previous exam.   IMPRESSION:   1.  Right upper lobe cavity has increased in fluid since the previous exam. 2.  No other new findings identified.     Original Report Authenticated By: Signa Kell, M.D.   Impression:  Overall I think she is doing very well. Her chest x-ray shows slightly increased fluid in the right apical space which I would expect. The apical space seems to be collapsing down some. Her residual lung was not large enough to fill the entire pleural space. There is no abnormality within the lung and no pleural effusions. I suspect that her right anterior chest wall pain is probably postsurgical. I recommended that  she take some pain medicine as needed. She continues on antibiotics for Mycobacterium and will be on them for a one year course.  Plan:  She will continue followup with Infectious Disease. She will return to see me if she develops any worsening chest pain, fever, cough, or shortness of breath.

## 2013-02-18 ENCOUNTER — Encounter: Payer: Self-pay | Admitting: Lab

## 2013-02-19 ENCOUNTER — Ambulatory Visit (INDEPENDENT_AMBULATORY_CARE_PROVIDER_SITE_OTHER): Payer: 59 | Admitting: Internal Medicine

## 2013-02-19 ENCOUNTER — Encounter: Payer: Self-pay | Admitting: Internal Medicine

## 2013-02-19 VITALS — BP 132/84 | HR 94

## 2013-02-19 DIAGNOSIS — Z902 Acquired absence of lung [part of]: Secondary | ICD-10-CM

## 2013-02-19 DIAGNOSIS — A310 Pulmonary mycobacterial infection: Secondary | ICD-10-CM

## 2013-02-19 DIAGNOSIS — F17200 Nicotine dependence, unspecified, uncomplicated: Secondary | ICD-10-CM

## 2013-02-19 DIAGNOSIS — M1990 Unspecified osteoarthritis, unspecified site: Secondary | ICD-10-CM

## 2013-02-19 DIAGNOSIS — D649 Anemia, unspecified: Secondary | ICD-10-CM

## 2013-02-19 NOTE — Patient Instructions (Addendum)
Next visit in 6 months for a  physical exam, please make an appointment

## 2013-02-19 NOTE — Progress Notes (Signed)
  Subjective:    Patient ID: [name] [name], female    DOB: 26-Jun-1956, 57 y.o.   MRN: [id number]  HPI Routine checkup. Has a mycobacterial infection, good compliance with triple antibiotics, would like to have her blood work here. History of DJD, taking hydrocodone sporadically.  Past Medical History  Diagnosis Date  . COPD (chronic obstructive pulmonary disease)     per CT 11/10/05, will minimal bronchiectasis rll  . GERD (gastroesophageal reflux disease)   . Osteoporosis     dexa per gyn  . 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 Headache   . Lichen planus   . B12 deficiency   . Endometriosis   . Cataracts, bilateral   . Arthritis   . Lung mass   . Asthma     .  years ago- has not had problem for years.  . Pneumonia     last ime 2008  . Hemorrhoid   . Pneumothorax, spontaneous, tension     x 2 in her 20's.  has a chest tube for one  . 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 Appetite is normal. No chest pain, shortness or breath or cough, occasionally here and wheezing. Has gained a small amount of weight since the last time she was here. Since the surgery, her right hand is dry, no sweating    Objective:   Physical Exam General -- alert, well-developed, since the last time she was here she looks a little stronger .   Lungs --decreased breath sounds otherwise clear Heart-- normal rate, regular rhythm, no murmur, and no gallop.   Extremities-- right hand is indeed dry  Neurologic-- alert & oriented X3 and strength normal in all extremities. Psych-- Cognition and judgment appear intact. Alert and cooperative with normal attention span and concentration.  not anxious appearing and not depressed appearing.      Assessment & Plan:

## 2013-02-19 NOTE — Assessment & Plan Note (Signed)
Anemia resolved per last CBC, platelets were elevated, probably a reaction to the infection. Recheck a CBC

## 2013-02-19 NOTE — Assessment & Plan Note (Signed)
followup by ID, LFTs checked today.

## 2013-02-19 NOTE — Assessment & Plan Note (Signed)
Continue with hydrocodone when necessary, refill when needed

## 2013-02-19 NOTE — Assessment & Plan Note (Addendum)
Recovered from right upper lobe, superior segmentectomy. Final dx-----mycobacterial infection Right hand is dry since the surgery, was told a sequelae from surgery. In general doing very well, was recommended to followup with surgery as needed

## 2013-02-19 NOTE — Assessment & Plan Note (Signed)
She essentially quit few months ago but since then had 2 relapses, encouraged to stay away from tobacco.

## 2013-02-20 LAB — CBC WITH DIFFERENTIAL/PLATELET
HCT: 39 % (ref 36.0–46.0)
Hemoglobin: 13.3 g/dL (ref 12.0–15.0)
MCHC: 34.1 g/dL (ref 30.0–36.0)
MCV: 92.2 fl (ref 78.0–100.0)
RDW: 17.3 % — ABNORMAL HIGH (ref 11.5–14.6)

## 2013-02-20 LAB — HEPATIC FUNCTION PANEL
ALT: 16 U/L (ref 0–35)
AST: 20 U/L (ref 0–37)
Alkaline Phosphatase: 49 U/L (ref 39–117)
Bilirubin, Direct: 0 mg/dL (ref 0.0–0.3)
Total Bilirubin: 0.5 mg/dL (ref 0.3–1.2)
Total Protein: 7.5 g/dL (ref 6.0–8.3)

## 2013-02-25 ENCOUNTER — Telehealth: Payer: Self-pay | Admitting: Internal Medicine

## 2013-02-25 NOTE — Telephone Encounter (Signed)
error 

## 2013-03-05 ENCOUNTER — Ambulatory Visit (INDEPENDENT_AMBULATORY_CARE_PROVIDER_SITE_OTHER): Payer: 59 | Admitting: Internal Medicine

## 2013-03-05 ENCOUNTER — Encounter: Payer: Self-pay | Admitting: Internal Medicine

## 2013-03-05 VITALS — BP 133/81 | HR 90 | Temp 97.8°F | Ht 67.0 in | Wt 116.0 lb

## 2013-03-05 DIAGNOSIS — R110 Nausea: Secondary | ICD-10-CM

## 2013-03-05 DIAGNOSIS — A310 Pulmonary mycobacterial infection: Secondary | ICD-10-CM

## 2013-03-05 MED ORDER — ONDANSETRON HCL 4 MG PO TABS: 4.0000 mg | ORAL_TABLET | Freq: Three times a day (TID) | ORAL | Status: DC | PRN

## 2013-03-05 NOTE — Assessment & Plan Note (Signed)
She is doing well with her current therapy despite the nausea. It seems to be helping her improve a little bit and I did discuss with her at length that the expectation is that she will have less decline rather than marked improvement in her lung capacity. I encouraged her to continue to stay away from cigarettes. She did get recent labs which are good and she will get labs again in 3 months from her primary provider and also she can get a sputum for AFB smear and culture at that time.

## 2013-03-05 NOTE — Progress Notes (Signed)
  Subjective:    Patient ID: [name] [name], female    DOB: 1956-03-01, 57 y.o.   MRN: [id number]  HPI  He comes in for followup of her mycobacterial kansasii infection. She continues on INH, ethambutol, and rifampin. She is experiencing some nausea associated with the medications. She has had no fever. She does continue to have shortness of breath though some increased opacity of exercise with her walking, she does have some rib pain, that is pleuritic in nature, and has had some weight loss she attributes to not eating too to stress at home.  Review of Systems  Constitutional: Positive for appetite change and fatigue. Negative for fever and unexpected weight change.  HENT: Negative for sore throat and trouble swallowing.   Eyes: Negative for visual disturbance.  Gastrointestinal: Positive for nausea. Negative for abdominal pain and diarrhea.  Skin: Negative for rash.  Neurological: Negative for dizziness and headaches.       Objective:   Physical Exam  Constitutional:  Chronically ill-appearing, improved though since last visit  Cardiovascular: Normal rate, regular rhythm and normal heart sounds.  Exam reveals no gallop and no friction rub.   No murmur heard. Pulmonary/Chest:  Distant sounding, no wheezes, no stridor.  Skin: No rash noted.          Assessment & Plan:

## 2013-03-05 NOTE — Assessment & Plan Note (Signed)
I will give her some Zofran to use as need

## 2013-03-12 ENCOUNTER — Telehealth: Payer: Self-pay | Admitting: Internal Medicine

## 2013-03-12 ENCOUNTER — Encounter: Payer: Self-pay | Admitting: Internal Medicine

## 2013-03-12 NOTE — Telephone Encounter (Signed)
Ok to refill? Last OV 3.18.14

## 2013-03-12 NOTE — Telephone Encounter (Signed)
RF done  (On TB meds, last LFTs normal)

## 2013-03-13 ENCOUNTER — Other Ambulatory Visit: Payer: Self-pay | Admitting: Internal Medicine

## 2013-03-13 ENCOUNTER — Other Ambulatory Visit: Payer: Self-pay | Admitting: *Deleted

## 2013-03-13 MED ORDER — HYDROCODONE-ACETAMINOPHEN 10-325 MG PO TABS: 1.0000 | ORAL_TABLET | Freq: Four times a day (QID) | ORAL | Status: DC | PRN

## 2013-03-13 NOTE — Telephone Encounter (Signed)
Refill request sent in error.

## 2013-03-25 ENCOUNTER — Encounter: Payer: Self-pay | Admitting: Physician Assistant

## 2013-04-26 ENCOUNTER — Telehealth: Payer: Self-pay | Admitting: Internal Medicine

## 2013-04-26 NOTE — Telephone Encounter (Signed)
Ok to refill? Last OV 3.18.14 Last filled 4.8.14

## 2013-04-26 NOTE — Telephone Encounter (Signed)
done

## 2013-06-04 ENCOUNTER — Encounter: Payer: Self-pay | Admitting: Internal Medicine

## 2013-06-04 ENCOUNTER — Ambulatory Visit (INDEPENDENT_AMBULATORY_CARE_PROVIDER_SITE_OTHER): Payer: 59 | Admitting: Internal Medicine

## 2013-06-04 VITALS — BP 139/88 | HR 102 | Temp 98.3°F | Ht 67.0 in | Wt 111.0 lb

## 2013-06-04 DIAGNOSIS — A310 Pulmonary mycobacterial infection: Secondary | ICD-10-CM

## 2013-06-04 NOTE — Assessment & Plan Note (Signed)
She has decided to stop therapy and is not interested in restarting. She did ask about prognosis however I told her that it is difficult to tell and likely that 6 months of therapy is not going to be effective and likely will have decline of her pulmonary function tests without further treatment in addition to the smoking and likely prognosis will be poor over time. I did tell her she can return at any time she is interested in restarting treatment and I would be happy to have her continue. I also reminded her she should get a pulmonologist to help monitor her lung function. I told her there is no definitive test to tell me that she has made progress with this infection.   She will return on a p.r.n. basis

## 2013-06-04 NOTE — Progress Notes (Signed)
  Subjective:    Patient ID: [name] [name], female    DOB: 11-30-1956, 57 y.o.   MRN: [id number]  HPI Comes in here for followup of her mycobacterial kansasii infection. She actually comes in and tells me that she has stopped her therapy even though it has only been 6 months. She feels she was having side effects to the medication including difficulty concentrating and confusion and she notes after stopping the therapy that she feels better. She is now been off for about a month. She also started smoking again because she felt that the problems related to confusion may also have been exacerbated by not smoking. She has had a low-grade fever in May but otherwise doesn't have any significant complaints. She does ask about prognosis.   Review of Systems  Constitutional: Positive for unexpected weight change. Negative for fever, chills and fatigue.  Respiratory: Positive for cough. Negative for shortness of breath.   Gastrointestinal: Negative for nausea, abdominal pain and diarrhea.  Skin: Negative for rash.  Neurological: Negative for dizziness and headaches.       Objective:   Physical Exam  Constitutional: She appears well-developed and well-nourished. No distress.  Skin: No rash noted.  Psychiatric: She has a normal mood and affect. Her behavior is normal.          Assessment & Plan:

## 2013-06-24 ENCOUNTER — Telehealth: Payer: Self-pay | Admitting: Internal Medicine

## 2013-06-25 ENCOUNTER — Telehealth: Payer: Self-pay | Admitting: *Deleted

## 2013-06-25 NOTE — Telephone Encounter (Signed)
Duplicate encounter

## 2013-06-25 NOTE — Telephone Encounter (Signed)
Controlled substance contract on file. rx faxed to pharmacy.

## 2013-06-25 NOTE — Telephone Encounter (Signed)
UDS low risk 02-2013 Ok RF Please be sure she has a controlled substance contract in place

## 2013-06-25 NOTE — Telephone Encounter (Signed)
Ok to refill? Last OV 3.18.14 Last filled 5.23.14 #60 with 1 RF

## 2013-08-02 ENCOUNTER — Ambulatory Visit: Payer: 59 | Admitting: Internal Medicine

## 2013-08-02 ENCOUNTER — Ambulatory Visit: Payer: 59 | Admitting: Nurse Practitioner

## 2013-08-27 ENCOUNTER — Encounter: Payer: Self-pay | Admitting: Internal Medicine

## 2013-08-27 ENCOUNTER — Ambulatory Visit (INDEPENDENT_AMBULATORY_CARE_PROVIDER_SITE_OTHER): Payer: 59 | Admitting: Internal Medicine

## 2013-08-27 VITALS — BP 127/82 | HR 76 | Temp 98.8°F | Wt 106.0 lb

## 2013-08-27 DIAGNOSIS — Z0000 Encounter for general adult medical examination without abnormal findings: Secondary | ICD-10-CM

## 2013-08-27 DIAGNOSIS — A310 Pulmonary mycobacterial infection: Secondary | ICD-10-CM

## 2013-08-27 DIAGNOSIS — E538 Deficiency of other specified B group vitamins: Secondary | ICD-10-CM

## 2013-08-27 DIAGNOSIS — F17200 Nicotine dependence, unspecified, uncomplicated: Secondary | ICD-10-CM

## 2013-08-27 DIAGNOSIS — R634 Abnormal weight loss: Secondary | ICD-10-CM

## 2013-08-27 DIAGNOSIS — Z23 Encounter for immunization: Secondary | ICD-10-CM

## 2013-08-27 LAB — BASIC METABOLIC PANEL
BUN: 9 mg/dL (ref 6–23)
Creatinine, Ser: 0.8 mg/dL (ref 0.4–1.2)
GFR: 85.16 mL/min (ref >60.00)
Glucose, Bld: 94 mg/dL (ref 70–99)
Potassium: 3.6 mEq/L (ref 3.5–5.1)

## 2013-08-27 NOTE — Assessment & Plan Note (Signed)
Ongoing issue, I again reminded her risks of cancer, vascular events, emphysema

## 2013-08-27 NOTE — Progress Notes (Signed)
Subjective:    Patient ID: [name] [name], female    DOB: 05/14/1956, 57 y.o.   MRN: [id number]  HPI Here to discuss the following: Reports some confusion , "brain caos" when she was taken antibiotics for the mycobacterium infection, she discontinued them and 10 days later she felt back to normal. Note from ID reviewed, risk of stopping antibiotics was discussed. Continue with weight loss, currently 106. Continue with pain mostly at the knees, right hip, right chest. "Pain medication is not working as well as before".  Past Medical History  Diagnosis Date  . COPD (chronic obstructive pulmonary disease)     per CT 11/10/05, will minimal bronchiectasis rll  . GERD (gastroesophageal reflux disease)   . Osteoporosis     dexa per gyn  . 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 Headache(784.0)   . Lichen planus   . B12 deficiency   . Endometriosis   . Cataracts, bilateral   . Arthritis   . Lung mass   . Asthma     .  years ago- has not had problem for years.  . Pneumonia     last ime 2008  . Hemorrhoid   . Pneumothorax, spontaneous, tension     x 2 in her 20's.  has a chest tube for one  . 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 Knee surgery      .not replacement .  Marland Kitchen Ovarian cyst removed    . Video bronchoscopy  02/15/2012    Procedure: VIDEO BRONCHOSCOPY WITH FLUORO;  Surgeon: Nyoka Cowden, MD;  Location: [facility];  Service: Endoscopy;  Laterality: Bilateral;  WASHING- INTERVENTION BIOPSIES INTERVENTION BRONCHOSCOPY WITH VIDEO  . Mediastinoscopy  05/25/2012    Procedure: MEDIASTINOSCOPY;  Surgeon: Alleen Borne, MD;  Location: [facility] OR;  Service: Thoracic;  Laterality: N/A;  . Tonsillectomy    . Flexible bronchoscopy  11/05/2012    Procedure: FLEXIBLE BRONCHOSCOPY;  Surgeon: Alleen Borne, MD;  Location: [facility] OR;  Service: Thoracic;  Laterality: N/A;  . Thoracotomy   11/05/2012    Procedure: THORACOTOMY MAJOR;  Surgeon: Alleen Borne, MD;  Location: [facility] OR;  Service: Thoracic;  Laterality: Right;  . Lobectomy  11/05/2012    Procedure: LOBECTOMY;  Surgeon: Alleen Borne, MD;  Location: [facility] OR;  Service: Thoracic;  Laterality: Right;  right upper lobectomy  . Esophagogastroduodenoscopy  12/03/2012    Procedure: ESOPHAGOGASTRODUODENOSCOPY (EGD);  Surgeon: Hart Carwin, MD;  Location: [facility] ENDOSCOPY;  Service: Endoscopy;  Laterality: N/A;   History   Social History  . Marital Status: Single    Spouse Name: N/A    Number of Children: 0  . Years of Education: N/A   Occupational History  . HR Coordinator      [employer]    Social History Main Topics  . Smoking status: Current Every Day Smoker -- 0.50 packs/day for 38 years    Types: Cigarettes  . Smokeless tobacco: Never Used  . Alcohol Use: 0.0 oz/week     Comment: wine occ  . Drug Use: No  . Sexual Activity: Not on file   Other Topics Concern  . Not on file   Social History Narrative  . No narrative on file     Review of Systems  Reports occasional low-grade fever on and off, no chills. No nausea, vomiting, diarrhea or blood in the  stools. Shortness of breath at baseline, denies cough or hemoptysis. Mother is in hospice, occasional feels depressed, denies any suicidal ideas. She clearly said "I will never take an antidepressant again".     Objective:   Physical Exam BP 127/82  Pulse 76  Temp(Src) 98.8 F (37.1 C)  Wt 106 lb (48.081 kg)  BMI 16.6 kg/m2  SpO2 100%  LMP 11/05/2004  General -- alert, underwt appearing, NAD.  Neck --no thyromegaly ,  no LAD Lungs -- normal respiratory effort, no intercostal retractions, no accessory muscle use, and normal breath sounds.  Heart-- Decreased breath sounds Abdomen-- Not distended, good bowel sounds,soft, non-tender. Extremities-- no pretibial edema bilaterally  Neurologic--  alert & oriented X3.   Psych-- Cognition and judgment appear  intact. Cooperative with normal attention span and concentration. No anxious appearing , no depressed appearing.       Assessment & Plan:

## 2013-08-27 NOTE — Assessment & Plan Note (Addendum)
Ongoing issue, check a BMP, A1c and a TSH. Multifactorial?. Could be due to subtle depression----> performed PHQ 9: scored2 thus no depression d/t infection?

## 2013-08-27 NOTE — Assessment & Plan Note (Signed)
Labs

## 2013-08-27 NOTE — Assessment & Plan Note (Addendum)
Pt decided not to take antibiotics due to the perceived confusion. She was disappointed about ID response to her decision .  I point out that often times she make her own decisions based on her beliefs and we as doctors  have to respect them but not necessarily "aprove" or help her justify her decision . I think is important for her to  Complete her treatment, recommend to see ID again

## 2013-08-27 NOTE — Assessment & Plan Note (Signed)
Recommend to have a physical exam on return to the office. Very reluctant to see gynecology,

## 2013-08-28 ENCOUNTER — Encounter: Payer: Self-pay | Admitting: Internal Medicine

## 2013-08-28 ENCOUNTER — Ambulatory Visit: Payer: 59

## 2013-08-28 DIAGNOSIS — R946 Abnormal results of thyroid function studies: Secondary | ICD-10-CM

## 2013-08-28 LAB — T3, FREE: T3, Free: 2.9 pg/mL (ref 2.3–4.2)

## 2013-09-03 ENCOUNTER — Ambulatory Visit
Admission: RE | Admit: 2013-09-03 | Discharge: 2013-09-03 | Disposition: A | Discharge status: Home / Self Care | Payer: 59 | Source: Ambulatory Visit | Attending: Internal Medicine | Admitting: Internal Medicine

## 2013-09-03 DIAGNOSIS — A310 Pulmonary mycobacterial infection: Secondary | ICD-10-CM

## 2013-09-11 ENCOUNTER — Telehealth: Payer: Self-pay | Admitting: *Deleted

## 2013-09-11 MED ORDER — ALPRAZOLAM 0.5 MG PO TABS: 0.2500 mg | ORAL_TABLET | Freq: Every evening | ORAL | Status: DC | PRN

## 2013-09-11 NOTE — Telephone Encounter (Signed)
Done

## 2013-09-11 NOTE — Telephone Encounter (Signed)
Pt would like a refill on Xanax  Last visit-08/27/2013 Last filled -12/06/2012 #90, 1 Refill UDS-02/19/2013-low risk, contract signed  Please advise. SW, CMA

## 2013-10-16 ENCOUNTER — Emergency Department (HOSPITAL_COMMUNITY): Payer: PRIVATE HEALTH INSURANCE

## 2013-10-16 ENCOUNTER — Encounter (HOSPITAL_COMMUNITY): Payer: Self-pay | Admitting: Emergency Medicine

## 2013-10-16 ENCOUNTER — Inpatient Hospital Stay (HOSPITAL_COMMUNITY)
Admission: EM | Admit: 2013-10-16 | Discharge: 2013-10-19 | DRG: 536 | Disposition: A | Discharge status: Home Health | Payer: PRIVATE HEALTH INSURANCE | Attending: Internal Medicine | Admitting: Internal Medicine

## 2013-10-16 DIAGNOSIS — Z87898 Personal history of other specified conditions: Secondary | ICD-10-CM

## 2013-10-16 DIAGNOSIS — N809 Endometriosis, unspecified: Secondary | ICD-10-CM

## 2013-10-16 DIAGNOSIS — IMO0002 Reserved for concepts with insufficient information to code with codable children: Secondary | ICD-10-CM | POA: Diagnosis present

## 2013-10-16 DIAGNOSIS — J45909 Unspecified asthma, uncomplicated: Secondary | ICD-10-CM

## 2013-10-16 DIAGNOSIS — D649 Anemia, unspecified: Secondary | ICD-10-CM

## 2013-10-16 DIAGNOSIS — F17200 Nicotine dependence, unspecified, uncomplicated: Secondary | ICD-10-CM

## 2013-10-16 DIAGNOSIS — R1310 Dysphagia, unspecified: Secondary | ICD-10-CM

## 2013-10-16 DIAGNOSIS — R1319 Other dysphagia: Secondary | ICD-10-CM

## 2013-10-16 DIAGNOSIS — S43004A Unspecified dislocation of right shoulder joint, initial encounter: Secondary | ICD-10-CM

## 2013-10-16 DIAGNOSIS — Y9289 Other specified places as the place of occurrence of the external cause: Secondary | ICD-10-CM

## 2013-10-16 DIAGNOSIS — Z833 Family history of diabetes mellitus: Secondary | ICD-10-CM

## 2013-10-16 DIAGNOSIS — K7689 Other specified diseases of liver: Secondary | ICD-10-CM

## 2013-10-16 DIAGNOSIS — F419 Anxiety disorder, unspecified: Secondary | ICD-10-CM

## 2013-10-16 DIAGNOSIS — R002 Palpitations: Secondary | ICD-10-CM

## 2013-10-16 DIAGNOSIS — A310 Pulmonary mycobacterial infection: Secondary | ICD-10-CM

## 2013-10-16 DIAGNOSIS — Z8249 Family history of ischemic heart disease and other diseases of the circulatory system: Secondary | ICD-10-CM

## 2013-10-16 DIAGNOSIS — S72109A Unspecified trochanteric fracture of unspecified femur, initial encounter for closed fracture: Principal | ICD-10-CM | POA: Diagnosis present

## 2013-10-16 DIAGNOSIS — J479 Bronchiectasis, uncomplicated: Secondary | ICD-10-CM

## 2013-10-16 DIAGNOSIS — K219 Gastro-esophageal reflux disease without esophagitis: Secondary | ICD-10-CM

## 2013-10-16 DIAGNOSIS — J449 Chronic obstructive pulmonary disease, unspecified: Secondary | ICD-10-CM

## 2013-10-16 DIAGNOSIS — R197 Diarrhea, unspecified: Secondary | ICD-10-CM

## 2013-10-16 DIAGNOSIS — S72102A Unspecified trochanteric fracture of left femur, initial encounter for closed fracture: Secondary | ICD-10-CM

## 2013-10-16 DIAGNOSIS — Z7982 Long term (current) use of aspirin: Secondary | ICD-10-CM

## 2013-10-16 DIAGNOSIS — W010XXA Fall on same level from slipping, tripping and stumbling without subsequent striking against object, initial encounter: Secondary | ICD-10-CM | POA: Diagnosis present

## 2013-10-16 DIAGNOSIS — M810 Age-related osteoporosis without current pathological fracture: Secondary | ICD-10-CM

## 2013-10-16 DIAGNOSIS — J4489 Other specified chronic obstructive pulmonary disease: Secondary | ICD-10-CM

## 2013-10-16 DIAGNOSIS — E538 Deficiency of other specified B group vitamins: Secondary | ICD-10-CM

## 2013-10-16 DIAGNOSIS — R110 Nausea: Secondary | ICD-10-CM

## 2013-10-16 DIAGNOSIS — M1710 Unilateral primary osteoarthritis, unspecified knee: Secondary | ICD-10-CM | POA: Diagnosis present

## 2013-10-16 DIAGNOSIS — S43006A Unspecified dislocation of unspecified shoulder joint, initial encounter: Secondary | ICD-10-CM | POA: Diagnosis present

## 2013-10-16 DIAGNOSIS — R509 Fever, unspecified: Secondary | ICD-10-CM

## 2013-10-16 DIAGNOSIS — R634 Abnormal weight loss: Secondary | ICD-10-CM

## 2013-10-16 DIAGNOSIS — Z902 Acquired absence of lung [part of]: Secondary | ICD-10-CM

## 2013-10-16 DIAGNOSIS — Z0000 Encounter for general adult medical examination without abnormal findings: Secondary | ICD-10-CM

## 2013-10-16 DIAGNOSIS — M1990 Unspecified osteoarthritis, unspecified site: Secondary | ICD-10-CM

## 2013-10-16 DIAGNOSIS — F411 Generalized anxiety disorder: Secondary | ICD-10-CM | POA: Diagnosis present

## 2013-10-16 DIAGNOSIS — L439 Lichen planus, unspecified: Secondary | ICD-10-CM

## 2013-10-16 DIAGNOSIS — G4700 Insomnia, unspecified: Secondary | ICD-10-CM

## 2013-10-16 DIAGNOSIS — K559 Vascular disorder of intestine, unspecified: Secondary | ICD-10-CM

## 2013-10-16 MED ORDER — HYDROMORPHONE HCL PF 1 MG/ML IJ SOLN
1.0000 mg | Freq: Once | INTRAMUSCULAR | Status: AC
  Administered 2013-10-16: 1 mg via INTRAMUSCULAR
  Filled 2013-10-16: qty 1

## 2013-10-16 MED ORDER — DIAZEPAM 5 MG/ML IJ SOLN
5.0000 mg | Freq: Once | INTRAMUSCULAR | Status: AC
  Administered 2013-10-16: 5 mg via INTRAVENOUS
  Filled 2013-10-16: qty 2

## 2013-10-16 MED ORDER — HYDROMORPHONE HCL PF 1 MG/ML IJ SOLN
0.5000 mg | Freq: Once | INTRAMUSCULAR | Status: AC
  Administered 2013-10-16: 0.5 mg via INTRAVENOUS

## 2013-10-16 MED ORDER — HYDROMORPHONE HCL PF 1 MG/ML IJ SOLN
INTRAMUSCULAR | Status: AC
  Filled 2013-10-16: qty 1

## 2013-10-16 NOTE — ED Notes (Signed)
Pt. Transferred into stretcher via 2 nurses.  Pt.un Able to help with undressing.  All personal belongings in a personal bag.

## 2013-10-16 NOTE — ED Notes (Signed)
Patient transported to X-ray 

## 2013-10-16 NOTE — ED Provider Notes (Signed)
CSN: [id number]     Arrival date & time 10/16/13  1728 History  This chart was scribed for non-physician practitioner Dierdre Forth, PA-C working with Junius Argyle, MD by Valera Castle, ED scribe. This patient was seen in room TR11C/TR11C and the patient's care was started at 7:27 PM.  Chief Complaint  Patient presents with  . Fall   The history is provided by the patient and medical records. No language interpreter was used.   HPI Comments: [name] [name] is a 57 y.o. female who presents to the Emergency Department complaining of fall earlier today when she tripped over a piece of concrete while walking in the [facility] parking lot. 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 She reports not being able to move after falling, calling out for help, and states that a lady from [facility] came and brought her to the ED. She reports sudden, moderate, constant, left hip pain, left knee pain, left elbow pain, and right shoulder pain. She states her left hip pain feels like a muscle spasm, that comes and goes with movement. She states that initially her right shoulder pain felt the worse, but that pain has since decreased, and states that her left hip pain is currently the worst. She denies hitting her head, LOC, neck pain, back pain, chest pain, and any other associated symptoms. 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 She reports quitting the antibiotics due to side effects. She denies knowing if she has any other bacterial complications. She reports having osteoarthritis in bilateral knees, with multiple knee surgeries.   PCP -Willow Ora, MD  Past Medical History  Diagnosis Date  . COPD (chronic obstructive pulmonary disease)     per CT 11/10/05, will minimal bronchiectasis rll  . GERD (gastroesophageal reflux disease)   . Osteoporosis     dexa per gyn  . Colitis     induced by  decongestants, NSAIds  . Headache(784.0)   . Lichen planus   . B12 deficiency   . Endometriosis   . Cataracts, bilateral   . Arthritis   . Lung mass   . Asthma     .  years ago- has not had problem for years.  . Pneumonia     last ime 2008  . Hemorrhoid   . Pneumothorax, spontaneous, tension     x 2 in her 20's.  has a chest tube for one  . 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 Knee surgery      .not replacement .  Marland Kitchen Ovarian cyst removed    . Video bronchoscopy  02/15/2012    Procedure: VIDEO BRONCHOSCOPY WITH FLUORO;  Surgeon: Nyoka Cowden, MD;  Location: [facility] ENDOSCOPY;  Service: Endoscopy;  Laterality: Bilateral;  WASHING- INTERVENTION BIOPSIES INTERVENTION BRONCHOSCOPY WITH VIDEO  . Mediastinoscopy  05/25/2012    Procedure: MEDIASTINOSCOPY;  Surgeon: Alleen Borne, MD;  Location: [facility] OR;  Service: Thoracic;  Laterality: N/A;  . Tonsillectomy    . Flexible bronchoscopy  11/05/2012    Procedure: FLEXIBLE BRONCHOSCOPY;  Surgeon: Alleen Borne, MD;  Location: [facility] OR;  Service: Thoracic;  Laterality: N/A;  . Thoracotomy  11/05/2012    Procedure: THORACOTOMY MAJOR;  Surgeon: Alleen Borne, MD;  Location: [facility] OR;  Service: Thoracic;  Laterality: Right;  . Lobectomy  11/05/2012    Procedure: LOBECTOMY;  Surgeon: Alleen Borne, MD;  Location: [facility] OR;  Service: Thoracic;  Laterality: Right;  right upper lobectomy  . Esophagogastroduodenoscopy  12/03/2012    Procedure: ESOPHAGOGASTRODUODENOSCOPY (EGD);  Surgeon: Hart Carwin, MD;  Location: [facility] ENDOSCOPY;  Service: Endoscopy;  Laterality: N/A;   Family History  Problem Relation Age of Onset  . Colon cancer Neg Hx   . Breast cancer Neg Hx   . Diabetes Mother     GM  . Heart attack      ?  Marland Kitchen Heart disease Mother   . Heart disease Maternal Grandfather   . Cancer Father     sarcoma   History  Substance Use Topics  . Smoking status:  Current Every Day Smoker -- 0.50 packs/day for 38 years    Types: Cigarettes  . Smokeless tobacco: Never Used  . Alcohol Use: 0.0 oz/week     Comment: wine occ   OB History   Grav Para Term Preterm Abortions TAB SAB Ect Mult Living                 Review of Systems  Constitutional: Negative for fever and fatigue.  Respiratory: Negative for chest tightness and shortness of breath.   Cardiovascular: Negative for chest pain.  Gastrointestinal: Negative for nausea, vomiting, abdominal pain and diarrhea.  Genitourinary: Negative for dysuria, urgency, frequency and hematuria.  Musculoskeletal: Positive for arthralgias (right shoulder, left knee, left hip, left elbow) and gait problem ( 2/2 pain). Negative for back pain, joint swelling, neck pain and neck stiffness.  Skin: Positive for wound (abrasion over left elbow, left knee). Negative for rash.  Neurological: Negative for syncope, weakness, light-headedness, numbness and headaches.  All other systems reviewed and are negative.   Allergies  Alendronate sodium; Antihistamines, chlorpheniramine-type; Benadryl; Nsaids; Other; Pseudoephedrine; Robitussin a-c; and Levofloxacin  Home Medications   Current Outpatient Rx  Name  Route  Sig  Dispense  Refill  . acetaminophen (TYLENOL) 500 MG tablet   Oral   Take 500 mg by mouth every 4 (four) hours as needed for moderate pain or fever. For fever         . ALPRAZolam (XANAX) 0.5 MG tablet   Oral   Take 0.5-1 tablets (0.25-0.5 mg total) by mouth at bedtime as needed. For insomnia.   90 tablet   1   . aspirin 81 MG chewable tablet   Oral   Chew 81 mg by mouth daily.         . calcium carbonate (OS-CAL) 600 MG TABS tablet   Oral   Take 600 mg by mouth daily with breakfast.         . HYDROcodone-acetaminophen (NORCO) 10-325 MG per tablet      TAKE 1 TABLET BY MOUTH EVERY 6 HOURS AS NEEDED FOR PAIN   60 tablet   3   . Magnesium 65 MG TABS   Oral   Take 65 mg by mouth  daily.         . nicotine polacrilex (NICORETTE) 4 MG lozenge   Buccal   Place 4 mg inside cheek every 3 (three) hours as needed. For nicotine cravings.         . ondansetron (ZOFRAN) 4 MG tablet   Oral   Take 1 tablet (4 mg total) by mouth every 8 (eight) hours as needed for nausea.   30 tablet   5   . pantoprazole (PROTONIX) 40 MG tablet  Oral   Take 40 mg by mouth daily.         . Zinc 50 MG TABS   Oral   Take 50 mg by mouth daily.          Triage Vitals: BP 126/89  Pulse 86  Temp(Src) 97.8 F (36.6 C) (Oral)  Resp 18  SpO2 97%  LMP 11/05/2004  Physical Exam  Nursing note and vitals reviewed. Constitutional: She is oriented to person, place, and time. She appears well-developed and well-nourished. No distress.  Awake, alert, nontoxic appearance  HENT:  Head: Normocephalic and atraumatic.  Mouth/Throat: Oropharynx is clear and moist. No oropharyngeal exudate.  Eyes: Conjunctivae and EOM are normal. Pupils are equal, round, and reactive to light. No scleral icterus.  Neck: Normal range of motion. Neck supple.  Full ROM without pain  Cardiovascular: Normal rate, regular rhythm, normal heart sounds and intact distal pulses.   No murmur heard. Pulmonary/Chest: Effort normal and breath sounds normal. No respiratory distress. She has no wheezes.  Abdominal: Soft. Bowel sounds are normal. She exhibits no distension. There is no tenderness.  Musculoskeletal: Normal range of motion. She exhibits tenderness. She exhibits no edema.  Pain to palpation over left greater trochanter. Patellar tendon intact, but Limited ROM of the Left hip 2/2 pain. Limited ROM of right shoulder due to pain.  Lymphadenopathy:    She has no cervical adenopathy.  Neurological: She is alert and oriented to person, place, and time. She has normal reflexes. No cranial nerve deficit. Coordination normal.  Speech is clear and goal oriented, follows commands Normal strength in upper extremities  bilaterally including  strong and equal grip strength Sensation normal to light and sharp touch Moves extremities without ataxia, coordination intact Unable to ambulate  Skin: Skin is warm and dry. No rash noted. She is not diaphoretic. No erythema.  Abrasion to left knee and left elbow noted.  Psychiatric: She has a normal mood and affect. Her behavior is normal.    ED Course  Procedures (including critical care time)  DIAGNOSTIC STUDIES: Oxygen Saturation is 97% on room air, normal by my interpretation.    COORDINATION OF CARE: 7:36 PM-Discussed treatment plan with pt at bedside and pt agreed to plan. Will discuss imaging results with pt.   Labs Review Labs Reviewed  CBC  COMPREHENSIVE METABOLIC PANEL   Imaging Review Dg Shoulder Right  10/16/2013   CLINICAL DATA:  Larey Seat.  Shoulder pain.  EXAM: RIGHT SHOULDER - 2+ VIEW  COMPARISON:  None.  FINDINGS: Findings suspicious for a posterior dislocation. No acute fractures identified. The right lung is clear. Surgical changes are noted. The [facility] joint is intact.  IMPRESSION: Suspect posterior dislocation.  No definite fracture.   Electronically Signed   By: Loralie Champagne M.D.   On: 10/16/2013 19:23   Dg Hip Complete Left  10/16/2013   CLINICAL DATA:  Left hip pain  EXAM: LEFT HIP - COMPLETE 2+ VIEW  COMPARISON:  None.  FINDINGS: There is an avulsion fracture of the greater trochanter identified with only minimal displacement medially. No other fracture is seen. The pelvic ring is intact. No gross soft tissue abnormality is noted.  IMPRESSION: Avulsion fracture of the greater trochanter on the left proximal femur.   Electronically Signed   By: Alcide Clever M.D.   On: 10/16/2013 19:13   Dg Knee Complete 4 Views Left  10/16/2013   CLINICAL DATA:  Left knee pain  EXAM: LEFT KNEE - COMPLETE 4+ VIEW  COMPARISON:  None.  FINDINGS: No acute fracture or dislocation is noted. Mild calcification of the menisci is seen. No joint effusion is noted.   IMPRESSION: No acute abnormality is noted.   Electronically Signed   By: Alcide Clever M.D.   On: 10/16/2013 19:12    EKG Interpretation   None       MDM   1. Avulsion fracture of trochanter of femur, left, closed, initial encounter   2. Shoulder dislocation, right, initial encounter     SHALIMAR MCCLAIN presents after fall in the parking lot.  Pt unable to ambulate initially 2/2 pain.  Pt without pain to the groin, but TTP of the left greater trochanter.  Pt also with pain and "spasm" to the right shoulder with movement.  Several abrasions noted to the left knee and elbow.   Will obtain imaging.    Initial x-rays indicate probable posterior dislocation of the right shoulder however patient exam with moderate range of motion is not consistent with dislocation.  Will obtain a dedicated scapular Y view.    L hip x-ray with avulsion fracture of the greater trochanter on the left proximal femur.  I personally reviewed the imaging tests through PACS system.  I reviewed available ER/hospitalization records through the EMR.    Pt is unable to ambulate due to pain.  Will CT left hip.  12:12 AM Hip CT with Mildly comminuted mildly displaced avulsion fracture involving the left greater femoral trochanter, but No definite femoral neck fracture seen.  Pt remains unable to ambulate due to pain.  Will redose pain control and have [facility] admit.    12:31 AM Discussed with Dr Victorino Dike who agrees to evaluate the patient in the morning as an official consult.  Labs pending.  Discussed with Dr Toniann Fail of [facility] who will admit with ortho consult.      Dahlia Client Felisa Zechman, PA-C 10/17/13 [phone]

## 2013-10-16 NOTE — ED Notes (Signed)
Pt sts tripped and fell on way out from work today; pt c/o right shoulder pain; left knee, hip and elbow pain; abrasions noted to elbow and knee; pt denies hitting head or LOC

## 2013-10-16 NOTE — ED Notes (Signed)
Pt. Returned from xray 

## 2013-10-17 ENCOUNTER — Encounter (HOSPITAL_COMMUNITY): Payer: Self-pay | Admitting: *Deleted

## 2013-10-17 DIAGNOSIS — F419 Anxiety disorder, unspecified: Secondary | ICD-10-CM | POA: Diagnosis present

## 2013-10-17 DIAGNOSIS — S72143A Displaced intertrochanteric fracture of unspecified femur, initial encounter for closed fracture: Secondary | ICD-10-CM | POA: Insufficient documentation

## 2013-10-17 DIAGNOSIS — S72109A Unspecified trochanteric fracture of unspecified femur, initial encounter for closed fracture: Secondary | ICD-10-CM

## 2013-10-17 DIAGNOSIS — S43006A Unspecified dislocation of unspecified shoulder joint, initial encounter: Secondary | ICD-10-CM

## 2013-10-17 LAB — CBC
HCT: 35.2 % — ABNORMAL LOW (ref 36.0–46.0)
Hemoglobin: 12.2 g/dL (ref 12.0–15.0)
MCH: 35.1 pg — ABNORMAL HIGH (ref 26.0–34.0)
MCHC: 34.7 g/dL (ref 30.0–36.0)
MCV: 101.1 fL — ABNORMAL HIGH (ref 78.0–100.0)
Platelets: 321 10*3/uL (ref 150–400)
RBC: 3.48 MIL/uL — ABNORMAL LOW (ref 3.87–5.11)
RDW: 12.9 % (ref 11.5–15.5)
WBC: 9.8 10*3/uL (ref 4.0–10.5)

## 2013-10-17 LAB — COMPREHENSIVE METABOLIC PANEL
AST: 15 U/L (ref 0–37)
Albumin: 3.6 g/dL (ref 3.5–5.2)
Alkaline Phosphatase: 44 U/L (ref 39–117)
BUN: 16 mg/dL (ref 6–23)
Chloride: 105 mEq/L (ref 96–112)
Glucose, Bld: 93 mg/dL (ref 70–99)
Potassium: 3.7 mEq/L (ref 3.5–5.1)
Sodium: 141 mEq/L (ref 135–145)
Total Bilirubin: 0.1 mg/dL — ABNORMAL LOW (ref 0.3–1.2)

## 2013-10-17 MED ORDER — SODIUM CHLORIDE 0.9 % IV SOLN
INTRAVENOUS | Status: DC
  Administered 2013-10-17: 10 mL/h via INTRAVENOUS

## 2013-10-17 MED ORDER — HYDROMORPHONE HCL PF 1 MG/ML IJ SOLN
1.0000 mg | INTRAMUSCULAR | Status: DC | PRN
  Administered 2013-10-17 – 2013-10-18 (×6): 1 mg via INTRAVENOUS
  Filled 2013-10-17 (×5): qty 1

## 2013-10-17 MED ORDER — ONDANSETRON HCL 4 MG/2ML IJ SOLN: 4.0000 mg | Freq: Three times a day (TID) | INTRAMUSCULAR | Status: DC | PRN

## 2013-10-17 MED ORDER — ONDANSETRON HCL 4 MG/2ML IJ SOLN: 4.0000 mg | Freq: Four times a day (QID) | INTRAMUSCULAR | Status: DC | PRN

## 2013-10-17 MED ORDER — BOOST / RESOURCE BREEZE PO LIQD
1.0000 | Freq: Three times a day (TID) | ORAL | Status: DC
  Administered 2013-10-17 – 2013-10-18 (×4): 1 via ORAL

## 2013-10-17 MED ORDER — ALPRAZOLAM 0.25 MG PO TABS
0.2500 mg | ORAL_TABLET | Freq: Every evening | ORAL | Status: DC | PRN
  Administered 2013-10-17 – 2013-10-18 (×2): 0.25 mg via ORAL
  Filled 2013-10-17 (×2): qty 1

## 2013-10-17 MED ORDER — ONDANSETRON HCL 4 MG PO TABS: 4.0000 mg | ORAL_TABLET | Freq: Four times a day (QID) | ORAL | Status: DC | PRN

## 2013-10-17 MED ORDER — PANTOPRAZOLE SODIUM 40 MG PO TBEC
40.0000 mg | DELAYED_RELEASE_TABLET | Freq: Every day | ORAL | Status: DC
  Administered 2013-10-17 – 2013-10-19 (×3): 40 mg via ORAL
  Filled 2013-10-17 (×3): qty 1

## 2013-10-17 MED ORDER — CALCIUM CARBONATE 1250 (500 CA) MG PO TABS
1.0000 | ORAL_TABLET | Freq: Every day | ORAL | Status: DC
  Filled 2013-10-17 (×3): qty 1

## 2013-10-17 MED ORDER — ACETAMINOPHEN 650 MG RE SUPP: 650.0000 mg | Freq: Four times a day (QID) | RECTAL | Status: DC | PRN

## 2013-10-17 MED ORDER — ASPIRIN 81 MG PO CHEW
81.0000 mg | CHEWABLE_TABLET | Freq: Every day | ORAL | Status: DC
  Administered 2013-10-17 – 2013-10-19 (×3): 81 mg via ORAL
  Filled 2013-10-17 (×3): qty 1

## 2013-10-17 MED ORDER — ZINC SULFATE 220 (50 ZN) MG PO CAPS
220.0000 mg | ORAL_CAPSULE | Freq: Every day | ORAL | Status: DC
  Filled 2013-10-17 (×3): qty 1

## 2013-10-17 MED ORDER — HYDROMORPHONE HCL PF 1 MG/ML IJ SOLN
0.5000 mg | Freq: Once | INTRAMUSCULAR | Status: AC
  Administered 2013-10-17: 0.5 mg via INTRAVENOUS
  Filled 2013-10-17: qty 1

## 2013-10-17 MED ORDER — MAGNESIUM 65 MG PO TABS: 65.0000 mg | ORAL_TABLET | Freq: Every day | ORAL | Status: DC

## 2013-10-17 MED ORDER — METHOCARBAMOL 500 MG PO TABS
500.0000 mg | ORAL_TABLET | Freq: Four times a day (QID) | ORAL | Status: DC | PRN
  Administered 2013-10-17 – 2013-10-19 (×8): 500 mg via ORAL
  Filled 2013-10-17 (×8): qty 1

## 2013-10-17 MED ORDER — CALCIUM CARBONATE 600 MG PO TABS
600.0000 mg | ORAL_TABLET | Freq: Every day | ORAL | Status: DC
  Filled 2013-10-17 (×2): qty 1

## 2013-10-17 MED ORDER — ZINC 50 MG PO TABS: 50.0000 mg | ORAL_TABLET | Freq: Every day | ORAL | Status: DC

## 2013-10-17 MED ORDER — MAGNESIUM CHLORIDE 64 MG PO TBEC
1.0000 | DELAYED_RELEASE_TABLET | Freq: Every day | ORAL | Status: DC
  Administered 2013-10-19: 64 mg via ORAL
  Filled 2013-10-17 (×3): qty 1

## 2013-10-17 MED ORDER — HYDROMORPHONE HCL PF 1 MG/ML IJ SOLN
1.0000 mg | INTRAMUSCULAR | Status: AC | PRN
  Administered 2013-10-17: 0.5 mg via INTRAVENOUS
  Administered 2013-10-17 (×3): 1 mg via INTRAVENOUS
  Filled 2013-10-17 (×5): qty 1

## 2013-10-17 MED ORDER — ACETAMINOPHEN 325 MG PO TABS
650.0000 mg | ORAL_TABLET | Freq: Four times a day (QID) | ORAL | Status: DC | PRN
  Administered 2013-10-17: 650 mg via ORAL
  Filled 2013-10-17: qty 2

## 2013-10-17 NOTE — Progress Notes (Addendum)
[facility] PROGRESS NOTE  LING FLESCH ZOX:[id number] DOB: January 21, 1956 DOA: 10/16/2013 PCP: Willow Ora, MD I have seen and examined pt who is a 57yo admitted this am by Dr Toniann Fail   with COPD, anxiety and ongoing tobacco abuse s/p fall in the Parking lot and found to have left avulsion fracture of the greater trochanter and right shoulder dislocation>>ortho consulted and per Dr Victorino Dike- non surgical, she will need pain control, PT and WBAT. Will continue current management plan as per Dr Toniann Fail, pain management and follow.   Kela Millin  [facility] Pager [phone]. If 7PM-7AM, please contact night-coverage at [email or web address], password [facility] 10/17/2013, 12:41 PM  LOS: 1 day

## 2013-10-17 NOTE — Consult Note (Signed)
Reason for Consult: Avulsion fracture proximal left hip  Referring Physician: Midge Minium, MD  [name] [name] is an 57 y.o. female.  HPI: Pt is a 57 y.o. female brought in to the Emergency Department for fall last night when she tripped over a piece of concrete while walking in the [employer] parking garage. Pt had initial pain in right shoulder and left hip.  Right shoulder xrays were reviewed and found no dislocation or acute bony abnormality present.  Left hip radiographs revealed proximal avulsion fracture of greater trochanter with no femoral neck or intertrochanteric fractures noted.  CT was ordered and confirmed avulsion fracture of GT only.  This AM pt states her left hip pain feels like a muscle spasm, that is brought on with movement. She denies hitting her head or LOC with fall.  Pt denies N/V/F/C, chest pain, SOB, calf pain or paresthesia bilaterally.     Past Medical History  Diagnosis Date  . COPD (chronic obstructive pulmonary disease)     per CT 11/10/05, will minimal bronchiectasis rll  . GERD (gastroesophageal reflux disease)   . Osteoporosis     dexa per gyn  . 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 Headache(784.0)   . Lichen planus   . B12 deficiency   . Endometriosis   . Cataracts, bilateral   . Arthritis   . Lung mass   . Asthma     .  years ago- has not had problem for years.  . Pneumonia     last ime 2008  . Hemorrhoid   . Pneumothorax, spontaneous, tension     x 2 in her 20's.  has a chest tube for one  . 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 Knee surgery      .not replacement .  Marland Kitchen Ovarian cyst removed    . Video bronchoscopy  02/15/2012    Procedure: VIDEO BRONCHOSCOPY WITH FLUORO;  Surgeon: Nyoka Cowden, MD;  Location: [facility];  Service: Endoscopy;  Laterality: Bilateral;  WASHING- INTERVENTION BIOPSIES INTERVENTION BRONCHOSCOPY WITH  VIDEO  . Mediastinoscopy  05/25/2012    Procedure: MEDIASTINOSCOPY;  Surgeon: Alleen Borne, MD;  Location: [facility] OR;  Service: Thoracic;  Laterality: N/A;  . Tonsillectomy    . Flexible bronchoscopy  11/05/2012    Procedure: FLEXIBLE BRONCHOSCOPY;  Surgeon: Alleen Borne, MD;  Location: [facility] OR;  Service: Thoracic;  Laterality: N/A;  . Thoracotomy  11/05/2012    Procedure: THORACOTOMY MAJOR;  Surgeon: Alleen Borne, MD;  Location: [facility] OR;  Service: Thoracic;  Laterality: Right;  . Lobectomy  11/05/2012    Procedure: LOBECTOMY;  Surgeon: Alleen Borne, MD;  Location: [facility] OR;  Service: Thoracic;  Laterality: Right;  right upper lobectomy  . Esophagogastroduodenoscopy  12/03/2012    Procedure: ESOPHAGOGASTRODUODENOSCOPY (EGD);  Surgeon: Hart Carwin, MD;  Location: [facility] ENDOSCOPY;  Service: Endoscopy;  Laterality: N/A;    Family History  Problem Relation Age of Onset  . Colon cancer Neg Hx   . Breast cancer Neg Hx   . Diabetes Mother     GM  . Heart attack      ?  Marland Kitchen Heart disease Mother   . Heart disease Maternal Grandfather   . Cancer Father     sarcoma    Social History:  reports that she has been smoking Cigarettes.  She has  a 19 pack-year smoking history. She has never used smokeless tobacco. She reports that she drinks alcohol. She reports that she does not use illicit drugs.  Allergies:  Allergies  Allergen Reactions  . Alendronate Sodium Other (See Comments)    REACTION: chest pain  . Antihistamines, Chlorpheniramine-Type Other (See Comments)    REACTION: "makes lungs bleed"  . Benadryl [Diphenhydramine] Other (See Comments)    REACTION: "makes lungs bleed"  . Nsaids Other (See Comments)    REACTION: ischemic colitis  . Other Other (See Comments)    All anti-inflammatories.  REACTION: ischemic colitis  . Pseudoephedrine Other (See Comments)    REACTION: Ischemic colitis  . Robitussin A-C [Guaifenesin-Codeine] Itching and Other (See Comments)    Palms itching  .  Levofloxacin Anxiety    Medications: I have reviewed the patient's current medications.  Results for orders placed during the hospital encounter of 10/16/13 (from the past 48 hour(s))  CBC     Status: Abnormal   Collection Time    10/17/13 12:30 AM      Result Value Range   WBC 9.8  4.0 - 10.5 K/uL   RBC 3.48 (*) 3.87 - 5.11 MIL/uL   Hemoglobin 12.2  12.0 - 15.0 g/dL   HCT 16.1 (*) 09.6 - 04.5 %   MCV 101.1 (*) 78.0 - 100.0 fL   MCH 35.1 (*) 26.0 - 34.0 pg   MCHC 34.7  30.0 - 36.0 g/dL   RDW 40.9  81.1 - 91.4 %   Platelets 321  150 - 400 K/uL  COMPREHENSIVE METABOLIC PANEL     Status: Abnormal   Collection Time    10/17/13 12:30 AM      Result Value Range   Sodium 141  135 - 145 mEq/L   Potassium 3.7  3.5 - 5.1 mEq/L   Chloride 105  96 - 112 mEq/L   CO2 26  19 - 32 mEq/L   Glucose, Bld 93  70 - 99 mg/dL   BUN 16  6 - 23 mg/dL   Creatinine, Ser 7.82  0.50 - 1.10 mg/dL   Calcium 8.5  8.4 - 95.6 mg/dL   Total Protein 6.4  6.0 - 8.3 g/dL   Albumin 3.6  3.5 - 5.2 g/dL   AST 15  0 - 37 U/L   ALT 12  0 - 35 U/L   Alkaline Phosphatase 44  39 - 117 U/L   Total Bilirubin 0.1 (*) 0.3 - 1.2 mg/dL   GFR calc non Af Amer >90  >90 mL/min   GFR calc Af Amer >90  >90 mL/min   Comment: (NOTE)     The eGFR has been calculated using the CKD EPI equation.     This calculation has not been validated in all clinical situations.     eGFR's persistently <90 mL/min signify possible Chronic Kidney     Disease.    Dg Shoulder Right  10/16/2013   CLINICAL DATA:  Right shoulder  EXAM: RIGHT SHOULDER - 2+ VIEW  COMPARISON:  Similar film from earlier in the same day.  FINDINGS: Humeral head appears well located. On the Y view it does appear somewhat posteriorly oriented although no true dislocation is seen. Degenerative changes of the acromioclavicular joint are noted.   Electronically Signed   By: Alcide Clever M.D.   On: 10/16/2013 22:07   Dg Shoulder Right  10/16/2013   CLINICAL DATA:  Larey Seat.   Shoulder pain.  EXAM: RIGHT SHOULDER - 2+ VIEW  COMPARISON:  None.  FINDINGS: Findings suspicious for a posterior dislocation. No acute fractures identified. The right lung is clear. Surgical changes are noted. The [facility] joint is intact.  IMPRESSION: Suspect posterior dislocation.  No definite fracture.   Electronically Signed   By: Loralie Champagne M.D.   On: 10/16/2013 19:23   Dg Hip Complete Left  10/16/2013   CLINICAL DATA:  Left hip pain  EXAM: LEFT HIP - COMPLETE 2+ VIEW  COMPARISON:  None.  FINDINGS: There is an avulsion fracture of the greater trochanter identified with only minimal displacement medially. No other fracture is seen. The pelvic ring is intact. No gross soft tissue abnormality is noted.  IMPRESSION: Avulsion fracture of the greater trochanter on the left proximal femur.   Electronically Signed   By: Alcide Clever M.D.   On: 10/16/2013 19:13   Ct Hip Left Wo Contrast  10/16/2013   CLINICAL DATA:  Status post fall; known avulsion fracture at the left hip.  EXAM: CT OF THE LEFT HIP WITHOUT CONTRAST  TECHNIQUE: Multidetector CT imaging was performed according to the standard protocol. Multiplanar CT image reconstructions were also generated.  COMPARISON:  Left hip radiographs performed earlier today at 06:38 p.m.  FINDINGS: There is a mildly comminuted mildly displaced avulsion fracture involving left greater femoral trochanter. No definite femoral neck fracture is identified, though if there is persistent significant clinical concern for occult femoral neck fracture, MRI could be considered for further evaluation. The left femoral head remains seated at the acetabulum. The left superior and inferior pubic rami are intact. The pubic symphysis is unremarkable in appearance. The left sacroiliac joint is incompletely imaged but appears grossly unremarkable.  The visualized small and large bowel are grossly unremarkable in appearance. The bladder is mildly distended and grossly unremarkable. Mild  soft tissue injury is noted overlying the left greater femoral trochanter, and about the site of avulsion injury. No significant soft tissue hematoma is seen.  IMPRESSION: 1. Mildly comminuted mildly displaced avulsion fracture involving the left greater femoral trochanter, as noted on radiograph. 2. No definite femoral neck fracture seen, though if there is significant persistent clinical concern for occult femoral neck fracture, MRI could be considered for further evaluation. 3. Mild soft tissue injury noted overlying the left greater femoral trochanter, and about the site of avulsion injury.   Electronically Signed   By: Roanna Raider M.D.   On: 10/16/2013 22:38   Dg Knee Complete 4 Views Left  10/16/2013   CLINICAL DATA:  Left knee pain  EXAM: LEFT KNEE - COMPLETE 4+ VIEW  COMPARISON:  None.  FINDINGS: No acute fracture or dislocation is noted. Mild calcification of the menisci is seen. No joint effusion is noted.  IMPRESSION: No acute abnormality is noted.   Electronically Signed   By: Alcide Clever M.D.   On: 10/16/2013 19:12    Review of Systems  Constitutional: Positive for weight loss. Negative for fever, chills, malaise/fatigue and diaphoresis.  HENT: Negative for ear discharge, ear pain, hearing loss, nosebleeds and tinnitus.   Eyes: Negative for blurred vision and double vision.  Respiratory: Negative for hemoptysis, sputum production and shortness of breath.   Cardiovascular: Negative for chest pain, palpitations and leg swelling.  Gastrointestinal: Negative for nausea, vomiting, abdominal pain, diarrhea and constipation.  Genitourinary: Negative for dysuria.  Musculoskeletal: Negative for back pain, joint pain and neck pain.  Skin: Negative for itching and rash.  Neurological: Positive for headaches. Negative for dizziness, loss of consciousness and  weakness.  Psychiatric/Behavioral: Negative for memory loss. The patient is not nervous/anxious.    Blood pressure 105/61, pulse 83,  temperature 98.4 F (36.9 C), temperature source Oral, resp. rate 20, height 5\' 6"  (1.676 m), weight 48.263 kg (106 lb 6.4 oz), last menstrual period 11/05/2004, SpO2 96.00%. Physical Exam  Nursing note and vitals reviewed. Constitutional: She is oriented to person, place, and time. She appears well-developed and well-nourished.  HENT:  Head: Normocephalic and atraumatic.  Eyes: EOM are normal.  Cardiovascular: Normal rate.   Respiratory: Effort normal. No respiratory distress.  GI: Soft.  Musculoskeletal: She exhibits tenderness.  Neurological: She is alert and oriented to person, place, and time.  Skin: No rash noted. No erythema.  Psychiatric: She has a normal mood and affect. Her behavior is normal. Judgment and thought content normal.  Pt presented sitting in bed, sensation to light touch to distal extremities intact, DP pulses 2+ bilaterally, +TTP to left GT, +muscle spasms to abductor muscle group in lateral hip with ROM exercises.  Pt unable to flex/extend/abduct/adduct more than 5 degrees before painful spasm causes resistance to movement.  Resisted abduction strengthening recreates pain.  Good ROM to left ankle, leg compartments soft.  Skin healthy and intact.  Assessment/Plan: Continue muscle relaxer and pain management Begin PT for initial evaluation and placement WBAT with assistive device as needed F/u with Dr. Victorino Dike in 2 weeks  FLOWERS, CHRISTOPHER S 10/17/2013, 8:58 AM

## 2013-10-17 NOTE — H&P (Signed)
[facility] History and Physical  [name] [name] RUE:[id number] DOB: 1956-09-25 DOA: 10/16/2013  Referring physician: ER physician. PCP: Willow Ora, MD   Chief Complaint: Fall with left hip pain and right shoulder pain.  HPI: [name] [name] is a 57 y.o. female history of COPD, anxiety and ongoing tobacco abuse had a fall in the [place]. Patient was brought to the ER and was found to have left avulsion fracture of the greater trochanter and right shoulder dislocation. The right shoulder was reduced by the ER physician. Since due to pain patient had CT done to make sure there was no hip fracture. Orthopedic on call Dr. Victorino Dike history see the patient in consult and patient has been admitted for pain management. Patient did not hit her head when she fell or lost consciousness. Denies any chest pain or short of breath.   Review of Systems: As presented in the history of presenting illness, rest negative.  Past Medical History  Diagnosis Date  . COPD (chronic obstructive pulmonary disease)     per CT 11/10/05, will minimal bronchiectasis rll  . GERD (gastroesophageal reflux disease)   . Osteoporosis     dexa per gyn  . 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 Headache(784.0)   . Lichen planus   . B12 deficiency   . Endometriosis   . Cataracts, bilateral   . Arthritis   . Lung mass   . Asthma     .  years ago- has not had problem for years.  . Pneumonia     last ime 2008  . Hemorrhoid   . Pneumothorax, spontaneous, tension     x 2 in her 20's.  has a chest tube for one  . 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 Knee surgery      .not replacement .  Marland Kitchen Ovarian cyst removed    . Video bronchoscopy  02/15/2012    Procedure: VIDEO BRONCHOSCOPY WITH FLUORO;  Surgeon: Nyoka Cowden, MD;  Location: [facility];  Service: Endoscopy;  Laterality: Bilateral;  WASHING- INTERVENTION  BIOPSIES INTERVENTION BRONCHOSCOPY WITH VIDEO  . Mediastinoscopy  05/25/2012    Procedure: MEDIASTINOSCOPY;  Surgeon: Alleen Borne, MD;  Location: [facility] OR;  Service: Thoracic;  Laterality: N/A;  . Tonsillectomy    . Flexible bronchoscopy  11/05/2012    Procedure: FLEXIBLE BRONCHOSCOPY;  Surgeon: Alleen Borne, MD;  Location: [facility] OR;  Service: Thoracic;  Laterality: N/A;  . Thoracotomy  11/05/2012    Procedure: THORACOTOMY MAJOR;  Surgeon: Alleen Borne, MD;  Location: [facility] OR;  Service: Thoracic;  Laterality: Right;  . Lobectomy  11/05/2012    Procedure: LOBECTOMY;  Surgeon: Alleen Borne, MD;  Location: [facility] OR;  Service: Thoracic;  Laterality: Right;  right upper lobectomy  . Esophagogastroduodenoscopy  12/03/2012    Procedure: ESOPHAGOGASTRODUODENOSCOPY (EGD);  Surgeon: Hart Carwin, MD;  Location: [facility] ENDOSCOPY;  Service: Endoscopy;  Laterality: N/A;   Social History:  reports that she has been smoking Cigarettes.  She has a 19 pack-year smoking history. She has never used smokeless tobacco. She reports that she drinks alcohol. She reports that she does not use illicit drugs. Where does patient live home. Can patient participate in ADLs? Yes.  Allergies  Allergen Reactions  . Alendronate Sodium Other (See Comments)    REACTION: chest pain  . Antihistamines, Chlorpheniramine-Type Other (See Comments)  REACTION: "makes lungs bleed"  . Benadryl [Diphenhydramine] Other (See Comments)    REACTION: "makes lungs bleed"  . Nsaids Other (See Comments)    REACTION: ischemic colitis  . Other Other (See Comments)    All anti-inflammatories.  REACTION: ischemic colitis  . Pseudoephedrine Other (See Comments)    REACTION: Ischemic colitis  . Robitussin A-C [Guaifenesin-Codeine] Itching and Other (See Comments)    Palms itching  . Levofloxacin Anxiety    Family History:  Family History  Problem Relation Age of Onset  . Colon cancer Neg Hx   . Breast cancer Neg Hx   . Diabetes Mother     GM   . Heart attack      ?  Marland Kitchen Heart disease Mother   . Heart disease Maternal Grandfather   . Cancer Father     sarcoma      Prior to Admission medications   Medication Sig Start Date End Date Taking? Authorizing Provider  acetaminophen (TYLENOL) 500 MG tablet Take 500 mg by mouth every 4 (four) hours as needed for moderate pain or fever. For fever   Yes Historical Provider, MD  ALPRAZolam Prudy Feeler) 0.5 MG tablet Take 0.5-1 tablets (0.25-0.5 mg total) by mouth at bedtime as needed. For insomnia. 09/11/13  Yes Wanda Plump, MD  aspirin 81 MG chewable tablet Chew 81 mg by mouth daily.   Yes Historical Provider, MD  calcium carbonate (OS-CAL) 600 MG TABS tablet Take 600 mg by mouth daily with breakfast.   Yes Historical Provider, MD  HYDROcodone-acetaminophen (NORCO) 10-325 MG per tablet TAKE 1 TABLET BY MOUTH EVERY 6 HOURS AS NEEDED FOR PAIN 06/24/13  Yes Wanda Plump, MD  Magnesium 65 MG TABS Take 65 mg by mouth daily.   Yes Historical Provider, MD  nicotine polacrilex (NICORETTE) 4 MG lozenge Place 4 mg inside cheek every 3 (three) hours as needed. For nicotine cravings.   Yes Historical Provider, MD  ondansetron (ZOFRAN) 4 MG tablet Take 1 tablet (4 mg total) by mouth every 8 (eight) hours as needed for nausea. 03/05/13  Yes Gardiner Barefoot, MD  pantoprazole (PROTONIX) 40 MG tablet Take 40 mg by mouth daily.   Yes Historical Provider, MD  Zinc 50 MG TABS Take 50 mg by mouth daily.   Yes Historical Provider, MD    Physical Exam: Filed Vitals:   10/17/13 0030 10/17/13 0100 10/17/13 0122 10/17/13 0228  BP: 113/75 117/67 117/67 123/63  Pulse: 72 66 68 81  Temp:    98.1 F (36.7 C)  TempSrc:    Oral  Resp: 19 14 14 20   Height:    5\' 6"  (1.676 m)  Weight:    48.263 kg (106 lb 6.4 oz)  SpO2: 94% 94% 95% 99%     General:  Well-developed moderately nourished.  Eyes: Anicteric no pallor.  ENT: No discharge from ears eyes nose mouth.  Neck: No mass felt.  Cardiovascular: S1-S2  heard.  Respiratory: No rhonchi or crepitations.  Abdomen: Soft nontender bowel sounds present.  Skin: No rash. There is a bruise on the left elbow.  Musculoskeletal: No edema. Pain on moving the left hip.  Psychiatric: Appears normal.  Neurologic: Alert awake oriented to time place and person. Moves all extremities.  Labs on Admission:  Basic Metabolic Panel:  Recent Labs Lab 10/17/13 0030  NA 141  K 3.7  CL 105  CO2 26  GLUCOSE 93  BUN 16  CREATININE 0.77  CALCIUM 8.5   Liver Function  Tests:  Recent Labs Lab 10/17/13 0030  AST 15  ALT 12  ALKPHOS 44  BILITOT 0.1*  PROT 6.4  ALBUMIN 3.6   No results found for this basename: LIPASE, AMYLASE,  in the last 168 hours No results found for this basename: AMMONIA,  in the last 168 hours CBC:  Recent Labs Lab 10/17/13 0030  WBC 9.8  HGB 12.2  HCT 35.2*  MCV 101.1*  PLT 321   Cardiac Enzymes: No results found for this basename: CKTOTAL, CKMB, CKMBINDEX, TROPONINI,  in the last 168 hours  BNP (last 3 results) No results found for this basename: PROBNP,  in the last 8760 hours CBG: No results found for this basename: GLUCAP,  in the last 168 hours  Radiological Exams on Admission: Dg Shoulder Right  10/16/2013   CLINICAL DATA:  Right shoulder  EXAM: RIGHT SHOULDER - 2+ VIEW  COMPARISON:  Similar film from earlier in the same day.  FINDINGS: Humeral head appears well located. On the Y view it does appear somewhat posteriorly oriented although no true dislocation is seen. Degenerative changes of the acromioclavicular joint are noted.   Electronically Signed   By: Alcide Clever M.D.   On: 10/16/2013 22:07   Dg Shoulder Right  10/16/2013   CLINICAL DATA:  Larey Seat.  Shoulder pain.  EXAM: RIGHT SHOULDER - 2+ VIEW  COMPARISON:  None.  FINDINGS: Findings suspicious for a posterior dislocation. No acute fractures identified. The right lung is clear. Surgical changes are noted. The [facility] joint is intact.  IMPRESSION: Suspect  posterior dislocation.  No definite fracture.   Electronically Signed   By: Loralie Champagne M.D.   On: 10/16/2013 19:23   Dg Hip Complete Left  10/16/2013   CLINICAL DATA:  Left hip pain  EXAM: LEFT HIP - COMPLETE 2+ VIEW  COMPARISON:  None.  FINDINGS: There is an avulsion fracture of the greater trochanter identified with only minimal displacement medially. No other fracture is seen. The pelvic ring is intact. No gross soft tissue abnormality is noted.  IMPRESSION: Avulsion fracture of the greater trochanter on the left proximal femur.   Electronically Signed   By: Alcide Clever M.D.   On: 10/16/2013 19:13   Ct Hip Left Wo Contrast  10/16/2013   CLINICAL DATA:  Status post fall; known avulsion fracture at the left hip.  EXAM: CT OF THE LEFT HIP WITHOUT CONTRAST  TECHNIQUE: Multidetector CT imaging was performed according to the standard protocol. Multiplanar CT image reconstructions were also generated.  COMPARISON:  Left hip radiographs performed earlier today at 06:38 p.m.  FINDINGS: There is a mildly comminuted mildly displaced avulsion fracture involving left greater femoral trochanter. No definite femoral neck fracture is identified, though if there is persistent significant clinical concern for occult femoral neck fracture, MRI could be considered for further evaluation. The left femoral head remains seated at the acetabulum. The left superior and inferior pubic rami are intact. The pubic symphysis is unremarkable in appearance. The left sacroiliac joint is incompletely imaged but appears grossly unremarkable.  The visualized small and large bowel are grossly unremarkable in appearance. The bladder is mildly distended and grossly unremarkable. Mild soft tissue injury is noted overlying the left greater femoral trochanter, and about the site of avulsion injury. No significant soft tissue hematoma is seen.  IMPRESSION: 1. Mildly comminuted mildly displaced avulsion fracture involving the left greater  femoral trochanter, as noted on radiograph. 2. No definite femoral neck fracture seen, though if there is  significant persistent clinical concern for occult femoral neck fracture, MRI could be considered for further evaluation. 3. Mild soft tissue injury noted overlying the left greater femoral trochanter, and about the site of avulsion injury.   Electronically Signed   By: Roanna Raider M.D.   On: 10/16/2013 22:38   Dg Knee Complete 4 Views Left  10/16/2013   CLINICAL DATA:  Left knee pain  EXAM: LEFT KNEE - COMPLETE 4+ VIEW  COMPARISON:  None.  FINDINGS: No acute fracture or dislocation is noted. Mild calcification of the menisci is seen. No joint effusion is noted.  IMPRESSION: No acute abnormality is noted.   Electronically Signed   By: Alcide Clever M.D.   On: 10/16/2013 19:12     Assessment/Plan Principal Problem:   Avulsion fracture of trochanter of femur Active Problems:   CIGARETTE SMOKER   Shoulder dislocation   Anxiety   1. Avulsion fracture of trochanter of femur and right shoulder dislocation - patient has been placed on pain relief medications. Consult physical therapy. Further recommendations per Dr. Victorino Dike, orthopedic surgeon. 2. History of COPD - presently not wheezing. 3. Anxiety - continue present medications. 4. History of chronic pain.    Code Status: Full code.  Family Communication: None.  Disposition Plan: Admit inpatient.    Duquan Gillooly N. [facility] Pager [phone].  If 7PM-7AM, please contact night-coverage [email or web address] Password [email or web address] 10/17/2013, 4:22 AM

## 2013-10-17 NOTE — Progress Notes (Signed)
INITIAL NUTRITION ASSESSMENT  DOCUMENTATION CODES Per approved criteria  -Non-severe (moderate) malnutrition in the context of chronic illness   INTERVENTION: 1.  Supplements; Resource Breeze po TID, each supplement provides 250 kcal and 9 grams of protein.  NUTRITION DIAGNOSIS: Inadequate oral intake related to poor appetite as evidenced by pt report.   Monitor:  1.  Food/Beverage; pt meeting >/=90% estimated needs with tolerance. 2.  Wt/wt change; monitor trends  Reason for Assessment: Low BMI  57 y.o. female  Admitting Dx: Avulsion fracture of trochanter of femur  ASSESSMENT: Pt admitted with avulsion fracture of greater trochanter.   RD met with pt who reports progressive wt loss since 2012.  Pt states that in Dec 2012 she was diagnosed with a lung infection and was hospitalized for treatment.  She states that she weighed 120 lbs in Feb 2013 after this infection, but has since been losing wt.  Pt states that she has been discussed her wt with her PCP who has also noted the progressive loss.  She does obtain Breeze Wildberry supplements through [facility].  Per dietary recall, pt eats 1 package of peanut butter crackers, and a large lunch meal daily.  She has never been a large breakfast eater, and does not eat dinner due to GERD with reflux during the night if she eats.   Discussed nutrition needs and ways to achieve.  Reviewed recall with patient.  Encouraged consistency with Breeze supplements daily.  Nutrition Focused Physical Exam: Subcutaneous Fat:  Orbital Region: mild wasting Upper Arm Region: moderate wasting Thoracic and Lumbar Region: moderate wasting  Muscle:  Temple Region: mild wasting Clavicle Bone Region: moderate-severe wasting Clavicle and Acromion Bone Region: moderate wasting Scapular Bone Region: not assessed Dorsal Hand: moderate wasting Patellar Region: not assessed Anterior Thigh Region: not assessed Posterior Calf Region: not assessed  Edema: none  present  Pt meets criteria for moderate MALNUTRITION in the context of chronic as evidenced by 11% wt loss in 1 year, PO intake insufficient to meet >75% estimated needs consistently, and nutrition-focused physical exam.  Height: Ht Readings from Last 1 Encounters:  10/17/13 5\' 6"  (1.676 m)    Weight: Wt Readings from Last 1 Encounters:  10/17/13 106 lb 6.4 oz (48.263 kg)    Ideal Body Weight: 59.1 kg  % Ideal Body Weight: 81%  Wt Readings from Last 10 Encounters:  10/17/13 106 lb 6.4 oz (48.263 kg)  08/27/13 106 lb (48.081 kg)  06/04/13 111 lb (50.349 kg)  03/05/13 116 lb (52.617 kg)  01/29/13 117 lb (53.071 kg)  01/01/13 117 lb (53.071 kg)  12/20/12 112 lb (50.803 kg)  12/18/12 109 lb (49.442 kg)  12/14/12 109 lb (49.442 kg)  11/26/12 111 lb 15.9 oz (50.8 kg)    Usual Body Weight: 120 lbs  % Usual Body Weight: 88%  BMI:  Body mass index is 17.18 kg/(m^2).  Estimated Nutritional Needs: Kcal: 1540-1690 Protein: 57-68g Fluid: >1.5 L/day  Skin: intact  Diet Order: General  EDUCATION NEEDS: -Education needs addressed   Intake/Output Summary (Last 24 hours) at 10/17/13 1544 Last data filed at 10/17/13 1400  Gross per 24 hour  Intake 1287.17 ml  Output   1300 ml  Net -12.83 ml    Last BM: intact  Labs:   Recent Labs Lab 10/17/13 0030  NA 141  K 3.7  CL 105  CO2 26  BUN 16  CREATININE 0.77  CALCIUM 8.5  GLUCOSE 93    CBG (last 3)  No results found  for this basename: GLUCAP,  in the last 72 hours  Scheduled Meds: . aspirin  81 mg Oral Daily  . calcium carbonate  1 tablet Oral Q breakfast  . magnesium chloride  1 tablet Oral Daily  . pantoprazole  40 mg Oral Daily  . zinc sulfate  220 mg Oral Daily    Continuous Infusions: . sodium chloride 10 mL/hr (10/17/13 0517)    Past Medical History  Diagnosis Date  . COPD (chronic obstructive pulmonary disease)     per CT 11/10/05, will minimal bronchiectasis rll  . GERD (gastroesophageal  reflux disease)   . Osteoporosis     dexa per gyn  . 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 Headache(784.0)   . Lichen planus   . B12 deficiency   . Endometriosis   . Cataracts, bilateral   . Arthritis   . Lung mass   . Asthma     .  years ago- has not had problem for years.  . Pneumonia     last ime 2008  . Hemorrhoid   . Pneumothorax, spontaneous, tension     x 2 in her 20's.  has a chest tube for one  . 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 Knee surgery      .not replacement .  Marland Kitchen Ovarian cyst removed    . Video bronchoscopy  02/15/2012    Procedure: VIDEO BRONCHOSCOPY WITH FLUORO;  Surgeon: Nyoka Cowden, MD;  Location: [facility];  Service: Endoscopy;  Laterality: Bilateral;  WASHING- INTERVENTION BIOPSIES INTERVENTION BRONCHOSCOPY WITH VIDEO  . Mediastinoscopy  05/25/2012    Procedure: MEDIASTINOSCOPY;  Surgeon: Alleen Borne, MD;  Location: [facility] OR;  Service: Thoracic;  Laterality: N/A;  . Tonsillectomy    . Flexible bronchoscopy  11/05/2012    Procedure: FLEXIBLE BRONCHOSCOPY;  Surgeon: Alleen Borne, MD;  Location: [facility] OR;  Service: Thoracic;  Laterality: N/A;  . Thoracotomy  11/05/2012    Procedure: THORACOTOMY MAJOR;  Surgeon: Alleen Borne, MD;  Location: [facility] OR;  Service: Thoracic;  Laterality: Right;  . Lobectomy  11/05/2012    Procedure: LOBECTOMY;  Surgeon: Alleen Borne, MD;  Location: [facility] OR;  Service: Thoracic;  Laterality: Right;  right upper lobectomy  . Esophagogastroduodenoscopy  12/03/2012    Procedure: ESOPHAGOGASTRODUODENOSCOPY (EGD);  Surgeon: Hart Carwin, MD;  Location: [facility] ENDOSCOPY;  Service: Endoscopy;  Laterality: N/A;    Loyce Dys, MS RD LDN Clinical Inpatient Dietitian Pager: [phone] Weekend/After hours pager: [phone]

## 2013-10-17 NOTE — Consult Note (Signed)
Pt seen and examined.  Agree with Mr. Collier Salina note above.  1.  Left hip greater troch avulsion fracture.  Pt can be WBAT on the L LE.  She will need pain control and PT for ambulation.  2.  R shoulder pain - WBAT on the R UE.  PT for active ROM and strengthening to tolerance.  3.  L upper extremity contusion - wbAT on the L UE.  Full ROM without restrictions.  I expect this to resolve spontaneously.  We'll continue to follow with you.

## 2013-10-17 NOTE — ED Provider Notes (Signed)
Medical screening examination/treatment/procedure(s) were conducted as a shared visit with non-physician practitioner(s) and myself.  I personally evaluated the patient during the encounter.  EKG Interpretation     Ventricular Rate:    PR Interval:    QRS Duration:   QT Interval:    QTC Calculation:   R Axis:     Text Interpretation:              I interviewed and examined the patient. Lungs are CTAB. Cardiac exam wnl. Abdomen soft.  I examined pt's shoulders, doubt posterior dislocation. Will repeat imaging. Pt also unable to bear weight, will get CT of hip. Plan for admission and ortho eval w/ possible MRI as pt still unable to bear weight.   Junius Argyle, MD 10/17/13 [phone]

## 2013-10-17 NOTE — Evaluation (Signed)
Physical Therapy Evaluation Patient Details Name: [name] [name] MRN: [id number] DOB: 1956/06/24 Today's Date: 10/17/2013 Time: 4098-1191 PT Time Calculation (min): 26 min  PT Assessment / Plan / Recommendation History of Present Illness  HPI: Pt is a 57 y.o. female brought in to the Emergency Department for fall last night when she tripped over a piece of concrete while walking in the [employer] parking garage. Pt had initial pain in right shoulder and left hip.  Right shoulder xrays were reviewed and found no dislocation or acute bony abnormality present.  Left hip radiographs revealed proximal avulsion fracture of greater trochanter with no femoral neck or intertrochanteric fractures noted.  CT was ordered and confirmed avulsion fracture of GT only.  This AM pt states her left hip pain feels like a muscle spasm, that is brought on with movement.  Clinical Impression  Patient significantly limited with mobility as indicated below. Patient with increased pain and spasms, unable to mobilize without increased assist, feel patient is high fall risk at this time. Patient demonstrates deficits as indicated below. Will benefit from acute PT to address deficits and maximize function. Patient does not want to consider rehab stay despite inability to mobilize. Patient states she has sister that may assist her upon discharge. This PT is concerned that patient is not safe for d/c home given currently functional deficits and pain levels with both RUE and LLE. If pain becomes manageable and patient is able to progress with mobility then would recommend dc home with assist and HHPT.    PT Assessment  Patient needs continued PT services    Follow Up Recommendations  Home health PT;Supervision/Assistance - 24 hour (Pt insistant on going home, HIGH fall risk 2/2 pain)       Barriers to Discharge Inaccessible home environment step to enter, unable to get wheel chair through house    Equipment  Recommendations  Rolling walker with 5" wheels    Recommendations for Other Services     Frequency Min 4X/week    Precautions / Restrictions Precautions Precautions: Fall Restrictions Weight Bearing Restrictions: Yes RUE Weight Bearing: Weight bearing as tolerated   Pertinent Vitals/Pain 9/10      Mobility  Bed Mobility Bed Mobility: Supine to Sit;Sitting - Scoot to Edge of Bed Supine to Sit: 3: Mod assist Sitting - Scoot to Edge of Bed: 3: Mod assist Details for Bed Mobility Assistance: Significant pain with increased spasms during mobility Transfers Transfers: Sit to Stand;Stand to Sit;Stand Pivot Transfers Sit to Stand: 3: Mod assist Stand to Sit: 3: Mod assist Stand Pivot Transfers: 3: Mod assist Details for Transfer Assistance: Patient significantly limited by pain both LLE and RUE Ambulation/Gait Ambulation/Gait Assistance: 3: Mod assist Ambulation Distance (Feet): 6 Feet Assistive device: Rolling walker Ambulation/Gait Assistance Details: patient requiring increased assist for very minimal ambulation secondary to pain Gait Pattern: Step-to pattern        PT Diagnosis: Difficulty walking;Abnormality of gait;Acute pain  PT Problem List: Decreased strength;Decreased range of motion;Decreased activity tolerance;Decreased balance;Decreased mobility;Decreased knowledge of precautions;Pain PT Treatment Interventions: DME instruction;Gait training;Stair training;Functional mobility training;Therapeutic activities;Therapeutic exercise;Patient/family education     PT Goals(Current goals can be found in the care plan section) Acute Rehab PT Goals Patient Stated Goal: to go home as soon as possible PT Goal Formulation: With patient Time For Goal Achievement: 10/31/13 Potential to Achieve Goals: Fair  Visit Information  Last PT Received On: 10/17/13 Assistance Needed: +1 History of Present Illness: HPI: Pt is a 57 y.o. female  brought in to the Emergency Department for  fall last night when she tripped over a piece of concrete while walking in the [employer] parking garage. Pt had initial pain in right shoulder and left hip.  Right shoulder xrays were reviewed and found no dislocation or acute bony abnormality present.  Left hip radiographs revealed proximal avulsion fracture of greater trochanter with no femoral neck or intertrochanteric fractures noted.  CT was ordered and confirmed avulsion fracture of GT only.  This AM pt states her left hip pain feels like a muscle spasm, that is brought on with movement.       Prior Functioning  Home Living Family/patient expects to be discharged to:: Private residence Living Arrangements: Parent;Other relatives Available Help at Discharge: Family;Available 24 hours/day Type of Home: House Home Access: Stairs to enter [employer] of Steps: 1 Entrance Stairs-Rails: None Home Layout: One level Home Equipment: Walker - 2 wheels;Bedside commode (transport chair) Prior Function Level of Independence: Independent Comments: Works at [employer] job;  All plof info is baseline - prior to surgery early December Communication Communication: No difficulties Dominant Hand: Right    Cognition  Cognition Arousal/Alertness: Awake/alert Behavior During Therapy: WFL for tasks assessed/performed Overall Cognitive Status: Within Functional Limits for tasks assessed    Extremity/Trunk Assessment Upper Extremity Assessment Upper Extremity Assessment: Defer to OT evaluation Lower Extremity Assessment Lower Extremity Assessment: LLE deficits/detail LLE: Unable to fully assess due to pain   Balance    End of Session PT - End of Session Equipment Utilized During Treatment: Gait belt Activity Tolerance: Patient limited by pain Patient left: in chair;with call bell/phone within reach Nurse Communication: Mobility status  GP     Fabio Asa 10/17/2013, 2:27 PM Charlotte Crumb, PT DPT  [phone]

## 2013-10-17 NOTE — ED Notes (Signed)
Given to [facility] . Pt. denies pain at this time / respirations unlabored. IV site unremarkable .

## 2013-10-18 MED ORDER — OXYCODONE-ACETAMINOPHEN 5-325 MG PO TABS
1.0000 | ORAL_TABLET | ORAL | Status: DC | PRN
  Administered 2013-10-18 – 2013-10-19 (×6): 2 via ORAL
  Filled 2013-10-18 (×6): qty 2

## 2013-10-18 NOTE — Progress Notes (Signed)
Physical Therapy Treatment Patient Details Name: [name] [name] MRN: [id number] DOB: 1956-02-28 Today's Date: 10/18/2013 Time: 4098-1191 PT Time Calculation (min): 34 min  PT Assessment / Plan / Recommendation  History of Present Illness HPI: Pt is a 57 y.o. female brought in to the Emergency Department for fall last night when she tripped over a piece of concrete while walking in the [employer] parking garage. Pt had initial pain in right shoulder and left hip.  Right shoulder xrays were reviewed and found no dislocation or acute bony abnormality present.  Left hip radiographs revealed proximal avulsion fracture of greater trochanter with no femoral neck or intertrochanteric fractures noted.  CT was ordered and confirmed avulsion fracture of GT only.  This AM pt states her left hip pain feels like a muscle spasm, that is brought on with movement.   PT Comments   Patient able to ambulate further today with continued spasms, but less assist.  Still has greatest difficulty with transitions and needs 24 hour help to make it at home.  Feel HHPT and OT would assist her with rehab at home.  Has needed equipment even transport chair if needed for home entry (one step to enter.)  Encouraged AROM/AAROM right shoulder to avoid loss of motion.  Follow Up Recommendations  Home health PT;Supervision/Assistance - 24 hour     Does the patient have the potential to tolerate intense rehabilitation   N/A  Barriers to Discharge  None      Equipment Recommendations   (does have her mom's walker to use at home)    Recommendations for Other Services  None  Frequency Min 4X/week   Progress towards PT Goals Progress towards PT goals: Progressing toward goals  Plan Current plan remains appropriate    Precautions / Restrictions Precautions Precautions: Fall Restrictions Weight Bearing Restrictions: Yes RUE Weight Bearing: Weight bearing as tolerated LLE Weight Bearing: Weight bearing as tolerated    Pertinent Vitals/Pain With spasms severe pain evident left groin    Mobility  Bed Mobility Bed Mobility: Sit to Supine Supine to Sit: 3: Mod assist;HOB elevated Sitting - Scoot to Edge of Bed: 5: Supervision Sit to Supine: 3: Mod assist;HOB flat Details for Bed Mobility Assistance: assist for keeping legs together and for lifting/lowering from bed due to spasms and pain Transfers Sit to Stand: 3: Mod assist;From bed;From chair/3-in-1;With upper extremity assist Stand to Sit: 3: Mod assist;To chair/3-in-1;To bed;With upper extremity assist Ambulation/Gait Ambulation/Gait Assistance: 4: Min assist Ambulation Distance (Feet): 12 Feet Assistive device: Rolling walker Ambulation/Gait Assistance Details: able to progress left leg and demonstrates ability to weight bear minimally on that leg; no c/o shoulder pain with ambulation Gait Pattern: Step-to pattern;Antalgic;Decreased stride length;Decreased stance time - left;Trunk flexed    Exercises General Exercises - Upper Extremity Shoulder Flexion: AROM;Both;5 reps;Supine General Exercises - Lower Extremity Heel Slides: AAROM;Left;Other reps (comment) (attemtpted for pre mobility warm up, but limited due to spasm)   PT Goals (current goals can now be found in the care plan section)    Visit Information  Last PT Received On: 10/18/13 Assistance Needed: +1 History of Present Illness: HPI: Pt is a 57 y.o. female brought in to the Emergency Department for fall last night when she tripped over a piece of concrete while walking in the [employer] parking garage. Pt had initial pain in right shoulder and left hip.  Right shoulder xrays were reviewed and found no dislocation or acute bony abnormality present.  Left hip radiographs revealed proximal  avulsion fracture of greater trochanter with no femoral neck or intertrochanteric fractures noted.  CT was ordered and confirmed avulsion fracture of GT only.  This AM pt states her left hip pain feels  like a muscle spasm, that is brought on with movement.    Subjective Data   I think I did better.   Cognition  Cognition Arousal/Alertness: Awake/alert Behavior During Therapy: WFL for tasks assessed/performed Overall Cognitive Status: Within Functional Limits for tasks assessed    Balance  Balance Balance Assessed: Yes Static Standing Balance Static Standing - Balance Support: During functional activity;Left upper extremity supported Static Standing - Level of Assistance: 4: Min assist Static Standing - Comment/# of Minutes: standing to brush teeth at sink about 3 minutes  End of Session PT - End of Session Equipment Utilized During Treatment: Gait belt Activity Tolerance: Patient limited by pain Patient left: in bed;with call bell/phone within reach   GP     [facility] 10/18/2013, 3:09 PM [place], [place] [phone] 10/18/2013

## 2013-10-18 NOTE — Progress Notes (Signed)
Subjective: 57y/o female inpt for fall causing left proximal GT avulsion fracture.  Doing better this afternoon.  Pain during ambulation varied but was worse with transitioning from laying to sitting.  Pain in left hip persists as spasms with movement but are less intense today vs yesterday.  Pt was able to stand with PT, transition to chair and toilet with assistive device.  Pt denies N/V/F/C, SOB, chest pain, calf pain, or paresthesia bilaterally.   Objective: Vital signs in last 24 hours: Temp:  [98.7 F (37.1 C)-98.9 F (37.2 C)] 98.8 F (37.1 C) (11/14 0537) Pulse Rate:  [80-88] 80 (11/14 0537) Resp:  [20] 20 (11/14 0537) BP: (90-114)/(55-62) 90/55 mmHg (11/14 0537) SpO2:  [93 %-95 %] 95 % (11/14 0537)  Intake/Output from previous day: 11/13 0701 - 11/14 0700 In: 927.2 [P.O.:720; I.V.:207.2] Out: 1200 [Urine:1200] Intake/Output this shift: Total I/O In: 240 [P.O.:240] Out: -    Recent Labs  10/17/13 0030  HGB 12.2    Recent Labs  10/17/13 0030  WBC 9.8  RBC 3.48*  HCT 35.2*  PLT 321    Recent Labs  10/17/13 0030  NA 141  K 3.7  CL 105  CO2 26  BUN 16  CREATININE 0.77  GLUCOSE 93  CALCIUM 8.5   No results found for this basename: LABPT, INR,  in the last 72 hours  WD WN 57y/o female NAD, A/Ox3, pt appears stated age.  EOMI, mood and affect are normal, respirations unlabored.  On physical exam pt presented in bed lying supine.  She was able to abduct and adduct left hip 45 degrees in bed.  Pt was able to stand with my assistance and with walker.  Pt took a few steps then returned to bed.  +TTP to left GT with mild radiation of pain to groin.  DP pulses are 2+ bilaterally.  Sensation to light touch about the LLE are normal.  Toes are well perfused with cap refill <2sec  Assessment/Plan: I recommend home PT for pt due to issues with immobility and lack of support. Continue pain management Continue ambulation with assistive device and physical therapy  treatment Follow up with Dr. Victorino Dike in 2 weeks   FLOWERS, CHRISTOPHER S 10/18/2013, 1:12 PM

## 2013-10-18 NOTE — Progress Notes (Signed)
[facility] PROGRESS NOTE  [name] [name] ZOX:[id number] DOB: Nov 19, 1956 DOA: 10/16/2013 PCP: Willow Ora, MD  Assessment/Plan: Avulsion fracture of trochanter of femur  -per ortho, non surgical -WBAT, PT following -pain management Active Problems:  Shoulder dislocation -per ortho, PT following -pain management  Anxiety -Continue outpt meds S/p fall -PT/OT recommending HH   Code Status:full Family Communication: none at bedside  Disposition Plan: to home when medically stable   Consultants:  ortho  Procedures:  none  Antibiotics:  none  HPI/Subjective: Pain better today, but still with severe spasms; and requiring IV pain meds.  Objective: Filed Vitals:   10/18/13 1352  BP: 108/64  Pulse: 82  Temp: 98.6 F (37 C)  Resp: 20    Intake/Output Summary (Last 24 hours) at 10/18/13 1850 Last data filed at 10/18/13 1817  Gross per 24 hour  Intake    840 ml  Output      0 ml  Net    840 ml   Filed Weights   10/17/13 0228  Weight: 48.263 kg (106 lb 6.4 oz)   Exam:  General: alert & oriented x 3 In NAD Cardiovascular: RRR, nl S1 s2 Respiratory: CTAB Abdomen: soft +BS NT/ND, no masses palpable Extremities: No cyanosis and no edema    Data Reviewed: Basic Metabolic Panel:  Recent Labs Lab 10/17/13 0030  NA 141  K 3.7  CL 105  CO2 26  GLUCOSE 93  BUN 16  CREATININE 0.77  CALCIUM 8.5   Liver Function Tests:  Recent Labs Lab 10/17/13 0030  AST 15  ALT 12  ALKPHOS 44  BILITOT 0.1*  PROT 6.4  ALBUMIN 3.6   No results found for this basename: LIPASE, AMYLASE,  in the last 168 hours No results found for this basename: AMMONIA,  in the last 168 hours CBC:  Recent Labs Lab 10/17/13 0030  WBC 9.8  HGB 12.2  HCT 35.2*  MCV 101.1*  PLT 321   Cardiac Enzymes: No results found for this basename: CKTOTAL, CKMB, CKMBINDEX, TROPONINI,  in the last 168 hours BNP (last 3 results) No results found for this basename: PROBNP,  in the  last 8760 hours CBG: No results found for this basename: GLUCAP,  in the last 168 hours  No results found for this or any previous visit (from the past 240 hour(s)).   Studies: Dg Shoulder Right  10/16/2013   CLINICAL DATA:  Right shoulder  EXAM: RIGHT SHOULDER - 2+ VIEW  COMPARISON:  Similar film from earlier in the same day.  FINDINGS: Humeral head appears well located. On the Y view it does appear somewhat posteriorly oriented although no true dislocation is seen. Degenerative changes of the acromioclavicular joint are noted.   Electronically Signed   By: Alcide Clever M.D.   On: 10/16/2013 22:07   Dg Shoulder Right  10/16/2013   CLINICAL DATA:  Larey Seat.  Shoulder pain.  EXAM: RIGHT SHOULDER - 2+ VIEW  COMPARISON:  None.  FINDINGS: Findings suspicious for a posterior dislocation. No acute fractures identified. The right lung is clear. Surgical changes are noted. [facility] joint is intact.  IMPRESSION: Suspect posterior dislocation.  No definite fracture.   Electronically Signed   By: Loralie Champagne M.D.   On: 10/16/2013 19:23   Dg Hip Complete Left  10/16/2013   CLINICAL DATA:  Left hip pain  EXAM: LEFT HIP - COMPLETE 2+ VIEW  COMPARISON:  None.  FINDINGS: There is an avulsion fracture of the greater trochanter identified  with only minimal displacement medially. No other fracture is seen. The pelvic ring is intact. No gross soft tissue abnormality is noted.  IMPRESSION: Avulsion fracture of the greater trochanter on the left proximal femur.   Electronically Signed   By: Alcide Clever M.D.   On: 10/16/2013 19:13   Ct Hip Left Wo Contrast  10/16/2013   CLINICAL DATA:  Status post fall; known avulsion fracture at the left hip.  EXAM: CT OF THE LEFT HIP WITHOUT CONTRAST  TECHNIQUE: Multidetector CT imaging was performed according to the standard protocol. Multiplanar CT image reconstructions were also generated.  COMPARISON:  Left hip radiographs performed earlier today at 06:38 p.m.  FINDINGS: There is  a mildly comminuted mildly displaced avulsion fracture involving left greater femoral trochanter. No definite femoral neck fracture is identified, though if there is persistent significant clinical concern for occult femoral neck fracture, MRI could be considered for further evaluation. The left femoral head remains seated at the acetabulum. The left superior and inferior pubic rami are intact. The pubic symphysis is unremarkable in appearance. The left sacroiliac joint is incompletely imaged but appears grossly unremarkable.  The visualized small and large bowel are grossly unremarkable in appearance. The bladder is mildly distended and grossly unremarkable. Mild soft tissue injury is noted overlying the left greater femoral trochanter, and about the site of avulsion injury. No significant soft tissue hematoma is seen.  IMPRESSION: 1. Mildly comminuted mildly displaced avulsion fracture involving the left greater femoral trochanter, as noted on radiograph. 2. No definite femoral neck fracture seen, though if there is significant persistent clinical concern for occult femoral neck fracture, MRI could be considered for further evaluation. 3. Mild soft tissue injury noted overlying the left greater femoral trochanter, and about the site of avulsion injury.   Electronically Signed   By: Roanna Raider M.D.   On: 10/16/2013 22:38   Dg Knee Complete 4 Views Left  10/16/2013   CLINICAL DATA:  Left knee pain  EXAM: LEFT KNEE - COMPLETE 4+ VIEW  COMPARISON:  None.  FINDINGS: No acute fracture or dislocation is noted. Mild calcification of the menisci is seen. No joint effusion is noted.  IMPRESSION: No acute abnormality is noted.   Electronically Signed   By: Alcide Clever M.D.   On: 10/16/2013 19:12    Scheduled Meds: . aspirin  81 mg Oral Daily  . calcium carbonate  1 tablet Oral Q breakfast  . feeding supplement (RESOURCE BREEZE)  1 Container Oral TID BM  . magnesium chloride  1 tablet Oral Daily  .  pantoprazole  40 mg Oral Daily  . zinc sulfate  220 mg Oral Daily   Continuous Infusions: . sodium chloride 10 mL/hr (10/17/13 0517)    Principal Problem:   Avulsion fracture of trochanter of femur Active Problems:   CIGARET[employer] SMO[employer]   Shoulder dislocation   Anxiety    Time spent: 25    [facility]  [facility] Pager [phone]. If 7PM-7AM, please contact night-coverage at [email or web address], password [facility] 10/18/2013, 6:50 PM  LOS: 2 days

## 2013-10-19 MED ORDER — BOOST / RESOURCE BREEZE PO LIQD: 1.0000 | Freq: Three times a day (TID) | ORAL | Status: DC

## 2013-10-19 MED ORDER — OXYCODONE-ACETAMINOPHEN 5-325 MG PO TABS: 1.0000 | ORAL_TABLET | ORAL | Status: DC | PRN

## 2013-10-19 MED ORDER — CALCIUM CARBONATE 600 MG PO TABS: 600.0000 mg | ORAL_TABLET | Freq: Two times a day (BID) | ORAL | Status: DC

## 2013-10-19 MED ORDER — METHOCARBAMOL 500 MG PO TABS: 500.0000 mg | ORAL_TABLET | Freq: Four times a day (QID) | ORAL | Status: DC | PRN

## 2013-10-19 NOTE — Progress Notes (Signed)
Subjective: Pt doing well this afternoon, progressing well with physical therapy.  Able to ambulate around room and transfer to chair.  Pt reports pain persists in left hip and right shoulder but getting better.  Pt denies N/V/F/C, chest pain, SOB, calf pain, and paresthesia bilaterally.     Objective: Vital signs in last 24 hours: Temp:  [97.8 F (36.6 C)-98.6 F (37 C)] 97.8 F (36.6 C) (11/15 0552) Pulse Rate:  [76-82] 78 (11/15 0552) Resp:  [16-20] 16 (11/15 0552) BP: (96-108)/(49-64) 96/56 mmHg (11/15 0552) SpO2:  [95 %-98 %] 95 % (11/15 0552)  Intake/Output from previous day: 11/14 0701 - 11/15 0700 In: 820 [P.O.:820] Out: -  Intake/Output this shift: Total I/O In: -  Out: 500 [Urine:500]   Recent Labs  10/17/13 0030  HGB 12.2    Recent Labs  10/17/13 0030  WBC 9.8  RBC 3.48*  HCT 35.2*  PLT 321    Recent Labs  10/17/13 0030  NA 141  K 3.7  CL 105  CO2 26  BUN 16  CREATININE 0.77  GLUCOSE 93  CALCIUM 8.5   No results found for this basename: LABPT, INR,  in the last 72 hours  WD WN 57y/o female NAD A/Ox3, appears stated age.  EOMI, mood and affect normal, respirations unlabored.  On physical exam left hip is TTP, pt able to abduct 45 degrees and adduct back with little discomfort.  Normal sensation to light touch intact throughout LLE.  DP pulse 2+ bilaterally.  5/5 strength with ankle dorsi/plantar flexion and equal bilaterally.  Assessment/Plan: Discharge home today WBAT Home health for physical therapy assigned F/u with Dr. Victorino Dike in 2 weeks   FLOWERS, CHRISTOPHER S 10/19/2013, 12:17 PM

## 2013-10-19 NOTE — Progress Notes (Signed)
   CARE MANAGEMENT NOTE 10/19/2013  Patient:  SHENAE, BONANNO   Account Number:  [id number]  Date Initiated:  10/19/2013  Documentation initiated by:  [facility]  Subjective/Objective Assessment:   adm: Avulsion fracture of trochanter of femur     Action/Plan:   discharge planning   Anticipated DC Date:  10/19/2013   Anticipated DC Plan:  HOME W HOME HEALTH SERVICES      DC Planning Services  CM consult      [facility] Choice  HOME HEALTH   Choice offered to / List presented to:          [facility] arranged  HH-2 PT  HH-3 OT      [facility] agency  [facility]   Status of service:  Completed, signed off Medicare Important Message given?   (If response is "NO", the following Medicare IM given date fields will be blank) Date Medicare IM given:   Date Additional Medicare IM given:    Discharge Disposition:  HOME W HOME HEALTH SERVICES  Per UR Regulation:    If discussed at Long Length of Stay Meetings, dates discussed:    Comments:  10/19/13 12:20 CM spoke with pt and pt chooses [facility] for HHPT/OT.  Pt states she has a RW and 3n1 at home.  Address and contact numbers verified.  Referral faxed to [facility] for HHPT/OT.  No other CM needs were communicated.  Freddy Jaksch, Georgiann Mohs [phone].

## 2013-10-19 NOTE — Discharge Summary (Signed)
Physician Discharge Summary  [name] [name] UJW:[id number] DOB: [birth year] DOA: 10/16/2013  PCP: Willow Ora, MD  Admit date: 10/16/2013 Discharge date: 10/19/2013  Time spent: >30 minutes  Recommendations for Outpatient Follow-up:  Follow-up Information   Follow up with [facility]. (home health physical and occupational therapy)    Contact information:   [address] [place] [address] [phone]       Follow up with Willow Ora, MD. (in 1-2weeks, call for appt)    Specialty:  Internal Medicine   Contact information:   [phone] [address] [employer] [address] [place] [address] [phone]       Follow up with Toni Arthurs, MD. (in 2weeks, call for appt)    Specialty:  Orthopedic Surgery   Contact information:   [address] [place] [address] [phone]        Discharge Diagnoses:  Principal Problem:   Avulsion fracture of trochanter of femur Active Problems:   CIGARETTE SMOKER   Shoulder dislocation   Anxiety   Discharge Condition: Improved/stable  Diet recommendation: Regular  Filed Weights   10/17/13 0228  Weight: 48.263 kg (106 lb 6.4 oz)    History of present illness:  Pt is a 57 y.o. female with history of COPD, anxiety and ongoing tobacco abuse had a mechanical fall in the parking lot. Patient was brought to the ER and was found to have left avulsion fracture of the greater trochanter and right shoulder dislocation. The right shoulder was reduced by the ER physician. Due to increased pain patient had CT done to make sure there was no hip fracture. Orthopedic on call Dr. Victorino Dike history see the patient in consult and patient has been admitted for pain management. Patient did not hit her head when she fell or lost consciousness. Denied any chest pain or short of breath.    Hospital Course:  Avulsion fracture of trochanter of femur  -As discussed above upon admission ortho patient had x-ray of left hip which  showed avulsion fracture of the greater trochanter on the left proximal femur, a CT scan was done which showed mildly comminuted mildly displaced avulsion fracture involving the left greater femoral trochanter, no definite femoral neck fracture seen orthopedics was consulted and Dr. Victorino Dike saw the patient and stated the management was non surgical and recommended: -WBAT, PT following  -pain management  -PT saw patient and recommended home health services -She is clinically improved at this time -in control with by mouth meds muscle relaxants. She'll be discharged at this time for outpatient followup with orthopedics in 2 weeks Active Problems:  Shoulder dislocation  -Patient was seen per ortho>> recommendation was on nonsurgical management, PT following  -pain management  Anxiety  -Continue outpt meds  S/p fall  -PT/OT recommended HH Osteoporosis -Continue calcium supplements, follow up with Dr. Drue Novel for further eval and management as appropriate Procedures:  NONE  Consultations:  Orthopedics-Dr. Victorino Dike  Discharge Exam: Filed Vitals:   10/19/13 0552  BP: 96/56  Pulse: 78  Temp: 97.8 F (36.6 C)  Resp: 16   Exam:  General: alert & oriented x 3 In NAD  Cardiovascular: RRR, nl S1 s2  Respiratory: CTAB  Abdomen: soft +BS NT/ND, no masses palpable  Extremities: No cyanosis and no edema    Discharge Instructions  Discharge Orders   Future Orders Complete By Expires   Diet general  As directed    Increase activity slowly  As directed  Medication List    STOP taking these medications       HYDROcodone-acetaminophen 10-325 MG per tablet  Commonly known as:  NORCO      TAKE these medications       acetaminophen 500 MG tablet  Commonly known as:  TYLENOL  Take 500 mg by mouth every 4 (four) hours as needed for moderate pain or fever. For fever     ALPRAZolam 0.5 MG tablet  Commonly known as:  XANAX  Take 0.5-1 tablets (0.25-0.5 mg total) by mouth at bedtime  as needed. For insomnia.     aspirin 81 MG chewable tablet  Chew 81 mg by mouth daily.     calcium carbonate 600 MG Tabs tablet  Commonly known as:  OS-CAL  Take 1 tablet (600 mg total) by mouth 2 (two) times daily with a meal.     feeding supplement (RESOURCE BREEZE) Liqd  Take 1 Container by mouth 3 (three) times daily between meals.     Magnesium 65 MG Tabs  Take 65 mg by mouth daily.     methocarbamol 500 MG tablet  Commonly known as:  ROBAXIN  Take 1 tablet (500 mg total) by mouth every 6 (six) hours as needed for muscle spasms.     NICORETTE 4 MG lozenge  Generic drug:  nicotine polacrilex  Place 4 mg inside cheek every 3 (three) hours as needed. For nicotine cravings.     ondansetron 4 MG tablet  Commonly known as:  ZOFRAN  Take 1 tablet (4 mg total) by mouth every 8 (eight) hours as needed for nausea.     oxyCODONE-acetaminophen 5-325 MG per tablet  Commonly known as:  PERCOCET/ROXICET  Take 1-2 tablets by mouth every 4 (four) hours as needed for severe pain.     pantoprazole 40 MG tablet  Commonly known as:  PROTONIX  Take 40 mg by mouth daily.     Zinc 50 MG Tabs  Take 50 mg by mouth daily.       Allergies  Allergen Reactions  . Alendronate Sodium Other (See Comments)    REACTION: chest pain  . Antihistamines, Chlorpheniramine-Type Other (See Comments)    REACTION: "makes lungs bleed"  . Benadryl [Diphenhydramine] Other (See Comments)    REACTION: "makes lungs bleed"  . Nsaids Other (See Comments)    REACTION: ischemic colitis  . Other Other (See Comments)    All anti-inflammatories.  REACTION: ischemic colitis  . Pseudoephedrine Other (See Comments)    REACTION: Ischemic colitis  . Robitussin A-C [Guaifenesin-Codeine] Itching and Other (See Comments)    Palms itching  . Levofloxacin Anxiety       Follow-up Information   Follow up with [facility]. (home health physical and occupational therapy)    Contact information:   [address] [place] [address] [phone]       Follow up with Willow Ora, MD. (in 1-2weeks, call for appt)    Specialty:  Internal Medicine   Contact information:   [phone] [address] [facility] [address] [place] [address] [phone]       Follow up with Toni Arthurs, MD. (in 2weeks, call for appt)    Specialty:  Orthopedic Surgery   Contact information:   [address] [place] [address] [phone]        The results of significant diagnostics from this hospitalization (including imaging, microbiology, ancillary and laboratory) are listed below for reference.    Significant Diagnostic  Studies: Dg Shoulder Right  10/16/2013   CLINICAL DATA:  Right shoulder  EXAM: RIGHT SHOULDER - 2+ VIEW  COMPARISON:  Similar film from earlier in the same day.  FINDINGS: Humeral head appears well located. On the Y view it does appear somewhat posteriorly oriented although no true dislocation is seen. Degenerative changes of the acromioclavicular joint are noted.   Electronically Signed   By: Alcide Clever M.D.   On: 10/16/2013 22:07   Dg Shoulder Right  10/16/2013   CLINICAL DATA:  Larey Seat.  Shoulder pain.  EXAM: RIGHT SHOULDER - 2+ VIEW  COMPARISON:  None.  FINDINGS: Findings suspicious for a posterior dislocation. No acute fractures identified. The right lung is clear. Surgical changes are noted. The [facility] joint is intact.  IMPRESSION: Suspect posterior dislocation.  No definite fracture.   Electronically Signed   By: Loralie Champagne M.D.   On: 10/16/2013 19:23   Dg Hip Complete Left  10/16/2013   CLINICAL DATA:  Left hip pain  EXAM: LEFT HIP - COMPLETE 2+ VIEW  COMPARISON:  None.  FINDINGS: There is an avulsion fracture of the greater trochanter identified with only minimal displacement medially. No other fracture is seen. The pelvic ring is intact. No gross soft tissue abnormality is noted.  IMPRESSION: Avulsion fracture of the greater trochanter on the left proximal  femur.   Electronically Signed   By: Alcide Clever M.D.   On: 10/16/2013 19:13   Ct Hip Left Wo Contrast  10/16/2013   CLINICAL DATA:  Status post fall; known avulsion fracture at the left hip.  EXAM: CT OF THE LEFT HIP WITHOUT CONTRAST  TECHNIQUE: Multidetector CT imaging was performed according to the standard protocol. Multiplanar CT image reconstructions were also generated.  COMPARISON:  Left hip radiographs performed earlier today at 06:38 p.m.  FINDINGS: There is a mildly comminuted mildly displaced avulsion fracture involving left greater femoral trochanter. No definite femoral neck fracture is identified, though if there is persistent significant clinical concern for occult femoral neck fracture, MRI could be considered for further evaluation. The left femoral head remains seated at the acetabulum. The left superior and inferior pubic rami are intact. The pubic symphysis is unremarkable in appearance. The left sacroiliac joint is incompletely imaged but appears grossly unremarkable.  The visualized small and large bowel are grossly unremarkable in appearance. The bladder is mildly distended and grossly unremarkable. Mild soft tissue injury is noted overlying the left greater femoral trochanter, and about the site of avulsion injury. No significant soft tissue hematoma is seen.  IMPRESSION: 1. Mildly comminuted mildly displaced avulsion fracture involving the left greater femoral trochanter, as noted on radiograph. 2. No definite femoral neck fracture seen, though if there is significant persistent clinical concern for occult femoral neck fracture, MRI could be considered for further evaluation. 3. Mild soft tissue injury noted overlying the left greater femoral trochanter, and about the site of avulsion injury.   Electronically Signed   By: Roanna Raider M.D.   On: 10/16/2013 22:38   Dg Knee Complete 4 Views Left  10/16/2013   CLINICAL DATA:  Left knee pain  EXAM: LEFT KNEE - COMPLETE 4+ VIEW   COMPARISON:  None.  FINDINGS: No acute fracture or dislocation is noted. Mild calcification of the menisci is seen. No joint effusion is noted.  IMPRESSION: No acute abnormality is noted.   Electronically Signed   By: Alcide Clever M.D.   On: 10/16/2013 19:12    Microbiology: No results found  for this or any previous visit (from the past 240 hour(s)).   Labs: Basic Metabolic Panel:  Recent Labs Lab 10/17/13 0030  NA 141  K 3.7  CL 105  CO2 26  GLUCOSE 93  BUN 16  CREATININE 0.77  CALCIUM 8.5   Liver Function Tests:  Recent Labs Lab 10/17/13 0030  AST 15  ALT 12  ALKPHOS 44  BILITOT 0.1*  PROT 6.4  ALBUMIN 3.6   No results found for this basename: LIPASE, AMYLASE,  in the last 168 hours No results found for this basename: AMMONIA,  in the last 168 hours CBC:  Recent Labs Lab 10/17/13 0030  WBC 9.8  HGB 12.2  HCT 35.2*  MCV 101.1*  PLT 321   Cardiac Enzymes: No results found for this basename: CKTOTAL, CKMB, CKMBINDEX, TROPONINI,  in the last 168 hours BNP: BNP (last 3 results) No results found for this basename: PROBNP,  in the last 8760 hours CBG: No results found for this basename: GLUCAP,  in the last 168 hours     Signed:  Kela Millin  [facility] 10/19/2013, 12:33 PM

## 2013-10-22 ENCOUNTER — Encounter: Payer: 59 | Admitting: Internal Medicine

## 2013-10-28 ENCOUNTER — Telehealth: Payer: Self-pay | Admitting: *Deleted

## 2013-10-28 NOTE — Telephone Encounter (Signed)
Patient called and stated that she is under the care of an orthopedic doctor due to her fall at work. Patient states that she has a worker's comp case and had to see an orthopedic.Patient called to let us know that she will be given pain meds under that provider. Patient states that she signed the contract and it states that you can not receive any medication under another physician without letting your primary physician know. Patient has not received any medication at this time but has an upcoming appointment. She was just letting us know ahead of time.

## 2013-10-28 NOTE — Telephone Encounter (Signed)
Noted , thank you, I appreciate the information

## 2013-11-01 ENCOUNTER — Encounter: Payer: 59 | Admitting: Internal Medicine

## 2013-11-21 IMAGING — CR DG CHEST 1V PORT
1 series · 1 of 1 positions shown · non-contrast
Comparison: 11/02/2012

CLINICAL DATA: Postop.  Status post VATS.

PORTABLE CHEST - 1 VIEW

[AP]
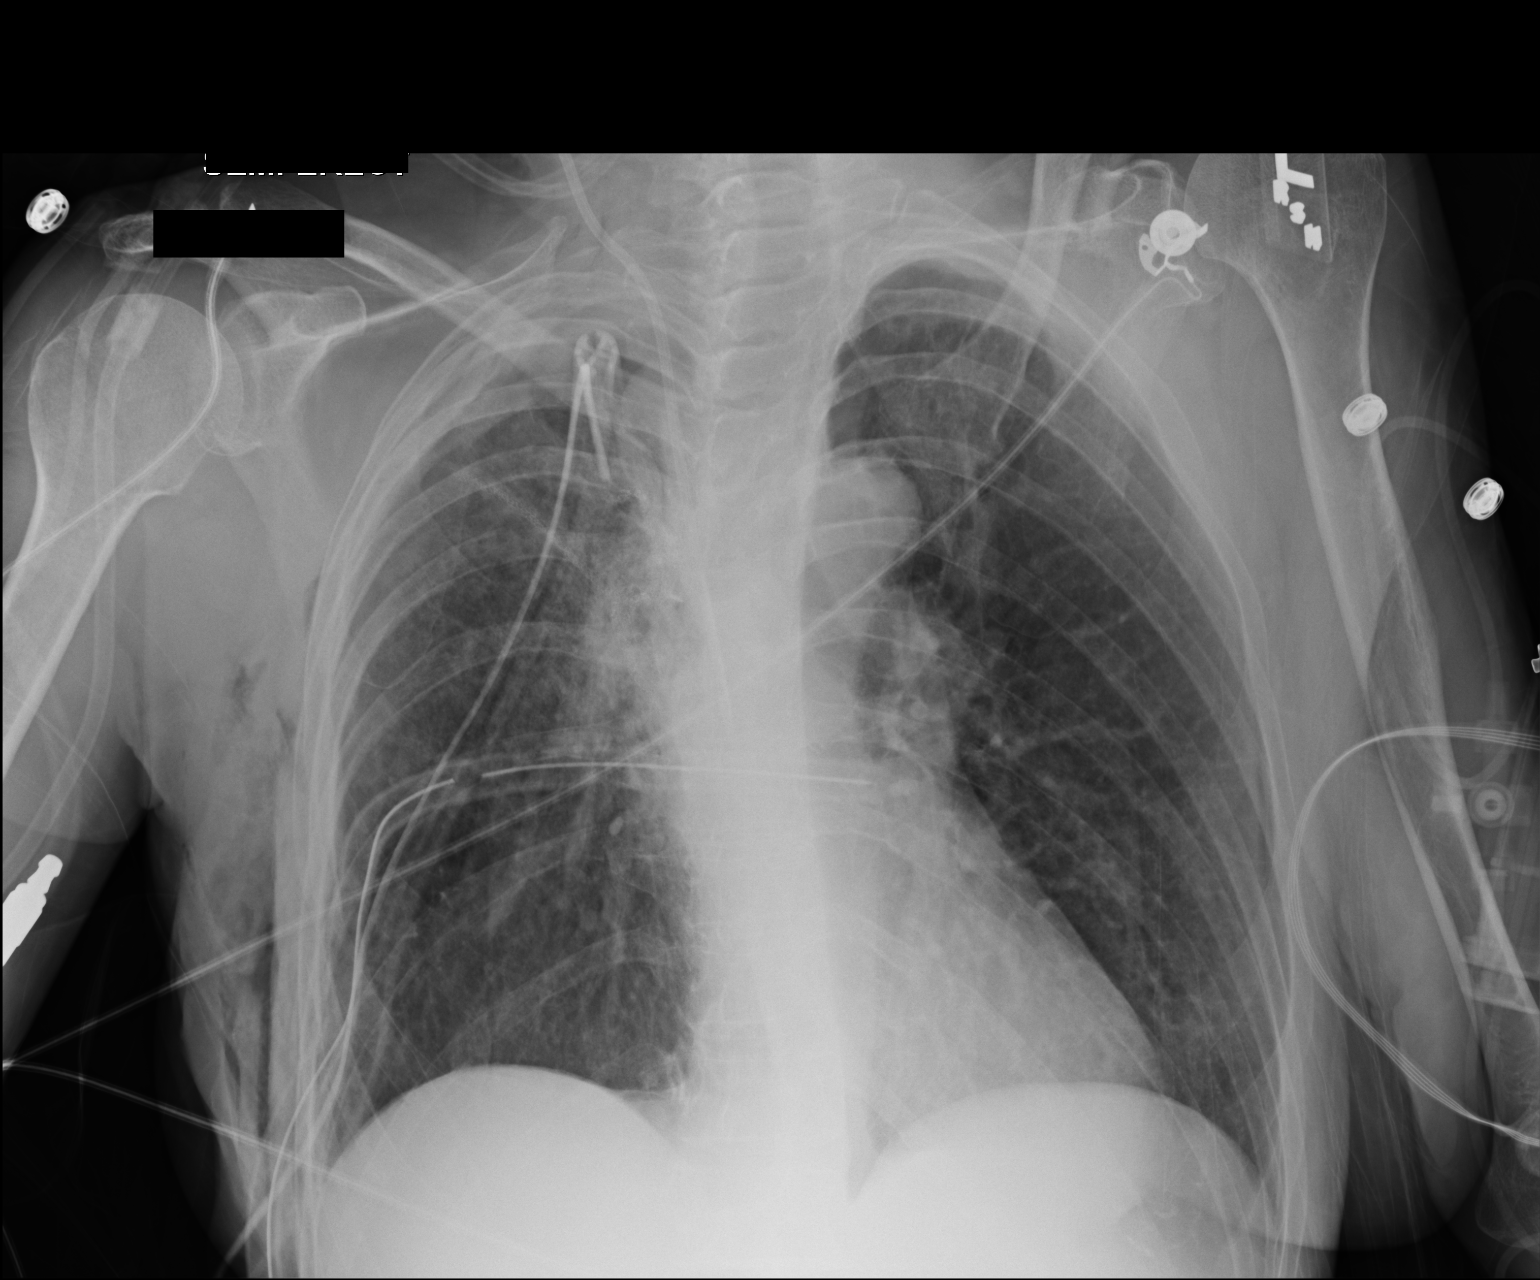

[1 of 1 positions shown; findings below may reference images not displayed]

FINDINGS: Lungs are hyperinflated.  Right-sided IJ central line tip
overlies the level of the superior vena cava.  Dual right-sided
chest tubes are in place.  There is a small right apical
pneumothorax, associated with apical pleural thickening and apical
lung sutures.  Perihilar bronchitic changes are again noted.  No
evidence for pulmonary edema.
IMPRESSION: 1.  Postoperative appearance of the chest.
2.  Small right apical pneumothorax.

## 2013-11-29 IMAGING — CR DG CHEST 1V PORT
1 series · 1 of 1 positions shown · non-contrast
Comparison: 11/12/2012; 11/07/2012; 08/12/2012; chest CT -
08/15/2012

CLINICAL DATA: Right-sided chest tube

PORTABLE CHEST - 1 VIEW

[AP]
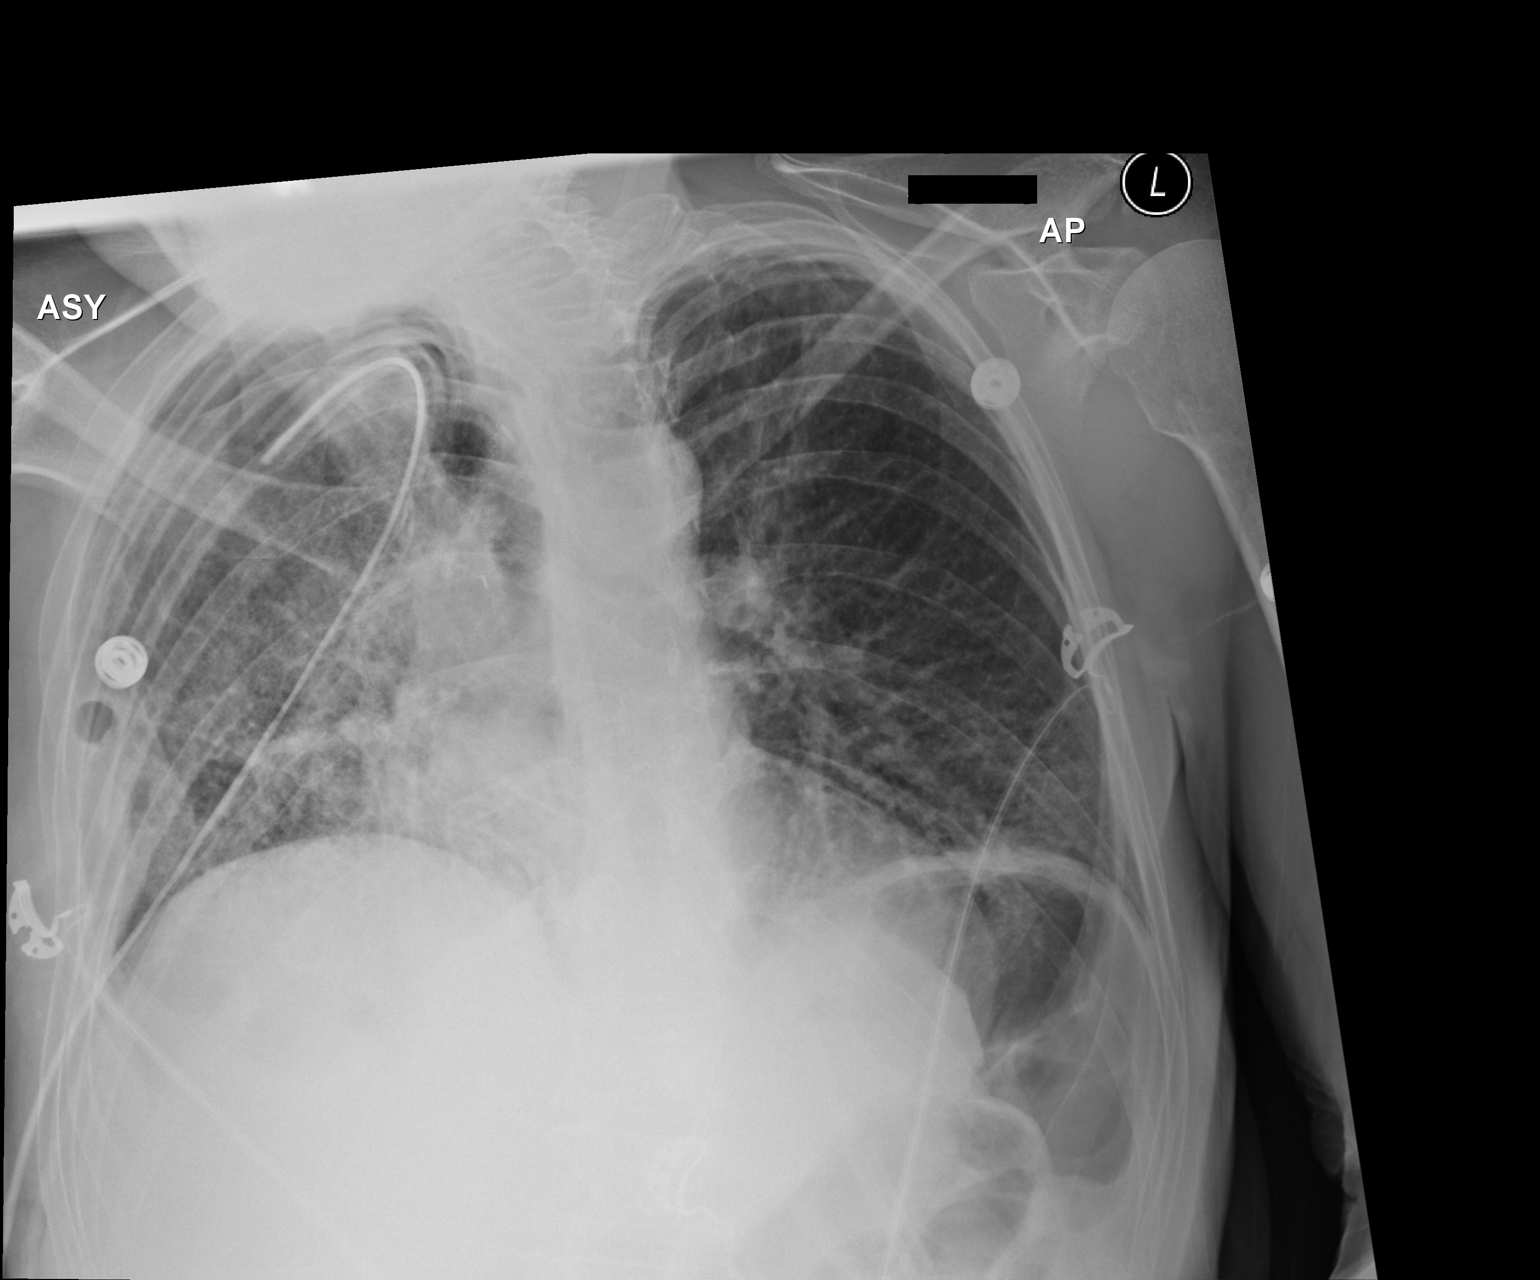

[1 of 1 positions shown; findings below may reference images not displayed]

FINDINGS: Grossly unchanged cardiac silhouette and mediastinal contours given
reduced lung volumes of patient rotation.  Interval removal of
right jugular approach central venous catheter and the more
inferior and laterally positioned right-sided chest tube.  The
apically directed right-sided chest tube is unchanged in position.
Grossly unchanged small right apical pneumothorax.  Heterogeneous
air space opacities within the remaining right lung are grossly
unchanged.  Grossly unchanged left basilar heterogeneous opacities.
No new focal airspace opacity.  Query trace right-sided effusion,
likely unchanged.  Unchanged bones.
IMPRESSION: 1.  Interval removal of support apparatus with unchanged
positioning of remaining apically directed right-sided chest tube.
Grossly unchanged small residual right-sided hydropneumothorax.

2.  Grossly unchanged heterogeneous air space opacities within the
remaining right lung

## 2013-12-05 DIAGNOSIS — S43006A Unspecified dislocation of unspecified shoulder joint, initial encounter: Secondary | ICD-10-CM

## 2013-12-05 HISTORY — DX: Unspecified dislocation of unspecified shoulder joint, initial encounter: S43.006A

## 2013-12-10 ENCOUNTER — Ambulatory Visit: Payer: PRIVATE HEALTH INSURANCE | Attending: Orthopedic Surgery | Admitting: Physical Therapy

## 2013-12-10 DIAGNOSIS — M1990 Unspecified osteoarthritis, unspecified site: Secondary | ICD-10-CM | POA: Insufficient documentation

## 2013-12-10 DIAGNOSIS — M25559 Pain in unspecified hip: Secondary | ICD-10-CM | POA: Insufficient documentation

## 2013-12-10 DIAGNOSIS — R293 Abnormal posture: Secondary | ICD-10-CM | POA: Insufficient documentation

## 2013-12-10 DIAGNOSIS — M25519 Pain in unspecified shoulder: Secondary | ICD-10-CM | POA: Insufficient documentation

## 2013-12-10 DIAGNOSIS — IMO0001 Reserved for inherently not codable concepts without codable children: Secondary | ICD-10-CM | POA: Insufficient documentation

## 2013-12-10 DIAGNOSIS — M810 Age-related osteoporosis without current pathological fracture: Secondary | ICD-10-CM | POA: Insufficient documentation

## 2013-12-11 ENCOUNTER — Telehealth: Payer: Self-pay | Admitting: *Deleted

## 2013-12-11 MED ORDER — HYDROCODONE-ACETAMINOPHEN 10-325 MG PO TABS: ORAL_TABLET | ORAL | Status: DC

## 2013-12-11 NOTE — Telephone Encounter (Signed)
Patient notified. Patient states that she will pick up Rx tomorrow

## 2013-12-11 NOTE — Telephone Encounter (Signed)
Patient called and stated that she is no longer receiving her pain medication from her Ortho (see 11/24 phone note). Patient states that she would like to go back to her regular pain medication which was hydrocodone. Patient states that all of her medication was filled at [facility]. Please advise.SW

## 2013-12-11 NOTE — Telephone Encounter (Signed)
Low risk UDS 02-2013 Rx printed

## 2013-12-12 ENCOUNTER — Ambulatory Visit: Payer: PRIVATE HEALTH INSURANCE | Admitting: Physical Therapy

## 2013-12-16 ENCOUNTER — Encounter: Payer: Self-pay | Admitting: Internal Medicine

## 2013-12-16 ENCOUNTER — Ambulatory Visit (INDEPENDENT_AMBULATORY_CARE_PROVIDER_SITE_OTHER): Payer: 59 | Admitting: Internal Medicine

## 2013-12-16 VITALS — BP 129/85 | HR 100 | Temp 97.9°F | Wt 100.2 lb

## 2013-12-16 DIAGNOSIS — R7989 Other specified abnormal findings of blood chemistry: Secondary | ICD-10-CM

## 2013-12-16 DIAGNOSIS — S72109A Unspecified trochanteric fracture of unspecified femur, initial encounter for closed fracture: Secondary | ICD-10-CM

## 2013-12-16 DIAGNOSIS — D649 Anemia, unspecified: Secondary | ICD-10-CM

## 2013-12-16 DIAGNOSIS — R634 Abnormal weight loss: Secondary | ICD-10-CM

## 2013-12-16 DIAGNOSIS — F17200 Nicotine dependence, unspecified, uncomplicated: Secondary | ICD-10-CM

## 2013-12-16 DIAGNOSIS — R946 Abnormal results of thyroid function studies: Secondary | ICD-10-CM

## 2013-12-16 DIAGNOSIS — A310 Pulmonary mycobacterial infection: Secondary | ICD-10-CM

## 2013-12-16 DIAGNOSIS — Z23 Encounter for immunization: Secondary | ICD-10-CM

## 2013-12-16 IMAGING — CR DG CHEST 2V
2 series · 2 of 2 positions shown · non-contrast
Comparison: 11/26/2012

CLINICAL DATA: Fever.  Chest pain.  Weakness.

CHEST - 2 VIEW

[w chest pa]
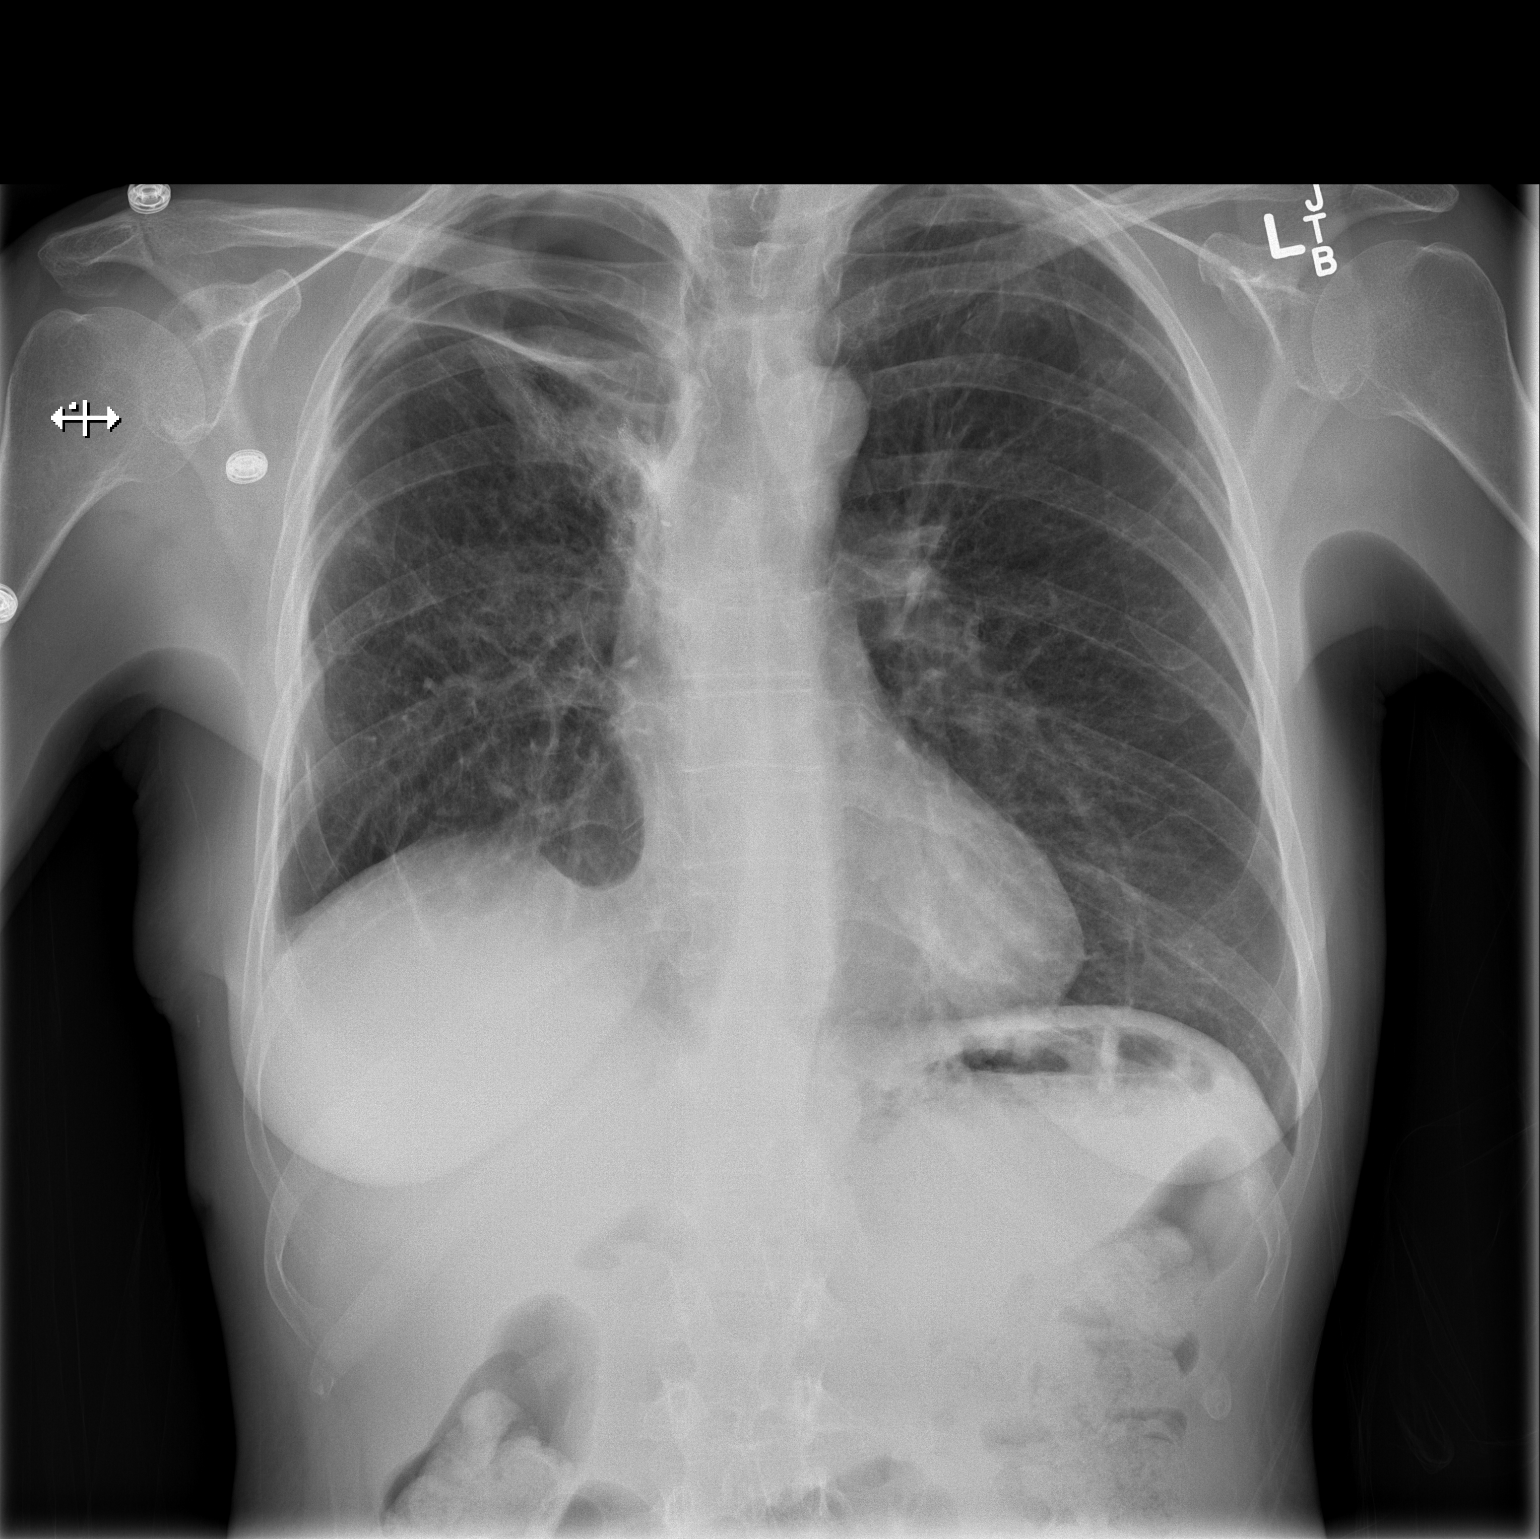

[w chest lat]
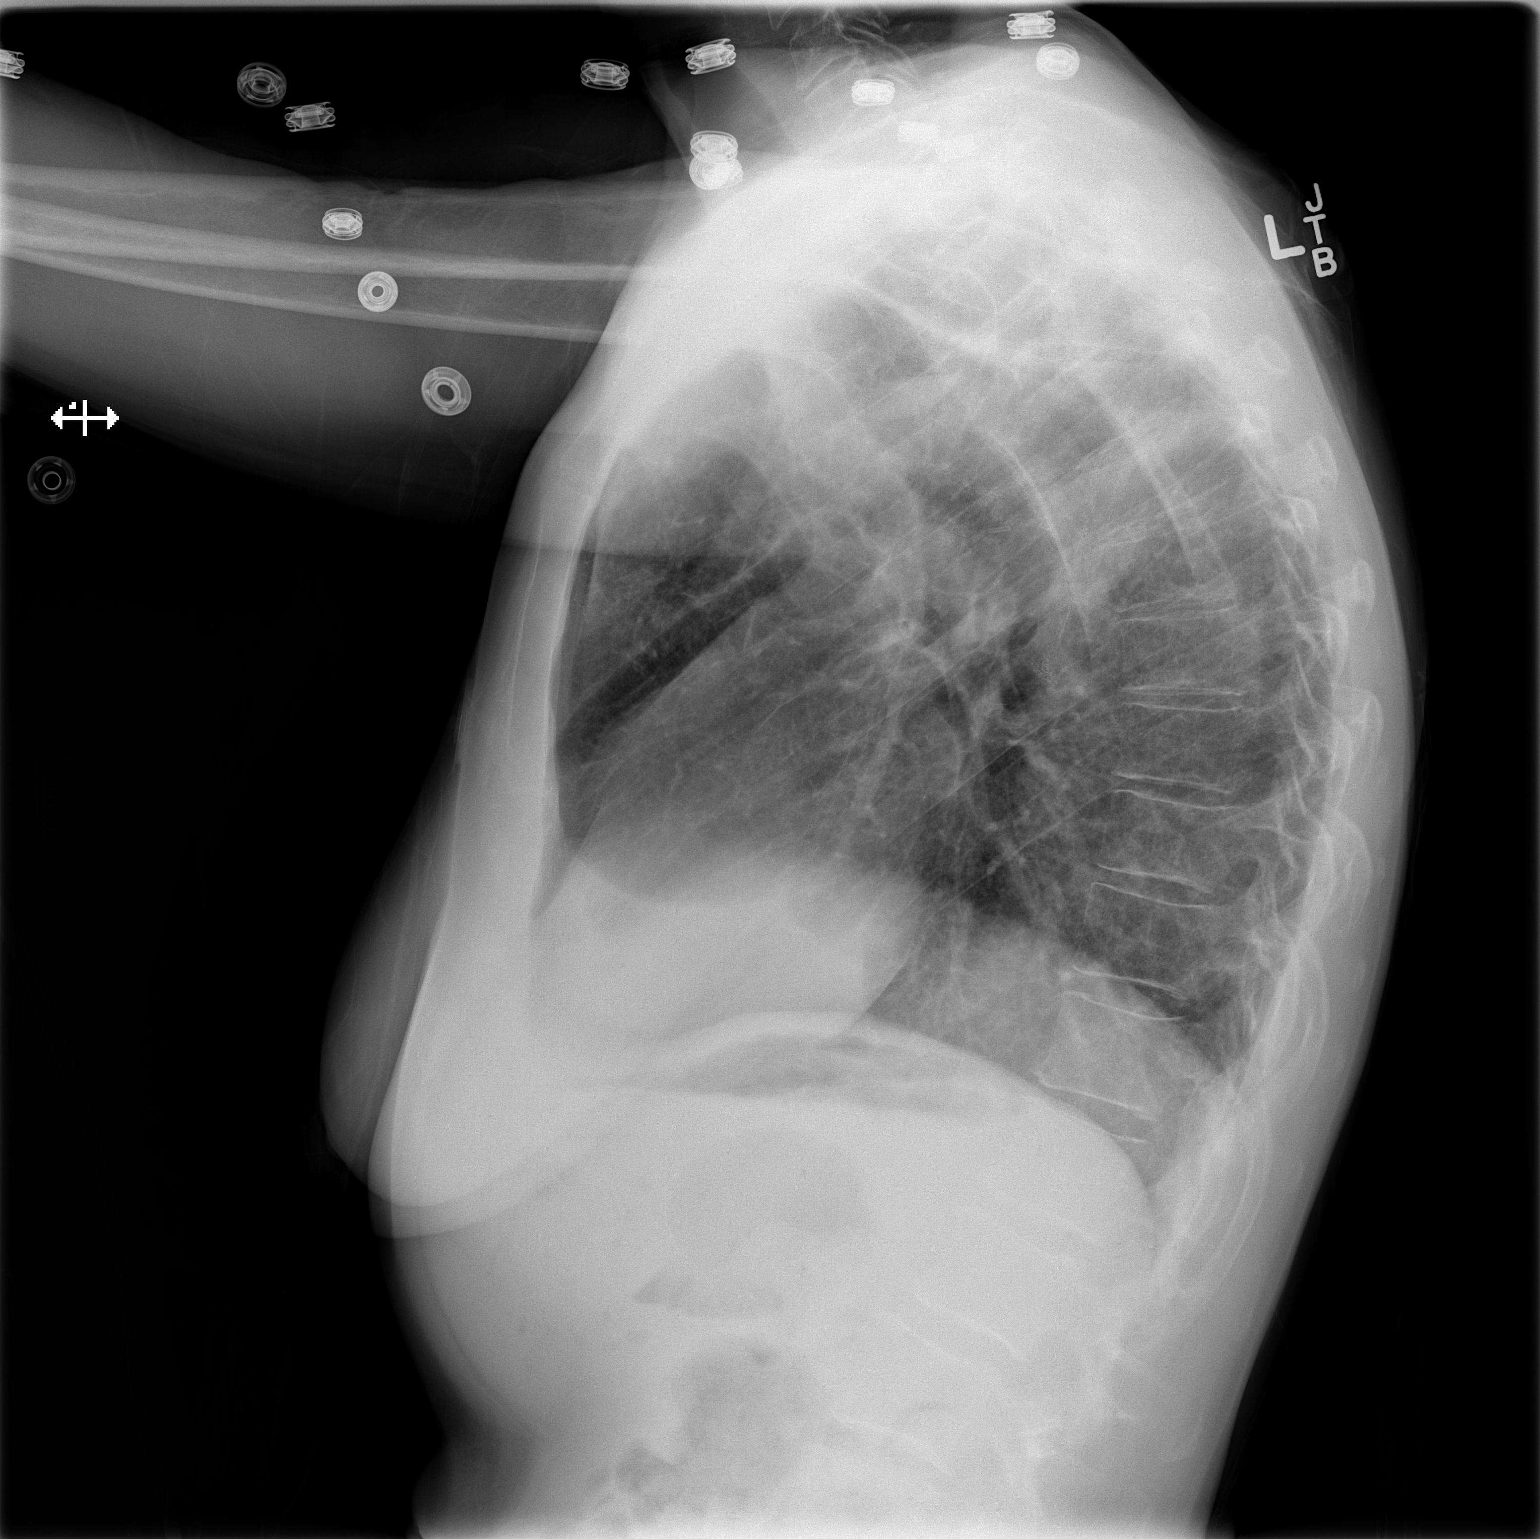

[2 of 2 positions shown; findings below may reference images not displayed]

FINDINGS: Emphysema noted with scarring along the right apical
wedge resections.  Chronic lucency at the right lung apex
previously characterized as a loculated hydropneumothorax, slightly
larger than on prior radiographs although this may be partially
projectional.  There is mild elevation right hemidiaphragm along
with old right rib deformities.
IMPRESSION: 1.  There is slight increase in size of the right apical
hydropneumothorax.
2.  Emphysema.
3.  Stable scarring along the right upper lung wedge resection
sites.

## 2013-12-16 NOTE — Progress Notes (Signed)
Pre visit review using our clinic review tool, if applicable. No additional management support is needed unless otherwise documented below in the visit note. 

## 2013-12-16 NOTE — Progress Notes (Signed)
Subjective:    Patient ID: [name] [name], female    DOB: [birth year], 58 y.o.   MRN: [id number]  HPI Here to discuss the following issues: Weight loss--ongoing weight loss over the last year, weights documented in the chart are confirmatory. Pain--continue with the right lateral chest pain, this is ongoing for several months. Abnormal TFTs--last TSH was slightly suppressed, needs to be rechecked Since the last time she was here, she injured her left hip, not fully recovered yet, is doing physical therapy  Past Medical History  Diagnosis Date  . COPD (chronic obstructive pulmonary disease)     per CT 11/10/05, will minimal bronchiectasis rll  . GERD (gastroesophageal reflux disease)   . Osteoporosis     dexa per gyn  . 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 Headache(784.0)   . Lichen planus   . B12 deficiency   . Endometriosis   . Cataracts, bilateral   . Arthritis   . Lung mass   . Asthma     .  years ago- has not had problem for years.  . Pneumonia     last ime 2008  . Hemorrhoid   . Pneumothorax, spontaneous, tension     x 2 in her 20's.  has a chest tube for one  . 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 Knee surgery      .not replacement .  Marland Kitchen Ovarian cyst removed    . Video bronchoscopy  02/15/2012    Procedure: VIDEO BRONCHOSCOPY WITH FLUORO;  Surgeon: [name], MD;  Location: [facility];  Service: Endoscopy;  Laterality: Bilateral;  WASHING- INTERVENTION BIOPSIES INTERVENTION BRONCHOSCOPY WITH VIDEO  . Mediastinoscopy  05/25/2012    Procedure: MEDIASTINOSCOPY;  Surgeon: [name], MD;  Location: [facility] OR;  Service: Thoracic;  Laterality: N/A;  . Tonsillectomy    . Flexible bronchoscopy  11/05/2012    Procedure: FLEXIBLE BRONCHOSCOPY;  Surgeon: [name], MD;  Location: [place];  Service: Thoracic;  Laterality: N/A;  . Thoracotomy  11/05/2012   Procedure: THORACOTOMY MAJOR;  Surgeon: [name], MD;  Location: [place];  Service: Thoracic;  Laterality: Right;  . Lobectomy  11/05/2012    Procedure: LOBECTOMY;  Surgeon: [name], MD;  Location: [facility] OR;  Service: Thoracic;  Laterality: Right;  right upper lobectomy  . Esophagogastroduodenoscopy  12/03/2012    Procedure: ESOPHAGOGASTRODUODENOSCOPY (EGD);  Surgeon: [name], MD;  Location: [facility] ENDOSCOPY;  Service: Endoscopy;  Laterality: N/A;   History   Social History  . Marital Status: Single    Spouse Name: N/A    Number of Children: 0  . Years of Education: N/A   Occupational History  . HR Coordinator (work paret time )     [employer]    Social History Main Topics  . Smoking status: Current Every Day Smoker -- 0.50 packs/day for 38 years    Types: Cigarettes  . Smokeless tobacco: Never Used     Comment: 3/4 ppd  . Alcohol Use: 0.0 oz/week     Comment: wine occ  . Drug Use: No  . Sexual Activity: Not on file   Other Topics Concern  . Not on file   Social History Narrative   Lives w/ sister    Lost mom 12-2013     Review of Systems Denies fever or chills.No rash. Denies cough, sputum  production, hemoptysis. Loss of her mother few days ago, she is doing okay emotionally. Occasional constipation: Denies diarrhea.    Objective:   Physical Exam BP 129/85  Pulse 100  Temp(Src) 97.9 F (36.6 C)  Wt 100 lb 3.2 oz (45.45 kg)  SpO2 97%  LMP 11/05/2004 General -- alert, Underweight appearing but does not look ill .  Neck --no thyromegaly , normal carotid pulse, no LAD; no mas  HEENT-- Not pale.   Lungs -- normal respiratory effort, no intercostal retractions, no accessory muscle use, and normal breath sounds.  Heart-- normal rate, regular rhythm, no murmur.  Abdomen-- Not distended, good bowel sounds,soft, non-tender.  Extremities-- no pretibial edema bilaterally  Neurologic--  alert & oriented X3. Speech normal, gait limited by L hip injury  Psych--  Cognition and judgment appear intact. Cooperative with normal attention span and concentration. No anxious or depressed appearing.      Assessment & Plan:

## 2013-12-16 NOTE — Assessment & Plan Note (Signed)
Continue with right-sided chest pain, check a chest x-ray

## 2013-12-16 NOTE — Assessment & Plan Note (Addendum)
Today she is again counseled, I discussed Wellbutrin, Chantix. She reports that in the past she got very aggressive while taking Chantix

## 2013-12-16 NOTE — Patient Instructions (Signed)
Get your blood work before you leave   Get the XR at [facility] and [address] (10 minutes form here); they are open 24/7 [place], [place] [address] [phone]  Next visit is for a physical exam ,fasting at your earliest convenience  Please make an appointment

## 2013-12-16 NOTE — Assessment & Plan Note (Addendum)
Documented weight loss----> a year ago she weighed 109 pounds, few months 117 pounds and today 100 pounds. Denies depression, fever chills. Last TFTs were slightly abnormal. Plan: Recheck TFTs and  hemoglobin.

## 2013-12-16 NOTE — Assessment & Plan Note (Addendum)
Status post left hip injury  10/2013, doing outpatient PT.

## 2013-12-17 ENCOUNTER — Encounter: Payer: PRIVATE HEALTH INSURANCE | Admitting: Rehabilitation

## 2013-12-17 ENCOUNTER — Encounter: Payer: Self-pay | Admitting: Internal Medicine

## 2013-12-17 LAB — T4, FREE: Free T4: 1.2 ng/dL (ref 0.60–1.60)

## 2013-12-17 LAB — TSH: TSH: 0.61 u[IU]/mL (ref 0.35–5.50)

## 2013-12-17 LAB — HEMOGLOBIN: HEMOGLOBIN: 13.5 g/dL (ref 12.0–15.0)

## 2013-12-17 LAB — T3, FREE: T3 FREE: 2.8 pg/mL (ref 2.3–4.2)

## 2013-12-18 ENCOUNTER — Encounter: Payer: PRIVATE HEALTH INSURANCE | Admitting: Rehabilitation

## 2013-12-19 ENCOUNTER — Encounter: Payer: PRIVATE HEALTH INSURANCE | Admitting: Rehabilitation

## 2013-12-23 ENCOUNTER — Ambulatory Visit: Payer: PRIVATE HEALTH INSURANCE | Admitting: Rehabilitation

## 2013-12-25 ENCOUNTER — Ambulatory Visit: Payer: PRIVATE HEALTH INSURANCE | Admitting: Rehabilitation

## 2013-12-25 ENCOUNTER — Encounter: Payer: Self-pay | Admitting: Internal Medicine

## 2014-01-01 ENCOUNTER — Other Ambulatory Visit: Payer: Self-pay | Admitting: Internal Medicine

## 2014-01-02 ENCOUNTER — Ambulatory Visit: Payer: PRIVATE HEALTH INSURANCE | Attending: Orthopedic Surgery | Admitting: Rehabilitation

## 2014-01-02 DIAGNOSIS — M25519 Pain in unspecified shoulder: Secondary | ICD-10-CM | POA: Insufficient documentation

## 2014-01-02 DIAGNOSIS — M1990 Unspecified osteoarthritis, unspecified site: Secondary | ICD-10-CM | POA: Insufficient documentation

## 2014-01-02 DIAGNOSIS — R293 Abnormal posture: Secondary | ICD-10-CM | POA: Insufficient documentation

## 2014-01-02 DIAGNOSIS — IMO0001 Reserved for inherently not codable concepts without codable children: Secondary | ICD-10-CM | POA: Insufficient documentation

## 2014-01-02 DIAGNOSIS — M810 Age-related osteoporosis without current pathological fracture: Secondary | ICD-10-CM | POA: Insufficient documentation

## 2014-01-02 DIAGNOSIS — M25559 Pain in unspecified hip: Secondary | ICD-10-CM | POA: Insufficient documentation

## 2014-01-04 ENCOUNTER — Telehealth: Payer: Self-pay | Admitting: Internal Medicine

## 2014-01-04 NOTE — Telephone Encounter (Signed)
Relevant patient education assigned to patient using Emmi. ° °

## 2014-01-06 ENCOUNTER — Ambulatory Visit: Payer: PRIVATE HEALTH INSURANCE | Attending: Orthopedic Surgery | Admitting: Rehabilitation

## 2014-01-06 DIAGNOSIS — S43006A Unspecified dislocation of unspecified shoulder joint, initial encounter: Secondary | ICD-10-CM | POA: Diagnosis not present

## 2014-01-06 DIAGNOSIS — J4489 Other specified chronic obstructive pulmonary disease: Secondary | ICD-10-CM | POA: Diagnosis not present

## 2014-01-06 DIAGNOSIS — IMO0001 Reserved for inherently not codable concepts without codable children: Secondary | ICD-10-CM | POA: Insufficient documentation

## 2014-01-06 DIAGNOSIS — J449 Chronic obstructive pulmonary disease, unspecified: Secondary | ICD-10-CM | POA: Diagnosis not present

## 2014-01-06 DIAGNOSIS — R293 Abnormal posture: Secondary | ICD-10-CM | POA: Insufficient documentation

## 2014-01-06 DIAGNOSIS — R296 Repeated falls: Secondary | ICD-10-CM

## 2014-01-06 DIAGNOSIS — M1990 Unspecified osteoarthritis, unspecified site: Secondary | ICD-10-CM | POA: Insufficient documentation

## 2014-01-06 DIAGNOSIS — M25559 Pain in unspecified hip: Secondary | ICD-10-CM | POA: Insufficient documentation

## 2014-01-06 DIAGNOSIS — M810 Age-related osteoporosis without current pathological fracture: Secondary | ICD-10-CM | POA: Insufficient documentation

## 2014-01-06 DIAGNOSIS — M25519 Pain in unspecified shoulder: Secondary | ICD-10-CM | POA: Insufficient documentation

## 2014-01-06 DIAGNOSIS — S7290XD Unspecified fracture of unspecified femur, subsequent encounter for closed fracture with routine healing: Secondary | ICD-10-CM | POA: Diagnosis not present

## 2014-01-08 ENCOUNTER — Telehealth: Payer: Self-pay | Admitting: *Deleted

## 2014-01-08 ENCOUNTER — Ambulatory Visit
Admission: RE | Admit: 2014-01-08 | Discharge: 2014-01-08 | Disposition: A | Discharge status: Home / Self Care | Payer: 59 | Source: Ambulatory Visit | Attending: Internal Medicine | Admitting: Internal Medicine

## 2014-01-08 DIAGNOSIS — A310 Pulmonary mycobacterial infection: Secondary | ICD-10-CM

## 2014-01-08 MED ORDER — HYDROCODONE-ACETAMINOPHEN 10-325 MG PO TABS: ORAL_TABLET | ORAL | Status: DC

## 2014-01-08 NOTE — Telephone Encounter (Signed)
HYDROcodone-acetaminophen (NORCO) 10-325 MG per tablet Last refill: 12/11/13 #60, 0 refills Last OV: 12/16/13 UDS up-to-date, low risk

## 2014-01-08 NOTE — Telephone Encounter (Signed)
done

## 2014-01-09 ENCOUNTER — Encounter: Payer: Self-pay | Admitting: Rehabilitation

## 2014-01-13 ENCOUNTER — Ambulatory Visit: Payer: PRIVATE HEALTH INSURANCE | Admitting: Rehabilitation

## 2014-01-15 ENCOUNTER — Encounter: Payer: Self-pay | Admitting: Rehabilitation

## 2014-01-16 ENCOUNTER — Ambulatory Visit: Payer: PRIVATE HEALTH INSURANCE | Admitting: Rehabilitation

## 2014-01-20 ENCOUNTER — Ambulatory Visit: Payer: PRIVATE HEALTH INSURANCE | Admitting: Rehabilitation

## 2014-01-22 ENCOUNTER — Encounter: Payer: Self-pay | Admitting: Rehabilitation

## 2014-01-28 ENCOUNTER — Ambulatory Visit: Payer: PRIVATE HEALTH INSURANCE | Admitting: Rehabilitation

## 2014-01-30 ENCOUNTER — Ambulatory Visit: Payer: PRIVATE HEALTH INSURANCE | Admitting: Rehabilitation

## 2014-02-03 ENCOUNTER — Ambulatory Visit: Payer: PRIVATE HEALTH INSURANCE | Attending: Orthopedic Surgery | Admitting: Rehabilitation

## 2014-02-03 DIAGNOSIS — M25559 Pain in unspecified hip: Secondary | ICD-10-CM | POA: Insufficient documentation

## 2014-02-03 DIAGNOSIS — R293 Abnormal posture: Secondary | ICD-10-CM | POA: Insufficient documentation

## 2014-02-03 DIAGNOSIS — M810 Age-related osteoporosis without current pathological fracture: Secondary | ICD-10-CM | POA: Insufficient documentation

## 2014-02-03 DIAGNOSIS — M1990 Unspecified osteoarthritis, unspecified site: Secondary | ICD-10-CM | POA: Insufficient documentation

## 2014-02-03 DIAGNOSIS — M25519 Pain in unspecified shoulder: Secondary | ICD-10-CM | POA: Insufficient documentation

## 2014-02-05 ENCOUNTER — Ambulatory Visit: Payer: PRIVATE HEALTH INSURANCE | Admitting: Physical Therapy

## 2014-02-07 ENCOUNTER — Telehealth: Payer: Self-pay | Admitting: Internal Medicine

## 2014-02-07 MED ORDER — HYDROCODONE-ACETAMINOPHEN 10-325 MG PO TABS: ORAL_TABLET | ORAL | Status: DC

## 2014-02-07 NOTE — Telephone Encounter (Signed)
done

## 2014-02-07 NOTE — Telephone Encounter (Signed)
rx refill- hydrocodone 10-325mg  ,  Last OV- 12/16/13 Last refilled- 01/08/14 #60 / 0 rf  UDS- 08/27/13 LOW risk

## 2014-02-07 NOTE — Telephone Encounter (Signed)
Patient called and requested a refill for HYDROcodone-acetaminophen (NORCO) 10-325 MG per tablet also she stated that she left a message on the triage line on Wednesday and did not get a response from [place] yet. Please advise

## 2014-02-07 NOTE — Addendum Note (Signed)
Addended by: Kathlene November E on: 02/07/2014 04:55 PM   Modules accepted: Orders

## 2014-02-10 ENCOUNTER — Telehealth: Payer: Self-pay | Admitting: *Deleted

## 2014-02-10 NOTE — Telephone Encounter (Signed)
Signed prescription put up front for patient to pick up. Patient aware. JG//CMA

## 2014-02-10 NOTE — Telephone Encounter (Signed)
Error

## 2014-02-11 ENCOUNTER — Ambulatory Visit: Payer: PRIVATE HEALTH INSURANCE | Admitting: Rehabilitation

## 2014-02-13 ENCOUNTER — Ambulatory Visit: Payer: PRIVATE HEALTH INSURANCE | Admitting: Rehabilitation

## 2014-02-18 ENCOUNTER — Ambulatory Visit: Payer: PRIVATE HEALTH INSURANCE | Admitting: Rehabilitation

## 2014-02-19 ENCOUNTER — Ambulatory Visit: Payer: PRIVATE HEALTH INSURANCE | Admitting: Rehabilitation

## 2014-02-24 ENCOUNTER — Ambulatory Visit: Payer: PRIVATE HEALTH INSURANCE | Admitting: Rehabilitation

## 2014-02-26 ENCOUNTER — Ambulatory Visit: Payer: PRIVATE HEALTH INSURANCE | Admitting: Rehabilitation

## 2014-03-10 ENCOUNTER — Telehealth: Payer: Self-pay | Admitting: Internal Medicine

## 2014-03-10 MED ORDER — HYDROCODONE-ACETAMINOPHEN 10-325 MG PO TABS: ORAL_TABLET | ORAL | Status: DC

## 2014-03-10 NOTE — Telephone Encounter (Signed)
03/10/14  Pt is requesting a refill on rx HYDROcodone-acetaminophen ([place]) 10-325 MG.  Please contact pt when ready.  Thanks.

## 2014-03-10 NOTE — Telephone Encounter (Signed)
rx refill- hydrocodone 10-325mg   Last refilled- 02/07/14 #60 / 0 r f Last OV- 12/16/13  UDS- 08/27/13 LOW risk.

## 2014-03-10 NOTE — Telephone Encounter (Signed)
Pt notified. rx rdy for pick up at front desk.  

## 2014-03-10 NOTE — Telephone Encounter (Signed)
Done

## 2014-04-01 ENCOUNTER — Telehealth: Payer: Self-pay

## 2014-04-01 NOTE — Telephone Encounter (Signed)
Spoke with sister who confirmed for her  CCS--2009--Per Patient MMG--2012? Flu vaccine--08/2013 Tdap--12/2013

## 2014-04-02 ENCOUNTER — Encounter: Payer: Self-pay | Admitting: Internal Medicine

## 2014-04-02 ENCOUNTER — Ambulatory Visit (INDEPENDENT_AMBULATORY_CARE_PROVIDER_SITE_OTHER): Payer: 59 | Admitting: Internal Medicine

## 2014-04-02 VITALS — BP 130/85 | HR 85 | Temp 97.9°F | Ht 66.8 in | Wt 110.0 lb

## 2014-04-02 DIAGNOSIS — M1990 Unspecified osteoarthritis, unspecified site: Secondary | ICD-10-CM

## 2014-04-02 DIAGNOSIS — Z0000 Encounter for general adult medical examination without abnormal findings: Secondary | ICD-10-CM

## 2014-04-02 DIAGNOSIS — Z23 Encounter for immunization: Secondary | ICD-10-CM

## 2014-04-02 DIAGNOSIS — E538 Deficiency of other specified B group vitamins: Secondary | ICD-10-CM

## 2014-04-02 DIAGNOSIS — R7303 Prediabetes: Secondary | ICD-10-CM | POA: Insufficient documentation

## 2014-04-02 DIAGNOSIS — M810 Age-related osteoporosis without current pathological fracture: Secondary | ICD-10-CM

## 2014-04-02 DIAGNOSIS — R7309 Other abnormal glucose: Secondary | ICD-10-CM

## 2014-04-02 DIAGNOSIS — R634 Abnormal weight loss: Secondary | ICD-10-CM

## 2014-04-02 MED ORDER — HYDROCODONE-ACETAMINOPHEN 10-325 MG PO TABS: ORAL_TABLET | ORAL | Status: DC

## 2014-04-02 NOTE — Patient Instructions (Signed)
Get your blood work before you leave    Next visit is for routine check up regards your prescriptions  No need to come back fasting Please make an appointment

## 2014-04-02 NOTE — Assessment & Plan Note (Signed)
On hydrocodone, needs a refill. UDS low 08-2013

## 2014-04-02 NOTE — Assessment & Plan Note (Signed)
A1c 08-2013 was 5.9. Patient aware, recheck her A1c

## 2014-04-02 NOTE — Assessment & Plan Note (Addendum)
Td 05  pneumonia shot 07, 2013  prevnar discussed    Cscope 4-09 , 1 polyp (@ [place])  Was due for rescope 2012, refer to GI, request Dr Johna Roles  PAP-- Nl 08-2009 (last documented 2008) MMG-- Nl 08-2009  refer to see Gyn

## 2014-04-02 NOTE — Progress Notes (Signed)
Subjective:    Patient ID: [name] [name], female    DOB: 1955/12/16, 58 y.o.   MRN: [id number]  DOS:  04/02/2014 Type of  visit:  Complete physical exam  Has other concerns: Likes to review her last chest x-ray report, we did Weight loss, has gained 10 pounds feeling well. One-month history of a "spasm" in the right upper abdomen, usually when she bends over or if she is not able to sit up straight. No sx w/ po intake Also, had itching and whelps (?) at the right lateral chest, put some hydrocortisone cream and Zyrtec, now better but not completely well "like the area is burning"..   ROS difficulty breathing at baseline No nausea, vomiting, diarrhea or blood in the stools. Mild cough at baseline, denies sputum production or hemoptysis. No anxiety depression No dysuria, gross hematuria or difficulty urinating.   Past Medical History  Diagnosis Date  . COPD (chronic obstructive pulmonary disease)     per CT 11/10/05, will minimal bronchiectasis rll  . GERD (gastroesophageal reflux disease)   . Osteoporosis     dexa per gyn  . 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 Headache(784.0)   . Lichen planus   . B12 deficiency   . Endometriosis   . Cataracts, bilateral   . Arthritis   . Asthma     .  years ago- has not had problem for years.  . Hemorrhoid   . Pneumothorax, spontaneous, tension     x 2 in her 20's.  has a chest tube for one  . 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 Knee surgery      .not replacement .  Marland Kitchen Ovarian cyst removed    . Video bronchoscopy  02/15/2012    Procedure: VIDEO BRONCHOSCOPY WITH FLUORO;  Surgeon: [name], MD;  Location: [facility];  Service: Endoscopy;  Laterality: Bilateral;  WASHING- INTERVENTION BIOPSIES INTERVENTION BRONCHOSCOPY WITH VIDEO  . Mediastinoscopy  05/25/2012    Procedure: MEDIASTINOSCOPY;   Surgeon: [name], MD;  Location: [facility] OR;  Service: Thoracic;  Laterality: N/A;  . Tonsillectomy    . Flexible bronchoscopy  11/05/2012    Procedure: FLEXIBLE BRONCHOSCOPY;  Surgeon: [name], MD;  Location: [place];  Service: Thoracic;  Laterality: N/A;  . Thoracotomy  11/05/2012    Procedure: THORACOTOMY MAJOR;  Surgeon: [name], MD;  Location: [place];  Service: Thoracic;  Laterality: Right;  . Lobectomy  11/05/2012    Procedure: LOBECTOMY;  Surgeon: [name], MD;  Location: [facility] OR;  Service: Thoracic;  Laterality: Right;  right upper lobectomy  . Esophagogastroduodenoscopy  12/03/2012    Procedure: ESOPHAGOGASTRODUODENOSCOPY (EGD);  Surgeon: [name], MD;  Location: [facility] ENDOSCOPY;  Service: Endoscopy;  Laterality: N/A;    History   Social History  . Marital Status: Single    Spouse Name: N/A    Number of Children: 0  . Years of Education: N/A   Occupational History  . 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 [place]   Social History Main Topics  . Smoking status: Current Every Day Smoker -- 0.50 packs/day for 38 years    Types: Cigarettes  . Smokeless tobacco: Never Used     Comment: 3/4 ppd  . Alcohol Use: 0.0 oz/week  Comment: wine occ  . Drug Use: No  . Sexual Activity: Not on file   Other Topics Concern  . Not on file   Social History Narrative   sister lives w/ pt    Lost mom 12-2013     Family History  Problem Relation Age of Onset  . Colon cancer Neg Hx   . Breast cancer Neg Hx   . Diabetes Mother     GM  . Heart disease Mother     in her 80s  . Heart disease Maternal Grandfather   . Cancer Father     sarcoma  . Diabetes Other     multiple       Medication List       This list is accurate as of: 04/02/14  6:34 PM.  Always use your most recent med list.               acetaminophen 500 MG tablet  Commonly known as:  TYLENOL  Take 500 mg by mouth every 4 (four) hours as needed for moderate pain or fever. For fever       ALPRAZolam 0.5 MG tablet  Commonly known as:  XANAX  Take 0.5-1 tablets (0.25-0.5 mg total) by mouth at bedtime as needed. For insomnia.     aspirin 81 MG chewable tablet  Chew 81 mg by mouth daily.     calcium carbonate 600 MG Tabs tablet  Commonly known as:  OS-CAL  Take 1 tablet (600 mg total) by mouth 2 (two) times daily with a meal.     HYDROcodone-acetaminophen 10-325 MG per tablet  Commonly known as:  NORCO  TAKE 1 TABLET BY MOUTH EVERY 6 HOURS AS NEEDED FOR PAIN     Magnesium 65 MG Tabs  Take 65 mg by mouth daily.     methocarbamol 500 MG tablet  Commonly known as:  ROBAXIN  Take 1 tablet (500 mg total) by mouth every 6 (six) hours as needed for muscle spasms.     NICORETTE 4 MG lozenge  Generic drug:  nicotine polacrilex  Place 4 mg inside cheek every 3 (three) hours as needed. For nicotine cravings.     pantoprazole 40 MG tablet  Commonly known as:  PROTONIX  TAKE 1 TABLET BY MOUTH TWICE DAILY BEFORE A MEAL     Zinc 50 MG Tabs  Take 50 mg by mouth daily.           Objective:   Physical Exam BP 130/85  Pulse 85  Temp(Src) 97.9 F (36.6 C)  Ht 5' 6.8" (1.697 m)  Wt 110 lb (49.896 kg)  BMI 17.33 kg/m2  SpO2 96%  LMP 11/05/2004 General -- alert, well-developed, NAD.  Neck --no thyromegaly , no mass  HEENT-- Not pale.  Lungs -- normal respiratory effort, no intercostal retractions, no accessory muscle use, and normal breath sounds.  Chest wall-- well healed surgical scars on the right, mild numbness to palpation, no redness or discharge. Heart-- normal rate, regular rhythm, no murmur.  Abdomen-- Not distended, good bowel sounds,soft, non-tender. Right side abdominal wall  seems more muscular than the left. Otherwise no abnormality. Back -- scoliosis   Extremities-- no pretibial edema bilaterally  Neurologic--  alert & oriented X3. Speech normal, gait abnormal d/t mechanical issues    Psych-- Cognition and judgment appear intact. Cooperative with  normal attention span and concentration. No anxious or depressed appearing.        Assessment & Plan:   R chest  burning-- observation, related to nerve damage from surgery Abd pain, likely muscle skeletal, rec observation

## 2014-04-02 NOTE — Assessment & Plan Note (Signed)
B 12 levels  consistently normal

## 2014-04-02 NOTE — Assessment & Plan Note (Signed)
Resolved since the last time she was here. Gained 10 pounds

## 2014-04-02 NOTE — Assessment & Plan Note (Addendum)
Not recent bone density test, intolerant to Fosamax, insurance denied  reclast  before. Plan: Bone density test,vit d, ca and vit po supplements

## 2014-04-02 NOTE — Progress Notes (Signed)
Pre visit review using our clinic review tool, if applicable. No additional management support is needed unless otherwise documented below in the visit note. 

## 2014-04-03 LAB — BASIC METABOLIC PANEL
BUN: 11 mg/dL (ref 6–23)
CALCIUM: 9.6 mg/dL (ref 8.4–10.5)
CO2: 27 mEq/L (ref 19–32)
CREATININE: 0.6 mg/dL (ref 0.4–1.2)
Chloride: 102 mEq/L (ref 96–112)
GFR: 105.85 mL/min (ref >60.00)
Glucose, Bld: 78 mg/dL (ref 70–99)
Potassium: 3.8 mEq/L (ref 3.5–5.1)
Sodium: 140 mEq/L (ref 135–145)

## 2014-04-03 LAB — LIPID PANEL
Cholesterol: 185 mg/dL (ref 0–200)
HDL: 68.4 mg/dL (ref >39.00)
LDL CALC: 107 mg/dL — AB (ref 0–99)
Total CHOL/HDL Ratio: 3
Triglycerides: 47 mg/dL (ref 0.0–149.0)
VLDL: 9.4 mg/dL (ref 0.0–40.0)

## 2014-04-03 LAB — HEMOGLOBIN A1C: Hgb A1c MFr Bld: 5.4 % (ref 4.6–6.5)

## 2014-04-03 LAB — VITAMIN D 25 HYDROXY (VIT D DEFICIENCY, FRACTURES): VIT D 25 HYDROXY: 10 ng/mL — AB (ref 30–89)

## 2014-04-03 MED ORDER — VITAMIN D (ERGOCALCIFEROL) 1.25 MG (50000 UNIT) PO CAPS: 50000.0000 [IU] | ORAL_CAPSULE | ORAL | Status: DC

## 2014-04-03 NOTE — Addendum Note (Signed)
Addended by: Peggyann Shoals on: 04/03/2014 10:01 AM   Modules accepted: Orders

## 2014-04-14 ENCOUNTER — Ambulatory Visit (INDEPENDENT_AMBULATORY_CARE_PROVIDER_SITE_OTHER)
Admission: RE | Admit: 2014-04-14 | Discharge: 2014-04-14 | Disposition: A | Discharge status: Home / Self Care | Payer: 59 | Source: Ambulatory Visit | Attending: Internal Medicine | Admitting: Internal Medicine

## 2014-04-14 DIAGNOSIS — M810 Age-related osteoporosis without current pathological fracture: Secondary | ICD-10-CM

## 2014-04-17 ENCOUNTER — Telehealth: Payer: Self-pay | Admitting: Internal Medicine

## 2014-04-17 NOTE — Telephone Encounter (Signed)
Advise patient, she has severe osteoporosis with a T score of -4.0.  In addition to exercise, calcium and vitamin D, recommend to start Calvert  one injection twice a year. Please set it up if the patient is agreeable.

## 2014-04-18 ENCOUNTER — Telehealth: Payer: Self-pay

## 2014-04-18 NOTE — Telephone Encounter (Addendum)
Spoke with patient and advised of recommendations. Would like to proceed with PROLIA. Started prior auth--faxed information

## 2014-04-18 NOTE — Telephone Encounter (Signed)
Left message for call back Non identifiable  

## 2014-04-18 NOTE — Telephone Encounter (Signed)
UDS: 04/04/2014 Neg for alprazolam (PRN) Per Dr Larose Kells, low risk

## 2014-04-25 ENCOUNTER — Telehealth: Payer: Self-pay | Admitting: Gastroenterology

## 2014-04-25 NOTE — Telephone Encounter (Signed)
Dr. Fuller Plan do you approve of transfer to Dr. Olevia Perches

## 2014-04-25 NOTE — Telephone Encounter (Signed)
OK with me if DB is taking more patients

## 2014-04-25 NOTE — Telephone Encounter (Signed)
Please tell pt that I will be retiring  In 1 1/2 years , to reconsider.

## 2014-04-25 NOTE — Telephone Encounter (Signed)
Spoke w/Pt---she is going to check the [facility] Website and decide on who she would like to switch her care too and CB

## 2014-04-25 NOTE — Telephone Encounter (Signed)
Dr. Brodie will you accept? 

## 2014-05-07 ENCOUNTER — Other Ambulatory Visit: Payer: Self-pay | Admitting: *Deleted

## 2014-05-07 ENCOUNTER — Encounter: Payer: Self-pay | Admitting: Internal Medicine

## 2014-05-07 ENCOUNTER — Telehealth: Payer: Self-pay | Admitting: *Deleted

## 2014-05-07 MED ORDER — ALPRAZOLAM 0.5 MG PO TABS: 0.2500 mg | ORAL_TABLET | Freq: Every evening | ORAL | Status: DC | PRN

## 2014-05-07 NOTE — Telephone Encounter (Signed)
rx faxed to [facility] outpatient.

## 2014-05-07 NOTE — Telephone Encounter (Signed)
Requesting Alprazolam 0.5mg -Take 1/2 to 1 tablet by mouth at bedtime as needed for insomnia. Last refill:09-11-13;#90,1 Last OV:04-02-14 UDS:04-04-14-Low Please advise.//AB/CMA

## 2014-05-07 NOTE — Telephone Encounter (Signed)
Error//AB/CMA 

## 2014-05-07 NOTE — Telephone Encounter (Signed)
Done

## 2014-05-19 ENCOUNTER — Telehealth: Payer: Self-pay | Admitting: Internal Medicine

## 2014-05-19 MED ORDER — HYDROCODONE-ACETAMINOPHEN 10-325 MG PO TABS: ORAL_TABLET | ORAL | Status: DC

## 2014-05-19 NOTE — Telephone Encounter (Signed)
Caller name: Jeannine  Call back HALPFX:[phone]   Reason for call:  Pt needs refill on RX HYDROcodone-acetaminophen (NORCO) 10-325 MG per tablet    Also!!!  Pt is also wanting to know the status of her Prolia injection .  Can we advise on this as well.

## 2014-05-19 NOTE — Telephone Encounter (Signed)
Rx done x 2  Please schedule prolia

## 2014-05-19 NOTE — Telephone Encounter (Signed)
Patient is requesting a refill on Norco. Last office visit 04/02/14 Last filled 04/02/14 #90 with 0 RF UDS 04/04/14 Low Risk Okay to refill?

## 2014-05-19 NOTE — Telephone Encounter (Signed)
Spoke with pts sister and left a message that Rx was ready to be picked up. Also advised that prolia has been approved by insurance, pt can schedule appt at her convenience.

## 2014-05-19 NOTE — Telephone Encounter (Signed)
Prolia injection has been approved by insurance.

## 2014-05-20 ENCOUNTER — Ambulatory Visit: Payer: Self-pay

## 2014-05-20 NOTE — Telephone Encounter (Signed)
Prolia appointment has been scheduled for 6/22 @ 3:45 Jess G. Has ordered the prolia for the appt.

## 2014-05-26 ENCOUNTER — Telehealth: Payer: Self-pay | Admitting: *Deleted

## 2014-05-26 ENCOUNTER — Ambulatory Visit: Payer: 59

## 2014-05-26 NOTE — Telephone Encounter (Signed)
Patient presented to the office for her Prolia injection. She stated the she did a lot of research over the weekend and has decided not to get Prolia. She said the side effects are very worrisome to her and she did not want to take a chance of feeling worse. Patient states that she has increased her daily Calcium supplements to 2 tablets daily along with the Rx Vit D.   Patient has previously taken Calcitonin nasal spray and is willing to go back on that if you feel it is appropriate. Please advise on instructions for this patient.

## 2014-05-27 ENCOUNTER — Telehealth: Payer: Self-pay | Admitting: Gastroenterology

## 2014-05-27 ENCOUNTER — Encounter: Payer: Self-pay | Admitting: Gastroenterology

## 2014-05-27 NOTE — Telephone Encounter (Signed)
Left detailed message on patient voice mail with Dr. Ethel Rana instructions.

## 2014-05-27 NOTE — Telephone Encounter (Signed)
Dr. Fuller Plan patient had previously asked to switch care from you to Dr. Olevia Perches.  She has decided she would like to continue her care with you.  Is it ok to set up colon with you?

## 2014-05-27 NOTE — Telephone Encounter (Signed)
I am somehow hesitant to prescribe calcitonin, that medication is rarely use, there has been reports that link it to cancer. For now stay  calcium, vitamin D and physical activity. If so desire, refer to endocrinology.

## 2014-05-28 NOTE — Telephone Encounter (Signed)
OK to schedule with me.  

## 2014-06-09 ENCOUNTER — Ambulatory Visit (INDEPENDENT_AMBULATORY_CARE_PROVIDER_SITE_OTHER): Payer: 59 | Admitting: Internal Medicine

## 2014-06-09 ENCOUNTER — Encounter: Payer: Self-pay | Admitting: Internal Medicine

## 2014-06-09 VITALS — BP 102/68 | HR 80 | Temp 98.6°F | Wt 111.3 lb

## 2014-06-09 DIAGNOSIS — J0100 Acute maxillary sinusitis, unspecified: Secondary | ICD-10-CM

## 2014-06-09 MED ORDER — AMOXICILLIN 500 MG PO CAPS: 1000.0000 mg | ORAL_CAPSULE | Freq: Two times a day (BID) | ORAL | Status: DC

## 2014-06-09 NOTE — Progress Notes (Signed)
Subjective:    Patient ID: [name] [name], female    DOB: Oct 09, 1956, 58 y.o.   MRN: [id number]  DOS:  06/09/2014 Type of visit - description: acute  History: Symptoms started a week ago, on and off, symptoms increase last 2 days --->  sinus congestion, yellow and green nasal discharge. Not taking any medications specific for her symptoms.   ROS Denies fever or chills Denies chest pain or difficulty breathing. No nausea, vomiting, diarrhea. Slightly achy at the upper back. Dry cough slightly worse than baseline but no increase in the sputum production.  Past Medical History  Diagnosis Date  . COPD (chronic obstructive pulmonary disease)     per CT 11/10/05, will minimal bronchiectasis rll  . GERD (gastroesophageal reflux disease)   . Osteoporosis     dexa per gyn  . 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 Headache(784.0)   . Lichen planus   . B12 deficiency   . Endometriosis   . Cataracts, bilateral   . Arthritis   . Asthma     .  years ago- has not had problem for years.  . Hemorrhoid   . Pneumothorax, spontaneous, tension     x 2 in her 20's.  has a chest tube for one  . 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 Knee surgery      .not replacement .  Marland Kitchen Ovarian cyst removed    . Video bronchoscopy  02/15/2012    Procedure: VIDEO BRONCHOSCOPY WITH FLUORO;  Surgeon: [name], MD;  Location: [facility];  Service: Endoscopy;  Laterality: Bilateral;  WASHING- INTERVENTION BIOPSIES INTERVENTION BRONCHOSCOPY WITH VIDEO  . Mediastinoscopy  05/25/2012    Procedure: MEDIASTINOSCOPY;  Surgeon: [name], MD;  Location: [facility] OR;  Service: Thoracic;  Laterality: N/A;  . Tonsillectomy    . Flexible bronchoscopy  11/05/2012    Procedure: FLEXIBLE BRONCHOSCOPY;  Surgeon: [name], MD;  Location: [place];  Service: Thoracic;  Laterality: N/A;  . Thoracotomy   11/05/2012    Procedure: THORACOTOMY MAJOR;  Surgeon: [name], MD;  Location: [place];  Service: Thoracic;  Laterality: Right;  . Lobectomy  11/05/2012    Procedure: LOBECTOMY;  Surgeon: [name], MD;  Location: [facility] OR;  Service: Thoracic;  Laterality: Right;  right upper lobectomy  . Esophagogastroduodenoscopy  12/03/2012    Procedure: ESOPHAGOGASTRODUODENOSCOPY (EGD);  Surgeon: [name], MD;  Location: [facility] ENDOSCOPY;  Service: Endoscopy;  Laterality: N/A;    History   Social History  . Marital Status: Single    Spouse Name: N/A    Number of Children: 0  . Years of Education: N/A   Occupational History  . 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 [place]   Social History Main Topics  . Smoking status: Current Every Day Smoker -- 0.50 packs/day for 38 years    Types: Cigarettes  . Smokeless tobacco: Never Used     Comment: 3/4 ppd  . Alcohol Use: 0.0 oz/week     Comment: wine occ  . Drug Use: No  . Sexual Activity: Not on file   Other Topics Concern  . Not on file   Social History Narrative   sister lives w/ pt    Lost mom 12-2013        Medication  List       This list is accurate as of: 06/09/14  5:35 PM.  Always use your most recent med list.               acetaminophen 500 MG tablet  Commonly known as:  TYLENOL  Take 500 mg by mouth every 4 (four) hours as needed for moderate pain or fever. For fever     ALPRAZolam 0.5 MG tablet  Commonly known as:  XANAX  Take 0.5-1 tablets (0.25-0.5 mg total) by mouth at bedtime as needed. For insomnia.     amoxicillin 500 MG capsule  Commonly known as:  AMOXIL  Take 2 capsules (1,000 mg total) by mouth 2 (two) times daily.     aspirin 81 MG chewable tablet  Chew 81 mg by mouth daily.     Biotin 1000 MCG tablet  Take 1,000 mcg by mouth 3 (three) times daily.     calcium carbonate 750 MG chewable tablet  Commonly known as:  TUMS EX  Chew 1 tablet by mouth 2 (two) times daily.     cholecalciferol  1000 UNITS tablet  Commonly known as:  VITAMIN D  Take 1,000 Units by mouth daily.     HYDROcodone-acetaminophen 10-325 MG per tablet  Commonly known as:  NORCO  TAKE 1 TABLET BY MOUTH EVERY 6 HOURS AS NEEDED FOR PAIN     methocarbamol 500 MG tablet  Commonly known as:  ROBAXIN  Take 1 tablet (500 mg total) by mouth every 6 (six) hours as needed for muscle spasms.     NICORETTE 4 MG lozenge  Generic drug:  nicotine polacrilex  Place 4 mg inside cheek every 3 (three) hours as needed. For nicotine cravings.     pantoprazole 40 MG tablet  Commonly known as:  PROTONIX  TAKE 1 TABLET BY MOUTH TWICE DAILY BEFORE A MEAL     Vitamin D (Ergocalciferol) 50000 UNITS Caps capsule  Commonly known as:  DRISDOL  Take 1 capsule (50,000 Units total) by mouth every 7 (seven) days.           Objective:   Physical Exam BP 102/68  Pulse 80  Temp(Src) 98.6 F (37 C) (Oral)  Wt 111 lb 4.8 oz (50.485 kg)  SpO2 97%  LMP 11/05/2004 General -- alert, well-developed, NAD.  Neck --no  Mass or LAD HEENT-- Not pale. TMs normal, throat symmetric, no redness or discharge. Face symmetric, sinuses   tender to palpation, maxilary L>>R. Nose congested. Lungs -- normal respiratory effort, no intercostal retractions, no accessory muscle use, and normal breath sounds.  Heart-- normal rate, regular rhythm, no murmur.   Extremities-- no pretibial edema bilaterally  Neurologic--  alert & oriented X3. Speech normal. Psych-- Cognition and judgment appear intact. Cooperative with normal attention span and concentration. No anxious or depressed appearing.       Assessment & Plan:   Acute sinusitis, start amoxicillin, see instructions

## 2014-06-09 NOTE — Patient Instructions (Signed)
Rest, fluids , tylenol If  cough, take Mucinex DM twice a day as needed  For nasal congestion use OTC Nasocort: 2 nasal sprays on each side of the nose daily until you feel better  Take the antibiotic as prescribed  (Amoxicillin) Call if no better in few days Call anytime if the symptoms are severe

## 2014-06-16 ENCOUNTER — Emergency Department (HOSPITAL_COMMUNITY): Payer: 59

## 2014-06-16 ENCOUNTER — Telehealth: Payer: Self-pay | Admitting: Internal Medicine

## 2014-06-16 ENCOUNTER — Emergency Department (HOSPITAL_COMMUNITY)
Admission: EM | Admit: 2014-06-16 | Discharge: 2014-06-16 | Disposition: A | Discharge status: Home / Self Care | Payer: 59 | Attending: Emergency Medicine | Admitting: Emergency Medicine

## 2014-06-16 ENCOUNTER — Encounter (HOSPITAL_COMMUNITY): Payer: Self-pay | Admitting: Emergency Medicine

## 2014-06-16 DIAGNOSIS — R042 Hemoptysis: Secondary | ICD-10-CM | POA: Insufficient documentation

## 2014-06-16 DIAGNOSIS — K219 Gastro-esophageal reflux disease without esophagitis: Secondary | ICD-10-CM | POA: Insufficient documentation

## 2014-06-16 DIAGNOSIS — F17200 Nicotine dependence, unspecified, uncomplicated: Secondary | ICD-10-CM | POA: Insufficient documentation

## 2014-06-16 DIAGNOSIS — Z79899 Other long term (current) drug therapy: Secondary | ICD-10-CM | POA: Insufficient documentation

## 2014-06-16 DIAGNOSIS — H269 Unspecified cataract: Secondary | ICD-10-CM | POA: Insufficient documentation

## 2014-06-16 DIAGNOSIS — M129 Arthropathy, unspecified: Secondary | ICD-10-CM | POA: Insufficient documentation

## 2014-06-16 DIAGNOSIS — R079 Chest pain, unspecified: Secondary | ICD-10-CM | POA: Insufficient documentation

## 2014-06-16 DIAGNOSIS — Z872 Personal history of diseases of the skin and subcutaneous tissue: Secondary | ICD-10-CM | POA: Insufficient documentation

## 2014-06-16 DIAGNOSIS — Z8742 Personal history of other diseases of the female genital tract: Secondary | ICD-10-CM | POA: Insufficient documentation

## 2014-06-16 DIAGNOSIS — Z7982 Long term (current) use of aspirin: Secondary | ICD-10-CM | POA: Insufficient documentation

## 2014-06-16 DIAGNOSIS — J45901 Unspecified asthma with (acute) exacerbation: Secondary | ICD-10-CM

## 2014-06-16 DIAGNOSIS — Z8679 Personal history of other diseases of the circulatory system: Secondary | ICD-10-CM | POA: Insufficient documentation

## 2014-06-16 DIAGNOSIS — J441 Chronic obstructive pulmonary disease with (acute) exacerbation: Secondary | ICD-10-CM | POA: Insufficient documentation

## 2014-06-16 DIAGNOSIS — Z8619 Personal history of other infectious and parasitic diseases: Secondary | ICD-10-CM | POA: Insufficient documentation

## 2014-06-16 DIAGNOSIS — Z792 Long term (current) use of antibiotics: Secondary | ICD-10-CM | POA: Insufficient documentation

## 2014-06-16 LAB — CBC
HCT: 38.2 % (ref 36.0–46.0)
Hemoglobin: 13 g/dL (ref 12.0–15.0)
MCH: 34.4 pg — ABNORMAL HIGH (ref 26.0–34.0)
MCHC: 34 g/dL (ref 30.0–36.0)
MCV: 101.1 fL — ABNORMAL HIGH (ref 78.0–100.0)
Platelets: 369 K/uL (ref 150–400)
RBC: 3.78 MIL/uL — ABNORMAL LOW (ref 3.87–5.11)
RDW: 12.7 % (ref 11.5–15.5)
WBC: 8.3 K/uL (ref 4.0–10.5)

## 2014-06-16 LAB — BASIC METABOLIC PANEL
Anion gap: 17 — ABNORMAL HIGH (ref 5–15)
BUN: 6 mg/dL (ref 6–23)
CHLORIDE: 102 meq/L (ref 96–112)
CO2: 25 mEq/L (ref 19–32)
Calcium: 10.1 mg/dL (ref 8.4–10.5)
Creatinine, Ser: 0.66 mg/dL (ref 0.50–1.10)
GFR calc Af Amer: >90 mL/min (ref >90)
GFR calc non Af Amer: >90 mL/min (ref >90)
Glucose, Bld: 103 mg/dL — ABNORMAL HIGH (ref 70–99)
POTASSIUM: 4.1 meq/L (ref 3.7–5.3)
Sodium: 144 mEq/L (ref 137–147)

## 2014-06-16 LAB — PROTIME-INR
INR: 1.07 (ref 0.00–1.49)
Prothrombin Time: 13.9 s (ref 11.6–15.2)

## 2014-06-16 MED ORDER — SODIUM CHLORIDE 0.9 % IJ SOLN
INTRAMUSCULAR | Status: AC
  Filled 2014-06-16: qty 50

## 2014-06-16 MED ORDER — MOXIFLOXACIN HCL 400 MG PO TABS: 400.0000 mg | ORAL_TABLET | Freq: Every day | ORAL | Status: DC

## 2014-06-16 MED ORDER — LEVOFLOXACIN 750 MG PO TABS: 750.0000 mg | ORAL_TABLET | Freq: Every day | ORAL | Status: DC

## 2014-06-16 MED ORDER — IOHEXOL 350 MG/ML SOLN
80.0000 mL | Freq: Once | INTRAVENOUS | Status: AC | PRN
  Administered 2014-06-16: 80 mL via INTRAVENOUS

## 2014-06-16 NOTE — Telephone Encounter (Signed)
lmtcb x1 w/ family member  

## 2014-06-16 NOTE — ED Notes (Signed)
Pt. Requested to speak to someone concerning pre-certification for admission.  Registration at the bedside.

## 2014-06-16 NOTE — Discharge Instructions (Signed)
Call for pulmonary appointment  Return if blood increases, you have worsening shortness of breath or any other concern.   Hemoptysis Hemoptysis, which means coughing up blood, can be a sign of a minor problem or a serious medical condition. The blood that is coughed up may come from the lungs and airways. Coughed-up blood can also come from bleeding that occurs outside the lungs and airways. Blood can drain into the windpipe during a severe nosebleed or when blood is vomited from the stomach. Because hemoptysis can be a sign of something serious, a medical evaluation is required. For some people with hemoptysis, no definite cause is ever identified. CAUSES  The most common cause of hemoptysis is bronchitis. Some other common causes include:   A ruptured blood vessel caused by coughing or an infection.   A medical condition that causes damage to the large air passageways (bronchiectasis).   A blood clot in the lungs (pulmonary embolism).   Pneumonia.   Tuberculosis.   Breathing in a small foreign object.   Cancer. For some people with hemoptysis, no definite cause is ever identified.  HOME CARE INSTRUCTIONS  Only take over-the-counter or prescription medicines as directed by your caregiver. Do not use cough suppressants unless your caregiver approves.  If your caregiver prescribes antibiotic medicines, take them as directed. Finish them even if you start to feel better.  Do not smoke. Also avoid secondhand smoke.  Follow up with your caregiver as directed. SEEK IMMEDIATE MEDICAL CARE IF:   You cough up bloody mucus for longer than a week.  You have a blood-producing cough that is severe or getting worse.  You have a blood-producing cough thatcomes and goes over time.  You develop problems with your breathing.   You vomit blood.  You develop bloody or black-colored stools.  You have chest pain.   You develop night sweats.  You feel faint or pass out.   You  have a fever or persistent symptoms for more than 2-3 days.  You have a fever and your symptoms suddenly get worse. MAKE SURE YOU:  Understand these instructions.  Will watch your condition.  Will get help right away if you are not doing well or get worse. Document Released: 01/30/2002 Document Revised: 11/07/2012 Document Reviewed: 09/07/2012 [facility] Patient Information 2015 [place], [place]. This information is not intended to replace advice given to you by your health care provider. Make sure you discuss any questions you have with your health care provider.

## 2014-06-16 NOTE — ED Notes (Signed)
Pt. Returned from CT scan  with an ice pain on her rt. Upper arm.  Rt. Upper arm is swollen compared to lt,. Upper arm.  Pt. Has good sensation to her rt. Arm and fingers.  Good movement.  Pt. Reports mild pain to site.  Ice pak maintained.

## 2014-06-16 NOTE — Telephone Encounter (Signed)
Spoke with patient Pt currently in ED for hemoptysis Patient advised that she needed to have Bronch done asap per Dr Ashok Cordia Pt states that she is supposed to be leaving for [facility] 06/19/14 - 06/22/14 ( pt going to [place] to bury her mother) Pt wanting to know if she needs to have Bronch now while in hospital or if it can wait until she gets back  Spoke with Dr Melvyn Novas, after reviewing chart and CT scan pt has had while in hospital, per Dr Melvyn Novas okay to wait until she returns from trip to have [place].  Pt aware via cell # [phone] Nothing further needed.

## 2014-06-16 NOTE — ED Provider Notes (Signed)
CSN: [id number]     Arrival date & time 06/16/14  8676 History   First MD Initiated Contact with Patient 06/16/14 0830     Chief Complaint  Patient presents with  . coughing up blood      (Consider location/radiation/quality/duration/timing/severity/associated sxs/prior Treatment) HPI 58 year old female with past medical history of Mycobacterium kansasii infection and a lumpectomy presents with coughing up frank blood. Patient states that starting last night she started to cough up blood streaked sputum but then increased and this morning she started to cough clots of blood. Patient states she's had at least 4 episodes of coughing blood. Patient states that she had similar episodes after her lobectomy but since then has not coughed up blood. Patient denies any lightheadedness, headaches, nausea vomiting. Patient states that she has had a recent sinus infection and placed on amoxicillin. Endorses some mild shortness of breath worse with walking and no chest pain  Past Medical History  Diagnosis Date  . COPD (chronic obstructive pulmonary disease)     per CT 11/10/05, will minimal bronchiectasis rll  . GERD (gastroesophageal reflux disease)   . Osteoporosis     dexa per gyn  . 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 Headache(784.0)   . Lichen planus   . B12 deficiency   . Endometriosis   . Cataracts, bilateral   . Arthritis   . Asthma     .  years ago- has not had problem for years.  . Hemorrhoid   . Pneumothorax, spontaneous, tension     x 2 in her 20's.  has a chest tube for one  . 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 Knee surgery      .not replacement .  Marland Kitchen Ovarian cyst removed    . Video bronchoscopy  02/15/2012    Procedure: VIDEO BRONCHOSCOPY WITH FLUORO;  Surgeon: [name], MD;  Location: [facility];  Service: Endoscopy;  Laterality: Bilateral;   WASHING- INTERVENTION BIOPSIES INTERVENTION BRONCHOSCOPY WITH VIDEO  . Mediastinoscopy  05/25/2012    Procedure: MEDIASTINOSCOPY;  Surgeon: [name], MD;  Location: [facility] OR;  Service: Thoracic;  Laterality: N/A;  . Tonsillectomy    . Flexible bronchoscopy  11/05/2012    Procedure: FLEXIBLE BRONCHOSCOPY;  Surgeon: [name], MD;  Location: [facility];  Service: Thoracic;  Laterality: N/A;  . Thoracotomy  11/05/2012    Procedure: THORACOTOMY MAJOR;  Surgeon: [name], MD;  Location: [facility];  Service: Thoracic;  Laterality: Right;  . Lobectomy  11/05/2012    Procedure: LOBECTOMY;  Surgeon: [name], MD;  Location[facility] OR;  Service: Thoracic;  Laterality: Right;  right upper lobectomy  . Esophagogastroduodenoscopy  12/03/2012    Procedure: ESOPHAGOGASTRODUODENOSCOPY (EGD);  Surgeon: [name], MD;  Location: [facility] ENDOSCOPY;  Service: Endoscopy;  Laterality: N/A;   Family History  Problem Relation Age of Onset  . Colon cancer Neg Hx   . Breast cancer Neg Hx   . Diabetes Mother     GM  . Heart disease Mother     in her 82s  . Heart disease Maternal Grandfather   . Cancer Father     sarcoma  . Diabetes Other     multiple   History  Substance Use Topics  . Smoking status: Current Every Day Smoker -- 0.50 packs/day for 38 years  Types: Cigarettes  . Smokeless tobacco: Never Used     Comment: 3/4 ppd  . Alcohol Use: 0.0 oz/week     Comment: wine occ   OB History   Grav Para Term Preterm Abortions TAB SAB Ect Mult Living                 Review of Systems  Constitutional: Negative for activity change.  HENT: Positive for congestion.   Respiratory: Positive for cough. Negative for shortness of breath.        Hemoptysis   Cardiovascular: Negative for chest pain and leg swelling.  Gastrointestinal: Negative for nausea, vomiting, abdominal pain, diarrhea, constipation, blood in stool and abdominal distention.  Genitourinary: Negative for dysuria, flank pain and vaginal  discharge.  Musculoskeletal: Negative for back pain.  Skin: Negative for color change.  Neurological: Negative for syncope and headaches.  Psychiatric/Behavioral: Negative for agitation.      Allergies  Alendronate sodium; Antihistamines, chlorpheniramine-type; Benadryl; Nsaids; Other; Pseudoephedrine; Robitussin a-c; and Levofloxacin  Home Medications   Prior to Admission medications   Medication Sig Start Date End Date Taking? Authorizing Provider  acetaminophen (TYLENOL) 500 MG tablet Take 500 mg by mouth every 4 (four) hours as needed for moderate pain or fever. For fever    Historical Provider, MD  ALPRAZolam Duanne Moron) 0.5 MG tablet Take 0.5-1 tablets (0.25-0.5 mg total) by mouth at bedtime as needed. For insomnia. 05/07/14   Colon Branch, MD  amoxicillin (AMOXIL) 500 MG capsule Take 2 capsules (1,000 mg total) by mouth 2 (two) times daily. 06/09/14   Colon Branch, MD  aspirin 81 MG chewable tablet Chew 81 mg by mouth daily.    Historical Provider, MD  Biotin 1000 MCG tablet Take 1,000 mcg by mouth 3 (three) times daily.    Historical Provider, MD  calcium carbonate (TUMS EX) 750 MG chewable tablet Chew 1 tablet by mouth 2 (two) times daily.    Historical Provider, MD  cholecalciferol (VITAMIN D) 1000 UNITS tablet Take 1,000 Units by mouth daily.    Historical Provider, MD  HYDROcodone-acetaminophen (NORCO) 10-325 MG per tablet TAKE 1 TABLET BY MOUTH EVERY 6 HOURS AS NEEDED FOR PAIN 05/19/14   Colon Branch, MD  methocarbamol (ROBAXIN) 500 MG tablet Take 1 tablet (500 mg total) by mouth every 6 (six) hours as needed for muscle spasms. 10/19/13   Sheila Oats, MD  nicotine polacrilex (NICORETTE) 4 MG lozenge Place 4 mg inside cheek every 3 (three) hours as needed. For nicotine cravings.    Historical Provider, MD  pantoprazole (PROTONIX) 40 MG tablet TAKE 1 TABLET BY MOUTH TWICE DAILY BEFORE A MEAL 01/01/14   Colon Branch, MD  Vitamin D, Ergocalciferol, (DRISDOL) 50000 UNITS CAPS capsule Take 1  capsule (50,000 Units total) by mouth every 7 (seven) days. 04/03/14   Colon Branch, MD   BP 126/98  Pulse 111  Temp(Src) 98.4 F (36.9 C) (Oral)  Resp 22  Ht 5\' 6"  (1.676 m)  Wt 112 lb (50.803 kg)  BMI 18.09 kg/m2  SpO2 97%  LMP 11/05/2004 Physical Exam  Constitutional: She is oriented to person, place, and time. She appears well-developed.  HENT:  Head: Normocephalic.  Eyes: Pupils are equal, round, and reactive to light.  Neck: Neck supple.  Cardiovascular: Normal rate.  Exam reveals no gallop and no friction rub.   No murmur heard. Pulmonary/Chest: Effort normal and breath sounds normal. No respiratory distress.  Chest wall pain with palpation  Sputum  visualizes with streaks of blood  Abdominal: Soft. She exhibits no distension. There is no tenderness. There is no rebound.  Musculoskeletal: She exhibits no edema.  Neurological: She is alert and oriented to person, place, and time.  Skin: Skin is warm.  Psychiatric: She has a normal mood and affect.    ED Course  Procedures (including critical care time) Labs Review Labs Reviewed  CBC - Abnormal; Notable for the following:    RBC 3.78 (*)    MCV 101.1 (*)    MCH 34.4 (*)    All other components within normal limits  BASIC METABOLIC PANEL - Abnormal; Notable for the following:    Glucose, Bld 103 (*)    Anion gap 17 (*)    All other components within normal limits  PROTIME-INR    Imaging Review No results found.   EKG Interpretation None      MDM   Final diagnoses:  Hemoptysis   56 are old female with past medical history of Mycobacterium kansasii infection and lobectomy has not been fully treated for her to patient noncompliance that presents with hemoptysis. Patient states that she was coughing up clots of blood was bright red with associated shortness of breath. Patient evaluated with chest x-ray which revealed a recurrent pneumonia. Likely secondary to resurgence of Mycobacterium kansasii. Patient CBC,  BMP and PT/INR within normal limits. Patient has visible blood in sputum while in the department.  Patient evaluated with CT angiogram study. Patient noted to not have a PE with a new right sided soft tissue density suspicious for atypical infection. Patient was discussed with pulmonology for concern that the current hypothesis could be a sentinel bleed. Dr. Ernst Bowler was contacted and reviewed the imaging. He states that outpatient management is reasonable that the patient can be followed closely in clinic. Initially patient was nervous about discharge secondary to continued streaks in sputum. Patient then called the pulmonologist and was informed that they will not be performing a bronchoscopy in the next day or 2. Patient states that with this information she now feels comfortable with going home and does not want to be admitted under observation status. Patient further instructed to return to the emergency department if she has increased hemoptysis or increased shortness of breath. Patient was then discharged home.     Claudean Severance, MD 06/16/14 1742

## 2014-06-16 NOTE — ED Notes (Signed)
Patient states has had a bad sinus infection for approximately 1 week.   She states it had gone into chest and she had been coughing up yellow/green sputum for a few days.   She states last night she was spitting up blood, no mucous.

## 2014-06-16 NOTE — Telephone Encounter (Signed)
Caller name: Jahlisa Relation to SN:KNLZ Call back number:[phone]   Reason for call:   Pt called in stating that she was coughing up blood.  Indicated that she had a sinus infection last week and she feels it moved down into her chest.  Advised pt that i was going to route them to [facility] to speak with live person to be triaged, but they pt refused, stated that all they would do is tell her to go to ER or UC and she didn't want to go there.  Stated that she would just use best judgement and ended phone call.

## 2014-06-16 NOTE — Progress Notes (Signed)
Subjective: Pt being seen in ED today for hemoptysis and sinus infection CTA chest was performed today Pt has hx RUL lobectomy secondary mycobacterium infection During CT contrast was extravasated into Rt antecubital space Less than 50 cc Asked to evaluate pt   Objective: Vital signs in last 24 hours: Temp:  [98.4 F (36.9 C)] 98.4 F (36.9 C) (07/13 0824) Pulse Rate:  [87-111] 87 (07/13 1143) Resp:  [20-22] 20 (07/13 1143) BP: (110-126)/(72-98) 110/72 mmHg (07/13 1143) SpO2:  [97 %] 97 % (07/13 1143) Weight:  [50.803 kg (112 lb)] 50.803 kg (112 lb) (07/13 0824)    Intake/Output from previous day:   Intake/Output this shift:    PE:  Pts Rt arm is noticeably larger than left with swelling superior to antecubital area NT to palpate No redness Hand and fingers have good sensation; good strength Skin intact without sign of compromise  Lab Results:   Recent Labs  06/16/14 0939  WBC 8.3  HGB 13.0  HCT 38.2  PLT 369   BMET  Recent Labs  06/16/14 0939  NA 144  K 4.1  CL 102  CO2 25  GLUCOSE 103*  BUN 6  CREATININE 0.66  CALCIUM 10.1   PT/INR  Recent Labs  06/16/14 0939  LABPROT 13.9  INR 1.07   ABG No results found for this basename: PHART, PCO2, PO2, HCO3,  in the last 72 hours  Studies/Results: Dg Chest 2 View  06/16/2014   CLINICAL DATA:  Cough and hemoptysis with history of COPD and previous right lung surgery for atypical mycobacterium kanasii infection.  EXAM: CHEST  2 VIEW  COMPARISON:  PA and lateral chest of January 08, 2014  FINDINGS: The lungs are mildly hyperinflated. There postsurgical changes on the right secondary to partial upper lobectomy. New increased interstitial density in the right mid and upper lung is present. The left lung is clear. The heart and mediastinal structures exhibit no acute abnormality. Chronic retraction of the hilar structures superiorly is noted. The bony thorax is unremarkable.  IMPRESSION: Significant chest  x-ray changes since February 2015 in the right hemi thorax worrisome for interstitial pneumonia. This could reflect community-acquired pneumonia but the possibility of reactivation of the patient's old mycobacterial infection is raised. Chest CT scanning and pulmonary/infectious disease consultation is recommended.  These results were called by telephone at the time of interpretation on 06/16/2014 at 9:10 AM to Dr. Claudean Severance , who verbally acknowledged these results.   Electronically Signed   By: David  Martinique   On: 06/16/2014 09:09   Ct Angio Chest Pe W/cm &/or Wo Cm  06/16/2014   CLINICAL DATA:  58 year old female with hemoptysis. History of right upper lobectomy for mycobacterial infection.  EXAM: CT ANGIOGRAPHY CHEST WITH CONTRAST  TECHNIQUE: Multidetector CT imaging of the chest was performed using the standard protocol during bolus administration of intravenous contrast. Multiplanar CT image reconstructions and MIPs were obtained to evaluate the vascular anatomy.  CONTRAST:  150 mL OMNIPAQUE IOHEXOL 350 MG/ML SOLN.  Contrast extravasation of 20-50 cc occurred during the initial scan. Dr. Jeannine Kitten examine the patient and discussed with Dr. Ashok Cordia.  COMPARISON:  06/16/2014 and prior chest radiographs. 11/26/2012 chest CT.  FINDINGS: This is a technically satisfactory study.  No pulmonary emboli are identified.  The heart and great vessels are unremarkable. There is no evidence of thoracic aortic aneurysm.  No pleural or pericardial effusions or enlarged lymph nodes identified.  Postsurgical changes from right upper lobectomy noted.  A persistent 5.5 x  6.6 cm right apical pulmonary cavity is again noted from 2013, but with slightly thickened walls.  A new 5 x 5 cm slightly thick walled cavity is identified along the anterior upper right lung. Within the posterior aspect of this cavity, a 2 x 2.7 x 1.3 cm area soft tissue density is identified and may represent atypical infection (including mycobacterium or  fungal).  New peribronchial/interstitial thickening and ground-glass opacities within the right upper lung are noted and suspicious for atypical infection.  The left lung is clear.  Emphysema changes are present.  No acute or suspicious bony abnormalities are identified.  Review of the MIP images confirms the above findings.  IMPRESSION: No evidence of pulmonary emboli.  New 5 x 5 cm slightly thick-walled cavity within the anterior right upper lung containing 2 x 2.7 x 1.3 cm soft tissue density suspicious for atypical infection (mycobacterium or fungal).  New peribronchial/interstitial thickening and ground-glass opacities within the right upper lung also suspicious for atypical infection.  Unchanged right apical cavity, now with slightly thickened walls which may be related to atypical infection as well.  Emphysema.   Electronically Signed   By: Hassan Rowan M.D.   On: 06/16/2014 11:59    Anti-infectives: Anti-infectives   None      Assessment/Plan: s/p * No surgery found *  Rt arm extravasation in CT with contrast 7/13 Less than 50 cc into tissue Area is swollen NT and no redness Skin intact Ice to area now Rec: elevation Ice to area x 24 hrs Change to warm compress after 24 hrs Reassurance IR PA call pt tomorrow----or see if inpatient   LOS: 0 days    Rafiel Mecca A 06/16/2014

## 2014-06-17 NOTE — Progress Notes (Signed)
Patient ID: [name] [name], female   DOB: 1956/01/09, 58 y.o.   MRN: [id number]   Pt calls today to report on CT contrast extravasation site I had seen pt in ED yesterday after less than 50 cc contrast extrav into Rt antecubital space  She states are seems slightly better Less swelling Skin has developed a redness/purplish tone Tender to touch Hand and fingers still with good sensation and movement  Rec: now use warm compresses to are Continue to elevate arm change in skin color possibly development of bruising Reassurance I will call tomorrow  I did tell pt if she notices any change in skin integrity; swelling worsening; numbness in extr---she is to come to ED or see MD asap. She is agreeable and understands reasoning.

## 2014-06-17 NOTE — Telephone Encounter (Signed)
Pt called this morning to let Larose Kells know she went to [facility] ER yesterday.  She missed work yesterday and today.  Wanted to make you aware we will be receiving FMLA paperwork for micobacterium kansasii.  She also has PNA.  They did CT at the ER and showed 2 lesions in her right lung.  She scheduled appt with Dr. Melvyn Novas 06/24/2014.  She is on 2nd antibiotic given to her at the ER.  If you have any questions, you can reach her at [phone].

## 2014-06-17 NOTE — Telephone Encounter (Signed)
Noted, thank you

## 2014-06-18 NOTE — ED Provider Notes (Signed)
I saw and evaluated the patient, reviewed the resident's note and I agree with the findings and plan.   EKG Interpretation   Date/Time:  Monday June 16 2014 09:33:36 EDT Ventricular Rate:  92 PR Interval:  127 QRS Duration: 80 QT Interval:  348 QTC Calculation: 430 R Axis:   62 Text Interpretation:  Sinus rhythm Non-specific ST-t changes `mild st dep  inf/lat compared to prior ecg Confirmed by Tramar Brueckner  MD, Lennette Bihari ([id number]) on  06/16/2014 12:33:34 PM      Pt c/o hx copd, prior lung surgery, mycobacterium infection non compliant w completion abx, c/o coughing up small amts blood, and mild dyspnea.  Cxr. Labs. Advised admission/calls placed.  Pt refuses admission, understands risks.   Mirna Mires, MD 06/18/14 [phone]

## 2014-06-18 NOTE — Progress Notes (Signed)
Patient ID: [name] [name], female   DOB: Jul 03, 1956, 58 y.o.   MRN: [id number]   Spoke to pt just now via phone Checking on extravasation site  Pt states arm has decreased swelling Area of extrav now purplish - like a bruise Tender to touch Fingers and hand with FROM Good sensation and strength  Call if need [place]

## 2014-06-24 ENCOUNTER — Ambulatory Visit (INDEPENDENT_AMBULATORY_CARE_PROVIDER_SITE_OTHER): Payer: 59 | Admitting: Internal Medicine

## 2014-06-24 ENCOUNTER — Telehealth: Payer: Self-pay | Admitting: Internal Medicine

## 2014-06-24 ENCOUNTER — Encounter: Payer: Self-pay | Admitting: Internal Medicine

## 2014-06-24 ENCOUNTER — Other Ambulatory Visit: Payer: 59

## 2014-06-24 VITALS — BP 120/80 | HR 80 | Temp 98.7°F

## 2014-06-24 DIAGNOSIS — J471 Bronchiectasis with (acute) exacerbation: Secondary | ICD-10-CM

## 2014-06-24 MED ORDER — MOXIFLOXACIN HCL 400 MG PO TABS: 400.0000 mg | ORAL_TABLET | Freq: Every day | ORAL | Status: DC

## 2014-06-24 NOTE — Progress Notes (Signed)
Subjective:    Patient ID: [name] [name], female    DOB: 05/06/1956  MRN: [id number]  HPI    Brief patient profile:  75 yowf active smoker  with h/o COPD/bronchiectasis complicated by pna 37/1696 seen [facility]  pulmonary clinic in 2008 And  Referred back  to pulmonary clinic 01/20/2012 for cavitary mass RUL and h/o bronchiectasis involving the RLL> proved to have M Kansasii on vats Flexible fiberoptic bronchoscopy, right thoracotomy with right upper lobectomy and lower lobe superior segmentectomy 11/05/12 f/u by ID since.    History of Present Illness     06/24/2014 f/u ov/[name] [name] still smoking re: off all rx for [name] [name] on June 2014 and last visit was 06/04/14 with [name] [name] Chief Complaint  Patient presents with  . Follow-up    Pt last seen here 01/20/12. Pt c/o hemoptysis since 06/15/14.  She states having increased DOE for the past 2 wks.   better p stopped meds June 2014 then abruptly ill starting 06/07/14 "sinus infection " rx amox then started coughing up yellow green then turned bloody on 06/15/14 > ED > avelox and some better and wants to know what next. Sob with exertion but not at rest. Some nasal congestion but no epistaxis, still taking asa 81 mg per for ischemic colitis. Not taking any resp meds  Onset of hemoptysis was acute but in setting of increase cough/ congestion with yellow mucus x sev weeks. Total blood less than sev tbsp total per day.  No obvious other patterns in day to day or daytime variabilty or assoc   cp or chest tightness, subjective wheeze overt sinus or hb symptoms. No unusual exp hx or h/o childhood pna/ asthma or knowledge of premature birth.  Sleeping ok without nocturnal  or early am exacerbation  of respiratory  c/o's or need for noct saba. Also denies any obvious fluctuation of symptoms with weather or environmental changes or other aggravating or alleviating factors except as outlined above   Current Medications, Allergies, Complete Past Medical History,  Past Surgical History, Family History, and Social History were reviewed in [employer] record.                Review of Systems  Constitutional: Negative for fever, chills and unexpected weight change.  HENT: Positive for congestion. Negative for dental problem, ear pain, nosebleeds, postnasal drip, rhinorrhea, sinus pressure, sneezing, sore throat, trouble swallowing and voice change.   Eyes: Negative for visual disturbance.  Respiratory: Positive for cough and shortness of breath. Negative for choking.   Cardiovascular: Negative for chest pain and leg swelling.  Gastrointestinal: Negative for vomiting, abdominal pain and diarrhea.  Genitourinary: Negative for difficulty urinating.  Musculoskeletal: Positive for arthralgias.  Skin: Negative for rash.  Neurological: Negative for tremors, syncope and headaches.  Hematological: Does not bruise/bleed easily.       Objective:   Physical Exam  Anxious wf nad Wt 120 01/20/12>  06/24/2014   113 naked per pt   HEENT mild turbinate edema.  Oropharynx no thrush or excess pnd or cobblestoning.  No JVD or cervical adenopathy. Mild accessory muscle hypertrophy. Trachea midline, nl thryroid. Chest was hyperinflated by percussion with diminished breath sounds and mild increased exp time without wheeze. Hoover sign positive at mid inspiration. Regular rate and rhythm without murmur gallop or rub or increase P2 or edema.  Abd: no hsm, nl excursion. Ext warm without cyanosis ? slt clubbing  06/16/14 No evidence of pulmonary emboli.  New 5 x 5  cm slightly thick-walled cavity within the anterior right  upper lung containing 2 x 2.7 x 1.3 cm soft tissue density  suspicious for atypical infection (mycobacterium or fungal).  New peribronchial/interstitial thickening and ground-glass opacities  within the right upper lung also suspicious for atypical infection.  Unchanged right apical cavity, now with slightly thickened walls    which may be related to atypical infection as well.  Emphysema.     Assessment & Plan:                           Assessment & Plan:

## 2014-06-24 NOTE — Telephone Encounter (Signed)
Caller name: [place]  Call back [phone]   Reason for call:   Pt is calling about FMLA paperwork that was faxed on 7/14. Pt has fax confirmation that we received it.  Please call pt back with any updates.

## 2014-06-24 NOTE — Patient Instructions (Addendum)
Alpha one screening should be completed today  If symptoms worsen > avelox 400 mg daily x 10 days  No aspirin until you see the doctors at [facility]   Please see patient coordinator before you leave today  to schedule referral to pulmonary clinic at [facility]

## 2014-06-26 ENCOUNTER — Telehealth: Payer: Self-pay | Admitting: Internal Medicine

## 2014-06-26 LAB — ALPHA-1-ANTITRYPSIN: A-1 Antitrypsin, Ser: 237 mg/dL — ABNORMAL HIGH (ref 83–199)

## 2014-06-26 NOTE — Assessment & Plan Note (Addendum)
-   See CT 11/10/2005 RLL changes    - New changes RUL CT Chest  12/29/2011 ? All related to bronchiectasis >FOB 02/15/12 > neg cyt/ neg tbbx/ no wbc's on gm stain ? [employer]/fungal studies done as requested    - 02/29/12 Rec PET Scan > pt declined > 03/05/2012 rec t surgery eval for RUL obectomy done 11/06/12 > Pos M Kansasii> f/u ID   maint new issue is hemoptysis while smoking on asa for ischemic colitis  rec d/c asa and cigs > ok to resume asa when no blood x 3 days but is at risk of massive recurrance.  No indication for pulmonary meds at present Refer to [facility] for second opinion re management as doing very poorly here and we have nothing to offer she hasn't already tried in ID clinic  In meantime ok to use avelox short course which covers gnr and atypicals   Also rec check alpha one phenotype for EPIC purposes as nothing in the records

## 2014-06-26 NOTE — Telephone Encounter (Signed)
Pt is calling back, has not received any response.

## 2014-06-26 NOTE — Telephone Encounter (Signed)
Office is closed at this time [facility] tomorrow

## 2014-06-26 NOTE — Telephone Encounter (Signed)
LM for patient to call back.  Lm for pulmonary to give a call to discuss FMLA.

## 2014-06-27 NOTE — Telephone Encounter (Signed)
Spoke with patient and advised that PCP out of the office. Advised call into pulmonary. Will advise when we hear back.  Patient agrees with plan.

## 2014-06-27 NOTE — Telephone Encounter (Signed)
Called ext and LMTCB x1

## 2014-06-28 LAB — ALPHA-1 ANTITRYPSIN PHENOTYPE: A-1 Antitrypsin: 215 mg/dL — ABNORMAL HIGH (ref 83–199)

## 2014-06-29 ENCOUNTER — Encounter: Payer: Self-pay | Admitting: Internal Medicine

## 2014-06-30 NOTE — Telephone Encounter (Signed)
Spoke with Dr. Gustavus Bryant office. Assistant will check to see if they will fill out FMLA papers.

## 2014-06-30 NOTE — Telephone Encounter (Signed)
I can fill it out just for the pulmonary problem if that's all she needs, if there are there diagnoses that might lead her to miss work then she should wait for Dr Larose Kells

## 2014-06-30 NOTE — Progress Notes (Signed)
Quick Note:  Spoke with pt and notified of results per Dr. Wert. Pt verbalized understanding and denied any questions.  ______ 

## 2014-06-30 NOTE — Telephone Encounter (Signed)
Pt is wanting to have FMLA papers filled out and I called and spoke with Abner Greenspan at [facility] and she stated that Dr. Larose Kells is out of the office at this time and they will not be able to fill these out for the pt in a timely fashion.  Gay called to see if MW would be willing to fill these forms out for the pt. If so, we will need to call Abner Greenspan and let her know.   MW please advise. Thanks

## 2014-07-01 NOTE — Telephone Encounter (Signed)
Pt would like a callback at [phone]

## 2014-07-01 NOTE — Telephone Encounter (Signed)
Pt called back stating Dr. Gustavus Bryant office will not complete FMLA paperwork

## 2014-07-01 NOTE — Telephone Encounter (Signed)
LMTCB X1 FOR [name] [name]

## 2014-07-01 NOTE — Telephone Encounter (Signed)
Return call.[name] [name] °

## 2014-07-01 NOTE — Telephone Encounter (Signed)
LM for Gay to call back

## 2014-07-01 NOTE — Telephone Encounter (Signed)
  Langley Gauss, from Dr. Melvyn Novas office is calling you.  [phone] ask for Triage.    LM for Langley Gauss to RTC

## 2014-07-02 NOTE — Telephone Encounter (Signed)
Spoke with patient and advised that we are still waiting on pulmonary to respond. Patient will call their office as well.

## 2014-07-02 NOTE — Telephone Encounter (Signed)
Gaye w/ [facility] returned call & can be reached at [phone].  If Dannielle Karvonen is out of the office this afternoon, please ask to speak w/ Angie.  Satira Anis.

## 2014-07-02 NOTE — Telephone Encounter (Signed)
[name] [name], [facility]   [phone] - ask for Triage   Calling to speak with you about patient PA   LM for Pulmonary to CB for Angie.

## 2014-07-02 NOTE — Telephone Encounter (Signed)
lmtcb for gay

## 2014-07-02 NOTE — Telephone Encounter (Signed)
Spoke with Angie at [place] [facility] that they are wanting to fax over [place] forms for Dr Melvyn Novas to fill out Per message below, Dr Melvyn Novas agreed to fill out FMLA forms for Pulmonary related issues only and anything further needed to be addressed by Dr Larose Kells office.  Angie aware of the above and is going to fax the FMLA forms to the triage fax.   Will HOLD in triage until received.

## 2014-07-02 NOTE — Telephone Encounter (Signed)
Spoke with Angie- she wanted to make sure we received the FMLA papers on patient. I assured her we did and that I was handing them to [place] Wert's nurse to have MW take care of. Will forward message to MW.

## 2014-07-02 NOTE — Telephone Encounter (Signed)
Received call from [facility] at [facility] and she stated that Dr. Melvyn Novas said he would fill out the FMLA, but only the part that pertain to pulmonary and the other Dr. Larose Kells will need to complete.  Informed Miquel Dunn that would be good.  Faxed FMLA form to Dr. Melvyn Novas at [phone]).  Received call back from [place] stating that they received the FMLA, and Dr. Melvyn Novas will fill out as much as he can.   She also want to know if there was anything else we needed.  Informed her that was all, and Thanks.//AB/CMA

## 2014-07-02 NOTE — Telephone Encounter (Signed)
LTMCB for [employer]

## 2014-07-02 NOTE — Telephone Encounter (Signed)
Needs paperwork done for er visit [phone] [facility] or call Abner Greenspan at Dr Larose Kells office

## 2014-07-04 NOTE — Telephone Encounter (Signed)
Magda Paganini please advise if Dr Melvyn Novas still has these FMLA forms or if they have been sent to [employer] to process.

## 2014-07-04 NOTE — Telephone Encounter (Signed)
Forms were sent to [employer]

## 2014-07-09 ENCOUNTER — Telehealth: Payer: Self-pay | Admitting: Internal Medicine

## 2014-07-09 DIAGNOSIS — J479 Bronchiectasis, uncomplicated: Secondary | ICD-10-CM

## 2014-07-09 NOTE — Telephone Encounter (Signed)
lmtcb

## 2014-07-10 NOTE — Telephone Encounter (Signed)
Per Crystal in [place] -- states they sent the patient a packet via mail 07/09/14 -- Authorization form to be filled out and signed by patient before forms could be filled out as well as pre-payment.  Per Crystal, once this is received they can proceed with working on the patients FMLA forms.   Called and spoke Patient--aware that she should be receiving this packet in the mail within the next few days. Pt states that she was referred out to [facility] and they are requesting she have a PFT and they are out of network. Pt is requesting to have a PFT done either at our office or a [facility] and results be faxed to [facility].   Please advise Dr Melvyn Novas. Thanks.

## 2014-07-10 NOTE — Telephone Encounter (Signed)
Patient returning call.  [phone]

## 2014-07-10 NOTE — Telephone Encounter (Signed)
Fine with me to schedule here or at [facility] asap

## 2014-07-14 ENCOUNTER — Ambulatory Visit (HOSPITAL_COMMUNITY)
Admission: RE | Admit: 2014-07-14 | Discharge: 2014-07-14 | Disposition: A | Discharge status: Home / Self Care | Payer: 59 | Source: Ambulatory Visit | Attending: Internal Medicine | Admitting: Internal Medicine

## 2014-07-14 DIAGNOSIS — J479 Bronchiectasis, uncomplicated: Secondary | ICD-10-CM | POA: Insufficient documentation

## 2014-07-14 MED ORDER — ALBUTEROL SULFATE (2.5 MG/3ML) 0.083% IN NEBU
2.5000 mg | INHALATION_SOLUTION | Freq: Once | RESPIRATORY_TRACT | Status: AC
  Administered 2014-07-14: 2.5 mg via RESPIRATORY_TRACT

## 2014-07-17 LAB — PULMONARY FUNCTION TEST
DL/VA % pred: 47 %
DL/VA: 2.41 ml/min/mmHg/L
DLCO UNC: 12.14 ml/min/mmHg
DLCO unc % pred: 45 %
FEF 25-75 Post: 1.57 L/sec
FEF 25-75 Pre: 1.23 L/sec
FEF2575-%Change-Post: 26 %
FEF2575-%Pred-Post: 59 %
FEF2575-%Pred-Pre: 46 %
FEV1-%CHANGE-POST: 11 %
FEV1-%PRED-PRE: 71 %
FEV1-%Pred-Post: 79 %
FEV1-Post: 2.27 L
FEV1-Pre: 2.04 L
FEV1FVC-%CHANGE-POST: 14 %
FEV1FVC-%Pred-Pre: 78 %
FEV6-%Change-Post: -3 %
FEV6-%PRED-POST: 89 %
FEV6-%Pred-Pre: 92 %
FEV6-Post: 3.18 L
FEV6-Pre: 3.28 L
FEV6FVC-%PRED-PRE: 103 %
FEV6FVC-%Pred-Post: 103 %
FVC-%Change-Post: -3 %
FVC-%Pred-Post: 87 %
FVC-%Pred-Pre: 89 %
FVC-POST: 3.18 L
FVC-Pre: 3.28 L
POST FEV6/FVC RATIO: 100 %
Post FEV1/FVC ratio: 71 %
Pre FEV1/FVC ratio: 62 %
Pre FEV6/FVC Ratio: 100 %
RV % PRED: 132 %
RV: 2.66 L
TLC % PRED: 110 %
TLC: 5.89 L

## 2014-07-25 ENCOUNTER — Telehealth: Payer: Self-pay | Admitting: Internal Medicine

## 2014-07-25 NOTE — Telephone Encounter (Signed)
Fine with me

## 2014-07-25 NOTE — Telephone Encounter (Signed)
FT done 07/1014. Please advise if okay to do so MW? thanks

## 2014-07-25 NOTE — Telephone Encounter (Signed)
Called and spoke with pt and she is aware that the PFT results have been faxed to [facility] per her request.  Nothing further is needed.

## 2014-08-01 ENCOUNTER — Ambulatory Visit (AMBULATORY_SURGERY_CENTER): Payer: Self-pay | Admitting: *Deleted

## 2014-08-01 VITALS — Ht 66.5 in | Wt 120.2 lb

## 2014-08-01 DIAGNOSIS — Z86010 Personal history of colonic polyps: Secondary | ICD-10-CM

## 2014-08-01 MED ORDER — MOVIPREP 100 G PO SOLR: 1.0000 | Freq: Once | ORAL | Status: DC

## 2014-08-01 NOTE — Progress Notes (Signed)
Denies allergies to eggs or soy products. Denies complications with sedation or anesthesia. Denies O2 use. Denies use of diet or weight loss medications.  Emmi instructions given for colonoscopy.  

## 2014-08-02 ENCOUNTER — Encounter: Payer: Self-pay | Admitting: Internal Medicine

## 2014-08-04 ENCOUNTER — Telehealth: Payer: Self-pay | Admitting: Internal Medicine

## 2014-08-04 NOTE — Telephone Encounter (Signed)
Called and spoke to pt. Informed pt of results of PFT and recs per MW. Pt verbalized understanding and denied any further questions or concerns at this time.    Notes Recorded by [name], MD on 08/02/2014 at 8:07 AM Call patient : Study is c/w bronchiectasis but no significant copd yet - key is to stop smoking now before it develops

## 2014-08-04 NOTE — Progress Notes (Signed)
Quick Note:  Notes Recorded by [name], MD on 08/02/2014 at 8:07 AM Call patient : Study is c/w bronchiectasis but no significant copd yet - key is to stop smoking now before it develops ______

## 2014-08-04 NOTE — Progress Notes (Signed)
Quick Note:  LMTCB ______ 

## 2014-08-05 ENCOUNTER — Telehealth: Payer: Self-pay | Admitting: Internal Medicine

## 2014-08-05 MED ORDER — HYDROCODONE-ACETAMINOPHEN 10-325 MG PO TABS: 1.0000 | ORAL_TABLET | Freq: Four times a day (QID) | ORAL | Status: DC | PRN

## 2014-08-05 NOTE — Telephone Encounter (Signed)
Pt is requesting refill for Hydrocodone.  Last OV: 06/09/2014 Last Fill: 03/10/2014 Last UDS: 04/04/2014 Low risk  Please Advise.

## 2014-08-05 NOTE — Telephone Encounter (Signed)
Done, 2 Rxs 

## 2014-08-05 NOTE — Telephone Encounter (Signed)
Caller name: Ame  Relation to pt: self  Call back number: [phone]   Reason for call:   pt requesting refill HYDROcodone-acetaminophen (NORCO) 10-325 MG per tablet will p/u in office please call when RX is ready. Pt also stated may send sister to pick up Dian Queen.please advise if this is okay.

## 2014-08-05 NOTE — Telephone Encounter (Signed)
Spoke with Hoyle Sauer, Pts sister, informed her that medication is ready for pick up at front desk.

## 2014-08-12 ENCOUNTER — Ambulatory Visit (INDEPENDENT_AMBULATORY_CARE_PROVIDER_SITE_OTHER): Payer: 59 | Admitting: Internal Medicine

## 2014-08-12 ENCOUNTER — Encounter: Payer: Self-pay | Admitting: Internal Medicine

## 2014-08-12 VITALS — BP 118/64 | HR 78 | Temp 98.5°F | Wt 119.0 lb

## 2014-08-12 DIAGNOSIS — Z23 Encounter for immunization: Secondary | ICD-10-CM

## 2014-08-12 DIAGNOSIS — J479 Bronchiectasis, uncomplicated: Secondary | ICD-10-CM

## 2014-08-12 NOTE — Assessment & Plan Note (Addendum)
Recently seen with hemoptysis 06-2014, chest x-ray showed significant changes compared to previous CXR, CT showed: New 5 x 5 cm slightly thick-walled cavity within the anterior right  upper lung containing 2 x 2.7 x 1.3 cm soft tissue density  suspicious for atypical infection (mycobacterium or fungal).  New peribronchial/interstitial thickening and ground-glass opacities  within the right upper lung also suspicious for atypical infection.  Unchanged right apical cavity, now with slightly thickened walls  which may be related to atypical infection as well.   Saw Dr. Melvyn Novas and then referred to Dr Olena Heckle at [facility], she ordered a   sputum for AFB which is sent to the lab today. Was prescribed Symbicort, unable to tolerate twice a day consequently just taking one puff daily. Status post Avelox for 10 days (july 22 and August 22).  She is currently nearly asymptomatic, some night sweats, no weight loss. Plan: Flu shot Sputum sample for AFB send per pulmonary request Will come back to this office as needed for a COPD/bronchiectasis exacerbation. otherwise follow up by pulmonary. Next routine visit 4-5 months

## 2014-08-12 NOTE — Progress Notes (Signed)
Subjective:    Patient ID: [name] [name], female    DOB: [birth year], 58 y.o.   MRN: [id number]  DOS:  08/12/2014 Type of visit - description : f/u Interval history: Since the last visit, she developed severe hemoptysis, went to the ER subsequently saw Dr. Melvyn Novas. She eventually was referred to Dr. Olena Heckle at [facility], she was seen last week. Here because she needs to give [place] a sputum sample.    ROS She reports two round of avelox x 10 days each  (started July 22 and August 22) Currently with no hemoptysis. No fevers but + night sweats. No weight loss No nausea, vomiting, diarrhea  Past Medical History  Diagnosis Date  . COPD (chronic obstructive pulmonary disease)     per CT 11/10/05, will minimal bronchiectasis rll  . GERD (gastroesophageal reflux disease)   . Osteoporosis     dexa per gyn  . 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 Headache(784.0)   . Lichen planus   . B12 deficiency   . Endometriosis   . Cataracts, bilateral   . Arthritis   . Asthma     .  years ago- has not had problem for years.  . Hemorrhoid   . Pneumothorax, spontaneous, tension     x 2 in her 20's.  has a chest tube for one  . 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 Knee surgery      .not replacement .  Marland Kitchen Ovarian cyst removed    . Video bronchoscopy  02/15/2012    Procedure: VIDEO BRONCHOSCOPY WITH FLUORO;  Surgeon: [name], MD;  Location: [facility];  Service: Endoscopy;  Laterality: Bilateral;  WASHING- INTERVENTION BIOPSIES INTERVENTION BRONCHOSCOPY WITH VIDEO  . Mediastinoscopy  05/25/2012    Procedure: MEDIASTINOSCOPY;  Surgeon: [name], MD;  Location: [facility] OR;  Service: Thoracic;  Laterality: N/A;  . Tonsillectomy    . Flexible bronchoscopy  11/05/2012    Procedure: FLEXIBLE BRONCHOSCOPY;  Surgeon: [name], MD;  Location: [place];  Service: Thoracic;   Laterality: N/A;  . Thoracotomy  11/05/2012    Procedure: THORACOTOMY MAJOR;  Surgeon: [name], MD;  Location: [place];  Service: Thoracic;  Laterality: Right;  . Lobectomy  11/05/2012    Procedure: LOBECTOMY;  Surgeon: [name], MD;  Location: [facility] OR;  Service: Thoracic;  Laterality: Right;  right upper lobectomy  . Esophagogastroduodenoscopy  12/03/2012    Procedure: ESOPHAGOGASTRODUODENOSCOPY (EGD);  Surgeon: [name], MD;  Location: [facility] ENDOSCOPY;  Service: Endoscopy;  Laterality: N/A;    History   Social History  . Marital Status: Single    Spouse Name: N/A    Number of Children: 0  . Years of Education: N/A   Occupational History  . 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 [place]   Social History Main Topics  . Smoking status: Current Every Day Smoker -- 0.40 packs/day for 38 years    Types: Cigarettes  . Smokeless tobacco: Never Used     Comment: "I don't inhale"  . Alcohol Use: 4.2 oz/week    7 Glasses of wine per week  . Drug Use: No  . Sexual Activity: Not on file   Other Topics Concern  . Not on file   Social History Narrative   sister lives  w/ pt    Lost mom 12-2013        Medication List       This list is accurate as of: 08/12/14  3:52 PM.  Always use your most recent med list.               acetaminophen 500 MG tablet  Commonly known as:  TYLENOL  Take 500 mg by mouth every 4 (four) hours as needed for moderate pain or fever. For fever     ALPRAZolam 0.5 MG tablet  Commonly known as:  XANAX  Take 0.5-1 tablets (0.25-0.5 mg total) by mouth at bedtime as needed. For insomnia.     Biotin 1000 MCG tablet  Take 1,000 mcg by mouth 3 (three) times daily.     calcium carbonate 750 MG chewable tablet  Commonly known as:  TUMS EX  Chew 1 tablet by mouth 2 (two) times daily.     HYDROcodone-acetaminophen 10-325 MG per tablet  Commonly known as:  NORCO  Take 1 tablet by mouth every 6 (six) hours as needed for moderate pain.      MOVIPREP 100 G Solr  Generic drug:  peg 3350 powder  Take 1 kit (200 g total) by mouth once.     NICORETTE 4 MG lozenge  Generic drug:  nicotine polacrilex  Place 4 mg inside cheek every 3 (three) hours as needed for smoking cessation. For nicotine cravings.     nicotine 10 MG inhaler  Commonly known as:  NICOTROL  Inhale 10 mg into the lungs as needed.     pantoprazole 40 MG tablet  Commonly known as:  PROTONIX  Take 40 mg by mouth 2 (two) times daily.     SYMBICORT 160-4.5 MCG/ACT inhaler  Generic drug:  budesonide-formoterol  Inhale 1 puff into the lungs daily.           Objective:   Physical Exam BP 118/64  Pulse 78  Temp(Src) 98.5 F (36.9 C) (Oral)  Wt 119 lb 0.8 oz (54 kg)  SpO2 97%  LMP 11/05/2004 General -- alert, well-developed, NAD.  Lungs -- normal respiratory effort, no intercostal retractions, no accessory muscle use, and slt decreased breath sounds w/ few ronchi, no wheezing.  Heart-- normal rate, regular rhythm, no murmur.  Psych-- Cognition and judgment appear intact. Cooperative with normal attention span and concentration. No anxious or depressed appearing.        Assessment & Plan:

## 2014-08-12 NOTE — Patient Instructions (Signed)
Take the sputum sample to the lab before you leave  Next visit, not fasting in 4-5 months Please make an appointment

## 2014-08-12 NOTE — Progress Notes (Signed)
Pre visit review using our clinic review tool, if applicable. No additional management support is needed unless otherwise documented below in the visit note. 

## 2014-08-18 ENCOUNTER — Encounter: Payer: Self-pay | Admitting: Gastroenterology

## 2014-08-24 ENCOUNTER — Encounter: Payer: Self-pay | Admitting: Internal Medicine

## 2014-09-04 ENCOUNTER — Ambulatory Visit (AMBULATORY_SURGERY_CENTER): Payer: Self-pay | Admitting: *Deleted

## 2014-09-04 VITALS — Ht 66.5 in | Wt 120.2 lb

## 2014-09-04 DIAGNOSIS — Z86010 Personal history of colonic polyps: Secondary | ICD-10-CM

## 2014-09-04 NOTE — Progress Notes (Signed)
No diet pills. ewm No home 02 use.ewm No egg or soy allergy. ewm No problems with past sedation. Just doesnt require much to sedate. ewm No emmi video per pt. ewm

## 2014-09-22 ENCOUNTER — Encounter: Payer: Self-pay | Admitting: Gastroenterology

## 2014-09-22 ENCOUNTER — Ambulatory Visit (AMBULATORY_SURGERY_CENTER): Payer: 59 | Admitting: Gastroenterology

## 2014-09-22 VITALS — BP 112/85 | HR 77 | Temp 98.1°F | Resp 15 | Ht 66.5 in | Wt 120.0 lb

## 2014-09-22 DIAGNOSIS — Z86010 Personal history of colonic polyps: Secondary | ICD-10-CM

## 2014-09-22 DIAGNOSIS — K635 Polyp of colon: Secondary | ICD-10-CM

## 2014-09-22 DIAGNOSIS — D125 Benign neoplasm of sigmoid colon: Secondary | ICD-10-CM

## 2014-09-22 MED ORDER — SODIUM CHLORIDE 0.9 % IV SOLN: 500.0000 mL | INTRAVENOUS | Status: DC

## 2014-09-22 NOTE — Patient Instructions (Signed)
YOU HAD AN ENDOSCOPIC PROCEDURE TODAY AT THE [facility]: Refer to the procedure report that was given to you for any specific questions about what was found during the examination.  If the procedure report does not answer your questions, please call your gastroenterologist to clarify.  If you requested that your care partner not be given the details of your procedure findings, then the procedure report has been included in a sealed envelope for you to review at your convenience later.  YOU SHOULD EXPECT: Some feelings of bloating in the abdomen. Passage of more gas than usual.  Walking can help get rid of the air that was put into your GI tract during the procedure and reduce the bloating. If you had a lower endoscopy (such as a colonoscopy or flexible sigmoidoscopy) you may notice spotting of blood in your stool or on the toilet paper. If you underwent a bowel prep for your procedure, then you may not have a normal bowel movement for a few days.  DIET: Your first meal following the procedure should be a light meal and then it is ok to progress to your normal diet.  A half-sandwich or bowl of soup is an example of a good first meal.  Heavy or fried foods are harder to digest and may make you feel nauseous or bloated.  Likewise meals heavy in dairy and vegetables can cause extra gas to form and this can also increase the bloating.  Drink plenty of fluids but you should avoid alcoholic beverages for 24 hours.  ACTIVITY: Your care partner should take you home directly after the procedure.  You should plan to take it easy, moving slowly for the rest of the day.  You can resume normal activity the day after the procedure however you should NOT DRIVE or use heavy machinery for 24 hours (because of the sedation medicines used during the test).    SYMPTOMS TO REPORT IMMEDIATELY: A gastroenterologist can be reached at any hour.  During normal business hours, 8:30 AM to 5:00 PM Monday through Friday,  call [phone].  After hours and on weekends, please call the GI answering service at [phone] who will take a message and have the physician on call contact you.   Following lower endoscopy (colonoscopy or flexible sigmoidoscopy):  Excessive amounts of blood in the stool  Significant tenderness or worsening of abdominal pains  Swelling of the abdomen that is new, acute  Fever of 100F or higher   FOLLOW UP: If any biopsies were taken you will be contacted by phone or by letter within the next 1-3 weeks.  Call your gastroenterologist if you have not heard about the biopsies in 3 weeks.  Our staff will call the home number listed on your records the next business day following your procedure to check on you and address any questions or concerns that you may have at that time regarding the information given to you following your procedure. This is a courtesy call and so if there is no answer at the home number and we have not heard from you through the emergency physician on call, we will assume that you have returned to your regular daily activities without incident.  SIGNATURES/CONFIDENTIALITY: You and/or your care partner have signed paperwork which will be entered into your electronic medical record.  These signatures attest to the fact that that the information above on your After Visit Summary has been reviewed and is understood.  Full responsibility of the confidentiality of   this discharge information lies with you and/or your care-partner.   Resume medications. Information given on polyps,diverticulosis and high fiber diet with discharge instructions. 

## 2014-09-22 NOTE — Op Note (Signed)
[place]  [address] [place], [address]   COLONOSCOPY PROCEDURE REPORT  PATIENT: [name] [name], [name] [name]  MR#: [id number] BIRTHDATE: Jul 08, 1956 , 81  yrs. old GENDER: female ENDOSCOPIST: Ladene Artist, MD, [facility] PROCEDURE DATE:  09/22/2014 PROCEDURE:   Colonoscopy with snare polypectomy First Screening Colonoscopy - Avg.  risk and is 50 yrs.  old or older - No.  Prior Negative Screening - Now for repeat screening. N/A  History of Adenoma - Now for follow-up colonoscopy & has been > or = to 3 yrs.  Yes hx of adenoma.  Has been 3 or more years since last colonoscopy.  Polyps Removed Today? Yes. ASA CLASS:   Class III INDICATIONS:surveillance colonoscopy based on a history of adenomatous colonic polyp(s). MEDICATIONS: Monitored anesthesia care and Propofol 400 mg IV DESCRIPTION OF PROCEDURE:   After the risks benefits and alternatives of the procedure were thoroughly explained, informed consent was obtained.  The digital rectal exam revealed no abnormalities of the rectum.   The LB [id number]  endoscope was introduced through the anus and advanced to the cecum, which was identified by both the appendix and ileocecal valve. No adverse events experienced with a tortuous colon.   The quality of the prep was good, using MoviPrep  The instrument was then slowly withdrawn as the colon was fully examined.    COLON FINDINGS: Four sessile polyps measuring 5 mm in size were found in the sigmoid colon.  A polypectomy was performed with a cold snare.  The resection was complete, the polyp tissue was completely retrieved and sent to histology.   There was diverticulosis noted in the sigmoid colon with associated colonic spasm and muscular hypertrophy. The examination was otherwise normal.  Retroflexed views revealed no abnormalities. The time to cecum=6 minutes 25 seconds.  Withdrawal time=13 minutes 20 seconds. The scope was withdrawn and the procedure  completed. COMPLICATIONS: There were no immediate complications.  ENDOSCOPIC IMPRESSION: 1.   Four sessile polyps in the sigmoid colon; polypectomy performed with a cold snare 2.   Moderate diverticulosis noted in the sigmoid colon 3.   The examination was otherwise normal  RECOMMENDATIONS: 1.  Await pathology results 2.  High fiber diet with liberal fluid intake. 3.  Repeat Colonoscopy in 5 years.  eSigned:  Ladene Artist, MD, [facility] 09/22/2014 4:24 PM

## 2014-09-22 NOTE — Progress Notes (Signed)
Stable to RR 

## 2014-09-22 NOTE — Progress Notes (Signed)
Called to room to assist during endoscopic procedure.  Patient ID and intended procedure confirmed with present staff. Received instructions for my participation in the procedure from the performing physician.  

## 2014-09-23 ENCOUNTER — Encounter: Payer: Self-pay | Admitting: Internal Medicine

## 2014-09-23 ENCOUNTER — Telehealth: Payer: Self-pay | Admitting: *Deleted

## 2014-09-23 NOTE — Telephone Encounter (Signed)
Pt's sister answered and said pt was at work, sister said she was going fine, advised to have pt call us if she has any questions-adm

## 2014-09-24 LAB — AFB CULTURE WITH SMEAR (NOT AT ARMC): Acid Fast Smear: NONE SEEN

## 2014-09-30 ENCOUNTER — Encounter: Payer: Self-pay | Admitting: Gastroenterology

## 2014-10-16 ENCOUNTER — Telehealth: Payer: Self-pay | Admitting: Internal Medicine

## 2014-10-16 MED ORDER — HYDROCODONE-ACETAMINOPHEN 10-325 MG PO TABS: 1.0000 | ORAL_TABLET | Freq: Four times a day (QID) | ORAL | Status: DC | PRN

## 2014-10-16 NOTE — Telephone Encounter (Signed)
Pt is requesting refill on Hydrocodone.   Last OV: 08/12/2014  Last Fill: 08/05/2014 # 59 0RF UDS: 04/04/2014 Low risk  Please advise.

## 2014-10-16 NOTE — Telephone Encounter (Signed)
Spoke with Pts sister Hoyle Sauer, informed her medication is ready for pick up at front desk.

## 2014-10-16 NOTE — Telephone Encounter (Signed)
Pt requesting a refill HYDROcodone-acetaminophen (NORCO) 10-325 MG per tablet

## 2014-10-16 NOTE — Telephone Encounter (Signed)
done

## 2014-10-28 ENCOUNTER — Other Ambulatory Visit: Payer: Self-pay | Admitting: Internal Medicine

## 2014-11-03 ENCOUNTER — Other Ambulatory Visit: Payer: Self-pay

## 2014-11-20 ENCOUNTER — Telehealth: Payer: Self-pay

## 2014-11-20 MED ORDER — HYDROCODONE-ACETAMINOPHEN 10-325 MG PO TABS: 1.0000 | ORAL_TABLET | Freq: Four times a day (QID) | ORAL | Status: DC | PRN

## 2014-11-20 MED ORDER — HYDROCODONE-ACETAMINOPHEN 10-325 MG PO TABS: ORAL_TABLET | ORAL | Status: DC

## 2014-11-20 NOTE — Telephone Encounter (Signed)
[name] [phone] okay to leave message  [name] [name] called and she would like a refill on her HYDROcodone-acetaminophen ([place]) 10-325 MG per tablet and she would like to pick up on Tuesday, Call when ready

## 2014-11-20 NOTE — Telephone Encounter (Signed)
Pt is requesting refill on Hydrocodone.  Last OV: 08/12/2014 Last Fill: 10/16/2014 # 81 0RF UDS: 04/23/2014 Low risk  Please advise.

## 2014-11-20 NOTE — Telephone Encounter (Signed)
Done, tell her we did 2 Rxs

## 2014-11-20 NOTE — Telephone Encounter (Signed)
Informed Pts sister, that medication is ready for pick up at front desk.

## 2014-12-10 ENCOUNTER — Encounter: Payer: Self-pay | Admitting: Internal Medicine

## 2014-12-10 ENCOUNTER — Ambulatory Visit (INDEPENDENT_AMBULATORY_CARE_PROVIDER_SITE_OTHER): Payer: 59 | Admitting: Internal Medicine

## 2014-12-10 VITALS — BP 117/83 | HR 97 | Temp 98.2°F | Ht 67.0 in | Wt 117.1 lb

## 2014-12-10 DIAGNOSIS — F419 Anxiety disorder, unspecified: Secondary | ICD-10-CM

## 2014-12-10 DIAGNOSIS — J479 Bronchiectasis, uncomplicated: Secondary | ICD-10-CM

## 2014-12-10 NOTE — Assessment & Plan Note (Addendum)
Saw Dr. Olena Heckle, inhalers changed, see new medication list. They requested a sputum culture today which is sent. Patient will call for FMLA paperwork if needed.

## 2014-12-10 NOTE — Progress Notes (Signed)
Pre visit review using our clinic review tool, if applicable. No additional management support is needed unless otherwise documented below in the visit note. 

## 2014-12-10 NOTE — Patient Instructions (Signed)
Please go to the lab and drop your sputum sample Next visit by April 2016, fasting for a physical exam

## 2014-12-10 NOTE — Progress Notes (Signed)
Subjective:    Patient ID: [name] [name], female    DOB: 15-Sep-1956, 59 y.o.   MRN: [id number]  DOS:  12/10/2014 Type of visit - description : rov Interval history: Feeling well, saw pulmonary Dr. Olena Heckle at [facility] last month, Rx a sputum culture which was sent and she requested to have another sputum culture done at this office this month. She had extensive counseling about tobacco but she still smokes   ROS Denies fever, chills or nocturnal sweats Still has occasional spasm at the right side of the chest mostly when she laughs or cough. Still has daily cough with greenish sputum production, going on for months, very seldom see streaks of blood   Past Medical History  Diagnosis Date  . COPD (chronic obstructive pulmonary disease)     per CT 11/10/05, will minimal bronchiectasis rll  . GERD (gastroesophageal reflux disease)   . Osteoporosis     dexa per gyn  . 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 Headache(784.0)   . Lichen planus   . B12 deficiency   . Endometriosis   . Cataracts, bilateral   . Arthritis   . Asthma     .  years ago- has not had problem for years.  . Hemorrhoid   . Pneumothorax, spontaneous, tension     x 2 in her 20's.  has a chest tube for one  . 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 Bronchiectasis     Past Surgical History  Procedure Laterality Date  . Knee surgery      .not replacement .x8  . Ovarian cyst removed    . Video bronchoscopy  02/15/2012    Procedure: VIDEO BRONCHOSCOPY WITH FLUORO;  Surgeon: [name], MD;  Location: [facility];  Service: Endoscopy;  Laterality: Bilateral;  WASHING- INTERVENTION BIOPSIES INTERVENTION BRONCHOSCOPY WITH VIDEO  . Mediastinoscopy  05/25/2012    Procedure: MEDIASTINOSCOPY;  Surgeon: [name], MD;  [facility] OR;  Service: Thoracic;  Laterality: N/A;  . Tonsillectomy    . Flexible bronchoscopy  11/05/2012    Procedure: FLEXIBLE  BRONCHOSCOPY;  Surgeon: [name], MD;  Location: [place];  Service: Thoracic;  Laterality: N/A;  . Thoracotomy  11/05/2012    Procedure: THORACOTOMY MAJOR;  Surgeon: [name], MD;  Location: [place];  Service: Thoracic;  Laterality: Right;  . Lobectomy  11/05/2012    Procedure: LOBECTOMY;  Surgeon: [name], MD;  Location: [facility] OR;  Service: Thoracic;  Laterality: Right;  right upper lobectomy  . Esophagogastroduodenoscopy  12/03/2012    Procedure: ESOPHAGOGASTRODUODENOSCOPY (EGD);  Surgeon: [name], MD;  Location: [facility] ENDOSCOPY;  Service: Endoscopy;  Laterality: N/A;    History   Social History  . Marital Status: Single    Spouse Name: N/A    Number of Children: 0  . Years of Education: N/A   Occupational History  . 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 [place]   Social History Main Topics  . Smoking status: Current Every Day Smoker -- 0.40 packs/day for 38 years    Types: Cigarettes  . Smokeless tobacco: Never Used     Comment: "I don't inhale"  . Alcohol Use: 4.2 oz/week    7 Glasses of wine per week  . Drug Use: No  . Sexual Activity: Not on file   Other Topics Concern  .  Not on file   Social History Narrative   sister lives w/ pt    Lost mom 12-2013        Medication List       This list is accurate as of: 12/10/14  7:39 PM.  Always use your most recent med list.               acetaminophen 500 MG tablet  Commonly known as:  TYLENOL  Take 500 mg by mouth every 4 (four) hours as needed for moderate pain or fever. For fever     ALPRAZolam 0.5 MG tablet  Commonly known as:  XANAX  Take 0.5-1 tablets (0.25-0.5 mg total) by mouth at bedtime as needed. For insomnia.     aspirin 81 MG chewable tablet  Chew 81 mg by mouth daily.     ATROVENT HFA 17 MCG/ACT inhaler  Generic drug:  ipratropium  Inhale 2 puffs into the lungs every 6 (six) hours as needed for wheezing.     beclomethasone 80 MCG/ACT inhaler  Commonly known as:  QVAR    Inhale 2 puffs into the lungs 2 (two) times daily.     Biotin 1000 MCG tablet  Take 1,000 mcg by mouth 3 (three) times daily.     HYDROcodone-acetaminophen 10-325 MG per tablet  Commonly known as:  NORCO  To be filled  12-2014     LYSINE PO  Take 1 tablet by mouth daily.     NICORETTE 4 MG lozenge  Generic drug:  nicotine polacrilex  Place 4 mg inside cheek every 3 (three) hours as needed for smoking cessation. For nicotine cravings.     nicotine 10 MG inhaler  Commonly known as:  NICOTROL  Inhale 10 mg into the lungs as needed.     pantoprazole 40 MG tablet  Commonly known as:  PROTONIX  TAKE 1 TABLET BY MOUTH TWICE DAILY BEFORE A MEAL     VITAMIN D PO  Take 1 tablet by mouth daily.           Objective:   Physical Exam BP 117/83 mmHg  Pulse 97  Temp(Src) 98.2 F (36.8 C) (Oral)  Ht 5\' 7"  (1.702 m)  Wt 117 lb 2 oz (53.128 kg)  BMI 18.34 kg/m2  SpO2 97%  LMP 11/05/2004 General -- alert, well-developed, NAD.  Neck --no mass or  LAD HEENT-- Not pale.   Lungs -- normal respiratory effort, no intercostal retractions, no accessory muscle use, and decreased breath sounds.  Heart-- normal rate, regular rhythm, no murmur.   Extremities-- no pretibial edema bilaterally  Neurologic--  alert & oriented X3. Speech normal, gait appropriate for age, strength symmetric and appropriate for age.  Psych-- Cognition and judgment appear intact. Cooperative with normal attention span and concentration. No anxious or depressed appearing.        Assessment & Plan:

## 2014-12-10 NOTE — Assessment & Plan Note (Signed)
Well controlled on Xanax, UDS 04/2014 low risk, refill when necessary

## 2014-12-16 ENCOUNTER — Ambulatory Visit (INDEPENDENT_AMBULATORY_CARE_PROVIDER_SITE_OTHER): Payer: 59 | Admitting: Internal Medicine

## 2014-12-16 ENCOUNTER — Encounter: Payer: Self-pay | Admitting: Internal Medicine

## 2014-12-16 VITALS — BP 108/64 | HR 81 | Temp 98.3°F | Wt 117.5 lb

## 2014-12-16 DIAGNOSIS — J479 Bronchiectasis, uncomplicated: Secondary | ICD-10-CM

## 2014-12-16 DIAGNOSIS — J470 Bronchiectasis with acute lower respiratory infection: Secondary | ICD-10-CM

## 2014-12-16 MED ORDER — MOXIFLOXACIN HCL 400 MG PO TABS: 400.0000 mg | ORAL_TABLET | Freq: Every day | ORAL | Status: DC

## 2014-12-16 NOTE — Assessment & Plan Note (Signed)
Bronchiectasis w/  exacerbation Will treat with Avelox, was recommended to send a sputum culture with exacerbations which will do. See instructions.

## 2014-12-16 NOTE — Progress Notes (Signed)
Subjective:    Patient ID: [name] [name], female    DOB: 28-Sep-1956, 59 y.o.   MRN: [id number]  DOS:  12/16/2014 Type of visit - description : acute Interval history: Symptoms started 5 days ago with postnasal dripping, runny nose, muscle aches. 3 days ago started with cough more than baseline, at times it has been very intense, sputum is now gold/green/brown. Again reports that has seen traces of blood sometimes. Overall feels better today but is not back to normal. She did bring a sample of her sputum  ROS No fevers, chills. Has been sweating more than usual at night Has normally right-sided chest pain with cough, that is more pronounced lately. Mild shortness of breath yesterday but not today. No lower extremity edema No nausea, vomiting, diarrhea No wheezing.  Past Medical History  Diagnosis Date  . COPD (chronic obstructive pulmonary disease)     per CT 11/10/05, will minimal bronchiectasis rll  . GERD (gastroesophageal reflux disease)   . Osteoporosis     dexa per gyn  . 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 Headache(784.0)   . Lichen planus   . B12 deficiency   . Endometriosis   . Cataracts, bilateral   . Arthritis   . Asthma     .  years ago- has not had problem for years.  . Hemorrhoid   . Pneumothorax, spontaneous, tension     x 2 in her 20's.  has a chest tube for one  . 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 Bronchiectasis     Past Surgical History  Procedure Laterality Date  . Knee surgery      .not replacement .x8  . Ovarian cyst removed    . Video bronchoscopy  02/15/2012    Procedure: VIDEO BRONCHOSCOPY WITH FLUORO;  Surgeon: [name], MD;  Location: [facility];  Service: Endoscopy;  Laterality: Bilateral;  WASHING- INTERVENTION BIOPSIES INTERVENTION BRONCHOSCOPY WITH VIDEO  . Mediastinoscopy  05/25/2012    Procedure: MEDIASTINOSCOPY;  Surgeon: [name], MD;  Location:  [facility] OR;  Service: Thoracic;  Laterality: N/A;  . Tonsillectomy    . Flexible bronchoscopy  11/05/2012    Procedure: FLEXIBLE BRONCHOSCOPY;  Surgeon: [name], MD;  Location: [place];  Service: Thoracic;  Laterality: N/A;  . Thoracotomy  11/05/2012    Procedure: THORACOTOMY MAJOR;  Surgeon: [name], MD;  Location: [place];  Service: Thoracic;  Laterality: Right;  . Lobectomy  11/05/2012    Procedure: LOBECTOMY;  Surgeon: [name], MD;  Location: [facility] OR;  Service: Thoracic;  Laterality: Right;  right upper lobectomy  . Esophagogastroduodenoscopy  12/03/2012    Procedure: ESOPHAGOGASTRODUODENOSCOPY (EGD);  Surgeon: [name], MD;  Location: [facility] ENDOSCOPY;  Service: Endoscopy;  Laterality: N/A;    History   Social History  . Marital Status: Single    Spouse Name: N/A    Number of Children: 0  . Years of Education: N/A   Occupational History  . 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 [place]   Social History Main Topics  . Smoking status: Current Every Day Smoker -- 0.40 packs/day for 38 years    Types: Cigarettes  . Smokeless tobacco: Never Used     Comment: "I don't inhale"  . Alcohol Use: 4.2 oz/week    7 Glasses of wine per week  . Drug  Use: No  . Sexual Activity: Not on file   Other Topics Concern  . Not on file   Social History Narrative   sister lives w/ pt    Lost mom 12-2013        Medication List       This list is accurate as of: 12/16/14  5:59 PM.  Always use your most recent med list.               acetaminophen 500 MG tablet  Commonly known as:  TYLENOL  Take 500 mg by mouth every 4 (four) hours as needed for moderate pain or fever. For fever     ALPRAZolam 0.5 MG tablet  Commonly known as:  XANAX  Take 0.5-1 tablets (0.25-0.5 mg total) by mouth at bedtime as needed. For insomnia.     aspirin 81 MG chewable tablet  Chew 81 mg by mouth daily.     ATROVENT HFA 17 MCG/ACT inhaler  Generic drug:  ipratropium  Inhale 2 puffs into  the lungs every 6 (six) hours as needed for wheezing.     beclomethasone 80 MCG/ACT inhaler  Commonly known as:  QVAR  Inhale 2 puffs into the lungs 2 (two) times daily.     Biotin 1000 MCG tablet  Take 1,000 mcg by mouth 3 (three) times daily.     HYDROcodone-acetaminophen 10-325 MG per tablet  Commonly known as:  NORCO  To be filled  12-2014     LYSINE PO  Take 1 tablet by mouth daily.     moxifloxacin 400 MG tablet  Commonly known as:  AVELOX  Take 1 tablet (400 mg total) by mouth daily.     NICORETTE 4 MG lozenge  Generic drug:  nicotine polacrilex  Place 4 mg inside cheek every 3 (three) hours as needed for smoking cessation. For nicotine cravings.     nicotine 10 MG inhaler  Commonly known as:  NICOTROL  Inhale 10 mg into the lungs as needed.     pantoprazole 40 MG tablet  Commonly known as:  PROTONIX  TAKE 1 TABLET BY MOUTH TWICE DAILY BEFORE A MEAL     VITAMIN D PO  Take 1 tablet by mouth daily.           Objective:   Physical Exam BP 108/64 mmHg  Pulse 81  Temp(Src) 98.3 F (36.8 C) (Oral)  Wt 117 lb 8 oz (53.298 kg)  SpO2 92%  LMP 11/05/2004 General -- alert, well-developed, NAD.  HEENT-- Not pale.  R Ear-- normal L ear-- normal Throat symmetric, no redness or discharge. Face symmetric, sinuses not tender to palpation. Nose slt  congested.  Lungs -- normal respiratory effort, no intercostal retractions, no accessory muscle use, and decreased breath sounds.  Heart-- normal rate, regular rhythm, no murmur.  Extremities-- no pretibial edema bilaterally  Neurologic--  alert & oriented X3. Speech normal, gait appropriate for age, strength symmetric and appropriate for age.  Psych-- Cognition and judgment appear intact. Cooperative with normal attention span and concentration. No anxious or depressed appearing.      Assessment & Plan:

## 2014-12-16 NOTE — Patient Instructions (Signed)
Rest, fluids , tylenol Take Mucinex DM twice a day as needed until better If the cough continue, use hydrocodone which is very good cough suppressant. For sure take hydrocodone at night for the next few days to stop the cough.  Use OTC Nasocort or Flonase : 2 nasal sprays on each side of the nose daily until you feel better   Take the antibiotic as prescribed  (avelox )  Call if not gradually better over the next  10 days  Call anytime if the symptoms are severe , you have high fever, increased chest pain, increased difficulty breathing or you cough up more  blood.

## 2014-12-16 NOTE — Progress Notes (Signed)
Pre visit review using our clinic review tool, if applicable. No additional management support is needed unless otherwise documented below in the visit note. 

## 2014-12-29 ENCOUNTER — Telehealth: Payer: Self-pay | Admitting: Internal Medicine

## 2014-12-29 NOTE — Telephone Encounter (Signed)
Faxed to [place] pharmacy.  

## 2014-12-29 NOTE — Telephone Encounter (Signed)
Pt is requesting refill on Alprazolam. Dr. Larose Kells Pt.   Last OV:12/16/14 Last Fill: 05/07/2014 # [id number] UDS: 04/04/2014 Low risk  Please advise.

## 2014-12-29 NOTE — Telephone Encounter (Signed)
Printed and signed by Dr. Etter Sjogren in Dr. Larose Kells absence.

## 2014-12-29 NOTE — Telephone Encounter (Signed)
Refill x1 

## 2014-12-31 ENCOUNTER — Telehealth: Payer: Self-pay | Admitting: Internal Medicine

## 2014-12-31 NOTE — Telephone Encounter (Signed)
Caller name:[facility] Relationship to patient: Can be [phone] Pharmacy:  Reason for call:Need directions on refill for hydrocodone, not thing was wrote on rx

## 2014-12-31 NOTE — Telephone Encounter (Signed)
Spoke with Aaron Edelman at [facility], informed I can amend directions on Rx but was informed Pt left with Rx. Tried calling Pt to inform her if she takes Rx back to pharmacy to let us know that we can call to amend directions or we can print new Rx for her to pick up with directions per Dr. Larose Kells as "1 tablet q6h PRN # 90 tablets and 0 refills; must get original Rx if new Rx is printed and void and shred.

## 2014-12-31 NOTE — Telephone Encounter (Signed)
Per Dr. Larose Kells, Pt is to take Hydrocodone 1 tablet q6h PRN # 90 and 0 refills.

## 2014-12-31 NOTE — Telephone Encounter (Signed)
Please advise 

## 2015-01-01 NOTE — Telephone Encounter (Signed)
Pt states she will take the rx back this mrng and give it to [place].

## 2015-01-01 NOTE — Telephone Encounter (Signed)
Aaron Edelman informed of  MD directions. Pharmacist voiced understand.

## 2015-01-02 ENCOUNTER — Ambulatory Visit (INDEPENDENT_AMBULATORY_CARE_PROVIDER_SITE_OTHER): Payer: 59 | Admitting: Family Medicine

## 2015-01-02 ENCOUNTER — Encounter: Payer: Self-pay | Admitting: Family Medicine

## 2015-01-02 VITALS — BP 128/86 | HR 92 | Ht 67.0 in | Wt 116.0 lb

## 2015-01-02 DIAGNOSIS — S4991XA Unspecified injury of right shoulder and upper arm, initial encounter: Secondary | ICD-10-CM

## 2015-01-02 MED ORDER — METHOCARBAMOL 500 MG PO TABS: 500.0000 mg | ORAL_TABLET | Freq: Three times a day (TID) | ORAL | Status: AC | PRN

## 2015-01-02 NOTE — Patient Instructions (Signed)
You subluxed your shoulder. We will get prior records from your surgeon and review these. Wear sling for next 2 weeks then start physical therapy and home exercises. Icing as needed 15 minutes at a time 3-4 times a day. Robaxin as needed for spasms. Continue your pain medication. Follow up with me in 6 weeks. Consider MR arthrogram if not improving as expected.

## 2015-01-06 DIAGNOSIS — S4991XA Unspecified injury of right shoulder and upper arm, initial encounter: Secondary | ICD-10-CM | POA: Insufficient documentation

## 2015-01-06 NOTE — Progress Notes (Signed)
PCP: Kathlene November, MD  Subjective:   HPI: Patient is a 59 y.o. female here for right shoulder injury.  Patient reports first injury to right shoulder occurred 10/16/2013. She was walking to her car in a concrete parking deck when she tripped and fell - sustained an avulsion fracture of her hip and an injury to her right shoulder. Was in the hospital for this - there was debate however on what she did to her shoulder - initially told she had dislocated it but orthopedics disagreed with this. She reports when they were doing her x-rays of her shoulder it felt like the shoulder 'popped back in' She did 4 months of PT and 1 month of home exercises but never felt like the shoulder got back to 100%. Never had an MRI of this shoulder but was given 5% disability for it. Has had some clicking and feelings of shoulder sticking. Then on 1/23 she jerked her arm forward when washing dishes and it felt like shoulder came out of socket. Made her nauseous and dizzy. Has been icing. Prior orthopedist would not see her - no records currently available. Limited use of this arm since accident 1/23.   Past Medical History  Diagnosis Date  . COPD (chronic obstructive pulmonary disease)     per CT 11/10/05, will minimal bronchiectasis rll  . GERD (gastroesophageal reflux disease)   . Osteoporosis     dexa per gyn  . 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 Headache(784.0)   . Lichen planus   . B12 deficiency   . Endometriosis   . Cataracts, bilateral   . Arthritis   . Asthma     .  years ago- has not had problem for years.  . Hemorrhoid   . Pneumothorax, spontaneous, tension     x 2 in her 20's.  has a chest tube for one  . 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 Bronchiectasis     Current Outpatient Prescriptions on File Prior to Visit  Medication Sig Dispense Refill  . acetaminophen (TYLENOL) 500 MG tablet Take 500 mg by mouth  every 4 (four) hours as needed for moderate pain or fever. For fever    . ALPRAZolam (XANAX) 0.5 MG tablet TAKE 1/2 TO 1 TABLET BY MOUTH AT BEDTIME AS NEEDED FOR INSOMNIA 90 tablet 0  . aspirin 81 MG chewable tablet Chew 81 mg by mouth daily.    . beclomethasone (QVAR) 80 MCG/ACT inhaler Inhale 2 puffs into the lungs 2 (two) times daily.    . Biotin 1000 MCG tablet Take 1,000 mcg by mouth 3 (three) times daily.    . Cholecalciferol (VITAMIN D PO) Take 1 tablet by mouth daily.    Marland Kitchen HYDROcodone-acetaminophen (NORCO) 10-325 MG per tablet To be filled  12-2014 90 tablet 0  . ipratropium (ATROVENT HFA) 17 MCG/ACT inhaler Inhale 2 puffs into the lungs every 6 (six) hours as needed for wheezing.    Marland Kitchen LYSINE PO Take 1 tablet by mouth daily.    Marland Kitchen moxifloxacin (AVELOX) 400 MG tablet Take 1 tablet (400 mg total) by mouth daily. 7 tablet 0  . nicotine (NICOTROL) 10 MG inhaler Inhale 10 mg into the lungs as needed.    . nicotine polacrilex (NICORETTE) 4 MG lozenge Place 4 mg inside cheek every 3 (three) hours as needed for smoking cessation. For nicotine cravings.    . pantoprazole (PROTONIX) 40 MG tablet  TAKE 1 TABLET BY MOUTH TWICE DAILY BEFORE A MEAL 60 tablet 4   No current facility-administered medications on file prior to visit.    Past Surgical History  Procedure Laterality Date  . Knee surgery      .not replacement .x8  . Ovarian cyst removed    . Video bronchoscopy  02/15/2012    Procedure: VIDEO BRONCHOSCOPY WITH FLUORO;  Surgeon: [name], MD;  Location: [facility];  Service: Endoscopy;  Laterality: Bilateral;  WASHING- INTERVENTION BIOPSIES INTERVENTION BRONCHOSCOPY WITH VIDEO  . Mediastinoscopy  05/25/2012    Procedure: MEDIASTINOSCOPY;  Surgeon: [name], MD;  Location: [facility] OR;  Service: Thoracic;  Laterality: N/A;  . Tonsillectomy    . Flexible bronchoscopy  11/05/2012    Procedure: FLEXIBLE BRONCHOSCOPY;  Surgeon: [name], MD;  Location: [place];  Service: Thoracic;   Laterality: N/A;  . Thoracotomy  11/05/2012    Procedure: THORACOTOMY MAJOR;  Surgeon: [name], MD;  Location: [place];  Service: Thoracic;  Laterality: Right;  . Lobectomy  11/05/2012    Procedure: LOBECTOMY;  Surgeon: [name], MD;  Location: [facility] OR;  Service: Thoracic;  Laterality: Right;  right upper lobectomy  . Esophagogastroduodenoscopy  12/03/2012    Procedure: ESOPHAGOGASTRODUODENOSCOPY (EGD);  Surgeon: [name], MD;  Location: [facility] ENDOSCOPY;  Service: Endoscopy;  Laterality: N/A;    Allergies  Allergen Reactions  . Alendronate Sodium Other (See Comments)    REACTION: chest pain  . Antihistamines, Chlorpheniramine-Type Other (See Comments)    REACTION: "makes lungs bleed"  . Benadryl [Diphenhydramine] Other (See Comments)    REACTION: "makes lungs bleed"  . Nsaids Other (See Comments)    REACTION: ischemic colitis  . Other Other (See Comments)    All anti-inflammatories.  REACTION: ischemic colitis  . Pseudoephedrine Other (See Comments)    REACTION: Ischemic colitis  . Robitussin A-C [Guaifenesin-Codeine] Itching and Other (See Comments)    Palms itching  . Levofloxacin Anxiety    History   Social History  . Marital Status: Single    Spouse Name: N/A    Number of Children: 0  . Years of Education: N/A   Occupational History  . 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 [place]   Social History Main Topics  . Smoking status: Current Every Day Smoker -- 0.40 packs/day for 38 years    Types: Cigarettes  . Smokeless tobacco: Never Used     Comment: "I don't inhale"  . Alcohol Use: 4.2 oz/week    7 Glasses of wine per week  . Drug Use: No  . Sexual Activity: Not on file   Other Topics Concern  . Not on file   Social History Narrative   sister lives w/ pt    Lost mom 12-2013    Family History  Problem Relation Age of Onset  . Colon cancer Neg Hx   . Breast cancer Neg Hx   . Esophageal cancer Neg Hx   . Rectal cancer Neg Hx   . Stomach  cancer Neg Hx   . Diabetes Mother     GM  . Heart disease Mother     in her 13s  . Heart disease Maternal Grandfather   . Cancer Father     sarcoma  . Diabetes Other     multiple    BP 128/86 mmHg  Pulse 92  Ht 5\' 7"  (1.702 m)  Wt 116 lb (52.617  kg)  BMI 18.16 kg/m2  LMP 11/05/2004  Review of Systems: See HPI above.    Objective:  Physical Exam:  Gen: NAD  Right shoulder: No swelling, ecchymoses.  No gross deformity. Diffuse tenderness. FROM with painful arc. Negative Hawkins, Neers. Negative Speeds, Yergasons. Strength 5/5 with empty can and resisted internal/external rotation.  Pain with empty can. Mildly positive apprehension. Positive sulcus. Obriens equivocal - pain with internal and external rotation. NV intact distally.    Assessment & Plan:  1. Right shoulder injury - consistent with recurrent shoulder subluxation and instability based on history and exam.  She never did improve from her initial work injury from 10/16/2013 - believe the initial subluxation occurred then.  I'm surprised she never had an MRI arthrogram if she never improved to assess for labral tear which is common with this type of injury.  We will obtain outside records to review.  In meantime sling for 2 weeks then physical therapy and home exercises.  Icing with robaxin for spasms.  F/u in 6 weeks.

## 2015-01-06 NOTE — Assessment & Plan Note (Signed)
consistent with recurrent shoulder subluxation and instability based on history and exam.  She never did improve from her initial work injury from 10/16/2013 - believe the initial subluxation occurred then.  I'm surprised she never had an MRI arthrogram if she never improved to assess for labral tear which is common with this type of injury.  We will obtain outside records to review.  In meantime sling for 2 weeks then physical therapy and home exercises.  Icing with robaxin for spasms.  F/u in 6 weeks.

## 2015-01-07 ENCOUNTER — Telehealth: Payer: Self-pay | Admitting: Family Medicine

## 2015-01-07 NOTE — Telephone Encounter (Signed)
Spoke with patient and told her that PT will be calling her either today or tomorrow.

## 2015-01-07 NOTE — Addendum Note (Signed)
Addended by: Sherrie George F on: 01/07/2015 01:02 PM   Modules accepted: Orders

## 2015-01-22 ENCOUNTER — Ambulatory Visit: Payer: 59 | Admitting: Physical Therapy

## 2015-01-22 LAB — AFB CULTURE WITH SMEAR (NOT AT ARMC): ACID FAST SMEAR: NONE SEEN

## 2015-01-26 ENCOUNTER — Ambulatory Visit: Payer: 59 | Attending: Family Medicine | Admitting: Physical Therapy

## 2015-01-26 DIAGNOSIS — M25511 Pain in right shoulder: Secondary | ICD-10-CM

## 2015-01-26 DIAGNOSIS — R29898 Other symptoms and signs involving the musculoskeletal system: Secondary | ICD-10-CM | POA: Diagnosis not present

## 2015-01-26 NOTE — Therapy (Signed)
[place], [place], [address] Phone: [phone]   Fax:  [phone]  Physical Therapy Evaluation  Patient Details  Name: [name] [name] MRN: [id number] Date of Birth: 01-09-1956 Referring Provider:  [name], MD  Encounter Date: 01/26/2015      PT End of Session - 01/26/15 1722    Visit Number 1   Number of Visits 16   Date for PT Re-Evaluation 03/27/15   PT Start Time 1630   PT Stop Time 1710   PT Time Calculation (min) 40 min   Activity Tolerance Patient tolerated treatment well   Behavior During Therapy [facility] for tasks assessed/performed      Past Medical History  Diagnosis Date  . COPD (chronic obstructive pulmonary disease)     per CT 11/10/05, will minimal bronchiectasis rll  . GERD (gastroesophageal reflux disease)   . Osteoporosis     dexa per gyn  . 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 Headache(784.0)   . Lichen planus   . B12 deficiency   . Endometriosis   . Cataracts, bilateral   . Arthritis   . Asthma     .  years ago- has not had problem for years.  . Hemorrhoid   . Pneumothorax, spontaneous, tension     x 2 in her 20's.  has a chest tube for one  . 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 Bronchiectasis     Past Surgical History  Procedure Laterality Date  . Knee surgery      .not replacement .x8  . Ovarian cyst removed    . Video bronchoscopy  02/15/2012    Procedure: VIDEO BRONCHOSCOPY WITH FLUORO;  Surgeon: [name], MD;  Location: [facility];  Service: Endoscopy;  Laterality: Bilateral;  WASHING- INTERVENTION BIOPSIES INTERVENTION BRONCHOSCOPY WITH VIDEO  . Mediastinoscopy  05/25/2012    Procedure: MEDIASTINOSCOPY;  Surgeon: [name], MD;  Location: [facility] OR;  Service: Thoracic;  Laterality: N/A;  . Tonsillectomy    . Flexible bronchoscopy  11/05/2012    Procedure: FLEXIBLE  BRONCHOSCOPY;  Surgeon: [name], MD;  Location: [place];  Service: Thoracic;  Laterality: N/A;  . Thoracotomy  11/05/2012    Procedure: THORACOTOMY MAJOR;  Surgeon: [name], MD;  Location: [place];  Service: Thoracic;  Laterality: Right;  . Lobectomy  11/05/2012    Procedure: LOBECTOMY;  Surgeon: [name], MD;  Location: [facility] OR;  Service: Thoracic;  Laterality: Right;  right upper lobectomy  . Esophagogastroduodenoscopy  12/03/2012    Procedure: ESOPHAGOGASTRODUODENOSCOPY (EGD);  Surgeon: [name], MD;  Location: [facility] ENDOSCOPY;  Service: Endoscopy;  Laterality: N/A;    LMP 11/05/2004  Visit Diagnosis:  Right shoulder pain - Plan: PT plan of care cert/re-cert  Weakness of right upper extremity - Plan: PT plan of care cert/re-cert      Subjective Assessment - 01/26/15 1635    Symptoms Pt is a 59 y/o female who presents to OPPT for R shoulder subluxation.  Pt reports initial injury of falling in parking lot 10/16/2013 with R shoulder injury.  Pt with recent subluxation 12/27/14 while washing dishes (unsure if related to initial injury at this time).  Pt presents today with pain and decreased UE use.   Pertinent History Bronchiectasis, COPD, osteoporosis   Limitations House hold activities;Lifting   Diagnostic tests n/a   Patient Stated Goals  be able to return to regular movements   Currently in Pain? Yes   Pain Score 6   none currently   Pain Location Shoulder   Pain Orientation Right   Pain Descriptors / Indicators Aching;Sharp   Pain Onset More than a month ago   Pain Frequency Intermittent   Aggravating Factors  movement   Pain Relieving Factors support (sling; decreasing pull)          [facility] PT Assessment - 01/26/15 1642    Assessment   Medical Diagnosis R shoulder subluxation   Onset Date 12/27/14   Next MD Visit 02/13/15   Prior Therapy after initial injury HH and OP x 3-4 months total   Precautions   Precautions None   Precaution Comments limit external  rotation and forward reaching with external rotation   Restrictions   Weight Bearing Restrictions No   Balance Screen   Has the patient fallen in the past 6 months No   Has the patient had a decrease in activity level because of a fear of falling?  No   Is the patient reluctant to leave their home because of a fear of falling?  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; sitting at computer throughout day   Leisure enjoys painting/upholstery   Cognition   Overall Cognitive Status Within Functional Limits for tasks assessed   Observation/Other Assessments   Focus on Therapeutic Outcomes (FOTO)  50 (50% limited; predicted 33% limited)   Posture/Postural Control   Posture/Postural Control Postural limitations   Postural Limitations Rounded Shoulders;Forward head;Increased thoracic kyphosis   AROM   AROM Assessment Site Shoulder   Right/Left Shoulder Right;Left   Right Shoulder Flexion 145 Degrees   Right Shoulder ABduction 147 Degrees   Right Shoulder Internal Rotation 87 Degrees  FIR WNL with pain   Right Shoulder External Rotation 80 Degrees  FER WNL; pain with measurement   Left Shoulder Flexion 150 Degrees   Left Shoulder ABduction 150 Degrees   Strength   Strength Assessment Site Shoulder;Elbow;Hand   Right/Left Shoulder Right;Left   Right Shoulder Flexion 3+/5  with pain   Right Shoulder Extension 3/5  with pain   Right Shoulder ABduction 4/5   Right Shoulder Internal Rotation 4/5   Right Shoulder External Rotation 3/5  with  pain   Left Shoulder Flexion 4+/5   Left Shoulder Extension 4+/5   Left Shoulder ABduction 4+/5   Left Shoulder Internal Rotation 5/5   Left Shoulder External Rotation 4+/5   Right/Left Elbow Right;Left   Right Elbow Flexion 4/5   Right Elbow Extension 3+/5  with pain   Left Elbow Flexion 5/5   Left Elbow Extension 5/5   Right/Left hand Right;Left   Grip (lbs) 36  42, 38, 28 (with pain)   Grip (lbs) 37  43, 35, 33   Palpation   Palpation tenderness R proximal biceps tendon; and R RTC muscles  PT Education - 01/26/15 1721    Education provided Yes   Education Details POC/goals of care   Person(s) Educated Patient   Methods Explanation   Comprehension Verbalized understanding          PT Short Term Goals - 01/26/15 1725    PT SHORT TERM GOAL #1   Title independent with HEP (02/23/15)   Time 4   Period Weeks   Status New   PT SHORT TERM GOAL #2   Title perform R shoulder ROM without end range pain (02/22/14)   Time 4   Period Weeks   Status New   PT SHORT TERM GOAL #3   Title report 30% improvement in ADLs without increase in pain (02/22/14)   Time 4   Period Weeks   Status New           PT Long Term Goals - 01/26/15 1726    PT LONG TERM GOAL #1   Title independent with advanced HEP (03/23/15)   Time 8   Period Weeks   Status New   PT LONG TERM GOAL #2   Title verbalize understanding of posture/body mechanics to reduce risk of reinjury (03/23/15)   Time 8   Period Weeks   Status New   PT LONG TERM GOAL #3   Title improve R shoulder strength to at least 4/5 all motions for improved strength and function (03/23/15)   Time 8   Period Weeks   Status New   PT LONG TERM GOAL #4   Title report not increase in pain with ADLs and no need for compensatory strategies (03/23/15)   Time 8   Period Weeks   Status New               Plan - 01/26/15 1722    Clinical Impression Statement Pt presents to OPPT with RUE  weakness and pain limiting ADLs and function.  Will benefit from PT to maximize function and decrease pain.  ROM unlimited however pain at end range with most R shoulder movements.    Pt will benefit from skilled therapeutic intervention in order to improve on the following deficits Improper body mechanics;Postural dysfunction;Pain;Decreased strength;Impaired UE functional use   Rehab Potential Good   PT Frequency 2x / week   PT Duration 8 weeks   PT Treatment/Interventions ADLs/Self Care Home Management;Cryotherapy;Electrical Stimulation;Functional mobility training;Neuromuscular re-education;Ultrasound;Manual techniques;Passive range of motion;Therapeutic exercise;Moist Heat;Therapeutic activities;Patient/family education   PT Next Visit Plan review pt's prior HEP (pt to bring copies); posture and update HEP as needed; ionto if order signed   Consulted and Agree with Plan of Care Patient         Problem List Patient Active Problem List   Diagnosis Date Noted  . Right shoulder injury 01/06/2015  . Prediabetes 04/02/2014  . Avulsion fracture of trochanter of femur 10/17/2013  . Shoulder dislocation 10/17/2013  . Anxiety 10/17/2013  . Nausea alone 03/05/2013  . Other dysphagia 12/03/2012  . Odynophagia 11/28/2012  . 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 S/P lobectomy of lung 11/18/2012  . Weight loss 12/13/2011  . Annual physical exam 06/23/2011  . INSOMNIA-SLEEP DISORDER-UNSPEC 11/22/2010  . PALPITATIONS 02/27/2009  . CIGARETTE SMOKER 12/24/2007  . BRONCHIECTASIS 12/24/2007  . ISCHEMIC COLITIS 12/24/2007  . HEPATIC CYST 12/24/2007  . HEADACHES, HX OF 12/24/2007  . B12 DEFICIENCY 12/30/2006  . ASTHMA 12/30/2006  . COPD GOLD 0  12/30/2006  . GERD 12/30/2006  . ENDOMETRIOSIS NOS 12/30/2006  . LICHEN PLANUS 15/17/6160  .  OSTEOARTHRITIS 12/30/2006  . OSTEOPOROSIS 12/30/2006   Laureen Abrahams, PT, DPT 01/26/2015 5:32 PM  [place]  [facility] [address] [place], [place], [address] Phone: [phone]   Fax:  [phone]

## 2015-01-28 ENCOUNTER — Ambulatory Visit: Payer: 59 | Admitting: Rehabilitation

## 2015-01-28 DIAGNOSIS — R29898 Other symptoms and signs involving the musculoskeletal system: Secondary | ICD-10-CM

## 2015-01-28 DIAGNOSIS — M25511 Pain in right shoulder: Secondary | ICD-10-CM

## 2015-01-28 NOTE — Patient Instructions (Addendum)
Strengthening: Isometric Flexion  Using wall for resistance, press right fist into ball using light pressure. Hold ____ seconds. Repeat ____ times per set. Do ____ sets per session. Do ____ sessions per day.  SHOULDER: Abduction (Isometric)  Use wall as resistance. Press arm against pillow.( elbow straight or bent) Hold _5__ seconds. __10_ reps per set, __1-2_ sets per day  Extension (Isometric)  Place left bent elbow and back of arm against wall. Press elbow against wall. Hold __5__ seconds. Repeat __10__ times. Do __1-2__ sessions per day.  Internal Rotation (Isometric)  Place palm of right fist against door frame, with elbow bent. Press fist against door frame. Hold __5__ seconds. Repeat __10__ times. Do __1-2__ sessions per day.  External Rotation (Isometric)  Place back of left fist against door frame, with elbow bent. Press fist against door frame. Hold __5__ seconds. Repeat ___10_ times. Do __1-2__ sessions per day.  Copyright  VHI. All rights reserved.   SHOULDER: External Rotation - Supine (Cane)   Hold cane with both hands. Rotate arm away from body. Keep elbow on floor and next to body. _10__ reps per set, __1-2_ sets per day Copyright  VHI. All rights reserved.  Cane Horizontal - Supine   With straight arms holding cane above shoulders, bring cane out to right, center, out to left, and back to above head. Repeat _10__ times. Do _2__ times per day.  Copyright  VHI. All rights reserved.  Cane Exercise: Flexion   Lie on back, press cane up above chest x10. Keeping arms as straight as possible, lower cane toward floor beyond head. Hold __3__ seconds. Repeat __10__ times. Do _2___ sessions per day.  http://gt2.exer.us/91   Copyright  VHI. All rights reserved.  IONTOPHORESIS PATIENT PRECAUTIONS & CONTRAINDICATIONS:  . Redness under one or both electrodes can occur.  This characterized by a uniform redness that usually disappears within 12 hours of  treatment. . Small pinhead size blisters may result in response to the drug.  Contact your physician if the problem persists more than 24 hours. . On rare occasions, iontophoresis therapy can result in temporary skin reactions such as rash, inflammation, irritation or burns.  The skin reactions may be the result of individual sensitivity to the ionic solution used, the condition of the skin at the start of treatment, reaction to the materials in the electrodes, allergies or sensitivity to dexamethasone, or a poor connection between the patch and your skin.  Discontinue using iontophoresis if you have any of these reactions and report to your therapist. . Remove the Patch or electrodes if you have any undue sensation of pain or burning during the treatment and report discomfort to your therapist. . Tell your Therapist if you have had known adverse reactions to the application of electrical current. . If using the Patch, the LED light will turn off when treatment is complete and the patch can be removed.  Approximate treatment time is 1-3 hours.  Remove the patch when light goes off or after 6 hours. . The Patch can be worn during normal activity, however excessive motion where the electrodes have been placed can cause poor contact between the skin and the electrode or uneven electrical current resulting in greater risk of skin irritation. Marland Kitchen Keep out of the reach of children.   . DO NOT use if you have a cardiac pacemaker or any other electrically sensitive implanted device. . DO NOT use if you have a known sensitivity to dexamethasone. . DO NOT use during Magnetic  Resonance Imaging (MRI). . DO NOT use over broken or compromised skin (e.g. sunburn, cuts, or acne) due to the increased risk of skin reaction. . DO NOT SHAVE over the area to be treated:  To establish good contact between the Patch and the skin, excessive hair may be clipped. . DO NOT place the Patch or electrodes on or over your eyes,  directly over your heart, or brain. . DO NOT reuse the Patch or electrodes as this may cause burns to occur.

## 2015-01-29 NOTE — Therapy (Signed)
[place], [place], [address] Phone: [phone]   Fax:  [phone]  Physical Therapy Treatment  Patient Details  Name: [name] [name] MRN: [id number] Date of Birth: 1955-12-14 Referring Provider:  Colon Branch, MD  Encounter Date: 01/28/2015      PT End of Session - 01/28/15 0825    Visit Number 2   Number of Visits 16   Date for PT Re-Evaluation 03/27/15   PT Start Time 0805   PT Stop Time 0845   PT Time Calculation (min) 40 min      Past Medical History  Diagnosis Date  . COPD (chronic obstructive pulmonary disease)     per CT 11/10/05, will minimal bronchiectasis rll  . GERD (gastroesophageal reflux disease)   . Osteoporosis     dexa per gyn  . 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 Headache(784.0)   . Lichen planus   . B12 deficiency   . Endometriosis   . Cataracts, bilateral   . Arthritis   . Asthma     .  years ago- has not had problem for years.  . Hemorrhoid   . Pneumothorax, spontaneous, tension     x 2 in her 20's.  has a chest tube for one  . 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 Bronchiectasis     Past Surgical History  Procedure Laterality Date  . Knee surgery      .not replacement .x8  . Ovarian cyst removed    . Video bronchoscopy  02/15/2012    Procedure: VIDEO BRONCHOSCOPY WITH FLUORO;  Surgeon: [name], MD;  Location: [facility];  Service: Endoscopy;  Laterality: Bilateral;  WASHING- INTERVENTION BIOPSIES INTERVENTION BRONCHOSCOPY WITH VIDEO  . Mediastinoscopy  05/25/2012    Procedure: MEDIASTINOSCOPY;  Surgeon: [name], MD;  Location: [facility] OR;  Service: Thoracic;  Laterality: N/A;  . Tonsillectomy    . Flexible bronchoscopy  11/05/2012    Procedure: FLEXIBLE BRONCHOSCOPY;  Surgeon: [name], MD;  Location: [place];  Service: Thoracic;  Laterality: N/A;  . Thoracotomy  11/05/2012   Procedure: THORACOTOMY MAJOR;  Surgeon: [name], MD;  Location: [place];  Service: Thoracic;  Laterality: Right;  . Lobectomy  11/05/2012    Procedure: LOBECTOMY;  Surgeon: [name], MD;  Location: [facility] OR;  Service: Thoracic;  Laterality: Right;  right upper lobectomy  . Esophagogastroduodenoscopy  12/03/2012    Procedure: ESOPHAGOGASTRODUODENOSCOPY (EGD);  Surgeon: [name], MD;  Location: [facility] ENDOSCOPY;  Service: Endoscopy;  Laterality: N/A;    LMP 11/05/2004  Visit Diagnosis:  Right shoulder pain  Weakness of right upper extremity      Subjective Assessment - 01/28/15 0807    Symptoms No pain now. 5/10 with driving. Then I stop using it.    Currently in Pain? No/denies   Pain Descriptors / Indicators Aching   Pain Frequency Intermittent                    [facility] Adult PT Treatment/Exercise - 01/29/15 0001    Shoulder Exercises: Supine   Other Supine Exercises Supine cane x 10 each   Shoulder Exercises: Pulleys   Flexion 2 minutes   ABduction 1 minute   Shoulder Exercises: Isometric Strengthening   Flexion 5X10"   Extension 5X10"   External Rotation 5X10"   Internal Rotation 5X10"  ABduction 5X10"   ABduction Limitations cues for gentle press and to not increase pain    Modalities   Modalities Cryotherapy;Iontophoresis   Cryotherapy   Number Minutes Cryotherapy 10 Minutes   Cryotherapy Location Shoulder   Type of Cryotherapy Ice pack   Iontophoresis   Type of Iontophoresis Dexamethasone   Location Right anterior shouler   Dose 1.0 cc dex   Time 6 hours Stat patch                PT Education - 01/28/15 0841    Education provided Yes   Education Details Supine cane, isometrics shoulder   Person(s) Educated Patient   Methods Explanation;Handout   Comprehension Verbalized understanding          PT Short Term Goals - 01/26/15 1725    PT SHORT TERM GOAL #1   Title independent with HEP (02/23/15)   Time 4   Period Weeks    Status New   PT SHORT TERM GOAL #2   Title perform R shoulder ROM without end range pain (02/22/14)   Time 4   Period Weeks   Status New   PT SHORT TERM GOAL #3   Title report 30% improvement in ADLs without increase in pain (02/22/14)   Time 4   Period Weeks   Status New           PT Long Term Goals - 01/26/15 1726    PT LONG TERM GOAL #1   Title independent with advanced HEP (03/23/15)   Time 8   Period Weeks   Status New   PT LONG TERM GOAL #2   Title verbalize understanding of posture/body mechanics to reduce risk of reinjury (03/23/15)   Time 8   Period Weeks   Status New   PT LONG TERM GOAL #3   Title improve R shoulder strength to at least 4/5 all motions for improved strength and function (03/23/15)   Time 8   Period Weeks   Status New   PT LONG TERM GOAL #4   Title report not increase in pain with ADLs and no need for compensatory strategies (03/23/15)   Time 8   Period Weeks   Status New               Plan - 01/28/15 2440    Clinical Impression Statement mild increase pain with therex today. Reduced after ice to shoulder. Able to initiate HEP for shoulder strength.   PT Next Visit Plan Review HEP, continue Ionto        Problem List Patient Active Problem List   Diagnosis Date Noted  . Right shoulder injury 01/06/2015  . Prediabetes 04/02/2014  . Avulsion fracture of trochanter of femur 10/17/2013  . Shoulder dislocation 10/17/2013  . Anxiety 10/17/2013  . Nausea alone 03/05/2013  . Other dysphagia 12/03/2012  . Odynophagia 11/28/2012  . 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 S/P lobectomy of lung 11/18/2012  . Weight loss 12/13/2011  . Annual physical exam 06/23/2011  . INSOMNIA-SLEEP DISORDER-UNSPEC 11/22/2010  . PALPITATIONS 02/27/2009  . CIGARETTE SMOKER 12/24/2007  . BRONCHIECTASIS 12/24/2007  . ISCHEMIC COLITIS 12/24/2007  . HEPATIC CYST 12/24/2007  . HEADACHES, HX OF 12/24/2007  . B12 DEFICIENCY 12/30/2006  . ASTHMA  12/30/2006  . COPD GOLD 0  12/30/2006  . GERD 12/30/2006  . ENDOMETRIOSIS NOS 12/30/2006  . LICHEN PLANUS 10/01/2535  . OSTEOARTHRITIS 12/30/2006  . OSTEOPOROSIS 12/30/2006    Dorene Ar, PTA 01/29/2015, 7:38 AM  [place], [place], [address] Phone: [phone]   Fax:  [phone]

## 2015-02-04 ENCOUNTER — Ambulatory Visit: Payer: 59 | Attending: Family Medicine | Admitting: Rehabilitation

## 2015-02-04 ENCOUNTER — Telehealth: Payer: Self-pay | Admitting: Internal Medicine

## 2015-02-04 DIAGNOSIS — M25511 Pain in right shoulder: Secondary | ICD-10-CM | POA: Diagnosis present

## 2015-02-04 DIAGNOSIS — R29898 Other symptoms and signs involving the musculoskeletal system: Secondary | ICD-10-CM | POA: Insufficient documentation

## 2015-02-04 MED ORDER — HYDROCODONE-ACETAMINOPHEN 10-325 MG PO TABS: 1.0000 | ORAL_TABLET | Freq: Four times a day (QID) | ORAL | Status: DC | PRN

## 2015-02-04 NOTE — Therapy (Signed)
[place], [place], [address] Phone: [phone]   Fax:  [phone]  Physical Therapy Treatment  Patient Details  Name: [name] [name] MRN: [id number] Date of Birth: 10-Mar-1956 Referring Provider:  Colon Branch, MD  Encounter Date: 02/04/2015      PT End of Session - 02/04/15 0932    Visit Number 3   Number of Visits 16   Date for PT Re-Evaluation 03/27/15   PT Start Time 0845   PT Stop Time 0930   PT Time Calculation (min) 45 min      Past Medical History  Diagnosis Date  . COPD (chronic obstructive pulmonary disease)     per CT 11/10/05, will minimal bronchiectasis rll  . GERD (gastroesophageal reflux disease)   . Osteoporosis     dexa per gyn  . 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 Headache(784.0)   . Lichen planus   . B12 deficiency   . Endometriosis   . Cataracts, bilateral   . Arthritis   . Asthma     .  years ago- has not had problem for years.  . Hemorrhoid   . Pneumothorax, spontaneous, tension     x 2 in her 20's.  has a chest tube for one  . 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 Bronchiectasis     Past Surgical History  Procedure Laterality Date  . Knee surgery      .not replacement .x8  . Ovarian cyst removed    . Video bronchoscopy  02/15/2012    Procedure: VIDEO BRONCHOSCOPY WITH FLUORO;  Surgeon: [name], MD;  Location: [facility];  Service: Endoscopy;  Laterality: Bilateral;  WASHING- INTERVENTION BIOPSIES INTERVENTION BRONCHOSCOPY WITH VIDEO  . Mediastinoscopy  05/25/2012    Procedure: MEDIASTINOSCOPY;  Surgeon: [name], MD;  Location: [facility] OR;  Service: Thoracic;  Laterality: N/A;  . Tonsillectomy    . Flexible bronchoscopy  11/05/2012    Procedure: FLEXIBLE BRONCHOSCOPY;  Surgeon: [name], MD;  Location: [place];  Service: Thoracic;  Laterality: N/A;  . Thoracotomy  11/05/2012   Procedure: THORACOTOMY MAJOR;  Surgeon: [name], MD;  Location: [place];  Service: Thoracic;  Laterality: Right;  . Lobectomy  11/05/2012    Procedure: LOBECTOMY;  Surgeon: [name], MD;  Location: [facility] OR;  Service: Thoracic;  Laterality: Right;  right upper lobectomy  . Esophagogastroduodenoscopy  12/03/2012    Procedure: ESOPHAGOGASTRODUODENOSCOPY (EGD);  Surgeon: [name], MD;  Location: [facility] ENDOSCOPY;  Service: Endoscopy;  Laterality: N/A;    LMP 11/05/2004  Visit Diagnosis:  Right shoulder pain  Weakness of right upper extremity      Subjective Assessment - 02/04/15 0852    Symptoms 2/10 pain now. A little less pain with driving and doing my hair.    Aggravating Factors  sudden movements   Pain Relieving Factors support                    [facility] Adult PT Treatment/Exercise - 02/04/15 0909    Shoulder Exercises: Supine   Horizontal ABduction Strengthening;Both;10 reps;Theraband   Theraband Level (Shoulder Horizontal ABduction) --  beige band sitting and supine.    External Rotation Strengthening;Both;10 reps;Theraband   Theraband Level (Shoulder External Rotation) Other (comment)  Beige theraband also in sitting   Flexion AROM;Right;20 reps   Flexion Limitations bent elbow x10,  straight x 10   Other Supine Exercises Supine cane x 20 each   Shoulder Exercises: Seated   Row Strengthening;Both   Theraband Level (Shoulder Row) Other (comment)  beige   Shoulder Exercises: Pulleys   Flexion 2 minutes   Shoulder Exercises: ROM/Strengthening   Prot/Ret//Elev/Dep x 10    Other ROM/Strengthening Exercises right wall slide with LUE assist at elbow x 10   Shoulder Exercises: Isometric Strengthening   Flexion 5X10"   Extension 5X10"   External Rotation 5X10"   Internal Rotation 5X10"                PT Education - 02/04/15 0929    Education provided Yes   Education Details Theraband ER, horiz abdct beige band   Person(s) Educated Patient    Methods Explanation;Handout   Comprehension Verbalized understanding          PT Short Term Goals - 01/26/15 1725    PT SHORT TERM GOAL #1   Title independent with HEP (02/23/15)   Time 4   Period Weeks   Status New   PT SHORT TERM GOAL #2   Title perform R shoulder ROM without end range pain (02/22/14)   Time 4   Period Weeks   Status New   PT SHORT TERM GOAL #3   Title report 30% improvement in ADLs without increase in pain (02/22/14)   Time 4   Period Weeks   Status New           PT Long Term Goals - 01/26/15 1726    PT LONG TERM GOAL #1   Title independent with advanced HEP (03/23/15)   Time 8   Period Weeks   Status New   PT LONG TERM GOAL #2   Title verbalize understanding of posture/body mechanics to reduce risk of reinjury (03/23/15)   Time 8   Period Weeks   Status New   PT LONG TERM GOAL #3   Title improve R shoulder strength to at least 4/5 all motions for improved strength and function (03/23/15)   Time 8   Period Weeks   Status New   PT LONG TERM GOAL #4   Title report not increase in pain with ADLs and no need for compensatory strategies (03/23/15)   Time 8   Period Weeks   Status New               Plan - 02/04/15 0932    Clinical Impression Statement Pt demonstrates less pain with exercises today and reports decreased pain with functinal activities. She may have to cancel over next 2 weeks due to court case.   PT Next Visit Plan Review HEP, continue Ionto, may try tape after ionto        Problem List Patient Active Problem List   Diagnosis Date Noted  . Right shoulder injury 01/06/2015  . Prediabetes 04/02/2014  . Avulsion fracture of trochanter of femur 10/17/2013  . Shoulder dislocation 10/17/2013  . Anxiety 10/17/2013  . Nausea alone 03/05/2013  . Other dysphagia 12/03/2012  . Odynophagia 11/28/2012  . 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 S/P lobectomy of lung 11/18/2012  . Weight loss 12/13/2011  . Annual physical  exam 06/23/2011  . INSOMNIA-SLEEP DISORDER-UNSPEC 11/22/2010  . PALPITATIONS 02/27/2009  . CIGARETTE SMOKER 12/24/2007  . BRONCHIECTASIS 12/24/2007  . ISCHEMIC COLITIS 12/24/2007  . HEPATIC CYST 12/24/2007  . HEADACHES, HX OF 12/24/2007  . B12 DEFICIENCY 12/30/2006  . ASTHMA 12/30/2006  . COPD GOLD 0  12/30/2006  . GERD 12/30/2006  . ENDOMETRIOSIS NOS 12/30/2006  . LICHEN PLANUS 15/94/7076  . OSTEOARTHRITIS 12/30/2006  . OSTEOPOROSIS 12/30/2006    Dorene Ar, PTA 02/04/2015, 9:34 AM  [facility] [address] [place], [place], [address] Phone: [phone]   Fax:  [phone]

## 2015-02-04 NOTE — Telephone Encounter (Signed)
Pt is requesting refill on Hydrocodone.  Last OV: 12/16/2014 Last Fill: 11/20/2014 # [id number] 0RF UDS: 04/04/2014 Low risk  Please advise.

## 2015-02-04 NOTE — Telephone Encounter (Signed)
Print #90, no RF

## 2015-02-04 NOTE — Telephone Encounter (Signed)
LMOM informing Pt that Rx is ready for pick up at front desk.

## 2015-02-04 NOTE — Telephone Encounter (Signed)
Caller name: Faria Relation to pt: self Call back number: [phone] Pharmacy:  Reason for call:   Requesting hydrocodone refill

## 2015-02-04 NOTE — Telephone Encounter (Signed)
Rx printed, awaiting signature by Dr. Paz.  

## 2015-02-04 NOTE — Patient Instructions (Signed)
External Rotation (Eccentric), (Resistance Band)   Quickly pull band outward with forearm of affected arm. Keep elbows at sides, wrists neutral. Slowly return to start for 3-5 seconds. Use __beige______ resistance band. Hint: Use towel roll between elbow and hip if wanted __10-20_ reps per set, _1-2__ sets per day, _7__ days per week.  Copyright  VHI. All rights reserved.  Resisted Horizontal Abduction: Bilateral   Sit or stand, tubing in both hands, arms out in front. Keeping arms straight, pinch shoulder blades together and stretch arms out. Repeat 10____ times per set. Do ___1-2_ sets per session. Do __2__ sessions per day.  http://orth.exer.us/969   Copyright  VHI. All rights reserved.

## 2015-02-06 ENCOUNTER — Encounter: Payer: Self-pay | Admitting: Physical Therapy

## 2015-02-09 ENCOUNTER — Ambulatory Visit: Payer: 59 | Admitting: Rehabilitation

## 2015-02-09 DIAGNOSIS — M25511 Pain in right shoulder: Secondary | ICD-10-CM

## 2015-02-09 DIAGNOSIS — R29898 Other symptoms and signs involving the musculoskeletal system: Secondary | ICD-10-CM

## 2015-02-09 NOTE — Patient Instructions (Signed)
Strengthening: Resisted Internal Rotation   Hold tubing in left hand, elbow at side and forearm out. Rotate forearm in across body. Repeat _10___ times per set. Do _2___ sets per session. Do __2__ sessions per day.  http://orth.exer.us/830   Copyright  VHI. All rights reserved.  Strengthening: Resisted External Rotation   Hold tubing in right hand, elbow at side and forearm across body. Rotate forearm out. Repeat __10__ times per set. Do _2___ sets per session. Do __2__ sessions per day.  http://orth.exer.us/828   Copyright  VHI. All rights reserved.  Strengthening: Resisted Flexion   BENT ELBOW  (PUNCH) -place band around you and punch with 2 hands Hold tubing with left arm at side. Pull forward and up. Move shoulder through pain-free range of motion. Repeat _10___ times per set. Do _2___ sets per session. Do __2__ sessions per day.  http://orth.exer.us/824   Copyright  VHI. All rights reserved.  Strengthening: Resisted Extension   Hold tubing in right hand, arm forward. Pull arm back, elbow bent or staight Repeat _10___ times per set. Do __2__ sets per session. Do __2__ sessions per day.  http://orth.exer.us/832   Copyright  VHI. All rights reserved.

## 2015-02-09 NOTE — Therapy (Signed)
[place], [place], [address] Phone: [phone]   Fax:  [phone]  Physical Therapy Treatment  Patient Details  Name: [name] [name] MRN: [id number] Date of Birth: 02-Nov-1956 Referring Provider:  Colon Branch, MD  Encounter Date: 02/09/2015      PT End of Session - 02/09/15 1554    Visit Number 4   Number of Visits 16   Date for PT Re-Evaluation 03/27/15   PT Start Time 0348   PT Stop Time 0430   PT Time Calculation (min) 42 min      Past Medical History  Diagnosis Date  . COPD (chronic obstructive pulmonary disease)     per CT 11/10/05, will minimal bronchiectasis rll  . GERD (gastroesophageal reflux disease)   . Osteoporosis     dexa per gyn  . 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 Headache(784.0)   . Lichen planus   . B12 deficiency   . Endometriosis   . Cataracts, bilateral   . Arthritis   . Asthma     .  years ago- has not had problem for years.  . Hemorrhoid   . Pneumothorax, spontaneous, tension     x 2 in her 20's.  has a chest tube for one  . 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 Bronchiectasis     Past Surgical History  Procedure Laterality Date  . Knee surgery      .not replacement .x8  . Ovarian cyst removed    . Video bronchoscopy  02/15/2012    Procedure: VIDEO BRONCHOSCOPY WITH FLUORO;  Surgeon: [name], MD;  Location: [facility];  Service: Endoscopy;  Laterality: Bilateral;  WASHING- INTERVENTION BIOPSIES INTERVENTION BRONCHOSCOPY WITH VIDEO  . Mediastinoscopy  05/25/2012    Procedure: MEDIASTINOSCOPY;  Surgeon: [name], MD;  Location: [facility] OR;  Service: Thoracic;  Laterality: N/A;  . Tonsillectomy    . Flexible bronchoscopy  11/05/2012    Procedure: FLEXIBLE BRONCHOSCOPY;  Surgeon: [name], MD;  Location: [place];  Service: Thoracic;  Laterality: N/A;  . Thoracotomy  11/05/2012   Procedure: THORACOTOMY MAJOR;  Surgeon: [name], MD;  Location: [place];  Service: Thoracic;  Laterality: Right;  . Lobectomy  11/05/2012    Procedure: LOBECTOMY;  Surgeon: [name], MD;  Location: [facility] OR;  Service: Thoracic;  Laterality: Right;  right upper lobectomy  . Esophagogastroduodenoscopy  12/03/2012    Procedure: ESOPHAGOGASTRODUODENOSCOPY (EGD);  Surgeon: [name], MD;  Location: [facility] ENDOSCOPY;  Service: Endoscopy;  Laterality: N/A;    LMP 11/05/2004  Visit Diagnosis:  Right shoulder pain  Weakness of right upper extremity      Subjective Assessment - 02/09/15 1550    Symptoms Posterior shoulder hurts today. My AROM was better in shower this morning.    Currently in Pain? Yes   Pain Score 2    Pain Location Shoulder   Pain Orientation Right   Pain Descriptors / Indicators Aching   Aggravating Factors  washing dishes, too long driving using right arm   Pain Relieving Factors sleep          [facility] PT Assessment - 02/09/15 1556    AROM   Right Shoulder Flexion 140 Degrees   Right Shoulder ABduction 150 Degrees   Right Shoulder Internal Rotation --  reach to T 8   Right Shoulder External Rotation --  reach to T -2   Strength   Right Shoulder Flexion 4-/5  pain   Right Shoulder Extension 4/5  pain   Right Shoulder ABduction 4+/5   Right Shoulder Internal Rotation 4/5   Right Shoulder External Rotation 4/5                  [facility] Adult PT Treatment/Exercise - 02/09/15 0001    Shoulder Exercises: Supine   Horizontal ABduction Strengthening;Both;10 reps;Theraband   Theraband Level (Shoulder Horizontal ABduction) Level 2 (Red)   Horizontal ABduction Limitations supine    External Rotation --   Theraband Level (Shoulder External Rotation) --   Flexion Right;10 reps;Weights   Theraband Level (Shoulder Flexion) --   Shoulder Flexion Weight (lbs) 1# x 10 bent elbow   Other Supine Exercises Supine press up and pullover 2# 10 each   Shoulder  Exercises: Seated   Row Right;15 reps;Theraband   Theraband Level (Shoulder Row) Level 2 (Red)   Shoulder Exercises: Sidelying   ABduction 20 reps   Shoulder Exercises: Standing   Other Standing Exercises [place] Red band x 10-15 all planes- pt prefers to modify the exercises so that she does not have to place knot in the door.                 PT Education - 02/09/15 1625    Education provided Yes   Education Details [place] Red band   Person(s) Educated Patient   Methods Explanation;Handout   Comprehension Verbalized understanding          PT Short Term Goals - 02/09/15 1615    PT SHORT TERM GOAL #1   Title independent with HEP (02/23/15)   Time 4   Period Weeks   Status Achieved   PT SHORT TERM GOAL #2   Title perform R shoulder ROM without end range pain (02/22/14)   Time 4   Period Weeks   Status On-going  pain with abduction AROM   PT SHORT TERM GOAL #3   Title report 30% improvement in ADLs without increase in pain (02/22/14)   Time 4   Period Weeks   Status Achieved           PT Long Term Goals - 02/09/15 1637    PT LONG TERM GOAL #1   Title independent with advanced HEP (03/23/15)   Time 8   Period Weeks   Status On-going   PT LONG TERM GOAL #2   Title verbalize understanding of posture/body mechanics to reduce risk of reinjury (03/23/15)   Time 8   Period Weeks   Status Achieved   PT LONG TERM GOAL #3   Title improve R shoulder strength to at least 4/5 all motions for improved strength and function (03/23/15)   Time 8   Period Weeks   Status Partially Met  shoulder flexion 4-/5   PT LONG TERM GOAL #4   Title report not increase in pain with ADLs and no need for compensatory strategies (03/23/15)   Time 8   Period Weeks   Status On-going               Plan - 02/09/15 1636    Clinical Impression Statement Strength improved. Able to tolerate red band strengthening without increased pain. Added to HEP. Painfree AROM except  abduction.   PT Next Visit Plan Review HEP, continue Ionto, may try tape after ionto        Problem List Patient Active Problem List   Diagnosis  Date Noted  . Right shoulder injury 01/06/2015  . Prediabetes 04/02/2014  . Avulsion fracture of trochanter of femur 10/17/2013  . Shoulder dislocation 10/17/2013  . Anxiety 10/17/2013  . Nausea alone 03/05/2013  . Other dysphagia 12/03/2012  . Odynophagia 11/28/2012  . 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 S/P lobectomy of lung 11/18/2012  . Weight loss 12/13/2011  . Annual physical exam 06/23/2011  . INSOMNIA-SLEEP DISORDER-UNSPEC 11/22/2010  . PALPITATIONS 02/27/2009  . CIGARETTE SMOKER 12/24/2007  . BRONCHIECTASIS 12/24/2007  . ISCHEMIC COLITIS 12/24/2007  . HEPATIC CYST 12/24/2007  . HEADACHES, HX OF 12/24/2007  . B12 DEFICIENCY 12/30/2006  . ASTHMA 12/30/2006  . COPD GOLD 0  12/30/2006  . GERD 12/30/2006  . ENDOMETRIOSIS NOS 12/30/2006  . LICHEN PLANUS 09/24/1172  . OSTEOARTHRITIS 12/30/2006  . OSTEOPOROSIS 12/30/2006    Dorene Ar, PTA 02/09/2015, 5:15 PM  [place], [place], [address] Phone: [phone]   Fax:  [phone]

## 2015-02-11 ENCOUNTER — Ambulatory Visit: Payer: 59 | Admitting: Rehabilitation

## 2015-02-11 DIAGNOSIS — M25511 Pain in right shoulder: Secondary | ICD-10-CM

## 2015-02-11 DIAGNOSIS — R29898 Other symptoms and signs involving the musculoskeletal system: Secondary | ICD-10-CM

## 2015-02-11 NOTE — Therapy (Signed)
[place], [place], [address] Phone: [phone]   Fax:  [phone]  Physical Therapy Treatment  Patient Details  Name: [name] [name] MRN: [id number] Date of Birth: [birth year] Referring Provider:  Colon Branch, MD  Encounter Date: 02/11/2015      PT End of Session - 02/11/15 1621    Visit Number 5   Number of Visits 16   Date for PT Re-Evaluation 03/27/15   PT Start Time 0348      Past Medical History  Diagnosis Date  . COPD (chronic obstructive pulmonary disease)     per CT 11/10/05, will minimal bronchiectasis rll  . GERD (gastroesophageal reflux disease)   . Osteoporosis     dexa per gyn  . 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 Headache(784.0)   . Lichen planus   . B12 deficiency   . Endometriosis   . Cataracts, bilateral   . Arthritis   . Asthma     .  years ago- has not had problem for years.  . Hemorrhoid   . Pneumothorax, spontaneous, tension     x 2 in her 20's.  has a chest tube for one  . 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 Bronchiectasis     Past Surgical History  Procedure Laterality Date  . Knee surgery      .not replacement .x8  . Ovarian cyst removed    . Video bronchoscopy  02/15/2012    Procedure: VIDEO BRONCHOSCOPY WITH FLUORO;  Surgeon: [name], MD;  [facility]: [facility];  Service: Endoscopy;  Laterality: Bilateral;  WASHING- INTERVENTION BIOPSIES INTERVENTION BRONCHOSCOPY WITH VIDEO  . Mediastinoscopy  05/25/2012    Procedure: MEDIASTINOSCOPY;  Surgeon: [name], MD;  Location: [facility] OR;  Service: Thoracic;  Laterality: N/A;  . Tonsillectomy    . Flexible bronchoscopy  11/05/2012    Procedure: FLEXIBLE BRONCHOSCOPY;  Surgeon: [name], MD;  Location: [place];  Service: Thoracic;  Laterality: N/A;  . Thoracotomy  11/05/2012    Procedure: THORACOTOMY MAJOR;  Surgeon: [name],  MD;  Location: [place];  Service: Thoracic;  Laterality: Right;  . Lobectomy  11/05/2012    Procedure: LOBECTOMY;  Surgeon: [name], MD;  Location: [facility] OR;  Service: Thoracic;  Laterality: Right;  right upper lobectomy  . Esophagogastroduodenoscopy  12/03/2012    Procedure: ESOPHAGOGASTRODUODENOSCOPY (EGD);  Surgeon: [name], MD;  Location: [facility] ENDOSCOPY;  Service: Endoscopy;  Laterality: N/A;    LMP 11/05/2004  Visit Diagnosis:  Right shoulder pain  Weakness of right upper extremity      Subjective Assessment - 02/11/15 1551    Symptoms I had to hand write 70 Bingo cards for two hours this morning, no increased pain. The patch really helps. I had a sharp pain with tossing shirt into hamper (external rotation)   Currently in Pain? No/denies          [facility] PT Assessment - 02/11/15 1708    AROM   Right Shoulder Flexion 140 Degrees   Right Shoulder ABduction 150 Degrees   Right Shoulder Internal Rotation 87 Degrees  reach to T 8   Right Shoulder External Rotation 80 Degrees  reach to T -2   Strength   Right Shoulder Flexion 4-/5  pain   Right Shoulder Extension 4/5  pain   Right Shoulder ABduction 4+/5   Right  Shoulder Internal Rotation 4/5   Right Shoulder External Rotation 4/5                  [facility] Adult PT Treatment/Exercise - 02/11/15 1556    Shoulder Exercises: Seated   Row Both;20 reps;Theraband   Theraband Level (Shoulder Row) Level 2 (Red)   Horizontal ABduction 15 reps;Theraband   Theraband Level (Shoulder Horizontal ABduction) Other (comment)  beige   Flexion Right;10 reps;Theraband   Flexion Limitations seated in chair like an overhead press   Shoulder Exercises: Standing   ABduction Both;10 reps   Other Standing Exercises [place] Red band x 10-15 all planes- pt prefers to modify the exercises so that she does not have to place knot in the door.    Shoulder Exercises: ROM/Strengthening   UBE (Upper Arm Bike) 2.5 min for, 2.5 min back  -harder cues for posture  increased pain with backwards   Iontophoresis   Type of Iontophoresis Dexamethasone   Location Right posterior shouler   Dose 1.0 cc dex   Time 6 hours Stat patch                  PT Short Term Goals - 02/09/15 1615    PT SHORT TERM GOAL #1   Title independent with HEP (02/23/15)   Time 4   Period Weeks   Status Achieved   PT SHORT TERM GOAL #2   Title perform R shoulder ROM without end range pain (02/22/14)   Time 4   Period Weeks   Status On-going  pain with abduction AROM   PT SHORT TERM GOAL #3   Title report 30% improvement in ADLs without increase in pain (02/22/14)   Time 4   Period Weeks   Status Achieved           PT Long Term Goals - 02/09/15 1637    PT LONG TERM GOAL #1   Title independent with advanced HEP (03/23/15)   Time 8   Period Weeks   Status On-going   PT LONG TERM GOAL #2   Title verbalize understanding of posture/body mechanics to reduce risk of reinjury (03/23/15)   Time 8   Period Weeks   Status Achieved   PT LONG TERM GOAL #3   Title improve R shoulder strength to at least 4/5 all motions for improved strength and function (03/23/15)   Time 8   Period Weeks   Status Partially Met  shoulder flexion 4-/5   PT LONG TERM GOAL #4   Title report not increase in pain with ADLs and no need for compensatory strategies (03/23/15)   Time 8   Period Weeks   Status On-going               Plan - 02/11/15 1656    Clinical Impression Statement Pt is progressing well. She sees MD tomorrow and plans to reduce frequency to 1 x week due to being diligent with HEP and good progress thus far. She reports less pain with ADLS and demonstrates increased strength and AROM.    PT Next Visit Plan Check strengthROM and goals weekly, add to HEP as needed , continue Ionto, may try tape after ionto        Problem List Patient Active Problem List   Diagnosis Date Noted  . Right shoulder injury 01/06/2015  . Prediabetes  04/02/2014  . Avulsion fracture of trochanter of femur 10/17/2013  . Shoulder dislocation 10/17/2013  . Anxiety 10/17/2013  . Nausea alone  03/05/2013  . Other dysphagia 12/03/2012  . Odynophagia 11/28/2012  . 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 S/P lobectomy of lung 11/18/2012  . Weight loss 12/13/2011  . Annual physical exam 06/23/2011  . INSOMNIA-SLEEP DISORDER-UNSPEC 11/22/2010  . PALPITATIONS 02/27/2009  . CIGARETTE SMOKER 12/24/2007  . BRONCHIECTASIS 12/24/2007  . ISCHEMIC COLITIS 12/24/2007  . HEPATIC CYST 12/24/2007  . HEADACHES, HX OF 12/24/2007  . B12 DEFICIENCY 12/30/2006  . ASTHMA 12/30/2006  . COPD GOLD 0  12/30/2006  . GERD 12/30/2006  . ENDOMETRIOSIS NOS 12/30/2006  . LICHEN PLANUS 74/25/9563  . OSTEOARTHRITIS 12/30/2006  . OSTEOPOROSIS 12/30/2006    Dorene Ar, PTA 02/11/2015, 5:12 PM  [place], [place], [address] Phone: [phone]   Fax:  [phone]

## 2015-02-11 NOTE — Patient Instructions (Signed)
Shoulder Press  SIT IN [facility]  Stand or sit with arms bent, hands at shoulder level. Inhale, then while exhaling, extend arms toward ceiling. Hold 1 count. Slowly return to starting position. Repeat __10__ times per set. Do __2__ sets per session. Do ___2_ sessions per week. May be done with dumbbells, tubing or resistive band.  Abduction (Active)   Raise arm out to side, with elbow straight and palm facing downwards. Do not shrug shoulder or tilt trunk. Hold _5___ seconds. Repeat __10-20__ times. Do _2___ sessions per day.  Copyright  VHI. All rights reserved.

## 2015-02-13 ENCOUNTER — Ambulatory Visit (INDEPENDENT_AMBULATORY_CARE_PROVIDER_SITE_OTHER): Payer: 59 | Admitting: Family Medicine

## 2015-02-13 ENCOUNTER — Encounter: Payer: Self-pay | Admitting: Family Medicine

## 2015-02-13 VITALS — BP 127/86 | HR 89 | Ht 67.0 in | Wt 117.0 lb

## 2015-02-13 DIAGNOSIS — S4991XD Unspecified injury of right shoulder and upper arm, subsequent encounter: Secondary | ICD-10-CM

## 2015-02-17 NOTE — Progress Notes (Signed)
PCP: Kathlene November, MD  Subjective:   HPI: Patient is a 59 y.o. female here for right shoulder injury.  1/29: Patient reports first injury to right shoulder occurred 10/16/2013. She was walking to her car in a concrete parking deck when she tripped and fell - sustained an avulsion fracture of her hip and an injury to her right shoulder. Was in the hospital for this - there was debate however on what she did to her shoulder - initially told she had dislocated it but orthopedics disagreed with this. She reports when they were doing her x-rays of her shoulder it felt like the shoulder 'popped back in' She did 4 months of PT and 1 month of home exercises but never felt like the shoulder got back to 100%. Never had an MRI of this shoulder but was given 5% disability for it. Has had some clicking and feelings of shoulder sticking. Then on 1/23 she jerked her arm forward when washing dishes and it felt like shoulder came out of socket. Made her nauseous and dizzy. Has been icing. Prior orthopedist would not see her - no records currently available. Limited use of this arm since accident 1/23.  3/11: Patient reports she's doing much better. Doing physical therapy and home exercises. No pain currently. Happy with her progress to date. No feelings of instability.  Past Medical History  Diagnosis Date  . COPD (chronic obstructive pulmonary disease)     per CT 11/10/05, will minimal bronchiectasis rll  . GERD (gastroesophageal reflux disease)   . Osteoporosis     dexa per gyn  . 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 Headache(784.0)   . Lichen planus   . B12 deficiency   . Endometriosis   . Cataracts, bilateral   . Arthritis   . Asthma     .  years ago- has not had problem for years.  . Hemorrhoid   . Pneumothorax, spontaneous, tension     x 2 in her 20's.  has a chest tube for one  . 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 Bronchiectasis     Current Outpatient Prescriptions on File Prior to Visit  Medication Sig Dispense Refill  . acetaminophen (TYLENOL) 500 MG tablet Take 500 mg by mouth every 4 (four) hours as needed for moderate pain or fever. For fever    . ALPRAZolam (XANAX) 0.5 MG tablet TAKE 1/2 TO 1 TABLET BY MOUTH AT BEDTIME AS NEEDED FOR INSOMNIA 90 tablet 0  . amoxicillin-clavulanate (AUGMENTIN) 250-125 MG per tablet Take 1 tablet by mouth 3 (three) times daily.    Marland Kitchen aspirin 81 MG chewable tablet Chew 81 mg by mouth daily.    . beclomethasone (QVAR) 80 MCG/ACT inhaler Inhale 2 puffs into the lungs 2 (two) times daily.    . Biotin 1000 MCG tablet Take 1,000 mcg by mouth 3 (three) times daily.    . Cholecalciferol (VITAMIN D PO) Take 1 tablet by mouth daily.    Marland Kitchen HYDROcodone-acetaminophen (NORCO) 10-325 MG per tablet Take 1 tablet by mouth every 6 (six) hours as needed. 90 tablet 0  . ipratropium (ATROVENT HFA) 17 MCG/ACT inhaler Inhale 2 puffs into the lungs every 6 (six) hours as needed for wheezing.    Marland Kitchen LYSINE PO Take 1 tablet by mouth daily.    . methocarbamol (ROBAXIN) 500 MG tablet Take 1 tablet (500 mg total) by mouth every 8 (eight)  hours as needed for muscle spasms. 90 tablet 1  . moxifloxacin (AVELOX) 400 MG tablet Take 1 tablet (400 mg total) by mouth daily. (Patient not taking: Reported on 01/26/2015) 7 tablet 0  . nicotine (NICOTROL) 10 MG inhaler Inhale 10 mg into the lungs as needed.    . nicotine polacrilex (NICORETTE) 4 MG lozenge Place 4 mg inside cheek every 3 (three) hours as needed for smoking cessation. For nicotine cravings.    . pantoprazole (PROTONIX) 40 MG tablet TAKE 1 TABLET BY MOUTH TWICE DAILY BEFORE A MEAL 60 tablet 4   No current facility-administered medications on file prior to visit.    Past Surgical History  Procedure Laterality Date  . Knee surgery      .not replacement .x8  . Ovarian cyst removed    . Video bronchoscopy  02/15/2012    Procedure:  VIDEO BRONCHOSCOPY WITH FLUORO;  Surgeon: [name], MD;  Location: [facility];  Service: Endoscopy;  Laterality: Bilateral;  WASHING- INTERVENTION BIOPSIES INTERVENTION BRONCHOSCOPY WITH VIDEO  . Mediastinoscopy  05/25/2012    Procedure: MEDIASTINOSCOPY;  Surgeon: [name], MD;  Location: [facility] OR;  Service: Thoracic;  Laterality: N/A;  . Tonsillectomy    . Flexible bronchoscopy  11/05/2012    Procedure: FLEXIBLE BRONCHOSCOPY;  Surgeon: [name], MD;  Location: [place];  Service: Thoracic;  Laterality: N/A;  . Thoracotomy  11/05/2012    Procedure: THORACOTOMY MAJOR;  Surgeon: [name], MD;  Location: [place];  Service: Thoracic;  Laterality: Right;  . Lobectomy  11/05/2012    Procedure: LOBECTOMY;  Surgeon: [name], MD;  Location: [facility] OR;  Service: Thoracic;  Laterality: Right;  right upper lobectomy  . Esophagogastroduodenoscopy  12/03/2012    Procedure: ESOPHAGOGASTRODUODENOSCOPY (EGD);  Surgeon: [name], MD;  Location: [facility] ENDOSCOPY;  Service: Endoscopy;  Laterality: N/A;    Allergies  Allergen Reactions  . Alendronate Sodium Other (See Comments)    REACTION: chest pain  . Antihistamines, Chlorpheniramine-Type Other (See Comments)    REACTION: "makes lungs bleed"  . Benadryl [Diphenhydramine] Other (See Comments)    REACTION: "makes lungs bleed"  . Nsaids Other (See Comments)    REACTION: ischemic colitis  . Other Other (See Comments)    All anti-inflammatories.  REACTION: ischemic colitis  . Pseudoephedrine Other (See Comments)    REACTION: Ischemic colitis  . Robitussin A-C [Guaifenesin-Codeine] Itching and Other (See Comments)    Palms itching  . Levofloxacin Anxiety    History   Social History  . Marital Status: Single    Spouse Name: N/A  . Number of Children: 0  . Years of Education: N/A   Occupational History  . 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 [place]   Social History Main Topics  . Smoking status: Current Every Day Smoker  -- 0.40 packs/day for 38 years    Types: Cigarettes  . Smokeless tobacco: Never Used     Comment: "I don't inhale"  . Alcohol Use: 4.2 oz/week    7 Glasses of wine per week  . Drug Use: No  . Sexual Activity: Not on file   Other Topics Concern  . Not on file   Social History Narrative   sister lives w/ pt    Lost mom 12-2013    Family History  Problem Relation Age of Onset  . Colon cancer Neg Hx   . Breast cancer Neg Hx   . Esophageal  cancer Neg Hx   . Rectal cancer Neg Hx   . Stomach cancer Neg Hx   . Diabetes Mother     GM  . Heart disease Mother     in her 70s  . Heart disease Maternal Grandfather   . Cancer Father     sarcoma  . Diabetes Other     multiple    BP 127/86 mmHg  Pulse 89  Ht 5\' 7"  (1.702 m)  Wt 117 lb (53.071 kg)  BMI 18.32 kg/m2  LMP 11/05/2004  Review of Systems: See HPI above.    Objective:  Physical Exam:  Gen: NAD  Right shoulder: No swelling, ecchymoses.  No gross deformity. Minimal anterior tenderness. FROM without painful arc. Negative Hawkins, Neers. Negative Speeds, Yergasons. Strength 5/5 with empty can and resisted internal/external rotation.  No pain. Negative positive apprehension. Positive sulcus.    Assessment & Plan:  1. Right shoulder injury - consistent with recurrent shoulder subluxation and instability based on history and exam.  Much improved with physical therapy and home exercises.  Encouraged to continue with therapy and transition to home exercise program.  F/u prn.

## 2015-02-17 NOTE — Assessment & Plan Note (Signed)
consistent with recurrent shoulder subluxation and instability based on history and exam.  Much improved with physical therapy and home exercises.  Encouraged to continue with therapy and transition to home exercise program.  F/u prn.

## 2015-02-18 ENCOUNTER — Ambulatory Visit: Payer: 59 | Admitting: Rehabilitation

## 2015-02-18 DIAGNOSIS — M25511 Pain in right shoulder: Secondary | ICD-10-CM | POA: Diagnosis not present

## 2015-02-18 DIAGNOSIS — R29898 Other symptoms and signs involving the musculoskeletal system: Secondary | ICD-10-CM

## 2015-02-18 NOTE — Therapy (Signed)
[place], [place], [address] Phone: [phone]   Fax:  [phone]  Physical Therapy Treatment  Patient Details  Name: [name] [name] MRN: [id number] Date of Birth: 1956/07/12 Referring Provider:  Colon Branch, MD  Encounter Date: 02/18/2015      PT End of Session - 02/18/15 0740    Visit Number 6   Number of Visits 16   Date for PT Re-Evaluation 03/27/15   PT Start Time 0735   PT Stop Time 0802   PT Time Calculation (min) 27 min      Past Medical History  Diagnosis Date  . COPD (chronic obstructive pulmonary disease)     per CT 11/10/05, will minimal bronchiectasis rll  . GERD (gastroesophageal reflux disease)   . Osteoporosis     dexa per gyn  . 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 Headache(784.0)   . Lichen planus   . B12 deficiency   . Endometriosis   . Cataracts, bilateral   . Arthritis   . Asthma     .  years ago- has not had problem for years.  . Hemorrhoid   . Pneumothorax, spontaneous, tension     x 2 in her 20's.  has a chest tube for one  . 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 Bronchiectasis     Past Surgical History  Procedure Laterality Date  . Knee surgery      .not replacement .x8  . Ovarian cyst removed    . Video bronchoscopy  02/15/2012    Procedure: VIDEO BRONCHOSCOPY WITH FLUORO;  Surgeon: [name], MD;  Location: [facility];  Service: Endoscopy;  Laterality: Bilateral;  WASHING- INTERVENTION BIOPSIES INTERVENTION BRONCHOSCOPY WITH VIDEO  . Mediastinoscopy  05/25/2012    Procedure: MEDIASTINOSCOPY;  Surgeon: [name], MD;  Location: [facility] OR;  Service: Thoracic;  Laterality: N/A;  . Tonsillectomy    . Flexible bronchoscopy  11/05/2012    Procedure: FLEXIBLE BRONCHOSCOPY;  Surgeon: [name], MD;  Location: [place];  Service: Thoracic;  Laterality: N/A;  . Thoracotomy  11/05/2012   Procedure: THORACOTOMY MAJOR;  Surgeon: [name], MD;  Location: [place];  Service: Thoracic;  Laterality: Right;  . Lobectomy  11/05/2012    Procedure: LOBECTOMY;  Surgeon: [name], MD;  Location: [facility] OR;  Service: Thoracic;  Laterality: Right;  right upper lobectomy  . Esophagogastroduodenoscopy  12/03/2012    Procedure: ESOPHAGOGASTRODUODENOSCOPY (EGD);  Surgeon: [name], MD;  Location: [facility] ENDOSCOPY;  Service: Endoscopy;  Laterality: N/A;    There were no vitals filed for this visit.  Visit Diagnosis:  Weakness of right upper extremity  Right shoulder pain      Subjective Assessment - 02/18/15 0738    Symptoms I have pain with crossing my body   Currently in Pain? No/denies   Aggravating Factors  driving  too long   Pain Relieving Factors going to sleep                       [facility] Adult PT Treatment/Exercise - 02/18/15 0746    Shoulder Exercises: Standing   Horizontal ABduction Both;10 reps;Theraband   Theraband Level (Shoulder Horizontal ABduction) Level 2 (Red)   Horizontal ABduction Limitations also diagonals x 10 each   Extension Both;10 reps;Theraband   Theraband Level (Shoulder Extension) Level 2 (Red)  Row Both;10 reps;Theraband   Theraband Level (Shoulder Row) Level 2 (Red)   Other Standing Exercises [place] Red band x 10-15 all planes- pt prefers to modify the exercises so that she does not have to place knot in the door.    Shoulder Exercises: ROM/Strengthening   Other ROM/Strengthening Exercises horizontal bduction stretch across body 3 x 20 sec, increased pain   Iontophoresis   Type of Iontophoresis Dexamethasone   Location Right posterior shouler   Dose 1.0 cc dex   Time 6 hours Stat patch                  PT Short Term Goals - 02/18/15 0742    PT SHORT TERM GOAL #1   Title independent with HEP (02/23/15)   Time 4   Period Weeks   Status Achieved   PT SHORT TERM GOAL #2   Title perform R shoulder ROM without end  range pain (02/22/14)   Time 4   Period Weeks   Status Achieved   PT SHORT TERM GOAL #3   Title report 30% improvement in ADLs without increase in pain (02/22/14)   Time 4   Period Weeks   Status Achieved           PT Long Term Goals - 02/09/15 1637    PT LONG TERM GOAL #1   Title independent with advanced HEP (03/23/15)   Time 8   Period Weeks   Status On-going   PT LONG TERM GOAL #2   Title verbalize understanding of posture/body mechanics to reduce risk of reinjury (03/23/15)   Time 8   Period Weeks   Status Achieved   PT LONG TERM GOAL #3   Title improve R shoulder strength to at least 4/5 all motions for improved strength and function (03/23/15)   Time 8   Period Weeks   Status Partially Met  shoulder flexion 4-/5   PT LONG TERM GOAL #4   Title report not increase in pain with ADLs and no need for compensatory strategies (03/23/15)   Time 8   Period Weeks   Status On-going               Plan - 02/18/15 1029    Clinical Impression Statement Continued weakness and pain wtih reach across body. All other AROM are painfree.    PT Next Visit Plan Check strengthROM and goals weekly, add to HEP as needed , continue Ionto, may try tape after ionto        Problem List Patient Active Problem List   Diagnosis Date Noted  . Right shoulder injury 01/06/2015  . Prediabetes 04/02/2014  . Avulsion fracture of trochanter of femur 10/17/2013  . Shoulder dislocation 10/17/2013  . Anxiety 10/17/2013  . Nausea alone 03/05/2013  . Other dysphagia 12/03/2012  . Odynophagia 11/28/2012  . 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 S/P lobectomy of lung 11/18/2012  . Weight loss 12/13/2011  . Annual physical exam 06/23/2011  . INSOMNIA-SLEEP DISORDER-UNSPEC 11/22/2010  . PALPITATIONS 02/27/2009  . CIGARETTE SMOKER 12/24/2007  . BRONCHIECTASIS 12/24/2007  . ISCHEMIC COLITIS 12/24/2007  . HEPATIC CYST 12/24/2007  . HEADACHES, HX OF 12/24/2007  . B12 DEFICIENCY  12/30/2006  . ASTHMA 12/30/2006  . COPD GOLD 0  12/30/2006  . GERD 12/30/2006  . ENDOMETRIOSIS NOS 12/30/2006  . LICHEN PLANUS 42/68/3419  . OSTEOARTHRITIS 12/30/2006  . OSTEOPOROSIS 12/30/2006    Dorene Ar, PTA 02/18/2015, 10:30 AM  [facility] [address]  [place], [place], [address] Phone: [phone]   Fax:  [phone]

## 2015-02-25 ENCOUNTER — Ambulatory Visit: Payer: 59 | Admitting: Physical Therapy

## 2015-02-25 DIAGNOSIS — R29898 Other symptoms and signs involving the musculoskeletal system: Secondary | ICD-10-CM

## 2015-02-25 DIAGNOSIS — M25511 Pain in right shoulder: Secondary | ICD-10-CM | POA: Diagnosis not present

## 2015-02-25 NOTE — Patient Instructions (Addendum)
Wall Shoulder Press-Out   With palms flat on wall, shoulder-width apart, and elbows straight, press shoulders back. Return. Repeat _10___ times or for __1__ minutes. Do _2___ sessions per day.  http://cc.exer.us/56   Copyright  VHI. All rights reserved.

## 2015-02-25 NOTE — Therapy (Signed)
[place], [place], [address] Phone: [phone]   Fax:  [phone]  Physical Therapy Treatment  Patient Details  Name: [name] [name] MRN: [id number] Date of Birth: May 24, 1956 Referring Provider:  Colon Branch, MD  Encounter Date: 02/25/2015      PT End of Session - 02/25/15 1559    Visit Number 7   Number of Visits 16   Date for PT Re-Evaluation 03/27/15   PT Start Time 0350   PT Stop Time 0440   PT Time Calculation (min) 50 min      Past Medical History  Diagnosis Date  . COPD (chronic obstructive pulmonary disease)     per CT 11/10/05, will minimal bronchiectasis rll  . GERD (gastroesophageal reflux disease)   . Osteoporosis     dexa per gyn  . 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 Headache(784.0)   . Lichen planus   . B12 deficiency   . Endometriosis   . Cataracts, bilateral   . Arthritis   . Asthma     .  years ago- has not had problem for years.  . Hemorrhoid   . Pneumothorax, spontaneous, tension     x 2 in her 20's.  has a chest tube for one  . 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 Bronchiectasis     Past Surgical History  Procedure Laterality Date  . Knee surgery      .not replacement .x8  . Ovarian cyst removed    . Video bronchoscopy  02/15/2012    Procedure: VIDEO BRONCHOSCOPY WITH FLUORO;  Surgeon: [name], MD;  Location: [facility];  Service: Endoscopy;  Laterality: Bilateral;  WASHING- INTERVENTION BIOPSIES INTERVENTION BRONCHOSCOPY WITH VIDEO  . Mediastinoscopy  05/25/2012    Procedure: MEDIASTINOSCOPY;  Surgeon: [name], MD;  Location: [facility] OR;  Service: Thoracic;  Laterality: N/A;  . Tonsillectomy    . Flexible bronchoscopy  11/05/2012    Procedure: FLEXIBLE BRONCHOSCOPY;  Surgeon: [name], MD;  Location: [place];  Service: Thoracic;  Laterality: N/A;  . Thoracotomy  11/05/2012   Procedure: THORACOTOMY MAJOR;  Surgeon: [name], MD;  Location: [place];  Service: Thoracic;  Laterality: Right;  . Lobectomy  11/05/2012    Procedure: LOBECTOMY;  Surgeon: [name], MD;  Location: [facility] OR;  Service: Thoracic;  Laterality: Right;  right upper lobectomy  . Esophagogastroduodenoscopy  12/03/2012    Procedure: ESOPHAGOGASTRODUODENOSCOPY (EGD);  Surgeon: [name], MD;  Location: [facility] ENDOSCOPY;  Service: Endoscopy;  Laterality: N/A;    There were no vitals filed for this visit.  Visit Diagnosis:  Right shoulder pain  Weakness of right upper extremity      Subjective Assessment - 02/25/15 1645    Symptoms I had pain when I slept wrong   Currently in Pain? No/denies   Aggravating Factors  picking up heavy coffee pot   Pain Relieving Factors not aggravating it            [facility] PT Assessment - 02/25/15 1554    Strength   Right Shoulder Flexion 4/5   Right Shoulder Extension 4/5   Right Shoulder ABduction 4/5   Right Shoulder Internal Rotation 4/5   Right Shoulder External Rotation 4/5                   [facility] Adult PT Treatment/Exercise -  02/25/15 1600    Shoulder Exercises: Seated   Other Seated Exercises bicep curls 3 # x 20   Shoulder Exercises: Standing   Horizontal ABduction Both;20 reps;Theraband   Theraband Level (Shoulder Horizontal ABduction) Level 2 (Red)   Horizontal ABduction Limitations also diagonals x 10 each   External Rotation Strengthening;Both;20 reps;Theraband   Theraband Level (Shoulder External Rotation) Level 2 (Red)   Extension Both;10 reps;Theraband   Theraband Level (Shoulder Extension) Level 3 (Green)   Row Both;10 reps   Theraband Level (Shoulder Row) Level 3 (Green)   Other Standing Exercises [place] advanced to red band  band x 10-15 all planes- pt prefers to modify the exercises so that she does not have to place knot in the door.    Shoulder Exercises: ROM/Strengthening   UBE (Upper Arm Bike) --    Iontophoresis   Type of Iontophoresis Dexamethasone   Location Right posterior shouler   Dose 1.0 cc dex   Time 6 hours Stat patch                  PT Short Term Goals - 02/18/15 0742    PT SHORT TERM GOAL #1   Title independent with HEP (02/23/15)   Time 4   Period Weeks   Status Achieved   PT SHORT TERM GOAL #2   Title perform R shoulder ROM without end range pain (02/22/14)   Time 4   Period Weeks   Status Achieved   PT SHORT TERM GOAL #3   Title report 30% improvement in ADLs without increase in pain (02/22/14)   Time 4   Period Weeks   Status Achieved           PT Long Term Goals - 02/09/15 1637    PT LONG TERM GOAL #1   Title independent with advanced HEP (03/23/15)   Time 8   Period Weeks   Status On-going   PT LONG TERM GOAL #2   Title verbalize understanding of posture/body mechanics to reduce risk of reinjury (03/23/15)   Time 8   Period Weeks   Status Achieved   PT LONG TERM GOAL #3   Title improve R shoulder strength to at least 4/5 all motions for improved strength and function (03/23/15)   Time 8   Period Weeks   Status Partially Met  shoulder flexion 4-/5   PT LONG TERM GOAL #4   Title report not increase in pain with ADLs and no need for compensatory strategies (03/23/15)   Time 8   Period Weeks   Status On-going               Plan - 02/25/15 1552    Clinical Impression Statement Pt reports she can pick up 8 cups in coffee pot but not 10 cups.    PT Next Visit Plan Check strengthROM and goals weekly, add to HEP as needed , Continue Strengthening and work toward DC        Problem List Patient Active Problem List   Diagnosis Date Noted  . Right shoulder injury 01/06/2015  . Prediabetes 04/02/2014  . Avulsion fracture of trochanter of femur 10/17/2013  . Shoulder dislocation 10/17/2013  . Anxiety 10/17/2013  . Nausea alone 03/05/2013  . Other dysphagia 12/03/2012  . Odynophagia 11/28/2012  . Mycobacterium kansasii  infection 11/26/2012  . S/P lobectomy of lung 11/18/2012  . Weight loss 12/13/2011  . Annual physical exam 06/23/2011  . INSOMNIA-SLEEP DISORDER-UNSPEC 11/22/2010  . PALPITATIONS 02/27/2009  .  CIGARETTE SMOKER 12/24/2007  . BRONCHIECTASIS 12/24/2007  . ISCHEMIC COLITIS 12/24/2007  . HEPATIC CYST 12/24/2007  . HEADACHES, HX OF 12/24/2007  . B12 DEFICIENCY 12/30/2006  . ASTHMA 12/30/2006  . COPD GOLD 0  12/30/2006  . GERD 12/30/2006  . ENDOMETRIOSIS NOS 12/30/2006  . LICHEN PLANUS 18/93/7374  . OSTEOARTHRITIS 12/30/2006  . OSTEOPOROSIS 12/30/2006    Dorene Ar, PTA 02/25/2015, 4:51 PM  [facility] [address] [place], [place], [address] Phone: [phone]   Fax:  [phone]

## 2015-03-03 ENCOUNTER — Encounter: Payer: Self-pay | Admitting: Family Medicine

## 2015-03-03 ENCOUNTER — Ambulatory Visit (INDEPENDENT_AMBULATORY_CARE_PROVIDER_SITE_OTHER): Payer: 59 | Admitting: Family Medicine

## 2015-03-03 VITALS — BP 110/66 | HR 81 | Temp 98.9°F | Wt 118.2 lb

## 2015-03-03 DIAGNOSIS — K219 Gastro-esophageal reflux disease without esophagitis: Secondary | ICD-10-CM | POA: Diagnosis not present

## 2015-03-03 DIAGNOSIS — R252 Cramp and spasm: Secondary | ICD-10-CM | POA: Diagnosis not present

## 2015-03-03 DIAGNOSIS — R1011 Right upper quadrant pain: Secondary | ICD-10-CM | POA: Diagnosis not present

## 2015-03-03 MED ORDER — RANITIDINE HCL 300 MG PO TABS: 300.0000 mg | ORAL_TABLET | Freq: Every day | ORAL | Status: DC

## 2015-03-03 NOTE — Progress Notes (Signed)
Pre visit review using our clinic review tool, if applicable. No additional management support is needed unless otherwise documented below in the visit note. 

## 2015-03-03 NOTE — Patient Instructions (Signed)

## 2015-03-03 NOTE — Progress Notes (Signed)
Patient ID: [name] [name], female    DOB: 01-May-1956  Age: 59 y.o. MRN: [id number]    Subjective:  Subjective HPI [name] [name] presents for mid epigastric pain,  Muscle cramps/ spasms and Ruq pain.  + nausea, no vomiting.  Pt stopped her protonix because she read that her pain may be from low acid.   Her gerd has been terrible since.    Review of Systems  Constitutional: Positive for appetite change. Negative for activity change, fatigue and unexpected weight change.  Respiratory: Negative for cough and shortness of breath.   Cardiovascular: Negative for chest pain and palpitations.  Gastrointestinal: Positive for abdominal pain. Negative for nausea, vomiting, diarrhea, constipation, blood in stool and rectal pain.  Psychiatric/Behavioral: Negative for behavioral problems and dysphoric mood. The patient is not nervous/anxious.     History Past Medical History  Diagnosis Date  . COPD (chronic obstructive pulmonary disease)     per CT 11/10/05, will minimal bronchiectasis rll  . GERD (gastroesophageal reflux disease)   . Osteoporosis     dexa per gyn  . 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 Headache(784.0)   . Lichen planus   . B12 deficiency   . Endometriosis   . Cataracts, bilateral   . Arthritis   . Asthma     .  years ago- has not had problem for years.  . Hemorrhoid   . Pneumothorax, spontaneous, tension     x 2 in her 20's.  has a chest tube for one  . 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 Bronchiectasis     She has past surgical history that includes Knee surgery; ovarian cyst removed; Video bronchoscopy (02/15/2012); Mediastinoscopy (05/25/2012); Tonsillectomy; Flexible bronchoscopy (11/05/2012); Thoracotomy (11/05/2012); Lobectomy (11/05/2012); and Esophagogastroduodenoscopy (12/03/2012).   Her family history includes Cancer in her father; Diabetes in her mother and other; Heart disease in her maternal  grandfather and mother. There is no history of Colon cancer, Breast cancer, Esophageal cancer, Rectal cancer, or Stomach cancer.She reports that she has been smoking Cigarettes.  She has a 15.2 pack-year smoking history. She has never used smokeless tobacco. She reports that she drinks about 4.2 oz of alcohol per week. She reports that she does not use illicit drugs.  Current Outpatient Prescriptions on File Prior to Visit  Medication Sig Dispense Refill  . acetaminophen (TYLENOL) 500 MG tablet Take 500 mg by mouth every 4 (four) hours as needed for moderate pain or fever. For fever    . ALPRAZolam (XANAX) 0.5 MG tablet TAKE 1/2 TO 1 TABLET BY MOUTH AT BEDTIME AS NEEDED FOR INSOMNIA 90 tablet 0  . aspirin 81 MG chewable tablet Chew 81 mg by mouth daily.    . beclomethasone (QVAR) 80 MCG/ACT inhaler Inhale 2 puffs into the lungs 2 (two) times daily.    . Biotin 1000 MCG tablet Take 1,000 mcg by mouth 3 (three) times daily.    . Cholecalciferol (VITAMIN D PO) Take 1 tablet by mouth daily.    Marland Kitchen HYDROcodone-acetaminophen (NORCO) 10-325 MG per tablet Take 1 tablet by mouth every 6 (six) hours as needed. 90 tablet 0  . ipratropium (ATROVENT HFA) 17 MCG/ACT inhaler Inhale 2 puffs into the lungs every 6 (six) hours as needed for wheezing.    Marland Kitchen LYSINE PO Take 1 tablet by mouth daily.    . methocarbamol (ROBAXIN) 500 MG tablet Take 1 tablet (500 mg total) by mouth  every 8 (eight) hours as needed for muscle spasms. 90 tablet 1  . nicotine (NICOTROL) 10 MG inhaler Inhale 10 mg into the lungs as needed.    . nicotine polacrilex (NICORETTE) 4 MG lozenge Place 4 mg inside cheek every 3 (three) hours as needed for smoking cessation. For nicotine cravings.    . pantoprazole (PROTONIX) 40 MG tablet TAKE 1 TABLET BY MOUTH TWICE DAILY BEFORE A MEAL 60 tablet 4  . tiotropium (SPIRIVA) 18 MCG inhalation capsule Place into inhaler and inhale.     No current facility-administered medications on file prior to visit.       Objective:  Objective Physical Exam  Constitutional: She is oriented to person, place, and time. She appears well-developed and well-nourished. No distress.  HENT:  Right Ear: External ear normal.  Left Ear: External ear normal.  Nose: Nose normal.  Mouth/Throat: Oropharynx is clear and moist.  Eyes: EOM are normal. Pupils are equal, round, and reactive to light.  Neck: Normal range of motion. Neck supple.  Cardiovascular: Normal rate, regular rhythm and normal heart sounds.   No murmur heard. Pulmonary/Chest: Effort normal and breath sounds normal. No respiratory distress. She has no wheezes. She has no rales. She exhibits no tenderness.  Abdominal: Soft. She exhibits no distension. There is tenderness. There is guarding. There is no rebound.    Neurological: She is alert and oriented to person, place, and time.  Psychiatric: She has a normal mood and affect. Her behavior is normal. Judgment and thought content normal.   BP 110/66 mmHg  Pulse 81  Temp(Src) 98.9 F (37.2 C) (Oral)  Wt 118 lb 3.2 oz (53.615 kg)  SpO2 96%  LMP 11/05/2004 Wt Readings from Last 3 Encounters:  03/03/15 118 lb 3.2 oz (53.615 kg)  02/13/15 117 lb (53.071 kg)  01/02/15 116 lb (52.617 kg)     Lab Results  Component Value Date   WBC 8.3 06/16/2014   HGB 13.0 06/16/2014   HCT 38.2 06/16/2014   PLT 369 06/16/2014   GLUCOSE 103* 06/16/2014   CHOL 185 04/02/2014   TRIG 47.0 04/02/2014   HDL 68.40 04/02/2014   LDLDIRECT 124.4 07/19/2007   LDLCALC 107* 04/02/2014   ALT 12 10/17/2013   AST 15 10/17/2013   NA 144 06/16/2014   K 4.1 06/16/2014   CL 102 06/16/2014   CREATININE 0.66 06/16/2014   BUN 6 06/16/2014   CO2 25 06/16/2014   TSH 0.61 12/16/2013   INR 1.07 06/16/2014   HGBA1C 5.4 04/02/2014    No results found.   Assessment & Plan:  Plan I have discontinued Ms. Koehler's moxifloxacin and amoxicillin-clavulanate. I am also having her start on ranitidine. Additionally, I am having  her maintain her nicotine polacrilex, acetaminophen, Biotin, nicotine, LYSINE PO, pantoprazole, aspirin, beclomethasone, Cholecalciferol (VITAMIN D PO), ipratropium, ALPRAZolam, methocarbamol, HYDROcodone-acetaminophen, and tiotropium.  Meds ordered this encounter  Medications  . ranitidine (ZANTAC) 300 MG tablet    Sig: Take 1 tablet (300 mg total) by mouth at bedtime.    Dispense:  30 tablet    Refill:  2    Problem List Items Addressed This Visit    GERD - Primary   Relevant Medications   ranitidine (ZANTAC) tablet   Other Relevant Orders   Basic metabolic panel   CBC with Differential/Platelet   Hepatic function panel   Magnesium   H. pylori antibody, IgG    Other Visit Diagnoses    Muscle cramps  Relevant Orders    Basic metabolic panel    CBC with Differential/Platelet    Hepatic function panel    Magnesium    H. pylori antibody, IgG    RUQ pain        Relevant Orders    US Abdomen Complete       Follow-up: Return if symptoms worsen or fail to improve.  Garnet Koyanagi, DO

## 2015-03-04 ENCOUNTER — Encounter: Payer: Self-pay | Admitting: Rehabilitation

## 2015-03-04 ENCOUNTER — Ambulatory Visit: Payer: 59 | Admitting: Physical Therapy

## 2015-03-04 ENCOUNTER — Other Ambulatory Visit (INDEPENDENT_AMBULATORY_CARE_PROVIDER_SITE_OTHER): Payer: 59

## 2015-03-04 DIAGNOSIS — K219 Gastro-esophageal reflux disease without esophagitis: Secondary | ICD-10-CM | POA: Diagnosis not present

## 2015-03-04 DIAGNOSIS — R29898 Other symptoms and signs involving the musculoskeletal system: Secondary | ICD-10-CM

## 2015-03-04 DIAGNOSIS — R252 Cramp and spasm: Secondary | ICD-10-CM | POA: Diagnosis not present

## 2015-03-04 DIAGNOSIS — M25511 Pain in right shoulder: Secondary | ICD-10-CM

## 2015-03-04 LAB — HEPATIC FUNCTION PANEL
ALT: 15 U/L (ref 0–35)
AST: 19 U/L (ref 0–37)
Albumin: 4.8 g/dL (ref 3.5–5.2)
Alkaline Phosphatase: 40 U/L (ref 39–117)
Bilirubin, Direct: 0.1 mg/dL (ref 0.0–0.3)
TOTAL PROTEIN: 7.7 g/dL (ref 6.0–8.3)
Total Bilirubin: 0.4 mg/dL (ref 0.2–1.2)

## 2015-03-04 LAB — CBC WITH DIFFERENTIAL/PLATELET
Basophils Absolute: 0 10*3/uL (ref 0.0–0.1)
Basophils Relative: 0.5 % (ref 0.0–3.0)
EOS ABS: 0.1 10*3/uL (ref 0.0–0.7)
Eosinophils Relative: 0.7 % (ref 0.0–5.0)
HCT: 40.6 % (ref 36.0–46.0)
HEMOGLOBIN: 13.7 g/dL (ref 12.0–15.0)
LYMPHS ABS: 2 10*3/uL (ref 0.7–4.0)
Lymphocytes Relative: 24.4 % (ref 12.0–46.0)
MCHC: 33.9 g/dL (ref 30.0–36.0)
MCV: 102.2 fl — ABNORMAL HIGH (ref 78.0–100.0)
MONOS PCT: 4 % (ref 3.0–12.0)
Monocytes Absolute: 0.3 10*3/uL (ref 0.1–1.0)
NEUTROS ABS: 5.9 10*3/uL (ref 1.4–7.7)
Neutrophils Relative %: 70.4 % (ref 43.0–77.0)
PLATELETS: 375 10*3/uL (ref 150.0–400.0)
RBC: 3.97 Mil/uL (ref 3.87–5.11)
RDW: 13.4 % (ref 11.5–15.5)
WBC: 8.4 10*3/uL (ref 4.0–10.5)

## 2015-03-04 LAB — BASIC METABOLIC PANEL
BUN: 8 mg/dL (ref 6–23)
CO2: 30 mEq/L (ref 19–32)
CREATININE: 0.74 mg/dL (ref 0.40–1.20)
Calcium: 9.9 mg/dL (ref 8.4–10.5)
Chloride: 105 mEq/L (ref 96–112)
GFR: 86.02 mL/min (ref >60.00)
GLUCOSE: 99 mg/dL (ref 70–99)
Potassium: 3.9 mEq/L (ref 3.5–5.1)
Sodium: 138 mEq/L (ref 135–145)

## 2015-03-04 LAB — H. PYLORI ANTIBODY, IGG: H Pylori IgG: NEGATIVE

## 2015-03-04 LAB — MAGNESIUM: Magnesium: 2.3 mg/dL (ref 1.5–2.5)

## 2015-03-04 NOTE — Therapy (Signed)
[place], [place], [address] Phone: [phone]   Fax:  [phone]  Physical Therapy Treatment  Patient Details  Name: [name] [name] MRN: [id number] Date of Birth: [birth year] Referring Provider:  Colon Branch, MD  Encounter Date: 03/04/2015      PT End of Session - 03/04/15 0947    Visit Number 8   Number of Visits 16   Date for PT Re-Evaluation 03/27/15   PT Start Time 0730   PT Stop Time 0800   PT Time Calculation (min) 30 min      Past Medical History  Diagnosis Date  . COPD (chronic obstructive pulmonary disease)     per CT 11/10/05, will minimal bronchiectasis rll  . GERD (gastroesophageal reflux disease)   . Osteoporosis     dexa per gyn  . 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 Headache(784.0)   . Lichen planus   . B12 deficiency   . Endometriosis   . Cataracts, bilateral   . Arthritis   . Asthma     .  years ago- has not had problem for years.  . Hemorrhoid   . Pneumothorax, spontaneous, tension     x 2 in her 20's.  has a chest tube for one  . 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 Bronchiectasis     Past Surgical History  Procedure Laterality Date  . Knee surgery      .not replacement .x8  . Ovarian cyst removed    . Video bronchoscopy  02/15/2012    Procedure: VIDEO BRONCHOSCOPY WITH FLUORO;  Surgeon: [name], MD;  Location: [facility];  Service: Endoscopy;  Laterality: Bilateral;  WASHING- INTERVENTION BIOPSIES INTERVENTION BRONCHOSCOPY WITH VIDEO  . Mediastinoscopy  05/25/2012    Procedure: MEDIASTINOSCOPY;  Surgeon: [name], MD;  Location: [facility] OR;  Service: Thoracic;  Laterality: N/A;  . Tonsillectomy    . Flexible bronchoscopy  11/05/2012    Procedure: FLEXIBLE BRONCHOSCOPY;  Surgeon: [name], MD;  Location: [place];  Service: Thoracic;  Laterality: N/A;  . Thoracotomy  11/05/2012   Procedure: THORACOTOMY MAJOR;  Surgeon: [name], MD;  Location: [place];  Service: Thoracic;  Laterality: Right;  . Lobectomy  11/05/2012    Procedure: LOBECTOMY;  Surgeon: [name], MD;  Location: [facility] OR;  Service: Thoracic;  Laterality: Right;  right upper lobectomy  . Esophagogastroduodenoscopy  12/03/2012    Procedure: ESOPHAGOGASTRODUODENOSCOPY (EGD);  Surgeon: [name], MD;  Location: [facility] ENDOSCOPY;  Service: Endoscopy;  Laterality: N/A;    There were no vitals filed for this visit.  Visit Diagnosis:  Right shoulder pain  Weakness of right upper extremity      Subjective Assessment - 03/04/15 0949    Symptoms Only pain when I reach across. The green band is challenging.   Currently in Pain? No/denies            [facility] PT Assessment - 03/04/15 0745    AROM   Right Shoulder Flexion 140 Degrees                   [facility] Adult PT Treatment/Exercise - 03/04/15 0001    Shoulder Exercises: Standing   Extension Both;10 reps;Theraband   Theraband Level (Shoulder Extension) Level 2 (Red)   Row Both;10 reps   Theraband Level (Shoulder Row) Level 2 (Red)   Other  Standing Exercises green band puch x 15   Shoulder Exercises: ROM/Strengthening   UBE (Upper Arm Bike) 2.5 min for, 2.5 min back -harder cues for posture   Manual Therapy   Manual Therapy Myofascial release   Myofascial Release soft tissue work and trigger point release to posterior shoulder including Teres                  PT Short Term Goals - 02/18/15 0742    PT SHORT TERM GOAL #1   Title independent with HEP (02/23/15)   Time 4   Period Weeks   Status Achieved   PT SHORT TERM GOAL #2   Title perform R shoulder ROM without end range pain (02/22/14)   Time 4   Period Weeks   Status Achieved   PT SHORT TERM GOAL #3   Title report 30% improvement in ADLs without increase in pain (02/22/14)   Time 4   Period Weeks   Status Achieved           PT Long Term Goals - 02/09/15  1637    PT LONG TERM GOAL #1   Title independent with advanced HEP (03/23/15)   Time 8   Period Weeks   Status On-going   PT LONG TERM GOAL #2   Title verbalize understanding of posture/body mechanics to reduce risk of reinjury (03/23/15)   Time 8   Period Weeks   Status Achieved   PT LONG TERM GOAL #3   Title improve R shoulder strength to at least 4/5 all motions for improved strength and function (03/23/15)   Time 8   Period Weeks   Status Partially Met  shoulder flexion 4-/5   PT LONG TERM GOAL #4   Title report not increase in pain with ADLs and no need for compensatory strategies (03/23/15)   Time 8   Period Weeks   Status On-going               Plan - 03/04/15 0945    Clinical Impression Statement Pt reports cross body movements are the most painful, 4/10 other wise pain is 1/10. She reports 2/10 pain after manual with horizontal abduction.   PT Next Visit Plan See P.T. for re-eval. continue manual and strengthening shoulder and scap        Problem List Patient Active Problem List   Diagnosis Date Noted  . Right shoulder injury 01/06/2015  . Prediabetes 04/02/2014  . Avulsion fracture of trochanter of femur 10/17/2013  . Shoulder dislocation 10/17/2013  . Anxiety 10/17/2013  . Nausea alone 03/05/2013  . Other dysphagia 12/03/2012  . Odynophagia 11/28/2012  . 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 S/P lobectomy of lung 11/18/2012  . Weight loss 12/13/2011  . Annual physical exam 06/23/2011  . INSOMNIA-SLEEP DISORDER-UNSPEC 11/22/2010  . PALPITATIONS 02/27/2009  . CIGARETTE SMOKER 12/24/2007  . BRONCHIECTASIS 12/24/2007  . ISCHEMIC COLITIS 12/24/2007  . HEPATIC CYST 12/24/2007  . HEADACHES, HX OF 12/24/2007  . B12 DEFICIENCY 12/30/2006  . ASTHMA 12/30/2006  . COPD GOLD 0  12/30/2006  . GERD 12/30/2006  . ENDOMETRIOSIS NOS 12/30/2006  . LICHEN PLANUS 76/54/6503  . OSTEOARTHRITIS 12/30/2006  . OSTEOPOROSIS 12/30/2006    Dorene Ar, PTA 03/04/2015, 9:53 AM  [facility] [address] [place], [place], [address] Phone: [phone]   Fax:  [phone]

## 2015-03-12 ENCOUNTER — Ambulatory Visit
Admission: RE | Admit: 2015-03-12 | Discharge: 2015-03-12 | Disposition: A | Discharge status: Home / Self Care | Payer: 59 | Source: Ambulatory Visit | Attending: Family Medicine | Admitting: Family Medicine

## 2015-03-12 DIAGNOSIS — R1011 Right upper quadrant pain: Secondary | ICD-10-CM

## 2015-03-13 ENCOUNTER — Other Ambulatory Visit: Payer: Self-pay

## 2015-03-13 MED ORDER — PANTOPRAZOLE SODIUM 40 MG PO TBEC: DELAYED_RELEASE_TABLET | ORAL | Status: DC

## 2015-03-16 ENCOUNTER — Ambulatory Visit: Payer: 59 | Attending: Family Medicine | Admitting: Physical Therapy

## 2015-03-16 ENCOUNTER — Telehealth: Payer: Self-pay | Admitting: Internal Medicine

## 2015-03-16 DIAGNOSIS — M25511 Pain in right shoulder: Secondary | ICD-10-CM | POA: Diagnosis not present

## 2015-03-16 DIAGNOSIS — R29898 Other symptoms and signs involving the musculoskeletal system: Secondary | ICD-10-CM | POA: Diagnosis not present

## 2015-03-16 DIAGNOSIS — R293 Abnormal posture: Secondary | ICD-10-CM

## 2015-03-16 MED ORDER — HYDROCODONE-ACETAMINOPHEN 10-325 MG PO TABS: 1.0000 | ORAL_TABLET | Freq: Four times a day (QID) | ORAL | Status: DC | PRN

## 2015-03-16 NOTE — Therapy (Signed)
[place], [place], [address] Phone: [phone]   Fax:  [phone]  Physical Therapy Treatment  Patient Details  Name: [name] [name] MRN: [id number] Date of Birth: 05/07/1956 Referring Provider:  Colon Branch, MD  Encounter Date: 03/16/2015      PT End of Session - 03/16/15 0857    Visit Number 9   Number of Visits 16   Date for PT Re-Evaluation 05/15/15   PT Start Time 0800   PT Stop Time 0850   PT Time Calculation (min) 50 min   Activity Tolerance Patient tolerated treatment well      Past Medical History  Diagnosis Date  . COPD (chronic obstructive pulmonary disease)     per CT 11/10/05, will minimal bronchiectasis rll  . GERD (gastroesophageal reflux disease)   . Osteoporosis     dexa per gyn  . 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 Headache(784.0)   . Lichen planus   . B12 deficiency   . Endometriosis   . Cataracts, bilateral   . Arthritis   . Asthma     .  years ago- has not had problem for years.  . Hemorrhoid   . Pneumothorax, spontaneous, tension     x 2 in her 20's.  has a chest tube for one  . 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 Bronchiectasis     Past Surgical History  Procedure Laterality Date  . Knee surgery      .not replacement .x8  . Ovarian cyst removed    . Video bronchoscopy  02/15/2012    Procedure: VIDEO BRONCHOSCOPY WITH FLUORO;  Surgeon: [name], MD;  Location: [facility];  Service: Endoscopy;  Laterality: Bilateral;  WASHING- INTERVENTION BIOPSIES INTERVENTION BRONCHOSCOPY WITH VIDEO  . Mediastinoscopy  05/25/2012    Procedure: MEDIASTINOSCOPY;  Surgeon: [name], MD;  Location: [facility] OR;  Service: Thoracic;  Laterality: N/A;  . Tonsillectomy    . Flexible bronchoscopy  11/05/2012    Procedure: FLEXIBLE BRONCHOSCOPY;  Surgeon: [name], MD;  Location: [place];  Service:  Thoracic;  Laterality: N/A;  . Thoracotomy  11/05/2012    Procedure: THORACOTOMY MAJOR;  Surgeon: [name], MD;  Location: [place];  Service: Thoracic;  Laterality: Right;  . Lobectomy  11/05/2012    Procedure: LOBECTOMY;  Surgeon: [name], MD;  Location: [facility] OR;  Service: Thoracic;  Laterality: Right;  right upper lobectomy  . Esophagogastroduodenoscopy  12/03/2012    Procedure: ESOPHAGOGASTRODUODENOSCOPY (EGD);  Surgeon: [name], MD;  Location: [facility] ENDOSCOPY;  Service: Endoscopy;  Laterality: N/A;    There were no vitals filed for this visit.  Visit Diagnosis:  Right shoulder pain  Weakness of right upper extremity  Abnormal posture      Subjective Assessment - 03/16/15 0853    Subjective Still has pain when reaching across. I forgot my bands today.  Does HEP and feels she is improving.    Currently in Pain? Yes   Pain Score 1    Pain Location Shoulder   Pain Orientation Right   Pain Descriptors / Indicators Aching   Pain Type Chronic pain   Pain Onset More than a month ago   Pain Frequency Intermittent   Multiple Pain Sites No            [facility] PT Assessment - 03/16/15 7867  AROM   Right Shoulder Extension 32 Degrees   Right Shoulder Flexion 140 Degrees   Right Shoulder ABduction 150 Degrees   Right Shoulder Internal Rotation --  reach to T8   Right Shoulder External Rotation --  reach to C7          [facility] Adult PT Treatment/Exercise - 03/16/15 2951    Shoulder Exercises: Standing   Horizontal ABduction Strengthening;Both;15 reps;Theraband   Theraband Level (Shoulder Horizontal ABduction) Level 2 (Red)   Retraction Strengthening;Both;20 reps   Theraband Level (Shoulder Retraction) Other (comment)   Retraction Weight (lbs) against ball at scapulae   Other Standing Exercises diagonal pull red band x 10    Shoulder Exercises: ROM/Strengthening   UBE (Upper Arm Bike) 3 min FW, 3 min back, level 5, cues for posture   Manual Therapy   Manual  Therapy Myofascial release;Scapular mobilization   Myofascial Release soft tissue work and trigger point release to posterior shoulder including Teres  tolerated mod pressure to Rt. upper trap, supraspin., LS   Scapular Mobilization addressed subscapularis in sidelying due to frequent m cramps           PT Education - 03/16/15 0856    Education provided Yes   Education Details posture, trigger point dry needling.    Person(s) Educated Patient   Methods Explanation   Comprehension Verbalized understanding          PT Short Term Goals - 03/16/15 0906    PT SHORT TERM GOAL #1   Title (p) independent with HEP (02/23/15)   Status (p) Achieved   PT SHORT TERM GOAL #2   Title (p) perform R shoulder ROM without end range pain (02/22/14)   Status (p) Achieved   PT SHORT TERM GOAL #3   Title (p) report 30% improvement in ADLs without increase in pain (02/22/14)   Status (p) Achieved           PT Long Term Goals - 03/16/15 0934    PT LONG TERM GOAL #1   Title independent with advanced HEP (03/23/15)   Status On-going   PT LONG TERM GOAL #2   Title verbalize understanding of posture/body mechanics to reduce risk of reinjury (03/23/15)   Status Achieved   PT LONG TERM GOAL #3   Title improve R shoulder strength to at least 4/5 all motions for improved strength and function (03/23/15)   Status Partially Met   PT LONG TERM GOAL #4   Title report not increase in pain with ADLs and no need for compensatory strategies (03/23/15)   Status On-going   PT LONG TERM GOAL #5   Title Pt will be able to lift a 10 cup coffee pot with Rt. UE and report no lasting pain.    Time 8   Period Weeks   Status New               Plan - 03/16/15 0857    Clinical Impression Statement Patient reports improvement of symptoms following soft tissue work, but does normally have increased pain the day after PT.  She is responding well to strengthening, manual.  Cont to to have difficulty with lifting  and maintaining decent corrected posture. .     Pt will benefit from skilled therapeutic intervention in order to improve on the following deficits Improper body mechanics;Postural dysfunction;Pain;Decreased strength;Impaired UE functional use   PT Frequency 1x / week   PT Duration 8 weeks   PT Treatment/Interventions ADLs/Self Care Home Management;Cryotherapy;Electrical Stimulation;Functional  mobility training;Neuromuscular re-education;Ultrasound;Manual techniques;Passive range of motion;Therapeutic exercise;Moist Heat;Therapeutic activities;Patient/family education   PT Next Visit Plan cont with scapular stab ex, ensure proper posture with shoulder girdle strength.  May try prone reformer ex?   PT Home Exercise Plan cont with previous   Consulted and Agree with Plan of Care Patient        Problem List Patient Active Problem List   Diagnosis Date Noted  . Right shoulder injury 01/06/2015  . Prediabetes 04/02/2014  . Avulsion fracture of trochanter of femur 10/17/2013  . Shoulder dislocation 10/17/2013  . Anxiety 10/17/2013  . Nausea alone 03/05/2013  . Other dysphagia 12/03/2012  . Odynophagia 11/28/2012  . 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 S/P lobectomy of lung 11/18/2012  . Weight loss 12/13/2011  . Annual physical exam 06/23/2011  . INSOMNIA-SLEEP DISORDER-UNSPEC 11/22/2010  . PALPITATIONS 02/27/2009  . CIGARETTE SMOKER 12/24/2007  . BRONCHIECTASIS 12/24/2007  . ISCHEMIC COLITIS 12/24/2007  . HEPATIC CYST 12/24/2007  . HEADACHES, HX OF 12/24/2007  . B12 DEFICIENCY 12/30/2006  . ASTHMA 12/30/2006  . COPD GOLD 0  12/30/2006  . GERD 12/30/2006  . ENDOMETRIOSIS NOS 12/30/2006  . LICHEN PLANUS 37/09/6268  . OSTEOARTHRITIS 12/30/2006  . OSTEOPOROSIS 12/30/2006    Tahira Olivarez 03/16/2015, 9:41 AM  [facility] [address] [place], [place], [address] Phone: [phone]   Fax:  [phone]

## 2015-03-16 NOTE — Telephone Encounter (Signed)
Rx ready for pick up, placed at front desk by [facility].

## 2015-03-16 NOTE — Telephone Encounter (Signed)
Refill granted.  Rx at front desk ready for pickup.

## 2015-03-16 NOTE — Patient Instructions (Signed)
Cont with current HEP, asked her to do against a wall to ensure proper technique.

## 2015-03-16 NOTE — Telephone Encounter (Signed)
Called pt and informed below.

## 2015-03-16 NOTE — Telephone Encounter (Signed)
Pt is requesting refill on Hydrocodone.  Last OV: 12/16/2014 Last Fill: 02/04/2015 #90 2RFs UDS: 04/04/2014 Low risk  Please advise

## 2015-03-16 NOTE — Telephone Encounter (Signed)
Caller name: Wynette Relation to PT:WSFK Call back number: [phone] Pharmacy:   Reason for call:  Pt is requesting rx for HYDROcodone-acetaminophen (NORCO) 10-325 MG per tablet. Please advise.

## 2015-03-17 ENCOUNTER — Encounter: Payer: Self-pay | Admitting: Physical Therapy

## 2015-03-19 ENCOUNTER — Telehealth: Payer: Self-pay | Admitting: Internal Medicine

## 2015-03-19 ENCOUNTER — Ambulatory Visit: Payer: 59 | Admitting: Physical Therapy

## 2015-03-19 DIAGNOSIS — R29898 Other symptoms and signs involving the musculoskeletal system: Secondary | ICD-10-CM

## 2015-03-19 DIAGNOSIS — R293 Abnormal posture: Secondary | ICD-10-CM

## 2015-03-19 DIAGNOSIS — M25511 Pain in right shoulder: Secondary | ICD-10-CM | POA: Diagnosis not present

## 2015-03-19 NOTE — Therapy (Signed)
[place], [place], [address] Phone: [phone]   Fax:  [phone]  Physical Therapy Treatment  Patient Details  Name: [name] [name] MRN: [id number] Date of Birth: May 24, 1956 Referring Provider:  Colon Branch, MD  Encounter Date: 03/19/2015      PT End of Session - 03/19/15 1556    Visit Number 10   Number of Visits 16   Date for PT Re-Evaluation 05/15/15   PT Start Time 1517   PT Stop Time 1624   PT Time Calculation (min) 39 min   Activity Tolerance Patient tolerated treatment well      Past Medical History  Diagnosis Date  . COPD (chronic obstructive pulmonary disease)     per CT 11/10/05, will minimal bronchiectasis rll  . GERD (gastroesophageal reflux disease)   . Osteoporosis     dexa per gyn  . 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 Headache(784.0)   . Lichen planus   . B12 deficiency   . Endometriosis   . Cataracts, bilateral   . Arthritis   . Asthma     .  years ago- has not had problem for years.  . Hemorrhoid   . Pneumothorax, spontaneous, tension     x 2 in her 20's.  has a chest tube for one  . 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 Bronchiectasis     Past Surgical History  Procedure Laterality Date  . Knee surgery      .not replacement .x8  . Ovarian cyst removed    . Video bronchoscopy  02/15/2012    Procedure: VIDEO BRONCHOSCOPY WITH FLUORO;  Surgeon: [name], MD;  Location: [facility];  Service: Endoscopy;  Laterality: Bilateral;  WASHING- INTERVENTION BIOPSIES INTERVENTION BRONCHOSCOPY WITH VIDEO  . Mediastinoscopy  05/25/2012    Procedure: MEDIASTINOSCOPY;  Surgeon: [name], MD;  Location: [facility] OR;  Service: Thoracic;  Laterality: N/A;  . Tonsillectomy    . Flexible bronchoscopy  11/05/2012    Procedure: FLEXIBLE BRONCHOSCOPY;  Surgeon: [name], MD;  Location: [place];  Service:  Thoracic;  Laterality: N/A;  . Thoracotomy  11/05/2012    Procedure: THORACOTOMY MAJOR;  Surgeon: [name], MD;  Location: [place];  Service: Thoracic;  Laterality: Right;  . Lobectomy  11/05/2012    Procedure: LOBECTOMY;  Surgeon: [name], MD;  Location: [facility] OR;  Service: Thoracic;  Laterality: Right;  right upper lobectomy  . Esophagogastroduodenoscopy  12/03/2012    Procedure: ESOPHAGOGASTRODUODENOSCOPY (EGD);  Surgeon: [name], MD;  Location: [facility] ENDOSCOPY;  Service: Endoscopy;  Laterality: N/A;    There were no vitals filed for this visit.  Visit Diagnosis:  Right shoulder pain  Weakness of right upper extremity  Abnormal posture      Subjective Assessment - 03/19/15 1557    Subjective No pain today until I reached the wrong way.    Currently in Pain? Yes   Pain Score 1    Pain Location Shoulder   Pain Orientation Right           [facility] Adult PT Treatment/Exercise - 03/19/15 1559    Lumbar Exercises: Supine   Ab Set 5 reps   Other Supine Lumbar Exercises clam bilateral and unilateral x 10 each on foam roller   Lumbar Exercises: Quadruped   Straight Leg Raise 5 reps;5 seconds   Straight Leg  Raises Limitations pain in Rt. UE unable to finish 5 reps   Shoulder Exercises: Supine   Protraction Strengthening;Both;10 reps   Horizontal ABduction AAROM;Strengthening;10 reps   External Rotation Strengthening;Both;10 reps   Flexion Strengthening;20 reps   Other Supine Exercises above ex done on soft foam roller   Other Supine Exercises diagonal pull x 20 each yellow band   Shoulder Exercises: ROM/Strengthening   UBE (Upper Arm Bike) 5 min in reverse level 3           PT Short Term Goals - 03/16/15 0906    PT SHORT TERM GOAL #1   Title (p) independent with HEP (02/23/15)   Status (p) Achieved   PT SHORT TERM GOAL #2   Title (p) perform R shoulder ROM without end range pain (02/22/14)   Status (p) Achieved   PT SHORT TERM GOAL #3   Title (p) report 30%  improvement in ADLs without increase in pain (02/22/14)   Status (p) Achieved           PT Long Term Goals - 03/16/15 0934    PT LONG TERM GOAL #1   Title independent with advanced HEP (03/23/15)   Status On-going   PT LONG TERM GOAL #2   Title verbalize understanding of posture/body mechanics to reduce risk of reinjury (03/23/15)   Status Achieved   PT LONG TERM GOAL #3   Title improve R shoulder strength to at least 4/5 all motions for improved strength and function (03/23/15)   Status Partially Met   PT LONG TERM GOAL #4   Title report not increase in pain with ADLs and no need for compensatory strategies (03/23/15)   Status On-going   PT LONG TERM GOAL #5   Title Pt will be able to lift a 10 cup coffee pot with Rt. UE and report no lasting pain.    Time 8   Period Weeks   Status New               Plan - 03/19/15 1625    Clinical Impression Statement Patient with no increase in shoudler pain with higher level exercises today.  Emphasized core control today and stability.  Cont with POC   PT Next Visit Plan cont with scapular stab ex, ensure proper posture with shoulder girdle strength.  May try prone reformer ex?   PT Home Exercise Plan cont with previous   Consulted and Agree with Plan of Care Patient        Problem List Patient Active Problem List   Diagnosis Date Noted  . Right shoulder injury 01/06/2015  . Prediabetes 04/02/2014  . Avulsion fracture of trochanter of femur 10/17/2013  . Shoulder dislocation 10/17/2013  . Anxiety 10/17/2013  . Nausea alone 03/05/2013  . Other dysphagia 12/03/2012  . Odynophagia 11/28/2012  . 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 S/P lobectomy of lung 11/18/2012  . Weight loss 12/13/2011  . Annual physical exam 06/23/2011  . INSOMNIA-SLEEP DISORDER-UNSPEC 11/22/2010  . PALPITATIONS 02/27/2009  . CIGARETTE SMOKER 12/24/2007  . BRONCHIECTASIS 12/24/2007  . ISCHEMIC COLITIS 12/24/2007  . HEPATIC CYST 12/24/2007   . HEADACHES, HX OF 12/24/2007  . B12 DEFICIENCY 12/30/2006  . ASTHMA 12/30/2006  . COPD GOLD 0  12/30/2006  . GERD 12/30/2006  . ENDOMETRIOSIS NOS 12/30/2006  . LICHEN PLANUS 48/25/0037  . OSTEOARTHRITIS 12/30/2006  . OSTEOPOROSIS 12/30/2006    Edin Skarda 03/19/2015, 4:33 PM  [facility] [address] [place], [place], [address]  Phone: [phone]   Fax:  [phone]

## 2015-03-19 NOTE — Telephone Encounter (Signed)
Pre Visit Letter Sent °

## 2015-03-23 ENCOUNTER — Ambulatory Visit: Payer: 59 | Admitting: Physical Therapy

## 2015-03-23 DIAGNOSIS — R29898 Other symptoms and signs involving the musculoskeletal system: Secondary | ICD-10-CM

## 2015-03-23 DIAGNOSIS — M25511 Pain in right shoulder: Secondary | ICD-10-CM

## 2015-03-23 DIAGNOSIS — R293 Abnormal posture: Secondary | ICD-10-CM

## 2015-03-23 NOTE — Therapy (Signed)
[place], [place], [address] Phone: [phone]   Fax:  [phone]  Physical Therapy Treatment  Patient Details  Name: [name] [name] MRN: [id number] Date of Birth: 02/23/1956 Referring Provider:  Colon Branch, MD  Encounter Date: 03/23/2015      PT End of Session - 03/23/15 1549    Visit Number 11   Number of Visits 16   Date for PT Re-Evaluation 05/15/15   PT Start Time 0345   PT Stop Time 0430   PT Time Calculation (min) 45 min      Past Medical History  Diagnosis Date  . COPD (chronic obstructive pulmonary disease)     per CT 11/10/05, will minimal bronchiectasis rll  . GERD (gastroesophageal reflux disease)   . Osteoporosis     dexa per gyn  . 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 Headache(784.0)   . Lichen planus   . B12 deficiency   . Endometriosis   . Cataracts, bilateral   . Arthritis   . Asthma     .  years ago- has not had problem for years.  . Hemorrhoid   . Pneumothorax, spontaneous, tension     x 2 in her 20's.  has a chest tube for one  . 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 Bronchiectasis     Past Surgical History  Procedure Laterality Date  . Knee surgery      .not replacement .x8  . Ovarian cyst removed    . Video bronchoscopy  02/15/2012    Procedure: VIDEO BRONCHOSCOPY WITH FLUORO;  Surgeon: [name], MD;  Location: [facility];  Service: Endoscopy;  Laterality: Bilateral;  WASHING- INTERVENTION BIOPSIES INTERVENTION BRONCHOSCOPY WITH VIDEO  . Mediastinoscopy  05/25/2012    Procedure: MEDIASTINOSCOPY;  Surgeon: [name], MD;  Location: [facility] OR;  Service: Thoracic;  Laterality: N/A;  . Tonsillectomy    . Flexible bronchoscopy  11/05/2012    Procedure: FLEXIBLE BRONCHOSCOPY;  Surgeon: [name], MD;  Location: [place];  Service: Thoracic;  Laterality: N/A;  . Thoracotomy  11/05/2012   Procedure: THORACOTOMY MAJOR;  Surgeon: [name], MD;  Location: [place];  Service: Thoracic;  Laterality: Right;  . Lobectomy  11/05/2012    Procedure: LOBECTOMY;  Surgeon: [name], MD;  Location: [facility] OR;  Service: Thoracic;  Laterality: Right;  right upper lobectomy  . Esophagogastroduodenoscopy  12/03/2012    Procedure: ESOPHAGOGASTRODUODENOSCOPY (EGD);  Surgeon: [name], MD;  Location: [facility] ENDOSCOPY;  Service: Endoscopy;  Laterality: N/A;    There were no vitals filed for this visit.  Visit Diagnosis:  Right shoulder pain  Weakness of right upper extremity  Abnormal posture          [facility] PT Assessment - 03/23/15 1622    Observation/Other Assessments   Focus on Therapeutic Outcomes (FOTO)  38% limited on 03/17/2015   AROM   Right Shoulder Flexion 147 Degrees   Right Shoulder ABduction 158 Degrees   Right Shoulder Internal Rotation --  reach to T-8   Right Shoulder External Rotation --  reach to T2   Strength   Right Shoulder Flexion 4+/5   Right Shoulder ABduction 4/5   Right Shoulder Internal Rotation 4/5   Right Shoulder External Rotation 4/5  [place] Adult PT Treatment/Exercise - 03/23/15 1554    Shoulder Exercises: Supine   Protraction Strengthening;Both;10 reps   Protraction Weight (lbs) 2   Horizontal ABduction Strengthening;Both;12 reps;Theraband   Theraband Level (Shoulder Horizontal ABduction) Level 2 (Red)   Horizontal ABduction Limitations also horiz ADDuction 1 # x 10   External Rotation 20 reps   Flexion Strengthening;Both;10 reps   Theraband Level (Shoulder Flexion) Level 2 (Red)   Other Supine Exercises supine pullovers with red band    Other Supine Exercises diagonal pull x 20 each red band   Shoulder Exercises: ROM/Strengthening   UBE (Upper Arm Bike) 5 min in reverse level 1   Other ROM/Strengthening Exercises 2# cabinet reach x 20                  PT Short Term Goals - 03/16/15 0906     PT SHORT TERM GOAL #1   Title (p) independent with HEP (02/23/15)   Status (p) Achieved   PT SHORT TERM GOAL #2   Title (p) perform R shoulder ROM without end range pain (02/22/14)   Status (p) Achieved   PT SHORT TERM GOAL #3   Title (p) report 30% improvement in ADLs without increase in pain (02/22/14)   Status (p) Achieved           PT Long Term Goals - 03/23/15 1627    PT LONG TERM GOAL #1   Title independent with advanced HEP (03/23/15)   Time 8   Period Weeks   Status On-going   PT LONG TERM GOAL #2   Title verbalize understanding of posture/body mechanics to reduce risk of reinjury (03/23/15)   Time 8   Period Weeks   Status Achieved   PT LONG TERM GOAL #3   Title improve R shoulder strength to at least 4/5 all motions for improved strength and function (03/23/15)   Time 8   Period Weeks   Status Achieved   PT LONG TERM GOAL #4   Title report not increase in pain with ADLs and no need for compensatory strategies (03/23/15)   Time 8   Period Weeks   Status On-going   PT LONG TERM GOAL #5   Title Pt will be able to lift a 10 cup coffee pot with Rt. UE and report no lasting pain.    Time 8   Period Weeks   Status On-going               Plan - 03/23/15 1555    Clinical Impression Statement Pt reports she can "almost lft the 10 cup coffee pot." She continues to have pain with cross body movements in posterior shoulder. AROM and stretngth improved. Sees MD this week for follow up   PT Next Visit Plan cont with scapular stab ex, ensure proper posture with shoulder girdle strength.  May try prone reformer ex?         Problem List Patient Active Problem List   Diagnosis Date Noted  . Right shoulder injury 01/06/2015  . Prediabetes 04/02/2014  . Avulsion fracture of trochanter of femur 10/17/2013  . Shoulder dislocation 10/17/2013  . Anxiety 10/17/2013  . Nausea alone 03/05/2013  . Other dysphagia 12/03/2012  . Odynophagia 11/28/2012  . Mycobacterium  kansasii infection 11/26/2012  . S/P lobectomy of lung 11/18/2012  . Weight loss 12/13/2011  . Annual physical exam 06/23/2011  . INSOMNIA-SLEEP DISORDER-UNSPEC 11/22/2010  . PALPITATIONS 02/27/2009  . CIGARETTE SMOKER 12/24/2007  . BRONCHIECTASIS 12/24/2007  .  ISCHEMIC COLITIS 12/24/2007  . HEPATIC CYST 12/24/2007  . HEADACHES, HX OF 12/24/2007  . B12 DEFICIENCY 12/30/2006  . ASTHMA 12/30/2006  . COPD GOLD 0  12/30/2006  . GERD 12/30/2006  . ENDOMETRIOSIS NOS 12/30/2006  . LICHEN PLANUS 29/56/2130  . OSTEOARTHRITIS 12/30/2006  . OSTEOPOROSIS 12/30/2006    Dorene Ar , PTA  03/23/2015, 4:44 PM  [place], [place], [address] Phone: [phone]   Fax:  [phone]

## 2015-03-25 ENCOUNTER — Encounter: Payer: Self-pay | Admitting: Physical Therapy

## 2015-03-27 ENCOUNTER — Ambulatory Visit (INDEPENDENT_AMBULATORY_CARE_PROVIDER_SITE_OTHER): Payer: 59 | Admitting: Family Medicine

## 2015-03-27 ENCOUNTER — Encounter: Payer: Self-pay | Admitting: Family Medicine

## 2015-03-27 VITALS — BP 111/78 | HR 86 | Ht 67.0 in | Wt 118.0 lb

## 2015-03-27 DIAGNOSIS — S4991XD Unspecified injury of right shoulder and upper arm, subsequent encounter: Secondary | ICD-10-CM | POA: Diagnosis not present

## 2015-03-27 DIAGNOSIS — M549 Dorsalgia, unspecified: Secondary | ICD-10-CM

## 2015-03-27 DIAGNOSIS — M546 Pain in thoracic spine: Secondary | ICD-10-CM | POA: Diagnosis not present

## 2015-03-27 NOTE — Patient Instructions (Signed)
Continue with physical therapy and home exercises for your shoulder. I would recommend also doing some oblique, low back (latissimus) exercises - back extension, side crunches - work with physical therapy on exercises for these muscle groups also. Posture is another important thing that's contributing to your side pain. I don't think increased hydration, electrolytes are going to make a big difference here. Follow up with me in 6 weeks or as needed.

## 2015-03-31 DIAGNOSIS — M549 Dorsalgia, unspecified: Secondary | ICD-10-CM | POA: Insufficient documentation

## 2015-03-31 NOTE — Assessment & Plan Note (Addendum)
do not think this is related to prior chest tube placement.  Recommended she work on strengthening of involved muscle groups, focus on posture.

## 2015-03-31 NOTE — Assessment & Plan Note (Signed)
consistent with recurrent shoulder subluxation and instability based on history and exam.  Much improved with physical therapy and home exercises.  Encouraged to continue with therapy and transition to home exercise program.  F/u 6 weeks or prn.

## 2015-03-31 NOTE — Progress Notes (Signed)
PCP: Kathlene November, MD  Subjective:   HPI: Patient is a 59 y.o. female here for right shoulder injury.  1/29: Patient reports first injury to right shoulder occurred 10/16/2013. She was walking to her car in a concrete parking deck when she tripped and fell - sustained an avulsion fracture of her hip and an injury to her right shoulder. Was in the hospital for this - there was debate however on what she did to her shoulder - initially told she had dislocated it but orthopedics disagreed with this. She reports when they were doing her x-rays of her shoulder it felt like the shoulder 'popped back in' She did 4 months of PT and 1 month of home exercises but never felt like the shoulder got back to 100%. Never had an MRI of this shoulder but was given 5% disability for it. Has had some clicking and feelings of shoulder sticking. Then on 1/23 she jerked her arm forward when washing dishes and it felt like shoulder came out of socket. Made her nauseous and dizzy. Has been icing. Prior orthopedist would not see her - no records currently available. Limited use of this arm since accident 1/23.  3/11: Patient reports she's doing much better. Doing physical therapy and home exercises. No pain currently. Happy with her progress to date. No feelings of instability.  4/22: Patient reports she feels a lot better - about 70%. Motion feels good. Still doing PT and home exercises. Pain up to 4/10 at times. Has on a side note had some right sided back, rib cage to arm pain. Wasn't sure if this was related to prior chest tube placement. Doesn't do any specific strengthening of low back currently. Gets spasms in this area.  Past Medical History  Diagnosis Date  . COPD (chronic obstructive pulmonary disease)     per CT 11/10/05, will minimal bronchiectasis rll  . GERD (gastroesophageal reflux disease)   . Osteoporosis     dexa per gyn  . 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  Headache(784.0)   . Lichen planus   . B12 deficiency   . Endometriosis   . Cataracts, bilateral   . Arthritis   . Asthma     .  years ago- has not had problem for years.  . Hemorrhoid   . Pneumothorax, spontaneous, tension     x 2 in her 20's.  has a chest tube for one  . 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 Bronchiectasis     Current Outpatient Prescriptions on File Prior to Visit  Medication Sig Dispense Refill  . acetaminophen (TYLENOL) 500 MG tablet Take 500 mg by mouth every 4 (four) hours as needed for moderate pain or fever. For fever    . ALPRAZolam (XANAX) 0.5 MG tablet TAKE 1/2 TO 1 TABLET BY MOUTH AT BEDTIME AS NEEDED FOR INSOMNIA 90 tablet 0  . aspirin 81 MG chewable tablet Chew 81 mg by mouth daily.    . beclomethasone (QVAR) 80 MCG/ACT inhaler Inhale 2 puffs into the lungs 2 (two) times daily.    . Biotin 1000 MCG tablet Take 1,000 mcg by mouth 3 (three) times daily.    . Cholecalciferol (VITAMIN D PO) Take 1 tablet by mouth daily.    Marland Kitchen HYDROcodone-acetaminophen (NORCO) 10-325 MG per tablet Take 1 tablet by mouth every 6 (six) hours as needed. 90 tablet 0  . ipratropium (ATROVENT HFA) 17  MCG/ACT inhaler Inhale 2 puffs into the lungs every 6 (six) hours as needed for wheezing.    Marland Kitchen LYSINE PO Take 1 tablet by mouth daily.    . methocarbamol (ROBAXIN) 500 MG tablet Take 1 tablet (500 mg total) by mouth every 8 (eight) hours as needed for muscle spasms. 90 tablet 1  . nicotine (NICOTROL) 10 MG inhaler Inhale 10 mg into the lungs as needed.    . nicotine polacrilex (NICORETTE) 4 MG lozenge Place 4 mg inside cheek every 3 (three) hours as needed for smoking cessation. For nicotine cravings.    . pantoprazole (PROTONIX) 40 MG tablet TAKE 1 TABLET BY MOUTH TWICE DAILY BEFORE A MEAL 60 tablet 4  . tiotropium (SPIRIVA) 18 MCG inhalation capsule Place into inhaler and inhale.     No current facility-administered  medications on file prior to visit.    Past Surgical History  Procedure Laterality Date  . Knee surgery      .not replacement .x8  . Ovarian cyst removed    . Video bronchoscopy  02/15/2012    Procedure: VIDEO BRONCHOSCOPY WITH FLUORO;  Surgeon: [name], MD;  [facility]: [facility];  Service: Endoscopy;  Laterality: Bilateral;  WASHING- INTERVENTION BIOPSIES INTERVENTION BRONCHOSCOPY WITH VIDEO  . Mediastinoscopy  05/25/2012    Procedure: MEDIASTINOSCOPY;  Surgeon: [name], MD;  Location: [facility] OR;  Service: Thoracic;  Laterality: N/A;  . Tonsillectomy    . Flexible bronchoscopy  11/05/2012    Procedure: FLEXIBLE BRONCHOSCOPY;  Surgeon: [name], MD;  Location: [place];  Service: Thoracic;  Laterality: N/A;  . Thoracotomy  11/05/2012    Procedure: THORACOTOMY MAJOR;  Surgeon: [name], MD;  Location: [place];  Service: Thoracic;  Laterality: Right;  . Lobectomy  11/05/2012    Procedure: LOBECTOMY;  Surgeon: [name], MD;  Location: [facility] OR;  Service: Thoracic;  Laterality: Right;  right upper lobectomy  . Esophagogastroduodenoscopy  12/03/2012    Procedure: ESOPHAGOGASTRODUODENOSCOPY (EGD);  Surgeon: [name], MD;  Location: [facility] ENDOSCOPY;  Service: Endoscopy;  Laterality: N/A;    Allergies  Allergen Reactions  . Alendronate Sodium Other (See Comments)    REACTION: chest pain  . Antihistamines, Chlorpheniramine-Type Other (See Comments)    REACTION: "makes lungs bleed"  . Benadryl [Diphenhydramine] Other (See Comments)    REACTION: "makes lungs bleed"  . Nsaids Other (See Comments)    REACTION: ischemic colitis  . Other Other (See Comments)    All anti-inflammatories.  REACTION: ischemic colitis  . Pseudoephedrine Other (See Comments)    REACTION: Ischemic colitis  . Robitussin A-C [Guaifenesin-Codeine] Itching and Other (See Comments)    Palms itching  . Levofloxacin Anxiety    History   Social History  . Marital Status: Single    Spouse Name: N/A   . Number of Children: 0  . Years of Education: N/A   Occupational History  . 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 [place]   Social History Main Topics  . Smoking status: Current Every Day Smoker -- 0.40 packs/day for 38 years    Types: Cigarettes  . Smokeless tobacco: Never Used     Comment: "I don't inhale"  . Alcohol Use: 4.2 oz/week    7 Glasses of wine per week  . Drug Use: No  . Sexual Activity: Not on file   Other Topics Concern  . Not on file   Social History Narrative  sister lives w/ pt    Lost mom 12-2013    Family History  Problem Relation Age of Onset  . Colon cancer Neg Hx   . Breast cancer Neg Hx   . Esophageal cancer Neg Hx   . Rectal cancer Neg Hx   . Stomach cancer Neg Hx   . Diabetes Mother     GM  . Heart disease Mother     in her 27s  . Heart disease Maternal Grandfather   . Cancer Father     sarcoma  . Diabetes Other     multiple    BP 111/78 mmHg  Pulse 86  Ht 5\' 7"  (1.702 m)  Wt 118 lb (53.524 kg)  BMI 18.48 kg/m2  LMP 11/05/2004  Review of Systems: See HPI above.    Objective:  Physical Exam:  Gen: NAD  Right shoulder: No swelling, ecchymoses.  No gross deformity. Minimal anterior tenderness. FROM without painful arc. Negative Hawkins, Neers. Negative Speeds, Yergasons. Strength 5/5 with empty can and resisted internal/external rotation.  No pain. Negative positive apprehension. Mildly positive sulcus.  Back: No gross deformity, scoliosis.  Decreased muscle definition throughout. TTP lateral right latissimus, rib cage in posterior axillary line up to upper arm.  No midline or bony TTP. FROM. Strength 5/5 all muscle groups Sensation intact to light touch bilaterally.    Assessment & Plan:  1. Right shoulder injury - consistent with recurrent shoulder subluxation and instability based on history and exam.  Much improved with physical therapy and home exercises.  Encouraged to continue with therapy and  transition to home exercise program.  F/u 6 weeks or prn.   2. Upper back pain - do not think this is related to prior chest tube placement.  Recommended she work on strengthening of involved muscle groups, focus on posture.

## 2015-04-01 ENCOUNTER — Ambulatory Visit: Payer: 59 | Admitting: Physical Therapy

## 2015-04-01 ENCOUNTER — Encounter: Payer: Self-pay | Admitting: Physical Therapy

## 2015-04-01 DIAGNOSIS — R29898 Other symptoms and signs involving the musculoskeletal system: Secondary | ICD-10-CM

## 2015-04-01 DIAGNOSIS — R293 Abnormal posture: Secondary | ICD-10-CM

## 2015-04-01 DIAGNOSIS — M25511 Pain in right shoulder: Secondary | ICD-10-CM | POA: Diagnosis not present

## 2015-04-01 NOTE — Therapy (Signed)
[place], [place], [address] Phone: [phone]   Fax:  [phone]  Physical Therapy Treatment  Patient Details  Name: [name] [name] MRN: [id number] Date of Birth: [birth year] Referring Provider:  Colon Branch, MD  Encounter Date: 04/01/2015      PT End of Session - 04/01/15 0838    Visit Number 12   Number of Visits 16   Date for PT Re-Evaluation 05/15/15   PT Start Time 0730   PT Stop Time 0810   PT Time Calculation (min) 40 min      Past Medical History  Diagnosis Date  . COPD (chronic obstructive pulmonary disease)     per CT 11/10/05, will minimal bronchiectasis rll  . GERD (gastroesophageal reflux disease)   . Osteoporosis     dexa per gyn  . 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 Headache(784.0)   . Lichen planus   . B12 deficiency   . Endometriosis   . Cataracts, bilateral   . Arthritis   . Asthma     .  years ago- has not had problem for years.  . Hemorrhoid   . Pneumothorax, spontaneous, tension     x 2 in her 20's.  has a chest tube for one  . 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 Bronchiectasis     Past Surgical History  Procedure Laterality Date  . Knee surgery      .not replacement .x8  . Ovarian cyst removed    . Video bronchoscopy  02/15/2012    Procedure: VIDEO BRONCHOSCOPY WITH FLUORO;  Surgeon: [name], MD;  Location: [facility];  Service: Endoscopy;  Laterality: Bilateral;  WASHING- INTERVENTION BIOPSIES INTERVENTION BRONCHOSCOPY WITH VIDEO  . Mediastinoscopy  05/25/2012    Procedure: MEDIASTINOSCOPY;  Surgeon: [name], MD;  Location: [facility] OR;  Service: Thoracic;  Laterality: N/A;  . Tonsillectomy    . Flexible bronchoscopy  11/05/2012    Procedure: FLEXIBLE BRONCHOSCOPY;  Surgeon: [name], MD;  Location: [place];  Service: Thoracic;  Laterality: N/A;  . Thoracotomy  11/05/2012   Procedure: THORACOTOMY MAJOR;  Surgeon: [name], MD;  Location: [place];  Service: Thoracic;  Laterality: Right;  . Lobectomy  11/05/2012    Procedure: LOBECTOMY;  Surgeon: [name], MD;  Location: [facility] OR;  Service: Thoracic;  Laterality: Right;  right upper lobectomy  . Esophagogastroduodenoscopy  12/03/2012    Procedure: ESOPHAGOGASTRODUODENOSCOPY (EGD);  Surgeon: [name], MD;  Location: [facility] ENDOSCOPY;  Service: Endoscopy;  Laterality: N/A;    There were no vitals filed for this visit.  Visit Diagnosis:  Right shoulder pain  Weakness of right upper extremity  Abnormal posture      Subjective Assessment - 04/01/15 0802    Subjective 3/10 right shoulder and 6/10 in right ribs                         [facility] Adult PT Treatment/Exercise - 04/01/15 0840    Lumbar Exercises: Supine   Other Supine Lumbar Exercises alternating oblique isometrics x 10 with 5 sec hold (knee to opposite hand)   Shoulder Exercises: Seated   Horizontal ABduction 15 reps;Theraband   Theraband Level (Shoulder Horizontal ABduction) Level 2 (Red)   Shoulder Exercises: Prone   Retraction Both;10 reps   Other Prone Exercises chest/head lift from table  x10 attempted with scap squeeze, caused cramp.   tried in Spiro T per pt request, but caused cramp as well   Shoulder Exercises: Standing   Other Standing Exercises Lat stretch in sidelying and standing at doorway x 1 minute each    Shoulder Exercises: ROM/Strengthening   UBE (Upper Arm Bike) 5 min in reverse level 1   Other ROM/Strengthening Exercises Lat pull down 1 plate x 10 wide, x 10 extra wide                  PT Short Term Goals - 03/16/15 0906    PT SHORT TERM GOAL #1   Title (p) independent with HEP (02/23/15)   Status (p) Achieved   PT SHORT TERM GOAL #2   Title (p) perform R shoulder ROM without end range pain (02/22/14)   Status (p) Achieved   PT SHORT TERM GOAL #3   Title (p) report 30% improvement in ADLs  without increase in pain (02/22/14)   Status (p) Achieved           PT Long Term Goals - 03/23/15 1627    PT LONG TERM GOAL #1   Title independent with advanced HEP (03/23/15)   Time 8   Period Weeks   Status On-going   PT LONG TERM GOAL #2   Title verbalize understanding of posture/body mechanics to reduce risk of reinjury (03/23/15)   Time 8   Period Weeks   Status Achieved   PT LONG TERM GOAL #3   Title improve R shoulder strength to at least 4/5 all motions for improved strength and function (03/23/15)   Time 8   Period Weeks   Status Achieved   PT LONG TERM GOAL #4   Title report not increase in pain with ADLs and no need for compensatory strategies (03/23/15)   Time 8   Period Weeks   Status On-going   PT LONG TERM GOAL #5   Title Pt will be able to lift a 10 cup coffee pot with Rt. UE and report no lasting pain.    Time 8   Period Weeks   Status On-going               Plan - 04/01/15 0813    Clinical Impression Statement Pt reports an increase in spasms in right side causing a 6/10 pain and is interferring with her ability to perform HEP. Her MD recommends working on back extensions, side crunches, oblique and Latissiumus strengthening and posture.    PT Next Visit Plan cont with scapular stab ex, ensure proper posture with shoulder girdle strength.  May try prone reformer ex? continue above recommendations from MD. Check goals rom [place]        Problem List Patient Active Problem List   Diagnosis Date Noted  . Upper back pain 03/31/2015  . Right shoulder injury 01/06/2015  . Prediabetes 04/02/2014  . Avulsion fracture of trochanter of femur 10/17/2013  . Shoulder dislocation 10/17/2013  . Anxiety 10/17/2013  . Nausea alone 03/05/2013  . Other dysphagia 12/03/2012  . Odynophagia 11/28/2012  . 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 S/P lobectomy of lung 11/18/2012  . Weight loss 12/13/2011  . Annual physical exam 06/23/2011  .  INSOMNIA-SLEEP DISORDER-UNSPEC 11/22/2010  . PALPITATIONS 02/27/2009  . CIGARETTE SMOKER 12/24/2007  . BRONCHIECTASIS 12/24/2007  . ISCHEMIC COLITIS 12/24/2007  . HEPATIC CYST 12/24/2007  . HEADACHES, HX OF 12/24/2007  . B12 DEFICIENCY 12/30/2006  . ASTHMA 12/30/2006  .  COPD GOLD 0  12/30/2006  . GERD 12/30/2006  . ENDOMETRIOSIS NOS 12/30/2006  . LICHEN PLANUS 80/88/1103  . OSTEOARTHRITIS 12/30/2006  . OSTEOPOROSIS 12/30/2006    Dorene Ar , PTA  04/01/2015, 8:44 AM  [place], [place], [address] Phone: [phone]   Fax:  [phone]

## 2015-04-06 ENCOUNTER — Telehealth: Payer: Self-pay | Admitting: *Deleted

## 2015-04-06 ENCOUNTER — Ambulatory Visit: Payer: 59 | Attending: Family Medicine | Admitting: Physical Therapy

## 2015-04-06 ENCOUNTER — Encounter: Payer: Self-pay | Admitting: *Deleted

## 2015-04-06 DIAGNOSIS — R29898 Other symptoms and signs involving the musculoskeletal system: Secondary | ICD-10-CM | POA: Diagnosis not present

## 2015-04-06 DIAGNOSIS — M25511 Pain in right shoulder: Secondary | ICD-10-CM | POA: Diagnosis not present

## 2015-04-06 DIAGNOSIS — R293 Abnormal posture: Secondary | ICD-10-CM

## 2015-04-06 NOTE — Telephone Encounter (Signed)
Pre-Visit Call completed with patient and chart updated.   Pre-Visit Info documented in Specialty Comments under SnapShot.    

## 2015-04-06 NOTE — Patient Instructions (Signed)
ADDUCTION: Supine (Active)  USE YELLOW BAND Lie on back, right arm out to side and above head. Draw arm in to body, keeping elbow straight. Use __0_ lbs. Complete _2__ sets of _10__ repetitions. Perform __2_ sessions per day.  Copyright  VHI. All rights reserved.

## 2015-04-06 NOTE — Therapy (Signed)
[place], [place], [address] Phone: [phone]   Fax:  [phone]  Physical Therapy Treatment  Patient Details  Name: [name] [name] MRN: [id number] Date of Birth: 07-Jul-1956 Referring Provider:  Colon Branch, MD  Encounter Date: 04/06/2015      PT End of Session - 04/06/15 1716    Visit Number 13   Number of Visits 16   Date for PT Re-Evaluation 05/15/15   PT Start Time 0428   PT Stop Time 0512   PT Time Calculation (min) 44 min      Past Medical History  Diagnosis Date  . COPD (chronic obstructive pulmonary disease)     per CT 11/10/05, will minimal bronchiectasis rll  . GERD (gastroesophageal reflux disease)   . Osteoporosis     dexa per gyn  . 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 Headache(784.0)   . Lichen planus   . B12 deficiency   . Endometriosis   . Cataracts, bilateral   . Arthritis   . Asthma     .  years ago- has not had problem for years.  . Hemorrhoid   . Pneumothorax, spontaneous, tension     x 2 in her 20's.  has a chest tube for one  . 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 Bronchiectasis     Past Surgical History  Procedure Laterality Date  . Knee surgery      .not replacement .x8  . Ovarian cyst removed    . Video bronchoscopy  02/15/2012    Procedure: VIDEO BRONCHOSCOPY WITH FLUORO;  Surgeon: [name], MD;  [facility]: [facility];  Service: Endoscopy;  Laterality: Bilateral;  WASHING- INTERVENTION BIOPSIES INTERVENTION BRONCHOSCOPY WITH VIDEO  . Mediastinoscopy  05/25/2012    Procedure: MEDIASTINOSCOPY;  Surgeon: [name], MD;  Location: [facility] OR;  Service: Thoracic;  Laterality: N/A;  . Tonsillectomy    . Flexible bronchoscopy  11/05/2012    Procedure: FLEXIBLE BRONCHOSCOPY;  Surgeon: [name], MD;  Location: [place];  Service: Thoracic;  Laterality: N/A;  . Thoracotomy  11/05/2012   Procedure: THORACOTOMY MAJOR;  Surgeon: [name], MD;  Location: [place];  Service: Thoracic;  Laterality: Right;  . Lobectomy  11/05/2012    Procedure: LOBECTOMY;  Surgeon: [name], MD;  Location: [facility] OR;  Service: Thoracic;  Laterality: Right;  right upper lobectomy  . Esophagogastroduodenoscopy  12/03/2012    Procedure: ESOPHAGOGASTRODUODENOSCOPY (EGD);  Surgeon: [name], MD;  Location: [facility] ENDOSCOPY;  Service: Endoscopy;  Laterality: N/A;    There were no vitals filed for this visit.  Visit Diagnosis:  Right shoulder pain  Weakness of right upper extremity  Abnormal posture      Subjective Assessment - 04/06/15 1633    Subjective Less rib spasms.    Currently in Pain? No/denies                         [facility] Adult PT Treatment/Exercise - 04/06/15 0001    Lumbar Exercises: Supine   Other Supine Lumbar Exercises alternating oblique isometrics x 10 with 5 sec hold (knee to opposite hand)   Shoulder Exercises: Supine   Other Supine Exercises Reformer exercises: see progress note,   Shoulder Exercises: Standing   Other Standing Exercises Lat stretch in sidelying and standing at doorway x 1 minute each  Other Standing Exercises adduction with yellow band standing x 20   Shoulder Exercises: ROM/Strengthening   UBE (Upper Arm Bike) 5 min in reverse level 1   Other ROM/Strengthening Exercises Lat pull down 1 plate x 10 wide, x 10 extra wide  seated in chair at lat pull down      Pilates Reformer used for LE/core strength, postural strength, lumbopelvic disassociation and core control.  Exercises included:  Long box Prone; pulling straps for extension and adduction,attempted circles, fatigued quickly. Cues for neutral spine, abdominal draw ins and glute activation.   Pilates Spring board: black straps and yellow straps ring 7 for shoulder extension and circles, cues for core and neutral. Pillow for cervical alignment.             PT  Education - 04/06/15 1719    Education provided Yes   Education Details shoulder adduction with yellow band supine   Person(s) Educated Patient   Methods Explanation;Handout   Comprehension Verbalized understanding          PT Short Term Goals - 03/16/15 0906    PT SHORT TERM GOAL #1   Title (p) independent with HEP (02/23/15)   Status (p) Achieved   PT SHORT TERM GOAL #2   Title (p) perform R shoulder ROM without end range pain (02/22/14)   Status (p) Achieved   PT SHORT TERM GOAL #3   Title (p) report 30% improvement in ADLs without increase in pain (02/22/14)   Status (p) Achieved           PT Long Term Goals - 03/23/15 1627    PT LONG TERM GOAL #1   Title independent with advanced HEP (03/23/15)   Time 8   Period Weeks   Status On-going   PT LONG TERM GOAL #2   Title verbalize understanding of posture/body mechanics to reduce risk of reinjury (03/23/15)   Time 8   Period Weeks   Status Achieved   PT LONG TERM GOAL #3   Title improve R shoulder strength to at least 4/5 all motions for improved strength and function (03/23/15)   Time 8   Period Weeks   Status Achieved   PT LONG TERM GOAL #4   Title report not increase in pain with ADLs and no need for compensatory strategies (03/23/15)   Time 8   Period Weeks   Status On-going   PT LONG TERM GOAL #5   Title Pt will be able to lift a 10 cup coffee pot with Rt. UE and report no lasting pain.    Time 8   Period Weeks   Status On-going               Plan - 04/06/15 1634    Clinical Impression Statement Pt reports decreased muscle spasms since last visit and no shoulder pain. She reports no longer having pain with reaching across body.    PT Next Visit Plan cont with scapular stab ex, ensure proper posture with shoulder girdle strength.  continue prone reformer and supine arms or spring board; Check goals rom [place]        Problem List Patient Active Problem List   Diagnosis Date Noted  . Upper back  pain 03/31/2015  . Right shoulder injury 01/06/2015  . Prediabetes 04/02/2014  . Avulsion fracture of trochanter of femur 10/17/2013  . Shoulder dislocation 10/17/2013  . Anxiety 10/17/2013  . Nausea alone 03/05/2013  . Other dysphagia 12/03/2012  . Odynophagia 11/28/2012  .  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 S/P lobectomy of lung 11/18/2012  . Weight loss 12/13/2011  . Annual physical exam 06/23/2011  . INSOMNIA-SLEEP DISORDER-UNSPEC 11/22/2010  . PALPITATIONS 02/27/2009  . CIGARETTE SMOKER 12/24/2007  . BRONCHIECTASIS 12/24/2007  . ISCHEMIC COLITIS 12/24/2007  . HEPATIC CYST 12/24/2007  . HEADACHES, HX OF 12/24/2007  . B12 DEFICIENCY 12/30/2006  . ASTHMA 12/30/2006  . COPD GOLD 0  12/30/2006  . GERD 12/30/2006  . ENDOMETRIOSIS NOS 12/30/2006  . LICHEN PLANUS 14/38/8875  . OSTEOARTHRITIS 12/30/2006  . OSTEOPOROSIS 12/30/2006    Dorene Ar, PTA 04/06/2015, 5:20 PM  [facility] [address] [place], [place], [address] Phone: [phone]   Fax:  [phone]

## 2015-04-07 ENCOUNTER — Encounter: Payer: Self-pay | Admitting: Internal Medicine

## 2015-04-07 ENCOUNTER — Other Ambulatory Visit: Payer: Self-pay | Admitting: Internal Medicine

## 2015-04-07 ENCOUNTER — Ambulatory Visit (INDEPENDENT_AMBULATORY_CARE_PROVIDER_SITE_OTHER): Payer: 59 | Admitting: Internal Medicine

## 2015-04-07 VITALS — BP 116/66 | HR 82 | Temp 98.1°F | Ht 67.0 in | Wt 118.4 lb

## 2015-04-07 DIAGNOSIS — M810 Age-related osteoporosis without current pathological fracture: Secondary | ICD-10-CM

## 2015-04-07 DIAGNOSIS — Z0000 Encounter for general adult medical examination without abnormal findings: Secondary | ICD-10-CM

## 2015-04-07 DIAGNOSIS — E559 Vitamin D deficiency, unspecified: Secondary | ICD-10-CM

## 2015-04-07 LAB — LIPID PANEL
Cholesterol: 204 mg/dL — ABNORMAL HIGH (ref 0–200)
HDL: 83.2 mg/dL (ref >39.00)
LDL Cholesterol: 109 mg/dL — ABNORMAL HIGH (ref 0–99)
NonHDL: 120.8
Total CHOL/HDL Ratio: 2
Triglycerides: 59 mg/dL (ref 0.0–149.0)
VLDL: 11.8 mg/dL (ref 0.0–40.0)

## 2015-04-07 NOTE — Progress Notes (Signed)
Pre visit review using our clinic review tool, if applicable. No additional management support is needed unless otherwise documented below in the visit note. 

## 2015-04-07 NOTE — Patient Instructions (Addendum)
Get your blood work before you leave  Also a UDS   Come back to the office in 3 months for a routine check up

## 2015-04-07 NOTE — Assessment & Plan Note (Addendum)
Td 2015 pneumonia shot 07, 2013  prevnar - 2015     Cscope 4-09 , 1 polyp (@ [facility])  cscope 09-2014, + polyps, next per GI  No recent PAP-MMGs, refer to  Gyn (female provider)  Diet, exercise, tobacco quitting discussed Labs-- FLP, vitamin D   Other issues: Right-sided chest pain, nausea: seen recently here by Dr. Etter Sjogren, ultrasound of the abdomen negative. Symptoms resolved  Vit D def, on po suplements, labs  Bronchiectasis, sees Dr. Olena Heckle in [facility], symptoms stable.  DJD, multiple orthopedic problems, currently at baseline, undergoing physical therapy and the care of Dr Barbaraann Barthel, sports medicine

## 2015-04-07 NOTE — Progress Notes (Signed)
Subjective:    Patient ID: [name] [name], female    DOB: [birth year], 59 y.o.   MRN: [id number]  DOS:  04/07/2015 Type of visit - description : cpx Interval history: No major concerns today, see review of systems    Review of Systems Constitutional:  last week had mild sore throat and low-grade fever, since then symptoms resolved.  No unexplained wt changes. No unusual sweats HEENT: No dental problems, ear discharge, facial swelling, voice changes. No eye discharge, redness or intolerance to light Respiratory:   Chronic respiratory symptoms at baseline including shortness of breath and occasional cough. Also from time to time sees small amounts of blood in the sputum. Not a new symptom. Was recently seen with right sided chest pain, ultrasound was negative, pain better.  Cardiovascular: No CP, leg swelling or palpitations GI: no nausea, vomiting, diarrhea or abdominal pain.  No blood in the stools. No dysphagia   Endocrine: No polyphagia, polyuria or polydipsia GU: No dysuria, , difficulty urinating. No urinary urgency or frequency.   last week, saw  3 spots of apparently blood on her underwear, not sure if it was from the urinary tract or vagina.   Musculoskeletal: No joint swellings or unusual aches or pains (pains at baseline)  Skin: No change in the color of the skin, palor or rash Allergic, immunologic: No environmental allergies or food allergies Neurological: No dizziness or syncope. No headaches. No diplopia, slurred speech, motor deficits, facial numbness Hematological: No enlarged lymph nodes, easy bruising or bleeding Psychiatry: No suicidal ideas, hallucinations, behavior problems or confusion. No unusual/severe anxiety or depression.     Past Medical History  Diagnosis Date  . COPD (chronic obstructive pulmonary disease)     per CT 11/10/05, will minimal bronchiectasis rll  . GERD (gastroesophageal reflux disease)   . Osteoporosis     dexa per gyn  . Colitis    induced by decongestants, NSAIds  . Headache(784.0)   . Lichen planus   . B12 deficiency   . Endometriosis   . Cataracts, bilateral   . Arthritis   . Asthma     .  years ago- has not had problem for years.  . Hemorrhoid   . Pneumothorax, spontaneous, tension     x 2 in her 20's.  has a chest tube for one  . 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 Bronchiectasis   . Shoulder dislocation 2015     R shoulder , x 2     Past Surgical History  Procedure Laterality Date  . Knee surgery      .not replacement .x8  . Ovarian cyst removed    . Video bronchoscopy  02/15/2012    Procedure: VIDEO BRONCHOSCOPY WITH FLUORO;  Surgeon: [name], MD;  Location: [facility];  Service: Endoscopy;  Laterality: Bilateral;  WASHING- INTERVENTION BIOPSIES INTERVENTION BRONCHOSCOPY WITH VIDEO  . Mediastinoscopy  05/25/2012    Procedure: MEDIASTINOSCOPY;  Surgeon: [name], MD;  Location: [facility] OR;  Service: Thoracic;  Laterality: N/A;  . Tonsillectomy    . Flexible bronchoscopy  11/05/2012    Procedure: FLEXIBLE BRONCHOSCOPY;  Surgeon: [name], MD;  Location: [place];  Service: Thoracic;  Laterality: N/A;  . Thoracotomy  11/05/2012    Procedure: THORACOTOMY MAJOR;  Surgeon: [name], MD;  Location: [place];  Service: Thoracic;  Laterality: Right;  . Lobectomy  11/05/2012    Procedure: LOBECTOMY;  Surgeon:  [name], MD;  Location: [facility] OR;  Service: Thoracic;  Laterality: Right;  right upper lobectomy  . Esophagogastroduodenoscopy  12/03/2012    Procedure: ESOPHAGOGASTRODUODENOSCOPY (EGD);  Surgeon: [name], MD;  Location: [facility] ENDOSCOPY;  Service: Endoscopy;  Laterality: N/A;    History   Social History  . Marital Status: Single    Spouse Name: N/A  . Number of Children: 0  . Years of Education: N/A   Occupational History  . 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 [employer]   Social History Main Topics  .  Smoking status: Current Every Day Smoker -- 0.40 packs/day for 38 years    Types: Cigarettes  . Smokeless tobacco: Never Used     Comment: < 1 ppd   . Alcohol Use: 4.2 oz/week    7 Glasses of wine per week  . Drug Use: No  . Sexual Activity: Not on file   Other Topics Concern  . Not on file   Social History Narrative   sister lives w/ pt    Lost mom 12-2013        Medication List       This list is accurate as of: 04/07/15  4:48 PM.  Always use your most recent med list.               acetaminophen 500 MG tablet  Commonly known as:  TYLENOL  Take 500 mg by mouth every 4 (four) hours as needed for moderate pain or fever. For fever     ALPRAZolam 0.5 MG tablet  Commonly known as:  XANAX  TAKE 1/2 TO 1 TABLET BY MOUTH AT BEDTIME AS NEEDED FOR INSOMNIA     aspirin 81 MG chewable tablet  Chew 81 mg by mouth daily.     beclomethasone 80 MCG/ACT inhaler  Commonly known as:  QVAR  Inhale 2 puffs into the lungs 2 (two) times daily.     Biotin 1000 MCG tablet  Take 1,000 mcg by mouth 3 (three) times daily.     HYDROcodone-acetaminophen 10-325 MG per tablet  Commonly known as:  NORCO  Take 1 tablet by mouth every 6 (six) hours as needed.     LYSINE PO  Take 1 tablet by mouth daily.     methocarbamol 500 MG tablet  Commonly known as:  ROBAXIN  Take 1 tablet (500 mg total) by mouth every 8 (eight) hours as needed for muscle spasms.     NICORETTE 4 MG lozenge  Generic drug:  nicotine polacrilex  Place 4 mg inside cheek every 3 (three) hours as needed for smoking cessation. For nicotine cravings.     nicotine 10 MG inhaler  Commonly known as:  NICOTROL  Inhale 10 mg into the lungs as needed.     pantoprazole 40 MG tablet  Commonly known as:  PROTONIX  TAKE 1 TABLET BY MOUTH TWICE DAILY BEFORE A MEAL     tiotropium 18 MCG inhalation capsule  Commonly known as:  [place] into inhaler and inhale.     VITAMIN D PO  Take 1 tablet by mouth daily.        Family History  Problem Relation Age of Onset  . Colon cancer Neg Hx   . Breast cancer Neg Hx   . Esophageal cancer Neg Hx   . Rectal cancer Neg Hx   . Stomach cancer Neg Hx   . Diabetes Mother  GM  . Heart disease Mother     in her 22s  . Heart disease Maternal Grandfather   . Cancer Father     sarcoma  . Diabetes Other     multiple       Objective:   Physical Exam BP 116/66 mmHg  Pulse 82  Temp(Src) 98.1 F (36.7 C) (Oral)  Ht 5\' 7"  (1.702 m)  Wt 118 lb 6 oz (53.695 kg)  BMI 18.54 kg/m2  SpO2 97%  LMP 11/05/2004  General:   Well developed, well nourished . NAD.  Neck:  Full range of motion. Supple. No  thyromegaly   HEENT:  Normocephalic . Face symmetric, atraumatic Lungs:  CTA B Normal respiratory effort, no intercostal retractions, no accessory muscle use. Heart: RRR,  no murmur.  Abdomen:  Not distended, soft, non-tender. No rebound or rigidity. No mass,organomegaly Muscle skeletal: no pretibial edema bilaterally  Skin: Exposed areas without rash. Not pale. Not jaundice Neurologic:  alert & oriented X3.  Speech normal, gait unassisted  Psych: Cognition and judgment appear intact.  Cooperative with normal attention span and concentration.  Behavior appropriate. No anxious or depressed appearing.       Assessment & Plan:

## 2015-04-07 NOTE — Assessment & Plan Note (Signed)
Osteoporosis, last bone density test 04/2014, T score -4.0, we discussed prolia , it was approved by her insurance but she decided not to take it. Risk of osteoporosis including fractures with severe sequela discussed, strongly recommend treatment with Prolia,  patient will think about it

## 2015-04-11 LAB — VITAMIN D 1,25 DIHYDROXY
Vitamin D 1, 25 (OH)2 Total: 44 pg/mL (ref 18–72)
Vitamin D3 1, 25 (OH)2: 44 pg/mL

## 2015-04-13 ENCOUNTER — Ambulatory Visit: Payer: 59 | Admitting: Physical Therapy

## 2015-04-13 DIAGNOSIS — R293 Abnormal posture: Secondary | ICD-10-CM

## 2015-04-13 DIAGNOSIS — M25511 Pain in right shoulder: Secondary | ICD-10-CM

## 2015-04-13 DIAGNOSIS — R29898 Other symptoms and signs involving the musculoskeletal system: Secondary | ICD-10-CM

## 2015-04-13 NOTE — Therapy (Signed)
[place], [place], [address] Phone: [phone]   Fax:  [phone]  Physical Therapy Treatment  Patient Details  Name: [name] [name] MRN: [id number] Date of Birth: February 05, 1956 Referring Provider:  Colon Branch, MD  Encounter Date: 04/13/2015      PT End of Session - 04/13/15 0845    Visit Number 14   Number of Visits 16   PT Start Time 0800   PT Stop Time 2505   PT Time Calculation (min) 44 min   Activity Tolerance Patient tolerated treatment well      Past Medical History  Diagnosis Date  . COPD (chronic obstructive pulmonary disease)     per CT 11/10/05, will minimal bronchiectasis rll  . GERD (gastroesophageal reflux disease)   . Osteoporosis     dexa per gyn  . 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 Headache(784.0)   . Lichen planus   . B12 deficiency   . Endometriosis   . Cataracts, bilateral   . Arthritis   . Asthma     .  years ago- has not had problem for years.  . Hemorrhoid   . Pneumothorax, spontaneous, tension     x 2 in her 20's.  has a chest tube for one  . 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 Bronchiectasis   . Shoulder dislocation 2015     R shoulder , x 2     Past Surgical History  Procedure Laterality Date  . Knee surgery      .not replacement .x8  . Ovarian cyst removed    . Video bronchoscopy  02/15/2012    Procedure: VIDEO BRONCHOSCOPY WITH FLUORO;  Surgeon: [name], MD;  Location: [facility];  Service: Endoscopy;  Laterality: Bilateral;  WASHING- INTERVENTION BIOPSIES INTERVENTION BRONCHOSCOPY WITH VIDEO  . Mediastinoscopy  05/25/2012    Procedure: MEDIASTINOSCOPY;  Surgeon: [name], MD;  Location: [facility] OR;  Service: Thoracic;  Laterality: N/A;  . Tonsillectomy    . Flexible bronchoscopy  11/05/2012    Procedure: FLEXIBLE BRONCHOSCOPY;  Surgeon: [name], MD;  Location: [place];   Service: Thoracic;  Laterality: N/A;  . Thoracotomy  11/05/2012    Procedure: THORACOTOMY MAJOR;  Surgeon: [name], MD;  Location: [place];  Service: Thoracic;  Laterality: Right;  . Lobectomy  11/05/2012    Procedure: LOBECTOMY;  Surgeon: [name], MD;  Location: [facility] OR;  Service: Thoracic;  Laterality: Right;  right upper lobectomy  . Esophagogastroduodenoscopy  12/03/2012    Procedure: ESOPHAGOGASTRODUODENOSCOPY (EGD);  Surgeon: [name], MD;  Location: [facility] ENDOSCOPY;  Service: Endoscopy;  Laterality: N/A;    There were no vitals filed for this visit.  Visit Diagnosis:  Abnormal posture  Weakness of right upper extremity  Right shoulder pain      Subjective Assessment - 04/13/15 0807    Subjective Worked in the garden Sat, min soreness.  NO muscle spasms, though!   Currently in Pain? Yes   Pain Score 3    Pain Location Shoulder   Pain Orientation Proximal;Right;Other (Comment)  beneath armpit   Pain Descriptors / Indicators Sore   Pain Type Chronic pain   Pain Onset More than a month ago   Pain Frequency Intermittent  [facility] Adult PT Treatment/Exercise - 04/13/15 0832    Exercises   Exercises --  NuStep level 6 UE and LE        Pilates Reformer used for LE/core strength, postural strength, lumbopelvic disassociation and core control.  Exercises included:  Supine Arm work 1 Blue Arm arcs (Parallel, T) and circles Long box Prone 1 blue,mod to yellow pulling straps, T press  Seated Arms side arms done bilaterally with yellow spring:  ER, flexion and diagonal pull (seated on long box)  Cueing needed for neck alignment and thoracic extension. In supine, unable to hold table top as well and maintain scapular neutral, rib cage flares.   Stretching: demo'd HEP and provided sidelying scapular mobilization < 5 min       PT Education - 04/13/15 0845    Education provided Yes   Education Details stretching, PIlates    Person(s) Educated Patient   Methods Explanation;Demonstration   Comprehension Verbalized understanding;Returned demonstration          PT Short Term Goals - 03/16/15 0906    PT SHORT TERM GOAL #1   Title (p) independent with HEP (02/23/15)   Status (p) Achieved   PT SHORT TERM GOAL #2   Title (p) perform R shoulder ROM without end range pain (02/22/14)   Status (p) Achieved   PT SHORT TERM GOAL #3   Title (p) report 30% improvement in ADLs without increase in pain (02/22/14)   Status (p) Achieved           PT Long Term Goals - 04/13/15 7035    PT LONG TERM GOAL #1   Title independent with advanced HEP (03/23/15)   Status On-going   PT LONG TERM GOAL #2   Title verbalize understanding of posture/body mechanics to reduce risk of reinjury (03/23/15)   Status On-going   PT LONG TERM GOAL #3   Title improve R shoulder strength to at least 4/5 all motions for improved strength and function (03/23/15)   Status Achieved   PT LONG TERM GOAL #4   Title report not increase in pain with ADLs and no need for compensatory strategies (03/23/15)   Status Partially Met   PT LONG TERM GOAL #5   Title Pt will be able to lift a 10 cup coffee pot with Rt. UE and report no lasting pain.    Status Partially Met               Plan - 04/13/15 0845    Clinical Impression Statement Pt states she hasnt had muscle spasm in over a week.  Can almost lift full coffee pot without lasting pain. Cont 2 more visits to ensure progress and consistency.    PT Next Visit Plan cont with scapular stab ex, ensure proper posture with shoulder girdle strength.  continue prone reformer and supine arms or spring board; Check goals rom [place] with previous   Consulted and Agree with Plan of Care Patient        Problem List Patient Active Problem List   Diagnosis Date Noted  . Upper back pain 03/31/2015  . Right shoulder injury 01/06/2015  . Prediabetes 04/02/2014  .  Avulsion fracture of trochanter of femur 10/17/2013  . Shoulder dislocation 10/17/2013  . Anxiety 10/17/2013  . Nausea alone 03/05/2013  . Other dysphagia 12/03/2012  . Odynophagia 11/28/2012  . 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 S/P lobectomy of lung 11/18/2012  . Weight loss 12/13/2011  .  Annual physical exam 06/23/2011  . INSOMNIA-SLEEP DISORDER-UNSPEC 11/22/2010  . PALPITATIONS 02/27/2009  . CIGARETTE SMOKER 12/24/2007  . BRONCHIECTASIS 12/24/2007  . ISCHEMIC COLITIS 12/24/2007  . HEPATIC CYST 12/24/2007  . HEADACHES, HX OF 12/24/2007  . B12 DEFICIENCY 12/30/2006  . ASTHMA 12/30/2006  . COPD GOLD 0  12/30/2006  . GERD 12/30/2006  . ENDOMETRIOSIS NOS 12/30/2006  . LICHEN PLANUS 65/79/0383  . OSTEOARTHRITIS 12/30/2006  . Osteoporosis 12/30/2006    Euretha Najarro 04/13/2015, 9:31 AM  [facility] [address] [place], [place], [address] Phone: [phone]   Fax:  [phone]

## 2015-04-20 ENCOUNTER — Ambulatory Visit: Payer: 59 | Admitting: Physical Therapy

## 2015-04-20 ENCOUNTER — Telehealth: Payer: Self-pay

## 2015-04-20 DIAGNOSIS — M25511 Pain in right shoulder: Secondary | ICD-10-CM | POA: Diagnosis not present

## 2015-04-20 DIAGNOSIS — R29898 Other symptoms and signs involving the musculoskeletal system: Secondary | ICD-10-CM

## 2015-04-20 DIAGNOSIS — R293 Abnormal posture: Secondary | ICD-10-CM

## 2015-04-20 NOTE — Telephone Encounter (Signed)
UDS: 04/07/2015  Negative for Alprazolam Positive for Norco  Low risk per Dr. Larose Kells 04/20/2015

## 2015-04-20 NOTE — Therapy (Signed)
[facility] [address] [place], [place], [address] Phone: [phone]   Fax:  [phone]  Physical Therapy Treatment  Patient Details  Name: [name] [name] MRN: [id number] Date of Birth: 01/14/1956 Referring Provider:  Paz, Jose E, MD  Encounter Date: 04/20/2015      PT End of Session - 04/20/15 1633    Visit Number 15   Number of Visits 16   Date for PT Re-Evaluation 05/15/15   PT Start Time 0432   PT Stop Time 0510   PT Time Calculation (min) 38 min      Past Medical History  Diagnosis Date  . COPD (chronic obstructive pulmonary disease)     per CT 11/10/05, will minimal bronchiectasis rll  . GERD (gastroesophageal reflux disease)   . Osteoporosis     dexa per gyn  . 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 Headache(784.0)   . Lichen planus   . B12 deficiency   . Endometriosis   . Cataracts, bilateral   . Arthritis   . Asthma     .  years ago- has not had problem for years.  . Hemorrhoid   . Pneumothorax, spontaneous, tension     x 2 in her 20's.  has a chest tube for one  . 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 Bronchiectasis   . Shoulder dislocation 2015     R shoulder , x 2     Past Surgical History  Procedure Laterality Date  . Knee surgery      .not replacement .x8  . Ovarian cyst removed    . Video bronchoscopy  02/15/2012    Procedure: VIDEO BRONCHOSCOPY WITH FLUORO;  Surgeon: Michael B Wert, MD;  Location: [facility];  Service: Endoscopy;  Laterality: Bilateral;  WASHING- INTERVENTION BIOPSIES INTERVENTION BRONCHOSCOPY WITH VIDEO  . Mediastinoscopy  05/25/2012    Procedure: MEDIASTINOSCOPY;  Surgeon: Bryan K Bartle, MD;  Location: [facility] OR;  Service: Thoracic;  Laterality: N/A;  . Tonsillectomy    . Flexible bronchoscopy  11/05/2012    Procedure: FLEXIBLE BRONCHOSCOPY;  Surgeon: Bryan K Bartle, MD;  Location: [facility] OR;  Service:  Thoracic;  Laterality: N/A;  . Thoracotomy  11/05/2012    Procedure: THORACOTOMY MAJOR;  Surgeon: Bryan K Bartle, MD;  Location: [facility] OR;  Service: Thoracic;  Laterality: Right;  . Lobectomy  11/05/2012    Procedure: LOBECTOMY;  Surgeon: Bryan K Bartle, MD;  Location: [facility] OR;  Service: Thoracic;  Laterality: Right;  right upper lobectomy  . Esophagogastroduodenoscopy  12/03/2012    Procedure: ESOPHAGOGASTRODUODENOSCOPY (EGD);  Surgeon: Dora M Brodie, MD;  Location: [facility];  Service: Endoscopy;  Laterality: N/A;    There were no vitals filed for this visit.  Visit Diagnosis:  Abnormal posture  Weakness of right upper extremity  Right shoulder pain      Subjective Assessment - 04/20/15 1713    Subjective The spasms in my side are back again. I need more exercises.                          [facility] Adult PT Treatment/Exercise - 04/20/15 0001    Lumbar Exercises: Supine   Ab Set 5 reps   Other Supine Lumbar Exercises alternating oblique isometrics x 10 with 5 sec hold (knee to opposite hand), perfomed with orange ball on abdominals, also rolling ball up knees into   a crunch x10, supine lat pull down with yellow band  x10 then oblique pull down one arm to opposite knee x 10 each-issed band for home.    Shoulder Exercises: Prone   Other Prone Exercises chest/head lift from table x10   tried in Bent T per pt request, but caused cramp as well   Shoulder Exercises: ROM/Strengthening   Other ROM/Strengthening Exercises Lat pull down 1 plate x 10 wide, x 10 extra wide  seated in chair at lat pull down                PT Education - 04/20/15 1707    Education provided Yes   Education Details Supine lat pull down with theraband   Person(s) Educated Patient   Methods Explanation;Handout   Comprehension Verbalized understanding          PT Short Term Goals - 03/16/15 0906    PT SHORT TERM GOAL #1   Title (p) independent with HEP (02/23/15)   Status (p) Achieved    PT SHORT TERM GOAL #2   Title (p) perform R shoulder ROM without end range pain (02/22/14)   Status (p) Achieved   PT SHORT TERM GOAL #3   Title (p) report 30% improvement in ADLs without increase in pain (02/22/14)   Status (p) Achieved           PT Long Term Goals - 04/13/15 0817    PT LONG TERM GOAL #1   Title independent with advanced HEP (03/23/15)   Status On-going   PT LONG TERM GOAL #2   Title verbalize understanding of posture/body mechanics to reduce risk of reinjury (03/23/15)   Status On-going   PT LONG TERM GOAL #3   Title improve R shoulder strength to at least 4/5 all motions for improved strength and function (03/23/15)   Status Achieved   PT LONG TERM GOAL #4   Title report not increase in pain with ADLs and no need for compensatory strategies (03/23/15)   Status Partially Met   PT LONG TERM GOAL #5   Title Pt will be able to lift a 10 cup coffee pot with Rt. UE and report no lasting pain.    Status Partially Met               Plan - 04/20/15 1635    Clinical Impression Statement Pt reports spasms have returned and are bad. The right shoulder only hurts now with heavy lifting and pushing into wall for latissimus stretch. Pt reports increased hip pain after last session for 1 week.    PT Next Visit Plan discharge next visit, recheck ROM/ strength. review hep.  FOTO?        Problem List Patient Active Problem List   Diagnosis Date Noted  . Upper back pain 03/31/2015  . Right shoulder injury 01/06/2015  . Prediabetes 04/02/2014  . Avulsion fracture of trochanter of femur 10/17/2013  . Shoulder dislocation 10/17/2013  . Anxiety 10/17/2013  . Nausea alone 03/05/2013  . Other dysphagia 12/03/2012  . Odynophagia 11/28/2012  . 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 S/P lobectomy of lung 11/18/2012  . Weight loss 12/13/2011  . Annual physical exam 06/23/2011  . INSOMNIA-SLEEP DISORDER-UNSPEC 11/22/2010  . PALPITATIONS 02/27/2009  .  CIGARETTE SMOKER 12/24/2007  . BRONCHIECTASIS 12/24/2007  . ISCHEMIC COLITIS 12/24/2007  . HEPATIC CYST 12/24/2007  . HEADACHES, HX OF 12/24/2007  . B12 DEFICIENCY 12/30/2006  . ASTHMA 12/30/2006  . COPD GOLD 0  12/30/2006  .   GERD 12/30/2006  . ENDOMETRIOSIS NOS 12/30/2006  . LICHEN PLANUS 40/97/3532  . OSTEOARTHRITIS 12/30/2006  . Osteoporosis 12/30/2006    Dorene Ar, PTA 04/20/2015, 5:16 PM  [place], [place], [address] Phone: [phone]   Fax:  [phone]

## 2015-04-22 ENCOUNTER — Telehealth: Payer: Self-pay | Admitting: Internal Medicine

## 2015-04-22 MED ORDER — HYDROCODONE-ACETAMINOPHEN 10-325 MG PO TABS: 1.0000 | ORAL_TABLET | Freq: Four times a day (QID) | ORAL | Status: DC | PRN

## 2015-04-22 NOTE — Telephone Encounter (Signed)
Ok  #90, 2 prescriptions 

## 2015-04-22 NOTE — Telephone Encounter (Signed)
Caller name:Morghan Cochram Relationship to patient:self Can be reached:[phone] Pharmacy:  Reason for call:Requesting Rx on hydrocodone

## 2015-04-22 NOTE — Telephone Encounter (Signed)
LMOM informing Pt that Rx's are ready for pick up at front desk at her earliest convenience.

## 2015-04-22 NOTE — Telephone Encounter (Signed)
Pt is requesting refill on Hydrocodone.  Last OV: 04/07/2015 Last Fill: 03/16/2015 #90 0RF UDS: 04/07/2015 Low risk   Please advise.

## 2015-04-22 NOTE — Telephone Encounter (Signed)
Rx's printed for May and June 2016. Awaiting MD signature.

## 2015-04-28 ENCOUNTER — Other Ambulatory Visit: Payer: Self-pay | Admitting: Family Medicine

## 2015-04-28 ENCOUNTER — Ambulatory Visit: Payer: 59 | Admitting: Physical Therapy

## 2015-04-28 DIAGNOSIS — M25511 Pain in right shoulder: Secondary | ICD-10-CM | POA: Diagnosis not present

## 2015-04-28 DIAGNOSIS — R29898 Other symptoms and signs involving the musculoskeletal system: Secondary | ICD-10-CM

## 2015-04-28 DIAGNOSIS — R293 Abnormal posture: Secondary | ICD-10-CM

## 2015-04-28 NOTE — Therapy (Addendum)
[place], [place], [address] Phone: [phone]   Fax:  [phone]  Physical Therapy Treatment  Patient Details  Name: [name] [name] MRN: [id number] Date of Birth: 11-05-1956 Referring Provider:  Colon Branch, MD  Encounter Date: 04/28/2015      PT End of Session - 04/28/15 1724    Visit Number 16   Number of Visits 16   Date for PT Re-Evaluation 05/15/15   PT Start Time 0433   PT Stop Time 0515   PT Time Calculation (min) 42 min      Past Medical History  Diagnosis Date  . COPD (chronic obstructive pulmonary disease)     per CT 11/10/05, will minimal bronchiectasis rll  . GERD (gastroesophageal reflux disease)   . Osteoporosis     dexa per gyn  . 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 Headache(784.0)   . Lichen planus   . B12 deficiency   . Endometriosis   . Cataracts, bilateral   . Arthritis   . Asthma     .  years ago- has not had problem for years.  . Hemorrhoid   . Pneumothorax, spontaneous, tension     x 2 in her 20's.  has a chest tube for one  . 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 Bronchiectasis   . Shoulder dislocation 2015     R shoulder , x 2     Past Surgical History  Procedure Laterality Date  . Knee surgery      .not replacement .x8  . Ovarian cyst removed    . Video bronchoscopy  02/15/2012    Procedure: VIDEO BRONCHOSCOPY WITH FLUORO;  Surgeon: [name], MD;  Location: [facility];  Service: Endoscopy;  Laterality: Bilateral;  WASHING- INTERVENTION BIOPSIES INTERVENTION BRONCHOSCOPY WITH VIDEO  . Mediastinoscopy  05/25/2012    Procedure: MEDIASTINOSCOPY;  Surgeon: [name], MD;  Location: [facility] OR;  Service: Thoracic;  Laterality: N/A;  . Tonsillectomy    . Flexible bronchoscopy  11/05/2012    Procedure: FLEXIBLE BRONCHOSCOPY;  Surgeon: [name], MD;  Location: [place];  Service:  Thoracic;  Laterality: N/A;  . Thoracotomy  11/05/2012    Procedure: THORACOTOMY MAJOR;  Surgeon: [name], MD;  Location: [place];  Service: Thoracic;  Laterality: Right;  . Lobectomy  11/05/2012    Procedure: LOBECTOMY;  Surgeon: [name], MD;  Location: [facility] OR;  Service: Thoracic;  Laterality: Right;  right upper lobectomy  . Esophagogastroduodenoscopy  12/03/2012    Procedure: ESOPHAGOGASTRODUODENOSCOPY (EGD);  Surgeon: [name], MD;  Location: [facility] ENDOSCOPY;  Service: Endoscopy;  Laterality: N/A;    There were no vitals filed for this visit.  Visit Diagnosis:  Abnormal posture  Weakness of right upper extremity  Right shoulder pain          [facility] PT Assessment - 04/28/15 0001    Strength   Right Shoulder Flexion 4+/5   Right Shoulder ABduction 4/5   Right Shoulder Internal Rotation 4+/5   Right Shoulder External Rotation 4/5                     [facility] Adult PT Treatment/Exercise - 04/28/15 1729    Lumbar Exercises: Supine   Other Supine Lumbar Exercises alternating oblique isometrics x 10 with 5 sec hold (knee to opposite hand), then oblique pull down  one arm to opposite knee x 10 each   Shoulder Exercises: Prone   Other Prone Exercises chest/head lift from table x10    Other Prone Exercises shouder extension x 10   Shoulder Exercises: Standing   Extension Strengthening;Both;Theraband   Theraband Level (Shoulder Extension) Level 3 (Green)   Row Strengthening;Both;20 reps;Theraband   Theraband Level (Shoulder Row) Level 3 (Green)   Other Standing Exercises adduction with yellow band standing x 20  bilateral   Shoulder Exercises: ROM/Strengthening   Other ROM/Strengthening Exercises Lat pull down 1 plate x 10 wide, x 10 extra wide  seated in chair at lat pull down   Other ROM/Strengthening Exercises standing lumbar extension into orange ball x 10                  PT Short Term Goals - 03/16/15 0906    PT SHORT TERM GOAL #1    Title (p) independent with HEP (02/23/15)   Status (p) Achieved   PT SHORT TERM GOAL #2   Title (p) perform R shoulder ROM without end range pain (02/22/14)   Status (p) Achieved   PT SHORT TERM GOAL #3   Title (p) report 30% improvement in ADLs without increase in pain (02/22/14)   Status (p) Achieved           PT Long Term Goals - 04/28/15 1644    PT LONG TERM GOAL #1   Title independent with advanced HEP (03/23/15)   Time 8   Period Weeks   Status Achieved   PT LONG TERM GOAL #2   Title verbalize understanding of posture/body mechanics to reduce risk of reinjury (03/23/15)   Time 8   Period Weeks   Status Achieved   PT LONG TERM GOAL #3   Title improve R shoulder strength to at least 4/5 all motions for improved strength and function (03/23/15)   Time 8   Period Weeks   Status Achieved   PT LONG TERM GOAL #4   Title report not increase in pain with ADLs and no need for compensatory strategies (03/23/15)   Time 8   Period Weeks   Status Achieved   PT LONG TERM GOAL #5   Title Pt will be able to lift a 10 cup coffee pot with Rt. UE and report no lasting pain.    Time 8   Period Weeks   Status Achieved               Plan - 04/28/15 1726    Clinical Impression Statement All LTGs MEt. Pts FOTO score improved from 50% limited to 33% limited which was her predicted outcome.  She is able to lift full coffee pot and reports no pain with ADLs. She is still unable to sleep on her right side. She continues to have right side spasms that have been decreasing in frequency. She plans to continue her HEP to address spasms as well as continue shoulder strengthening. See goals Met.    PT Next Visit Plan discharge today.        Problem List Patient Active Problem List   Diagnosis Date Noted  . Upper back pain 03/31/2015  . Right shoulder injury 01/06/2015  . Prediabetes 04/02/2014  . Avulsion fracture of trochanter of femur 10/17/2013  . Shoulder dislocation 10/17/2013  .  Anxiety 10/17/2013  . Nausea alone 03/05/2013  . Other dysphagia 12/03/2012  . Odynophagia 11/28/2012  . 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 S/P lobectomy of lung 11/18/2012  .  Weight loss 12/13/2011  . Annual physical exam 06/23/2011  . INSOMNIA-SLEEP DISORDER-UNSPEC 11/22/2010  . PALPITATIONS 02/27/2009  . CIGARETTE SMOKER 12/24/2007  . BRONCHIECTASIS 12/24/2007  . ISCHEMIC COLITIS 12/24/2007  . HEPATIC CYST 12/24/2007  . HEADACHES, HX OF 12/24/2007  . B12 DEFICIENCY 12/30/2006  . ASTHMA 12/30/2006  . COPD GOLD 0  12/30/2006  . GERD 12/30/2006  . ENDOMETRIOSIS NOS 12/30/2006  . LICHEN PLANUS 43/15/4008  . OSTEOARTHRITIS 12/30/2006  . Osteoporosis 12/30/2006    Dorene Ar, PTA 04/28/2015, 5:33 PM  [place], [place], [address] Phone: [phone]   Fax:  [phone]     PHYSICAL THERAPY DISCHARGE SUMMARY  Visits from Start of Care: 16  Current functional level related to goals / functional outcomes: See goals met!    Remaining deficits: None limiting function: however, posture, muscle weakness in UE, core.     Education / Equipment: Posture, Osteoporosis prec, HEP, activity Plan: Patient agrees to discharge.  Patient goals were met. Patient is being discharged due to meeting the stated rehab goals.  ?????   Raeford Razor, PT 04/29/2015 1:58 PM Phone: [phone] Fax: [phone]

## 2015-04-28 NOTE — Telephone Encounter (Signed)
Pt is requesting refill on Alprazolam.  Last OV: 04/07/2015 Last Fill: 12/29/2014 # 10 0RF UDS: 04/07/2015 Low risk  Please advise.

## 2015-04-28 NOTE — Telephone Encounter (Signed)
Rx printed, awaiting MD signature.  

## 2015-04-28 NOTE — Telephone Encounter (Signed)
Rx faxed to [facility].  

## 2015-04-28 NOTE — Telephone Encounter (Signed)
Ok 90 and 1

## 2015-05-22 ENCOUNTER — Other Ambulatory Visit (HOSPITAL_COMMUNITY)
Admission: RE | Admit: 2015-05-22 | Discharge: 2015-05-22 | Disposition: A | Discharge status: Home / Self Care | Payer: 59 | Source: Ambulatory Visit | Attending: Women's Health | Admitting: Women's Health

## 2015-05-22 ENCOUNTER — Encounter: Payer: Self-pay | Admitting: Women's Health

## 2015-05-22 ENCOUNTER — Ambulatory Visit (INDEPENDENT_AMBULATORY_CARE_PROVIDER_SITE_OTHER): Payer: 59 | Admitting: Women's Health

## 2015-05-22 VITALS — BP 122/78 | Ht 66.0 in | Wt 121.0 lb

## 2015-05-22 DIAGNOSIS — Z01419 Encounter for gynecological examination (general) (routine) without abnormal findings: Secondary | ICD-10-CM

## 2015-05-22 DIAGNOSIS — B373 Candidiasis of vulva and vagina: Secondary | ICD-10-CM | POA: Diagnosis not present

## 2015-05-22 DIAGNOSIS — N898 Other specified noninflammatory disorders of vagina: Secondary | ICD-10-CM

## 2015-05-22 DIAGNOSIS — B3731 Acute candidiasis of vulva and vagina: Secondary | ICD-10-CM

## 2015-05-22 DIAGNOSIS — Z1151 Encounter for screening for human papillomavirus (HPV): Secondary | ICD-10-CM | POA: Diagnosis present

## 2015-05-22 LAB — WET PREP FOR TRICH, YEAST, CLUE
Clue Cells Wet Prep HPF POC: NONE SEEN
Trich, Wet Prep: NONE SEEN

## 2015-05-22 MED ORDER — FLUCONAZOLE 150 MG PO TABS: ORAL_TABLET | ORAL | Status: DC

## 2015-05-22 NOTE — Progress Notes (Signed)
[name] [name] [birth year] [id number]    History:    Presents for annual exam. Postmenopausal on no HRT and not sexually active.Annual at primary care reported  brown, yellow discharge. Normal Pap and mammogram history. 09/2014 colonoscopy for benign polyps. 04/2014 DEXA T score -4 at spine declines medication. History of a fractured hip with traumatic fall. Smoker has had Pneumovax and Prevnar 13.  Past medical history, past surgical history, family history and social history were all reviewed and documented in the EPIC chart. Originally from [place], lived in [place] and now works for [employer]. Numerous health problems asthma.  ROS:  A ROS was performed and pertinent positives and negatives are included.  Exam:  Filed Vitals:   05/22/15 0826  BP: 122/78    General appearance:  Normal Thyroid:  Symmetrical, normal in size, without palpable masses or nodularity. Respiratory  Auscultation:  Clear without wheezing or rhonchi Cardiovascular  Auscultation:  Regular rate, without rubs, murmurs or gallops  Edema/varicosities:  Not grossly evident Abdominal  Soft,nontender, without masses, guarding or rebound.  Liver/spleen:  No organomegaly noted  Hernia:  None appreciated  Skin  Inspection:  Grossly normal   Breasts: Examined lying and sitting.     Right: Without masses, retractions, discharge or axillary adenopathy.     Left: Without masses, retractions, discharge or axillary adenopathy. Gentitourinary   Inguinal/mons:  Normal without inguinal adenopathy  External genitalia:  Normal  BUS/Urethra/Skene's glands:  Normal  Vagina:  Asymptomatic vaginal atrophy wet prep positive for yeast  Cervix:  Normal  Uterus:   normal in size, shape and contour.  Midline and mobile  Adnexa/parametria:     Rt: Without masses or tenderness.   Lt: Without masses or tenderness.  Anus and perineum: Normal  Digital rectal exam: Normal sphincter tone without palpated masses or  tenderness  Assessment/Plan:  59 y.o. S WF G0 for annual exam.    Yeast vaginitis Postmenopausal/no leading/no HRT/not sexually active Osteoporosis declines medication Asthma-primary care manages labs and meds  Plan: Diflucan 150 by mouth today repeat in 3 days if needed, yeast prevention discussed. Call if no relief of discharge. SBE's, overdue for annual mammogram 3-D tomography reviewed and encouraged instructed to schedule at breast center. Continue and increase regular exercise, calcium rich foods, vitamin D 2000 daily encouraged. Home safety, fall prevention and importance of weightbearing exercise reviewed. Pap with HR HPV typing. Screening guidelines reviewed.    [place], 1:39 PM 05/22/2015

## 2015-05-22 NOTE — Patient Instructions (Signed)
Health Recommendations for Postmenopausal Women Respected and ongoing research has looked at the most common causes of death, disability, and poor quality of life in postmenopausal women. The causes include heart disease, diseases of blood vessels, diabetes, depression, cancer, and bone loss (osteoporosis). Many things can be done to help lower the chances of developing these and other common problems. CARDIOVASCULAR DISEASE Heart Disease: A heart attack is a medical emergency. Know the signs and symptoms of a heart attack. Below are things women can do to reduce their risk for heart disease.   Do not smoke. If you smoke, quit.  Aim for a healthy weight. Being overweight causes many preventable deaths. Eat a healthy and balanced diet and drink an adequate amount of liquids.  Get moving. Make a commitment to be more physically active. Aim for 30 minutes of activity on most, if not all days of the week.  Eat for heart health. Choose a diet that is low in saturated fat and cholesterol and eliminate trans fat. Include whole grains, vegetables, and fruits. Read and understand the labels on food containers before buying.  Know your numbers. Ask your caregiver to check your blood pressure, cholesterol (total, HDL, LDL, triglycerides) and blood glucose. Work with your caregiver on improving your entire clinical picture.  High blood pressure. Limit or stop your table salt intake (try salt substitute and food seasonings). Avoid salty foods and drinks. Read labels on food containers before buying. Eating well and exercising can help control high blood pressure. STROKE  Stroke is a medical emergency. Stroke may be the result of a blood clot in a blood vessel in the brain or by a brain hemorrhage (bleeding). Know the signs and symptoms of a stroke. To lower the risk of developing a stroke:  Avoid fatty foods.  Quit smoking.  Control your diabetes, blood pressure, and irregular heart  rate. THROMBOPHLEBITIS (BLOOD CLOT) OF THE LEG  Becoming overweight and leading a stationary lifestyle may also contribute to developing blood clots. Controlling your diet and exercising will help lower the risk of developing blood clots. CANCER SCREENING  Breast Cancer: Take steps to reduce your risk of breast cancer.  You should practice "breast self-awareness." This means understanding the normal appearance and feel of your breasts and should include breast self-examination. Any changes detected, no matter how small, should be reported to your caregiver.  After age 40, you should have a clinical breast exam (CBE) every year.  Starting at age 40, you should consider having a mammogram (breast X-ray) every year.  If you have a family history of breast cancer, talk to your caregiver about genetic screening.  If you are at high risk for breast cancer, talk to your caregiver about having an MRI and a mammogram every year.  Intestinal or Stomach Cancer: Tests to consider are a rectal exam, fecal occult blood, sigmoidoscopy, and colonoscopy. Women who are high risk may need to be screened at an earlier age and more often.  Cervical Cancer:  Beginning at age 30, you should have a Pap test every 3 years as long as the past 3 Pap tests have been normal.  If you have had past treatment for cervical cancer or a condition that could lead to cancer, you need Pap tests and screening for cancer for at least 20 years after your treatment.  If you had a hysterectomy for a problem that was not cancer or a condition that could lead to cancer, then you no longer need Pap tests.    If you are between ages 65 and 70, and you have had normal Pap tests going back 10 years, you no longer need Pap tests.  If Pap tests have been discontinued, risk factors (such as a new sexual partner) need to be reassessed to determine if screening should be resumed.  Some medical problems can increase the chance of getting  cervical cancer. In these cases, your caregiver may recommend more frequent screening and Pap tests.  Uterine Cancer: If you have vaginal bleeding after reaching menopause, you should notify your caregiver.  Ovarian Cancer: Other than yearly pelvic exams, there are no reliable tests available to screen for ovarian cancer at this time except for yearly pelvic exams.  Lung Cancer: Yearly chest X-rays can detect lung cancer and should be done on high risk women, such as cigarette smokers and women with chronic lung disease (emphysema).  Skin Cancer: A complete body skin exam should be done at your yearly examination. Avoid overexposure to the sun and ultraviolet light lamps. Use a strong sun block cream when in the sun. All of these things are important for lowering the risk of skin cancer. MENOPAUSE Menopause Symptoms: Hormone therapy products are effective for treating symptoms associated with menopause:  Moderate to severe hot flashes.  Night sweats.  Mood swings.  Headaches.  Tiredness.  Loss of sex drive.  Insomnia.  Other symptoms. Hormone replacement carries certain risks, especially in older women. Women who use or are thinking about using estrogen or estrogen with progestin treatments should discuss that with their caregiver. Your caregiver will help you understand the benefits and risks. The ideal dose of hormone replacement therapy is not known. The Food and Drug Administration (FDA) has concluded that hormone therapy should be used only at the lowest doses and for the shortest amount of time to reach treatment goals.  OSTEOPOROSIS Protecting Against Bone Loss and Preventing Fracture If you use hormone therapy for prevention of bone loss (osteoporosis), the risks for bone loss must outweigh the risk of the therapy. Ask your caregiver about other medications known to be safe and effective for preventing bone loss and fractures. To guard against bone loss or fractures, the  following is recommended:  If you are younger than age 50, take 1000 mg of calcium and at least 600 mg of Vitamin D per day.  If you are older than age 50 but younger than age 70, take 1200 mg of calcium and at least 600 mg of Vitamin D per day.  If you are older than age 70, take 1200 mg of calcium and at least 800 mg of Vitamin D per day. Smoking and excessive alcohol intake increases the risk of osteoporosis. Eat foods rich in calcium and vitamin D and do weight bearing exercises several times a week as your caregiver suggests. DIABETES Diabetes Mellitus: If you have type I or type 2 diabetes, you should keep your blood sugar under control with diet, exercise, and recommended medication. Avoid starchy and fatty foods, and too many sweets. Being overweight can make diabetes control more difficult. COGNITION AND MEMORY Cognition and Memory: Menopausal hormone therapy is not recommended for the prevention of cognitive disorders such as Alzheimer's disease or memory loss.  DEPRESSION  Depression may occur at any age, but it is common in elderly women. This may be because of physical, medical, social (loneliness), or financial problems and needs. If you are experiencing depression because of medical problems and control of symptoms, talk to your caregiver about this. Physical   activity and exercise may help with mood and sleep. Community and volunteer involvement may improve your sense of value and worth. If you have depression and you feel that the problem is getting worse or becoming severe, talk to your caregiver about which treatment options are best for you. ACCIDENTS  Accidents are common and can be serious in elderly woman. Prepare your house to prevent accidents. Eliminate throw rugs, place hand bars in bath, shower, and toilet areas. Avoid wearing high heeled shoes or walking on wet, snowy, and icy areas. Limit or stop driving if you have vision or hearing problems, or if you feel you are  unsteady with your movements and reflexes. HEPATITIS C Hepatitis C is a type of viral infection affecting the liver. It is spread mainly through contact with blood from an infected person. It can be treated, but if left untreated, it can lead to severe liver damage over the years. Many people who are infected do not know that the virus is in their blood. If you are a "baby-boomer", it is recommended that you have one screening test for Hepatitis C. IMMUNIZATIONS  Several immunizations are important to consider having during your senior years, including:   Tetanus, diphtheria, and pertussis booster shot.  Influenza every year before the flu season begins.  Pneumonia vaccine.  Shingles vaccine.  Others, as indicated based on your specific needs. Talk to your caregiver about these. Document Released: 01/13/2006 Document Revised: 04/07/2014 Document Reviewed: 09/08/2008 ExitCare Patient Information 2015 [employer]. This information is not intended to replace advice given to you by your health care provider. Make sure you discuss any questions you have with your health care provider. Exercise to Stay Healthy Exercise helps you become and stay healthy. EXERCISE IDEAS AND TIPS Choose exercises that:  You enjoy.  Fit into your day. You do not need to exercise really hard to be healthy. You can do exercises at a slow or medium level and stay healthy. You can:  Stretch before and after working out.  Try yoga, Pilates, or tai chi.  Lift weights.  Walk fast, swim, jog, run, climb stairs, bicycle, dance, or rollerskate.  Take aerobic classes. Exercises that burn about 150 calories:  Running 1  miles in 15 minutes.  Playing volleyball for 45 to 60 minutes.  Washing and waxing a car for 45 to 60 minutes.  Playing touch football for 45 minutes.  Walking 1  miles in 35 minutes.  Pushing a stroller 1  miles in 30 minutes.  Playing basketball for 30 minutes.  Raking leaves  for 30 minutes.  Bicycling 5 miles in 30 minutes.  Walking 2 miles in 30 minutes.  Dancing for 30 minutes.  Shoveling snow for 15 minutes.  Swimming laps for 20 minutes.  Walking up stairs for 15 minutes.  Bicycling 4 miles in 15 minutes.  Gardening for 30 to 45 minutes.  Jumping rope for 15 minutes.  Washing windows or floors for 45 to 60 minutes. Document Released: 12/24/2010 Document Revised: 02/13/2012 Document Reviewed: 12/24/2010 ExitCare Patient Information 2015 [employer]. This information is not intended to replace advice given to you by your health care provider. Make sure you discuss any questions you have with your health care provider.  

## 2015-05-23 LAB — URINALYSIS W MICROSCOPIC + REFLEX CULTURE
BACTERIA UA: NONE SEEN
Bilirubin Urine: NEGATIVE
CRYSTALS: NONE SEEN
Casts: NONE SEEN
GLUCOSE, UA: NEGATIVE mg/dL
HGB URINE DIPSTICK: NEGATIVE
Ketones, ur: NEGATIVE mg/dL
Nitrite: NEGATIVE
PROTEIN: NEGATIVE mg/dL
SQUAMOUS EPITHELIAL / LPF: NONE SEEN
Specific Gravity, Urine: <1.005 — ABNORMAL LOW (ref 1.005–1.030)
Urobilinogen, UA: 0.2 mg/dL (ref 0.0–1.0)
pH: 7 (ref 5.0–8.0)

## 2015-05-24 LAB — URINE CULTURE

## 2015-05-25 ENCOUNTER — Other Ambulatory Visit: Payer: Self-pay

## 2015-05-25 DIAGNOSIS — Z1231 Encounter for screening mammogram for malignant neoplasm of breast: Secondary | ICD-10-CM

## 2015-05-25 LAB — CYTOLOGY - PAP

## 2015-05-26 ENCOUNTER — Encounter: Payer: Self-pay | Admitting: Women's Health

## 2015-06-15 ENCOUNTER — Ambulatory Visit (INDEPENDENT_AMBULATORY_CARE_PROVIDER_SITE_OTHER): Payer: 59 | Admitting: Internal Medicine

## 2015-06-15 ENCOUNTER — Encounter: Payer: Self-pay | Admitting: Internal Medicine

## 2015-06-15 ENCOUNTER — Ambulatory Visit
Admission: RE | Admit: 2015-06-15 | Discharge: 2015-06-15 | Disposition: A | Discharge status: Home / Self Care | Payer: 59 | Source: Ambulatory Visit | Attending: Internal Medicine | Admitting: Internal Medicine

## 2015-06-15 VITALS — BP 122/74 | HR 109 | Temp 99.1°F | Resp 18 | Ht 66.0 in | Wt 117.0 lb

## 2015-06-15 DIAGNOSIS — J470 Bronchiectasis with acute lower respiratory infection: Secondary | ICD-10-CM

## 2015-06-15 DIAGNOSIS — M7989 Other specified soft tissue disorders: Secondary | ICD-10-CM

## 2015-06-15 LAB — CBC WITH DIFFERENTIAL/PLATELET
Basophils Absolute: 0 10*3/uL (ref 0.0–0.1)
Basophils Relative: 0.5 % (ref 0.0–3.0)
Eosinophils Absolute: 0.2 10*3/uL (ref 0.0–0.7)
Eosinophils Relative: 2.8 % (ref 0.0–5.0)
HCT: 43.3 % (ref 36.0–46.0)
HEMOGLOBIN: 14.6 g/dL (ref 12.0–15.0)
Lymphocytes Relative: 22.3 % (ref 12.0–46.0)
Lymphs Abs: 1.8 10*3/uL (ref 0.7–4.0)
MCHC: 33.7 g/dL (ref 30.0–36.0)
MCV: 104.2 fl — ABNORMAL HIGH (ref 78.0–100.0)
MONO ABS: 0.5 10*3/uL (ref 0.1–1.0)
Monocytes Relative: 5.8 % (ref 3.0–12.0)
NEUTROS ABS: 5.7 10*3/uL (ref 1.4–7.7)
Neutrophils Relative %: 68.6 % (ref 43.0–77.0)
Platelets: 439 10*3/uL — ABNORMAL HIGH (ref 150.0–400.0)
RBC: 4.15 Mil/uL (ref 3.87–5.11)
RDW: 12.8 % (ref 11.5–15.5)
WBC: 8.3 10*3/uL (ref 4.0–10.5)

## 2015-06-15 LAB — COMPREHENSIVE METABOLIC PANEL
ALT: 14 U/L (ref 0–35)
AST: 18 U/L (ref 0–37)
Albumin: 5.2 g/dL (ref 3.5–5.2)
Alkaline Phosphatase: 44 U/L (ref 39–117)
BUN: 7 mg/dL (ref 6–23)
CO2: 28 mEq/L (ref 19–32)
CREATININE: 0.78 mg/dL (ref 0.40–1.20)
Calcium: 10.1 mg/dL (ref 8.4–10.5)
Chloride: 95 mEq/L — ABNORMAL LOW (ref 96–112)
GFR: 80.87 mL/min (ref >60.00)
Glucose, Bld: 93 mg/dL (ref 70–99)
Potassium: 3.5 mEq/L (ref 3.5–5.1)
Sodium: 135 mEq/L (ref 135–145)
TOTAL PROTEIN: 8.1 g/dL (ref 6.0–8.3)
Total Bilirubin: 0.6 mg/dL (ref 0.2–1.2)

## 2015-06-15 NOTE — Patient Instructions (Signed)
Get your blood work before you leave   Stop by the first floor and get the XR   We need to get your ultrasound of the right arm.

## 2015-06-15 NOTE — Progress Notes (Signed)
Pre visit review using our clinic review tool, if applicable. No additional management support is needed unless otherwise documented below in the visit note. 

## 2015-06-15 NOTE — Progress Notes (Signed)
Subjective:    Patient ID: [name] [name], female    DOB: [birth year], 59 y.o.   MRN: [id number]  DOS:  06/15/2015 Type of visit - description : Acute Interval history: Noted right arm swelling 4 days ago. Denies redness or warmness. No actual pain except for some residual discomfort after a shoulder injury few months ago.  Also, yesterday when she laid down in bed felt that her head was congested "like when you  bend over and you feel a rash in your head"  Has a history of COPD and a indolent lung infection: She self discontinue Spiriva and Qvar few days ago thinking that that may be contributing to the swelling and actually started to take Maxide leftover for many years ago. Has noticed no difference.    Review of Systems When asked, admits to low-grade fever for 2 months. No chills. Low-grade fever is not an uncommon issue for her. Occasional chest pain, right side of the chest on and off for a year. Shortness of breath at baseline Cough on and off, at baseline, did see some streaks of blood in the sputum 2 weeks ago (not a new problem for her). No recent weight loss.   Past Medical History  Diagnosis Date  . COPD (chronic obstructive pulmonary disease)     per CT 11/10/05, will minimal bronchiectasis rll  . GERD (gastroesophageal reflux disease)   . Osteoporosis     dexa per gyn  . 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 Headache(784.0)   . Lichen planus   . B12 deficiency   . Endometriosis   . Cataracts, bilateral   . Arthritis   . Hemorrhoid   . Pneumothorax, spontaneous, tension     x 2 in her 20's.  has a chest tube for one  . 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 Bronchiectasis   . Shoulder dislocation 2015     R shoulder , x 2     Past Surgical History  Procedure Laterality Date  . Knee surgery      .not replacement .x8  . Ovarian cyst removed    . Video bronchoscopy  02/15/2012   Procedure: VIDEO BRONCHOSCOPY WITH FLUORO;  Surgeon: [name], MD;  Location: [facility];  Service: Endoscopy;  Laterality: Bilateral;  WASHING- INTERVENTION BIOPSIES INTERVENTION BRONCHOSCOPY WITH VIDEO  . Mediastinoscopy  05/25/2012    Procedure: MEDIASTINOSCOPY;  Surgeon: [name], MD;  Location: [facility] OR;  Service: Thoracic;  Laterality: N/A;  . Tonsillectomy    . Flexible bronchoscopy  11/05/2012    Procedure: FLEXIBLE BRONCHOSCOPY;  Surgeon: [name], MD;  Location: [place];  Service: Thoracic;  Laterality: N/A;  . Thoracotomy  11/05/2012    Procedure: THORACOTOMY MAJOR;  Surgeon: [name], MD;  Location: [place];  Service: Thoracic;  Laterality: Right;  . Lobectomy  11/05/2012    Procedure: LOBECTOMY;  Surgeon: [name], MD;  Location: [facility] OR;  Service: Thoracic;  Laterality: Right;  right upper lobectomy  . Esophagogastroduodenoscopy  12/03/2012    Procedure: ESOPHAGOGASTRODUODENOSCOPY (EGD);  Surgeon: [name], MD;  Location: [facility] END[facility]COPY;  Service: Endoscopy;  Laterality: N/A;    History   Social History  . Marital Status: Single    Spouse Name: N/A  . Number of Children: 0  . Years of Education: N/A   Occupational History  . HR Coordinator  [employer]   . [employer]   Social History Main Topics  . Smoking status: Current Every Day Smoker -- 0.40 packs/day for 38 years    Types: Cigarettes  . Smokeless tobacco: Never Used     Comment: < 1 ppd   . Alcohol Use: 4.2 oz/week    7 Glasses of wine per week  . Drug Use: No  . Sexual Activity: Not Currently     Comment: 1st intercourse 59 yo-More than 5 partners   Other Topics Concern  . Not on file   Social History Narrative   sister lives w/ pt    Lost mom 12-2013        Medication List       This list is accurate as of: 06/15/15 11:59 PM.  Always use your most recent med list.               acetaminophen 500 MG tablet  Commonly known as:  TYLENOL  Take 500 mg by mouth  every 4 (four) hours as needed for moderate pain or fever. For fever     ALPRAZolam 0.5 MG tablet  Commonly known as:  XANAX  Take 0.5-1 tablets (0.25-0.5 mg total) by mouth at bedtime as needed for sleep.     aspirin 81 MG chewable tablet  Chew 81 mg by mouth daily.     beclomethasone 80 MCG/ACT inhaler  Commonly known as:  QVAR  Inhale 2 puffs into the lungs 2 (two) times daily.     Biotin 1000 MCG tablet  Take 1,000 mcg by mouth 3 (three) times daily.     HYDROcodone-acetaminophen 10-325 MG per tablet  Commonly known as:  NORCO  Take 1 tablet by mouth every 6 (six) hours as needed.     LYSINE PO  Take 1 tablet by mouth daily.     Magnesium 400 MG Tabs  Take 1 tablet by mouth daily.     methocarbamol 500 MG tablet  Commonly known as:  ROBAXIN  Take 1 tablet (500 mg total) by mouth every 8 (eight) hours as needed for muscle spasms.     NICORETTE 4 MG lozenge  Generic drug:  nicotine polacrilex  Place 4 mg inside cheek every 3 (three) hours as needed for smoking cessation. For nicotine cravings.     nicotine 10 MG inhaler  Commonly known as:  NICOTROL  Inhale 10 mg into the lungs as needed.     pantoprazole 40 MG tablet  Commonly known as:  PROTONIX  TAKE 1 TABLET BY MOUTH TWICE DAILY BEFORE A MEAL     tiotropium 18 MCG inhalation capsule  Commonly known as:  [place] into inhaler and inhale.     VITAMIN D PO  Take 1 tablet by mouth daily.           Objective:   Physical Exam BP 122/74 mmHg  Pulse 109  Temp(Src) 99.1 F (37.3 C) (Oral)  Resp 18  Ht 5\' 6"  (1.676 m)  Wt 117 lb (53.071 kg)  BMI 18.89 kg/m2  SpO2 96%  LMP 11/05/2004 General:   Well developed, slightly under weight appearing, no apparent distress HEENT:  Normocephalic . Face symmetric, atraumatic Neck: No actual mass, supraclavicular areas are symmetric but some TTP noted on the right side. Lungs:  Decreased breath sounds bilaterally. Normal respiratory effort, no intercostal  retractions, no accessory muscle use. Chest wall: Superficial veins slightly more noticeable on the right side Heart: RRR,  no murmur.  no pretibial edema bilaterally  Abdomen:  Not distended, soft, non-tender. No rebound or rigidity. No mass,organomegaly Extremities: Right arm without redness or a rash. I measured the biceps and the right one is about half inch larger in circumference. The right arm does look puffy but no pitting edema. Skin: Not pale. Not jaundice Neurologic:  alert & oriented X3.  Speech normal, gait appropriate for age and unassisted Psych--  Cognition and judgment appear intact.  Cooperative with normal attention span and concentration.  Behavior appropriate. No anxious or depressed appearing.       Assessment & Plan:    Right arm swelling Patient presents with right arm swelling in the context off well known indolent pulmonary infection  I am concern about right arm DVT versus a Pancoast syndrome type of picture. Plan: Chest x-ray, stat ultrasound right arm, labs in preparation for possible CT of the chest. Further advice would result   Addendum: Ultrasound negative for DVT, labs essentially normal, chest x-ray with no acute changes Recommend observation. Patient to call if she has increase low-grade temperatures or if she sees more blood in the sputum.. Go back to her  inhalers, stop the Maxzide (A left other water pill she was taking) Also to see Dr. Olena Heckle, pulmonology as planned. Follow-up in one month for a checkup

## 2015-06-16 ENCOUNTER — Telehealth: Payer: Self-pay | Admitting: Internal Medicine

## 2015-06-16 ENCOUNTER — Ambulatory Visit (HOSPITAL_BASED_OUTPATIENT_CLINIC_OR_DEPARTMENT_OTHER)
Admission: RE | Admit: 2015-06-16 | Discharge: 2015-06-16 | Disposition: A | Discharge status: Home / Self Care | Payer: 59 | Source: Ambulatory Visit | Attending: Internal Medicine | Admitting: Internal Medicine

## 2015-06-16 DIAGNOSIS — M7989 Other specified soft tissue disorders: Secondary | ICD-10-CM | POA: Diagnosis present

## 2015-06-16 DIAGNOSIS — M79601 Pain in right arm: Secondary | ICD-10-CM

## 2015-06-16 NOTE — Telephone Encounter (Signed)
Please call the patient: [name] [name], labs essentially normal, chest x-ray with no acute changes Recommend observation for swelling of the right arm. Patient to call if she has increase low-grade temperatures or if she sees more blood in the sputum.. Go back to her  inhalers, stop the Maxzide (A left other water pill she was taking) to see Dr. Olena Heckle, pulmonology as planned. Follow-up in one month for a checkup

## 2015-06-16 NOTE — Telephone Encounter (Signed)
Right arm ultrasound reordered to [facility]. Needs to be completed TODAY. Pt was supposed to have ultrasound yesterday before she left but did not.

## 2015-06-16 NOTE — Telephone Encounter (Signed)
Spoke with Pt, informed her of lab results, ultrasound, and chest x-ray results. Informed her of Dr. Ethel Rana recommendations. Pt verbalized understanding and has F/U appt scheduled for 07/20/2015.

## 2015-06-16 NOTE — Telephone Encounter (Signed)
I misread the previous message. Please disregard. I have cancelled the one for [facility].

## 2015-06-16 NOTE — Telephone Encounter (Signed)
Caller name:Rika  Can be YHCWCBJ:[phone]  Pharmacy:  Reason for call:SHE IS HAVING HER TESTS DONE AT [facility] THIS MORNING AND WILL NOT NEED  THE ORDER FOR [facility]

## 2015-06-22 ENCOUNTER — Encounter: Payer: Self-pay | Admitting: Internal Medicine

## 2015-06-22 ENCOUNTER — Telehealth: Payer: Self-pay

## 2015-06-22 NOTE — Telephone Encounter (Signed)
-----   Message from Colon Branch, MD sent at 06/22/2015 12:55 PM EDT ----- Please print a letter and let patient know when ready  To whom it may concern  [name] [name] is a patient of mine, she has multiple medical problems and take multiple medications. I think it will be very taxing for her to serve her jury duty as she has scoliosis and has a hard time staying seated in the same position.

## 2015-06-22 NOTE — Telephone Encounter (Signed)
Letter printed and signed by Dr. Larose Kells. MyChart message sent to Pt informing her that I can mail letter or place at front desk for pick up. Awaiting Pt's response.

## 2015-06-23 NOTE — Telephone Encounter (Signed)
Received MyChart message from Pt, she is requesting we mail letter to her address. Informed Pt that letter has been placed in mail today.

## 2015-06-26 ENCOUNTER — Ambulatory Visit
Admission: RE | Admit: 2015-06-26 | Discharge: 2015-06-26 | Disposition: A | Discharge status: Home / Self Care | Payer: 59 | Source: Ambulatory Visit

## 2015-06-26 DIAGNOSIS — Z1231 Encounter for screening mammogram for malignant neoplasm of breast: Secondary | ICD-10-CM

## 2015-06-29 ENCOUNTER — Encounter: Payer: Self-pay | Admitting: Women's Health

## 2015-07-06 ENCOUNTER — Telehealth: Payer: Self-pay | Admitting: Internal Medicine

## 2015-07-06 MED ORDER — HYDROCODONE-ACETAMINOPHEN 10-325 MG PO TABS: 1.0000 | ORAL_TABLET | Freq: Four times a day (QID) | ORAL | Status: DC | PRN

## 2015-07-06 NOTE — Telephone Encounter (Signed)
Please inform Pt that Rx's are ready for pick up at the front desk at her convenience. Thank you.

## 2015-07-06 NOTE — Telephone Encounter (Signed)
Caller name:Lorrie Relationship to patient:self Can be reached:[phone] Pharmacy:  Reason for call:needs written rx for hydrocodone

## 2015-07-06 NOTE — Telephone Encounter (Signed)
Rx's printed for August and September 2016, awaiting MD signature.

## 2015-07-06 NOTE — Telephone Encounter (Signed)
Ok  #90, 2 prescriptions 

## 2015-07-06 NOTE — Telephone Encounter (Signed)
Pt is requesting refill on Hydrocodone.  Last OV: 06/15/2015 Last Fill: 04/22/2015 #[id number] UDS: 04/07/2015 Low risk   Please advise.

## 2015-07-13 ENCOUNTER — Ambulatory Visit: Payer: Self-pay | Admitting: Internal Medicine

## 2015-07-20 ENCOUNTER — Encounter: Payer: Self-pay | Admitting: Internal Medicine

## 2015-07-20 ENCOUNTER — Ambulatory Visit (INDEPENDENT_AMBULATORY_CARE_PROVIDER_SITE_OTHER): Payer: 59 | Admitting: Internal Medicine

## 2015-07-20 VITALS — BP 120/66 | HR 95 | Temp 98.3°F | Ht 66.0 in | Wt 117.2 lb

## 2015-07-20 DIAGNOSIS — M159 Polyosteoarthritis, unspecified: Secondary | ICD-10-CM

## 2015-07-20 DIAGNOSIS — J470 Bronchiectasis with acute lower respiratory infection: Secondary | ICD-10-CM | POA: Diagnosis not present

## 2015-07-20 DIAGNOSIS — R634 Abnormal weight loss: Secondary | ICD-10-CM

## 2015-07-20 DIAGNOSIS — M150 Primary generalized (osteo)arthritis: Secondary | ICD-10-CM

## 2015-07-20 DIAGNOSIS — M7989 Other specified soft tissue disorders: Secondary | ICD-10-CM | POA: Diagnosis not present

## 2015-07-20 NOTE — Assessment & Plan Note (Addendum)
Clinical stable. Has an appointment to see Dr. Olena Heckle pulmonology next month.

## 2015-07-20 NOTE — Progress Notes (Signed)
Subjective:    Patient ID: [name] [name], female    DOB: 09/07/1956, 59 y.o.   MRN: [id number]  DOS:  07/20/2015 Type of visit - description : Routine office visit Interval history: Bronchiectasis: Cough is at baseline, had a very small episode of hemoptysis 2 weeks ago. Right arm swelling. Workup was negative, reports recent negative mammogram and normal self breast exam. The right arm remains slightly puffy and she has a very subtle numbness and the tricipital area. She discussed the issue with Dr. Olena Heckle via Deloris Ping according to the patient DJD: On hydrocodone, symptoms relatively well controlled   Review of Systems  Denies actual chills, she has subjective fever, temperature reportedly less than 99, increased temperature has been less frequent than before   Past Medical History  Diagnosis Date  . COPD (chronic obstructive pulmonary disease)     per CT 11/10/05, will minimal bronchiectasis rll  . GERD (gastroesophageal reflux disease)   . Osteoporosis     dexa per gyn  . 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 Headache(784.0)   . Lichen planus   . B12 deficiency   . Endometriosis   . Cataracts, bilateral   . Arthritis   . Hemorrhoid   . Pneumothorax, spontaneous, tension     x 2 in her 20's.  has a chest tube for one  . 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 Bronchiectasis   . Shoulder dislocation 2015     R shoulder , x 2     Past Surgical History  Procedure Laterality Date  . Knee surgery      .not replacement .x8  . Ovarian cyst removed    . Video bronchoscopy  02/15/2012    Procedure: VIDEO BRONCHOSCOPY WITH FLUORO;  Surgeon: [name], MD;  Location: [facility];  Service: Endoscopy;  Laterality: Bilateral;  WASHING- INTERVENTION BIOPSIES INTERVENTION BRONCHOSCOPY WITH VIDEO  . Mediastinoscopy  05/25/2012    Procedure: MEDIASTINOSCOPY;  Surgeon: [name], MD;  Location: [facility] OR;   Service: Thoracic;  Laterality: N/A;  . Tonsillectomy    . Flexible bronchoscopy  11/05/2012    Procedure: FLEXIBLE BRONCHOSCOPY;  Surgeon: [name], MD;  Location: [place];  Service: Thoracic;  Laterality: N/A;  . Thoracotomy  11/05/2012    Procedure: THORACOTOMY MAJOR;  Surgeon: [name], MD;  Location: [place];  Service: Thoracic;  Laterality: Right;  . Lobectomy  11/05/2012    Procedure: LOBECTOMY;  Surgeon: [name], MD;  Location: [facility] OR;  Service: Thoracic;  Laterality: Right;  right upper lobectomy  . Esophagogastroduodenoscopy  12/03/2012    Procedure: ESOPHAGOGASTRODUODENOSCOPY (EGD);  Surgeon: [name], MD;  Location: [facility] ENDOSCOPY;  Service: Endoscopy;  Laterality: N/A;    Social History   Social History  . Marital Status: Single    Spouse Name: N/A  . Number of Children: 0  . Years of Education: N/A   Occupational History  . 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 [place]   Social History Main Topics  . Smoking status: Current Every Day Smoker -- 0.40 packs/day for 38 years    Types: Cigarettes  . Smokeless tobacco: Never Used     Comment: < 1 ppd   . Alcohol Use: 4.2 oz/week    7 Glasses of wine per week  . Drug Use: No  . Sexual Activity:  Not Currently     Comment: 1st intercourse 59 yo-More than 5 partners   Other Topics Concern  . Not on file   Social History Narrative   sister lives w/ pt    Lost mom 12-2013        Medication List       This list is accurate as of: 07/20/15 12:36 PM.  Always use your most recent med list.               acetaminophen 500 MG tablet  Commonly known as:  TYLENOL  Take 500 mg by mouth every 4 (four) hours as needed for moderate pain or fever. For fever     ALPRAZolam 0.5 MG tablet  Commonly known as:  XANAX  Take 0.5-1 tablets (0.25-0.5 mg total) by mouth at bedtime as needed for sleep.     aspirin 81 MG chewable tablet  Chew 81 mg by mouth daily.     beclomethasone 80 MCG/ACT inhaler    Commonly known as:  QVAR  Inhale 2 puffs into the lungs 2 (two) times daily.     Biotin 1000 MCG tablet  Take 1,000 mcg by mouth 3 (three) times daily.     DIUREX 50-162.5 MG Tabs  Generic drug:  Caffeine-Magnesium Salicylate  Take 1 tablet by mouth 2 (two) times daily.     HYDROcodone-acetaminophen 10-325 MG per tablet  Commonly known as:  NORCO  Take 1 tablet by mouth every 6 (six) hours as needed.     LYSINE PO  Take 1 tablet by mouth daily.     Magnesium 400 MG Tabs  Take 1 tablet by mouth daily.     methocarbamol 500 MG tablet  Commonly known as:  ROBAXIN  Take 1 tablet (500 mg total) by mouth every 8 (eight) hours as needed for muscle spasms.     NICORETTE 4 MG lozenge  Generic drug:  nicotine polacrilex  Place 4 mg inside cheek every 3 (three) hours as needed for smoking cessation. For nicotine cravings.     nicotine 10 MG inhaler  Commonly known as:  NICOTROL  Inhale 10 mg into the lungs as needed.     pantoprazole 40 MG tablet  Commonly known as:  PROTONIX  TAKE 1 TABLET BY MOUTH TWICE DAILY BEFORE A MEAL     tiotropium 18 MCG inhalation capsule  Commonly known as:  [place] into inhaler and inhale.     VITAMIN D PO  Take 1 tablet by mouth daily.           Objective:   Physical Exam BP 120/66 mmHg  Pulse 95  Temp(Src) 98.3 F (36.8 C) (Oral)  Ht 5\' 6"  (1.676 m)  Wt 117 lb 4 oz (53.184 kg)  BMI 18.93 kg/m2  SpO2 97%  LMP 11/05/2004 General:   Well developed, well nourished . NAD.  HEENT:  Normocephalic . Face symmetric, atraumatic Neck: No lymphadenopathies or mass. Lungs:  Decreased breath sounds Normal respiratory effort, no intercostal retractions, no accessory muscle use. Breasts: Symmetric, no dominant mass, skin and nipples normal. Extremities: Forearm measures 9 inches bilaterally Arms: 10 inches on the right and 9.5 on the left. Otherwise upper extremities are normal, no redness, warmness, TTP. Normal radial pulses Heart:  RRR,  no murmur.  No pretibial edema bilaterally  Skin: Not pale. Not jaundice Neurologic:  alert & oriented X3.  Speech normal, gait appropriate for age and unassisted Psych--  Cognition and judgment appear intact.  Cooperative  with normal attention span and concentration.  Behavior appropriate. No anxious or depressed appearing.      Assessment & Plan:   .

## 2015-07-20 NOTE — Progress Notes (Signed)
Pre visit review using our clinic review tool, if applicable. No additional management support is needed unless otherwise documented below in the visit note. 

## 2015-07-20 NOTE — Assessment & Plan Note (Signed)
Pain is relatively well controlled with hydrocodone, last UDS 04/2015

## 2015-07-20 NOTE — Patient Instructions (Signed)
Call if your arm change in any way.

## 2015-07-20 NOTE — Assessment & Plan Note (Addendum)
Since the last time she was here, a ultrasound of the right arm ruled out DVT, chest x-ray was stable and a  mammogram was negative. Breast exam today is normal. Etiology is not clear. No evidence of a deep-seated infection on clinical grounds. Plan: Recommend observation for now, plans to see Dr. Olena Heckle pulmonology next month, recommend to continue discussing the issue with pulmonology. Otherwise follow-up 4 months, call if symptoms increase

## 2015-07-20 NOTE — Assessment & Plan Note (Signed)
Wt Readings from Last 3 Encounters:  07/20/15 117 lb 4 oz (53.184 kg)  06/15/15 117 lb (53.071 kg)  05/22/15 121 lb (54.885 kg)   Weight stable

## 2015-08-26 ENCOUNTER — Other Ambulatory Visit: Payer: Self-pay | Admitting: *Deleted

## 2015-08-26 DIAGNOSIS — M25421 Effusion, right elbow: Secondary | ICD-10-CM

## 2015-08-26 DIAGNOSIS — R222 Localized swelling, mass and lump, trunk: Secondary | ICD-10-CM

## 2015-08-28 ENCOUNTER — Ambulatory Visit
Admission: RE | Admit: 2015-08-28 | Discharge: 2015-08-28 | Disposition: A | Discharge status: Home / Self Care | Payer: 59 | Source: Ambulatory Visit | Attending: *Deleted | Admitting: *Deleted

## 2015-08-28 DIAGNOSIS — M25421 Effusion, right elbow: Secondary | ICD-10-CM

## 2015-08-28 DIAGNOSIS — R222 Localized swelling, mass and lump, trunk: Secondary | ICD-10-CM

## 2015-08-28 MED ORDER — IOPAMIDOL (ISOVUE-300) INJECTION 61%
75.0000 mL | Freq: Once | INTRAVENOUS | Status: AC | PRN
  Administered 2015-08-28: 75 mL via INTRAVENOUS

## 2015-09-11 DIAGNOSIS — I871 Compression of vein: Secondary | ICD-10-CM

## 2015-09-11 HISTORY — DX: Compression of vein: I87.1

## 2015-09-17 ENCOUNTER — Telehealth: Payer: Self-pay | Admitting: Internal Medicine

## 2015-09-17 MED ORDER — HYDROCODONE-ACETAMINOPHEN 10-325 MG PO TABS: 1.0000 | ORAL_TABLET | Freq: Four times a day (QID) | ORAL | Status: DC | PRN

## 2015-09-17 NOTE — Telephone Encounter (Signed)
Okay for 2 prescriptions

## 2015-09-17 NOTE — Telephone Encounter (Signed)
Spoke with Pt, informed her that Rx is ready for pick up at the front desk. Pt verbalized understanding.

## 2015-09-17 NOTE — Telephone Encounter (Signed)
°  Relation to RA:QTMA Call back number:[phone]   Reason for call:  Patient requesting a refill HYDROcodone-acetaminophen (NORCO) 10-325 MG per tablet

## 2015-09-17 NOTE — Telephone Encounter (Signed)
Pt is requesting refill on Hydrocodone.  Last OV: 07/20/2015 Last Fill: 07/06/2015 2 prescriptions for August and September, #90 and 0RF UDS: 04/07/2015 Low risk  Please advise.

## 2015-09-17 NOTE — Telephone Encounter (Signed)
Rx's for October and November 2016 printed, awaiting MD signature.

## 2015-09-25 DIAGNOSIS — J0100 Acute maxillary sinusitis, unspecified: Secondary | ICD-10-CM | POA: Insufficient documentation

## 2015-09-25 DIAGNOSIS — I871 Compression of vein: Secondary | ICD-10-CM | POA: Insufficient documentation

## 2015-09-29 ENCOUNTER — Other Ambulatory Visit: Payer: Self-pay | Admitting: *Deleted

## 2015-09-29 DIAGNOSIS — I82B11 Acute embolism and thrombosis of right subclavian vein: Secondary | ICD-10-CM

## 2015-10-06 ENCOUNTER — Ambulatory Visit
Admission: RE | Admit: 2015-10-06 | Discharge: 2015-10-06 | Disposition: A | Discharge status: Home / Self Care | Payer: 59 | Source: Ambulatory Visit | Attending: *Deleted | Admitting: *Deleted

## 2015-10-06 ENCOUNTER — Other Ambulatory Visit (HOSPITAL_COMMUNITY): Payer: Self-pay | Admitting: Diagnostic Radiology

## 2015-10-06 DIAGNOSIS — I82B11 Acute embolism and thrombosis of right subclavian vein: Secondary | ICD-10-CM

## 2015-10-06 DIAGNOSIS — I82B19 Acute embolism and thrombosis of unspecified subclavian vein: Secondary | ICD-10-CM | POA: Insufficient documentation

## 2015-10-06 HISTORY — DX: Compression of vein: I87.1

## 2015-10-06 NOTE — Consult Note (Signed)
Chief Complaint: Patient was seen in consultation today for possible SVC syndrome at the request of [facility]  Referring Physician(s): Daniels,Mary L  History of Present Illness: [name] [name] is a 59 y.o. female with a complex medical history including previous right upper lobectomy and right lower lobe superior segmentectomy on 11/06/2012. The patient has been treated for Mycobacterium Kansasii and being followed by pulmonology for hemoptysis and her pulmonary disease. The patient noticed right arm swelling in spring of 2016. The patient had a venous duplex examination on 06/16/2015 which was negative for a right upper extremity DVT. The patient had a chest CT on 08/28/2015 which showed evidence for narrowing in the lower SVC but no evidence for venous collateralization. The patient feels like the right upper arm is swollen and she has some recurrent numbness along the back of the right upper arm which originally occurred after the chest surgery. She denies discoloration in the fingers, numbness in the fingers or change in her strength. She does have limited mobility in the right shoulder due to a subluxation and she has had rehabilitation for in the past. In addition, the patient feels like her head is swollen and "full of blood." The patient says that she has daily headaches and these can be debilitating. Patient also complains of recurrent dizziness. No new respiratory or chest complaints. No significant abdominal pain. Patient denies lower extremity swelling.  Past Medical History  Diagnosis Date  . COPD (chronic obstructive pulmonary disease) ([place])     per CT 11/10/05, will minimal bronchiectasis rll  . GERD (gastroesophageal reflux disease)   . Osteoporosis     dexa per gyn  . 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 Lichen planus   . B12 deficiency   . Endometriosis   . Cataracts, bilateral   . Arthritis   . Hemorrhoid   . Pneumothorax, spontaneous, tension     x  2 in her 20's.  has a chest tube for one  . Mycobacterium kansasii infection ([place]) 11/26/2012    declined to complete Rx 06-2013, s/p R lobectomy  . Bronchiectasis ([place])   . Shoulder dislocation 2015     R shoulder , x 2   . 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 Headache(784.0)     related to SVC syndrome    Past Surgical History  Procedure Laterality Date  . Knee surgery      .not replacement .x8  . Ovarian cyst removed    . Video bronchoscopy  02/15/2012    Procedure: VIDEO BRONCHOSCOPY WITH FLUORO;  Surgeon: [name], MD;  Location: [facility];  Service: Endoscopy;  Laterality: Bilateral;  WASHING- INTERVENTION BIOPSIES INTERVENTION BRONCHOSCOPY WITH VIDEO  . Mediastinoscopy  05/25/2012    Procedure: MEDIASTINOSCOPY;  Surgeon: [name], MD;  Location: [facility] OR;  Service: Thoracic;  Laterality: N/A;  . Tonsillectomy    . Flexible bronchoscopy  11/05/2012    Procedure: FLEXIBLE BRONCHOSCOPY;  Surgeon: [name], MD;  Location: [place];  Service: Thoracic;  Laterality: N/A;  . Thoracotomy  11/05/2012    Procedure: THORACOTOMY MAJOR;  Surgeon: [name], MD;  Location: [place];  Service: Thoracic;  Laterality: Right;  . Lobectomy  11/05/2012    Procedure: LOBECTOMY;  Surgeon: [name], MD;  Location: [facility] OR;  Service: Thoracic;  Laterality: Right;  right upper lobectomy, and part of the right lower lobe  . Esophagogastroduodenoscopy  12/03/2012    Procedure: ESOPHAGOGASTRODUODENOSCOPY (EGD);  Surgeon: [name], MD;  Location: [facility] ENDOSCOPY;  Service: Endoscopy;  Laterality: N/A;    Allergies: Alendronate sodium; Antihistamines, chlorpheniramine-type; Benadryl; Nsaids; Other; Pheniramine; Pseudoephedrine; Levofloxacin; and Robitussin a-c  Medications: Prior to Admission medications   Medication Sig Start Date End Date Taking? Authorizing Provider  acetaminophen (TYLENOL) 500 MG tablet Take 500 mg by mouth every 4 (four) hours as needed for  moderate pain or fever. For fever    Historical Provider, MD  ALPRAZolam Duanne Moron) 0.5 MG tablet Take 0.5-1 tablets (0.25-0.5 mg total) by mouth at bedtime as needed for sleep. 04/28/15   Colon Branch, MD  aspirin 81 MG chewable tablet Chew 81 mg by mouth daily.    Historical Provider, MD  beclomethasone (QVAR) 80 MCG/ACT inhaler Inhale 2 puffs into the lungs 2 (two) times daily.    Historical Provider, MD  Biotin 1000 MCG tablet Take 1,000 mcg by mouth 3 (three) times daily.    Historical Provider, MD  Cholecalciferol (VITAMIN D PO) Take 1 tablet by mouth daily.    Historical Provider, MD  HYDROcodone-acetaminophen (NORCO) 10-325 MG tablet Take 1 tablet by mouth every 6 (six) hours as needed. 09/17/15   Colon Branch, MD  LYSINE PO Take 1 tablet by mouth daily.    Historical Provider, MD  Magnesium 400 MG TABS Take 1 tablet by mouth daily.    Historical Provider, MD  methocarbamol (ROBAXIN) 500 MG tablet Take 1 tablet (500 mg total) by mouth every 8 (eight) hours as needed for muscle spasms. Patient taking differently: Take 250 mg by mouth every 8 (eight) hours as needed for muscle spasms.  01/02/15   [name], MD  nicotine polacrilex (NICORETTE) 4 MG lozenge Place 4 mg inside cheek every 3 (three) hours as needed for smoking cessation. For nicotine cravings.    Historical Provider, MD  pantoprazole (PROTONIX) 40 MG tablet TAKE 1 TABLET BY MOUTH TWICE DAILY BEFORE A MEAL 03/13/15   Alferd Apa Lowne, DO  tiotropium (SPIRIVA) 18 MCG inhalation capsule Place into inhaler and inhale. 01/23/15 01/23/16  Historical Provider, MD     Family History  Problem Relation Age of Onset  . Colon cancer Neg Hx   . Breast cancer Neg Hx   . Esophageal cancer Neg Hx   . Rectal cancer Neg Hx   . Stomach cancer Neg Hx   . Diabetes Mother     GM  . Heart disease Mother     in her 8s  . Heart disease Maternal Grandfather   . Cancer Father     sarcoma  . Diabetes Maternal Uncle   . Diabetes Maternal Grandmother      Social History   Social History  . Marital Status: Single    Spouse Name: N/A  . Number of Children: 0  . Years of Education: N/A   Occupational History  . 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 [place]   Social History Main Topics  . Smoking status: Current Every Day Smoker -- 0.40 packs/day for 38 years    Types: Cigarettes  . Smokeless tobacco: Never Used     Comment: < 1 ppd   . Alcohol Use: 4.2 oz/week    7 Glasses of wine per week  . Drug Use: No  . Sexual Activity: Not Currently     Comment: 1st intercourse 59 yo-More than 5 partners   Other Topics Concern  .  Not on file   Social History Narrative   sister lives w/ pt    Lost mom 12-2013       Review of Systems  Respiratory: Negative.   Cardiovascular: Negative.   Gastrointestinal: Negative.   Neurological: Positive for dizziness and headaches.    Vital Signs: BP 113/68 mmHg  Pulse 89  Temp(Src) 98.7 F (37.1 C)  Resp 16  SpO2 98%  LMP 11/05/2004  Physical Exam  Constitutional: She is oriented to person, place, and time. She appears well-developed and well-nourished.  Cardiovascular: Normal rate, regular rhythm and normal heart sounds.   Palpable radial pulses bilaterally.  Pulmonary/Chest: Effort normal and breath sounds normal.  Healed right thoracotomy incision.    Abdominal: Soft. She exhibits no distension. There is no tenderness.  Musculoskeletal:  Minimal soft tissue fullness in right shoulder compared to left.  No significant neck swelling. Right arm is soft and not significantly swollen.  Neurological: She is alert and oriented to person, place, and time.  Equal strength in both upper extremities. No sensory deficits in right upper extremity.  Psychiatric: She has a normal mood and affect.        Imaging: CT Chest 08/28/15  CLINICAL DATA: Cough, chest swelling, history of mycobacterial infection status post lobectomy, intermittent low grade fever,  prior pneumothorax  EXAM: CT CHEST WITH CONTRAST  TECHNIQUE: Multidetector CT imaging of the chest was performed during intravenous contrast administration.  CONTRAST: 56mL ISOVUE-300 IOPAMIDOL (ISOVUE-300) INJECTION 61%  COMPARISON: CTA chest dated 06/16/2014  FINDINGS: Mediastinum/Nodes: Heart is normal in size. No pericardial effusion.  Mild atherosclerotic calcifications of the aortic arch.  No suspicious mediastinal or axillary lymphadenopathy.  Visualized thyroid is unremarkable.  Lungs/Pleura: Status post right upper lobectomy.  Volume loss/scarring in the residual right middle lobe.  8.0 x 6.7 cm cavitary lesion in the right upper hemithorax with associated 3.0 x 3.4 cm soft tissue inferiorly (series 4/ image 18) an additional peripheral nodularity (for example, series 4/image 16). While this appearance could still reflect atypical microbacterial or fungal infection, given the patient's clinical history, this is worrisome for recurrent squamous cell neoplasm.  Left lung is clear. No focal consolidation.  Mild to moderate paraseptal emphysematous changes.  No pleural effusion or pneumothorax.  Upper abdomen: Visualized upper abdomen is notable for vascular calcifications.  Musculoskeletal: Visualized osseous structures are within normal limits.  IMPRESSION: Status post right upper lobectomy.  Irregular 8.0 x 6.7 cm cavitary lesion in the right hemithorax with associated soft tissue inferiorly, worrisome for recurrent squamous cell neoplasm, less likely atypical mycobacterial or fungal infection.  Consider bronchoscopy and/or percutaneous sampling.  These results will be called to the ordering clinician or representative by the Radiologist Assistant, and communication documented in the PACS or zVision Dashboard.  Electronically Signed: By: Julian Hy M.D. On: 08/28/2015 14:59        Labs:  CBC:  Recent  Labs  03/04/15 1039 06/15/15 1302  WBC 8.4 8.3  HGB 13.7 14.6  HCT 40.6 43.3  PLT 375.0 439.0*    COAGS: No results for input(s): INR, APTT in the last 8760 hours.  BMP:  Recent Labs  03/04/15 1039 06/15/15 1302  NA 138 135  K 3.9 3.5  CL 105 95*  CO2 30 28  GLUCOSE 99 93  BUN 8 7  CALCIUM 9.9 10.1  CREATININE 0.74 0.78    LIVER FUNCTION TESTS:  Recent Labs  03/04/15 1039 06/15/15 1302  BILITOT 0.4 0.6  AST 19 18  ALT  15 14  ALKPHOS 40 44  PROT 7.7 8.1  ALBUMIN 4.8 5.2    TUMOR MARKERS: No results for input(s): AFPTM, CEA, CA199, CHROMGRNA in the last 8760 hours.  Assessment and Plan:  59 year old with previous right thoracotomy and presents with headache, dizziness and right upper extremity swelling. The right upper extremity swelling is not impressive. The right upper extremity is soft, nontender and minimal asymmetry compared to the last. No significant neck swelling or abnormal vein distention. I personally reviewed the chest CT from 08/28/2015. The right subclavian vein is not opacified with contrast on this examination and, therefore, right subclavian vein stenosis cannot be evaluated. 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 The apparent subclavian artery stenosis may be artifact from positioning of the arm and this does not appear to be clinically significant based on the patient's symptoms or physical examination. The lower SVC does appear to be smaller compared to the CT examination from 06/16/2014 but there is no significant collateralization in the mediastinum or upper chest to suggest a hemodynamically significant stenosis. In addition, the patient's physical examination is not overly impressive for SVC syndrome but feel that it needs to be ruled out based on the patient's symptoms of dizziness, headaches and feeling like her head is  full of blood.   Plan: I reviewed the patient's physical exam findings and imaging with the patient in depth. I explained that the described 50% stenosis was referring to the subclavian artery and not the vein. Patient understands that we do not have enough information to know if there is truly any subclavian vein stenosis. I do agree that there appears to be narrowing of SVC but not sure if it's clinically significant. Therefore, we will schedule the patient for an outpatient right upper extremity venogram which will evaluate the right subclavian vein and also evaluate the SVC. Following the venogram, we will decide if any intervention, such as angioplasty, is needed or appropriate. The patient seemed very comfortable and happy with this approach. Patient is scheduled to have the right upper extremity venogram at [facility] on Thursday morning November 3.  Thank you for this interesting consult.  I greatly enjoyed meeting [name] [name] and look forward to participating in their care.  A copy of this report was sent to the requesting provider on this date.  SignedCarylon Perches 10/06/2015, 9:10 AM   I spent a total of  30 Minutes  in face to face in clinical consultation, greater than 50% of which was counseling/coordinating care for upper extremity swelling.

## 2015-10-08 ENCOUNTER — Other Ambulatory Visit (HOSPITAL_COMMUNITY): Payer: Self-pay | Admitting: Diagnostic Radiology

## 2015-10-08 ENCOUNTER — Ambulatory Visit (HOSPITAL_COMMUNITY)
Admission: RE | Admit: 2015-10-08 | Discharge: 2015-10-08 | Disposition: A | Discharge status: Home / Self Care | Payer: 59 | Source: Ambulatory Visit | Attending: Diagnostic Radiology | Admitting: Diagnostic Radiology

## 2015-10-08 DIAGNOSIS — I82B11 Acute embolism and thrombosis of right subclavian vein: Secondary | ICD-10-CM

## 2015-10-08 DIAGNOSIS — M7989 Other specified soft tissue disorders: Secondary | ICD-10-CM | POA: Insufficient documentation

## 2015-10-08 MED ORDER — LIDOCAINE HCL 1 % IJ SOLN
INTRAMUSCULAR | Status: AC
  Filled 2015-10-08: qty 20

## 2015-10-08 MED ORDER — IOHEXOL 300 MG/ML  SOLN
50.0000 mL | Freq: Once | INTRAMUSCULAR | Status: DC | PRN
  Administered 2015-10-08: 30 mL via INTRAVENOUS
  Filled 2015-10-08: qty 50

## 2015-10-08 NOTE — Procedures (Signed)
Micropuncture catheter placed in right basilic vein and right upper extremity venogram performed.  No significant stenosis in right subclavian vein or SVC.  Catheter removed at end of procedure.

## 2015-10-08 NOTE — Progress Notes (Signed)
Patient had a right upper extremity and central venogram to exclude SVC syndrome.  The venogram is essentially normal.  No significant stenosis and no venous collaterals to suggest hemodynamically significant narrowing.  Minimal asymmetry of the arm blood pressures (right 119/74 and left 130/77 mmHg).  The CT scan raised concern for 50% narrowing in right subclavian artery but patient does not have symptoms for arterial stenosis.  I explained to the patient that the SVC narrowing/tapering is probably not causing her symptoms and would not recommend any SVC intervention at this time.  The patient has other potential causes for the mild arm swelling including previous right thoracotomy and right shoulder subluxation.

## 2015-11-24 ENCOUNTER — Encounter: Payer: Self-pay | Admitting: Internal Medicine

## 2015-11-24 ENCOUNTER — Ambulatory Visit (INDEPENDENT_AMBULATORY_CARE_PROVIDER_SITE_OTHER): Payer: 59 | Admitting: Internal Medicine

## 2015-11-24 VITALS — BP 126/78 | HR 91 | Temp 97.8°F | Ht 66.0 in | Wt 115.5 lb

## 2015-11-24 DIAGNOSIS — I871 Compression of vein: Secondary | ICD-10-CM | POA: Diagnosis not present

## 2015-11-24 DIAGNOSIS — Z09 Encounter for follow-up examination after completed treatment for conditions other than malignant neoplasm: Secondary | ICD-10-CM | POA: Insufficient documentation

## 2015-11-24 DIAGNOSIS — M150 Primary generalized (osteo)arthritis: Secondary | ICD-10-CM | POA: Diagnosis not present

## 2015-11-24 DIAGNOSIS — M159 Polyosteoarthritis, unspecified: Secondary | ICD-10-CM

## 2015-11-24 DIAGNOSIS — G4700 Insomnia, unspecified: Secondary | ICD-10-CM | POA: Diagnosis not present

## 2015-11-24 MED ORDER — HYDROCODONE-ACETAMINOPHEN 10-325 MG PO TABS: 1.0000 | ORAL_TABLET | Freq: Four times a day (QID) | ORAL | Status: DC | PRN

## 2015-11-24 MED ORDER — PANTOPRAZOLE SODIUM 40 MG PO TBEC: 40.0000 mg | DELAYED_RELEASE_TABLET | Freq: Two times a day (BID) | ORAL | Status: DC

## 2015-11-24 NOTE — Assessment & Plan Note (Signed)
Insomnia: Refill Xanax as needed SVC:  follow-up by Dr. Olena Heckle DJD: Refill pain meds: GERD: RF PPIs RTC 6 months

## 2015-11-24 NOTE — Patient Instructions (Signed)
Before you leave the office:   Schedule a complete physical exam to be done in 6 months Please be fasting

## 2015-11-24 NOTE — Progress Notes (Signed)
Subjective:    Patient ID: [name] [name], female    DOB: 1956/11/07, 59 y.o.   MRN: [id number]  DOS:  11/24/2015 Type of visit - description : routine office visit Interval history: Since the last time she was here, she was diagnosed with SVC by Dr. Olena Heckle, multiple x-rays done last was MRI/MRA done few weeks ago showing a question of narrowing of the arm veins, right side. They're planning to do a CTA. GERD: Symptoms well-controlled, needs a refill Insomnia: Xanax as needed, working well DJD: Needs a prescription for pain medication.    Review of Systems No Fever and chills. Weight stable. No nausea, vomiting, diarrhea. She still has occasional hemoptysis, not a new issue.  Past Medical History  Diagnosis Date  . COPD (chronic obstructive pulmonary disease) ([place])     per CT 11/10/05, will minimal bronchiectasis rll  . GERD (gastroesophageal reflux disease)   . Osteoporosis     dexa per gyn  . 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 Lichen planus   . B12 deficiency   . Endometriosis   . Cataracts, bilateral   . Arthritis   . Hemorrhoid   . Pneumothorax, spontaneous, tension     x 2 in her 20's.  has a chest tube for one  . 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 Bronchiectasis ([place])   . Shoulder dislocation 2015     R shoulder , x 2   . 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 Headache(784.0)     related to SVC syndrome    Past Surgical History  Procedure Laterality Date  . Knee surgery      .not replacement .x8  . Ovarian cyst removed    . Video bronchoscopy  02/15/2012    Procedure: VIDEO BRONCHOSCOPY WITH FLUORO;  Surgeon: [name], MD;  Location: [facility];  Service: Endoscopy;  Laterality: Bilateral;  WASHING- INTERVENTION BIOPSIES INTERVENTION BRONCHOSCOPY WITH VIDEO  . Mediastinoscopy  05/25/2012    Procedure: MEDIASTINOSCOPY;  Surgeon: [name], MD;   Location: [facility] OR;  Service: Thoracic;  Laterality: N/A;  . Tonsillectomy    . Flexible bronchoscopy  11/05/2012    Procedure: FLEXIBLE BRONCHOSCOPY;  Surgeon: [name], MD;  Location: [place];  Service: Thoracic;  Laterality: N/A;  . Thoracotomy  11/05/2012    Procedure: THORACOTOMY MAJOR;  Surgeon: [name], MD;  Location: [place];  Service: Thoracic;  Laterality: Right;  . Lobectomy  11/05/2012    Procedure: LOBECTOMY;  Surgeon: [name], MD;  Location: [facility] OR;  Service: Thoracic;  Laterality: Right;  right upper lobectomy, and part of the right lower lobe  . Esophagogastroduodenoscopy  12/03/2012    Procedure: ESOPHAGOGASTRODUODENOSCOPY (EGD);  Surgeon: [name], MD;  Location: [facility] ENDOSCOPY;  Service: Endoscopy;  Laterality: N/A;    Social History   Social History  . Marital Status: Single    Spouse Name: N/A  . Number of Children: 0  . Years of Education: N/A   Occupational History  . 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 [employer]   Social History Main Topics  . Smoking status: Current Every Day Smoker -- 0.40 packs/day for 38 years    Types: Cigarettes  . Smokeless tobacco: Never Used     Comment: < 1 ppd   . Alcohol  Use: 4.2 oz/week    7 Glasses of wine per week  . Drug Use: No  . Sexual Activity: Not Currently     Comment: 1st intercourse 59 yo-More than 5 partners   Other Topics Concern  . Not on file   Social History Narrative   sister lives w/ pt    Lost mom 12-2013        Medication List       This list is accurate as of: 11/24/15  5:41 PM.  Always use your most recent med list.               acetaminophen 500 MG tablet  Commonly known as:  TYLENOL  Take 500 mg by mouth every 4 (four) hours as needed for moderate pain or fever. For fever     ALPRAZolam 0.5 MG tablet  Commonly known as:  XANAX  Take 0.5-1 tablets (0.25-0.5 mg total) by mouth at bedtime as needed for sleep.     aspirin 81 MG chewable tablet  Chew 81 mg by  mouth daily.     beclomethasone 80 MCG/ACT inhaler  Commonly known as:  QVAR  Inhale 2 puffs into the lungs 2 (two) times daily.     Biotin 1000 MCG tablet  Take 1,000 mcg by mouth 3 (three) times daily.     HYDROcodone-acetaminophen 10-325 MG tablet  Commonly known as:  NORCO  Take 1 tablet by mouth every 6 (six) hours as needed.     HYDROcodone-acetaminophen 10-325 MG tablet  Commonly known as:  NORCO  Take 1 tablet by mouth every 6 (six) hours as needed.     LYSINE PO  Take 1 tablet by mouth daily.     Magnesium 400 MG Tabs  Take 1 tablet by mouth daily.     methocarbamol 500 MG tablet  Commonly known as:  ROBAXIN  Take 1 tablet (500 mg total) by mouth every 8 (eight) hours as needed for muscle spasms.     NICORETTE 4 MG lozenge  Generic drug:  nicotine polacrilex  Place 4 mg inside cheek every 3 (three) hours as needed for smoking cessation. For nicotine cravings.     pantoprazole 40 MG tablet  Commonly known as:  PROTONIX  Take 1 tablet (40 mg total) by mouth 2 (two) times daily before a meal.     tiotropium 18 MCG inhalation capsule  Commonly known as:  [place] into inhaler and inhale.     VITAMIN D PO  Take 1 tablet by mouth daily.           Objective:   Physical Exam BP 126/78 mmHg  Pulse 91  Temp(Src) 97.8 F (36.6 C) (Oral)  Ht 5\' 6"  (1.676 m)  Wt 115 lb 8 oz (52.39 kg)  BMI 18.65 kg/m2  SpO2 97%  LMP 11/05/2004 General:   Well developed,slightly under weight appearing, no distress. HEENT:  Normocephalic . Face symmetric, atraumatic Lungs:  Decreased breath sounds Normal respiratory effort, no intercostal retractions, no accessory muscle use. Heart: RRR,  no murmur.  No pretibial edema bilaterally  MSK: Arms measured at the bicipital area, right is larger by a half inch in circumference. Neurologic:  alert & oriented X3.  Speech normal Psych--  Cognition and judgment appear intact.  Cooperative with normal attention span and  concentration.  Behavior appropriate. No anxious or depressed appearing.      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 Olena Heckle DJD: Refill pain meds: GERD: RF PPIs RTC 6 months

## 2015-11-24 NOTE — Progress Notes (Signed)
Pre visit review using our clinic review tool, if applicable. No additional management support is needed unless otherwise documented below in the visit note. 

## 2015-12-01 ENCOUNTER — Telehealth: Payer: 59 | Admitting: Nurse Practitioner

## 2015-12-01 DIAGNOSIS — R05 Cough: Secondary | ICD-10-CM | POA: Diagnosis not present

## 2015-12-01 DIAGNOSIS — R059 Cough, unspecified: Secondary | ICD-10-CM

## 2015-12-01 MED ORDER — BENZONATATE 100 MG PO CAPS: 100.0000 mg | ORAL_CAPSULE | Freq: Three times a day (TID) | ORAL | Status: DC | PRN

## 2015-12-01 MED ORDER — DOXYCYCLINE HYCLATE 100 MG PO TABS: 100.0000 mg | ORAL_TABLET | Freq: Two times a day (BID) | ORAL | Status: DC

## 2015-12-01 NOTE — Progress Notes (Signed)
We are sorry that you are not feeling well.  Here is how we plan to help!  Based on what you have shared with me it looks like you have upper respiratory tract inflammation that has resulted in a significant cough.  Inflammation and infection in the upper respiratory tract is commonly called bronchitis and has four common causes:  Allergies, Viral Infections, Acid Reflux and Bacterial Infections.  Allergies, viruses and acid reflux are treated by controlling symptoms or eliminating the cause. An example might be a cough caused by taking certain blood pressure medications. You stop the cough by changing the medication. Another example might be a cough caused by acid reflux. Controlling the reflux helps control the cough.  Based on your presentation I believe you most likely have A cough due to bacteria.  When patients have a fever and a productive cough with a change in color or increased sputum production, we are concerned about bacterial bronchitis.  If left untreated it can progress to pneumonia.  If your symptoms do not improve with your treatment plan it is important that you contact your provider.   I hve prescribed Doxycycline 100 mg twice a day for 7 days    In addition you may use A prescription cough medication called Tessalon Perles 100mg . You may take 1-2 capsules every 8 hours as needed for your cough.    HOME CARE . Only take medications as instructed by your medical team. . Complete the entire course of an antibiotic. . Drink plenty of fluids and get plenty of rest. . Avoid close contacts especially the very young and the elderly . Cover your mouth if you cough or cough into your sleeve. . Always remember to wash your hands . A steam or ultrasonic humidifier can help congestion.    GET HELP RIGHT AWAY IF: . You develop worsening fever. . You become short of breath . You cough up blood. . Your symptoms persist after you have completed your treatment plan MAKE SURE YOU    Understand these instructions.  Will watch your condition.  Will get help right away if you are not doing well or get worse.  Your e-visit answers were reviewed by a board certified advanced clinical practitioner to complete your personal care plan.  Depending on the condition, your plan could have included both over the counter or prescription medications. If there is a problem please reply  once you have received a response from your provider. Your safety is important to [place].  If you have drug allergies check your prescription carefully.    You can use MyChart to ask questions about today's visit, request a non-urgent call back, or ask for a work or school excuse for 24 hours related to this e-Visit. If it has been greater than 24 hours you will need to follow up with your provider, or enter a new e-Visit to address those concerns. You will get an e-mail in the next two days asking about your experience.  I hope that your e-visit has been valuable and will speed your recovery. Thank you for using e-visits.

## 2015-12-14 ENCOUNTER — Other Ambulatory Visit: Payer: Self-pay | Admitting: Internal Medicine

## 2015-12-15 MED FILL — ALPRAZolam 0.5 MG TABS: 0.5 | 90 days supply | Qty: 90 | Fill #0

## 2015-12-15 NOTE — Telephone Encounter (Signed)
Pt is requesting refill on Alprazolam.  Last OV: 11/24/2015 Last Fill: 04/28/2015 #90 and 1RF Pt sig: 1/2-1 tablet qhs PRN UDS: 04/07/2015 Low risk  Please advise.

## 2015-12-15 NOTE — Telephone Encounter (Signed)
Rx printed, awaiting MD signature.  

## 2015-12-15 NOTE — Telephone Encounter (Signed)
Ok 90, 1 RF

## 2015-12-15 NOTE — Telephone Encounter (Signed)
Rx faxed to [facility].  

## 2015-12-24 MED FILL — METHOCARBAMOL 500 MG TABLET: 500 | 30 days supply | Qty: 90 | Fill #1

## 2015-12-28 MED FILL — QVAR 80 MCG ORAL INHALER: 80 | 30 days supply | Qty: 9 | Fill #0

## 2015-12-30 MED FILL — HYDROCODON-APAP 10-325: 10-325 | 22 days supply | Qty: 90 | Fill #0

## 2016-01-04 ENCOUNTER — Encounter: Payer: Self-pay | Admitting: Family Medicine

## 2016-01-04 ENCOUNTER — Ambulatory Visit (INDEPENDENT_AMBULATORY_CARE_PROVIDER_SITE_OTHER): Payer: 59 | Admitting: Family Medicine

## 2016-01-04 VITALS — BP 132/86 | HR 79 | Ht 67.0 in | Wt 115.0 lb

## 2016-01-04 DIAGNOSIS — S4991XD Unspecified injury of right shoulder and upper arm, subsequent encounter: Secondary | ICD-10-CM

## 2016-01-04 DIAGNOSIS — M25511 Pain in right shoulder: Secondary | ICD-10-CM

## 2016-01-04 MED ORDER — METHYLPREDNISOLONE ACETATE 40 MG/ML IJ SUSP
40.0000 mg | Freq: Once | INTRAMUSCULAR | Status: AC
  Administered 2016-01-04: 40 mg via INTRA_ARTICULAR

## 2016-01-04 NOTE — Patient Instructions (Signed)
We went ahead with a subacromial cortisone injection today. Restart the theraband strengthening exercises and scapular strengthening in a few days. Call us in 1-2 weeks to let us know how you're doing. This is a challenging case.  I'm not 100% convinced we will be able to identify why you're getting pain in your right shoulder, upper arm with weakness in this arm and swelling from an orthopedic standpoint. I've not seen a case of thoracic outlet syndrome caused by an issue that hasn't been assessed with the MR arthrogram though this is possible (they typically respond to physical therapy though). If not improving I can think of only two imaging modalities that might be helpful - MRI of the shoulder, MRI of the brain (to assess the motor cortex for something creating the weakness in the left arm).

## 2016-01-12 NOTE — Assessment & Plan Note (Signed)
we had a long discussion regarding her prior workup for right upper extremity swelling, pain which has been extensive to date.  There was question about possible venous compression from scar tissue of prior right shoulder dislocation with consideration made to doing an MRI of her shoulder.  This would be very unusual though not an impossible finding.  However, treatment would be conservative for this with physical therapy, home exercises.  I did not appreciate any swelling today though she has some mild weakness throughout the arm.  For concurrent rotator cuff impingement we gave subacromial injection today.  We discussed only other possibilities moving forward to investigate would be to do the shoulder MRI or brain MRI to assess for left sided lesion accounting for right upper extremity weakness which would be unusual also.  She will call us in 1-2 weeks to let us know how she's doing.  After informed written consent, patient was seated on exam table. Right shoulder was prepped with alcohol swab and utilizing posterior approach, patient's right subacromial space was injected with 3:1 marcaine: depomedrol. Patient tolerated the procedure well without immediate complications.

## 2016-01-12 NOTE — Progress Notes (Signed)
PCP: Kathlene November, MD  Subjective:   HPI: Patient is a 60 y.o. female here for right shoulder injury.  1/29: Patient reports first injury to right shoulder occurred 10/16/2013. She was walking to her car in a concrete parking deck when she tripped and fell - sustained an avulsion fracture of her hip and an injury to her right shoulder. Was in the hospital for this - there was debate however on what she did to her shoulder - initially told she had dislocated it but orthopedics disagreed with this. She reports when they were doing her x-rays of her shoulder it felt like the shoulder 'popped back in' She did 4 months of PT and 1 month of home exercises but never felt like the shoulder got back to 100%. Never had an MRI of this shoulder but was given 5% disability for it. Has had some clicking and feelings of shoulder sticking. Then on 1/23 she jerked her arm forward when washing dishes and it felt like shoulder came out of socket. Made her nauseous and dizzy. Has been icing. Prior orthopedist would not see her - no records currently available. Limited use of this arm since accident 1/23.  3/11: Patient reports she's doing much better. Doing physical therapy and home exercises. No pain currently. Happy with her progress to date. No feelings of instability.  03/27/15: Patient reports she feels a lot better - about 70%. Motion feels good. Still doing PT and home exercises. Pain up to 4/10 at times. Has on a side note had some right sided back, rib cage to arm pain. Wasn't sure if this was related to prior chest tube placement. Doesn't do any specific strengthening of low back currently. Gets spasms in this area.  01/04/16: Patient returns with continued right shoulder pain, right arm swelling. She reports pain at a 2/10 currently, dull. Some pain with overhead motions. She had seen her pulmonologist in September with possible SVC syndrome. She had a low grade fever with right side and  arm swelling, headache, head congestion.   Associated numbness under right arm also. She had RUE ultrasound negative for DVT, Chest x-ray with chronic changes and severe COPD. Chest CT with contrast read initially as cavitary lesion 8 x 6.7cm right hemithorax - possible fungal infection superimposed on preexisting cavitary lesion.  Mild narrowing but patent lower SVC. Outside read of this CT - sequela of right upper lobectomy, stable right apical bullae. MRI chest with and without contrast showed patent RIJ, subclavian, brachiocephalic veins, SVC. She had a venogram which showed no hemodynamic stenosis of right upper extremity, central veins.  SVC tapers but flow is not diminished. No skin changes, current fever, other complaints.  Past Medical History  Diagnosis Date  . COPD (chronic obstructive pulmonary disease) ([place])     per CT 11/10/05, will minimal bronchiectasis rll  . GERD (gastroesophageal reflux disease)   . Osteoporosis     dexa per gyn  . 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 Lichen planus   . B12 deficiency   . Endometriosis   . Cataracts, bilateral   . Arthritis   . Hemorrhoid   . Pneumothorax, spontaneous, tension     x 2 in her 20's.  has a chest tube for one  . 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 Bronchiectasis ([place])   . Shoulder dislocation 2015     R shoulder , x 2   .  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 Headache(784.0)     related to SVC syndrome    Current Outpatient Prescriptions on File Prior to Visit  Medication Sig Dispense Refill  . acetaminophen (TYLENOL) 500 MG tablet Take 500 mg by mouth every 4 (four) hours as needed for moderate pain or fever. For fever    . ALPRAZolam (XANAX) 0.5 MG tablet Take 0.5-1 tablets (0.25-0.5 mg total) by mouth at bedtime as needed for sleep. 90 tablet 1  . aspirin 81 MG chewable tablet Chew 81 mg by mouth daily.    .  beclomethasone (QVAR) 80 MCG/ACT inhaler Inhale 2 puffs into the lungs 2 (two) times daily.    . benzonatate (TESSALON PERLES) 100 MG capsule Take 1 capsule (100 mg total) by mouth 3 (three) times daily as needed for cough. 20 capsule 0  . Biotin 1000 MCG tablet Take 1,000 mcg by mouth 3 (three) times daily.    . Cholecalciferol (VITAMIN D PO) Take 1 tablet by mouth daily.    Marland Kitchen doxycycline (VIBRA-TABS) 100 MG tablet Take 1 tablet (100 mg total) by mouth 2 (two) times daily. 1 po bid 20 tablet 0  . HYDROcodone-acetaminophen (NORCO) 10-325 MG tablet Take 1 tablet by mouth every 6 (six) hours as needed. 90 tablet 0  . HYDROcodone-acetaminophen (NORCO) 10-325 MG tablet Take 1 tablet by mouth every 6 (six) hours as needed. 90 tablet 0  . LYSINE PO Take 1 tablet by mouth daily.    . Magnesium 400 MG TABS Take 1 tablet by mouth daily.    . methocarbamol (ROBAXIN) 500 MG tablet Take 1 tablet (500 mg total) by mouth every 8 (eight) hours as needed for muscle spasms. (Patient taking differently: Take 250 mg by mouth every 8 (eight) hours as needed for muscle spasms. ) 90 tablet 1  . nicotine polacrilex (NICORETTE) 4 MG lozenge Place 4 mg inside cheek every 3 (three) hours as needed for smoking cessation. For nicotine cravings.    . pantoprazole (PROTONIX) 40 MG tablet Take 1 tablet (40 mg total) by mouth 2 (two) times daily before a meal. 60 tablet 12  . tiotropium (SPIRIVA) 18 MCG inhalation capsule Place into inhaler and inhale.     No current facility-administered medications on file prior to visit.    Past Surgical History  Procedure Laterality Date  . Knee surgery      .not replacement .x8  . Ovarian cyst removed    . Video bronchoscopy  02/15/2012    Procedure: VIDEO BRONCHOSCOPY WITH FLUORO;  Surgeon: [name], MD;  Location: [facility];  Service: Endoscopy;  Laterality: Bilateral;  WASHING- INTERVENTION BIOPSIES INTERVENTION BRONCHOSCOPY WITH VIDEO  . Mediastinoscopy  05/25/2012     Procedure: MEDIASTINOSCOPY;  Surgeon: [name], MD;  Location: [facility] OR;  Service: Thoracic;  Laterality: N/A;  . Tonsillectomy    . Flexible bronchoscopy  11/05/2012    Procedure: FLEXIBLE BRONCHOSCOPY;  Surgeon: [name], MD;  Location: [place];  Service: Thoracic;  Laterality: N/A;  . Thoracotomy  11/05/2012    Procedure: THORACOTOMY MAJOR;  Surgeon: [name], MD;  Location: [place];  Service: Thoracic;  Laterality: Right;  . Lobectomy  11/05/2012    Procedure: LOBECTOMY;  Surgeon: [name], MD;  Location: [facility] OR;  Service: Thoracic;  Laterality: Right;  right upper lobectomy, and part of the right lower lobe  . Esophagogastroduodenoscopy  12/03/2012    Procedure: ESOPHAGOGASTRODUODENOSCOPY (EGD);  Surgeon: [name], MD;  Location[facility] ENDOSCOPY;  Service: Endoscopy;  Laterality: N/A;    Allergies  Allergen Reactions  . Alendronate Sodium Other (See Comments)    chest pain  . Antihistamines, Chlorpheniramine-Type Other (See Comments)    "makes lungs bleed"  . Benadryl [Diphenhydramine] Other (See Comments)    "makes lungs bleed" (patient states she can tolerate the newer ones, like Claritin)  . Nsaids Other (See Comments)    ischemic colitis  . Other Other (See Comments)    (All anti-inflammatories) ischemic colitis  . Pheniramine Other (See Comments)    "makes lungs bleed"  . Pseudoephedrine Other (See Comments)    Ischemic colitis  . Levofloxacin Anxiety  . Robitussin A-C [Guaifenesin-Codeine] Itching and Other (See Comments)    Palms itching    Social History   Social History  . Marital Status: Single    Spouse Name: N/A  . Number of Children: 0  . Years of Education: N/A   Occupational History  . 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 [employer]   Social History Main Topics  . Smoking status: Current Every Day Smoker -- 0.40 packs/day for 38 years    Types: Cigarettes  . Smokeless tobacco: Never Used     Comment: < 1 ppd   . Alcohol Use:  4.2 oz/week    7 Glasses of wine per week  . Drug Use: No  . Sexual Activity: Not Currently     Comment: 1st intercourse 60 yo-More than 5 partners   Other Topics Concern  . Not on file   Social History Narrative   sister lives w/ pt    Lost mom 12-2013    Family History  Problem Relation Age of Onset  . Colon cancer Neg Hx   . Breast cancer Neg Hx   . Esophageal cancer Neg Hx   . Rectal cancer Neg Hx   . Stomach cancer Neg Hx   . Diabetes Mother     GM  . Heart disease Mother     in her 17s  . Heart disease Maternal Grandfather   . Cancer Father     sarcoma  . Diabetes Maternal Uncle   . Diabetes Maternal Grandmother     BP 132/86 mmHg  Pulse 79  Ht 5\' 7"  (1.702 m)  Wt 115 lb (52.164 kg)  BMI 18.01 kg/m2  LMP 11/05/2004  Review of Systems: See HPI above.    Objective:  Physical Exam:  Gen: NAD  Right shoulder: No swelling, ecchymoses.  No gross deformity. Minimal anterior tenderness. FROM with mild painful arc. Negative Hawkins, Neers. Negative Speeds, Yergasons. Strength 5-/5 with empty can and resisted internal/external rotation, elbow extension.  No pain. Negative apprehension.    Left shoulder: FROM without pain.  Assessment & Plan:  1. Right shoulder pain - we had a long discussion regarding her prior workup for right upper extremity swelling, pain which has been extensive to date.  There was question about possible venous compression from scar tissue of prior right shoulder dislocation with consideration made to doing an MRI of her shoulder.  This would be very unusual though not an impossible finding.  However, treatment would be conservative for this with physical therapy, home exercises.  I did not appreciate any swelling today though she has some mild weakness throughout the arm.  For concurrent rotator cuff impingement we gave subacromial injection today.  We discussed only other possibilities moving forward  to investigate would be to do the  shoulder MRI or brain MRI to assess for left sided lesion accounting for right upper extremity weakness which would be unusual also.  She will call us in 1-2 weeks to let us know how she's doing.  After informed written consent, patient was seated on exam table. Right shoulder was prepped with alcohol swab and utilizing posterior approach, patient's right subacromial space was injected with 3:1 marcaine: depomedrol. Patient tolerated the procedure well without immediate complications.

## 2016-01-18 MED FILL — PANTOPRAZOLE SOD DR 40 MG T: 40 | 90 days supply | Qty: 180 | Fill #1

## 2016-01-19 ENCOUNTER — Ambulatory Visit (INDEPENDENT_AMBULATORY_CARE_PROVIDER_SITE_OTHER): Payer: 59 | Admitting: Internal Medicine

## 2016-01-19 ENCOUNTER — Encounter: Payer: Self-pay | Admitting: Internal Medicine

## 2016-01-19 VITALS — BP 136/84 | HR 100 | Temp 98.2°F | Ht 67.0 in | Wt 113.0 lb

## 2016-01-19 DIAGNOSIS — G8929 Other chronic pain: Secondary | ICD-10-CM

## 2016-01-19 DIAGNOSIS — Z09 Encounter for follow-up examination after completed treatment for conditions other than malignant neoplasm: Secondary | ICD-10-CM

## 2016-01-19 DIAGNOSIS — R519 Headache, unspecified: Secondary | ICD-10-CM

## 2016-01-19 DIAGNOSIS — R030 Elevated blood-pressure reading, without diagnosis of hypertension: Secondary | ICD-10-CM

## 2016-01-19 DIAGNOSIS — R51 Headache: Secondary | ICD-10-CM | POA: Diagnosis not present

## 2016-01-19 NOTE — Patient Instructions (Signed)
Will schedule a CT of the head  Check the  blood pressure 2 or 3 times a  Week  Be sure your blood pressure is between 110/65 and  145/85. If it is consistently higher or lower, let me know   For headaches: take robaxin ( the muscle relaxant you already have) anf tylenol  Think about see the neurologist  If you have another severe headache call or go to the ER If  the headache become more frequent or intense : call the office

## 2016-01-19 NOTE — Progress Notes (Signed)
Subjective:    Patient ID: [name] [name], female    DOB: [birth year], 58 y.o.   MRN: [id number]  DOS:  01/19/2016 Type of visit - description : Acute visit Interval history: She has an ongoing headache for about a year, 4 days ago for 24 hours had her usual  headache, similar features as before but the intensity was very high. Is described as the worst she  had in long time, associated with facial pressure, she admits that in a way sx were similar to the facial pressure she had related to the SVC >>> "like my face is going to explote  " Since then she started to check her BP: That day was 158/90, subsequently it ranged from [id number] with diastolic of 70 to [id number]. The headache is now back to baseline, dull, last several hours a day, at the top of the head   Review of Systems Has any fever chills Had 2 episodes of nausea in the last few days, no vomiting. No neck stiffness Denies any dizziness, diplopia, slurred speech, motor deficits  Past Medical History  Diagnosis Date  . COPD (chronic obstructive pulmonary disease) ([place])     per CT 11/10/05, will minimal bronchiectasis rll  . GERD (gastroesophageal reflux disease)   . Osteoporosis     dexa per gyn  . 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 Lichen planus   . B12 deficiency   . Endometriosis   . Cataracts, bilateral   . Arthritis   . Hemorrhoid   . Pneumothorax, spontaneous, tension     x 2 in her 20's.  has a chest tube for one  . 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 Bronchiectasis ([place])   . Shoulder dislocation 2015     R shoulder , x 2   . 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 Headache(784.0)     related to SVC syndrome    Past Surgical History  Procedure Laterality Date  . Knee surgery      .not replacement .x8  . Ovarian cyst removed    . Video bronchoscopy  02/15/2012    Procedure: VIDEO BRONCHOSCOPY WITH FLUORO;  Surgeon:  [name], MD;  Location: [facility];  Service: Endoscopy;  Laterality: Bilateral;  WASHING- INTERVENTION BIOPSIES INTERVENTION BRONCHOSCOPY WITH VIDEO  . Mediastinoscopy  05/25/2012    Procedure: MEDIASTINOSCOPY;  Surgeon: [name], MD;  Location[facility];  Service: Thoracic;  Laterality: N/A;  . Tonsillectomy    . Flexible bronchoscopy  11/05/2012    Procedure: FLEXIBLE BRONCHOSCOPY;  Surgeon: [name], MD;  Location: [place];  Service: Thoracic;  Laterality: N/A;  . Thoracotomy  11/05/2012    Procedure: THORACOTOMY MAJOR;  Surgeon: [name], MD;  Location: [employer];  Service: Thoracic;  Laterality: Right;  . Lobectomy  11/05/2012    Procedure: LOBECTOMY;  Surgeon: [name], MD;  Location: [facility] OR;  Service: Thoracic;  Laterality: Right;  right upper lobectomy, and part of the right lower lobe  . Esophagogastroduodenoscopy  12/03/2012    Procedure: ESOPHAGOGASTRODUODENOSCOPY (EGD);  Surgeon: [name], MD;  Location: [facility] END[facility];  Service: Endoscopy;  Laterality: N/A;    Social History   Social History  . Marital Status: Single    Spouse Name: N/A  . Number of Children: 0  . Years of Education: N/A   Occupational  History  . 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 [employer]   Social History Main Topics  . Smoking status: Current Every Day Smoker -- 0.40 packs/day for 38 years    Types: Cigarettes  . Smokeless tobacco: Never Used     Comment: < 1 ppd   . Alcohol Use: 4.2 oz/week    7 Glasses of wine per week  . Drug Use: No  . Sexual Activity: Not Currently     Comment: 1st intercourse 60 yo-More than 5 partners   Other Topics Concern  . Not on file   Social History Narrative   sister lives w/ pt    Lost mom 12-2013        Medication List       This list is accurate as of: 01/19/16 11:59 PM.  Always use your most recent med list.               acetaminophen 500 MG tablet  Commonly known as:  TYLENOL  Take 500 mg by mouth every 4 (four)  hours as needed for moderate pain or fever. For fever     ALPRAZolam 0.5 MG tablet  Commonly known as:  XANAX  Take 0.5-1 tablets (0.25-0.5 mg total) by mouth at bedtime as needed for sleep.     aspirin 81 MG chewable tablet  Chew 81 mg by mouth daily.     beclomethasone 80 MCG/ACT inhaler  Commonly known as:  QVAR  Inhale 2 puffs into the lungs 2 (two) times daily.     Biotin 1000 MCG tablet  Take 1,000 mcg by mouth 3 (three) times daily.     HYDROcodone-acetaminophen 10-325 MG tablet  Commonly known as:  NORCO  Take 1 tablet by mouth every 6 (six) hours as needed.     LYSINE PO  Take 1 tablet by mouth daily.     Magnesium 400 MG Tabs  Take 1 tablet by mouth daily.     methocarbamol 500 MG tablet  Commonly known as:  ROBAXIN  Take 1 tablet (500 mg total) by mouth every 8 (eight) hours as needed for muscle spasms.     NICORETTE 4 MG lozenge  Generic drug:  nicotine polacrilex  Place 4 mg inside cheek every 3 (three) hours as needed for smoking cessation. For nicotine cravings.     pantoprazole 40 MG tablet  Commonly known as:  PROTONIX  Take 1 tablet (40 mg total) by mouth 2 (two) times daily before a meal.     Potassium Gluconate 595 MG Caps  Take 1 capsule by mouth daily.     tiotropium 18 MCG inhalation capsule  Commonly known as:  [place] into inhaler and inhale.     VITAMIN D PO  Take 1 tablet by mouth daily.           Objective:   Physical Exam BP 136/84 mmHg  Pulse 100  Temp(Src) 98.2 F (36.8 C) (Oral)  Ht 5\' 7"  (1.702 m)  Wt 113 lb (51.256 kg)  BMI 17.69 kg/m2  SpO2 97%  LMP 11/05/2004 General:   Well developed, well nourished . NAD.  HEENT:  Normocephalic . Face symmetric, atraumatic . Face without puffiness. EOMI. Lungs:  Decreased breath sounds Normal respiratory effort, no intercostal retractions, no accessory muscle use. Heart: RRR,  no murmur.  No pretibial edema bilaterally  Skin: Not pale. Not jaundice MSK: Right arm  with minimal puffiness on exam, approximately. Neurologic:  alert & oriented X3.  Speech normal, gait at baseline and unassisted. DTRs and motor strength symmetric. Neck supple Psych--  Cognition and judgment appear intact.  Cooperative with normal attention span and concentration.  Behavior appropriate. No anxious or depressed appearing.      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 For now recommend to follow a low-salt diet, continue monitoring and let me know if it is consistently elevated Headache: Going on for several months, had a very intense episode 4 days ago, now back to baseline. I told her we need to rule out ICB, rx a CT of the head, she requested it to be @ [facility] not here.  Until CT done, I asked her to go to the ER if she has a severe headache. Declined a neurology referral consequently will recommend Robaxin and Tylenol as needed (tensional headache?) see instructions

## 2016-01-19 NOTE — Progress Notes (Signed)
Pre visit review using our clinic review tool, if applicable. No additional management support is needed unless otherwise documented below in the visit note. 

## 2016-01-20 NOTE — Assessment & Plan Note (Signed)
Elevated BP: BP slightly elevated, not the degree I would expect to cause headache. For now recommend to follow a low-salt diet, continue monitoring and let me know if it is consistently elevated Headache: Going on for several months, had a very intense episode 4 days ago, now back to baseline. I told her we need to rule out ICB, rx a CT of the head, she requested it to be @ [facility] not here.  Until CT done, I asked her to go to the ER if she has a severe headache. Declined a neurology referral consequently will recommend Robaxin and Tylenol as needed (tensional headache?) see instructions

## 2016-01-21 ENCOUNTER — Ambulatory Visit: Payer: Self-pay | Admitting: Internal Medicine

## 2016-01-22 ENCOUNTER — Ambulatory Visit: Payer: 59 | Admitting: Internal Medicine

## 2016-01-25 ENCOUNTER — Ambulatory Visit
Admission: RE | Admit: 2016-01-25 | Discharge: 2016-01-25 | Disposition: A | Discharge status: Home / Self Care | Payer: 59 | Source: Ambulatory Visit | Attending: Internal Medicine | Admitting: Internal Medicine

## 2016-01-25 DIAGNOSIS — R51 Headache: Secondary | ICD-10-CM | POA: Diagnosis not present

## 2016-02-01 ENCOUNTER — Telehealth: Payer: Self-pay | Admitting: Internal Medicine

## 2016-02-01 MED ORDER — HYDROCODONE-ACETAMINOPHEN 10-325 MG PO TABS: 1.0000 | ORAL_TABLET | Freq: Four times a day (QID) | ORAL | Status: DC | PRN

## 2016-02-01 NOTE — Telephone Encounter (Signed)
Pt is requesting refill on Hydrocodone.  Last OV: 01/19/2016 Last Fill: 11/24/2015 #90 and 0RF For January 2017 UDS: 04/07/2015 Low risk  Please advise.

## 2016-02-01 NOTE — Telephone Encounter (Signed)
Ok 2 prescriptions

## 2016-02-01 NOTE — Telephone Encounter (Signed)
Caller name:Docia Relationship to patient: Can be reached:[phone] Pharmacy:  Reason for call:Needs written rx for hydrocodone

## 2016-02-01 NOTE — Telephone Encounter (Signed)
Rx's for March and April 2017 printed, awaiting MD signature.

## 2016-02-01 NOTE — Telephone Encounter (Signed)
Please inform Pt that Rx is ready for pick up at the front desk at her convenience. Thank you.  

## 2016-02-03 MED FILL — HYDROCODON-APAP 10-325: 10-325 | 22 days supply | Qty: 90 | Fill #0

## 2016-02-26 MED FILL — QVAR 80 MCG ORAL INHALER: 80 | 30 days supply | Qty: 9 | Fill #1

## 2016-02-26 MED FILL — FLUCONAZOLE 150 MG TABLET: 150 | 4 days supply | Qty: 2 | Fill #1

## 2016-03-01 DIAGNOSIS — F17200 Nicotine dependence, unspecified, uncomplicated: Secondary | ICD-10-CM | POA: Diagnosis not present

## 2016-03-01 DIAGNOSIS — J42 Unspecified chronic bronchitis: Secondary | ICD-10-CM | POA: Diagnosis not present

## 2016-03-01 MED FILL — VENTOLIN HFA 90 MCG INHALER: 108 (90 BAS | 25 days supply | Qty: 18 | Fill #0

## 2016-03-10 MED FILL — HYDROCODON-APAP 10-325: 10-325 | 22 days supply | Qty: 90 | Fill #0

## 2016-04-15 ENCOUNTER — Telehealth: Payer: Self-pay | Admitting: Internal Medicine

## 2016-04-15 MED ORDER — HYDROCODONE-ACETAMINOPHEN 10-325 MG PO TABS: 1.0000 | ORAL_TABLET | Freq: Four times a day (QID) | ORAL | Status: DC | PRN

## 2016-04-15 NOTE — Telephone Encounter (Signed)
Pt informed that Rx's have been placed at front desk for pick up at her convenience.  

## 2016-04-15 NOTE — Telephone Encounter (Signed)
Pt is requesting refill on Hydrocodone.  Last OV: 01/19/2016 Last Fill: 02/01/2016 #90 and 0RF For March and April 2017 UDS: 04/07/2015 Low risk  Please advise.

## 2016-04-15 NOTE — Telephone Encounter (Signed)
Okay two prescription for May and June

## 2016-04-15 NOTE — Telephone Encounter (Signed)
Rx's for May and June 2017 printed, awaiting MD signature.

## 2016-04-15 NOTE — Telephone Encounter (Signed)
°  Relation to [id number] Call back [phone] Pharmacy:  Reason for call: pt is needing rxHYDROcodone-acetaminophen ([place]) 10-325 MG tablet , please call when available and ready for pick up.

## 2016-04-18 MED FILL — QVAR 80 MCG ORAL INHALER: 80 | 30 days supply | Qty: 9 | Fill #2

## 2016-04-18 MED FILL — ALPRAZolam 0.5 MG TABS: 0.5 | 90 days supply | Qty: 90 | Fill #1

## 2016-04-18 MED FILL — HYDROCODON-APAP 10-325: 10-325 | 22 days supply | Qty: 90 | Fill #0

## 2016-04-22 DIAGNOSIS — Z79891 Long term (current) use of opiate analgesic: Secondary | ICD-10-CM | POA: Diagnosis not present

## 2016-04-22 DIAGNOSIS — Z79899 Other long term (current) drug therapy: Secondary | ICD-10-CM | POA: Diagnosis not present

## 2016-04-25 ENCOUNTER — Ambulatory Visit (INDEPENDENT_AMBULATORY_CARE_PROVIDER_SITE_OTHER): Payer: 59 | Admitting: Internal Medicine

## 2016-04-25 ENCOUNTER — Encounter: Payer: Self-pay | Admitting: Internal Medicine

## 2016-04-25 VITALS — BP 126/74 | HR 76 | Temp 98.6°F | Ht 67.0 in | Wt 116.1 lb

## 2016-04-25 DIAGNOSIS — Z0000 Encounter for general adult medical examination without abnormal findings: Secondary | ICD-10-CM

## 2016-04-25 DIAGNOSIS — R739 Hyperglycemia, unspecified: Secondary | ICD-10-CM

## 2016-04-25 DIAGNOSIS — R300 Dysuria: Secondary | ICD-10-CM | POA: Diagnosis not present

## 2016-04-25 DIAGNOSIS — Z09 Encounter for follow-up examination after completed treatment for conditions other than malignant neoplasm: Secondary | ICD-10-CM

## 2016-04-25 LAB — BASIC METABOLIC PANEL
BUN: 9 mg/dL (ref 6–23)
CALCIUM: 10 mg/dL (ref 8.4–10.5)
CHLORIDE: 104 meq/L (ref 96–112)
CO2: 27 mEq/L (ref 19–32)
Creatinine, Ser: 0.73 mg/dL (ref 0.40–1.20)
GFR: 87.02 mL/min (ref >60.00)
Glucose, Bld: 96 mg/dL (ref 70–99)
POTASSIUM: 4.1 meq/L (ref 3.5–5.1)
SODIUM: 141 meq/L (ref 135–145)

## 2016-04-25 LAB — URINALYSIS, ROUTINE W REFLEX MICROSCOPIC
BILIRUBIN URINE: NEGATIVE
Ketones, ur: NEGATIVE
NITRITE: NEGATIVE
PH: 7 (ref 5.0–8.0)
Specific Gravity, Urine: <=1.005 — AB (ref 1.000–1.030)
TOTAL PROTEIN, URINE-UPE24: NEGATIVE
URINE GLUCOSE: NEGATIVE
UROBILINOGEN UA: 0.2 (ref 0.0–1.0)

## 2016-04-25 LAB — LIPID PANEL
CHOLESTEROL: 216 mg/dL — AB (ref 0–200)
HDL: 83.7 mg/dL (ref >39.00)
LDL Cholesterol: 115 mg/dL — ABNORMAL HIGH (ref 0–99)
NonHDL: 132.43
TRIGLYCERIDES: 85 mg/dL (ref 0.0–149.0)
Total CHOL/HDL Ratio: 3
VLDL: 17 mg/dL (ref 0.0–40.0)

## 2016-04-25 LAB — HEMOGLOBIN A1C: Hgb A1c MFr Bld: 5.8 % (ref 4.6–6.5)

## 2016-04-25 LAB — VITAMIN B12: VITAMIN B 12: 262 pg/mL (ref 211–911)

## 2016-04-25 LAB — TSH: TSH: 0.54 u[IU]/mL (ref 0.35–4.50)

## 2016-04-25 NOTE — Progress Notes (Signed)
Pre visit review using our clinic review tool, if applicable. No additional management support is needed unless otherwise documented below in the visit note. 

## 2016-04-25 NOTE — Patient Instructions (Addendum)
GO TO THE LAB : Get the blood work     GO TO THE FRONT DESK Schedule your next appointment for a  routine checkup 8 months, fasting   Limit acetaminophen to less 3000 mg a day. Keep in mind that hydrocodone comes with acetaminophen

## 2016-04-25 NOTE — Assessment & Plan Note (Addendum)
Td 2015 ;pneumonia shot 07, 2013 ; prevnar - 2015     Cscope 4-09 , 1 polyp (@ [place])  cscope 09-2014, + polyps, next per GI  PAP (-) 06-2915, [employer] [facility] MMGs--06-2015 neg  Diet, exercise, tobacco quitting discussed Labs--see orders    Diet-exercise discussed

## 2016-04-25 NOTE — Assessment & Plan Note (Signed)
Hyperglycemia: Check A1c Osteoporosis: risk od fractures and consequences  discussed ; offered dexa and treatment; pt will call if interested Insomnia, pain management: On Xanax and hydrocodone. UDS contract today. Limit acetaminophen intake, see AVS B12 deficiency: Not on supplements, check a B12. Dysuria: pt saw blood in the toilet paper, unsure where it came from, some dysuria now. Check a UA, urine culture. If she has any vaginal bleeding recommend to contact gynecology. RTC 8 months

## 2016-04-25 NOTE — Progress Notes (Signed)
Subjective:    Patient ID: [name] [name], female    DOB: May 21, 1956, 60 y.o.   MRN: [id number]  DOS:  04/25/2016 Type of visit - description : CPX Interval history: No major concerns, see review of systems   Review of Systems  Constitutional: No fever. No chills. No unexplained wt changes. No unusual sweats  HEENT: No dental problems, no ear discharge, no facial swelling, no voice changes. No eye discharge, no eye  redness , no  intolerance to light   Respiratory: Respiratory symptoms at baseline with occasional cough and sputum production  Cardiovascular: No CP, no leg swelling , no  Palpitations  GI: no nausea, no vomiting, no diarrhea , no  abdominal pain.  No blood in the stools. No dysphagia, no odynophagia    Endocrine: No polyphagia, no polyuria , no polydipsia  GU: no gross hematuria, difficulty urinating but a couple of days ago when she wiped and saw drops of blood in the toilet paper, does not know if it is from the urine system  or vagina  Musculoskeletal: No joint swellings or unusual aches or pains  Skin: No change in the color of the skin, palor , no  Rash  Allergic, immunologic: No environmental allergies , no  food allergies  Neurological: No dizziness no  syncope.  Occasional headaches, at baseline. No diplopia, no slurred, no slurred speech, no motor deficits, no facial  Numbness  Hematological: No enlarged lymph nodes, no easy bruising , no unusual bleedings  Psychiatry: No suicidal ideas, no hallucinations, no beavior problems, no confusion.  No unusual/severe anxiety, no depression  Past Medical History  Diagnosis Date  . COPD (chronic obstructive pulmonary disease) ([place])     per CT 11/10/05, will minimal bronchiectasis rll  . GERD (gastroesophageal reflux disease)   . Osteoporosis     dexa per gyn  . 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 Lichen planus   . B12 deficiency   . Endometriosis   . Cataracts, bilateral   . Arthritis   .  Hemorrhoid   . Pneumothorax, spontaneous, tension     x 2 in her 20's.  has a chest tube for one  . 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 Bronchiectasis ([place])   . Shoulder dislocation 2015     R shoulder , x 2   . 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 Headache(784.0)     related to SVC syndrome    Past Surgical History  Procedure Laterality Date  . Knee surgery      .not replacement .x8  . Ovarian cyst removed    . Video bronchoscopy  02/15/2012    Procedure: VIDEO BRONCHOSCOPY WITH FLUORO;  Surgeon: [name], MD;  Location: [facility];  Service: Endoscopy;  Laterality: Bilateral;  WASHING- INTERVENTION BIOPSIES INTERVENTION BRONCHOSCOPY WITH VIDEO  . Mediastinoscopy  05/25/2012    Procedure: MEDIASTINOSCOPY;  Surgeon: [name], MD;  Location: [facility] OR;  Service: Thoracic;  Laterality: N/A;  . Tonsillectomy    . Flexible bronchoscopy  11/05/2012    Procedure: FLEXIBLE BRONCHOSCOPY;  Surgeon: [name], MD;  Location: [place];  Service: Thoracic;  Laterality: N/A;  . Thoracotomy  11/05/2012    Procedure: THORACOTOMY MAJOR;  Surgeon: [name], MD;  Location: [facility] OR;  Service: Thoracic;  Laterality: Right;  . Lobectomy  11/05/2012  Procedure: LOBECTOMY;  Surgeon: [name], MD;  Location: [facility] OR;  Service: Thoracic;  Laterality: Right;  right upper lobectomy, and part of the right lower lobe  . Esophagogastroduodenoscopy  12/03/2012    Procedure: ESOPHAGOGASTRODUODENOSCOPY (EGD);  Surgeon: [name], MD;  Location: [facility] ENDOSCOPY;  Service: Endoscopy;  Laterality: N/A;    Social History   Social History  . Marital Status: Single    Spouse Name: N/A  . Number of Children: 0  . Years of Education: N/A   Occupational History  . 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 [employer]   Social History Main Topics  . Smoking status: Current Every Day Smoker -- 0.40 packs/day for 38  years    Types: Cigarettes  . Smokeless tobacco: Never Used     Comment: ~ 1 ppd   . Alcohol Use: 4.2 oz/week    7 Glasses of wine per week  . Drug Use: No  . Sexual Activity: Not Currently     Comment: 1st intercourse 60 yo-More than 5 partners   Other Topics Concern  . Not on file   Social History Narrative   sister lives w/ pt    Lost mom 12-2013   Family History  Problem Relation Age of Onset  . Colon cancer Neg Hx   . Breast cancer Neg Hx   . Esophageal cancer Neg Hx   . Rectal cancer Neg Hx   . Stomach cancer Neg Hx   . Diabetes Mother     GM  . Heart disease Mother     in her 68s  . Heart disease Maternal Grandfather   . Cancer Father     sarcoma  . Diabetes Maternal Uncle   . Diabetes Maternal Grandmother          Medication List       This list is accurate as of: 04/25/16 12:51 PM.  Always use your most recent med list.               acetaminophen 500 MG tablet  Commonly known as:  TYLENOL  Take 500 mg by mouth every 4 (four) hours as needed for moderate pain or fever. For fever     ALPRAZolam 0.5 MG tablet  Commonly known as:  XANAX  Take 0.5-1 tablets (0.25-0.5 mg total) by mouth at bedtime as needed for sleep.     aspirin 81 MG chewable tablet  Chew 81 mg by mouth daily.     beclomethasone 80 MCG/ACT inhaler  Commonly known as:  QVAR  Inhale 2 puffs into the lungs 2 (two) times daily.     Biotin 1000 MCG tablet  Take 1,000 mcg by mouth 3 (three) times daily.     HYDROcodone-acetaminophen 10-325 MG tablet  Commonly known as:  NORCO  Take 1 tablet by mouth every 6 (six) hours as needed.     HYDROcodone-acetaminophen 10-325 MG tablet  Commonly known as:  NORCO  Take 1 tablet by mouth every 6 (six) hours as needed.     LYSINE PO  Take 1 tablet by mouth daily.     Magnesium 400 MG Tabs  Take 1 tablet by mouth daily.     methocarbamol 500 MG tablet  Commonly known as:  ROBAXIN  Take 1 tablet (500 mg total) by mouth every 8 (eight)  hours as needed for muscle spasms.     NICORETTE 4 MG lozenge  Generic drug:  nicotine polacrilex  Place 4 mg inside cheek every 3 (three) hours as needed for smoking cessation. For nicotine cravings.     pantoprazole 40 MG tablet  Commonly known as:  PROTONIX  Take 1 tablet (40 mg total) by mouth 2 (two) times daily before a meal.     Potassium Gluconate 595 MG Caps  Take 1 capsule by mouth daily.     PROVENTIL HFA 108 (90 Base) MCG/ACT inhaler  Generic drug:  albuterol  Inhale 2 puffs into the lungs every 4 (four) hours as needed for wheezing or shortness of breath.     tiotropium 18 MCG inhalation capsule  Commonly known as:  [place] into inhaler and inhale.     VITAMIN D PO  Take 1 tablet by mouth daily.           Objective:   Physical Exam BP 126/74 mmHg  Pulse 76  Temp(Src) 98.6 F (37 C) (Oral)  Ht 5\' 7"  (1.702 m)  Wt 116 lb 2 oz (52.674 kg)  BMI 18.18 kg/m2  SpO2 96%  LMP 11/05/2004  General:   Well developed, slightly under weight appearing but looks stronger than on previous visits. NAD.  Neck: No  thyromegaly  HEENT:  Normocephalic . Face symmetric, atraumatic Lungs:  CTA B Normal respiratory effort, no intercostal retractions, no accessory muscle use. Heart: RRR,  no murmur.  No pretibial edema bilaterally  Abdomen:  Not distended, soft, non-tender. No rebound or rigidity.   Skin: Exposed areas without rash. Not pale. Not jaundice Neurologic:  alert & oriented X3.  Speech normal . Gait and baseline Psych: Cognition and judgment appear intact.  Cooperative with normal attention span and concentration.  Behavior appropriate. No anxious or depressed appearing.    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 UDS contract today. Limit acetaminophen intake, see AVS B12 deficiency: Not on supplements, check a B12. Dysuria: pt saw blood in the toilet paper, unsure where it came from, some dysuria now. Check a UA, urine culture. If she has any vaginal bleeding recommend to contact gynecology. RTC 8 months

## 2016-04-27 ENCOUNTER — Other Ambulatory Visit: Payer: Self-pay | Admitting: Internal Medicine

## 2016-04-27 MED ORDER — NITROFURANTOIN MONOHYD MACRO 100 MG PO CAPS: 100.0000 mg | ORAL_CAPSULE | Freq: Two times a day (BID) | ORAL | Status: DC

## 2016-04-28 LAB — URINE CULTURE: Colony Count: 75000

## 2016-04-28 MED FILL — NITROFURANTOIN MONO-MCR 100: 100 | 5 days supply | Qty: 10 | Fill #0

## 2016-05-04 ENCOUNTER — Telehealth: Payer: Self-pay

## 2016-05-04 NOTE — Telephone Encounter (Signed)
UDS: 04/22/2016  Negative for Alprazolam:PRN Positive for Hydrocodone   Low risk per Dr. Larose Kells 05/04/2016

## 2016-05-25 MED FILL — HYDROCODON-APAP 10-325: 10-325 | 22 days supply | Qty: 90 | Fill #0

## 2016-06-02 ENCOUNTER — Encounter: Payer: Self-pay | Admitting: Internal Medicine

## 2016-06-08 MED FILL — PANTOPRAZOLE SOD DR 40 MG T: 40 | 90 days supply | Qty: 180 | Fill #2

## 2016-06-08 MED FILL — VENTOLIN HFA 90 MCG INHALER: 108 (90 BAS | 25 days supply | Qty: 18 | Fill #1

## 2016-06-27 ENCOUNTER — Ambulatory Visit (INDEPENDENT_AMBULATORY_CARE_PROVIDER_SITE_OTHER): Payer: 59 | Admitting: Family Medicine

## 2016-06-27 ENCOUNTER — Encounter: Payer: Self-pay | Admitting: Family Medicine

## 2016-06-27 VITALS — BP 120/64 | HR 100 | Temp 98.7°F | Ht 67.0 in | Wt 111.6 lb

## 2016-06-27 DIAGNOSIS — S61214A Laceration without foreign body of right ring finger without damage to nail, initial encounter: Secondary | ICD-10-CM

## 2016-06-27 MED ORDER — CEPHALEXIN 500 MG PO CAPS: 500.0000 mg | ORAL_CAPSULE | Freq: Three times a day (TID) | ORAL | 0 refills | Status: DC

## 2016-06-27 MED ORDER — HYDROCODONE-ACETAMINOPHEN 10-325 MG PO TABS: 1.0000 | ORAL_TABLET | Freq: Four times a day (QID) | ORAL | 0 refills | Status: DC | PRN

## 2016-06-27 MED FILL — CEPHALEXIN 500 MG CAPSULE: 500 | 10 days supply | Qty: 30 | Fill #0

## 2016-06-27 NOTE — Progress Notes (Signed)
[facility] at [facility] [address] [place], [place] [address] [phone]  Date:  [id number]   Name:  [name] [name]   DOB:  [birth year]   MRN:  [id number]  PCP:  Kathlene November, MD    Chief Complaint: Laceration (c/o cutting right hand middle finger while cooking. )   History of Present Illness:  [name] [name] is a 60 y.o. very pleasant female patient who presents with the following:  Here today with a cut to her finger- Tdap 12/2013.  She cut her RIGHT long finger on a food processor yesterday, just about 24 hours ago.  This occurred in the afternoon while she was making a salad.   She applied a large amount of black pepper to the wound in order to stop the bleeding.  She also applied hemorrhoid cream with lido today to control the pain.  She has bandaged the wound. Otherwise she is well today and does not have any other concern She will be due for a RF of her chronic pain medication in a few days-  I will do this for her since she is here and her PCP is out of the office for a family emergency this week.    Patient Active Problem List   Diagnosis Date Noted  . PCP NOTES >>>>>>>>>>>>>>>>>>>>>>>> 11/24/2015  . Subclavian vein obstruction (HCC)   . Obstruction of vein 09/25/2015  . Arm swelling ---R--- 07/20/2015  . Upper back pain 03/31/2015  . Right shoulder injury 01/06/2015  . Prediabetes 04/02/2014  . 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 Avulsion fracture of trochanter of femur ([place]) 10/17/2013  . Anxiety 10/17/2013  . Intertrochanteric fracture of femur ([place]) 10/17/2013  . Other dysphagia 12/03/2012  . Odynophagia 11/28/2012  . 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 S/P lobectomy of lung 11/18/2012  . History of surgical procedure 11/18/2012  . Weight loss 12/13/2011  . Annual physical exam 06/23/2011  . INSOMNIA-SLEEP DISORDER-UNSPEC 11/22/2010  . PALPITATIONS 02/27/2009  . CIGARETTE SMOKER 12/24/2007  .  BRONCHIECTASIS 12/24/2007  . ISCHEMIC COLITIS 12/24/2007  . HEPATIC CYST 12/24/2007  . HEADACHES, HX OF 12/24/2007  . Vascular insufficiency of intestine ([place]) 12/24/2007  . B12 DEFICIENCY 12/30/2006  . ASTHMA 12/30/2006  . COPD GOLD 0  12/30/2006  . GERD 12/30/2006  . ENDOMETRIOSIS NOS 12/30/2006  . LICHEN PLANUS [id number]  . Osteoarthritis -- pain mngmt--UDS 12/30/2006  . Osteoporosis 12/30/2006    Past Medical History:  Diagnosis Date  . Arthritis   . B12 deficiency   . Bronchiectasis ([place])   . Cataracts, bilateral   . Colitis    induced by decongestants, NSAIds  . COPD (chronic obstructive pulmonary disease) ([place])    per CT 11/10/05, will minimal bronchiectasis rll  . Endometriosis   . GERD (gastroesophageal reflux disease)   . 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 Hemorrhoid   . Lichen planus   . 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 Osteoporosis    dexa per gyn  . Pneumothorax, spontaneous, tension    x 2 in her 20's.  has a chest tube for one  . Shoulder dislocation 2015    R shoulder , x 2   . 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 ESOPHAGOGASTRODUODENOSCOPY  12/03/2012   Procedure: ESOPHAGOGASTRODUODENOSCOPY (  EGD);  Surgeon: [name], MD;  Location: [facility] ENDOSCOPY;  Service: Endoscopy;  Laterality: N/A;  . FLEXIBLE BRONCHOSCOPY  11/05/2012   Procedure: FLEXIBLE BRONCHOSCOPY;  Surgeon: [name], MD;  Location: [place];  Service: Thoracic;  Laterality: N/A;  . KNEE SURGERY     .not replacement .x8  . LOBECTOMY  11/05/2012   Procedure: LOBECTOMY;  Surgeon: [name], MD;  Location: [facility] OR;  Service: Thoracic;  Laterality: Right;  right upper lobectomy, and part of the right lower lobe  . MEDIASTINOSCOPY  05/25/2012   Procedure: MEDIASTINOSCOPY;  Surgeon: [name], MD;  Location: [facility] OR;  Service: Thoracic;   Laterality: N/A;  . ovarian cyst removed    . THORACOTOMY  11/05/2012   Procedure: THORACOTOMY MAJOR;  Surgeon: [name], MD;  Location: [facility] OR;  Service: Thoracic;  Laterality: Right;  . TONSILLECTOMY    . VIDEO BRONCHOSCOPY  02/15/2012   Procedure: VIDEO BRONCHOSCOPY WITH FLUORO;  Surgeon: [name], MD;  Location: [facility] ENDOSCOPY;  Service: Endoscopy;  Laterality: Bilateral;  WASHING- INTERVENTION BIOPSIES INTERVENTION BRONCHOSCOPY WITH VIDEO    Social History  Substance Use Topics  . Smoking status: Current Every Day Smoker    Packs/day: 0.40    Years: 38.00    Types: Cigarettes  . Smokeless tobacco: Never Used     Comment: ~ 1 ppd   . Alcohol use 4.2 oz/week    7 Glasses of wine per week    Family History  Problem Relation Age of Onset  . Colon cancer Neg Hx   . Breast cancer Neg Hx   . Esophageal cancer Neg Hx   . Rectal cancer Neg Hx   . Stomach cancer Neg Hx   . Diabetes Mother     GM  . Heart disease Mother     in her 28s  . Heart disease Maternal Grandfather   . Cancer Father     sarcoma  . Diabetes Maternal Uncle   . Diabetes Maternal Grandmother     Allergies  Allergen Reactions  . Alendronate Sodium Other (See Comments)    chest pain  . Antihistamines, Chlorpheniramine-Type Other (See Comments)    "makes lungs bleed"  . Benadryl [Diphenhydramine] Other (See Comments)    "makes lungs bleed" (patient states she can tolerate the newer ones, like Claritin)  . Nsaids Other (See Comments)    ischemic colitis  . Other Other (See Comments)    (All anti-inflammatories) ischemic colitis  . Pheniramine Other (See Comments)    "makes lungs bleed"  . Pseudoephedrine Other (See Comments)    Ischemic colitis  . Levofloxacin Anxiety  . Robitussin A-C [Guaifenesin-Codeine] Itching and Other (See Comments)    Palms itching    Medication list has been reviewed and updated.  Current Outpatient Prescriptions on File Prior to Visit  Medication Sig Dispense  Refill  . acetaminophen (TYLENOL) 500 MG tablet Take 500 mg by mouth every 4 (four) hours as needed for moderate pain or fever. For fever    . albuterol (PROVENTIL HFA) 108 (90 Base) MCG/ACT inhaler Inhale 2 puffs into the lungs every 4 (four) hours as needed for wheezing or shortness of breath.    . ALPRAZolam (XANAX) 0.5 MG tablet Take 0.5-1 tablets (0.25-0.5 mg total) by mouth at bedtime as needed for sleep. 90 tablet 1  . aspirin 81 MG chewable tablet Chew 81 mg by mouth daily.    . beclomethasone (QVAR) 80 MCG/ACT inhaler Inhale 2  puffs into the lungs 2 (two) times daily.    . Biotin 1000 MCG tablet Take 1,000 mcg by mouth 3 (three) times daily.    . Cholecalciferol (VITAMIN D PO) Take 1 tablet by mouth daily.    Marland Kitchen HYDROcodone-acetaminophen (NORCO) 10-325 MG tablet Take 1 tablet by mouth every 6 (six) hours as needed. 90 tablet 0  . HYDROcodone-acetaminophen (NORCO) 10-325 MG tablet Take 1 tablet by mouth every 6 (six) hours as needed. 90 tablet 0  . LYSINE PO Take 1 tablet by mouth daily.    . Magnesium 400 MG TABS Take 1 tablet by mouth daily.    . methocarbamol (ROBAXIN) 500 MG tablet Take 1 tablet (500 mg total) by mouth every 8 (eight) hours as needed for muscle spasms. (Patient taking differently: Take 250 mg by mouth every 8 (eight) hours as needed for muscle spasms. ) 90 tablet 1  . nicotine polacrilex (NICORETTE) 4 MG lozenge Place 4 mg inside cheek every 3 (three) hours as needed for smoking cessation. For nicotine cravings.    . pantoprazole (PROTONIX) 40 MG tablet Take 1 tablet (40 mg total) by mouth 2 (two) times daily before a meal. 60 tablet 12  . Potassium Gluconate 595 MG CAPS Take 1 capsule by mouth daily.    . nitrofurantoin, macrocrystal-monohydrate, (MACROBID) 100 MG capsule Take 1 capsule (100 mg total) by mouth 2 (two) times daily. (Patient not taking: Reported on 06/27/2016) 10 capsule 0  . tiotropium (SPIRIVA) 18 MCG inhalation capsule Place into inhaler and inhale.      No current facility-administered medications on file prior to visit.     Review of Systems:  As per HPI- otherwise negative.   Physical Examination: There were no vitals filed for this visit. Vitals:   06/27/16 1355  Weight: 111 lb 9.6 oz (50.6 kg)  Height: 5\' 7"  (1.702 m)   Body mass index is 17.48 kg/m. Ideal Body Weight: Weight in (lb) to have BMI = 25: 159.3   GEN: WDWN, NAD, Non-toxic, Alert & Oriented x 3, thin build, looks well HEENT: Atraumatic, Normocephalic.  Ears and Nose: No external deformity. EXTR: No clubbing/cyanosis/edema NEURO: Normal gait.  PSYCH: Normally interactive. Conversant. Not depressed or anxious appearing.  Calm demeanor.  Right hand: she has nearly avulsed the tip of the long finger, palmar aspect.  It is still attached near the nail.  The wound has black pepper in it.  She has normal flexion and extension of the finger, but feels that the very tip is somewhat numb  VC obtained.  Anesthesia with 1.5 ml of 1% lido in the web space on either side of the 3rd distal MC.  Washed with soap and water and irrigated with copious H2O in the sink, removed all pepper from wound.  Explored wound- do not see any evidence of penetration to bone, retained FB or damage to internal structures.  The very tip of the nail is avulsed.   Closed wound with #4 SI sutures using 4-0 ethilon.  Applied bandage.  Pt tolerated well   Assessment and Plan: Laceration of third finger of right hand, initial encounter - Plan: cephALEXin (KEFLEX) 500 MG capsule  Repaired as above.  Wound was open for 24 hours and she had applied pepper to the wound- will place her on keflex for 10 days. Plan for SR in 1 week See patient instructions for more details.     Signed Lamar Blinks, MD

## 2016-06-27 NOTE — Patient Instructions (Signed)
We sutured your right long finger today Leave the current bandage in place for about 24 hours, then you can bathe as normal and replace with a clean dry bandage.  Keep a more substantial bandage on for 2-3 days; then you can just keep it covered with a band-aid.  Please come and see me in 7- 10 days for suture removal.   If you have any sign of infection- redness, pus, heat, swelling please let me know!  Use the keflex antibiotic as directed to help prevent infection.

## 2016-06-27 NOTE — Progress Notes (Signed)
Pre visit review using our clinic review tool, if applicable. No additional management support is needed unless otherwise documented below in the visit note. 

## 2016-06-29 ENCOUNTER — Encounter: Payer: Self-pay | Admitting: Family Medicine

## 2016-06-29 NOTE — Telephone Encounter (Signed)
Pt called to f/u. She is concerned at the appearance of her finger. She said that she wasn't looking when stitches were placed on Monday. She said if it's fine to just send her a Comptroller. If it's not, please call her work #. Ph# [phone] (work #, call til 5pm)

## 2016-06-30 MED FILL — HYDROCODON-APAP 10-325: 10-325 | 22 days supply | Qty: 90 | Fill #0

## 2016-07-04 ENCOUNTER — Ambulatory Visit (INDEPENDENT_AMBULATORY_CARE_PROVIDER_SITE_OTHER): Payer: Self-pay | Admitting: Family Medicine

## 2016-07-04 ENCOUNTER — Encounter: Payer: Self-pay | Admitting: Family Medicine

## 2016-07-04 VITALS — BP 100/73 | HR 75 | Temp 98.1°F | Ht 67.0 in | Wt 115.6 lb

## 2016-07-04 DIAGNOSIS — Z4802 Encounter for removal of sutures: Secondary | ICD-10-CM

## 2016-07-04 NOTE — Progress Notes (Signed)
Pre visit review using our clinic review tool, if applicable. No additional management support is needed unless otherwise documented below in the visit note. 

## 2016-07-04 NOTE — Progress Notes (Signed)
[place] at [facility] [address] [place], [place] [address] [phone]  Date:  [id number]   Name:  [name] [name]   DOB:  [birth year]   MRN:  [id number]  PCP:  Kathlene November, MD    Chief Complaint: Follow-up (Pt here to follow up on laceration of third finger of right hand. Pt states that she did have an episode yesterday of redness, swelling and bleeding yesterday. )   History of Present Illness:  [name] [name] is a 60 y.o. very pleasant female patient who presents with the following:  Here today for a ST.  She was seen about one week ago and had sutures placed to the tip of the right long finger after she cut it with a knife.   She is on keflex due to significant wound on the hand with 24 hour delayed closure.  Overall she is doing very well- no fever or chills. She felt like the finger was read yesterday but it is now doing ok She has not missed any work- she is in [place] with [employer]  Patient Active Problem List   Diagnosis Date Noted  . PCP NOTES >>>>>>>>>>>>>>>>>>>>>>>> 11/24/2015  . Subclavian vein obstruction (HCC)   . Obstruction of vein 09/25/2015  . Arm swelling ---R--- 07/20/2015  . Upper back pain 03/31/2015  . Right shoulder injury 01/06/2015  . Prediabetes 04/02/2014  . 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 Avulsion fracture of trochanter of femur ([place]) 10/17/2013  . Anxiety 10/17/2013  . Intertrochanteric fracture of femur ([place]) 10/17/2013  . Other dysphagia 12/03/2012  . Odynophagia 11/28/2012  . 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 S/P lobectomy of lung 11/18/2012  . History of surgical procedure 11/18/2012  . Weight loss 12/13/2011  . Annual physical exam 06/23/2011  . INSOMNIA-SLEEP DISORDER-UNSPEC 11/22/2010  . PALPITATIONS 02/27/2009  . CIGARETTE SMOKER 12/24/2007  . BRONCHIECTASIS 12/24/2007  . ISCHEMIC COLITIS 12/24/2007  . HEPATIC CYST 12/24/2007  . HEADACHES, HX OF 12/24/2007  .  Vascular insufficiency of intestine ([place]) 12/24/2007  . B12 DEFICIENCY 12/30/2006  . ASTHMA 12/30/2006  . COPD GOLD 0  12/30/2006  . GERD 12/30/2006  . ENDOMETRIOSIS NOS 12/30/2006  . LICHEN PLANUS [id number]  . Osteoarthritis -- pain mngmt--UDS 12/30/2006  . Osteoporosis 12/30/2006    Past Medical History:  Diagnosis Date  . Arthritis   . B12 deficiency   . Bronchiectasis ([place])   . Cataracts, bilateral   . Colitis    induced by decongestants, NSAIds  . COPD (chronic obstructive pulmonary disease) ([place])    per CT 11/10/05, will minimal bronchiectasis rll  . Endometriosis   . GERD (gastroesophageal reflux disease)   . 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 Hemorrhoid   . Lichen planus   . 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 Osteoporosis    dexa per gyn  . Pneumothorax, spontaneous, tension    x 2 in her 20's.  has a chest tube for one  . Shoulder dislocation 2015    R shoulder , x 2   . 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 ESOPHAGOGASTRODUODENOSCOPY  12/03/2012   Procedure: ESOPHAGOGASTRODUODENOSCOPY (EGD);  Surgeon: [name], MD;  Location[facility] ENDOSCOPY;  Service: Endoscopy;  Laterality: N/A;  . FLEXIBLE BRONCHOSCOPY  11/05/2012  Procedure: FLEXIBLE BRONCHOSCOPY;  Surgeon: [name], MD;  Location: [facility];  Service: Thoracic;  Laterality: N/A;  . KNEE SURGERY     .not replacement .x8  . LOBECTOMY  11/05/2012   Procedure: LOBECTOMY;  Surgeon: [name], MD;  Location: [facility] OR;  Service: Thoracic;  Laterality: Right;  right upper lobectomy, and part of the right lower lobe  . MEDIASTINOSCOPY  05/25/2012   Procedure: MEDIASTINOSCOPY;  Surgeon: [name], MD;  Location: [facility] OR;  Service: Thoracic;  Laterality: N/A;  . ovarian cyst removed    . THORACOTOMY  11/05/2012   Procedure: THORACOTOMY MAJOR;  Surgeon: [name], MD;  Location: [facility] OR;  Service: Thoracic;  Laterality: Right;  . TONSILLECTOMY    . VIDEO BRONCHOSCOPY  02/15/2012   Procedure: VIDEO BRONCHOSCOPY WITH FLUORO;  Surgeon: [name], MD;  Location: [facility] ENDOSCOPY;  Service: Endoscopy;  Laterality: Bilateral;  WASHING- INTERVENTION BIOPSIES INTERVENTION BRONCHOSCOPY WITH VIDEO    Social History  Substance Use Topics  . Smoking status: Current Every Day Smoker    Packs/day: 0.40    Years: 38.00    Types: Cigarettes  . Smokeless tobacco: Never Used     Comment: ~ 1 ppd   . Alcohol use 4.2 oz/week    7 Glasses of wine per week    Family History  Problem Relation Age of Onset  . Diabetes Mother     GM  . Heart disease Mother     in her 73s  . Heart disease Maternal Grandfather   . Cancer Father     sarcoma  . Diabetes Maternal Uncle   . Diabetes Maternal Grandmother   . Colon cancer Neg Hx   . Breast cancer Neg Hx   . Esophageal cancer Neg Hx   . Rectal cancer Neg Hx   . Stomach cancer Neg Hx     Allergies  Allergen Reactions  . Alendronate Sodium Other (See Comments)    chest pain  . Antihistamines, Chlorpheniramine-Type Other (See Comments)    "makes lungs bleed"  . Benadryl [Diphenhydramine] Other (See Comments)    "makes lungs bleed" (patient states she can tolerate the newer ones, like Claritin)  . Nsaids Other (See Comments)    ischemic colitis  . Other Other (See Comments)    (All anti-inflammatories) ischemic colitis  . Pheniramine Other (See Comments)    "makes lungs bleed"  . Pseudoephedrine Other (See Comments)    Ischemic colitis  . Levofloxacin Anxiety  . Robitussin A-C [Guaifenesin-Codeine] Itching and Other (See Comments)    Palms itching    Medication list has been reviewed and updated.  Current Outpatient Prescriptions on File Prior to Visit  Medication Sig Dispense Refill  . acetaminophen (TYLENOL) 500 MG tablet Take 500 mg by mouth every 4 (four) hours as needed for moderate pain or  fever. For fever    . albuterol (PROVENTIL HFA) 108 (90 Base) MCG/ACT inhaler Inhale 2 puffs into the lungs every 4 (four) hours as needed for wheezing or shortness of breath.    . ALPRAZolam (XANAX) 0.5 MG tablet Take 0.5-1 tablets (0.25-0.5 mg total) by mouth at bedtime as needed for sleep. 90 tablet 1  . aspirin 81 MG chewable tablet Chew 81 mg by mouth daily.    . beclomethasone (QVAR) 80 MCG/ACT inhaler Inhale 2 puffs into the lungs 2 (two) times daily.    . cephALEXin (KEFLEX) 500 MG capsule Take 1 capsule (500 mg total) by mouth  3 (three) times daily. 30 capsule 0  . Cholecalciferol (VITAMIN D PO) Take 1 tablet by mouth daily.    Marland Kitchen HYDROcodone-acetaminophen (NORCO) 10-325 MG tablet Take 1 tablet by mouth every 6 (six) hours as needed. 90 tablet 0  . LYSINE PO Take 1 tablet by mouth daily.    . Magnesium 400 MG TABS Take 1 tablet by mouth daily.    . methocarbamol (ROBAXIN) 500 MG tablet Take 1 tablet (500 mg total) by mouth every 8 (eight) hours as needed for muscle spasms. (Patient taking differently: Take 250 mg by mouth every 8 (eight) hours as needed for muscle spasms. ) 90 tablet 1  . nicotine polacrilex (NICORETTE) 4 MG lozenge Place 4 mg inside cheek every 3 (three) hours as needed for smoking cessation. For nicotine cravings.    . pantoprazole (PROTONIX) 40 MG tablet Take 1 tablet (40 mg total) by mouth 2 (two) times daily before a meal. 60 tablet 12  . Potassium Gluconate 595 MG CAPS Take 1 capsule by mouth daily.    Marland Kitchen tiotropium (SPIRIVA) 18 MCG inhalation capsule Place into inhaler and inhale.     No current facility-administered medications on file prior to visit.     Review of Systems:  As per HPI- otherwise negative.   Physical Examination: Vitals:   07/04/16 1446  BP: 100/73  Pulse: 75  Temp: 98.1 F (36.7 C)   Vitals:   07/04/16 1446  Weight: 115 lb 9.6 oz (52.4 kg)  Height: 5\' 7"  (1.702 m)   Body mass index is 18.11 kg/m. Ideal Body Weight: Weight in  (lb) to have BMI = 25: 159.3   GEN: WDWN, NAD, Non-toxic, Alert & Oriented x 3, looks well HEENT: Atraumatic, Normocephalic.  Ears and Nose: No external deformity. EXTR: No clubbing/cyanosis/edema NEURO: Normal gait.  PSYCH: Normally interactive. Conversant. Not depressed or anxious appearing.  Calm demeanor.  Right hand- healing wound at tip of long finger.  Removed #4 SI sutures and dressed with steristrips and bandage.  Normal ROM of the DIP joint.  Sensation at the end of the fingers is getting better No evidence of infection such as redness, swelling, heat, or pus  Assessment and Plan: Visit for suture removal  Well healing wound. She nearly avulsed the tip of her finger so counseled her again that this tissue may or may not still be viable.  However it does appear to be reattaching to the end of the finger and does appear viable at this time.  She will continue to protect it as it heals and will let me know if any concerns about her healing process   Signed Lamar Blinks, MD

## 2016-07-08 ENCOUNTER — Telehealth: Payer: Self-pay | Admitting: Internal Medicine

## 2016-07-08 ENCOUNTER — Encounter: Payer: Self-pay | Admitting: Family Medicine

## 2016-07-08 NOTE — Telephone Encounter (Signed)
Please advise. Thanks.  

## 2016-07-08 NOTE — Telephone Encounter (Signed)
Relation to [id number] Call back number: [phone] work  Pharmacy:  Reason for call:  Patient would like to remove bandages from cut finger due to the smell. Patient was seen by Dr Lorelei Pont 07/04/16/ please advise

## 2016-07-14 MED FILL — QVAR 80 MCG ORAL INHALER: 80 | 30 days supply | Qty: 9 | Fill #3

## 2016-08-03 ENCOUNTER — Telehealth: Payer: Self-pay | Admitting: Internal Medicine

## 2016-08-03 MED ORDER — HYDROCODONE-ACETAMINOPHEN 10-325 MG PO TABS: 1.0000 | ORAL_TABLET | Freq: Four times a day (QID) | ORAL | 0 refills | Status: DC | PRN

## 2016-08-03 NOTE — Telephone Encounter (Signed)
Okay #90, 2 prescriptions 

## 2016-08-03 NOTE — Telephone Encounter (Signed)
Pt is requesting refill on Hydrocodone.  Last OV: 04/25/2016 Last Fill: 06/27/2016 #90 and 0RF By Dr. Lorelei Pont UDS: 04/22/2016 Low risk  Please advise.

## 2016-08-03 NOTE — Telephone Encounter (Signed)
°  Relation to [id number] Call back [phone] Pharmacy:  Reason for call: pt is needing rx HYDROcodone-acetaminophen ([place]) 10-325 MG tablet, pt states she would like to pick up on Friday, please call when available and ready for pick up

## 2016-08-03 NOTE — Telephone Encounter (Signed)
Please inform Pt that Rx has been placed at front desk for pick up at her convenience. Thank you.  

## 2016-08-03 NOTE — Telephone Encounter (Signed)
Rx's for September and October 2017 printed, awaiting MD signature.

## 2016-08-05 MED FILL — HYDROCODON-APAP 10-325: 10-325 | 22 days supply | Qty: 90 | Fill #0

## 2016-08-11 ENCOUNTER — Other Ambulatory Visit: Payer: Self-pay | Admitting: Internal Medicine

## 2016-08-11 MED FILL — ALPRAZolam 0.5 MG TABS: 0.5 | 90 days supply | Qty: 90 | Fill #0

## 2016-08-11 NOTE — Telephone Encounter (Signed)
Refill x1 

## 2016-08-11 NOTE — Telephone Encounter (Signed)
Pt is requesting refill on Alprazolam. Dr. Ethel Rana Pt.   Last OV: 04/25/2016 Last Fill: 12/15/2015 #90 and 1RF UDS: 04/22/2016 Low risk

## 2016-08-11 NOTE — Telephone Encounter (Signed)
Rx printed, awaiting DO signature.  

## 2016-08-11 NOTE — Telephone Encounter (Signed)
Rx faxed to [facility].  

## 2016-08-17 ENCOUNTER — Encounter: Payer: Self-pay | Admitting: Women's Health

## 2016-08-17 ENCOUNTER — Ambulatory Visit (INDEPENDENT_AMBULATORY_CARE_PROVIDER_SITE_OTHER): Payer: 59 | Admitting: Women's Health

## 2016-08-17 VITALS — BP 118/80 | Ht 67.0 in | Wt 115.0 lb

## 2016-08-17 DIAGNOSIS — R300 Dysuria: Secondary | ICD-10-CM

## 2016-08-17 DIAGNOSIS — N3000 Acute cystitis without hematuria: Secondary | ICD-10-CM | POA: Diagnosis not present

## 2016-08-17 DIAGNOSIS — N898 Other specified noninflammatory disorders of vagina: Secondary | ICD-10-CM

## 2016-08-17 LAB — WET PREP FOR TRICH, YEAST, CLUE
Clue Cells Wet Prep HPF POC: NONE SEEN
TRICH WET PREP: NONE SEEN
WBC, Wet Prep HPF POC: NONE SEEN
YEAST WET PREP: NONE SEEN

## 2016-08-17 LAB — URINALYSIS W MICROSCOPIC + REFLEX CULTURE
Bilirubin Urine: NEGATIVE
Casts: NONE SEEN [LPF]
Crystals: NONE SEEN [HPF]
Glucose, UA: NEGATIVE
NITRITE: NEGATIVE
PH: 7 (ref 5.0–8.0)
Protein, ur: NEGATIVE
SPECIFIC GRAVITY, URINE: 1.01 (ref 1.001–1.035)
YEAST: NONE SEEN [HPF]

## 2016-08-17 MED ORDER — CIPROFLOXACIN HCL 250 MG PO TABS: 250.0000 mg | ORAL_TABLET | Freq: Two times a day (BID) | ORAL | 0 refills | Status: DC

## 2016-08-17 MED FILL — CIPROFLOXACIN HCL 250 MG TA: 250 | 3 days supply | Qty: 6 | Fill #0

## 2016-08-17 NOTE — Patient Instructions (Signed)

## 2016-08-17 NOTE — Progress Notes (Signed)
Presents with multiple complaints including malodorous white vaginal discharge, dysuria, urinary frequency, and pelvic cramping. States urine has bad odor also. States she was dx with a UTI by PCP in May  after episode of hematuria, symptoms improved after completing Macrobid. Increased malodorous vaginal discharge for past 1-2 months with pelvic cramping and pressure. Denies any recent hematuria, fever, vaginal itching, vaginal bleeding, or recent sexual encounters. Postmenopausal/no HRT/no bleeding.   Exam: Appears well. External genitalia within normal limits, mildly erythematous, non-odorous scant white discharge. Speculum exam unable to be performed d/t pain. Swab of vaginal walls with q tip collected for wet prep. Wet prep negative. UA- trace ketones, 1+ blood, 2+ leukocytes, 10-20 WBCs, 3-10 RBCs, moderate bacteria. Lungs clear throughout  Urinary Tract Infection Asymptomatic vaginal atrophy  Plan: Ciprofloxacin 250 mg BID for 3 days. Has an allergy to levofloxacin but has tolerated Cipro in the past. UTI prevention discussed. Instructed to contact office if no improvement of symptoms after Ciprofloxacin completed. Urine culture pending. Reviewed importance of decreasing/quitting smoking.

## 2016-08-20 LAB — URINE CULTURE: Colony Count: >=100000

## 2016-08-21 ENCOUNTER — Encounter: Payer: Self-pay | Admitting: Women's Health

## 2016-09-07 MED FILL — VENTOLIN HFA 90 MCG INHALER: 108 (90 BAS | 25 days supply | Qty: 18 | Fill #2

## 2016-09-07 MED FILL — QVAR 80 MCG ORAL INHALER: 80 | 30 days supply | Qty: 9 | Fill #4

## 2016-09-09 MED FILL — HYDROCODON-APAP 10-325: 10-325 | 22 days supply | Qty: 90 | Fill #0

## 2016-09-24 DIAGNOSIS — H524 Presbyopia: Secondary | ICD-10-CM | POA: Diagnosis not present

## 2016-10-07 MED FILL — QVAR 80 MCG ORAL INHALER: 80 | 30 days supply | Qty: 9 | Fill #5

## 2016-10-07 MED FILL — VENTOLIN HFA 90 MCG INHALER: 108 (90 BAS | 25 days supply | Qty: 18 | Fill #3

## 2016-10-16 ENCOUNTER — Other Ambulatory Visit: Payer: Self-pay | Admitting: Internal Medicine

## 2016-10-17 MED ORDER — HYDROCODONE-ACETAMINOPHEN 10-325 MG PO TABS: 1.0000 | ORAL_TABLET | Freq: Four times a day (QID) | ORAL | 0 refills | Status: DC | PRN

## 2016-10-17 NOTE — Telephone Encounter (Signed)
Rx printed, awaiting MD signature.  

## 2016-10-17 NOTE — Telephone Encounter (Signed)
Rx placed at front desk for pick up. Pt informed via MyChart.

## 2016-10-17 NOTE — Telephone Encounter (Signed)
Pt is requesting refill on Hydrocodone.  Last OV: 04/25/2016 Last Fill: 08/03/2016 #90 and 0RF UDS: 04/22/2016 Low risk  Please advise.

## 2016-10-17 NOTE — Telephone Encounter (Signed)
Okay #90, no refills 

## 2016-10-19 MED FILL — HYDROCODON-APAP 10-325: 10-325 | 22 days supply | Qty: 90 | Fill #0

## 2016-10-25 DIAGNOSIS — J449 Chronic obstructive pulmonary disease, unspecified: Secondary | ICD-10-CM | POA: Diagnosis not present

## 2016-10-25 DIAGNOSIS — J479 Bronchiectasis, uncomplicated: Secondary | ICD-10-CM | POA: Diagnosis not present

## 2016-10-25 MED FILL — AMOX TR-K CLV 875-125 MG TA: 875-125 | 14 days supply | Qty: 28 | Fill #0

## 2016-10-26 ENCOUNTER — Ambulatory Visit
Admission: RE | Admit: 2016-10-26 | Discharge: 2016-10-26 | Disposition: A | Discharge status: Home / Self Care | Payer: 59 | Source: Ambulatory Visit | Attending: *Deleted | Admitting: *Deleted

## 2016-10-26 ENCOUNTER — Other Ambulatory Visit: Payer: Self-pay | Admitting: *Deleted

## 2016-10-26 DIAGNOSIS — J479 Bronchiectasis, uncomplicated: Secondary | ICD-10-CM

## 2016-10-26 DIAGNOSIS — R079 Chest pain, unspecified: Secondary | ICD-10-CM | POA: Diagnosis not present

## 2016-11-07 MED FILL — PANTOPRAZOLE SOD DR 40 MG T: 40 | 90 days supply | Qty: 180 | Fill #3

## 2016-11-18 ENCOUNTER — Ambulatory Visit: Payer: 59 | Admitting: Podiatry

## 2016-11-21 ENCOUNTER — Telehealth: Payer: Self-pay | Admitting: Internal Medicine

## 2016-11-21 MED ORDER — HYDROCODONE-ACETAMINOPHEN 10-325 MG PO TABS: 1.0000 | ORAL_TABLET | Freq: Four times a day (QID) | ORAL | 0 refills | Status: DC | PRN

## 2016-11-21 NOTE — Telephone Encounter (Signed)
Pt informed via MyChart that Rx has been placed at front desk for pick up.  

## 2016-11-21 NOTE — Telephone Encounter (Signed)
Pt is requesting refill on Hydrocodone.  Last OV: 04/25/2016, appt scheduled 12/01/2016 Last Fill: 10/17/2016 #90 and 0RF Pt sig: 1 tab q6h prn UDS: 04/22/2016 Low risk  Please advise.

## 2016-11-21 NOTE — Telephone Encounter (Signed)
Ok 90 , no RFs 

## 2016-11-21 NOTE — Telephone Encounter (Signed)
Rx printed, awaiting MD signature.  

## 2016-11-22 ENCOUNTER — Ambulatory Visit (INDEPENDENT_AMBULATORY_CARE_PROVIDER_SITE_OTHER): Payer: 59 | Admitting: Podiatry

## 2016-11-22 VITALS — BP 116/87 | HR 78 | Temp 98.0°F | Resp 14

## 2016-11-22 DIAGNOSIS — M79676 Pain in unspecified toe(s): Secondary | ICD-10-CM

## 2016-11-22 DIAGNOSIS — L03039 Cellulitis of unspecified toe: Secondary | ICD-10-CM

## 2016-11-22 DIAGNOSIS — L600 Ingrowing nail: Secondary | ICD-10-CM | POA: Diagnosis not present

## 2016-11-22 MED FILL — HYDROCODON-APAP 10-325: 10-325 | 23 days supply | Qty: 90 | Fill #0

## 2016-11-22 NOTE — Progress Notes (Signed)
   Subjective:    Patient ID: [name] [name], female    DOB: Dec 03, 1956, 60 y.o.   MRN: [id number]  HPI    Review of Systems  Musculoskeletal: Positive for arthralgias and myalgias.  All other systems reviewed and are negative.      Objective:   Physical Exam        Assessment & Plan:

## 2016-11-30 MED FILL — VENTOLIN HFA 90 MCG INHALER: 108 (90 BAS | 25 days supply | Qty: 18 | Fill #4

## 2016-11-30 MED FILL — QVAR 80 MCG ORAL INHALER: 80 | 30 days supply | Qty: 9 | Fill #6

## 2016-12-01 ENCOUNTER — Encounter: Payer: Self-pay | Admitting: Internal Medicine

## 2016-12-01 ENCOUNTER — Ambulatory Visit (INDEPENDENT_AMBULATORY_CARE_PROVIDER_SITE_OTHER): Payer: 59 | Admitting: Internal Medicine

## 2016-12-01 ENCOUNTER — Ambulatory Visit
Admission: RE | Admit: 2016-12-01 | Discharge: 2016-12-01 | Disposition: A | Discharge status: Home / Self Care | Payer: 59 | Source: Ambulatory Visit | Attending: Internal Medicine | Admitting: Internal Medicine

## 2016-12-01 VITALS — BP 124/68 | HR 84 | Temp 98.5°F | Resp 14 | Ht 67.0 in | Wt 110.5 lb

## 2016-12-01 DIAGNOSIS — G4700 Insomnia, unspecified: Secondary | ICD-10-CM | POA: Diagnosis not present

## 2016-12-01 DIAGNOSIS — M25552 Pain in left hip: Secondary | ICD-10-CM

## 2016-12-01 DIAGNOSIS — M150 Primary generalized (osteo)arthritis: Secondary | ICD-10-CM | POA: Diagnosis not present

## 2016-12-01 DIAGNOSIS — J471 Bronchiectasis with (acute) exacerbation: Secondary | ICD-10-CM

## 2016-12-01 DIAGNOSIS — M159 Polyosteoarthritis, unspecified: Secondary | ICD-10-CM

## 2016-12-01 MED ORDER — ALPRAZOLAM 0.5 MG PO TABS: 0.2500 mg | ORAL_TABLET | Freq: Every evening | ORAL | 0 refills | Status: DC | PRN

## 2016-12-01 MED FILL — ALPRAZolam 0.5 MG TABS: 0.5 | 90 days supply | Qty: 90 | Fill #0

## 2016-12-01 NOTE — Progress Notes (Signed)
Patient ID: [name] [name], female   DOB: May 30, 1956, 60 y.o.   MRN: [id number] Subjective: Patient presents today for evaluation of pain in toe(s). Patient is concerned for possible ingrown nail. Patient states that the pain has been present for a few weeks now. Patient presents today for further treatment and evaluation.  Objective:  General: Well developed, nourished, in no acute distress, alert and oriented x3   Dermatology: Skin is warm, dry and supple bilateral. Medial and lateral borders of the second digit right foot appears to be erythematous with evidence of an ingrowing nail. Purulent drainage noted with intruding nail into the respective nail fold. Pain on palpation noted to the border of the nail fold. The remaining nails appear unremarkable at this time. There are no open sores, lesions.  Vascular: Dorsalis Pedis artery and Posterior Tibial artery pedal pulses palpable. No lower extremity edema noted.   Neruologic: Grossly intact via light touch bilateral.  Musculoskeletal: Muscular strength within normal limits in all groups bilateral. Normal range of motion noted to all pedal and ankle joints.   Assesement: #1 Paronychia with ingrowing nail medial and lateral borders of the second digit right foot #2 Pain in toe #3 Incurvated nail  Plan of Care:  1. Patient evaluated.  2. Discussed treatment alternatives and plan of care. Explained nail avulsion procedure and post procedure course to patient. 3. Patient opted for permanent partial nail avulsion of both the medial and lateral borders of the second digit right foot.  4. Prior to procedure, local anesthesia infiltration utilized using 3 ml of a 50:50 mixture of 2% plain lidocaine and 0.5% plain marcaine in a normal hallux block fashion and a betadine prep performed.  5. Partial permanent nail avulsion with chemical matrixectomy performed using [id number] applications of phenol followed by alcohol flush.  6. Light dressing  applied. 7. Return to clinic in 2 weeks.   Edrick Kins, DPM [facility]  Dr. Edrick Kins, [address]                                        [place], [place] [address]                Office [phone]  Fax [phone]

## 2016-12-01 NOTE — Progress Notes (Signed)
Pre visit review using our clinic review tool, if applicable. No additional management support is needed unless otherwise documented below in the visit note. 

## 2016-12-01 NOTE — Progress Notes (Signed)
Alprazolam Rx faxed to [facility].

## 2016-12-01 NOTE — Patient Instructions (Addendum)
  GO TO THE FRONT DESK Schedule your next appointment for a  physical exam by 04-2017   STOP BY THE FIRST FLOOR:  get the XR    If the hip pain is not gradually improving, please let me know.  I do recommend you to take a B12 supplement OTC one or 2 tablets daily every day

## 2016-12-01 NOTE — Progress Notes (Signed)
Subjective:    Patient ID: [name] [name], female    DOB: Apr 30, 1956, 60 y.o.   MRN: [id number]  DOS:  12/01/2016 Type of visit - description : Routine checkup Interval history: Pulmonary: Saw Dr. Olena Heckle, had right upper chest pain, on Augmentin, they did a CT. Patient is not aware of results yet. Tobacco abuse: Uses nicotine gum in the mornings and still smokes tobacco late in the day Insomnia: Well controlled on Xanax needs a refill About 2 weeks ago was sitting in church and suddenly developed left distal back pain with some radiation to the buttock, increased by walking, none-at rest.  Review of Systems No nausea, vomiting, diarrhea. No anxiety- depression Denies injury or falls.   Past Medical History:  Diagnosis Date  . Arthritis   . B12 deficiency   . Bronchiectasis ([place])   . Cataracts, bilateral   . Colitis    induced by decongestants, NSAIds  . COPD (chronic obstructive pulmonary disease) ([place])    per CT 11/10/05, will minimal bronchiectasis rll  . Endometriosis   . GERD (gastroesophageal reflux disease)   . 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 Hemorrhoid   . Lichen planus   . 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 Osteoporosis    dexa per gyn  . Pneumothorax, spontaneous, tension    x 2 in her 20's.  has a chest tube for one  . Shoulder dislocation 2015    R shoulder , x 2   . 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 ESOPHAGOGASTRODUODENOSCOPY  12/03/2012   Procedure: ESOPHAGOGASTRODUODENOSCOPY (EGD);  Surgeon: [name], MD;  Location: [facility] ENDOSCOPY;  Service: Endoscopy;  Laterality: N/A;  . FLEXIBLE BRONCHOSCOPY  11/05/2012   Procedure: FLEXIBLE BRONCHOSCOPY;  Surgeon: [name], MD;  Location: [place];  Service: Thoracic;  Laterality: N/A;  . KNEE SURGERY     .not replacement .x8  . LOBECTOMY  11/05/2012   Procedure: LOBECTOMY;  Surgeon: [name], MD;  Location: [facility] OR;  Service: Thoracic;  Laterality: Right;  right upper lobectomy, and part of the right lower lobe  . MEDIASTINOSCOPY  05/25/2012   Procedure: MEDIASTINOSCOPY;  Surgeon: [name], MD;  Location: [facility] OR;  Service: Thoracic;  Laterality: N/A;  . ovarian cyst removed    . THORACOTOMY  11/05/2012   Procedure: THORACOTOMY MAJOR;  Surgeon: [name], MD;  Location: [facility] OR;  Service: Thoracic;  Laterality: Right;  . TONSILLECTOMY    . VIDEO BRONCHOSCOPY  02/15/2012   Procedure: VIDEO BRONCHOSCOPY WITH FLUORO;  Surgeon: [name], MD;  Location: [facility] ENDOSCOPY;  Service: Endoscopy;  Laterality: Bilateral;  WASHING- INTERVENTION BIOPSIES INTERVENTION BRONCHOSCOPY WITH VIDEO    Social History   Social History  . Marital status: Single    Spouse name: N/A  . Number of children: 0  . Years of education: N/A   Occupational History  . 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 [employer]   Social History Main Topics  . Smoking status: Current Every Day Smoker    Packs/day: 0.40    Years: 38.00    Types: Cigarettes  . Smokeless tobacco: Never Used     Comment: ~ 1 ppd   . Alcohol use 4.2 oz/week    7 Glasses of wine  per week  . Drug use: No  . Sexual activity: Not Currently     Comment: 1st intercourse 60 yo-More than 5 partners   Other Topics Concern  . Not on file   Social History Narrative   sister lives w/ pt    Lost mom 12-2013      Allergies as of 12/01/2016      Reactions   Alendronate Sodium Other (See Comments)   chest pain   Antihistamines, Chlorpheniramine-type Other (See Comments)   "makes lungs bleed"   Benadryl [diphenhydramine] Other (See Comments)   "makes lungs bleed" (patient states she can tolerate the newer ones, like Claritin)   Nsaids Other (See Comments)   ischemic colitis   Other Other (See Comments)   (All anti-inflammatories) ischemic colitis   Pheniramine Other  (See Comments)   "makes lungs bleed"   Pseudoephedrine Other (See Comments)   Ischemic colitis   Levofloxacin Anxiety   Robitussin A-c [guaifenesin-codeine] Itching, Other (See Comments)   Palms itching      Medication List       Accurate as of 12/01/16 11:59 PM. Always use your most recent med list.          acetaminophen 500 MG tablet Commonly known as:  TYLENOL Take 500 mg by mouth every 4 (four) hours as needed for moderate pain or fever. For fever   ALPRAZolam 0.5 MG tablet Commonly known as:  XANAX Take 0.5-1 tablets (0.25-0.5 mg total) by mouth at bedtime as needed for sleep.   aspirin 81 MG chewable tablet Chew 81 mg by mouth daily.   beclomethasone 80 MCG/ACT inhaler Commonly known as:  QVAR Inhale 2 puffs into the lungs 2 (two) times daily.   HYDROcodone-acetaminophen 7.5-325 MG tablet Commonly known as:  NORCO Take 1 tablet by mouth every 6 (six) hours as needed for moderate pain.   LYSINE PO Take 1 tablet by mouth daily.   Magnesium 400 MG Tabs Take 1 tablet by mouth daily.   methocarbamol 500 MG tablet Commonly known as:  ROBAXIN Take 1 tablet (500 mg total) by mouth every 8 (eight) hours as needed for muscle spasms.   NICORETTE 4 MG lozenge Generic drug:  nicotine polacrilex Place 4 mg inside cheek every 3 (three) hours as needed for smoking cessation. For nicotine cravings.   pantoprazole 40 MG tablet Commonly known as:  PROTONIX Take 1 tablet (40 mg total) by mouth 2 (two) times daily before a meal.   Potassium Gluconate 595 MG Caps Take 1 capsule by mouth daily.   PROVENTIL HFA 108 (90 Base) MCG/ACT inhaler Generic drug:  albuterol Inhale 2 puffs into the lungs every 4 (four) hours as needed for wheezing or shortness of breath.   VITAMIN D PO Take 1 tablet by mouth daily.          Objective:   Physical Exam BP 124/68 (BP Location: Left Arm, Patient Position: Sitting, Cuff Size: Small)   Pulse 84   Temp 98.5 F (36.9 C) (Oral)    Resp 14   Ht 5\' 7"  (1.702 m)   Wt 110 lb 8 oz (50.1 kg)   LMP 11/05/2004   SpO2 96%   BMI 17.31 kg/m  General:   Well developed, well nourished . NAD.  HEENT:  Normocephalic . Face symmetric, atraumatic Lungs:  CTA B Normal respiratory effort, no intercostal retractions, no accessory muscle use. Heart: RRR,  no murmur.  No pretibial edema bilaterally  MSK: Walks with a mildly limp, at  baseline. Hips: Right normal Left: Range of motion is slightly decreased, + pain triggered by passive rotation. Slightly TTP at the left buttock. Skin: Not pale. Not jaundice Neurologic:  alert & oriented X3.  Speech normal Psych--  Cognition and judgment appear intact.  Cooperative with normal attention span and concentration.  Behavior appropriate. No anxious or depressed appearing.       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 DJD, pain management: Currently on hydrocodone 10 mg, recommend to drop dose to 7.5 mg (start new dose w/ next RF) Left hip pain: Acute, for 2 weeks, no fall or injury. For now recommend to continue hydrocodone, will get a x-ray, if not better  refer to orthopedic surgery. Bronchiectasis: Closely follow-up by pulmonary, currently on Augmentin, had a CT, results pending. RTC 04/2017, CPX

## 2016-12-02 NOTE — Assessment & Plan Note (Signed)
Insomnia: UDS low risk 04/2016, refill Xanax B12 deficiency: Last B12 in the low normal range, not on supplements, encouraged to do. DJD, pain management: Currently on hydrocodone 10 mg, recommend to drop dose to 7.5 mg (start new dose w/ next RF) Left hip pain: Acute, for 2 weeks, no fall or injury. For now recommend to continue hydrocodone, will get a x-ray, if not better refer to orthopedic surgery. Bronchiectasis: Closely follow-up by pulmonary, currently on Augmentin, had a CT, results pending. RTC 04/2017, CPX

## 2016-12-26 ENCOUNTER — Ambulatory Visit: Payer: 59 | Admitting: Internal Medicine

## 2016-12-29 ENCOUNTER — Encounter: Payer: Self-pay | Admitting: Internal Medicine

## 2016-12-29 MED ORDER — HYDROCODONE-ACETAMINOPHEN 7.5-325 MG PO TABS: 1.0000 | ORAL_TABLET | Freq: Four times a day (QID) | ORAL | 0 refills | Status: DC | PRN

## 2016-12-29 NOTE — Telephone Encounter (Signed)
Rx's printed for February and March 2018, awaiting MD signature.

## 2016-12-29 NOTE — Telephone Encounter (Signed)
Pt informed MyChart that Rx has been placed at front desk for pick up at her convenience.

## 2016-12-29 NOTE — Telephone Encounter (Signed)
Pt is requesting refill on Hydrocodone.  Last OV: 12/01/2016 Last Fill: 11/21/2016 #90 and 0RF UDS: 04/22/2016 Low risk  Please advise.

## 2016-12-29 NOTE — Telephone Encounter (Signed)
Ok 90, 2 RXs 

## 2016-12-30 MED FILL — HYDROCODON-APAP 7.5-325: 7.5-325 | 23 days supply | Qty: 90 | Fill #0

## 2017-01-12 ENCOUNTER — Other Ambulatory Visit (HOSPITAL_COMMUNITY): Payer: Self-pay | Admitting: *Deleted

## 2017-01-12 DIAGNOSIS — J860 Pyothorax with fistula: Secondary | ICD-10-CM

## 2017-01-13 ENCOUNTER — Other Ambulatory Visit: Payer: Self-pay | Admitting: *Deleted

## 2017-01-13 DIAGNOSIS — J860 Pyothorax with fistula: Secondary | ICD-10-CM

## 2017-01-16 ENCOUNTER — Ambulatory Visit
Admission: RE | Admit: 2017-01-16 | Discharge: 2017-01-16 | Disposition: A | Discharge status: Home / Self Care | Payer: 59 | Source: Ambulatory Visit | Attending: *Deleted | Admitting: *Deleted

## 2017-01-16 DIAGNOSIS — J860 Pyothorax with fistula: Secondary | ICD-10-CM | POA: Diagnosis not present

## 2017-01-17 ENCOUNTER — Ambulatory Visit (HOSPITAL_COMMUNITY): Payer: 59

## 2017-02-03 MED FILL — HYDROCODON-APAP 7.5-325: 7.5-325 | 23 days supply | Qty: 90 | Fill #0

## 2017-03-05 ENCOUNTER — Other Ambulatory Visit: Payer: Self-pay | Admitting: Internal Medicine

## 2017-03-06 MED ORDER — HYDROCODONE-ACETAMINOPHEN 7.5-325 MG PO TABS: 1.0000 | ORAL_TABLET | Freq: Four times a day (QID) | ORAL | 0 refills | Status: DC | PRN

## 2017-03-06 NOTE — Telephone Encounter (Signed)
Rx's printed for April and May 2018, awaiting MD signature.

## 2017-03-06 NOTE — Telephone Encounter (Signed)
Pt is requesting refill on Hydrocodone.  Last OV: 12/01/2016 Last Fill: 12/29/2016 #90 and 0RF  (For February and March 2018) UDS: 04/22/2016 Low risk  Please advise.

## 2017-03-06 NOTE — Telephone Encounter (Signed)
Pt informed via MyChart that Rx's have been placed at front desk for pick up at Pt's convenience.

## 2017-03-06 NOTE — Telephone Encounter (Signed)
Okay #90, 2 prescriptions

## 2017-03-08 MED FILL — HYDROCODON-APAP 7.5-325: 7.5-325 | 22 days supply | Qty: 90 | Fill #0

## 2017-03-28 ENCOUNTER — Other Ambulatory Visit: Payer: Self-pay | Admitting: Internal Medicine

## 2017-03-28 ENCOUNTER — Encounter: Payer: Self-pay | Admitting: Internal Medicine

## 2017-03-29 MED ORDER — ALPRAZOLAM 0.5 MG PO TABS: 0.2500 mg | ORAL_TABLET | Freq: Every evening | ORAL | 0 refills | Status: DC | PRN

## 2017-03-29 MED ORDER — PANTOPRAZOLE SODIUM 40 MG PO TBEC: 40.0000 mg | DELAYED_RELEASE_TABLET | Freq: Two times a day (BID) | ORAL | 12 refills | Status: DC

## 2017-03-29 MED FILL — ALPRAZolam 0.5 MG TABS: 0.5 | 90 days supply | Qty: 90 | Fill #0

## 2017-03-29 MED FILL — PANTOPRAZOLE SOD DR 40 MG T: 40 | 30 days supply | Qty: 60 | Fill #0

## 2017-03-29 NOTE — Telephone Encounter (Signed)
Rx faxed to [facility].  

## 2017-03-29 NOTE — Telephone Encounter (Signed)
Rx printed, awaiting MD signature.  

## 2017-03-29 NOTE — Telephone Encounter (Signed)
Okay #90, no refills 

## 2017-03-29 NOTE — Telephone Encounter (Signed)
Pt is requesting refill on Alprazolam.  Last OV: 12/01/2016 Last Fill: 12/01/2016 #90 and 0RF UDS: 04/22/2016 Low risk  Please advise.

## 2017-04-05 ENCOUNTER — Encounter: Payer: Self-pay | Admitting: Surgery

## 2017-04-05 ENCOUNTER — Ambulatory Visit (INDEPENDENT_AMBULATORY_CARE_PROVIDER_SITE_OTHER): Payer: 59 | Admitting: Surgery

## 2017-04-05 VITALS — BP 146/97 | Resp 16 | Ht 67.0 in | Wt 112.0 lb

## 2017-04-05 DIAGNOSIS — Z902 Acquired absence of lung [part of]: Secondary | ICD-10-CM

## 2017-04-06 ENCOUNTER — Encounter: Payer: Self-pay | Admitting: Surgery

## 2017-04-06 NOTE — Progress Notes (Signed)
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 The abscess cavity extended across the fissure into the superior segment. She was started on antibiotic therapy with 4 drugs under the direction of Dr. Linus Salmons. Unfortunately she stopped taking them after 6 months because she thought the drugs were making her crazy. She also went back to smoking at that time because she thought that quitting smoking was also making her crazy. She has never felt great postop but did recover enough to go back to work and says that she has only missed one day of work since going back after surgery. She continued to have some chest wall pain but it improved. She has had recurrent episodes of hemoptysis over the past few years and at one point says that it was massive although she was not admitted and this is the first time that I heard about it. Over the past couple years she says that she has developed more right chest wall pain laterally and under her breast that is triggered by coughing or laughing, certain positions. She says it is severe and she can't move. It feels like a severe muscle spasm and when it resolves she has persistent aching in the chest wall for a while. She also describes some anterior chest discomfort and burning in her lungs. She still coughs up nasty sputum but no blood lately. She was referred to Dr. Joneen Caraway from pulmonary medicine at [facility] by Dr. Melvyn Novas. She underwent a high resolution CT of the chest on 10/26/2017 which showed a chronic mildly thick walled cavity in the apical right chest measuring 7.3 cm with stable irregular wall nodularity that had some debris inside and was suspicious for recurrent/persistent mycobacterial infection. There was concern about a possible bronchopleural fistula and she was referred to an interventional  pulmonologist, Dr. Montey Hora at [facility]. He felt that a fistula could not be ruled out but felt that her spasmodic right chest wall pain was likely post-thoracotomy and not due to the cavity in the chest. He recommended referral to a plastic surgeon at [facility] for some type of nerve block procedure. She says that she was told that she needed to see a thoracic surgeon first for an opinion and decided to return to see me.   She describes two different types of pain. The worst is the spasmodic "Charlie-horse" type pain in the lateral chest wall which seems to have a segmental distribution from the lateral chest wall to the right breast. It is triggered by certain movements and by laughing and coughing. She says it gets better if she raises her arm. When she gets this pain it is very severe. The second pain is more a general aching in the upper chest and back and is associated with a burning feeling in the lungs when she breaths. This does not appear to have any triggers.   Current Outpatient Prescriptions  Medication Sig Dispense Refill  . acetaminophen (TYLENOL) 500 MG tablet Take 500 mg by mouth every 4 (four) hours as needed for moderate pain or fever. For fever    . albuterol (PROVENTIL HFA) 108 (90 Base) MCG/ACT inhaler Inhale 2 puffs into the lungs every 4 (four) hours as needed for wheezing or shortness of breath.    . ALPRAZolam (XANAX) 0.5 MG tablet Take 0.5-1 tablets (0.25-0.5 mg total)  by mouth at bedtime as needed for sleep. 90 tablet 0  . aspirin 81 MG chewable tablet Chew 81 mg by mouth daily.    . beclomethasone (QVAR) 80 MCG/ACT inhaler Inhale 2 puffs into the lungs 2 (two) times daily.    . Cholecalciferol (VITAMIN D PO) Take 1 tablet by mouth daily.    Marland Kitchen HYDROcodone-acetaminophen (NORCO) 7.5-325 MG tablet Take 1 tablet by mouth every 6 (six) hours as needed for moderate pain. 90 tablet 0  . LYSINE PO Take 1 tablet by mouth daily.    . Magnesium 400 MG TABS Take 1 tablet by mouth daily.    .  methocarbamol (ROBAXIN) 500 MG tablet Take 1 tablet (500 mg total) by mouth every 8 (eight) hours as needed for muscle spasms. (Patient taking differently: Take 250 mg by mouth every 8 (eight) hours as needed for muscle spasms. ) 90 tablet 1  . nicotine polacrilex (NICORETTE) 4 MG lozenge Place 4 mg inside cheek every 3 (three) hours as needed for smoking cessation. For nicotine cravings.    . pantoprazole (PROTONIX) 40 MG tablet Take 1 tablet (40 mg total) by mouth 2 (two) times daily before a meal. 60 tablet 12  . Potassium Gluconate 595 MG CAPS Take 1 capsule by mouth daily.     No current facility-administered medications for this visit.      Physical Exam: BP (!) 146/97 (BP Location: Right Arm, Patient Position: Sitting, Cuff Size: Normal)   Resp 16   Ht 5\' 7"  (1.702 m)   Wt 112 lb (50.8 kg)   LMP 11/05/2004   SpO2 98% Comment: ON RA  BMI 17.54 kg/m  She looks thin as usual but in no distress. Lungs reveal decreased breath sounds throughout.   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 Current smoker.  EXAM: CT CHEST WITHOUT CONTRAST  TECHNIQUE: Multidetector CT imaging of the chest was performed following the standard protocol without intravenous contrast. High resolution imaging of the lungs, as well as inspiratory and expiratory imaging, was performed.  COMPARISON:  08/28/2015 chest CT.  FINDINGS: Cardiovascular: Normal heart size. No significant pericardial fluid/thickening. Left anterior descending and right coronary atherosclerosis. Atherosclerotic nonaneurysmal thoracic aorta. Normal caliber pulmonary arteries.  Mediastinum/Nodes: No discrete thyroid nodules. Unremarkable esophagus. No pathologically enlarged axillary, mediastinal or gross hilar lymph nodes, noting  limited sensitivity for the detection of hilar adenopathy on this noncontrast study.  Lungs/Pleura: No pneumothorax. No pleural effusion. Right upper lobectomy. In the posterior apical right hemithorax, there is a 7.3 x 6.7 cm mildly thick walled cavity with irregular mural nodularity (series 4/image 28), previously 8.0 x 6.7 cm on 08/28/2015, slightly decreased in size with stable mural nodularity. Apparent right middle lobe is nearly collapsed, unchanged. No acute consolidative airspace disease or new significant pulmonary nodules. Moderate to severe centrilobular and paraseptal emphysema. No significant lobular air trapping on the expiration sequence. No significant regions of subpleural reticulation, bronchiectasis, ground-glass attenuation, tree-in-bud opacities or frank honeycombing.  Upper abdomen: Unremarkable.  Musculoskeletal: No aggressive appearing focal osseous lesions. Mild thoracic spondylosis.  IMPRESSION: 1. Right upper lobectomy. Chronic mildly thick walled 7.3 cm cavity in the apical right hemithorax with stable irregular mural nodularity, slightly decreased in size, likely due to atypical mycobacterial infection given the history of previously resected cavitary nontuberculous mycobacterial infection. 2. No acute pulmonary disease. No significant bronchiectasis. No  evidence of interstitial lung disease. 3. Moderate to severe centrilobular and paraseptal emphysema. 4. Aortic atherosclerosis.  Two-vessel coronary atherosclerosis.   Electronically Signed   By: Ilona Sorrel M.D.   On: 10/28/2016 10:25      Impression:  I have reviewed her CT images and have shown them to her. I think she does have recurrent/persistent mycobacterial infection in the cavity in the apical right hemithorax. It is not clear if this is a large bleb in the lung itself or is in the pleural space. She does have several large blebs bilaterally. I think this large collection could  be giving her some pain in the chest but I doubt that it would be causing the the spasmodic lateral chest wall pain. I think that pain is more likely post-thoracotomy although post-thoracotomy pain usually is the worst after surgery and then gets progressively better over the next year, not getting worse over time. I told her that it would be worth investigating any available treatment at [facility] for nerve block because I don't see any other good option for her. She seems fairly disabled by this pain but keeps on going. I am concerned about the large cavity with debris suggesting persistent mycobacterial infection, especially with her history of recurrent hemoptysis. I am doubtful that her lung function is good enough to allow resection of this with the degree of emphysema I see on her CT and she says she would never agree to another surgery. I strongly encouraged her to quit smoking.  Plan:  She is probably going to follow up with pulmonary medicine at [facility]. I told her that I would be happy to see her back if she has any other questions.   [name], MD [facility] [phone]

## 2017-04-10 MED FILL — HYDROCODON-APAP 7.5-325: 7.5-325 | 22 days supply | Qty: 90 | Fill #0

## 2017-04-19 ENCOUNTER — Encounter: Payer: Self-pay | Admitting: Gynecology

## 2017-04-24 MED FILL — QVAR REDIHALER 80 MCG/ACT A: 80 | 30 days supply | Qty: 11 | Fill #0

## 2017-04-26 ENCOUNTER — Ambulatory Visit (INDEPENDENT_AMBULATORY_CARE_PROVIDER_SITE_OTHER): Payer: 59 | Admitting: Internal Medicine

## 2017-04-26 ENCOUNTER — Encounter: Payer: Self-pay | Admitting: Internal Medicine

## 2017-04-26 VITALS — BP 118/76 | HR 94 | Temp 98.8°F | Resp 14 | Ht 67.0 in | Wt 112.1 lb

## 2017-04-26 DIAGNOSIS — S8010XA Contusion of unspecified lower leg, initial encounter: Secondary | ICD-10-CM

## 2017-04-26 MED ORDER — HYDROCODONE-ACETAMINOPHEN 7.5-325 MG PO TABS: 1.0000 | ORAL_TABLET | Freq: Four times a day (QID) | ORAL | 0 refills | Status: DC | PRN

## 2017-04-26 NOTE — Progress Notes (Signed)
Pre visit review using our clinic review tool, if applicable. No additional management support is needed unless otherwise documented below in the visit note. 

## 2017-04-26 NOTE — Progress Notes (Signed)
Subjective:    Patient ID: [name] [name], female    DOB: Apr 20, 1956, 61 y.o.   MRN: [id number]  DOS:  04/26/2017 Type of visit - description : Acute visit Interval history: 9 days ago, noted some bruising at the R leg, slightly distal from the calf. The next day, she had a similar but less extensive bruising on the left leg, basically on a symmetric fashion. Since then, the bruising become more intense in color but now is decreasing. No know  injury. She does have mild pitting edema.  Review of Systems Denies any fever chills. No claudication No leg injury No recent airplane trip or prolonged car trip. No nosebleeding. No blood in the urine of the stools. He occasionally has bruises in other places and that has not increased in frequency.  Past Medical History:  Diagnosis Date  . Arthritis   . B12 deficiency   . Bronchiectasis ([place])   . Cataracts, bilateral   . Colitis    induced by decongestants, NSAIds  . COPD (chronic obstructive pulmonary disease) ([place])    per CT 11/10/05, will minimal bronchiectasis rll  . Endometriosis   . GERD (gastroesophageal reflux disease)   . 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 Hemorrhoid   . Lichen planus   . 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 Osteoporosis    dexa per gyn  . Pneumothorax, spontaneous, tension    x 2 in her 20's.  has a chest tube for one  . Shoulder dislocation 2015    R shoulder , x 2   . 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 ESOPHAGOGASTRODUODENOSCOPY  12/03/2012   Procedure: ESOPHAGOGASTRODUODENOSCOPY (EGD);  Surgeon: [name], MD;  Location: [facility] ENDOSCOPY;  Service: Endoscopy;  Laterality: N/A;  . FLEXIBLE BRONCHOSCOPY  11/05/2012   Procedure: FLEXIBLE BRONCHOSCOPY;  Surgeon: [name], MD;  Location: [place];  Service: Thoracic;  Laterality: N/A;  . KNEE SURGERY      .not replacement .x8  . LOBECTOMY  11/05/2012   Procedure: LOBECTOMY;  Surgeon: [name], MD;  Location: [facility] OR;  Service: Thoracic;  Laterality: Right;  right upper lobectomy, and part of the right lower lobe  . MEDIASTINOSCOPY  05/25/2012   Procedure: MEDIASTINOSCOPY;  Surgeon: [name], MD;  Location: [facility] OR;  Service: Thoracic;  Laterality: N/A;  . ovarian cyst removed    . THORACOTOMY  11/05/2012   Procedure: THORACOTOMY MAJOR;  Surgeon: [name], MD;  Location: [facility] OR;  Service: Thoracic;  Laterality: Right;  . TONSILLECTOMY    . VIDEO BRONCHOSCOPY  02/15/2012   Procedure: VIDEO BRONCHOSCOPY WITH FLUORO;  Surgeon: [name], MD;  Location: [facility] ENDOSCOPY;  Service: Endoscopy;  Laterality: Bilateral;  WASHING- INTERVENTION BIOPSIES INTERVENTION BRONCHOSCOPY WITH VIDEO    Social History   Social History  . Marital status: Single    Spouse name: N/A  . Number of children: 0  . Years of education: N/A   Occupational History  . 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 [employer]   Social History Main Topics  . Smoking status: Current Every Day Smoker    Packs/day: 0.40    Years: 38.00    Types: Cigarettes  . Smokeless tobacco: Never Used     Comment: ~  1 ppd   . Alcohol use 4.2 oz/week    7 Glasses of wine per week  . Drug use: No  . Sexual activity: Not Currently     Comment: 1st intercourse 61 yo-More than 5 partners   Other Topics Concern  . Not on file   Social History Narrative   sister lives w/ pt    Lost mom 12-2013      Allergies as of 04/26/2017      Reactions   Alendronate Sodium Other (See Comments)   chest pain   Antihistamines, Chlorpheniramine-type Other (See Comments)   "makes lungs bleed"   Benadryl [diphenhydramine] Other (See Comments)   "makes lungs bleed" (patient states she can tolerate the newer ones, like Claritin)   Nsaids Other (See Comments)   ischemic colitis   Other Other (See Comments)   (All  anti-inflammatories) ischemic colitis   Pheniramine Other (See Comments)   "makes lungs bleed"   Pseudoephedrine Other (See Comments)   Ischemic colitis   Levofloxacin Anxiety   Robitussin A-c [guaifenesin-codeine] Itching, Other (See Comments)   Palms itching      Medication List       Accurate as of 04/26/17 11:59 PM. Always use your most recent med list.          acetaminophen 500 MG tablet Commonly known as:  TYLENOL Take 500 mg by mouth every 4 (four) hours as needed for moderate pain or fever. For fever   ALPRAZolam 0.5 MG tablet Commonly known as:  XANAX Take 0.5-1 tablets (0.25-0.5 mg total) by mouth at bedtime as needed for sleep.   aspirin 81 MG chewable tablet Chew 81 mg by mouth daily.   HYDROcodone-acetaminophen 7.5-325 MG tablet Commonly known as:  NORCO Take 1 tablet by mouth every 6 (six) hours as needed for moderate pain.   LYSINE PO Take 1 tablet by mouth daily.   Magnesium 400 MG Tabs Take 1 tablet by mouth daily.   methocarbamol 500 MG tablet Commonly known as:  ROBAXIN Take 1 tablet (500 mg total) by mouth every 8 (eight) hours as needed for muscle spasms.   NICORETTE 4 MG lozenge Generic drug:  nicotine polacrilex Place 4 mg inside cheek every 3 (three) hours as needed for smoking cessation. For nicotine cravings.   pantoprazole 40 MG tablet Commonly known as:  PROTONIX Take 1 tablet (40 mg total) by mouth 2 (two) times daily before a meal.   Potassium Gluconate 595 MG Caps Take 1 capsule by mouth daily.   PROVENTIL HFA 108 (90 Base) MCG/ACT inhaler Generic drug:  albuterol Inhale 2 puffs into the lungs every 4 (four) hours as needed for wheezing or shortness of breath.   QVAR REDIHALER 80 MCG/ACT Aerb Generic drug:  Beclomethasone Diprop HFA Inhale 2 puffs into the lungs 2 (two) times daily.   VITAMIN D PO Take 1 tablet by mouth daily.          Objective:   Physical Exam BP 118/76 (BP Location: Left Arm, Patient Position:  Sitting, Cuff Size: Normal)   Pulse 94   Temp 98.8 F (37.1 C) (Oral)   Resp 14   Ht _0  (1.702 m)   Wt 112 lb 2 oz (50.9 kg)   LMP 11/05/2004   SpO2 96%   BMI 17.56 kg/m  General:   Well developed, well nourished . NAD.  HEENT:  Normocephalic . Face symmetric, atraumatic Lower extremities: Trace pretibial edema. She has some bruising and the posterior aspect of  both legs by the Achilles tendon, worse on the right. Achilles tendon per se is intact to palpation. See picture. Palpation of the calf is  normal, flexion and extension of the ankle normal. Good pedal pulses. Skin: Not pale. Not jaundice Neurologic:  alert & oriented X3.  Speech normal, gait appropriate for age and unassisted Psych--  Cognition and judgment appear intact.  Cooperative with normal attention span and concentration.  Behavior appropriate. No anxious or depressed appearing.        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 No evidence of DVT on clinical grounds, no PVD. For now recommend observation, call if something changes.

## 2017-04-27 NOTE — Assessment & Plan Note (Signed)
Bruising R>L leg: Etiology unclear, no injury, bruising is evolving as expected. No evidence of DVT on clinical grounds, no PVD. For now recommend observation, call if something changes.

## 2017-04-28 ENCOUNTER — Ambulatory Visit (INDEPENDENT_AMBULATORY_CARE_PROVIDER_SITE_OTHER): Payer: 59 | Admitting: Internal Medicine

## 2017-04-28 ENCOUNTER — Encounter: Payer: Self-pay | Admitting: Internal Medicine

## 2017-04-28 VITALS — BP 116/64 | HR 80 | Temp 98.7°F | Resp 14 | Ht 67.0 in | Wt 112.1 lb

## 2017-04-28 DIAGNOSIS — Z0000 Encounter for general adult medical examination without abnormal findings: Secondary | ICD-10-CM

## 2017-04-28 DIAGNOSIS — E559 Vitamin D deficiency, unspecified: Secondary | ICD-10-CM | POA: Diagnosis not present

## 2017-04-28 DIAGNOSIS — R7303 Prediabetes: Secondary | ICD-10-CM

## 2017-04-28 LAB — COMPREHENSIVE METABOLIC PANEL
ALT: 13 U/L (ref 0–35)
AST: 17 U/L (ref 0–37)
Albumin: 4.7 g/dL (ref 3.5–5.2)
Alkaline Phosphatase: 51 U/L (ref 39–117)
BUN: 11 mg/dL (ref 6–23)
CO2: 28 mEq/L (ref 19–32)
Calcium: 9.7 mg/dL (ref 8.4–10.5)
Chloride: 104 mEq/L (ref 96–112)
Creatinine, Ser: 0.74 mg/dL (ref 0.40–1.20)
GFR: 85.37 mL/min (ref >60.00)
Glucose, Bld: 104 mg/dL — ABNORMAL HIGH (ref 70–99)
Potassium: 4.4 mEq/L (ref 3.5–5.1)
Sodium: 139 mEq/L (ref 135–145)
Total Bilirubin: 0.4 mg/dL (ref 0.2–1.2)
Total Protein: 7.5 g/dL (ref 6.0–8.3)

## 2017-04-28 LAB — CBC WITH DIFFERENTIAL/PLATELET
BASOS ABS: 0.1 10*3/uL (ref 0.0–0.1)
Basophils Relative: 1 % (ref 0.0–3.0)
EOS ABS: 0.2 10*3/uL (ref 0.0–0.7)
Eosinophils Relative: 2.1 % (ref 0.0–5.0)
HCT: 40.2 % (ref 36.0–46.0)
Hemoglobin: 13.8 g/dL (ref 12.0–15.0)
LYMPHS ABS: 1.6 10*3/uL (ref 0.7–4.0)
Lymphocytes Relative: 19 % (ref 12.0–46.0)
MCHC: 34.3 g/dL (ref 30.0–36.0)
MCV: 106.4 fl — AB (ref 78.0–100.0)
MONOS PCT: 6.5 % (ref 3.0–12.0)
Monocytes Absolute: 0.6 10*3/uL (ref 0.1–1.0)
NEUTROS ABS: 6.1 10*3/uL (ref 1.4–7.7)
NEUTROS PCT: 71.4 % (ref 43.0–77.0)
PLATELETS: 402 10*3/uL — AB (ref 150.0–400.0)
RBC: 3.78 Mil/uL — AB (ref 3.87–5.11)
RDW: 12.8 % (ref 11.5–15.5)
WBC: 8.5 10*3/uL (ref 4.0–10.5)

## 2017-04-28 LAB — LIPID PANEL
CHOL/HDL RATIO: 2
Cholesterol: 183 mg/dL (ref 0–200)
HDL: 83.9 mg/dL (ref >39.00)
LDL CALC: 89 mg/dL (ref 0–99)
NONHDL: 99.51
Triglycerides: 51 mg/dL (ref 0.0–149.0)
VLDL: 10.2 mg/dL (ref 0.0–40.0)

## 2017-04-28 LAB — HEMOGLOBIN A1C: Hgb A1c MFr Bld: 5.6 % (ref 4.6–6.5)

## 2017-04-28 NOTE — Progress Notes (Signed)
Subjective:    Patient ID: [name] [name], female    DOB: Aug 10, 1956, 62 y.o.   MRN: [id number]  DOS:  04/28/2017 Type of visit - description : cpx Interval history: Currently medically stable, no major or acute concerns Occasional difficulty sleeping Osteoporosis: Still declines treatment.   Review of Systems No fever chills Has right-sided chest wall pain and difficulty breathing, both are chronic, SOB getting worse over the years. + Cough, no recent hemoptysis. Still smoking, no ready to quit   Other than above, a 14 point review of systems is negative     Past Medical History:  Diagnosis Date  . Arthritis   . B12 deficiency   . Bronchiectasis ([place])   . Cataracts, bilateral   . Colitis    induced by decongestants, NSAIds  . COPD (chronic obstructive pulmonary disease) ([place])    per CT 11/10/05, will minimal bronchiectasis rll  . Endometriosis   . GERD (gastroesophageal reflux disease)   . 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 Hemorrhoid   . Lichen planus   . 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 Osteoporosis    dexa per gyn  . Pneumothorax, spontaneous, tension    x 2 in her 20's.  has a chest tube for one  . Shoulder dislocation 2015    R shoulder , x 2   . 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 ESOPHAGOGASTRODUODENOSCOPY  12/03/2012   Procedure: ESOPHAGOGASTRODUODENOSCOPY (EGD);  Surgeon: [name], MD;  Location: [facility] ENDOSCOPY;  Service: Endoscopy;  Laterality: N/A;  . FLEXIBLE BRONCHOSCOPY  11/05/2012   Procedure: FLEXIBLE BRONCHOSCOPY;  Surgeon: [name], MD;  Location: [place];  Service: Thoracic;  Laterality: N/A;  . KNEE SURGERY     .not replacement .x8  . LOBECTOMY  11/05/2012   Procedure: LOBECTOMY;  Surgeon: [name], MD;  Location: [facility] OR;  Service: Thoracic;  Laterality: Right;  right upper  lobectomy, and part of the right lower lobe  . MEDIASTINOSCOPY  05/25/2012   Procedure: MEDIASTINOSCOPY;  Surgeon: [name], MD;  Location: [facility] OR;  Service: Thoracic;  Laterality: N/A;  . ovarian cyst removed    . THORACOTOMY  11/05/2012   Procedure: THORACOTOMY MAJOR;  Surgeon: [name], MD;  Location: [facility] OR;  Service: Thoracic;  Laterality: Right;  . TONSILLECTOMY    . VIDEO BRONCHOSCOPY  02/15/2012   Procedure: VIDEO BRONCHOSCOPY WITH FLUORO;  Surgeon: [name], MD;  Location: [facility] ENDOSCOPY;  Service: Endoscopy;  Laterality: Bilateral;  WASHING- INTERVENTION BIOPSIES INTERVENTION BRONCHOSCOPY WITH VIDEO   Family History  Problem Relation Age of Onset  . Diabetes Mother        GM  . Heart disease Mother        in her 12s  . Heart disease Maternal Grandfather   . Cancer Father        sarcoma  . Diabetes Maternal Uncle   . Diabetes Maternal Grandmother   . Colon cancer Neg Hx   . Breast cancer Neg Hx   . Esophageal cancer Neg Hx   . Rectal cancer Neg Hx   . Stomach cancer Neg Hx     Social History   Social History  . Marital status: Single    Spouse name: N/A  . Number of children: 0  . Years  of education: N/A   Occupational History  . 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 Smoking status: Current Every Day Smoker    Packs/day: 0.40    Years: 38.00    Types: Cigarettes  . Smokeless tobacco: Never Used     Comment: ~ 1 ppd   . Alcohol use 4.2 oz/week    7 Glasses of wine per week     Comment: daily, 1 seving  . Drug use: No  . Sexual activity: Not Currently     Comment: 1st intercourse 61 yo-More than 5 partners   Other Topics Concern  . Not on file   Social History Narrative   sister lives w/ pt    Lost mom 12-2013      Allergies as of 04/28/2017      Reactions   Alendronate Sodium Other (See Comments)   chest pain   Antihistamines, Chlorpheniramine-type Other (See Comments)   "makes lungs bleed"   Benadryl  [diphenhydramine] Other (See Comments)   "makes lungs bleed" (patient states she can tolerate the newer ones, like Claritin)   Nsaids Other (See Comments)   ischemic colitis   Other Other (See Comments)   (All anti-inflammatories) ischemic colitis   Pheniramine Other (See Comments)   "makes lungs bleed"   Pseudoephedrine Other (See Comments)   Ischemic colitis   Levofloxacin Anxiety   Robitussin A-c [guaifenesin-codeine] Itching, Other (See Comments)   Palms itching      Medication List       Accurate as of 04/28/17 11:59 PM. Always use your most recent med list.          acetaminophen 500 MG tablet Commonly known as:  TYLENOL Take 500 mg by mouth every 4 (four) hours as needed for moderate pain or fever. For fever   ALPRAZolam 0.5 MG tablet Commonly known as:  XANAX Take 0.5-1 tablets (0.25-0.5 mg total) by mouth at bedtime as needed for sleep.   aspirin 81 MG chewable tablet Chew 81 mg by mouth daily.   HYDROcodone-acetaminophen 7.5-325 MG tablet Commonly known as:  NORCO Take 1 tablet by mouth every 6 (six) hours as needed for moderate pain.   LYSINE PO Take 1 tablet by mouth daily.   Magnesium 400 MG Tabs Take 1 tablet by mouth daily.   methocarbamol 500 MG tablet Commonly known as:  ROBAXIN Take 1 tablet (500 mg total) by mouth every 8 (eight) hours as needed for muscle spasms.   NICORETTE 4 MG lozenge Generic drug:  nicotine polacrilex Place 4 mg inside cheek every 3 (three) hours as needed for smoking cessation. For nicotine cravings.   pantoprazole 40 MG tablet Commonly known as:  PROTONIX Take 1 tablet (40 mg total) by mouth 2 (two) times daily before a meal.   Potassium Gluconate 595 MG Caps Take 1 capsule by mouth daily.   PROVENTIL HFA 108 (90 Base) MCG/ACT inhaler Generic drug:  albuterol Inhale 2 puffs into the lungs every 4 (four) hours as needed for wheezing or shortness of breath.   QVAR REDIHALER 80 MCG/ACT Aerb Generic drug:   Beclomethasone Diprop HFA Inhale 2 puffs into the lungs 2 (two) times daily.   VITAMIN D PO Take 1 tablet by mouth daily.          Objective:   Physical Exam BP 116/64 (BP Location: Left Arm, Patient Position: Sitting, Cuff Size: Small)   Pulse 80   Temp 98.7 F (  37.1 C) (Oral)   Resp 14   Ht _0  (1.702 m)   Wt 112 lb 2 oz (50.9 kg)   LMP 11/05/2004   SpO2 98%   BMI 17.56 kg/m   General:   Well developed, underweight appearing. No distress Neck: No  thyromegaly  HEENT:  Normocephalic . Face symmetric, atraumatic Lungs:  CTA B Normal respiratory effort, no intercostal retractions, no accessory muscle use. Heart: RRR,  no murmur.  No pretibial edema bilaterally  Abdomen:  Not distended, soft, non-tender. No rebound or rigidity.   Skin: Exposed areas without rash. Not pale. Not jaundice Neurologic:  alert & oriented X3.  Speech normal, gait unassisted but very limited due to DJD Strength symmetric and appropriate for age.  Psych: Cognition and judgment appear intact.  Cooperative with normal attention span and concentration.  Behavior appropriate. No anxious or depressed appearing.    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 I told patient that in my experience s/e are extremely uncommon. She still declined treatment. Vitamin D and B12 deficiency: On oral supplements, check a vitamin D level Insomnia: Often times takes just half Xanax and later on the second half, still has issues. Alternatives?Marland Kitchen She won't take Ambien due to s/e potential thus rec to take 1 Xanax  instead of half and see how she does. Consider trazodone. DJD: On hydrocodone 7.53 times daily. Declined it to try a lower dose. UDS today. Pulmonary: Has a complicated history, follow-up by Dr. Olena Heckle. Has chronic chest pain and difficulty breathing. RTC 1 year

## 2017-04-28 NOTE — Assessment & Plan Note (Addendum)
--  Td 2015 ;pneumonia shot 07, 2013 ; prevnar - 2015  ; shingles shot d/w pt, will think about it -- CCS: Cscope 4-09 , 1 polyp (@ [place])  cscope 09-2014, + polyps, next 2020 --Female care  PAP (-) 06-2915, [facility] MMGs--06-2015 neg, rec a MMG, pt states will schedule  --Diet, exercise, tobacco quitting discussed. She is not ready to quit tobacco. --Labs: CMP, FLP, CBC, A1c, vitamin D

## 2017-04-28 NOTE — Patient Instructions (Addendum)
GO TO THE LAB : Get the blood work     GO TO THE FRONT DESK Schedule your next appointment for a  checkup in one year  

## 2017-04-28 NOTE — Progress Notes (Signed)
Pre visit review using our clinic review tool, if applicable. No additional management support is needed unless otherwise documented below in the visit note. 

## 2017-04-30 LAB — VITAMIN D 1,25 DIHYDROXY
Vitamin D 1, 25 (OH)2 Total: 50 pg/mL (ref 18–72)
Vitamin D2 1, 25 (OH)2: <8 pg/mL
Vitamin D3 1, 25 (OH)2: 50 pg/mL

## 2017-04-30 NOTE — Assessment & Plan Note (Signed)
Recently seen w/ bruising LEs, improving Hyperglycemia: Check A1c Osteoporosis: When asked, reports that she is afraid of prolia s/e, particularly pain. I told patient that in my experience s/e are extremely uncommon. She still declined treatment. Vitamin D and B12 deficiency: On oral supplements, check a vitamin D level Insomnia: Often times takes just half Xanax and later on the second half, still has issues. Alternatives?Marland Kitchen She won't take Ambien due to s/e potential thus rec to take 1 Xanax  instead of half and see how she does. Consider trazodone. DJD: On hydrocodone 7.53 times daily. Declined it to try a lower dose. UDS today. Pulmonary: Has a complicated history, follow-up by Dr. Olena Heckle. Has chronic chest pain and difficulty breathing. RTC 1 year

## 2017-05-09 DIAGNOSIS — J479 Bronchiectasis, uncomplicated: Secondary | ICD-10-CM | POA: Diagnosis not present

## 2017-05-09 DIAGNOSIS — G8912 Acute post-thoracotomy pain: Secondary | ICD-10-CM | POA: Diagnosis not present

## 2017-05-09 MED FILL — AMOX TR-K CLV 875-125 MG TA: 875-125 | 42 days supply | Qty: 84 | Fill #0

## 2017-05-11 MED FILL — HYDROCODON-APAP 7.5-325: 7.5-325 | 22 days supply | Qty: 90 | Fill #0

## 2017-05-25 ENCOUNTER — Encounter: Payer: Self-pay | Admitting: Physical Medicine & Rehabilitation

## 2017-05-28 ENCOUNTER — Ambulatory Visit (INDEPENDENT_AMBULATORY_CARE_PROVIDER_SITE_OTHER): Payer: Self-pay | Admitting: Family

## 2017-05-28 VITALS — BP 110/75 | HR 95 | Temp 99.0°F | Resp 18

## 2017-05-28 DIAGNOSIS — B3731 Acute candidiasis of vulva and vagina: Secondary | ICD-10-CM

## 2017-05-28 DIAGNOSIS — J029 Acute pharyngitis, unspecified: Secondary | ICD-10-CM

## 2017-05-28 DIAGNOSIS — B373 Candidiasis of vulva and vagina: Secondary | ICD-10-CM

## 2017-05-28 MED ORDER — FLUCONAZOLE 150 MG PO TABS: 150.0000 mg | ORAL_TABLET | Freq: Once | ORAL | 0 refills | Status: AC

## 2017-05-28 MED ORDER — BENZONATATE 100 MG PO CAPS: 100.0000 mg | ORAL_CAPSULE | Freq: Three times a day (TID) | ORAL | 0 refills | Status: DC | PRN

## 2017-05-28 MED ORDER — PREDNISONE 5 MG PO TABS: 5.0000 mg | ORAL_TABLET | ORAL | 0 refills | Status: DC

## 2017-05-28 NOTE — Progress Notes (Signed)
[name] [name]  Chief Complaint  Patient presents with  . Fever      ICD-10-CM   1. Viral pharyngitis J02.9     Patient Active Problem List   Diagnosis Date Noted  . Viral pharyngitis 05/28/2017  . PCP NOTES >>>>>>>>>>>>>>>>>>>>>>>> 11/24/2015  . Subclavian vein obstruction (HCC)   . Obstruction of vein 09/25/2015  . Arm swelling ---R--- 07/20/2015  . Upper back pain 03/31/2015  . Right shoulder injury 01/06/2015  . Prediabetes 04/02/2014  . 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 Avulsion fracture of trochanter of femur ([place]) 10/17/2013  . Anxiety 10/17/2013  . Intertrochanteric fracture of femur ([place]) 10/17/2013  . Other dysphagia 12/03/2012  . Odynophagia 11/28/2012  . 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 S/P lobectomy of lung 11/18/2012  . History of surgical procedure 11/18/2012  . Weight loss 12/13/2011  . Annual physical exam 06/23/2011  . Insomnia 11/22/2010  . PALPITATIONS 02/27/2009  . CIGARETTE SMOKER 12/24/2007  . BRONCHIECTASIS 12/24/2007  . ISCHEMIC COLITIS 12/24/2007  . HEPATIC CYST 12/24/2007  . HEADACHES, HX OF 12/24/2007  . Vascular insufficiency of intestine ([place]) 12/24/2007  . B12 DEFICIENCY 12/30/2006  . ASTHMA 12/30/2006  . COPD GOLD 0  12/30/2006  . GERD 12/30/2006  . ENDOMETRIOSIS NOS 12/30/2006  . LICHEN PLANUS 19/62/2297  . Osteoarthritis -- pain mngmt--UDS 12/30/2006  . Osteoporosis 12/30/2006    Past Medical History:  Diagnosis Date  . Arthritis   . B12 deficiency   . Bronchiectasis ([place])   . Cataracts, bilateral   . Colitis    induced by decongestants, NSAIds  . COPD (chronic obstructive pulmonary disease) ([place])    per CT 11/10/05, will minimal bronchiectasis rll  . Endometriosis   . GERD (gastroesophageal reflux disease)   . 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 Hemorrhoid   . Lichen planus   . 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  Osteoporosis    dexa per gyn  . Pneumothorax, spontaneous, tension    x 2 in her 20's.  has a chest tube for one  . Shoulder dislocation 2015    R shoulder , x 2   . 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 ESOPHAGOGASTRODUODENOSCOPY  12/03/2012   Procedure: ESOPHAGOGASTRODUODENOSCOPY (EGD);  Surgeon: [name], MD;  Location: [facility] ENDOSCOPY;  Service: Endoscopy;  Laterality: N/A;  . FLEXIBLE BRONCHOSCOPY  11/05/2012   Procedure: FLEXIBLE BRONCHOSCOPY;  Surgeon: [name], MD;  Location: [facility];  Service: Thoracic;  Laterality: N/A;  . KNEE SURGERY     .not replacement .x8  . LOBECTOMY  11/05/2012   Procedure: LOBECTOMY;  Surgeon: [name], MD;  Location: [facility] OR;  Service: Thoracic;  Laterality: Right;  right upper lobectomy, and part of the right lower lobe  . MEDIASTINOSCOPY  05/25/2012   Procedure: MEDIASTINOSCOPY;  Surgeon: [name], MD;  Location: [facility] OR;  Service: Thoracic;  Laterality: N/A;  . ovarian cyst removed    . THORACOTOMY  11/05/2012   Procedure: THORACOTOMY MAJOR;  Surgeon: [name], MD;  Location[facility] OR;  Service: Thoracic;  Laterality: Right;  . TONSILLECTOMY    . VIDEO BRONCHOSCOPY  02/15/2012   Procedure: VIDEO BRONCHOSCOPY WITH FLUORO;  Surgeon: [name], MD;  Location: [facility] ENDOSCOPY;  Service: Endoscopy;  Laterality: Bilateral;  WASHING- INTERVENTION BIOPSIES INTERVENTION BRONCHOSCOPY  WITH VIDEO    Allergies  Allergen Reactions  . Alendronate Sodium Other (See Comments)    chest pain  . Antihistamines, Chlorpheniramine-Type Other (See Comments)    "makes lungs bleed"  . Benadryl [Diphenhydramine] Other (See Comments)    "makes lungs bleed" (patient states she can tolerate the newer ones, like Claritin)  . Nsaids Other (See Comments)    ischemic colitis  . Other Other (See Comments)    (All anti-inflammatories) ischemic colitis  . Pheniramine Other (See Comments)     "makes lungs bleed"  . Pseudoephedrine Other (See Comments)    Ischemic colitis  . Levofloxacin Anxiety  . Robitussin A-C [Guaifenesin-Codeine] Itching and Other (See Comments)    Palms itching    BP 110/75 (BP Location: Right Arm, Patient Position: Sitting)   Pulse 95   Temp 99 F (37.2 C) (Oral)   Resp 18   LMP 11/05/2004   SpO2 98%   Review of Systems  Respiratory: Positive for cough, sputum production (green), shortness of breath and wheezing.   All other systems reviewed and are negative.  Physical Exam  Constitutional: She is oriented to person, place, and time. She appears well-developed and well-nourished.  HENT:  Head: Normocephalic and atraumatic.  Eyes: Conjunctivae and EOM are normal. Pupils are equal, round, and reactive to light.  Neck: Normal range of motion. Neck supple.  Cardiovascular: Normal rate, regular rhythm, normal heart sounds and intact distal pulses.   Pulmonary/Chest: Effort normal and breath sounds normal. No respiratory distress. She has no wheezes. She has no rales. She exhibits no tenderness.  Abdominal: Soft. Bowel sounds are normal.  Musculoskeletal: Normal range of motion.  Neurological: She is alert and oriented to person, place, and time.  Skin: Skin is warm and dry. Capillary refill takes less than 2 seconds.  Psychiatric: She has a normal mood and affect. Her behavior is normal.    No results found for this or any previous visit (from the past 72 hour(s)).   Current Outpatient Prescriptions:  .  amoxicillin-clavulanate (AUGMENTIN) 875-125 MG tablet, Take 1 tablet by mouth 2 (two) times daily., Disp: , Rfl:  .  magnesium oxide (MAG-OX) 400 MG tablet, Take 400 mg by mouth daily., Disp: , Rfl:  .  acetaminophen (TYLENOL) 500 MG tablet, Take 500 mg by mouth every 4 (four) hours as needed for moderate pain or fever. For fever, Disp: , Rfl:  .  albuterol (PROVENTIL HFA) 108 (90 Base) MCG/ACT inhaler, Inhale 2 puffs into the lungs every 4  (four) hours as needed for wheezing or shortness of breath., Disp: , Rfl:  .  ALPRAZolam (XANAX) 0.5 MG tablet, Take 0.5-1 tablets (0.25-0.5 mg total) by mouth at bedtime as needed for sleep., Disp: 90 tablet, Rfl: 0 .  aspirin 81 MG chewable tablet, Chew 81 mg by mouth daily., Disp: , Rfl:  .  Cholecalciferol (VITAMIN D PO), Take 1 tablet by mouth daily., Disp: , Rfl:  .  HYDROcodone-acetaminophen (NORCO) 7.5-325 MG tablet, Take 1 tablet by mouth every 6 (six) hours as needed for moderate pain., Disp: 90 tablet, Rfl: 0 .  LYSINE PO, Take 1 tablet by mouth daily., Disp: , Rfl:  .  Magnesium 400 MG TABS, Take 1 tablet by mouth daily., Disp: , Rfl:  .  methocarbamol (ROBAXIN) 500 MG tablet, Take 1 tablet (500 mg total) by mouth every 8 (eight) hours as needed for muscle spasms. (Patient taking differently: Take 250 mg by mouth every 8 (eight) hours as needed  for muscle spasms. ), Disp: 90 tablet, Rfl: 1 .  nicotine polacrilex (NICORETTE) 4 MG lozenge, Place 4 mg inside cheek every 3 (three) hours as needed for smoking cessation. For nicotine cravings., Disp: , Rfl:  .  pantoprazole (PROTONIX) 40 MG tablet, Take 1 tablet (40 mg total) by mouth 2 (two) times daily before a meal., Disp: 60 tablet, Rfl: 12 .  Potassium Gluconate 595 MG CAPS, Take 1 capsule by mouth daily., Disp: , Rfl:  .  QVAR REDIHALER 80 MCG/ACT AERB, Inhale 2 puffs into the lungs 2 (two) times daily., Disp: , Rfl: 11  Subjective: "I have been sick for about 4 days, been on Augmentin 18 days from a pulmonologist already for some other problems. I have been running a fever, sweating for 3-4 days and it was 101.8 on day 3, but responding to Tylenol and lower now. I work at [employer] in [place]."  Objective: Pt seen and chart reviewed. Pt is alert, oriented x3, calm, cooperative, in NAD. See narrative above. Flu test negative. Symptoms x4 days and some improvement.  Assessment:  -Acute viral pharyngitis  Plan:  -Generic 39m sterapred 21  taper -Tessalon Perles 1030m po q8h prn cough -Diflucan 1506mo if yeast infection occurs from long-term augmentin that she is on.  WitBenjamine MolaNP 05/28/2017 11:04 AM

## 2017-06-02 ENCOUNTER — Other Ambulatory Visit: Payer: Self-pay | Admitting: Internal Medicine

## 2017-06-02 MED ORDER — HYDROCODONE-ACETAMINOPHEN 7.5-325 MG PO TABS: 1.0000 | ORAL_TABLET | Freq: Four times a day (QID) | ORAL | 0 refills | Status: DC | PRN

## 2017-06-02 NOTE — Telephone Encounter (Signed)
Pt is requesting refill on hydrocodone 7.5-325mg .  Last OV: 04/28/2017 Last Fill: 04/26/2017 #90 and 0RF Pt sig: 1 tab q6h prn UDS: 04/22/2016 Low risk  Please advise.   [place] Controlled Substance Database printed.

## 2017-06-02 NOTE — Telephone Encounter (Signed)
Pt informed that Rx's have been placed at front desk for pick up at her convenience via [place].

## 2017-06-02 NOTE — Telephone Encounter (Signed)
Ok 90 and 1 RF 

## 2017-06-08 MED FILL — PANTOPRAZOLE SOD DR 40 MG T: 40 | 30 days supply | Qty: 60 | Fill #1

## 2017-06-09 MED FILL — HYDROCODON-APAP 7.5-325: 7.5-325 | 22 days supply | Qty: 90 | Fill #0

## 2017-06-27 ENCOUNTER — Encounter: Payer: Self-pay | Admitting: Physical Medicine & Rehabilitation

## 2017-06-27 ENCOUNTER — Encounter: Payer: 59 | Attending: Physical Medicine & Rehabilitation

## 2017-06-27 ENCOUNTER — Ambulatory Visit (HOSPITAL_BASED_OUTPATIENT_CLINIC_OR_DEPARTMENT_OTHER): Payer: 59 | Admitting: Physical Medicine & Rehabilitation

## 2017-06-27 VITALS — BP 137/85 | HR 85

## 2017-06-27 DIAGNOSIS — K219 Gastro-esophageal reflux disease without esophagitis: Secondary | ICD-10-CM | POA: Diagnosis not present

## 2017-06-27 DIAGNOSIS — G588 Other specified mononeuropathies: Secondary | ICD-10-CM | POA: Insufficient documentation

## 2017-06-27 DIAGNOSIS — G8912 Acute post-thoracotomy pain: Secondary | ICD-10-CM | POA: Insufficient documentation

## 2017-06-27 DIAGNOSIS — F17210 Nicotine dependence, cigarettes, uncomplicated: Secondary | ICD-10-CM | POA: Diagnosis not present

## 2017-06-27 DIAGNOSIS — J449 Chronic obstructive pulmonary disease, unspecified: Secondary | ICD-10-CM | POA: Diagnosis not present

## 2017-06-27 NOTE — Patient Instructions (Signed)
Intercostal Nerve Block Patient Information  Description: The twelve intercostal nerves arise from the first thru twelfth thoracic nerve roots.  The nerve begins at the spine and wraps around the body, lying in a groove underneath each rib.  Each intercostal nerve innervates a specific strip of skin and body walk of the abdomen and chest.  Therefore, injuries of the chest wall or abdominal wall result in pain that is transmitted back to the brian via the intercostal nerves.  Examples of such injuries include rib fractures and incisions for lung and gall bladder surgery.  Occasionally, pain may persist long after an injury or surgical incision secondary to inflammation and irritation of the intercostal nerve.  The longstanding pain is known as intercostal neuralgia.  An intercostal nerve block is preformed to eliminate pain either temporarily or permanently.  A small needle is placed below the rib and local anesthetic (like Novocaine) and possibly steroid is injected.  Usually 2-4 intercostal nerves are blocked at a time depending on the problem.  The patient will experience a slight "pin-prick" sensation for each injection.  Shortly thereafter, the strip of skin that is innervated by the blocked intercostal nerve will feel numb.  Persistent pain that is only temporarily relieved with local anesthetic may require a more permanent block. This procedure is called Cryoneurolysis and entails placing a small probe beneath the rib to freeze the nerve.  Conditions that may be treated by intercostal nerve blocks:   Rib fractures  Longstanding pain from surgery of the chest or abdomen (intercostal neuralgia)  Pain from chest tubes  Pain from trauma to the chest  Preparation for the injections:  1. Do not eat any solid food or dairy products within 8 hours of your appointment. 2. You may drink clear liquids up to 3 hours before appointment.  Clear liquids include water, black coffee, juice or soda.  No milk  or cream please. 3. You may take your regular medication, including pain medications, with a sip of water before your appointment.  Diabetics should hold regular insulin (if take separately) and take 1/2 normal NPH dose the morning of the procedure.   Carry some sugar containing items with you to your appointment. 4. A driver must accompany you and be prepared to drive you home after your procedure. 5. Bring all your current medications with you. 6. An IV may be inserted and sedation may be given at the discretion of the physician. 7. A blood pressure cuff, EKG and other monitors will often be applied during the procedure.  Some patients may need to have extra oxygen administered for a short period. 8. You will be asked to provide medical information, including your allergies, prior to the procedure.  We must know immediately if you are taking blood thinners (like Coumadin/Warfarin) or if you are allergic to IV iodine contrast (dye). We must know if you could possible be pregnant.  Possible side-effects:   Bleeding from needle site  Infection (rare)  Nerve injury (rare)  Numbness & tingling of skin  Collapsed lung requiring chest tube (rare)  Local anesthetic toxicity (rare)  Light-headedness (temporary)  Pain at injection site (several days)  Decreased blood pressure (temporary)  Shortness of breath  Jittery/shaking sensation (temporary)  Call if you experience:   Difficulty breathing or hives (go directly to the emergency room)  Redness, inflammation or drainage at the injection site  Severe pain at the site of the injection  Any new symptoms which are concerning   Please note:  Your pain may subside immediately but may return several hours after the injection.  Often, more than one injection is required to reduce the pain. Also, if several temporary blocks with local anesthetic are ineffective, a more permanent block with cryolysis may be necessary.  This will be  discussed with you should this be the case.  If you have any questions, please call [phone] [facility]

## 2017-06-27 NOTE — Progress Notes (Signed)
Subjective:    Patient ID: [name] [name], female    DOB: 1956-03-11, 61 y.o.   MRN: [id number]  HPI  CC:  Right Sided chest pain  61yo female with hx of bronchiectasis who developed Right lung mass discovered in 2013 and further w/u revealed mycobacterium kansasii  Infection.  Underwent RLL lobectomy.  Right sided chest pain didn't start until months after THoracotomy Dated 11/05/2012 by Dr. Roger Shelter. Last pulmonary note  05/09/2017, who referred pt for nerve block.  Pain travels from the region of her scar in the armpit area on the right side toward the underside of the right breast. Pt feels like pain is a muscle spasm, laughter or sneeze aggravates this, patient has muscle spasm during cough on the right side Arm over head relieves pain Pain Inventory Average Pain 8 Pain Right Now 8 My pain is intermittent, sharp, stabbing, aching and spasms  In the last 24 hours, has pain interfered with the following? General activity 8 Relation with others 8 Enjoyment of life 8 What TIME of day is your pain at its worst? . Sleep (in general) Fair  Pain is worse with: bending, some activites and laughing Pain improves with: positioning Relief from Meds: 0  Mobility walk without assistance ability to climb steps?  yes do you drive?  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 Larose Kells, Dr Karl Luke   Family History  Problem Relation Age of Onset  . Diabetes Mother        GM  . Heart disease Mother        in her 67s  . Heart disease Maternal Grandfather   . Cancer Father        sarcoma  . Diabetes Maternal Uncle   . Diabetes Maternal Grandmother   . Colon cancer Neg Hx   . Breast cancer Neg Hx   . Esophageal cancer Neg Hx   . Rectal cancer Neg Hx   . Stomach cancer Neg Hx    Social History   Social History  . Marital status: Single    Spouse  name: N/A  . Number of children: 0  . Years of education: N/A   Occupational History  . HR Coordinator  [employer]    Social History Main Topics  . Smoking status: Current Every Day Smoker    Packs/day: 0.40    Years: 38.00    Types: Cigarettes  . Smokeless tobacco: Never Used     Comment: ~ 1 ppd   . Alcohol use 4.2 oz/week    7 Glasses of wine per week     Comment: daily, 1 seving  . Drug use: No  . Sexual activity: Not Currently     Comment: 1st intercourse 61 yo-More than 5 partners   Other Topics Concern  . None   Social History Narrative   sister lives w/ pt    Lost mom 12-2013   Past Surgical History:  Procedure Laterality Date  . ESOPHAGOGASTRODUODENOSCOPY  12/03/2012   Procedure: ESOPHAGOGASTRODUODENOSCOPY (EGD);  Surgeon: [name], MD;  Location: [facility] ENDOSCOPY;  Service: Endoscopy;  Laterality: N/A;  . FLEXIBLE BRONCHOSCOPY  11/05/2012   Procedure: FLEXIBLE BRONCHOSCOPY;  Surgeon: [name], MD;  Location: [place];  Service: Thoracic;  Laterality: N/A;  . KNEE SURGERY     .not replacement .x8  . LOBECTOMY  11/05/2012   Procedure: LOBECTOMY;  Surgeon: [name], MD;  Location: [facility] OR;  Service: Thoracic;  Laterality: Right;  right upper lobectomy, and part of the right lower lobe  . MEDIASTINOSCOPY  05/25/2012   Procedure: MEDIASTINOSCOPY;  Surgeon: [name], MD;  Location: [facility] OR;  Service: Thoracic;  Laterality: N/A;  . ovarian cyst removed    . THORACOTOMY  11/05/2012   Procedure: THORACOTOMY MAJOR;  Surgeon: [name], MD;  Location: [facility] OR;  Service: Thoracic;  Laterality: Right;  . TONSILLECTOMY    . VIDEO BRONCHOSCOPY  02/15/2012   Procedure: VIDEO BRONCHOSCOPY WITH FLUORO;  Surgeon: [name], MD;  Location: [facility] ENDOSCOPY;  Service: Endoscopy;  Laterality: Bilateral;  WASHING- INTERVENTION BIOPSIES INTERVENTION BRONCHOSCOPY WITH VIDEO   Past Medical History:  Diagnosis Date  . Arthritis   . B12 deficiency   .  Bronchiectasis ([place])   . Cataracts, bilateral   . Colitis    induced by decongestants, NSAIds  . COPD (chronic obstructive pulmonary disease) ([place])    per CT 11/10/05, will minimal bronchiectasis rll  . Endometriosis   . GERD (gastroesophageal reflux disease)   . 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 Hemorrhoid   . Lichen planus   . 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 Osteoporosis    dexa per gyn  . Pneumothorax, spontaneous, tension    x 2 in her 20's.  has a chest tube for one  . Shoulder dislocation 2015    R shoulder , x 2   . 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   HENT: Negative.   Eyes: Negative.   Respiratory: Positive for cough and shortness of breath.   Cardiovascular: Negative.   Gastrointestinal: Negative.   Endocrine: Negative.   Genitourinary: Negative.   Musculoskeletal: Negative.   Skin: Negative.   Allergic/Immunologic: Negative.   Neurological: Negative.   Hematological: Negative.   Psychiatric/Behavioral: Negative.   All other systems reviewed and are negative.      Objective:   Physical Exam  Constitutional: She is oriented to person, place, and time. She appears well-developed and well-nourished.  HENT:  Head: Normocephalic and atraumatic.  Eyes: Pupils are equal, round, and reactive to light.  Neck: Normal range of motion. Neck supple.  Cardiovascular: Normal rate, regular rhythm and normal heart sounds.   No murmur heard. Pulmonary/Chest: Effort  normal. No respiratory distress. She has no wheezes.  Decreased breath sounds over right lung fields. Normal breath sounds over the left side  Abdominal: Soft. Bowel sounds are normal. She exhibits no distension. There is no tenderness.  Musculoskeletal:  There is tenderness over the right mid axillary line along the thoracotomy scar and 1 costal interspace superiorly. This pain extends until the anterior axillary line  With cough. There is cocontraction of not only the latissimus, but also serratus  and teres major muscles.  Neurological: She is alert and oriented to person, place, and time. Coordination and gait normal.  Reflex Scores:      Tricep reflexes are 2+ on the right side and 2+ on the left side.      Bicep reflexes are 2+ on the right side and 2+ on the left side.      Brachioradialis reflexes are 2+ on the right side and 2+ on the left side.      Patellar reflexes are 2+ on the right side and 2+ on the left side.      Achilles reflexes are 2+ on the right side and 2+ on the left side. Motor strength is 5/5 bilateral deltoid, bicep, tricep, grip, hip flexor, knee extensor, dorsiflexion  Skin: Skin is warm and dry.  Psychiatric: She has a normal mood and affect. Her behavior is normal. Thought content normal.  Nursing note and vitals reviewed.         Assessment & Plan:  1. Post thoracotomy syndrome with intercostal neuralgia. Pain is at the right mid axillary line, radiating to the anterior axillary line and 2 intercostal spaces, T7 and T8  Recommend intercostal nerve block times 2 under ultrasound guidance using Marcaine and Celestone  We discussed risks and benefits of the procedure including pneumothorax and need for chest tube.  Areas were marked, and photographic images were taken  Patient and her friend who is in attendance. Understand the risks, benefits and the patient would like to proceed. We will schedule.

## 2017-07-03 MED ORDER — VENTOLIN HFA 90 MCG/ACTUATION AEROSOL INHALER
6 refills | 0 days | Status: CP
Start: 2017-07-03 — End: ?

## 2017-07-03 MED FILL — QVAR REDIHALER 80 MCG/ACT A: 80 | 30 days supply | Qty: 11 | Fill #1

## 2017-07-03 MED FILL — VENTOLIN HFA 90 MCG INHALER: 108 (90 BAS | 16 days supply | Qty: 18 | Fill #0

## 2017-07-12 MED FILL — HYDROCODON-APAP 7.5-325: 7.5-325 | 22 days supply | Qty: 90 | Fill #0

## 2017-07-17 ENCOUNTER — Telehealth: Payer: Self-pay | Admitting: Internal Medicine

## 2017-07-17 NOTE — Telephone Encounter (Signed)
I notified patient and she scheduled an appointment with Anda Kraft on 07/27/17.

## 2017-07-17 NOTE — Telephone Encounter (Signed)
That is okay, thx 

## 2017-07-17 NOTE — Telephone Encounter (Signed)
Fine with me, thanks. 

## 2017-07-17 NOTE — Telephone Encounter (Signed)
Patient is requesting to switch from Dr.Paz to Allie Bossier.  Patient said she lives in [place] and she'd be much closer to the [facility] office. Patient was very sick a couple weeks ago and said she couldn't have driven to Dr.Paz' office.  Can patient switch?  Pt said a detailed message can be left on her voice mail.

## 2017-07-21 ENCOUNTER — Encounter: Payer: 59 | Attending: Physical Medicine & Rehabilitation

## 2017-07-21 ENCOUNTER — Ambulatory Visit (HOSPITAL_BASED_OUTPATIENT_CLINIC_OR_DEPARTMENT_OTHER): Payer: 59 | Admitting: Physical Medicine & Rehabilitation

## 2017-07-21 ENCOUNTER — Encounter: Payer: Self-pay | Admitting: Physical Medicine & Rehabilitation

## 2017-07-21 DIAGNOSIS — F17210 Nicotine dependence, cigarettes, uncomplicated: Secondary | ICD-10-CM | POA: Insufficient documentation

## 2017-07-21 DIAGNOSIS — K219 Gastro-esophageal reflux disease without esophagitis: Secondary | ICD-10-CM | POA: Diagnosis not present

## 2017-07-21 DIAGNOSIS — G588 Other specified mononeuropathies: Secondary | ICD-10-CM | POA: Insufficient documentation

## 2017-07-21 DIAGNOSIS — J449 Chronic obstructive pulmonary disease, unspecified: Secondary | ICD-10-CM | POA: Insufficient documentation

## 2017-07-21 DIAGNOSIS — G8912 Acute post-thoracotomy pain: Secondary | ICD-10-CM | POA: Diagnosis not present

## 2017-07-21 NOTE — Patient Instructions (Signed)
He had intercostal nerve block. 4. Intercostal neuralgia, a combination of lidocaine and Celestone were injected. We may repeat this again in a month if needed. If it was a very short-term relief, we may try a slightly different combination of medications. We also discussed other procedures including freezing, the nerve and heating the nerve .

## 2017-07-21 NOTE — Procedures (Signed)
Informed consent was obtained at describing risks and benefits of procedure, including pneumothorax Patient placed in the left lateral decubitus position with the arm overhead. Linear ultrasound transducer identified symptomatic areas by sonopalpation at the mid axillary line at T7 and T6. Areas were marked, prepped with Betadine and then entered with 25-gauge 1.5 inch needle. Using 1% lidocaine to anesthetize the needle track. Direct ultrasound visualization was utilized. Needle entry point was above the rib inferior to the symptomatic nerve. Fluoroscopy was identified. Needle tip was Superior to the pleura and inferior to the corresponding rib. Once needle tip reached the inferior surface of the corresponding rib, 2.5 mL's of a solution containing 1 mL of Celestone 6 mL per cc and 4 mL's of 1% lidocaine was injected.  This was first performed at T7 and at T6. Patient tolerated procedure well

## 2017-07-27 ENCOUNTER — Ambulatory Visit (INDEPENDENT_AMBULATORY_CARE_PROVIDER_SITE_OTHER): Payer: 59 | Admitting: Primary Care

## 2017-07-27 ENCOUNTER — Encounter: Payer: Self-pay | Admitting: Primary Care

## 2017-07-27 VITALS — BP 122/76 | HR 68 | Temp 98.6°F | Ht 67.0 in | Wt 114.1 lb

## 2017-07-27 DIAGNOSIS — J471 Bronchiectasis with (acute) exacerbation: Secondary | ICD-10-CM

## 2017-07-27 DIAGNOSIS — F17200 Nicotine dependence, unspecified, uncomplicated: Secondary | ICD-10-CM | POA: Diagnosis not present

## 2017-07-27 DIAGNOSIS — K559 Vascular disorder of intestine, unspecified: Secondary | ICD-10-CM

## 2017-07-27 DIAGNOSIS — K219 Gastro-esophageal reflux disease without esophagitis: Secondary | ICD-10-CM

## 2017-07-27 DIAGNOSIS — G8929 Other chronic pain: Secondary | ICD-10-CM

## 2017-07-27 DIAGNOSIS — M810 Age-related osteoporosis without current pathological fracture: Secondary | ICD-10-CM

## 2017-07-27 DIAGNOSIS — G4700 Insomnia, unspecified: Secondary | ICD-10-CM | POA: Diagnosis not present

## 2017-07-27 DIAGNOSIS — G588 Other specified mononeuropathies: Secondary | ICD-10-CM

## 2017-07-27 DIAGNOSIS — J449 Chronic obstructive pulmonary disease, unspecified: Secondary | ICD-10-CM

## 2017-07-27 MED ORDER — ALPRAZOLAM 0.25 MG PO TABS: 0.2500 mg | ORAL_TABLET | Freq: Every evening | ORAL | 0 refills | Status: DC | PRN

## 2017-07-27 MED ORDER — PANTOPRAZOLE SODIUM 40 MG PO TBEC: 40.0000 mg | DELAYED_RELEASE_TABLET | Freq: Two times a day (BID) | ORAL | 3 refills | Status: AC

## 2017-07-27 MED FILL — ALPRAZolam 0.25 MG TABS: 0.25 | 30 days supply | Qty: 30 | Fill #0

## 2017-07-27 MED FILL — PANTOPRAZOLE SOD DR 40 MG T: 40 | 90 days supply | Qty: 180 | Fill #0

## 2017-07-27 NOTE — Assessment & Plan Note (Signed)
Following with pulmonology through [facility]. History of thoracotomy in 2013. She will continue to follow with pulmonology in regards to chronic lung disease. Continue albuterol and QVAR.

## 2017-07-27 NOTE — Assessment & Plan Note (Signed)
Continues to smoke despite recommendations for tobacco cessation.

## 2017-07-27 NOTE — Progress Notes (Signed)
Subjective:    Patient ID: [name] [name], female    DOB: 09/12/1956, 61 y.o.   MRN: [id number]  HPI  [name] [name] is a 61 year old female who presents today to transfer care from [facility]. Her last physical was in May 2018.  1) COPD: Currently managed on QVAR and albuterol. Following with Pulmonology through [facility] for which she sees semi-annually. She continues to smoke. Significant history of lung disease.   2) GERD: Currently managed on pantoprazole 40 mg BID, she only takes this once daily most of the time. She attempted to come off of pantoprazole but experienced nausea, abdominal pain, esophageal burning. She is needing refills today.  3) Insomnia: Currently managed on alprazolam 0.5 mg tablets for which she takes 1/2 tablet at bedtime. She's tried taking Melatonin in the past without improvement, has never tried anything else. She has an appointment with a psychiatrist in late September 2018.   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 She's been on narcotic pain medications for the 20 years. Recently started following with pain management for right thoracic muscle spasms. Her prior PCP has been prescribing her Norco for the past 10+ years. She does have a prescription for methocarbamol for which she takes once monthly on average for severe spasms.   Currently managed on hydrocodone/apap 7.5/325 mg for which she's taken since January 2018, prior to that was taking 10/325 mg for years. She takes this medication every 6 hours, everyday. Since her medication dose reduction she's "taking a lot of tylenol".   5) Osteoporosis: Diagnosed years ago. Not currently taking anything except for eating "lots of sour cream". She cannot tolerate bisphosphonate medications as they cause chest pain and shortness of breath.   6) Chronic Lung Disease: History of  mycobacterium kansasii, bronchiectasis, COPD, lung masses. Currently following with Pulmonology at [facility] and Thoracic Surgery. Her pulmonologist sent her to pain management for injections for muscle spasms, diagnosed with intercostal neuralgia. She received her first never block Friday last week which was helpful for 3 days. Her next injection is due in 2 weeks. Pain management is not currently prescribing Norco.   She does experience exertional shortness of breath. She has a portable pulse oximetry meter and has noticed several times of decreased oxygen saturation with exertion. Saturations drop from 89-54%, she has notified her pulmonologist.    Review of Systems  Respiratory: Positive for shortness of breath. Negative for wheezing.   Cardiovascular: Negative for chest pain.  Gastrointestinal:       Gerd  Musculoskeletal: Positive for arthralgias and myalgias.  Skin: Negative for color change.       Past Medical History:  Diagnosis Date  . Arthritis   . B12 deficiency   . Bronchiectasis ([place])   . Cataracts, bilateral   . Colitis    induced by decongestants, NSAIds  . COPD (chronic obstructive pulmonary disease) ([place])    per CT 11/10/05, will minimal bronchiectasis rll  . Endometriosis   . GERD (gastroesophageal reflux disease)   . 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 Hemorrhoid   . Lichen planus   . 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 Osteoporosis    dexa per gyn  . Pneumothorax, spontaneous, tension    x 2 in her 20's.  has a chest tube for one  .  Shoulder dislocation 2015    R shoulder , x 2   . 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 Marital status: Single    Spouse name: N/A  . Number of children: 0  . Years of education: N/A   Occupational History  . 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 Smoking status:  Current Every Day Smoker    Packs/day: 0.40    Years: 38.00    Types: Cigarettes  . Smokeless tobacco: Never Used     Comment: ~ 1 ppd   . Alcohol use 4.2 oz/week    7 Glasses of wine per week     Comment: daily, 1 seving  . Drug use: No  . Sexual activity: Not Currently     Comment: 1st intercourse 61 yo-More than 5 partners   Other Topics Concern  . Not on file   Social History Narrative   sister lives w/ pt    Lost mom 12-2013    Past Surgical History:  Procedure Laterality Date  . ESOPHAGOGASTRODUODENOSCOPY  12/03/2012   Procedure: ESOPHAGOGASTRODUODENOSCOPY (EGD);  Surgeon: [name], MD;  Location[facility] ENDOSCOPY;  Service: Endoscopy;  Laterality: N/A;  . FLEXIBLE BRONCHOSCOPY  11/05/2012   Procedure: FLEXIBLE BRONCHOSCOPY;  Surgeon: [name], MD;  Location: [place];  Service: Thoracic;  Laterality: N/A;  . KNEE SURGERY     .not replacement .x8  . LOBECTOMY  11/05/2012   Procedure: LOBECTOMY;  Surgeon: [name], MD;  Location: [facility] OR;  Service: Thoracic;  Laterality: Right;  right upper lobectomy, and part of the right lower lobe  . MEDIASTINOSCOPY  05/25/2012   Procedure: MEDIASTINOSCOPY;  Surgeon: [name], MD;  Location: [facility] OR;  Service: Thoracic;  Laterality: N/A;  . ovarian cyst removed    . THORACOTOMY  11/05/2012   Procedure: THORACOTOMY MAJOR;  Surgeon: [name], MD;  Location: [facility] OR;  Service: Thoracic;  Laterality: Right;  . TONSILLECTOMY    . VIDEO BRONCHOSCOPY  02/15/2012   Procedure: VIDEO BRONCHOSCOPY WITH FLUORO;  Surgeon: [name], MD;  Location: [facility] ENDOSCOPY;  Service: Endoscopy;  Laterality: Bilateral;  WASHING- INTERVENTION BIOPSIES INTERVENTION BRONCHOSCOPY WITH VIDEO    Family History  Problem Relation Age of Onset  . Diabetes Mother        GM  . Heart disease Mother        in her 35s  . Heart disease Maternal Grandfather   . Cancer Father        sarcoma  . Diabetes Maternal Uncle   . Diabetes Maternal Grandmother     . Colon cancer Neg Hx   . Breast cancer Neg Hx   . Esophageal cancer Neg Hx   . Rectal cancer Neg Hx   . Stomach cancer Neg Hx     Allergies  Allergen Reactions  . Alendronate Sodium Other (See Comments)    chest pain  . Antihistamines, Chlorpheniramine-Type Other (See Comments)    "makes lungs bleed"  . Benadryl [Diphenhydramine] Other (See Comments)    "makes lungs bleed" (patient states she can tolerate the newer ones, like Claritin)  . Nsaids Other (See Comments)    ischemic colitis  . Other Other (See Comments)    (All anti-inflammatories) ischemic colitis  . Pheniramine Other (See Comments)    "makes lungs bleed"  . Pseudoephedrine  Other (See Comments)    Ischemic colitis  . Levofloxacin Anxiety  . Robitussin A-C [Guaifenesin-Codeine] Itching and Other (See Comments)    Palms itching    Current Outpatient Prescriptions on File Prior to Visit  Medication Sig Dispense Refill  . acetaminophen (TYLENOL) 500 MG tablet Take 500 mg by mouth every 4 (four) hours as needed for moderate pain or fever. For fever    . albuterol (PROVENTIL HFA) 108 (90 Base) MCG/ACT inhaler Inhale 2 puffs into the lungs every 4 (four) hours as needed for wheezing or shortness of breath.    Marland Kitchen aspirin 81 MG chewable tablet Chew 81 mg by mouth daily.    . benzonatate (TESSALON) 100 MG capsule Take 1-2 capsules (100-200 mg total) by mouth every 8 (eight) hours as needed for cough. 30 capsule 0  . Cholecalciferol (VITAMIN D PO) Take 1 tablet by mouth daily.    Marland Kitchen HYDROcodone-acetaminophen (NORCO) 7.5-325 MG tablet Take 1 tablet by mouth every 6 (six) hours as needed for moderate pain. 90 tablet 0  . LYSINE PO Take 1 tablet by mouth daily.    . Magnesium 400 MG TABS Take 1 tablet by mouth daily.    . methocarbamol (ROBAXIN) 500 MG tablet Take 1 tablet (500 mg total) by mouth every 8 (eight) hours as needed for muscle spasms. (Patient taking differently: Take 250 mg by mouth every 8 (eight) hours as needed  for muscle spasms. ) 90 tablet 1  . nicotine polacrilex (NICORETTE) 4 MG lozenge Place 4 mg inside cheek every 3 (three) hours as needed for smoking cessation. For nicotine cravings.    . Potassium Gluconate 595 MG CAPS Take 1 capsule by mouth daily.    Marland Kitchen QVAR REDIHALER 80 MCG/ACT AERB Inhale 2 puffs into the lungs 2 (two) times daily.  11   No current facility-administered medications on file prior to visit.     BP 122/76   Pulse 68   Temp 98.6 F (37 C) (Oral)   Ht _0  (1.702 m)   Wt 114 lb 1.9 oz (51.8 kg)   LMP 11/05/2004   SpO2 95%   BMI 17.87 kg/m    Objective:   Physical Exam  Constitutional: She appears well-nourished.  Neck: Neck supple.  Cardiovascular: Normal rate and regular rhythm.   Pulmonary/Chest: Effort normal and breath sounds normal.  Skin: Skin is warm and dry.  Psychiatric: She has a normal mood and affect.          Assessment & Plan:

## 2017-07-27 NOTE — Assessment & Plan Note (Signed)
Chronic pain to knees, hips, lungs. Extensive lung disease history. Managed on Norco for the past 20+ years, mostly prescribed by prior PCP.   Discussed that we will take over her prescription for hydrocodone/apap 7.5/325 mg if:  1. Is weaned off of alprazolam due to black box warning of respiratory depression, coma, death. She verbalized understanding and will work with psychiatry to wean off. Appointment scheduled in September 2018. 2. She will seek further treatment from pain management if she requires an increase in dose.  3. She completes a urine drug screen and controlled substance contract. She completed this today prior to leaving. 4. Shows no suspicous activity on the [place] controlled substance website. Reviewed and verified today. 5. Follow up appointments every 3 months.  She agrees to all mentioned above. She will call when needing refills.

## 2017-07-27 NOTE — Assessment & Plan Note (Signed)
Chronic pain, managed on Norco. Avoids NSAID's.

## 2017-07-27 NOTE — Assessment & Plan Note (Signed)
Discussed the black box warning in regards to use of alprazolam and hydrocodone. She will be working with psychiatry in September 2018 to wean off. Refill provided today with lower dose to start weaning process.

## 2017-07-27 NOTE — Assessment & Plan Note (Signed)
Following with pain management for injections. Recommend she discuss oral pain management if ever needing a dose increase, she verbalized understanding.

## 2017-07-27 NOTE — Assessment & Plan Note (Signed)
Stable on pantoprazole 40 mg, takes once daily mostly. Discussed the long term effects of PPI's including hazards to bone health, she verbalized understanding and cannot come off of PPI given immediate return of symptoms.

## 2017-07-27 NOTE — Assessment & Plan Note (Signed)
Declines all treatment for osteoporosis, managed on PPI, and continues to smoke. Discussed risks of fracture based off of her history, smoking habits, and PPI use.

## 2017-07-27 NOTE — Patient Instructions (Signed)
I sent refills for pantoprazole and alprazolam to your pharmacy.   Follow up with psychiatry as scheduled.   Follow up in 3 months for re-evaluation of chronic pain.  It was a pleasure to meet you today! Please don't hesitate to call me with any questions. Welcome to [facility]!

## 2017-08-04 ENCOUNTER — Telehealth: Payer: Self-pay | Admitting: Primary Care

## 2017-08-04 NOTE — Telephone Encounter (Signed)
Please see note/thx dmf

## 2017-08-04 NOTE — Telephone Encounter (Signed)
Pt sent a Mychart message to make provider aware that she had a new provider that closer to her home.

## 2017-08-07 ENCOUNTER — Encounter: Payer: Self-pay | Admitting: Primary Care

## 2017-08-08 ENCOUNTER — Encounter: Payer: Self-pay | Admitting: Primary Care

## 2017-08-08 ENCOUNTER — Ambulatory Visit (INDEPENDENT_AMBULATORY_CARE_PROVIDER_SITE_OTHER): Payer: 59 | Admitting: Primary Care

## 2017-08-08 ENCOUNTER — Ambulatory Visit (INDEPENDENT_AMBULATORY_CARE_PROVIDER_SITE_OTHER)
Admission: RE | Admit: 2017-08-08 | Discharge: 2017-08-08 | Disposition: A | Discharge status: Home / Self Care | Payer: 59 | Source: Ambulatory Visit | Attending: Primary Care | Admitting: Primary Care

## 2017-08-08 VITALS — BP 118/68 | HR 82 | Temp 98.5°F | Ht 67.0 in | Wt 111.1 lb

## 2017-08-08 DIAGNOSIS — R05 Cough: Secondary | ICD-10-CM

## 2017-08-08 DIAGNOSIS — J069 Acute upper respiratory infection, unspecified: Secondary | ICD-10-CM | POA: Diagnosis not present

## 2017-08-08 DIAGNOSIS — M159 Polyosteoarthritis, unspecified: Secondary | ICD-10-CM

## 2017-08-08 DIAGNOSIS — G8912 Acute post-thoracotomy pain: Secondary | ICD-10-CM

## 2017-08-08 DIAGNOSIS — R059 Cough, unspecified: Secondary | ICD-10-CM

## 2017-08-08 MED ORDER — HYDROCODONE-ACETAMINOPHEN 7.5-325 MG PO TABS: 1.0000 | ORAL_TABLET | Freq: Four times a day (QID) | ORAL | 0 refills | Status: DC | PRN

## 2017-08-08 NOTE — Telephone Encounter (Signed)
FYI- Pt changed to new provider at [facility] location-which is closer to home.

## 2017-08-08 NOTE — Telephone Encounter (Signed)
thx

## 2017-08-08 NOTE — Progress Notes (Signed)
Subjective:    Patient ID: [name] [name], female    DOB: August 17, 1956, 61 y.o.   MRN: [id number]  HPI  [name] is a 61 year old female with a history of COPD, bronchiectasis, tobacco abuse, intercostal neuralgia who presents today with a chief complaint of fever. She also reports cough, body aches, chest congestion. Her fevers are running 100-102.7. Her symptoms began three days ago. Yesterday she felt much worse with increased body aches, cough, and fatigue. She's been taking tylenol with temporary reduction in fevers. Her fever this morning was 100.7, took tylenol this morning, rechecked her temperature this afternoon which was normal. She's not had tylenol since. Overall she's feeling better today. Her cough has been productive with yellow/green sputum.   Review of Systems  Constitutional: Positive for fatigue and fever.  Respiratory: Positive for cough and shortness of breath. Negative for wheezing.   Cardiovascular: Negative for chest pain.  Musculoskeletal: Positive for myalgias.       Past Medical History:  Diagnosis Date  . Arthritis   . B12 deficiency   . Bronchiectasis ([place])   . Cataracts, bilateral   . Colitis    induced by decongestants, NSAIds  . COPD (chronic obstructive pulmonary disease) ([place])    per CT 11/10/05, will minimal bronchiectasis rll  . Endometriosis   . GERD (gastroesophageal reflux disease)   . 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 Hemorrhoid   . Lichen planus   . 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 Osteoporosis    dexa per gyn  . Pneumothorax, spontaneous, tension    x 2 in her 20's.  has a chest tube for one  . Shoulder dislocation 2015    R shoulder , x 2   . 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 Marital status: Single    Spouse name: N/A  . Number of children: 0  . Years of education: N/A   Occupational  History  . 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 Smoking status: Current Every Day Smoker    Packs/day: 0.40    Years: 38.00    Types: Cigarettes  . Smokeless tobacco: Never Used     Comment: ~ 1 ppd   . Alcohol use 4.2 oz/week    7 Glasses of wine per week     Comment: daily, 1 seving  . Drug use: No  . Sexual activity: Not Currently     Comment: 1st intercourse 61 yo-More than 5 partners   Other Topics Concern  . Not on file   Social History Narrative   sister lives w/ pt    Lost mom 12-2013    Past Surgical History:  Procedure Laterality Date  . ESOPHAGOGASTRODUODENOSCOPY  12/03/2012   Procedure: ESOPHAGOGASTRODUODENOSCOPY (EGD);  Surgeon: [name], MD;  Location: [facility] ENDOSCOPY;  Service: Endoscopy;  Laterality: N/A;  . FLEXIBLE BRONCHOSCOPY  11/05/2012   Procedure: FLEXIBLE BRONCHOSCOPY;  Surgeon: [name], MD;  Location: [place];  Service: Thoracic;  Laterality: N/A;  . KNEE SURGERY     .not replacement .x8  . LOBECTOMY  11/05/2012   Procedure: LOBECTOMY;  Surgeon: [name], MD;  Location: [place] OR;  Service: Thoracic;  Laterality: Right;  right upper lobectomy, and part of the right lower  lobe  . MEDIASTINOSCOPY  05/25/2012   Procedure: MEDIASTINOSCOPY;  Surgeon: [name], MD;  Location: [facility] OR;  Service: Thoracic;  Laterality: N/A;  . ovarian cyst removed    . THORACOTOMY  11/05/2012   Procedure: THORACOTOMY MAJOR;  Surgeon: [name], MD;  Location[facility];  Service: Thoracic;  Laterality: Right;  . TONSILLECTOMY    . VIDEO BRONCHOSCOPY  02/15/2012   Procedure: VIDEO BRONCHOSCOPY WITH FLUORO;  Surgeon: [name], MD;  Location: [facility] ENDOSCOPY;  Service: Endoscopy;  Laterality: Bilateral;  WASHING- INTERVENTION BIOPSIES INTERVENTION BRONCHOSCOPY WITH VIDEO    Family History  Problem Relation Age of Onset  . Diabetes Mother        GM  . Heart disease Mother        in her 52s  . Heart disease Maternal Grandfather    . Cancer Father        sarcoma  . Diabetes Maternal Uncle   . Diabetes Maternal Grandmother   . Colon cancer Neg Hx   . Breast cancer Neg Hx   . Esophageal cancer Neg Hx   . Rectal cancer Neg Hx   . Stomach cancer Neg Hx     Allergies  Allergen Reactions  . Alendronate Sodium Other (See Comments)    chest pain  . Antihistamines, Chlorpheniramine-Type Other (See Comments)    "makes lungs bleed"  . Benadryl [Diphenhydramine] Other (See Comments)    "makes lungs bleed" (patient states she can tolerate the newer ones, like Claritin)  . Nsaids Other (See Comments)    ischemic colitis  . Other Other (See Comments)    (All anti-inflammatories) ischemic colitis  . Pheniramine Other (See Comments)    "makes lungs bleed"  . Pseudoephedrine Other (See Comments)    Ischemic colitis  . Levofloxacin Anxiety  . Robitussin A-C [Guaifenesin-Codeine] Itching and Other (See Comments)    Palms itching    Current Outpatient Prescriptions on File Prior to Visit  Medication Sig Dispense Refill  . acetaminophen (TYLENOL) 500 MG tablet Take 500 mg by mouth every 4 (four) hours as needed for moderate pain or fever. For fever    . albuterol (PROVENTIL HFA) 108 (90 Base) MCG/ACT inhaler Inhale 2 puffs into the lungs every 4 (four) hours as needed for wheezing or shortness of breath.    . ALPRAZolam (XANAX) 0.25 MG tablet Take 1 tablet (0.25 mg total) by mouth at bedtime as needed for anxiety or sleep. 30 tablet 0  . aspirin 81 MG chewable tablet Chew 81 mg by mouth daily.    . benzonatate (TESSALON) 100 MG capsule Take 1-2 capsules (100-200 mg total) by mouth every 8 (eight) hours as needed for cough. 30 capsule 0  . Cholecalciferol (VITAMIN D PO) Take 1 tablet by mouth daily.    Marland Kitchen LYSINE PO Take 1 tablet by mouth daily.    . Magnesium 400 MG TABS Take 1 tablet by mouth daily.    . methocarbamol (ROBAXIN) 500 MG tablet Take 1 tablet (500 mg total) by mouth every 8 (eight) hours as needed for muscle  spasms. (Patient taking differently: Take 250 mg by mouth every 8 (eight) hours as needed for muscle spasms. ) 90 tablet 1  . nicotine polacrilex (NICORETTE) 4 MG lozenge Place 4 mg inside cheek every 3 (three) hours as needed for smoking cessation. For nicotine cravings.    . pantoprazole (PROTONIX) 40 MG tablet Take 1 tablet (40 mg total) by mouth 2 (two) times daily before  a meal. 180 tablet 3  . Potassium Gluconate 595 MG CAPS Take 1 capsule by mouth daily.    Marland Kitchen QVAR REDIHALER 80 MCG/ACT AERB Inhale 2 puffs into the lungs 2 (two) times daily.  11   No current facility-administered medications on file prior to visit.     BP 118/68   Pulse 82   Temp 98.5 F (36.9 C) (Oral)   Ht _0  (1.702 m)   Wt 111 lb 1.9 oz (50.4 kg)   LMP 11/05/2004   SpO2 97%   BMI 17.40 kg/m    Objective:   Physical Exam  Constitutional: She appears well-nourished.  Appears tired  HENT:  Right Ear: Tympanic membrane and ear canal normal.  Left Ear: Tympanic membrane and ear canal normal.  Nose: Right sinus exhibits no maxillary sinus tenderness and no frontal sinus tenderness. Left sinus exhibits no maxillary sinus tenderness and no frontal sinus tenderness.  Mouth/Throat: Oropharynx is clear and moist.  Eyes: Conjunctivae are normal.  Neck: Neck supple.  Cardiovascular: Normal rate and regular rhythm.   Pulmonary/Chest: Effort normal and breath sounds normal. She has no wheezes. She has no rales.  Lymphadenopathy:    She has no cervical adenopathy.  Skin: Skin is warm and dry.          Assessment & Plan:  URI:  Cough, fevers, body aches x 3 days. Overall feeling better today.  No recent fevers or tylenol use. Exam today with clear lungs, no obvious rales or rhonchi. Given high fevers, will evaluate with chest xray.  If fevers persist in 48 hours, will treat with Augmentin course. She will update. For now, will have her take Tessalon Perles, hydrate, and rest. Will await for xray  result.  Sheral Flow, NP

## 2017-08-08 NOTE — Patient Instructions (Signed)
Complete xray(s) prior to leaving today. I will notify you of your results once received.  You may take Tessalon Perles every 8 hours as needed for cough.  Please call me Thursday if no improvement in symptoms.  It was a pleasure to see you today!

## 2017-08-09 ENCOUNTER — Encounter: Payer: Self-pay | Admitting: Primary Care

## 2017-08-09 ENCOUNTER — Telehealth: Payer: Self-pay

## 2017-08-09 ENCOUNTER — Other Ambulatory Visit: Payer: Self-pay | Admitting: Primary Care

## 2017-08-09 ENCOUNTER — Ambulatory Visit
Admission: RE | Admit: 2017-08-09 | Discharge: 2017-08-09 | Disposition: A | Discharge status: Home / Self Care | Payer: 59 | Source: Ambulatory Visit | Attending: Primary Care | Admitting: Primary Care

## 2017-08-09 DIAGNOSIS — J069 Acute upper respiratory infection, unspecified: Secondary | ICD-10-CM

## 2017-08-09 DIAGNOSIS — R918 Other nonspecific abnormal finding of lung field: Secondary | ICD-10-CM

## 2017-08-09 DIAGNOSIS — R0602 Shortness of breath: Secondary | ICD-10-CM | POA: Diagnosis not present

## 2017-08-09 MED ORDER — IOPAMIDOL (ISOVUE-300) INJECTION 61%
75.0000 mL | Freq: Once | INTRAVENOUS | Status: AC | PRN
  Administered 2017-08-09: 75 mL via INTRAVENOUS

## 2017-08-09 MED ORDER — AMOXICILLIN-POT CLAVULANATE 875-125 MG PO TABS: 1.0000 | ORAL_TABLET | Freq: Two times a day (BID) | ORAL | 0 refills | Status: DC

## 2017-08-09 MED FILL — HYDROCODON-APAP 7.5-325: 7.5-325 | 22 days supply | Qty: 90 | Fill #0

## 2017-08-09 MED FILL — AMOX TR-K CLV 875-125 MG TA: 875-125 | 10 days supply | Qty: 20 | Fill #0

## 2017-08-09 NOTE — Telephone Encounter (Signed)
[name] [name] with [facility] called CXR report; report in Epic; copy taken to Gentry Fitz NP area.

## 2017-08-09 NOTE — Telephone Encounter (Signed)
Noted, treated for probable infection. CT chest ordered and pending.

## 2017-08-10 ENCOUNTER — Telehealth: Payer: Self-pay

## 2017-08-10 NOTE — Telephone Encounter (Signed)
Notified Ms Tolliver.

## 2017-08-10 NOTE — Telephone Encounter (Signed)
She may still get the injection as long as she is not coughing a lot. Otherwise, she will need to wait until the coughing subside so we can do the injection

## 2017-08-10 NOTE — Telephone Encounter (Signed)
Patient called today, states was recently diagnosed with pneumouria and will be on antibiotics up until the day of her intercostal nerve block, wants to know if she can still receive the injection after recent diagnosses

## 2017-08-11 ENCOUNTER — Ambulatory Visit
Admit: 2017-08-11 | Discharge: 2017-08-11 | Disposition: A | Discharge status: Home / Self Care | Payer: Commercial Managed Care - PPO

## 2017-08-11 DIAGNOSIS — J479 Bronchiectasis, uncomplicated: Principal | ICD-10-CM

## 2017-08-11 NOTE — Telephone Encounter (Signed)
Nurse from [facility] called back in regards to CT of chest. Will notify Dr. Olena Heckle about result when report is completed on there end and will notify Allie Bossier, NP about their findings.

## 2017-08-15 ENCOUNTER — Telehealth: Payer: Self-pay | Admitting: Primary Care

## 2017-08-15 NOTE — Telephone Encounter (Signed)
Noted. Completed a ton Robin's desk.

## 2017-08-15 NOTE — Telephone Encounter (Signed)
[facility] paperwork in [facility] in box  °For review and signature °

## 2017-08-16 NOTE — Telephone Encounter (Signed)
Paperwork faxed °

## 2017-08-16 NOTE — Telephone Encounter (Signed)
Copy for file  Copy for scan Copy for pt Left message asking pt to call office

## 2017-08-17 ENCOUNTER — Ambulatory Visit (INDEPENDENT_AMBULATORY_CARE_PROVIDER_SITE_OTHER): Payer: 59 | Admitting: Primary Care

## 2017-08-17 VITALS — BP 106/64 | HR 80 | Temp 98.3°F | Ht 67.0 in | Wt 115.1 lb

## 2017-08-17 DIAGNOSIS — J181 Lobar pneumonia, unspecified organism: Secondary | ICD-10-CM

## 2017-08-17 DIAGNOSIS — J189 Pneumonia, unspecified organism: Secondary | ICD-10-CM

## 2017-08-17 NOTE — Progress Notes (Signed)
Subjective:    Patient ID: [name] [name], female    DOB: 10-08-1956, 61 y.o.   MRN: [id number]  HPI  [name] [name] is a 61 year old female with a history of pneumonia, bronchiectasis, COPD who presents today for follow up.  Diagnosed with multifocal pneumonia to right lower lobe on 08/09/17 and treated with a 10 day course of Augmentin. She has contacted her pulmonologist who is looking at her recent CT. Since treatment her fevers are running low 97's. She's felt as though she's gradually improved, but yesterday she started feeling worse, coughing up thick "foul tasting" mucous. This morning she's feeling better, hardly any cough this morning. She denies nausea/vomiting. Still feeling tired.   Review of Systems  Constitutional: Positive for fatigue. Negative for fever.  HENT: Negative for congestion and sore throat.   Respiratory:       Improved, mild cough  Cardiovascular: Negative for chest pain.       Past Medical History:  Diagnosis Date  . Arthritis   . B12 deficiency   . Bronchiectasis ([place])   . Cataracts, bilateral   . Colitis    induced by decongestants, NSAIds  . COPD (chronic obstructive pulmonary disease) ([place])    per CT 11/10/05, will minimal bronchiectasis rll  . Endometriosis   . GERD (gastroesophageal reflux disease)   . 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 Hemorrhoid   . Lichen planus   . 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 Osteoporosis    dexa per gyn  . Pneumothorax, spontaneous, tension    x 2 in her 20's.  has a chest tube for one  . Shoulder dislocation 2015    R shoulder , x 2   . 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 Marital status: Single    Spouse name: N/A  . Number of children: 0  . Years of education: N/A   Occupational History  . 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 Smoking  status: Current Every Day Smoker    Packs/day: 0.40    Years: 38.00    Types: Cigarettes  . Smokeless tobacco: Never Used     Comment: ~ 1 ppd   . Alcohol use 4.2 oz/week    7 Glasses of wine per week     Comment: daily, 1 seving  . Drug use: No  . Sexual activity: Not Currently     Comment: 1st intercourse 61 yo-More than 5 partners   Other Topics Concern  . Not on file   Social History Narrative   sister lives w/ pt    Lost mom 12-2013    Past Surgical History:  Procedure Laterality Date  . ESOPHAGOGASTRODUODENOSCOPY  12/03/2012   Procedure: ESOPHAGOGASTRODUODENOSCOPY (EGD);  Surgeon: [name], MD;  Location: [facility] ENDOSCOPY;  Service: Endoscopy;  Laterality: N/A;  . FLEXIBLE BRONCHOSCOPY  11/05/2012   Procedure: FLEXIBLE BRONCHOSCOPY;  Surgeon: [name], MD;  Location: [place];  Service: Thoracic;  Laterality: N/A;  . KNEE SURGERY     .not replacement .x8  . LOBECTOMY  11/05/2012   Procedure: LOBECTOMY;  Surgeon: [name], MD;  Location: [facility] OR;  Service: Thoracic;  Laterality: Right;  right upper lobectomy, and part of the right lower lobe  . MEDIASTINOSCOPY  05/25/2012   Procedure: MEDIASTINOSCOPY;  Surgeon: [name], MD;  Location: [facility] OR;  Service: Thoracic;  Laterality: N/A;  . ovarian cyst removed    . THORACOTOMY  11/05/2012   Procedure: THORACOTOMY MAJOR;  Surgeon: [name], MD;  Location: [facility] OR;  Service: Thoracic;  Laterality: Right;  . TONSILLECTOMY    . VIDEO BRONCHOSCOPY  02/15/2012   Procedure: VIDEO BRONCHOSCOPY WITH FLUORO;  Surgeon: [name], MD;  Location: [facility] ENDOSCOPY;  Service: Endoscopy;  Laterality: Bilateral;  WASHING- INTERVENTION BIOPSIES INTERVENTION BRONCHOSCOPY WITH VIDEO    Family History  Problem Relation Age of Onset  . Diabetes Mother        GM  . Heart disease Mother        in her 32s  . Heart disease Maternal Grandfather   . Cancer Father        sarcoma  . Diabetes Maternal Uncle   . Diabetes Maternal  Grandmother   . Colon cancer Neg Hx   . Breast cancer Neg Hx   . Esophageal cancer Neg Hx   . Rectal cancer Neg Hx   . Stomach cancer Neg Hx     Allergies  Allergen Reactions  . Alendronate Sodium Other (See Comments)    chest pain  . Antihistamines, Chlorpheniramine-Type Other (See Comments)    "makes lungs bleed"  . Benadryl [Diphenhydramine] Other (See Comments)    "makes lungs bleed" (patient states she can tolerate the newer ones, like Claritin)  . Nsaids Other (See Comments)    ischemic colitis  . Other Other (See Comments)    (All anti-inflammatories) ischemic colitis  . Pheniramine Other (See Comments)    "makes lungs bleed"  . Pseudoephedrine Other (See Comments)    Ischemic colitis  . Levofloxacin Anxiety  . Robitussin A-C [Guaifenesin-Codeine] Itching and Other (See Comments)    Palms itching    Current Outpatient Prescriptions on File Prior to Visit  Medication Sig Dispense Refill  . acetaminophen (TYLENOL) 500 MG tablet Take 500 mg by mouth every 4 (four) hours as needed for moderate pain or fever. For fever    . albuterol (PROVENTIL HFA) 108 (90 Base) MCG/ACT inhaler Inhale 2 puffs into the lungs every 4 (four) hours as needed for wheezing or shortness of breath.    . ALPRAZolam (XANAX) 0.25 MG tablet Take 1 tablet (0.25 mg total) by mouth at bedtime as needed for anxiety or sleep. 30 tablet 0  . amoxicillin-clavulanate (AUGMENTIN) 875-125 MG tablet Take 1 tablet by mouth 2 (two) times daily. 20 tablet 0  . aspirin 81 MG chewable tablet Chew 81 mg by mouth daily.    . benzonatate (TESSALON) 100 MG capsule Take 1-2 capsules (100-200 mg total) by mouth every 8 (eight) hours as needed for cough. 30 capsule 0  . Cholecalciferol (VITAMIN D PO) Take 1 tablet by mouth daily.    Marland Kitchen HYDROcodone-acetaminophen (NORCO) 7.5-325 MG tablet Take 1 tablet by mouth every 6 (six) hours as needed for moderate pain. 90 tablet 0  . HYDROcodone-acetaminophen (NORCO) 7.5-325 MG tablet  Take 1 tablet by mouth every 6 (six) hours as needed for moderate pain or severe pain. 90 tablet 0  . HYDROcodone-acetaminophen (NORCO) 7.5-325 MG tablet Take 1 tablet by mouth every 6 (six) hours as needed for moderate pain. 90 tablet 0  . LYSINE PO Take 1 tablet by mouth daily.    . Magnesium 400 MG TABS Take 1 tablet by mouth daily.    . methocarbamol (  ROBAXIN) 500 MG tablet Take 1 tablet (500 mg total) by mouth every 8 (eight) hours as needed for muscle spasms. (Patient taking differently: Take 250 mg by mouth every 8 (eight) hours as needed for muscle spasms. ) 90 tablet 1  . nicotine polacrilex (NICORETTE) 4 MG lozenge Place 4 mg inside cheek every 3 (three) hours as needed for smoking cessation. For nicotine cravings.    . pantoprazole (PROTONIX) 40 MG tablet Take 1 tablet (40 mg total) by mouth 2 (two) times daily before a meal. 180 tablet 3  . Potassium Gluconate 595 MG CAPS Take 1 capsule by mouth daily.    Marland Kitchen QVAR REDIHALER 80 MCG/ACT AERB Inhale 2 puffs into the lungs 2 (two) times daily.  11   No current facility-administered medications on file prior to visit.     BP 106/64   Pulse 80   Temp 98.3 F (36.8 C) (Oral)   Ht _0  (1.702 m)   Wt 115 lb 1.9 oz (52.2 kg)   LMP 11/05/2004   SpO2 97%   BMI 18.03 kg/m    Objective:   Physical Exam  Constitutional: She appears well-nourished.  Appears much better  HENT:  Right Ear: Tympanic membrane and ear canal normal.  Left Ear: Tympanic membrane and ear canal normal.  Nose: Right sinus exhibits no maxillary sinus tenderness and no frontal sinus tenderness. Left sinus exhibits no maxillary sinus tenderness and no frontal sinus tenderness.  Mouth/Throat: Oropharynx is clear and moist.  Eyes: Conjunctivae are normal.  Neck: Neck supple.  Cardiovascular: Normal rate and regular rhythm.   Pulmonary/Chest: Effort normal and breath sounds normal. She has no wheezes. She has no rales.  Lymphadenopathy:    She has no cervical  adenopathy.  Skin: Skin is warm and dry.          Assessment & Plan:  Community Acquired Pneumonia:  Diagnosed last week, treated with Augmentin course. Overall feeling better, was scared with symptoms yesterday. Exam today very reassuring, prominent clear breath sounds from left lobes and RLL. Will attempt to contact pulmonologist again today to discuss recent CT scan. Discussed strict precautions including to notify if she develops fevers, vomiting, increased cough. Continue Augmentin course.  Sheral Flow, NP

## 2017-08-17 NOTE — Patient Instructions (Signed)
Continue Augmentin and Tessalon Perles.  Please message me if you develop fevers and/or start feeling worse.  It was a pleasure to see you today!

## 2017-08-18 ENCOUNTER — Ambulatory Visit
Admit: 2017-08-18 | Discharge: 2017-08-18 | Disposition: A | Discharge status: Home / Self Care | Payer: Commercial Managed Care - PPO

## 2017-08-18 ENCOUNTER — Encounter: Payer: 59 | Attending: Physical Medicine & Rehabilitation

## 2017-08-18 ENCOUNTER — Encounter: Payer: Self-pay | Admitting: Physical Medicine & Rehabilitation

## 2017-08-18 ENCOUNTER — Ambulatory Visit (HOSPITAL_BASED_OUTPATIENT_CLINIC_OR_DEPARTMENT_OTHER): Payer: 59 | Admitting: Physical Medicine & Rehabilitation

## 2017-08-18 VITALS — BP 109/73 | HR 86 | Resp 14

## 2017-08-18 DIAGNOSIS — F17210 Nicotine dependence, cigarettes, uncomplicated: Secondary | ICD-10-CM | POA: Insufficient documentation

## 2017-08-18 DIAGNOSIS — G588 Other specified mononeuropathies: Secondary | ICD-10-CM | POA: Insufficient documentation

## 2017-08-18 DIAGNOSIS — G8912 Acute post-thoracotomy pain: Secondary | ICD-10-CM | POA: Diagnosis not present

## 2017-08-18 DIAGNOSIS — K219 Gastro-esophageal reflux disease without esophagitis: Secondary | ICD-10-CM | POA: Insufficient documentation

## 2017-08-18 DIAGNOSIS — J449 Chronic obstructive pulmonary disease, unspecified: Secondary | ICD-10-CM | POA: Diagnosis not present

## 2017-08-18 DIAGNOSIS — J479 Bronchiectasis, uncomplicated: Principal | ICD-10-CM

## 2017-08-18 NOTE — Progress Notes (Signed)
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 PR INJECT NERV BLCK,INTERCOST,MULTPL [id number] (CPT)]    Informed consent was obtained at describing risks and benefits of procedure, including pneumothorax Patient placed in the left lateral decubitus position with the arm overhead. Linear ultrasound transducer identified symptomatic areas by sonopalpation at the mid axillary line at T7 and T8. Areas were marked, prepped with Betadine and then entered with 25-gauge 1.5 inch needle. Using 1% lidocaine to anesthetize the needle track. Direct ultrasound visualization was utilized. Needle entry point was above the rib inferior to the symptomatic nerve. Fluoroscopy was identified. Needle tip was Superior to the pleura and inferior to the corresponding rib. Once needle tip reached the inferior surface of the corresponding rib, 2.5 mL's of a solution containing 1 mL of Celestone 6 mL per cc and 4 mL's of 1% lidocaine was injected.  This was first performed at T7 and then at  T8. Patient tolerated procedure well

## 2017-08-18 NOTE — Patient Instructions (Signed)
Please call if you develop shortness of breath postprocedure. We'll do a follow-up in one month, if you are still having good, we'll see how long it lasts. If it has worn off, then we'll discuss other options

## 2017-08-30 ENCOUNTER — Encounter (HOSPITAL_COMMUNITY): Payer: Self-pay | Admitting: Psychiatry

## 2017-08-30 ENCOUNTER — Ambulatory Visit (INDEPENDENT_AMBULATORY_CARE_PROVIDER_SITE_OTHER): Payer: 59 | Admitting: Psychiatry

## 2017-08-30 VITALS — BP 123/80 | HR 81 | Temp 98.2°F | Resp 17 | Ht 64.0 in | Wt 114.2 lb

## 2017-08-30 DIAGNOSIS — Z79891 Long term (current) use of opiate analgesic: Secondary | ICD-10-CM | POA: Diagnosis not present

## 2017-08-30 DIAGNOSIS — F4322 Adjustment disorder with anxiety: Secondary | ICD-10-CM

## 2017-08-30 DIAGNOSIS — R52 Pain, unspecified: Secondary | ICD-10-CM

## 2017-08-30 DIAGNOSIS — Z79899 Other long term (current) drug therapy: Secondary | ICD-10-CM | POA: Diagnosis not present

## 2017-08-30 DIAGNOSIS — F17210 Nicotine dependence, cigarettes, uncomplicated: Secondary | ICD-10-CM | POA: Diagnosis not present

## 2017-08-30 DIAGNOSIS — G4700 Insomnia, unspecified: Secondary | ICD-10-CM | POA: Diagnosis not present

## 2017-08-30 DIAGNOSIS — F4323 Adjustment disorder with mixed anxiety and depressed mood: Secondary | ICD-10-CM

## 2017-08-30 MED ORDER — ALPRAZOLAM 0.25 MG PO TABS: 0.2500 mg | ORAL_TABLET | Freq: Every evening | ORAL | 4 refills | Status: DC | PRN

## 2017-08-30 NOTE — Progress Notes (Signed)
Ne pt who presents for treatment of anxiety and sleep. Process for visits explained. She was calm and polite.

## 2017-08-30 NOTE — Progress Notes (Signed)
Psychiatric Initial Adult Assessment   Patient Identification: [name] [name] MRN:  [id number] Date of Evaluation:  08/30/2017 Referral Source [place]  This patient is a 61 year old single female is fully is being evaluated for the use of a low-dose of Xanax together with her opiate pain medicine. This patient is presently not in a relationship. She's never been married and has no children. 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 For. 3 years she was out of work and very distressed. She is treated by her primary care doctor with Xanax 0.5 mg at night which worked very well. She can just as prescribed. She was never on an antidepressant but never got any other psychiatric treatment area 2012 she started a new job and is done very well since then. Last year she's been promoted. She works full-time. She's had significant medical problems. 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 She had complicated treatment with multiple antibiotics. The patient had lots of complications from her pneumonectomy. She has chronic pain. She also has a lot of orthopedic problems at some point was begun on hydrocodone 3 times a day. She takes all his medicines just as prescribed. This patient denies daily depression. She is sleeping and eating well. In the past she lost a lot of weight but not now. She's a good ability to concentrate. She is performing very well at work. She's a good sense of worth. She enjoys sewing reading newspapers watching TV. She's not suicidal and never has. The patient uses no alcohol. She uses no drugs. She's never had an issue of substance abuse. The patient is never had a clear episode of major depression nor of mania. While she is a chronic worrier she does not depression generalized anxiety. She does not have panic disorder. She has no OCD. The patient graduated from college. The patient is no past psychiatric  history. She's never been in a psychiatric hospital she's never been evaluated by a psychiatrist. She's never been in therapy. The only psychotropic drugs do not is low-dose Xanax now taking 0.25 mg at night. Is very effective in helping her sleep.  Chief Complaint    Anxiety     Visit Diagnosis:    ICD-10-CM   1. Insomnia, unspecified type G47.00 ALPRAZolam (XANAX) 0.25 MG tablet    History of Present Illness:    Associated Signs/Symptoms: Depression Symptoms:  insomnia, (Hypo) Manic Symptoms:   Anxiety Symptoms:  Excessive Worry, Psychotic Symptoms:   PTSD Symptoms:   Past Psychiatric History: Only Xanax 0.5 mg   Previous Psychotropic Medications: Yes   Substance Abuse History in the last 12 months:  No.  Consequences of Substance Abuse:   Past Medical History:  Past Medical History:  Diagnosis Date  . Arthritis   . B12 deficiency   . Bronchiectasis ([place])   . Cataracts, bilateral   . Colitis    induced by decongestants, NSAIds  . COPD (chronic obstructive pulmonary disease) ([place])    per CT 11/10/05, will minimal bronchiectasis rll  . Endometriosis   . GERD (gastroesophageal reflux disease)   . 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 Hemorrhoid   . Lichen planus   . 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 Osteoporosis    dexa per gyn  . Pneumothorax, spontaneous, tension    x 2 in her  20's.  has a chest tube for one  . Shoulder dislocation 2015    R shoulder , x 2   . 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 ESOPHAGOGASTRODUODENOSCOPY  12/03/2012   Procedure: ESOPHAGOGASTRODUODENOSCOPY (EGD);  Surgeon: [name], MD;  Location: [facility] ENDOSCOPY;  Service: Endoscopy;  Laterality: N/A;  . FLEXIBLE BRONCHOSCOPY  11/05/2012   Procedure: FLEXIBLE BRONCHOSCOPY;  Surgeon: [name], MD;  Location: [place];  Service: Thoracic;   Laterality: N/A;  . KNEE SURGERY     .not replacement .x8  . LOBECTOMY  11/05/2012   Procedure: LOBECTOMY;  Surgeon: [name], MD;  Location: [facility] OR;  Service: Thoracic;  Laterality: Right;  right upper lobectomy, and part of the right lower lobe  . MEDIASTINOSCOPY  05/25/2012   Procedure: MEDIASTINOSCOPY;  Surgeon: [name], MD;  Location: [facility] OR;  Service: Thoracic;  Laterality: N/A;  . ovarian cyst removed    . THORACOTOMY  11/05/2012   Procedure: THORACOTOMY MAJOR;  Surgeon: [name], MD;  Location: [facility] OR;  Service: Thoracic;  Laterality: Right;  . TONSILLECTOMY    . VIDEO BRONCHOSCOPY  02/15/2012   Procedure: VIDEO BRONCHOSCOPY WITH FLUORO;  Surgeon: [name], MD;  Location: [facility] ENDOSCOPY;  Service: Endoscopy;  Laterality: Bilateral;  WASHING- INTERVENTION BIOPSIES INTERVENTION BRONCHOSCOPY WITH VIDEO    Family Psychiatric History:   Family History:  Family History  Problem Relation Age of Onset  . Diabetes Mother        GM  . Heart disease Mother        in her 51s  . Heart disease Maternal Grandfather   . Cancer Father        sarcoma  . Diabetes Maternal Uncle   . Diabetes Maternal Grandmother   . Colon cancer Neg Hx   . Breast cancer Neg Hx   . Esophageal cancer Neg Hx   . Rectal cancer Neg Hx   . Stomach cancer Neg Hx     Social History:   Social History   Social History  . Marital status: Single    Spouse name: N/A  . Number of children: 0  . Years of education: N/A   Occupational History  . 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 Smoking status: Current Every Day Smoker    Packs/day: 0.40    Years: 38.00    Types: Cigarettes  . Smokeless tobacco: Never Used     Comment: ~ 1 ppd   . Alcohol use 4.2 oz/week    7 Glasses of wine per week     Comment: daily, 1 seving  . Drug use: No  . Sexual activity: Not Currently     Comment: 1st intercourse 61 yo-More than 5 partners   Other Topics Concern  . None    Social History Narrative   sister lives w/ pt    Lost mom 12-2013    Additional Social History:   Allergies:   Allergies  Allergen Reactions  . Alendronate Sodium Other (See Comments)    chest pain  . Antihistamines, Chlorpheniramine-Type Other (See Comments)    "makes lungs bleed"  . Benadryl [Diphenhydramine] Other (See Comments)    "makes lungs bleed" (patient states she can tolerate the newer ones, like Claritin)  . Nsaids Other (See Comments)    ischemic colitis  .  Other Other (See Comments)    (All anti-inflammatories) ischemic colitis  . Pheniramine Other (See Comments)    "makes lungs bleed"  . Pseudoephedrine Other (See Comments)    Ischemic colitis  . Levofloxacin Anxiety  . Robitussin A-C [Guaifenesin-Codeine] Itching and Other (See Comments)    Palms itching    Metabolic Disorder Labs: Lab Results  Component Value Date   HGBA1C 5.6 04/28/2017   No results found for: PROLACTIN Lab Results  Component Value Date   CHOL 183 04/28/2017   TRIG 51.0 04/28/2017   HDL 83.90 04/28/2017   CHOLHDL 2 04/28/2017   VLDL 10.2 04/28/2017   LDLCALC 89 04/28/2017   LDLCALC 115 (H) 04/25/2016     Current Medications: Current Outpatient Prescriptions  Medication Sig Dispense Refill  . acetaminophen (TYLENOL) 500 MG tablet Take 500 mg by mouth every 4 (four) hours as needed for moderate pain or fever. For fever    . albuterol (PROVENTIL HFA) 108 (90 Base) MCG/ACT inhaler Inhale 2 puffs into the lungs every 4 (four) hours as needed for wheezing or shortness of breath.    . ALPRAZolam (XANAX) 0.25 MG tablet Take 1 tablet (0.25 mg total) by mouth at bedtime as needed for anxiety or sleep. 30 tablet 4  . amoxicillin-clavulanate (AUGMENTIN) 875-125 MG tablet Take 1 tablet by mouth 2 (two) times daily. (Patient not taking: Reported on 08/30/2017) 20 tablet 0  . aspirin 81 MG chewable tablet Chew 81 mg by mouth daily.    . benzonatate (TESSALON) 100 MG capsule Take 1-2  capsules (100-200 mg total) by mouth every 8 (eight) hours as needed for cough. 30 capsule 0  . Cholecalciferol (VITAMIN D PO) Take 1 tablet by mouth daily.    Marland Kitchen HYDROcodone-acetaminophen (NORCO) 7.5-325 MG tablet Take 1 tablet by mouth every 6 (six) hours as needed for moderate pain. 90 tablet 0  . HYDROcodone-acetaminophen (NORCO) 7.5-325 MG tablet Take 1 tablet by mouth every 6 (six) hours as needed for moderate pain or severe pain. 90 tablet 0  . HYDROcodone-acetaminophen (NORCO) 7.5-325 MG tablet Take 1 tablet by mouth every 6 (six) hours as needed for moderate pain. 90 tablet 0  . LYSINE PO Take 1 tablet by mouth daily.    . Magnesium 400 MG TABS Take 1 tablet by mouth daily.    . methocarbamol (ROBAXIN) 500 MG tablet Take 1 tablet (500 mg total) by mouth every 8 (eight) hours as needed for muscle spasms. (Patient taking differently: Take 250 mg by mouth every 8 (eight) hours as needed for muscle spasms. ) 90 tablet 1  . nicotine polacrilex (NICORETTE) 4 MG lozenge Place 4 mg inside cheek every 3 (three) hours as needed for smoking cessation. For nicotine cravings.    . pantoprazole (PROTONIX) 40 MG tablet Take 1 tablet (40 mg total) by mouth 2 (two) times daily before a meal. 180 tablet 3  . Potassium Gluconate 595 MG CAPS Take 1 capsule by mouth daily.    Marland Kitchen QVAR REDIHALER 80 MCG/ACT AERB Inhale 2 puffs into the lungs 2 (two) times daily.  11   No current facility-administered medications for this visit.     Neurologic: Headache: No Seizure: No Paresthesias:No  Musculoskeletal: Strength & Muscle Tone: decreased Gait & Station: unsteady Patient leans: N/A  Psychiatric Specialty Exam: ROS  Blood pressure 123/80, pulse 81, temperature 98.2 F (36.8 C), temperature source Oral, resp. rate 17, height _0  (1.626 m), weight 114 lb 3.2 oz (51.8 kg), last menstrual period 11/05/2004,  SpO2 96 %.Body mass index is 19.6 kg/m.  General Appearance: Casual  Eye Contact:  Good  Speech:   Clear and Coherent  Volume:  Normal  Mood:  NA  Affect  Thought Process: normal   Goal Directed  Orientation:  Full (Time, Place, and Person)  Thought Content:  Logical  Suicidal Thoughts:  No  Homicidal Thoughts:  No  Memory:  NA  Judgement:  Good  Insight:  Good  Psychomotor Activity:  Normal  Concentration:    Recall:  Good  Fund of Knowledge:Good  Language: Good  Akathisia:  No  Handed:  Right  AIMS (if indicated):    Assets:    ADL's:  Intact  Cognition: WNL  Sleep:      Treatment Plan Summary: At this time in my pain this patient has no significant psychiatric problem. She does have some chronic anxiety and I think it makes her ruminating. She is great difficulty sleeping at night. With the use of Xanax and very low doses the patient does well. I will describe this patient's diagnosis in this is an adjustment disorder with anxious mood state. I would say regularly she worries a great deal specifically about losing her job which is very traumatic for her back in 2009. At this time a good night's sleep is extremely important to reduce her daytime anxiety. A good night's sleep is very important in that she has more energy and is more focused work. Low-dose of Xanax is working very well and I think he should continue. I also think she should continue getting pain by hydrocodone seems to also be affected. I will see this patient every few months and continue her Xanax 0.25 mg daily at bedtime.   Jerral Ralph, MD 9/26/20182:07 PM

## 2017-08-31 MED FILL — QVAR REDIHALER 80 MCG/ACT A: 80 | 30 days supply | Qty: 11 | Fill #2

## 2017-08-31 MED FILL — VENTOLIN HFA 90 MCG INHALER: 108 (90 BAS | 16 days supply | Qty: 18 | Fill #1

## 2017-09-01 MED FILL — ALPRAZolam 0.25 MG TABS: 0.25 | 30 days supply | Qty: 30 | Fill #0

## 2017-09-04 ENCOUNTER — Encounter: Payer: Self-pay | Admitting: Primary Care

## 2017-09-05 ENCOUNTER — Ambulatory Visit (INDEPENDENT_AMBULATORY_CARE_PROVIDER_SITE_OTHER): Payer: 59 | Admitting: Primary Care

## 2017-09-05 ENCOUNTER — Encounter: Payer: Self-pay | Admitting: Physical Medicine & Rehabilitation

## 2017-09-05 ENCOUNTER — Encounter: Payer: Self-pay | Admitting: Primary Care

## 2017-09-05 ENCOUNTER — Ambulatory Visit (INDEPENDENT_AMBULATORY_CARE_PROVIDER_SITE_OTHER)
Admission: RE | Admit: 2017-09-05 | Discharge: 2017-09-05 | Disposition: A | Discharge status: Home / Self Care | Payer: 59 | Source: Ambulatory Visit | Attending: Primary Care | Admitting: Primary Care

## 2017-09-05 VITALS — BP 114/72 | HR 102 | Temp 99.2°F | Ht 67.0 in | Wt 113.8 lb

## 2017-09-05 DIAGNOSIS — R05 Cough: Secondary | ICD-10-CM

## 2017-09-05 DIAGNOSIS — R059 Cough, unspecified: Secondary | ICD-10-CM

## 2017-09-05 NOTE — Progress Notes (Signed)
Subjective:    Patient ID: [name] [name], female    DOB: 1956-01-27, 61 y.o.   MRN: [id number]  HPI  [name] [name] is a 61 year old female with a history of chronic bronchiectasis, pneumonia, COPD who presents today with a chief complaint of fevers.   She was evaluated on 08/09/17 with complaints of cough, fevers, fatigue and was found to have multifocal pneumonia per CT chest. She was treated with a 10 day course of Augmentin and experienced improvement. She did follow up on 08/17/17 with a one day history of cough with expulsion of thick green mucous that had already improved during this visit.  Since her last visit she's noticed fevers ranging from 99-101 with her last fever being 101.8 this morning, body aches, headache, fatigue. Her symptoms began at 3 am on 09/04/17 with a progression of her symptoms since. She is also coughing with expulsion of yellow/green sputum that has been chronic since June 2018 with the main difference being fevers and body aches that began yesterday. She's been taking tylenol for fevers and body aches and Tessalon Perles sparingly for cough.    Review of Systems  Constitutional: Positive for chills, fatigue and fever.  HENT: Positive for congestion.   Respiratory: Positive for cough and shortness of breath.   Musculoskeletal: Positive for myalgias.       Past Medical History:  Diagnosis Date  . Arthritis   . B12 deficiency   . Bronchiectasis ([place])   . Cataracts, bilateral   . Colitis    induced by decongestants, NSAIds  . COPD (chronic obstructive pulmonary disease) ([place])    per CT 11/10/05, will minimal bronchiectasis rll  . Endometriosis   . GERD (gastroesophageal reflux disease)   . 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 Hemorrhoid   . Lichen planus   . 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 Osteoporosis    dexa per gyn  . Pneumothorax, spontaneous, tension    x 2 in her  20's.  has a chest tube for one  . Shoulder dislocation 2015    R shoulder , x 2   . 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 Marital status: Single    Spouse name: N/A  . Number of children: 0  . Years of education: N/A   Occupational History  . HR Coordinator  [place]    [employer]    Social History Main Topics  . Smoking status: Current Every Day Smoker    Packs/day: 0.40    Years: 38.00    Types: Cigarettes  . Smokeless tobacco: Never Used     Comment: ~ 1 ppd   . Alcohol use 4.2 oz/week    7 Glasses of wine per week     Comment: daily, 1 seving  . Drug use: No  . Sexual activity: Not Currently     Comment: 1st intercourse 61 yo-More than 5 partners   Other Topics Concern  . Not on file   Social History Narrative   sister lives w/ pt    Lost mom 12-2013    Past Surgical History:  Procedure Laterality Date  . ESOPHAGOGASTRODUODENOSCOPY  12/03/2012   Procedure: ESOPHAGOGASTRODUODENOSCOPY (EGD);  Surgeon: [name], MD;  Location: [facility] ENDOSCOPY;  Service: Endoscopy;  Laterality: N/A;  . FLEXIBLE BRONCHOSCOPY  11/05/2012   Procedure:  FLEXIBLE BRONCHOSCOPY;  Surgeon: [name], MD;  Location: [facility] OR;  Service: Thoracic;  Laterality: N/A;  . KNEE SURGERY     .not replacement .x8  . LOBECTOMY  11/05/2012   Procedure: LOBECTOMY;  Surgeon: [name], MD;  Location: [facility] OR;  Service: Thoracic;  Laterality: Right;  right upper lobectomy, and part of the right lower lobe  . MEDIASTINOSCOPY  05/25/2012   Procedure: MEDIASTINOSCOPY;  Surgeon: [name], MD;  Location: [facility] OR;  Service: Thoracic;  Laterality: N/A;  . ovarian cyst removed    . THORACOTOMY  11/05/2012   Procedure: THORACOTOMY MAJOR;  Surgeon: [name], MD;  Location[facility] OR;  Service: Thoracic;  Laterality: Right;  . TONSILLECTOMY    . VIDEO BRONCHOSCOPY  02/15/2012   Procedure: VIDEO BRONCHOSCOPY WITH FLUORO;  Surgeon: [name], MD;   Location: [facility] ENDOSCOPY;  Service: Endoscopy;  Laterality: Bilateral;  WASHING- INTERVENTION BIOPSIES INTERVENTION BRONCHOSCOPY WITH VIDEO    Family History  Problem Relation Age of Onset  . Diabetes Mother        GM  . Heart disease Mother        in her 63s  . Heart disease Maternal Grandfather   . Cancer Father        sarcoma  . Diabetes Maternal Uncle   . Diabetes Maternal Grandmother   . Colon cancer Neg Hx   . Breast cancer Neg Hx   . Esophageal cancer Neg Hx   . Rectal cancer Neg Hx   . Stomach cancer Neg Hx     Allergies  Allergen Reactions  . Pseudoephedrine Hcl Other (See Comments)    Has Ischemic Colitis. Makes colon bleed  . Alendronate Sodium Other (See Comments)    chest pain  . Antihistamines, Chlorpheniramine-Type Other (See Comments)    "makes lungs bleed"  . Benadryl [Diphenhydramine] Other (See Comments)    "makes lungs bleed" (patient states she can tolerate the newer ones, like Claritin)  . Nsaids Other (See Comments)    ischemic colitis  . Other Other (See Comments)    (All anti-inflammatories) ischemic colitis  . Pheniramine Other (See Comments)    "makes lungs bleed"  . Pseudoephedrine Other (See Comments)    Ischemic colitis  . Levofloxacin Anxiety  . Robitussin A-C [Guaifenesin-Codeine] Itching and Other (See Comments)    Palms itching    Current Outpatient Prescriptions on File Prior to Visit  Medication Sig Dispense Refill  . acetaminophen (TYLENOL) 500 MG tablet Take 500 mg by mouth every 4 (four) hours as needed for moderate pain or fever. For fever    . albuterol (PROVENTIL HFA) 108 (90 Base) MCG/ACT inhaler Inhale 2 puffs into the lungs every 4 (four) hours as needed for wheezing or shortness of breath.    . ALPRAZolam (XANAX) 0.25 MG tablet Take 1 tablet (0.25 mg total) by mouth at bedtime as needed for anxiety or sleep. 30 tablet 4  . aspirin 81 MG chewable tablet Chew 81 mg by mouth daily.    . benzonatate (TESSALON) 100 MG capsule  Take 1-2 capsules (100-200 mg total) by mouth every 8 (eight) hours as needed for cough. 30 capsule 0  . Cholecalciferol (VITAMIN D PO) Take 1 tablet by mouth daily.    Marland Kitchen HYDROcodone-acetaminophen (NORCO) 7.5-325 MG tablet Take 1 tablet by mouth every 6 (six) hours as needed for moderate pain. 90 tablet 0  . HYDROcodone-acetaminophen (NORCO) 7.5-325 MG tablet Take 1 tablet by mouth every 6 (  six) hours as needed for moderate pain or severe pain. 90 tablet 0  . HYDROcodone-acetaminophen (NORCO) 7.5-325 MG tablet Take 1 tablet by mouth every 6 (six) hours as needed for moderate pain. 90 tablet 0  . LYSINE PO Take 1 tablet by mouth daily.    . Magnesium 400 MG TABS Take 1 tablet by mouth daily.    . methocarbamol (ROBAXIN) 500 MG tablet Take 1 tablet (500 mg total) by mouth every 8 (eight) hours as needed for muscle spasms. (Patient taking differently: Take 250 mg by mouth every 8 (eight) hours as needed for muscle spasms. ) 90 tablet 1  . nicotine polacrilex (NICORETTE) 4 MG lozenge Place 4 mg inside cheek every 3 (three) hours as needed for smoking cessation. For nicotine cravings.    . pantoprazole (PROTONIX) 40 MG tablet Take 1 tablet (40 mg total) by mouth 2 (two) times daily before a meal. 180 tablet 3  . Potassium Gluconate 595 MG CAPS Take 1 capsule by mouth daily.    Marland Kitchen QVAR REDIHALER 80 MCG/ACT AERB Inhale 2 puffs into the lungs 2 (two) times daily.  11   No current facility-administered medications on file prior to visit.     BP 114/72   Pulse (!) 102   Temp 99.2 F (37.3 C) (Oral)   Ht _0  (1.702 m)   Wt 113 lb 12.8 oz (51.6 kg)   LMP 11/05/2004   SpO2 98%   BMI 17.82 kg/m    Objective:   Physical Exam  Constitutional: She appears well-nourished. She appears ill.  HENT:  Right Ear: Tympanic membrane and ear canal normal.  Left Ear: Tympanic membrane and ear canal normal.  Nose: Right sinus exhibits no maxillary sinus tenderness and no frontal sinus tenderness. Left sinus  exhibits no maxillary sinus tenderness and no frontal sinus tenderness.  Mouth/Throat: Oropharynx is clear and moist.  Eyes: Conjunctivae are normal.  Neck: Neck supple.  Cardiovascular: Normal rate and regular rhythm.   Pulmonary/Chest: Effort normal and breath sounds normal. She has no decreased breath sounds. She has no wheezes. She has no rales.  Lymphadenopathy:    She has no cervical adenopathy.  Skin: Skin is warm and dry.          Assessment & Plan:  URI:  Chronic productive cough x 3 months, fevers x 24 hours.  Exam today with good air movement, no crackles or diminished sounds noted.  Symptoms could be viral, but given history of CAP earlier last month will very resolve of pneumonia with chest xray. Continue tylenol PRN fevers. Continue Tessalon Perles. Will await xray results. Will attempt to contact patient's pulmonologist again.  Sheral Flow, NP

## 2017-09-05 NOTE — Patient Instructions (Signed)
Complete xray(s) prior to leaving today. I will notify you of your results once received.  Ensure you are staying hydrated with water and rest.  It was a pleasure to see you today!

## 2017-09-08 MED FILL — HYDROCODON-APAP 7.5-325: 7.5-325 | 23 days supply | Qty: 90 | Fill #0

## 2017-09-11 ENCOUNTER — Ambulatory Visit
Admit: 2017-09-11 | Discharge: 2017-09-11 | Disposition: A | Discharge status: Home / Self Care | Payer: Commercial Managed Care - PPO

## 2017-09-11 DIAGNOSIS — J470 Bronchiectasis with acute lower respiratory infection: Principal | ICD-10-CM

## 2017-09-12 ENCOUNTER — Ambulatory Visit
Admission: RE | Admit: 2017-09-12 | Discharge: 2017-09-12 | Disposition: A | Discharge status: Home / Self Care | Payer: Commercial Managed Care - PPO | Attending: Internal Medicine

## 2017-09-12 DIAGNOSIS — J479 Bronchiectasis, uncomplicated: Secondary | ICD-10-CM | POA: Diagnosis not present

## 2017-09-12 DIAGNOSIS — F419 Anxiety disorder, unspecified: Secondary | ICD-10-CM | POA: Diagnosis not present

## 2017-09-12 DIAGNOSIS — R091 Pleurisy: Secondary | ICD-10-CM | POA: Diagnosis not present

## 2017-09-12 DIAGNOSIS — G8912 Acute post-thoracotomy pain: Secondary | ICD-10-CM | POA: Diagnosis not present

## 2017-09-12 DIAGNOSIS — Z888 Allergy status to other drugs, medicaments and biological substances status: Secondary | ICD-10-CM | POA: Diagnosis not present

## 2017-09-12 DIAGNOSIS — J439 Emphysema, unspecified: Secondary | ICD-10-CM | POA: Diagnosis not present

## 2017-09-12 DIAGNOSIS — J45909 Unspecified asthma, uncomplicated: Secondary | ICD-10-CM | POA: Diagnosis not present

## 2017-09-12 DIAGNOSIS — Z7982 Long term (current) use of aspirin: Secondary | ICD-10-CM | POA: Diagnosis not present

## 2017-09-12 DIAGNOSIS — F17210 Nicotine dependence, cigarettes, uncomplicated: Secondary | ICD-10-CM | POA: Diagnosis not present

## 2017-09-12 MED ORDER — ALBUTEROL SULFATE 2.5 MG/3 ML (0.083 %) SOLUTION FOR NEBULIZATION
Freq: Two times a day (BID) | RESPIRATORY_TRACT | 11 refills | 0 days | Status: SS
Start: 2017-09-12 — End: 2017-11-03

## 2017-09-12 MED ORDER — AMOXICILLIN 875 MG-POTASSIUM CLAVULANATE 125 MG TABLET
ORAL_TABLET | Freq: Two times a day (BID) | ORAL | 0 refills | 0.00000 days | Status: CP
Start: 2017-09-12 — End: 2017-10-24

## 2017-09-12 MED ORDER — AMOXICILLIN 875 MG-POTASSIUM CLAVULANATE 125 MG TABLET: 1 | tablet | Freq: Two times a day (BID) | 0 refills | 0 days | Status: AC

## 2017-09-12 MED FILL — ALBUTEROL 0.083% INHAL SOLN: (2.5 MG/3ML | 30 days supply | Qty: 180 | Fill #0

## 2017-09-12 MED FILL — AMOX TR-K CLV 875-125 MG TA: 875-125 | 42 days supply | Qty: 84 | Fill #0

## 2017-09-15 ENCOUNTER — Ambulatory Visit: Payer: 59 | Admitting: Physical Medicine & Rehabilitation

## 2017-09-30 MED FILL — ALPRAZolam 0.25 MG TABS: 0.25 | 30 days supply | Qty: 30 | Fill #1

## 2017-10-02 ENCOUNTER — Encounter: Payer: Self-pay | Admitting: Primary Care

## 2017-10-02 LAB — HM DIABETES EYE EXAM

## 2017-10-02 MED ORDER — SODIUM CHLORIDE 3 % FOR NEBULIZATION
11 refills | 0 days | Status: SS
Start: 2017-10-02 — End: 2017-11-03

## 2017-10-02 MED FILL — SODIUM CHLORIDE 3% VIAL: 3 | 30 days supply | Qty: 240 | Fill #0

## 2017-10-03 DIAGNOSIS — J479 Bronchiectasis, uncomplicated: Secondary | ICD-10-CM | POA: Diagnosis not present

## 2017-10-09 MED FILL — HYDROCODON-APAP 7.5-325: 7.5-325 | 22 days supply | Qty: 90 | Fill #0

## 2017-10-15 ENCOUNTER — Encounter: Payer: Self-pay | Admitting: Primary Care

## 2017-10-22 MED FILL — VENTOLIN HFA 90 MCG INHALER: 108 (90 BAS | 16 days supply | Qty: 18 | Fill #2

## 2017-10-23 MED FILL — QVAR REDIHALER 80 MCG/ACT A: 80 | 30 days supply | Qty: 11 | Fill #3

## 2017-10-26 ENCOUNTER — Ambulatory Visit: Payer: Self-pay | Admitting: Emergency Medicine

## 2017-10-26 VITALS — BP 102/70 | HR 101 | Temp 98.5°F | Wt 110.2 lb

## 2017-10-26 DIAGNOSIS — J069 Acute upper respiratory infection, unspecified: Secondary | ICD-10-CM

## 2017-10-26 MED ORDER — BENZONATATE 100 MG PO CAPS: 100.0000 mg | ORAL_CAPSULE | Freq: Three times a day (TID) | ORAL | 0 refills | Status: DC | PRN

## 2017-10-26 MED ORDER — AZITHROMYCIN 250 MG PO TABS: ORAL_TABLET | ORAL | 0 refills | Status: DC

## 2017-10-26 MED ORDER — PREDNISONE 50 MG PO TABS: ORAL_TABLET | ORAL | 0 refills | Status: DC

## 2017-10-26 NOTE — Progress Notes (Signed)
[name] [name] is a 61 y.o. female who presents for evaluation of URI like symptoms.  Symptoms include achiness, congestion, cough described as productive of yellow, green and thick sputum, fever: fevers up to 102 degrees, nasal congestion and wheezing.  Onset of symptoms was 4 days ago, and has been gradually worsening since that time.  Treatment to date:  acetaminophen, antibiotics and rest.  High risk factors for influenza complications:  co-morbid illness. Patient has past Hx of COPD and chronic infections, she is under the care of a pulmonologist and finished a 6 week course of augmentin 3 days ago. She is unable to see her pulmonologist today due to the holiday.   The following portions of the patient's history were reviewed and updated as appropriate:  allergies, current medications and past medical history.  Review of Systems  Constitutional: Positive for chills, fever and malaise/fatigue.  HENT: Positive for congestion and sinus pain.   Respiratory: Positive for cough, sputum production, shortness of breath and wheezing.   Cardiovascular: Negative for chest pain and palpitations.  Gastrointestinal: Negative.   Genitourinary: Negative for dysuria, frequency and urgency.  Musculoskeletal: Negative.   Skin: Negative.   Neurological: Negative.     Objective:   Vitals:   10/26/17 0822  BP: 102/70  Pulse: (!) 101  Temp: 98.5 F (36.9 C)  SpO2: 98%    Physical Exam  Constitutional: She is well-developed, well-nourished, and in no distress. No distress.  HENT:  Head: Normocephalic.  Mouth/Throat: Oropharynx is clear and moist. No oropharyngeal exudate.  Eyes: Conjunctivae are normal.  Neck: Normal range of motion.  Cardiovascular: Normal rate and regular rhythm.  Pulmonary/Chest: Effort normal and breath sounds normal.  Abdominal: Soft.  Lymphadenopathy:    She has cervical adenopathy.  Neurological: She is alert.  Skin: Skin is warm and dry. She is not diaphoretic.   Nursing note and vitals reviewed.    Assessment:   1. Upper respiratory tract infection, unspecified type       Plan:  1. Upper respiratory tract infection, unspecified type Rest, fluids, tylenol for fever. Contact pulmonologist for follow up as needed, or go to the ER anytime symptoms worsen. - benzonatate (TESSALON PERLES) 100 MG capsule; Take 1 capsule (100 mg total) by mouth 3 (three) times daily as needed for cough.  Dispense: 30 capsule; Refill: 0 - azithromycin (ZITHROMAX) 250 MG tablet; Take two tablets today, then one daily till finished  Dispense: 6 tablet; Refill: 0 - predniSONE (DELTASONE) 50 MG tablet; Take 1 tablet daily with food  Dispense: 5 tablet; Refill: 0

## 2017-10-26 NOTE — Patient Instructions (Signed)
Upper Respiratory Infection, Adult Most upper respiratory infections (URIs) are caused by a virus. A URI affects the nose, throat, and upper air passages. The most common type of URI is often called "the common cold." Follow these instructions at home:  Take medicines only as told by your doctor.  Gargle warm saltwater or take cough drops to comfort your throat as told by your doctor.  Use a warm mist humidifier or inhale steam from a shower to increase air moisture. This may make it easier to breathe.  Drink enough fluid to keep your pee (urine) clear or pale yellow.  Eat soups and other clear broths.  Have a healthy diet.  Rest as needed.  Go back to work when your fever is gone or your doctor says it is okay. ? You may need to stay home longer to avoid giving your URI to others. ? You can also wear a face mask and wash your hands often to prevent spread of the virus.  Use your inhaler more if you have asthma.  Do not use any tobacco products, including cigarettes, chewing tobacco, or electronic cigarettes. If you need help quitting, ask your doctor. Contact a doctor if:  You are getting worse, not better.  Your symptoms are not helped by medicine.  You have chills.  You are getting more short of breath.  You have brown or red mucus.  You have yellow or brown discharge from your nose.  You have pain in your face, especially when you bend forward.  You have a fever.  You have puffy (swollen) neck glands.  You have pain while swallowing.  You have white areas in the back of your throat. Get help right away if:  You have very bad or constant: ? Headache. ? Ear pain. ? Pain in your forehead, behind your eyes, and over your cheekbones (sinus pain). ? Chest pain.  You have long-lasting (chronic) lung disease and any of the following: ? Wheezing. ? Long-lasting cough. ? Coughing up blood. ? A change in your usual mucus.  You have a stiff neck.  You have  changes in your: ? Vision. ? Hearing. ? Thinking. ? Mood. This information is not intended to replace advice given to you by your health care provider. Make sure you discuss any questions you have with your health care provider. Document Released: 05/09/2008 Document Revised: 07/24/2016 Document Reviewed: 02/26/2014 Elsevier Interactive Patient Education  2018 [employer] Inc.  

## 2017-10-27 MED FILL — predniSONE 50 MG TABS: 50 | 5 days supply | Qty: 5 | Fill #0

## 2017-10-27 MED FILL — BENZONATATE 100 MG CAP: 100 | 10 days supply | Qty: 30 | Fill #0

## 2017-10-27 MED FILL — AZITHROMYCIN 250 MG TAB: 250 | 5 days supply | Qty: 6 | Fill #0

## 2017-10-28 ENCOUNTER — Encounter: Payer: Self-pay | Admitting: Primary Care

## 2017-10-28 NOTE — Progress Notes (Signed)
Wanted to cal patient to follow up with her about her visit with [facility], Patient sent her pcp  email  concering her cough and visit. She states that she has an appointment with them on 11/01/17. Patients preferred method of contact is via email

## 2017-10-29 ENCOUNTER — Encounter: Payer: Self-pay | Admitting: Primary Care

## 2017-10-30 ENCOUNTER — Ambulatory Visit: Payer: Self-pay

## 2017-10-30 ENCOUNTER — Telehealth: Payer: Self-pay

## 2017-10-30 ENCOUNTER — Ambulatory Visit: Payer: Self-pay | Admitting: Internal Medicine

## 2017-10-30 MED FILL — ALPRAZolam 0.25 MG TABS: 0.25 | 30 days supply | Qty: 30 | Fill #2

## 2017-10-30 NOTE — Telephone Encounter (Signed)
  Reason for Disposition . SEVERE coughing spells (e.g., whooping sound after coughing, vomiting after coughing)  Answer Assessment - Initial Assessment Questions 1. ONSET: "When did the cough begin?"      September with pneumonia. Saw Dr. Olena Heckle in October 2. SEVERITY: "How bad is the cough today?"      Pretty in bad 3. RESPIRATORY DISTRESS: "Describe your breathing."      Shortness of breath at times. 4. FEVER: "Do you have a fever?" If so, ask: "What is your temperature, how was it measured, and when did it start?"     No fever today. At midnight 102.3 oral. 5. SPUTUM: "Describe the color of your sputum" (clear, white, yellow, green)     Grey,yellow,green 6. HEMOPTYSIS: "Are you coughing up any blood?" If so ask: "How much?" (flecks, streaks, tablespoons, etc.)     No blood 7. CARDIAC HISTORY: "Do you have any history of heart disease?" (e.g., heart attack, congestive heart failure)      No 8. LUNG HISTORY: "Do you have any history of lung disease?"  (e.g., pulmonary embolus, asthma, emphysema)     Surgery 5 tears ago microbacterium. 9. PE RISK FACTORS: "Do you have a history of blood clots?" (or: recent major surgery, recent prolonged travel, bedridden )     No 10. OTHER SYMPTOMS: "Do you have any other symptoms?" (e.g., runny nose, wheezing, chest pain)       Body aches, headaches 11. PREGNANCY: "Is there any chance you are pregnant?" "When was your last menstrual period?"       No  12. TRAVEL: "Have you traveled out of the country in the last month?" (e.g., travel history, exposures)       No  Protocols used: [place] Pt. Reports she coughed up 2 staples from a surgery 5 years ago. Offered appointment for today. Refused. Only wants to see.[name] [name]. Appointment made for tomorrow.

## 2017-10-31 ENCOUNTER — Inpatient Hospital Stay
Admission: EM | Admit: 2017-10-31 | Discharge: 2017-11-30 | Disposition: A | Discharge status: Home Health | Payer: Commercial Managed Care - PPO | Source: Intra-hospital

## 2017-10-31 ENCOUNTER — Inpatient Hospital Stay
Admission: EM | Admit: 2017-10-31 | Discharge: 2017-11-30 | Disposition: A | Discharge status: Home Health | Payer: Commercial Managed Care - PPO | Source: Intra-hospital | Attending: Certified Registered" | Admitting: Certified Registered"

## 2017-10-31 ENCOUNTER — Inpatient Hospital Stay
Admission: EM | Admit: 2017-10-31 | Discharge: 2017-11-30 | Disposition: A | Discharge status: Home Health | Payer: Commercial Managed Care - PPO | Source: Intra-hospital | Attending: Internal Medicine | Admitting: Internal Medicine

## 2017-10-31 ENCOUNTER — Ambulatory Visit (INDEPENDENT_AMBULATORY_CARE_PROVIDER_SITE_OTHER): Payer: 59 | Admitting: Primary Care

## 2017-10-31 VITALS — BP 122/80 | HR 78 | Temp 98.2°F | Ht 67.0 in | Wt 108.8 lb

## 2017-10-31 DIAGNOSIS — D509 Iron deficiency anemia, unspecified: Secondary | ICD-10-CM | POA: Diagnosis not present

## 2017-10-31 DIAGNOSIS — R718 Other abnormality of red blood cells: Secondary | ICD-10-CM | POA: Diagnosis not present

## 2017-10-31 DIAGNOSIS — J189 Pneumonia, unspecified organism: Secondary | ICD-10-CM | POA: Diagnosis not present

## 2017-10-31 DIAGNOSIS — R0682 Tachypnea, not elsewhere classified: Secondary | ICD-10-CM | POA: Diagnosis not present

## 2017-10-31 DIAGNOSIS — B449 Aspergillosis, unspecified: Secondary | ICD-10-CM | POA: Diagnosis not present

## 2017-10-31 DIAGNOSIS — I959 Hypotension, unspecified: Secondary | ICD-10-CM | POA: Diagnosis not present

## 2017-10-31 DIAGNOSIS — J440 Chronic obstructive pulmonary disease with acute lower respiratory infection: Secondary | ICD-10-CM | POA: Diagnosis not present

## 2017-10-31 DIAGNOSIS — G8929 Other chronic pain: Secondary | ICD-10-CM | POA: Diagnosis not present

## 2017-10-31 DIAGNOSIS — R509 Fever, unspecified: Secondary | ICD-10-CM | POA: Diagnosis not present

## 2017-10-31 DIAGNOSIS — J479 Bronchiectasis, uncomplicated: Secondary | ICD-10-CM

## 2017-10-31 DIAGNOSIS — F17210 Nicotine dependence, cigarettes, uncomplicated: Secondary | ICD-10-CM | POA: Diagnosis not present

## 2017-10-31 DIAGNOSIS — R109 Unspecified abdominal pain: Secondary | ICD-10-CM

## 2017-10-31 DIAGNOSIS — D72829 Elevated white blood cell count, unspecified: Secondary | ICD-10-CM | POA: Diagnosis not present

## 2017-10-31 DIAGNOSIS — J939 Pneumothorax, unspecified: Secondary | ICD-10-CM | POA: Diagnosis not present

## 2017-10-31 DIAGNOSIS — G8922 Chronic post-thoracotomy pain: Secondary | ICD-10-CM | POA: Diagnosis not present

## 2017-10-31 DIAGNOSIS — J449 Chronic obstructive pulmonary disease, unspecified: Secondary | ICD-10-CM | POA: Diagnosis not present

## 2017-10-31 DIAGNOSIS — J860 Pyothorax with fistula: Secondary | ICD-10-CM | POA: Diagnosis not present

## 2017-10-31 DIAGNOSIS — D696 Thrombocytopenia, unspecified: Secondary | ICD-10-CM | POA: Diagnosis not present

## 2017-10-31 DIAGNOSIS — E611 Iron deficiency: Secondary | ICD-10-CM | POA: Diagnosis not present

## 2017-10-31 DIAGNOSIS — B441 Other pulmonary aspergillosis: Secondary | ICD-10-CM | POA: Diagnosis not present

## 2017-10-31 DIAGNOSIS — R918 Other nonspecific abnormal finding of lung field: Secondary | ICD-10-CM | POA: Diagnosis not present

## 2017-10-31 DIAGNOSIS — Z8619 Personal history of other infectious and parasitic diseases: Secondary | ICD-10-CM | POA: Diagnosis not present

## 2017-10-31 DIAGNOSIS — R9431 Abnormal electrocardiogram [ECG] [EKG]: Secondary | ICD-10-CM | POA: Diagnosis not present

## 2017-10-31 DIAGNOSIS — E440 Moderate protein-calorie malnutrition: Secondary | ICD-10-CM | POA: Diagnosis not present

## 2017-10-31 DIAGNOSIS — B340 Adenovirus infection, unspecified: Secondary | ICD-10-CM | POA: Diagnosis not present

## 2017-10-31 DIAGNOSIS — B970 Adenovirus as the cause of diseases classified elsewhere: Secondary | ICD-10-CM | POA: Diagnosis not present

## 2017-10-31 DIAGNOSIS — Z48813 Encounter for surgical aftercare following surgery on the respiratory system: Secondary | ICD-10-CM | POA: Diagnosis not present

## 2017-10-31 DIAGNOSIS — R000 Tachycardia, unspecified: Secondary | ICD-10-CM | POA: Diagnosis not present

## 2017-10-31 DIAGNOSIS — D473 Essential (hemorrhagic) thrombocythemia: Secondary | ICD-10-CM | POA: Diagnosis not present

## 2017-10-31 DIAGNOSIS — Z452 Encounter for adjustment and management of vascular access device: Secondary | ICD-10-CM | POA: Diagnosis not present

## 2017-10-31 DIAGNOSIS — R05 Cough: Secondary | ICD-10-CM | POA: Diagnosis not present

## 2017-10-31 DIAGNOSIS — Z902 Acquired absence of lung [part of]: Secondary | ICD-10-CM | POA: Diagnosis not present

## 2017-10-31 DIAGNOSIS — R0602 Shortness of breath: Secondary | ICD-10-CM | POA: Diagnosis not present

## 2017-10-31 DIAGNOSIS — J470 Bronchiectasis with acute lower respiratory infection: Secondary | ICD-10-CM | POA: Diagnosis not present

## 2017-10-31 DIAGNOSIS — Z942 Lung transplant status: Secondary | ICD-10-CM | POA: Diagnosis not present

## 2017-10-31 DIAGNOSIS — Z8709 Personal history of other diseases of the respiratory system: Secondary | ICD-10-CM | POA: Diagnosis not present

## 2017-10-31 DIAGNOSIS — R0600 Dyspnea, unspecified: Secondary | ICD-10-CM | POA: Diagnosis not present

## 2017-10-31 DIAGNOSIS — R0789 Other chest pain: Secondary | ICD-10-CM | POA: Diagnosis not present

## 2017-10-31 DIAGNOSIS — J9811 Atelectasis: Secondary | ICD-10-CM | POA: Diagnosis not present

## 2017-10-31 DIAGNOSIS — R5081 Fever presenting with conditions classified elsewhere: Secondary | ICD-10-CM | POA: Diagnosis not present

## 2017-10-31 DIAGNOSIS — F1120 Opioid dependence, uncomplicated: Secondary | ICD-10-CM | POA: Diagnosis not present

## 2017-10-31 DIAGNOSIS — J181 Lobar pneumonia, unspecified organism: Secondary | ICD-10-CM | POA: Diagnosis not present

## 2017-10-31 DIAGNOSIS — Z792 Long term (current) use of antibiotics: Secondary | ICD-10-CM | POA: Diagnosis not present

## 2017-10-31 LAB — POC URINALSYSI DIPSTICK (AUTOMATED)
BILIRUBIN UA: NEGATIVE
Blood, UA: NEGATIVE
GLUCOSE UA: NEGATIVE
Ketones, UA: NEGATIVE
LEUKOCYTES UA: NEGATIVE
NITRITE UA: NEGATIVE
Protein, UA: NEGATIVE
Spec Grav, UA: 1.01 (ref 1.010–1.025)
UROBILINOGEN UA: 0.2 U/dL
pH, UA: 6.5 (ref 5.0–8.0)

## 2017-10-31 NOTE — Assessment & Plan Note (Signed)
Managed on hydrocodone/apap every 6 hours for chronic pain secondary to lung disease and surgery.  PMP aware reviewed, no suspicious activity.  UDS up to date. Will print 3 month supply next week when due.

## 2017-10-31 NOTE — Patient Instructions (Signed)
Please keep me updated regarding your admission, schedule a follow up visit with me after your admission.  Please have HR send me additional FMLA paper work so we can adjust.  It was a pleasure to see you today!

## 2017-10-31 NOTE — Assessment & Plan Note (Signed)
Recently coughed up staples from partial lobectomy years ago. Recurrent cough with pneumonia over the last 6 months. Will be admitted to [facility] for treatment including PICC line insertion for antibiotic treatment and CT scan. She will consider palliative care per pulmonology. She will also schedule a hospital follow up once discharged.  UA today negative. Incontinence likely from recent forceful cough.

## 2017-10-31 NOTE — Progress Notes (Signed)
Subjective:    Patient ID: [name] [name], female    DOB: 1956/09/19, 61 y.o.   MRN: [id number]  HPI  [name] is a 61 year old female with a history of partial lobectomy, chronic lung disease, bronchiectasis, pneumonia who presents today with a chief complaint of cough. She coughed up several staples 3 days ago from a prior lung surgery years ago. She is to be admitted for PICC line insertion and antibiotics today per her pulmonologist. She will also undergo a CT scan as well. Her pulmonologist notified her that she may need to consider palliative care.   Recent symptoms present for the past 1-2 weeks with cough, fevers (101-102). She's also noticed some urinary incontinence since she's coughing so much. She did experience dysuria 2 weeks ago, she thought this was a yeast infection given that she'd been on recurrent antibiotics. She took a diflucan at that time with improvement. She went to Urgent Care on 10/28/17 and was provided with a prescription for steroids and azithromycin for her cough.   She will be due for a refill of Norco next week. She's taking this every 6 hours, everyday for her chronic lung disease.    Review of Systems  Constitutional: Positive for fatigue and fever.  Respiratory: Positive for cough and shortness of breath.   Cardiovascular: Negative for chest pain.  Genitourinary:       Intermittent incontinence        Past Medical History:  Diagnosis Date  . Arthritis   . B12 deficiency   . Bronchiectasis ([place])   . Cataracts, bilateral   . Colitis    induced by decongestants, NSAIds  . COPD (chronic obstructive pulmonary disease) ([place])    per CT 11/10/05, will minimal bronchiectasis rll  . Endometriosis   . GERD (gastroesophageal reflux disease)   . 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 Hemorrhoid   . Lichen planus   . 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 Osteoporosis    dexa per  gyn  . Pneumothorax, spontaneous, tension    x 2 in her 20's.  has a chest tube for one  . Shoulder dislocation 2015    R shoulder , x 2   . 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 Marital status: Single    Spouse name: Not on file  . Number of children: 0  . Years of education: Not on file  . Highest education level: Not on file  Social Needs  . Financial resource strain: Not on file  . Food insecurity - worry: Not on file  . Food insecurity - inability: Not on file  . Transportation needs - medical: Not on file  . Transportation needs - non-medical: Not on file  Occupational History  . Occupation: HR Coordinator     Employer: [employer]    Comment: [employer]   Tobacco Use  . Smoking status: Current Every Day Smoker    Packs/day: 0.40    Years: 38.00    Pack years: 15.20    Types: Cigarettes  . Smokeless tobacco: Never Used  . Tobacco comment: ~ 1 ppd   Substance and Sexual Activity  . Alcohol use: Yes    Alcohol/week: 4.2 oz    Types: 7 Glasses of wine per week    Comment: daily, 1 seving  . Drug  use: No  . Sexual activity: Not Currently    Comment: 1st intercourse 61 yo-More than 5 partners  Other Topics Concern  . Not on file  Social History Narrative   sister lives w/ pt    Lost mom 12-2013    Past Surgical History:  Procedure Laterality Date  . ESOPHAGOGASTRODUODENOSCOPY  12/03/2012   Procedure: ESOPHAGOGASTRODUODENOSCOPY (EGD);  Surgeon: [name], MD;  Location: [facility] ENDOSCOPY;  Service: Endoscopy;  Laterality: N/A;  . FLEXIBLE BRONCHOSCOPY  11/05/2012   Procedure: FLEXIBLE BRONCHOSCOPY;  Surgeon: [name], MD;  Location: [place];  Service: Thoracic;  Laterality: N/A;  . KNEE SURGERY     .not replacement .x8  . LOBECTOMY  11/05/2012   Procedure: LOBECTOMY;  Surgeon: [name], MD;  Location: [facility] OR;  Service: Thoracic;  Laterality: Right;  right upper lobectomy, and part of the right lower lobe   . MEDIASTINOSCOPY  05/25/2012   Procedure: MEDIASTINOSCOPY;  Surgeon: [name], MD;  [facility];  Service: Thoracic;  Laterality: N/A;  . ovarian cyst removed    . THORACOTOMY  11/05/2012   Procedure: THORACOTOMY MAJOR;  Surgeon: [name], MD;  Location[facility] OR;  Service: Thoracic;  Laterality: Right;  . TONSILLECTOMY    . VIDEO BRONCHOSCOPY  02/15/2012   Procedure: VIDEO BRONCHOSCOPY WITH FLUORO;  Surgeon: [name], MD;  Location: [facility] ENDOSCOPY;  Service: Endoscopy;  Laterality: Bilateral;  WASHING- INTERVENTION BIOPSIES INTERVENTION BRONCHOSCOPY WITH VIDEO    Family History  Problem Relation Age of Onset  . Diabetes Mother        GM  . Heart disease Mother        in her 70s  . Heart disease Maternal Grandfather   . Cancer Father        sarcoma  . Diabetes Maternal Uncle   . Diabetes Maternal Grandmother   . Colon cancer Neg Hx   . Breast cancer Neg Hx   . Esophageal cancer Neg Hx   . Rectal cancer Neg Hx   . Stomach cancer Neg Hx     Allergies  Allergen Reactions  . Diphenhydramine Hcl Other (See Comments)    Makes lungs bleed  . Pseudoephedrine Hcl Other (See Comments)    Has Ischemic Colitis. Makes colon bleed  . Alendronate Sodium Other (See Comments)    chest pain  . Antihistamines, Chlorpheniramine-Type Other (See Comments)    "makes lungs bleed"  . Benadryl [Diphenhydramine] Other (See Comments)    "makes lungs bleed" (patient states she can tolerate the newer ones, like Claritin)  . Levofloxacin Anxiety  . Nsaids Other (See Comments)    ischemic colitis  . Other Other (See Comments)    (All anti-inflammatories) ischemic colitis  . Pheniramine Other (See Comments)    "makes lungs bleed"  . Pseudoephedrine Other (See Comments)    Ischemic colitis  . Alendronate Sodium Other (See Comments)    Heart burn  . Guaifenesin Itching  . Robitussin A-C [Guaifenesin-Codeine] Itching and Other (See Comments)    Palms itching    Current Outpatient  Medications on File Prior to Visit  Medication Sig Dispense Refill  . acetaminophen (TYLENOL) 500 MG tablet Take 500 mg by mouth every 4 (four) hours as needed for moderate pain or fever. For fever    . albuterol (PROVENTIL HFA) 108 (90 Base) MCG/ACT inhaler Inhale 2 puffs into the lungs every 4 (four) hours as needed for wheezing or shortness of breath.    Marland Kitchen  ALPRAZolam (XANAX) 0.25 MG tablet Take 1 tablet (0.25 mg total) by mouth at bedtime as needed for anxiety or sleep. 30 tablet 4  . aspirin 81 MG chewable tablet Chew 81 mg by mouth daily.    . Cholecalciferol (VITAMIN D PO) Take 1 tablet by mouth daily.    Marland Kitchen HYDROcodone-acetaminophen (NORCO) 7.5-325 MG tablet Take 1 tablet by mouth every 6 (six) hours as needed for moderate pain. 90 tablet 0  . HYDROcodone-acetaminophen (NORCO) 7.5-325 MG tablet Take 1 tablet by mouth every 6 (six) hours as needed for moderate pain or severe pain. 90 tablet 0  . HYDROcodone-acetaminophen (NORCO) 7.5-325 MG tablet Take 1 tablet by mouth every 6 (six) hours as needed for moderate pain. 90 tablet 0  . LYSINE PO Take 1 tablet by mouth daily.    . Magnesium 400 MG TABS Take 1 tablet by mouth daily.    . methocarbamol (ROBAXIN) 500 MG tablet Take 1 tablet (500 mg total) by mouth every 8 (eight) hours as needed for muscle spasms. (Patient taking differently: Take 250 mg by mouth every 8 (eight) hours as needed for muscle spasms. ) 90 tablet 1  . nicotine polacrilex (NICORETTE) 4 MG lozenge Place 4 mg inside cheek every 3 (three) hours as needed for smoking cessation. For nicotine cravings.    . Ondansetron HCl (ZOFRAN PO) Take 4 mg by mouth as needed.    . pantoprazole (PROTONIX) 40 MG tablet Take 1 tablet (40 mg total) by mouth 2 (two) times daily before a meal. 180 tablet 3  . Potassium Gluconate 595 MG CAPS Take 1 capsule by mouth daily.    Marland Kitchen QVAR REDIHALER 80 MCG/ACT AERB Inhale 2 puffs into the lungs 2 (two) times daily.  11  . benzonatate (TESSALON PERLES) 100  MG capsule Take 1 capsule (100 mg total) by mouth 3 (three) times daily as needed for cough. (Patient not taking: Reported on 10/31/2017) 30 capsule 0   No current facility-administered medications on file prior to visit.     BP 122/80   Pulse 78   Temp 98.2 F (36.8 C) (Oral)   Ht _0  (1.702 m)   Wt 108 lb 12.8 oz (49.4 kg)   LMP 11/05/2004   SpO2 98%   BMI 17.04 kg/m    Objective:   Physical Exam  Constitutional: She appears well-nourished. She appears ill.  Neck: Neck supple.  Cardiovascular: Normal rate and regular rhythm.  Pulmonary/Chest: Effort normal and breath sounds normal. She has no wheezes. She has no rales.  Skin: Skin is warm and dry.          Assessment & Plan:

## 2017-11-01 ENCOUNTER — Ambulatory Visit: Payer: 59 | Admitting: Primary Care

## 2017-11-02 ENCOUNTER — Telehealth: Payer: Self-pay | Admitting: Primary Care

## 2017-11-02 ENCOUNTER — Encounter: Payer: Self-pay | Admitting: Primary Care

## 2017-11-02 DIAGNOSIS — J470 Bronchiectasis with acute lower respiratory infection: Principal | ICD-10-CM

## 2017-11-02 NOTE — Telephone Encounter (Signed)
[employer] faxed fmla paperwork In kate's in box

## 2017-11-03 DIAGNOSIS — J470 Bronchiectasis with acute lower respiratory infection: Principal | ICD-10-CM

## 2017-11-03 NOTE — Telephone Encounter (Signed)
Completed and placed on [facility] desk. 

## 2017-11-05 DIAGNOSIS — J470 Bronchiectasis with acute lower respiratory infection: Principal | ICD-10-CM

## 2017-11-07 MED ORDER — ALBUTEROL SULFATE HFA 108 (90 BASE) MCG/ACT IN AERS
2.00 | INHALATION_SPRAY | RESPIRATORY_TRACT | Status: DC
Start: ? — End: 2017-11-07

## 2017-11-07 MED ORDER — SENNOSIDES 8.6 MG PO TABS
2.00 | ORAL_TABLET | ORAL | Status: DC
Start: 2017-11-07 — End: 2017-11-07

## 2017-11-07 MED ORDER — SODIUM CHLORIDE 3 % IN NEBU
4.00 | INHALATION_SOLUTION | RESPIRATORY_TRACT | Status: DC
Start: 2017-11-07 — End: 2017-11-07

## 2017-11-07 MED ORDER — FLUTICASONE PROPIONATE HFA 44 MCG/ACT IN AERO
2.00 | INHALATION_SPRAY | RESPIRATORY_TRACT | Status: DC
Start: 2017-11-30 — End: 2017-11-07

## 2017-11-07 MED ORDER — SODIUM CHLORIDE 0.9 % IJ SOLN
10.00 | INTRAMUSCULAR | Status: DC
Start: 2017-11-17 — End: 2017-11-07

## 2017-11-07 MED ORDER — HYDROCODONE-ACETAMINOPHEN 5-325 MG PO TABS
1.00 | ORAL_TABLET | ORAL | Status: DC
Start: ? — End: 2017-11-07

## 2017-11-07 MED ORDER — NICOTINE POLACRILEX 4 MG MT LOZG
4.00 mg | LOZENGE | OROMUCOSAL | Status: DC
Start: ? — End: 2017-11-07

## 2017-11-07 MED ORDER — ALPRAZOLAM 0.5 MG PO TABS
.25 mg | ORAL_TABLET | ORAL | Status: DC
Start: ? — End: 2017-11-07

## 2017-11-07 MED ORDER — ONDANSETRON HCL 8 MG PO TABS
4.00 mg | ORAL_TABLET | ORAL | Status: DC
Start: ? — End: 2017-11-07

## 2017-11-07 MED ORDER — POLYETHYLENE GLYCOL 3350 17 G PO PACK
17.00 | PACK | ORAL | Status: DC
Start: 2017-11-07 — End: 2017-11-07

## 2017-11-07 MED ORDER — TIZANIDINE HCL 4 MG PO TABS
2.00 mg | ORAL_TABLET | ORAL | Status: DC
Start: ? — End: 2017-11-07

## 2017-11-07 MED ORDER — PANTOPRAZOLE SODIUM 40 MG PO TBEC
40.00 mg | DELAYED_RELEASE_TABLET | ORAL | Status: DC
Start: 2017-11-30 — End: 2017-11-07

## 2017-11-07 MED ORDER — ENOXAPARIN SODIUM 40 MG/0.4ML ~~LOC~~ SOLN
40.00 mg | SUBCUTANEOUS | Status: DC
Start: 2017-11-18 — End: 2017-11-07

## 2017-11-07 MED ORDER — GENERIC EXTERNAL MEDICATION
1.00 | Status: DC
Start: 2017-11-17 — End: 2017-11-07

## 2017-11-07 MED ORDER — GENERIC EXTERNAL MEDICATION
200.00 | Status: DC
Start: ? — End: 2017-11-07

## 2017-11-07 MED ORDER — ASPIRIN 81 MG PO CHEW
81.00 mg | CHEWABLE_TABLET | ORAL | Status: DC
Start: 2017-12-01 — End: 2017-11-07

## 2017-11-07 MED ORDER — ACETAMINOPHEN 325 MG PO TABS
650.00 mg | ORAL_TABLET | ORAL | Status: DC
Start: ? — End: 2017-11-07

## 2017-11-07 NOTE — Unmapped (Addendum)
To Do    Mallory [name]??is a 61 year old woman??with a PMH of COPD, bronchiectasis, and M kansasii infection s/p resection, RU lobectomy and superior RLL??segmentectomy (2013) who presented to [facility] on 10/31/2017 with fever??and cough, found to have RML consolidation, concerning for pneumonia. No definitive organism has been identified, and she continues to be febrile despite anti-bacterial and anti-fungal agents.  ??  Persistent fever, in the setting of persistent RML and new RLL pneumonia  Received multiple rounds of PO antibiotics for presumed pneumonia with overall worsening symptoms 2 weeks prior to admission. CT Chest showed complete RML atelectasis and occluded RML airways compared to CT in September. 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 Rigid bronchoscopy 11/30 performed without fistula visualized. Initially started on IV Unasyn (11/27 - 12/2), then switched to IV meropenem (12/2 - 12/17). Continued to fever every day despite being on IV antibiotics. Also received IV vancomycin for several doses in the setting of GPCs on Gram stain, but stopped after MRSA swab came back negative. Lower respiratory cultures have grown single colonies of Aspergillus, so she was started on voriconazole 200 mg BID for presumed fungal infection. CT chest on 12/13 demonstrated interval increase in patchy consolidations, GGO, and asymmetric interstitial opacities in the right lung base, as well as increase in upper RLL opacities. Underwent bronch w/ BAL on 12/15, but infectious workup has continued to be unrevealing. All fungal cultures and tests have been negative so far. Voriconazole was reduced to 100 mg BID, then held in the setting of visual symptoms. Continued airway clearance with HTS 3% nebs, Aerobika, and chest vest. Underwent a third bronch with endobronchial biopsy on 12/21.  ??  Elevated IgE  Almost 1000. No formal history of asthma.  - Aspergillus specific precipitins  - comprehensive environmental panel  ??  Thrombocytosis  Up to the 900s, then has come down and been stable in the 800s. Hematopathology review of smear was unrevealing except for the number of platelets.  ??  Chronic post-thoracotomy pain syndrome  Chronic pain was consulted and provided recommendations.  - Tylenol 1000 mg q6h PRN  - oxycodone 5-10 mg q6h PRN  - tizanidine 2-4 mg TID PRN  - lidocaine patches  ??  Iron deficiency  Iron 17, transferrin 137.6, and ferritin only 180, so likely iron deficient. Received IV iron 125 mg daily for 5 days (12/7 - 12/11).  ??  History of M kansasii pulmonary cavitary lesion  Sputum AFB smears have been negative.

## 2017-11-08 NOTE — Telephone Encounter (Signed)
Paperwork faxed 12/3 Left message letting pt know paperwork has been faxed Copy for file Copy for scan Copy for pt

## 2017-11-11 NOTE — Unmapped (Signed)
Pharmacy Note: vancomycin dosing/monitoring   Mallory [name] female. 61 y.o.     Admission: 10/31/2017    Empiric PNA   goal trough: 15-8mcg/mL    12/5 resp cult (P)   OPF + GPC    Vancomycin started in setting of (+) sputum (GPC) and spiking fever.    Patient parameters:   HT: 5'6  WT: 47.7kg  Bun/scr; 3/0.47 (used scr ~1 given so low and age)    Calculated ke: 0.071  T1/2   Vd; ~34L    Vanc 1g q12h initiated  Estimated trough'  ~ 32mcg/mL    Plan   1. Check vanc trough in 24-48h   2. Continue to trend bun/scr    Pharmacy to follow along with medical team    Jazzmyn Filion, PharmD  [facility] Team Pharmacist

## 2017-11-12 DIAGNOSIS — J470 Bronchiectasis with acute lower respiratory infection: Principal | ICD-10-CM

## 2017-11-14 MED ORDER — SODIUM CHLORIDE 3 % IN NEBU
4.00 | INHALATION_SOLUTION | RESPIRATORY_TRACT | Status: DC
Start: 2017-11-30 — End: 2017-11-14

## 2017-11-14 MED ORDER — ACETAMINOPHEN 500 MG PO TABS
1000.00 mg | ORAL_TABLET | ORAL | Status: DC
Start: ? — End: 2017-11-14

## 2017-11-14 MED ORDER — GENERIC EXTERNAL MEDICATION
Status: DC
Start: ? — End: 2017-11-14

## 2017-11-14 MED ORDER — LIDOCAINE 5 % EX PTCH
3.00 | MEDICATED_PATCH | CUTANEOUS | Status: DC
Start: 2017-11-18 — End: 2017-11-14

## 2017-11-14 NOTE — Telephone Encounter (Signed)
I attempted to send you the letter to send to [employer]. Can you fax to [phone] when you get a chance? Thanks!

## 2017-11-15 LAB — HEPATIC FUNCTION PANEL
ALBUMIN: 3.3 g/dL — ABNORMAL LOW (ref 3.5–5.0)
ALKALINE PHOSPHATASE: 80 U/L (ref 38–126)
AST (SGOT): 15 U/L (ref 14–38)
BILIRUBIN TOTAL: 0.6 mg/dL (ref 0.0–1.2)
PROTEIN TOTAL: 6.2 g/dL — ABNORMAL LOW (ref 6.5–8.3)

## 2017-11-15 LAB — BASIC METABOLIC PANEL
ANION GAP: 10 mmol/L (ref 9–15)
BLOOD UREA NITROGEN: 7 mg/dL (ref 7–21)
BUN / CREAT RATIO: 14
CALCIUM: 9.1 mg/dL (ref 8.5–10.2)
CHLORIDE: 100 mmol/L (ref 98–107)
CO2: 28 mmol/L (ref 22.0–30.0)
CREATININE: 0.49 mg/dL — ABNORMAL LOW (ref 0.60–1.00)
EGFR MDRD AF AMER: >=60 mL/min/{1.73_m2} (ref >=60)
EGFR MDRD NON AF AMER: >=60 mL/min/{1.73_m2} (ref >=60)
GLUCOSE RANDOM: 91 mg/dL (ref 65–179)
POTASSIUM: 4.3 mmol/L (ref 3.5–5.0)

## 2017-11-15 LAB — CBC W/ AUTO DIFF
EOSINOPHILS ABSOLUTE COUNT: 0.3 10*9/L (ref 0.0–0.4)
HEMOGLOBIN: 9.4 g/dL — ABNORMAL LOW (ref 12.0–16.0)
LARGE UNSTAINED CELLS: 2 % (ref 0–4)
LYMPHOCYTES ABSOLUTE COUNT: 1.2 10*9/L — ABNORMAL LOW (ref 1.5–5.0)
MEAN CORPUSCULAR HEMOGLOBIN CONC: 31.8 g/dL (ref 31.0–37.0)
MEAN CORPUSCULAR HEMOGLOBIN: 31 pg (ref 26.0–34.0)
MEAN CORPUSCULAR VOLUME: 97.4 fL (ref 80.0–100.0)
MEAN PLATELET VOLUME: 7.5 fL (ref 7.0–10.0)
NEUTROPHILS ABSOLUTE COUNT: 12.4 10*9/L — ABNORMAL HIGH (ref 2.0–7.5)
PLATELET COUNT: 887 10*9/L — ABNORMAL HIGH (ref 150–440)
RED BLOOD CELL COUNT: 3.03 10*12/L — ABNORMAL LOW (ref 4.00–5.20)
RED CELL DISTRIBUTION WIDTH: 14.2 % (ref 12.0–15.0)
WBC ADJUSTED: 15.1 10*9/L — ABNORMAL HIGH (ref 4.5–11.0)

## 2017-11-15 LAB — MAGNESIUM: Magnesium:MCnc:Pt:Ser/Plas:Qn:: 1.8

## 2017-11-15 LAB — ALKALINE PHOSPHATASE: Alkaline phosphatase:CCnc:Pt:Ser/Plas:Qn:: 80

## 2017-11-15 LAB — PHOSPHORUS: Phosphate:MCnc:Pt:Ser/Plas:Qn:: 4.1

## 2017-11-15 LAB — NEUTROPHILS ABSOLUTE COUNT: Lab: 12.4 — ABNORMAL HIGH

## 2017-11-15 LAB — BUN / CREAT RATIO: Urea nitrogen/Creatinine:MRto:Pt:Ser/Plas:Qn:: 14

## 2017-11-15 MED ORDER — VORICONAZOLE 200 MG TABLET: 200 mg | tablet | Freq: Two times a day (BID) | 0 refills | 0 days | Status: AC

## 2017-11-15 MED ORDER — VORICONAZOLE 200 MG TABLET
ORAL_TABLET | Freq: Two times a day (BID) | ORAL | 0 refills | 0.00000 days | Status: CP
Start: 2017-11-15 — End: 2017-11-26

## 2017-11-15 NOTE — Unmapped (Signed)
Med G Daily Progress Note      Interval History:  Febrile up to 39.6 last night, tachycardic to 106, lower SpO2 than usual (91%). Received Tylenol. No other significant changes in how she's feeling with the exception of being fairly symptomatic when she's fevering.    Assessment/Plan:    Principal Problem:    Fever  Active Problems:    Tobacco use disorder    Bronchiectasis without complication (RAF-HCC)    History of surgical procedure    COPD (chronic obstructive pulmonary disease) (CMS-HCC)    Post-thoracotomy pain syndrome    Cough    History of Mycobacterium kansasii infection  Resolved Problems:    * No resolved hospital problems. *    Mallory [name] is a 61 year old woman with a PMH of COPD, bronchiectasis, and M kansasii infection s/p resection, RULectomy and superior RLL segmentectomy (2013) who presented to [facility] on 10/31/2017 with fever and cough, found to have RML consolidation, concerning for infection. 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  ??  RML consolidation  Rigid bronch 11/30 performed without fistula visualized (though cannot rule out microscopic fistula). Repeat CT chest 12/2 revealed increased consolidations in upper portion of RLL and opacities concerning for infection, unchanged complete RML opacification and occluded RML airways. 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 Continues to fever to above 39 every night despite being on IV meropenem. Lower respiratory cultures have grown single colonies of Aspergillus, which were not thought to be significant, and galactomannan was negative. However, because of her persistent fevers, we will empirically cover for fungal infections with voriconazole. Also considering other infections, including Nocardia, Actinomyces, and non-tuberculous Mycobacteria, although AFB smears and cultures from earlier in the admission have been negative.  - IV meropenem for 6 week course (12/2 - 1/16)  - start PO voriconazole 200 mg BID (12/12 - )  - follow up fungitell assay  - CT chest before completion of antibiotics  - airway clearance: HTS 3% nebs, Aerobika, and vest    Thrombocytosis  Platelets elevated in the 800s - 900s. May simply be an acute phase reactant. Low suspicion for malignancy given normal MVP.  - daily CBC    Chronic post-thoracotomy pain syndrome  Chronic pain was consulted and provided recommendations.  - Tylenol 1000 mg q6h PRN  - oxycodone 5-10 mg q6h PRN  - tizanidine 2-4 mg TID PRN  - lidocaine patches    Iron deficiency  Iron 17, transferrin 137.6, and ferritin only 180, so likely iron deficient. Received IV iron 125 mg daily for 5 days (12/7 - 12/11).    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  COPD  - albuterol inhaler PRN  - Flovent  ??  Tobacco dependence  - nicotine lozenges 4mg  q1hr prn, will also order on discharge    FEN/GI/DVT  - GI: home PPI  - DVT: Lovenox 40 mg daily  ??  Code status: DNR and DNI; confirmed with patient on admission    Patient has Advanced Directive with [name]  Healthcare POA: [name] [phone])     Dispo: Med G, floor; D/C 12/10  ___________________________________________________________________      Labs/Studies:  Labs and Studies from the last 24hrs per EMR and Reviewed    Objective:  Temp:  [36.2 ??C-39.6 ??C] 38.2 ??C  Heart Rate:  [89-116] 103  Resp:  [18-20] 18  BP: (100-118)/(58-78) 103/59  SpO2:  [93 %-96 %] 96 %,   No intake or output data in the 24 hours ending 11/15/17 1303     Gen: sitting up in bed, NAD  CV: tachycardic and regualr, no murmurs  Pulm: clear on the left, diminished on the right, normal WOB on RA, no wheezes  Abd: soft, NT, ND  Extr: WWP, no tenderness of edema of lower extremities

## 2017-11-15 NOTE — Unmapped (Signed)
Med G Daily Progress Note      Interval History:  Febrile 12/11 to 38.4 and then to 39.3C, resolved with TYLENOL. Reports less right chest pain than five days prior. No BM x 2 days.     Assessment/Plan:    Principal Problem:    Fever  Active Problems:    Tobacco use disorder    Bronchiectasis without complication (RAF-HCC)    History of surgical procedure    COPD (chronic obstructive pulmonary disease) (CMS-HCC)    Post-thoracotomy pain syndrome    Cough    History of Mycobacterium kansasii infection  Resolved Problems:    * No resolved hospital problems. *    Mallory [name] is a 61 year old woman with a PMH of COPD, bronchiectasis, and M kansasii infection s/p resection, RULectomy and superior RLL segmentectomy (2013) who presented to [facility] on 10/31/2017 with fever and cough.  ??  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 Repeat CT chest 12/2 revealed increased consolidations in upper portion of RLL and opacities c/f infection, unchanged complete RML opacification and occluded RML airways. Stable large cystic R apical opacity c/f chronic bronchopleural fistula. Talked to Dr. Lavinia Sharps at [facility] about management of bronchopleural fistula, but likely will not pursue surgical intervention. Fevered to 39.1 on 12/6, so blood cultures and lower respiratory cultures collected. Respiratory Gram stain shows 2+ GPCs, but grew OP flora. MRSA swab was negative, so vancomycin was discontinued. Febrile to 38.3 yesterday and overnight. Repeat CXR had no interval changes. Not covering for fungal infections or atypical infections.  - IV meropenem for 6 week course (12/2 - 1/16)  [ ]  f/u fungitell and galactomannan  - CT chest before completion of antibiotics  - airway clearance: HTS 3% nebs, Aerobika, and vest    Thrombocytosis  Platelets elevated in the 800s - 900s. May simply be an acute phase reactant. Low suspicion for malignancy given normal MVP, but will order a blood smear.  [ ]  f/u smear review (discussed with Hem[facility] 11/14/17)    Chronic postthoracotomy pain syndrome  Pain is limiting her airway clearance. Chronic pain was consulted.  - Tylenol 1000 mg q6h PRN  - oxycodone 5-10 mg q6h PRN  - tizanidine 2-4 mg TID PRN  - lidocaine patches    Iron deficiency  Iron 17, transferrin 137.6, and ferritin only 180, so likely iron deficient.  - IV iron 125 mg daily for 5 days (12/7 - 12/11)    History of M kansasii pulmonary cavitary lesion  - Sputum AFB smears 3/3 negative  ??  COPD  - albuterol inhaler PRN  - Flovent  ??  Tobacco dependence  - nicotine lozenges 4mg  q1hr prn, will also order on discharge    FEN/GI/DVT  - GI: home PPI  - DVT: Lovenox 40 mg daily  ??  Code status: DNR and DNI; confirmed with patient on admission    Patient has Advanced Directive with [name]  Healthcare POA: Mallory [name] [phone])     Dispo: Med G, floor; D/C 12/10  ___________________________________________________________________      Labs/Studies:  Labs and Studies from the last 24hrs per EMR and Reviewed    Objective:  Temp:  [36.3 ??C-39.3 ??C] 37.7 ??C  Heart Rate:  [90-116] 91  Resp:  [16-18] 18  BP: (102-118)/(59-75) 109/62  SpO2:  [94 %-98 %] 95 %,     Intake/Output Summary (Last 24 hours) at 11/14/17 2024  Last data  filed at 11/14/17 0616   Gross per 24 hour   Intake              350 ml   Output                0 ml   Net              350 ml     Gen: sitting up in bed, NAD  CV: RRR, no m/r/g  Pulm: clear on the left, diminished on the right, normal WOB on RA, no wheezes  Abd: soft, NT, ND  Extr: WWP, no tenderness of edema of lower extremities

## 2017-11-15 NOTE — Unmapped (Signed)
Patient in no distress. States only uses inhaler at night. No airway clearance. Tolerated well

## 2017-11-15 NOTE — Unmapped (Signed)
[facility] Specialty Medication Referral: No PA required    Medication (Brand/Generic): Voriconazole    Initial FSI Test Claim completed with resulted information below:  No PA required  Patient ABLE to fill at [facility]  Insurance Company:  [employer]  Anticipated Copay: $0 for a 30 day supply    As Co-pay is under $100 defined limit, per policy there will be no further investigation of need for financial assistance at this time unless patient requests. This referral has been communicated to the provider and handed off to the [facility] team for further processing and filling of prescribed medication.   ______________________________________________________________________  Please utilize this referral for viewing purposes as it will serve as the central location for all relevant documentation and updates.

## 2017-11-15 NOTE — Unmapped (Addendum)
Problem: Patient Care Overview  Goal: Plan of Care Review  Outcome: Progressing   11/15/17 0500   OTHER   Plan of Care Reviewed With patient   Plan of Care Review   Progress improving       Pt alert and oriented x4.  Pt on RA.  Febrile with 39.3.  MD notified.  Resolved with PRN tylenol.  Temp 37.3.  Pt c/o pain at rt ribcage.  Gave PRN oxy for relief.  Pt stated effective pain control.  Remains free from fall.  Continue to monitor.      Problem: Infection, Risk/Actual (Adult)  Goal: Infection Prevention/Resolution  Patient will demonstrate the desired outcomes by discharge/transition of care.   Outcome: Progressing      Problem: VTE, DVT and PE (Adult)  Goal: Signs and Symptoms of Listed Potential Problems Will be Absent, Minimized or Managed (VTE, DVT and PE)  Signs and symptoms of listed potential problems will be absent, minimized or managed by discharge/transition of care (reference VTE, DVT and PE (Adult) CPG).   Outcome: Progressing      Problem: Breathing Pattern Ineffective (Adult)  Goal: Effective Oxygenation/Ventilation  Patient will demonstrate the desired outcomes by discharge/transition of care.   Outcome: Progressing

## 2017-11-16 ENCOUNTER — Other Ambulatory Visit: Payer: Self-pay | Admitting: *Deleted

## 2017-11-16 DIAGNOSIS — J470 Bronchiectasis with acute lower respiratory infection: Principal | ICD-10-CM

## 2017-11-16 LAB — BASIC METABOLIC PANEL
ANION GAP: 8 mmol/L — ABNORMAL LOW (ref 9–15)
BLOOD UREA NITROGEN: 7 mg/dL (ref 7–21)
BUN / CREAT RATIO: 14
CALCIUM: 8.9 mg/dL (ref 8.5–10.2)
CO2: 28 mmol/L (ref 22.0–30.0)
CREATININE: 0.5 mg/dL — ABNORMAL LOW (ref 0.60–1.00)
EGFR MDRD AF AMER: >=60 mL/min/{1.73_m2} (ref >=60)
EGFR MDRD NON AF AMER: >=60 mL/min/{1.73_m2} (ref >=60)
GLUCOSE RANDOM: 117 mg/dL (ref 65–179)
POTASSIUM: 4.2 mmol/L (ref 3.5–5.0)
SODIUM: 132 mmol/L — ABNORMAL LOW (ref 135–145)

## 2017-11-16 LAB — CBC W/ AUTO DIFF
BASOPHILS ABSOLUTE COUNT: 0.1 10*9/L (ref 0.0–0.1)
EOSINOPHILS ABSOLUTE COUNT: 0.3 10*9/L (ref 0.0–0.4)
HEMATOCRIT: 26.8 % — ABNORMAL LOW (ref 36.0–46.0)
LARGE UNSTAINED CELLS: 3 % (ref 0–4)
LYMPHOCYTES ABSOLUTE COUNT: 1.3 10*9/L — ABNORMAL LOW (ref 1.5–5.0)
MEAN CORPUSCULAR HEMOGLOBIN CONC: 31.5 g/dL (ref 31.0–37.0)
MEAN CORPUSCULAR HEMOGLOBIN: 31.3 pg (ref 26.0–34.0)
MEAN CORPUSCULAR VOLUME: 99.3 fL (ref 80.0–100.0)
MEAN PLATELET VOLUME: 7.2 fL (ref 7.0–10.0)
MONOCYTES ABSOLUTE COUNT: 0.8 10*9/L (ref 0.2–0.8)
NEUTROPHILS ABSOLUTE COUNT: 9.8 10*9/L — ABNORMAL HIGH (ref 2.0–7.5)
PLATELET COUNT: 803 10*9/L — ABNORMAL HIGH (ref 150–440)
RED BLOOD CELL COUNT: 2.7 10*12/L — ABNORMAL LOW (ref 4.00–5.20)
RED CELL DISTRIBUTION WIDTH: 14.2 % (ref 12.0–15.0)

## 2017-11-16 LAB — FUNGITELL: Lab: <31

## 2017-11-16 LAB — BLOOD GAS, VENOUS
BASE EXCESS VENOUS: 3.8 — ABNORMAL HIGH (ref -2.0–2.0)
HCO3 VENOUS: 28 mmol/L — ABNORMAL HIGH (ref 22–27)
O2 SATURATION VENOUS: 53.3 % (ref 40.0–85.0)
PH VENOUS: 7.4 (ref 7.32–7.43)
PO2 VENOUS: 32 mmHg (ref 30–55)

## 2017-11-16 LAB — HYPOCHROMIA

## 2017-11-16 LAB — PH VENOUS: pH:LsCnc:Pt:BldV:Qn:: 7.4

## 2017-11-16 LAB — LACTATE BLOOD VENOUS: Lactate:SCnc:Pt:BldV:Qn:: 0.8

## 2017-11-16 LAB — EGFR MDRD NON AF AMER: Glomerular filtration rate/1.73 sq M.predicted.non black:ArVRat:Pt:Ser/Plas/Bld:Qn:Creatinine-based formula (MDRD): >=60

## 2017-11-16 NOTE — Telephone Encounter (Signed)
Letter was faxed as requested

## 2017-11-16 NOTE — Patient Outreach (Signed)
[facility]) Care Management  [id number]/15/[id number]  [name] [name] [birth year] [id number]   Subjective: Telephone call to patient's home  number, spoke with female answering the phone, states Ms. [name] [name] is currently in the hospital, left HIPAA compliant message, and requested call back.     Objective: Per KPN (Knowledge Performance Now, point of care tool) and chart review, patient admitted to [facility] on 10/31/17 and discharge date unknown for shortness of breath.  Patient has COLD (chronic obstructive lung disease), M kansasii status post resection, chronic bronchopulmonary fistula, Tobacco dependence, and COPD.        Assessment:   Received [facility] Transition of care referral on 11/06/17.  Transition of care follow up pending patient contact.     Plan:  RNCM will call patient for  telephone outreach attempt, transition of care follow up, within 3 business days of hospital discharge notification.    Trevone Prestwood H. Annia Friendly, BSN, [place] Management [facility] Telephonic CM Phone: [phone] Fax: [phone]

## 2017-11-16 NOTE — Unmapped (Signed)
Problem: Patient Care Overview  Goal: Plan of Care Review  Outcome: Progressing    Goal: Individualization and Mutuality  Outcome: Progressing    Goal: Discharge Needs Assessment  Outcome: Progressing    Goal: Interprofessional Rounds/Family Conf  Outcome: Progressing      Problem: Infection, Risk/Actual (Adult)  Goal: Infection Prevention/Resolution  Patient will demonstrate the desired outcomes by discharge/transition of care.   Outcome: Progressing      Problem: VTE, DVT and PE (Adult)  Goal: Signs and Symptoms of Listed Potential Problems Will be Absent, Minimized or Managed (VTE, DVT and PE)  Signs and symptoms of listed potential problems will be absent, minimized or managed by discharge/transition of care (reference VTE, DVT and PE (Adult) CPG).   Outcome: Progressing      Problem: Breathing Pattern Ineffective (Adult)  Goal: Effective Oxygenation/Ventilation  Patient will demonstrate the desired outcomes by discharge/transition of care.   Outcome: Progressing

## 2017-11-16 NOTE — Unmapped (Signed)
Problem: Patient Care Overview  Goal: Plan of Care Review  Outcome: Progressing   11/16/17 0423   OTHER   Plan of Care Reviewed With patient   Plan of Care Review   Progress improving     Pt A/Ox4.  Pt on RA.  Tolerated all meds per MAR.  Had a temp of 39.6.  Prn tylenol given.  Temp down to 37C.  Pt ambulated in room.   Pt given prn meds for anxiety and muscle spasms.  Continue POC.      Problem: Infection, Risk/Actual (Adult)  Goal: Infection Prevention/Resolution  Patient will demonstrate the desired outcomes by discharge/transition of care.   Outcome: Progressing      Problem: VTE, DVT and PE (Adult)  Goal: Signs and Symptoms of Listed Potential Problems Will be Absent, Minimized or Managed (VTE, DVT and PE)  Signs and symptoms of listed potential problems will be absent, minimized or managed by discharge/transition of care (reference VTE, DVT and PE (Adult) CPG).   Outcome: Progressing      Problem: Breathing Pattern Ineffective (Adult)  Goal: Effective Oxygenation/Ventilation  Patient will demonstrate the desired outcomes by discharge/transition of care.   Outcome: Progressing

## 2017-11-16 NOTE — Unmapped (Signed)
CT NOTES    ready- contact/droplet- for adenovirus  travel: w/c, no o2, medlocked IV, droplet/contact - not specified in epic from order placed on 11/06/17. chart also says adenovirus  1449: RN Asher Muir is not available to talk, RN to call back. TAL  1403 need travel info

## 2017-11-16 NOTE — Unmapped (Signed)
Patient refused 3% hts via svn and airway clearance.

## 2017-11-17 LAB — MAGNESIUM: Magnesium:MCnc:Pt:Ser/Plas:Qn:: 1.7

## 2017-11-17 LAB — BASIC METABOLIC PANEL
ANION GAP: 11 mmol/L (ref 9–15)
BLOOD UREA NITROGEN: 5 mg/dL — ABNORMAL LOW (ref 7–21)
BUN / CREAT RATIO: 10
CALCIUM: 8.4 mg/dL — ABNORMAL LOW (ref 8.5–10.2)
CO2: 29 mmol/L (ref 22.0–30.0)
CREATININE: 0.51 mg/dL — ABNORMAL LOW (ref 0.60–1.00)
EGFR MDRD AF AMER: >=60 mL/min/{1.73_m2} (ref >=60)
EGFR MDRD NON AF AMER: >=60 mL/min/{1.73_m2} (ref >=60)
GLUCOSE RANDOM: 107 mg/dL (ref 65–179)
POTASSIUM: 4.1 mmol/L (ref 3.5–5.0)
SODIUM: 138 mmol/L (ref 135–145)

## 2017-11-17 LAB — PHOSPHORUS: Phosphate:MCnc:Pt:Ser/Plas:Qn:: 4.6

## 2017-11-17 LAB — CBC W/ AUTO DIFF
BASOPHILS ABSOLUTE COUNT: 0 10*9/L (ref 0.0–0.1)
EOSINOPHILS ABSOLUTE COUNT: 0.1 10*9/L (ref 0.0–0.4)
HEMATOCRIT: 26.2 % — ABNORMAL LOW (ref 36.0–46.0)
HEMOGLOBIN: 8.2 g/dL — ABNORMAL LOW (ref 12.0–16.0)
LYMPHOCYTES ABSOLUTE COUNT: 1.2 10*9/L — ABNORMAL LOW (ref 1.5–5.0)
MEAN CORPUSCULAR HEMOGLOBIN CONC: 31.2 g/dL (ref 31.0–37.0)
MEAN CORPUSCULAR HEMOGLOBIN: 31.2 pg (ref 26.0–34.0)
MEAN CORPUSCULAR VOLUME: 99.9 fL (ref 80.0–100.0)
MEAN PLATELET VOLUME: 7.5 fL (ref 7.0–10.0)
MONOCYTES ABSOLUTE COUNT: 0.8 10*9/L (ref 0.2–0.8)
NEUTROPHILS ABSOLUTE COUNT: 10.4 10*9/L — ABNORMAL HIGH (ref 2.0–7.5)
PLATELET COUNT: 841 10*9/L — ABNORMAL HIGH (ref 150–440)
RED BLOOD CELL COUNT: 2.62 10*12/L — ABNORMAL LOW (ref 4.00–5.20)
RED CELL DISTRIBUTION WIDTH: 14.2 % (ref 12.0–15.0)
WBC ADJUSTED: 12.8 10*9/L — ABNORMAL HIGH (ref 4.5–11.0)

## 2017-11-17 LAB — EOSINOPHILS ABSOLUTE COUNT: Lab: 0.1

## 2017-11-17 LAB — CHLORIDE: Chloride:SCnc:Pt:Ser/Plas:Qn:: 98

## 2017-11-17 MED ORDER — GENERIC EXTERNAL MEDICATION
Status: DC
Start: ? — End: 2017-11-17

## 2017-11-17 MED ORDER — VORICONAZOLE 200 MG PO TABS
200.00 mg | ORAL_TABLET | ORAL | Status: DC
Start: 2017-11-17 — End: 2017-11-17

## 2017-11-17 MED ORDER — POLYETHYLENE GLYCOL 3350 17 G PO PACK
17.00 | PACK | ORAL | Status: DC
Start: ? — End: 2017-11-17

## 2017-11-17 NOTE — Unmapped (Signed)
Med G Daily Progress Note      Interval History:  Febrile up to 39.6 last night, hypotensive to 80s systolic. Received Tylenol and 500 mL bolus. No other significant changes in how she's feeling with the exception of being fairly symptomatic when she's fevering.    Assessment/Plan:    Principal Problem:    Fever  Active Problems:    Tobacco use disorder    Bronchiectasis without complication (RAF-HCC)    History of surgical procedure    COPD (chronic obstructive pulmonary disease) (CMS-HCC)    Post-thoracotomy pain syndrome    Cough    History of Mycobacterium kansasii infection  Resolved Problems:    * No resolved hospital problems. *    Mallory [name] is a 61 year old woman with a PMH of COPD, bronchiectasis, and M kansasii infection s/p resection, RULectomy and superior RLL segmentectomy (2013) who presented to [facility] on 10/31/2017 with fever and cough, found to have RML consolidation, concerning for infection. 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  ??  RML consolidation  Rigid bronch 11/30 performed without fistula visualized (though cannot rule out microscopic fistula). Repeat CT chest 12/2 revealed increased consolidations in upper portion of RLL and opacities concerning for infection, unchanged complete RML opacification and occluded RML airways. 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 Continues to fever to above 39 every night despite being on IV meropenem. Lower respiratory cultures have grown single colonies of Aspergillus, which were not thought to be significant, and galactomannan was negative. However, because of her persistent fevers, we will empirically cover for fungal infections with voriconazole. Also considering other infections, including Nocardia, Actinomyces, and non-tuberculous Mycobacteria, although AFB smears and cultures from earlier in the admission have been negative.  - IV meropenem for 6 week course (12/2 - 1/16)  - start PO voriconazole 200 mg BID (12/12 - )  - CT chest w/o contrast  - follow up fungitell assay  - airway clearance: HTS 3% nebs, Aerobika, and vest    Thrombocytosis  Platelets elevated in the 800s - 900s. May simply be an acute phase reactant. Low suspicion for malignancy given normal MVP.  - daily CBC    Chronic post-thoracotomy pain syndrome  Chronic pain was consulted and provided recommendations.  - Tylenol 1000 mg q6h PRN  - oxycodone 5-10 mg q6h PRN  - tizanidine 2-4 mg TID PRN  - lidocaine patches    Iron deficiency  Iron 17, transferrin 137.6, and ferritin only 180, so likely iron deficient. Received IV iron 125 mg daily for 5 days (12/7 - 12/11).    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  COPD  - albuterol inhaler PRN  - Flovent  ??  Tobacco dependence  - nicotine lozenges 4mg  q1hr prn, will also order on discharge    FEN/GI/DVT  - GI: home PPI  - DVT: Lovenox 40 mg daily  ??  Code status: DNR and DNI; confirmed with patient on admission    Patient has Advanced Directive with [name]  Healthcare POA: [name] [phone])     Dispo: Med G, floor; D/C 12/10  ___________________________________________________________________      Labs/Studies:  Labs and Studies from the last 24hrs per EMR and Reviewed    Objective:  Temp:  [36.6 ??C-39.3 ??C] 38.3 ??C  Heart  Rate:  [62-106] 62  Resp:  [20-24] 20  BP: (72-117)/(42-73) 97/52  SpO2:  [93 %-100 %] 93 %,     Intake/Output Summary (Last 24 hours) at 11/16/17 1634  Last data filed at 11/16/17 0600   Gross per 24 hour   Intake              600 ml   Output                0 ml   Net              600 ml        Gen: sitting up in bed, NAD  CV: tachycardic and regualr, no murmurs  Pulm: clear on the left, diminished on the right, normal WOB on RA, no wheezes  Abd: soft, NT, ND  Extr: WWP, no tenderness of edema of lower extremities

## 2017-11-17 NOTE — Unmapped (Signed)
I was just up seeing Mallory [name] in re: to getting a vest as she has many concerns. One of them is cost and that is because she works for a [employer] business and she doesn???t want to be a burden in respects to having the insurance company pay a lot of money for one. She would like to do a 30 day trial to see if it does help her to produce sputum. She has been using our here and is finding that it is not really helping her or at least she thinks it not. The other concern was that if she does a 30 day trial and it starts this month, it works for her and she decides to go forward, will the insurance company be billed for the start of the trial in 2018 or would it be billed in 2019 when the trial is over. If it is billed in 2019, she does not want to trial it as she would have a deductible.

## 2017-11-17 NOTE — Unmapped (Signed)
Med G Daily Progress Note      Interval History:  Febrile to above 39 again last night, and does not recall what happened at that time. Says she woke up with oxygen, not knowing where it came from. Has also had some visual symptoms, like seeing sparkles on her phone, and her nurse appearing yellow.    Assessment/Plan:    Principal Problem:    Fever  Active Problems:    Tobacco use disorder    Bronchiectasis without complication (RAF-HCC)    History of surgical procedure    COPD (chronic obstructive pulmonary disease) (CMS-HCC)    Post-thoracotomy pain syndrome    Cough    History of Mycobacterium kansasii infection  Resolved Problems:    * No resolved hospital problems. *    Akera Snowberger is a 61 year old woman with a PMH of COPD, bronchiectasis, and M kansasii infection s/p resection, RULectomy and superior RLL segmentectomy (2013) who presented to [facility] on 10/31/2017 with fever and cough, found to have RML consolidation, concerning for infection. 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  ??  RML consolidation  Rigid bronch 11/30 performed without fistula visualized (though cannot rule out microscopic fistula). Repeat CT chest 12/2 revealed increased consolidations in upper portion of RLL and opacities concerning for infection, unchanged complete RML opacification and occluded RML airways. 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 Continues to fever to above 39 every night despite being on IV meropenem. Lower respiratory cultures have grown single colonies of Aspergillus, which were not thought to be significant, and galactomannan was negative. However, because of her persistent fevers, we will empirically cover for fungal infections with voriconazole. CT chest demonstrated interval increase in patchy consolidations, GGO, and asymmetric interstitial opacities in the right lung base, as well as increase in upper RLL opacities. Will likely need a bronch w/ BAL in order to get a better sample and determine which organism to treat for.  - IV meropenem for 6 week course (12/2 - 1/16)  - PO voriconazole 200 mg BID (12/12 - )  - follow up voriconazole levels given visual symptoms, will hold off on switching to posaconazole  - follow up fungitell assay  - bronch w/ BAL on 12/15, NPO at midnight  - airway clearance: HTS 3% nebs, Aerobika, and vest    Thrombocytosis  Platelets elevated in the 800s - 900s. May simply be an acute phase reactant. Low suspicion for malignancy given normal MVP.  - daily CBC    Chronic post-thoracotomy pain syndrome  Chronic pain was consulted and provided recommendations.  - Tylenol 1000 mg q6h PRN  - oxycodone 5-10 mg q6h PRN  - tizanidine 2-4 mg TID PRN  - lidocaine patches    Iron deficiency  Iron 17, transferrin 137.6, and ferritin only 180, so likely iron deficient. Received IV iron 125 mg daily for 5 days (12/7 - 12/11).    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  COPD  - albuterol inhaler PRN  - Flovent  ??  Tobacco dependence  - nicotine lozenges 4mg  q1hr prn, will also order on discharge    FEN/GI/DVT  - GI: home PPI  - DVT: Lovenox 40 mg daily  ??  Code status: DNR and DNI; confirmed with patient on admission    Patient has Advanced Directive  with [name]  Healthcare POA: [name] [phone])     Dispo: Med G, floor; D/C 12/10  ___________________________________________________________________      Labs/Studies:  Labs and Studies from the last 24hrs per EMR and Reviewed    Objective:  Temp:  [4.4 ??C-39.1 ??C] 36.5 ??C  Heart Rate:  [60-111] 82  Resp:  [18-22] 18  BP: (98-118)/(51-72) 102/72  SpO2:  [69 %-99 %] 99 %,     Intake/Output Summary (Last 24 hours) at 11/17/17 1419  Last data filed at 11/17/17 0700   Gross per 24 hour   Intake             1700 ml Output                0 ml   Net             1700 ml     Gen: sitting up in bed, NAD  CV: tachycardic and regualr, no murmurs  Pulm: clear on the left, diminished on the right, crackles in the right lower field  Abd: soft, NT, ND  Extr: WWP, no tenderness of edema of lower extremities

## 2017-11-17 NOTE — Unmapped (Signed)
GENERAL INFECTIOUS DISEASES TEAM B CONSULT NOTE      Mallory [name] is a 61 y.o. female  being seen in consultation at the request of Scott H. 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 AFB smears of multiple respiratory samples have been negatives, making it less likely that she has a mycobacterial infection.       ID Problem List:  1. 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 I agree with repeat bronch: I would send BAL for gram stain and aerobic/anaerobic cx, fungal cx, AFB smear and cx and  galactomannan  3. Follow up pending serum beta D glucan  4. I would continue the meropenem for now until the initial cx results from the repeat bronch are back  5. Of note, she has developed some visual alteration since started on voriconazole, which is a common side effect of voriconazole and does not warrant discontinuation. It often improves over time.      Thank you for involving [place] in the care of this patient. The General ID service - Team B will continue to follow.   Please call the General B ID pager with questions at [phone].     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  She says that in June 2018 she started having cough productive of yellow sputum and also had an episode of fever.  At baseline she has daily cough productive of clear sputum.  She was prescribed Augmentin for 6 weeks without much improvement of the cough.  Around Labor Day she had again fever and worsening cough and was prescribed again Augmentin for a few days.  She is then started using a nebulizer and her symptoms improved a lot and she had a month or so where she was doing fairly well until around Thanksgiving when she started again having intermittent fevers, cough productive of yellowish/greenish sputum and malaise.  She was prescribed a course of azithromycin without any improvement.  Because her symptoms persisted she was admitted on November 27.  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  She was initially started on Unasyn and then on December 2 she was changed to meropenem but she has continued having fevers despite being on  that.  A sputum culture from admission grew oropharyngeal flora.  A respiratory virus panel came back positive for adenovirus.  She underwent a bronchoscopy on November 30 and BAL culture grew Aspergillus fumigatus.  Sputum culture from December 10 is also growing mold.  Multiple AFB smears of both sputum and BAL have been negative.  Repeat CAT scan of the chest done yesterday showed increase in patchy consolidations in the right lung base.  Because of the worsening CAT scan findings and the positive respiratory culture for Aspergillus she was started on voriconazole yesterday.  She will have a repeat bronchoscopy tomorrow.  Otherwise, she has lost about 5 pounds.  She has not had hemoptysis other than a few streaks of blood in her sputum at times.  No history of exposure to tuberculosis.  She was born and raised in [place] and then moved for about 20 years in [place] before moving to [place]. She does not take systemic steroids frequently.      Source of information includes:  Review of medical records, discussion with patient, discussion with treating providers.      Allergies:  Diphenhydramine; Pseudoephedrine hcl; Levofloxacin; Alendronate sodium; Diphenhydramine hcl; Pheniramine; Pheniramine maleate; Pseudoephedrine; Tolmetin; and Codeine-guaifenesin      Medications:   Current antimicrobials:  Meropenem (12/2-)  Voriconazole (12/12-)    Previous antimicrobials:  Unasyn    Other medications reviewed.       Medical History:  Past Medical History:   Diagnosis Date   ??? Anxiety 10/17/2013   ??? Asthma 12/30/2006    Overview:  Qualifier: Diagnosis of  By: [name], [name].   Last Assessment & Plan:  Asx, lung exam today ok, cutting down tobacco    ??? Bronchiectasis (CMS-HCC) 12/24/2007    Overview:  Followed in Pulmonary clinic/ [facility]/ [facility]     - See CT 11/10/2005 RLL changes    - New changes RUL CT Chest  12/29/2011 ? All related to bronchiectasis >FOB 02/15/12 > neg cyt/ neg tbbx/ no wbc's on gm stain ? Afb/fungal studies done as requested    - 02/29/12 Rec PET Scan > pt declined > 03/05/2012 rec t surgery eval for RUL obectomy done 11/06/12 > Pos M [place]ii> f/u ID     - a   ??? Dislocation of shoulder region 10/17/2013   ??? Endometriosis 12/30/2006    Overview:  Qualifier: Diagnosis of  By: [name], Jose E.    ??? Gastroesophageal reflux disease 12/30/2006    Overview:  Qualifier: Diagnosis of  By: [name], [name].   Last Assessment & Plan:  Symptoms relatively well controlled with PPIs twice a day    ??? Hemoptysis    ??? Impaired glucose tolerance 04/02/2014    Last Assessment & Plan:  A1c 08-2013 was 5.9. Patient aware, recheck her A1c    ??? 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 s/p right partial lobectomy   ??? Insomnia 11/22/2010    Overview:  Qualifier: Diagnosis of  By: [name], [name].   Last Assessment & Plan:  Well-controlled with alprazolam    ??? Intertrochanteric fracture (CMS-HCC) 10/17/2013    Last Assessment & Plan:  Status post left hip injury  10/2013, doing outpatient PT.    ??? Lichen planus 12/30/2006    Overview:  Annotation: s/p mouth BX Qualifier: Diagnosis of  By: [name], Jose E.    ??? Liver cyst 12/24/2007   ??? Osteoarthritis 12/30/2006  Overview:  Qualifier: Diagnosis of  By: [name], [name].   Last Assessment & Plan:  On hydrocodone, needs a refill. UDS low 08-2013    ??? Osteoporosis 12/30/2006    Overview:  Dx at age 5, multiple DEXAs in the past at gyn, last DEXA? Intolerant to fosamax reclast was denied in the past   Last Assessment & Plan:  Not recent bone density test, intolerant to Fosamax, insurance denied  reclast  before. Plan: Bone density test,vit d, ca and vit po supplements     ??? Spontaneous pneumothorax     x2 in her 98s, had chest tube for 1   ??? Tobacco dependence syndrome 12/24/2007    Overview:  Limits of effective care disussed in pulmonary clinic/ [facility]/ 01/20/12  Last Assessment & Plan:  Today she is again counseled, I discussed Wellbutrin, Chantix. She reports that in the past she got very aggressive while taking Chantix    ??? Vascular insufficiency of intestine (CMS-HCC) 12/24/2007    Overview:  Qualifier: Diagnosis of  By: Koleen Distance CMA (AAMA), Leisha     ??? Vitamin B-complex deficiency 12/30/2006    Overview:  Mild on oral supplements   Last Assessment & Plan:  B 12 levels  consistently normal        Surgical History:  Past Surgical History:   Procedure Laterality Date   ??? BRONCHOSCOPY 02/15/12   ??? ESOPHAGOGASTRODUODENOSCOPY  12/03/12   ??? FLEXIBLE BRONCHOSCOPY  11/05/12   ??? KNEE SURGERY     ??? LUNG SURGERY  11/18/12    Right upper Lobe, Superior Segmentectomy   ??? MEDIASTINOSCOPY  05/25/12   ??? OVARIAN CYST REMOVAL     ??? PR BRONCHOSCOPY,DIAGNOSTIC N/A 11/03/2017    Procedure: Bronchoscopy, Rigid Or Flexible, W/Wo Fluoroscopic Guidance; Diagnostic, With Cell Washing, When Performed;  Surgeon: Sherwood Gambler, MD;  Location: MAIN OR [facility];  Service: Pulmonary   ??? THORACOTOMY Right 11/05/12   ??? TONSILLECTOMY         Social History:  Social History     Social History   ??? Marital status: Single     Spouse name: N/A   ??? Number of children: N/A   ??? Years of education: N/A     Occupational History   ??? Not on file.     Social History Main Topics   ??? Smoking status: Current Every Day Smoker     Packs/day: 1.00     Types: Cigarettes   ??? Smokeless tobacco: Never Used   ??? Alcohol use 4.2 oz/week     7 Glasses of wine per week   ??? Drug use: No   ??? Sexual activity: Not on file     Other Topics Concern   ??? Not on file     Social History Narrative    Works in [employer] at [employer].  Previously worked in [employer] at [employer].       Family History:  Family History   Problem Relation Age of Onset   ??? Heart disease Maternal Grandfather    ??? Multiple sclerosis Sister    ??? Stroke Maternal Grandmother    ??? Cancer Paternal Grandmother    ??? Diabetes Mother    ??? Heart disease Mother    ??? Dementia Mother    ??? Cancer Father         sarcoma       Review of Systems:  12 systems reviewed and negative except as per HPI.  Objective:   Vital signs:  Temp:  [4.4 ??C (39.9 ??F)-39.1 ??C (102.4 ??F)] 37.4 ??C (99.3 ??F)  Heart Rate:  [60-111] 94  Resp:  [18-22] 18  BP: (98-118)/(51-72) 101/70  MAP (mmHg):  [66] 66  SpO2:  [69 %-99 %] 97 %    Physical exam   General: in no acute distress  HEENT: anicteric conjunctivae, moist mucous membranes, no oral lesions  NODES: no cervical or submandibular lymphadenopathy LUNGS: normal work of breathing, crackles in right base  HEART: regular rhythm, no murmur  ABDOMINAL: normal bowel sounds, soft, no tenderness  MSK: no edema, no joint effusions   SKIN: no rash or skin breakdown  NEUROLOGICAL: alert and oriented x3  PSYCH: interactive  LINES: appear non-infected      Labs:    Lab Results   Component Value Date    WBC 12.8 (H) 11/17/2017    NEUTROABS 10.4 (H) 11/17/2017    LYMPHSABS 1.2 (L) 11/17/2017    EOSABS 0.1 11/17/2017    HGB 8.2 (L) 11/17/2017    HCT 26.2 (L) 11/17/2017    PLT 841 (H) 11/17/2017       Lab Results   Component Value Date    NA 138 11/17/2017    K 4.1 11/17/2017    CL 98 11/17/2017    CO2 29.0 11/17/2017    BUN 5 (L) 11/17/2017    CREATININE 0.51 (L) 11/17/2017    CALCIUM 8.4 (L) 11/17/2017    MG 1.7 11/17/2017    PHOS 4.6 11/17/2017       Lab Results   Component Value Date    ALKPHOS 80 11/15/2017    BILITOT 0.6 11/15/2017    BILIDIR 0.30 11/15/2017    PROT 6.2 (L) 11/15/2017    ALBUMIN 3.3 (L) 11/15/2017    ALT 27 11/15/2017    AST 15 11/15/2017       Lab Results   Component Value Date    CRP 170.2 (H) 11/14/2017         IHC fungal markers and viral loads:  Lab Results   Component Value Date    FUNGITELL <31 11/14/2017    GALAC <0.5 11/14/2017         Microbiology:    Microbiology Results (last day)     Procedure Component Value Date/Time Date/Time    Legionella Culture [[id number]]  (Abnormal) Collected:  11/03/17 1152    Lab Status:  Final result Specimen:  Washing, Bronchial from Lung, Combined Updated:  11/17/17 1430     Legionella Culture Aspergillus fumigatus (A)    Narrative:       Specimen Source: Lung, Combined    Fungal Culture [[id number]] Collected:  11/03/17 1152    Lab Status:  Preliminary result Specimen:  Washing, Bronchial from Lung, Combined Updated:  11/17/17 1128     Fungal Pathogen Screen NEGATIVE TO DATE     Fungus Stain NO FUNGI SEEN    Narrative:       Specimen Source: Lung, Combined    Blood Culture, Adult [[id number]]  (Normal) Collected:  11/16/17 0251    Lab Status:  Preliminary result Specimen:  Blood from PICC Line Updated:  11/17/17 0300     Blood Culture, Routine No Growth at 24 hours    Blood Culture [[id number]]  (Normal) Collected:  11/13/17 0052    Lab Status:  Preliminary result Specimen:  Blood from PICC Line Updated:  11/17/17 0130     Blood Culture, Routine No Growth at 4 days  Blood Culture [[id number]]  (Normal) Collected:  11/13/17 0052    Lab Status:  Preliminary result Specimen:  Blood from Peripheral Updated:  11/17/17 0130     Blood Culture, Routine No Growth at 4 days            Studies:   Ct Chest Wo Contrast    Result Date: 11/16/2017  EXAM: CT CHEST WO CONTRAST DATE: 11/16/2017 4:03 PM ACCESSION: [id number] UN DICTATED: 11/16/2017 4:08 PM INTERPRETATION LOCATION: [facility]     CLINICAL INDICATION: 61 years old Female with Other-follow-up chronic pneumonia-      COMPARISON: Chest CT 11/05/2017 TECHNIQUE: A spiral CT scan was obtained without IV contrast from the thoracic inlet through the hemidiaphragms. Images were reconstructed in the axial plane.  Coronal and sagittal reformatted images of the chest were also provided for further evaluation of the lung parenchyma.     FINDINGS: Left PICC terminates in right atrium.     AIRWAYS, LUNGS, PLEURA: Emphysematous changes. Post right upper lobectomy. Right apical thick-walled cystic lesion similar to prior. Slight interval worsening in consolidation of the superior aspect of the right lower lobe. Short interval increase in patchy airspace opacities with adjacent groundglass and asymmetric intralobular septal thickening in the base of the right lower lobe. New foci of endobronchial opacification in right lower lobe.     MEDIASTINUM: Normal heart size. No pericardial effusion. Normal caliber thoracic aorta. Aortic and coronary artery calcifications.     Right paratracheal lymphadenopathy, with increase in right superior paratracheal region, up to 1.1 cm, previously subcentimeter, (2:31.     IMAGED ABDOMEN: Unremarkable.     SOFT TISSUES: Unremarkable.     BONES: Unchanged chronic changes in the right lateral second and sixth ribs. 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          --Short interval increase in patchy consolidations, groundglass opacities, and asymmetric interstitial opacities in the right lung base could reflect worsening multifocal pneumonia, superimposed acute aspiration (particularly given new RLL endobronchial opacification), and/or asymmetric edema.     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 Attention on follow-up.     --Stable to slight increase in consolidative opacities in the upper portion of the right lower lobe.        Studies:   Xr Chest Portable    Result Date: 11/12/2017  EXAM: XR CHEST PORTABLE DATE: 11/12/2017 6:07 PM ACCESSION: [id number] UN DICTATED: 11/12/2017 6:29 PM INTERPRETATION LOCATION: [facility]     CLINICAL INDICATION: 61 years old Female with OTHER-infection in pleural space. Known cystic cavity (possible bronchopleural fistula)--      COMPARISON: 11/05/2017 and earlier     TECHNIQUE: Portable Chest Radiograph.     FINDINGS:     Unchanged positioning of the left-sided PICC line.     Sequelae of right upper lobe lobectomy. Unchanged cystic right apical opacity. Similar appearing opacity along the inferior margin. No new airspace opacities.     No pleural effusion or pneumothorax     Stable cardiomediastinal silhouette.                  No interval change.    Ct Chest Wo Contrast    Result Date: 11/16/2017  EXAM: CT CHEST WO CONTRAST DATE: 11/16/2017 4:03 PM ACCESSION: [id number] UN DICTATED: 11/16/2017 4:08 PM INTERPRETATION LOCATION: [facility]     CLINICAL INDICATION: 61 years old Female with Other-follow-up chronic pneumonia-      COMPARISON: Chest CT 11/05/2017 TECHNIQUE: A spiral CT scan  was obtained without IV contrast from the thoracic inlet through the hemidiaphragms. Images were reconstructed in the axial plane.  Coronal and sagittal reformatted images of the chest were also provided for further evaluation of the lung parenchyma.     FINDINGS: Left PICC terminates in right atrium.     AIRWAYS, LUNGS, PLEURA: Emphysematous changes. Post right upper lobectomy. Right apical thick-walled cystic lesion similar to prior. Slight interval worsening in consolidation of the superior aspect of the right lower lobe. Short interval increase in patchy airspace opacities with adjacent groundglass and asymmetric intralobular septal thickening in the base of the right lower lobe. New foci of endobronchial opacification in right lower lobe.     MEDIASTINUM: Normal heart size. No pericardial effusion. Normal caliber thoracic aorta. Aortic and coronary artery calcifications.     Right paratracheal lymphadenopathy, with increase in right superior paratracheal region, up to 1.1 cm, previously subcentimeter, (2:31.     IMAGED ABDOMEN: Unremarkable.     SOFT TISSUES: Unremarkable.     BONES: Unchanged chronic changes in the right lateral second and sixth ribs. 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          --Short interval increase in patchy consolidations, groundglass opacities, and asymmetric interstitial opacities in the right lung base could reflect worsening multifocal pneumonia, superimposed acute aspiration (particularly given new RLL endobronchial opacification), and/or asymmetric edema.     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 Attention on follow-up.     --Stable to slight increase in consolidative opacities in the upper portion of the right lower lobe.

## 2017-11-17 NOTE — Unmapped (Signed)
Problem: Patient Care Overview  Goal: Plan of Care Review  Outcome: Progressing   11/16/17 1709   OTHER   Plan of Care Reviewed With patient   Plan of Care Review   Progress improving     VS WNL.  No concerns noted.  Chest CT obtained this afternoon.  Patient febrile x 1 today that resolved with PRN tylenol.  No further concerns noted.  Continue POC.

## 2017-11-17 NOTE — Unmapped (Addendum)
Problem: Patient Care Overview  Goal: Plan of Care Review  Outcome: Progressing  Pain managed with PRN meds. See MAR. Sister at bedside today. Pt typically only eats large lunch and snack at dinner. Encouraged to maintain healthy diet. Broncoscopy planned for tomorrow. NPO at midnight tonight. Pt willing to take anticoag med today. But says she usually declines. RN encouraged her to discuss with her med team ambulation vs anti coagulation therapy since she is only ambulating in the room. Will continue POC.

## 2017-11-18 LAB — BASIC METABOLIC PANEL
ANION GAP: 11 mmol/L (ref 9–15)
BUN / CREAT RATIO: 10
CALCIUM: 8.9 mg/dL (ref 8.5–10.2)
CHLORIDE: 94 mmol/L — ABNORMAL LOW (ref 98–107)
CO2: 31 mmol/L — ABNORMAL HIGH (ref 22.0–30.0)
CREATININE: 0.5 mg/dL — ABNORMAL LOW (ref 0.60–1.00)
EGFR MDRD AF AMER: >=60 mL/min/{1.73_m2} (ref >=60)
EGFR MDRD NON AF AMER: >=60 mL/min/{1.73_m2} (ref >=60)
GLUCOSE RANDOM: 97 mg/dL (ref 65–99)
POTASSIUM: 4.5 mmol/L (ref 3.5–5.0)
SODIUM: 136 mmol/L (ref 135–145)

## 2017-11-18 LAB — CBC W/ AUTO DIFF
BASOPHILS ABSOLUTE COUNT: 0.1 10*9/L (ref 0.0–0.1)
EOSINOPHILS ABSOLUTE COUNT: 0.3 10*9/L (ref 0.0–0.4)
HEMATOCRIT: 25.1 % — ABNORMAL LOW (ref 36.0–46.0)
HEMOGLOBIN: 7.9 g/dL — ABNORMAL LOW (ref 12.0–16.0)
LARGE UNSTAINED CELLS: 3 % (ref 0–4)
LYMPHOCYTES ABSOLUTE COUNT: 1.4 10*9/L — ABNORMAL LOW (ref 1.5–5.0)
MEAN CORPUSCULAR HEMOGLOBIN CONC: 31.4 g/dL (ref 31.0–37.0)
MEAN CORPUSCULAR HEMOGLOBIN: 31.4 pg (ref 26.0–34.0)
MEAN CORPUSCULAR VOLUME: 99.9 fL (ref 80.0–100.0)
MEAN PLATELET VOLUME: 7.3 fL (ref 7.0–10.0)
NEUTROPHILS ABSOLUTE COUNT: 7.2 10*9/L (ref 2.0–7.5)
PLATELET COUNT: 793 10*9/L — ABNORMAL HIGH (ref 150–440)
RED BLOOD CELL COUNT: 2.51 10*12/L — ABNORMAL LOW (ref 4.00–5.20)
RED CELL DISTRIBUTION WIDTH: 14.2 % (ref 12.0–15.0)

## 2017-11-18 LAB — MONOCYTES ABSOLUTE COUNT: Lab: 0.7

## 2017-11-18 LAB — BUN / CREAT RATIO: Urea nitrogen/Creatinine:MRto:Pt:Ser/Plas:Qn:: 10

## 2017-11-18 LAB — MAGNESIUM
MAGNESIUM: 1.7 mg/dL (ref 1.6–2.2)
Magnesium:MCnc:Pt:Ser/Plas:Qn:: 1.7

## 2017-11-18 LAB — PHOSPHORUS: Phosphate:MCnc:Pt:Ser/Plas:Qn:: 3.8

## 2017-11-18 NOTE — Unmapped (Signed)
Inpatient Surgery Update      Patient Name: Mallory [name]  Patient DOB: [birth year]  Patient MRN: [id number]  Today's Date: 11/18/2017    Procedure:  Bronchoscopy with bronchoalveolar lavage under moderate sedation      Assessment/Plan:     Assessment:  Mallory [name] is a 61 y.o. female with a past medical history and current signs/symptoms as noted below here for Bronchoscopy with bronchoalveolar lavage under moderate sedation.      Plan:  I have explained the possible benefits and risks of the procedure and moderate sedation. Patient acknowledged understanding and is agreement to undergo procedure. All questions and concerns were addressed. Written consent was obtained with witness present and signed consent form was placed in the patient's chart.     The patient will accept blood products in an emergent situation. The patient does not have a Do Not Resuscitate order in effect    We will proceed with Bronchoscopy with bronchoalveolar lavage under moderate sedation as planned.    Sedation Plan: Monitoring per policy/protocol, Recovery per policy/protocol, Options and risks of sedation discussed with patient/family and Informed consent for procedure and sedation obtained      This patient was seen and discussed with attending physician, Dr. Jhonnie Garner. Lurena Nida, who agrees with the plan above.       Randa Ngo, MD   November 18, 2017       History of Present Illness:     Mallory [name] is a 61 y.o. female with a past medical history as below that is scheduled for Bronchoscopy with bronchoalveolar lavage under moderate sedation.    Presenting signs and symptoms include: cough, fevers, shortness of breath and abnormal CT scan.    NPO Status: > 6 hours    Allergies:   Allergies   Allergen Reactions   ??? Diphenhydramine Rash     Makes lungs bleed   ??? Pseudoephedrine Hcl Other (See Comments)     Has Ischemic Colitis. Makes colon bleed   ??? Levofloxacin Anxiety   ??? Alendronate Sodium Other (See Comments)     Heart burn REACTION: chest pain   ??? Diphenhydramine Hcl Other (See Comments)     REACTION: makes lungs bleed   ??? Pheniramine Other (See Comments)     REACTION: makes lungs bleed   ??? Pheniramine Maleate Other (See Comments)     REACTION: makes lungs bleed   ??? Pseudoephedrine Other (See Comments)     REACTION: Ischemic colitis   ??? Tolmetin Other (See Comments)     REACTION: ischemic colitis   ??? Codeine-Guaifenesin Itching and Other (See Comments)     Palms itching       Code Status: DNR and DNI - reversed during procedure    Medications:   Prior to Admission medications    Medication Sig Start Date End Date Taking? Authorizing Provider   ergocalciferol, vitamin D2, (VITAMIN D2 ORAL) Take 1 tablet by mouth daily with evening meal.   Yes Historical Provider, MD   ondansetron (ZOFRAN) 4 MG tablet Take 4 mg by mouth every eight (8) hours as needed for nausea.   Yes Historical Provider, MD   acetaminophen (TYLENOL) 500 MG tablet Take 500 mg by mouth every four (4) hours as needed. Moderate pain or fever    Historical Provider, MD   ALPRAZolam (XANAX) 0.5 MG tablet Take 0.5 mg by mouth nightly as needed. Take 0.25 to 0.5 mg qhs PRN 05/07/14   Historical Provider, MD   aspirin 81 MG chewable  tablet Chew 81 mg daily.    Historical Provider, MD   beclomethasone dipropionate (QVAR REDIHALER) 80 mcg/actuation inhaler Inhale 2 puffs. 04/24/17   Historical Provider, MD   benzonatate (TESSALON) 100 MG capsule Take 100-200 mg by mouth. 05/28/17   Historical Provider, MD   HYDROcodone-acetaminophen (NORCO) 7.5-325 mg per tablet Take 1 tablet by mouth every six (6) hours as needed. 04/26/17   Historical Provider, MD   lysine 1,000 mg Tab Take by mouth.    Historical Provider, MD   magnesium oxide (MAG-OX) 400 mg tablet Take 400 mg by mouth daily.     Historical Provider, MD   methocarbamol (ROBAXIN) 500 MG tablet Take 500 mg by mouth Three (3) times a day as needed.  01/02/15   Historical Provider, MD   nicotine polacrilex (NICORETTE) 4 MG lozenge Apply 4 mg to cheek.    Historical Provider, MD   pantoprazole (PROTONIX) 40 MG tablet Take 40 mg by mouth Two (2) times a day.    Historical Provider, MD   potassium gluconate 595 mg (99 mg) Tab Take 1 capsule by mouth daily.     Historical Provider, MD   sodium chloride 3 % nebulizer solution Inhale 4 mL by nebulization Two (2) times a day. Inhale 4 ml twice daily 10/25/16   Truett Mainland, MD   VENTOLIN HFA 90 mcg/actuation inhaler INHALE 2 PUFFS EVERY 4 HOURS AS NEEDED FOR WHEEZING OR SHORTNESS OF BREATH. 07/03/17   Truett Mainland, MD   voriconazole (VFEND) 200 MG tablet Take 1 tablet (200 mg total) by mouth every twelve (12) hours. 11/15/17 12/27/17  Lorenda Peck, MD     Current Facility-Administered Medications   Medication Dose Route Frequency Provider Last Rate Last Dose   ??? acetaminophen (TYLENOL) tablet 1,000 mg  1,000 mg Oral Q6H PRN Tiburcio Pea, MD   1,000 mg at 11/17/17 2200   ??? albuterol (PROVENTIL HFA;VENTOLIN HFA) 90 mcg/actuation inhaler 2 puff  2 puff Inhalation Q6H PRN Kai Levins, MD       ??? ALPRAZolam Prudy Feeler) tablet 0.25 mg  0.25 mg Oral Nightly PRN Kai Levins, MD   0.25 mg at 11/16/17 2202   ??? aspirin chewable tablet 81 mg  81 mg Oral Daily Kai Levins, MD   81 mg at 11/17/17 0955   ??? enoxaparin (LOVENOX) syringe 40 mg  40 mg Subcutaneous Q24H [facility] Kai Levins, MD   40 mg at 11/17/17 0954   ??? fluticasone (FLOVENT HFA) 44 mcg/actuation inhaler 2 puff  2 puff Inhalation Q12H (RT) Kai Levins, MD   2 puff at 11/17/17 2100   ??? heparin, porcine (PF) 100 unit/mL injection 200 Units  200 Units Intravenous Q8H PRN Adaline Sill, MD   200 Units at 11/18/17 5784   ??? ibuprofen (ADVIL,MOTRIN) tablet 600 mg  600 mg Oral Once Burman Riis Diddams, MD       ??? lidocaine (LIDODERM) 5 % patch 3 patch  3 patch Transdermal Daily Franco Nones, MD   1 patch at 11/17/17 0955   ??? meropenem (MERREM) 1 g in sodium chloride 0.9 % (NS) 100 mL IVPB-MBP  1 g Intravenous [facility] Kai Levins, MD 200 mL/hr at 11/18/17 0558 1 g at 11/18/17 0558   ??? nicotine polacrilex (NICORETTE) lozenge 4 mg  4 mg Buccal Q1H PRN Kai Levins, MD       ??? ondansetron (ZOFRAN) tablet 4 mg  4 mg Oral Q8H PRN Kai Levins, MD  4 mg at 11/18/17 0425   ??? oxyCODONE (ROXICODONE) immediate release tablet 5 mg  5 mg Oral Q6H PRN Lorenda Peck, MD   5 mg at 11/17/17 1610    Or   ??? oxyCODONE (ROXICODONE) immediate release tablet 10 mg  10 mg Oral Q6H PRN Lorenda Peck, MD   10 mg at 11/18/17 0425   ??? pantoprazole (PROTONIX) EC tablet 40 mg  40 mg Oral BID Kai Levins, MD   40 mg at 11/18/17 0557   ??? polyethylene glycol (MIRALAX) packet 17 g  17 g Oral BID PRN Maura Crandall, MD   17 g at 11/17/17 2120   ??? sodium chloride (NS) 0.9 % flush 10 mL  10 mL Intravenous BID Nyra Jabs, MD   10 mL at 11/17/17 2104   ??? sodium chloride 3 % nebulizer solution 4 mL  4 mL Nebulization 4x Daily (RT) Lorenda Peck, MD   4 mL at 11/17/17 2100   ??? tiZANidine (ZANAFLEX) tablet 2 mg  2 mg Oral TID PRN Lorenda Peck, MD   2 mg at 11/11/17 2122    Or   ??? tiZANidine (ZANAFLEX) tablet 4 mg  4 mg Oral TID PRN Lorenda Peck, MD   4 mg at 11/17/17 2121   ??? voriconazole (VFEND) tablet 200 mg  200 mg Oral Q12H [facility] Tiburcio Pea, MD   200 mg at 11/17/17 2104        Past Medical History:  Active Ambulatory Problems     Diagnosis Date Noted   ??? Tobacco use disorder 12/24/2007   ??? Gastroesophageal reflux disease 12/30/2006   ??? Vascular insufficiency of intestine (CMS-HCC) 12/24/2007   ??? Insomnia 11/22/2010   ??? Bronchiectasis without complication (RAF-HCC) 12/24/2007   ??? Anxiety 10/17/2013   ??? Palpitations 02/27/2009   ??? Dysphagia 12/03/2012   ??? Osteoporosis 12/30/2006   ??? Osteoarthritis 12/30/2006   ??? History of surgical procedure 11/18/2012   ??? Endometriosis 12/30/2006   ??? Disorder of liver 12/24/2007   ??? Lichen planus 12/30/2006   ??? Impaired glucose tolerance 04/02/2014   ??? Vitamin B-complex deficiency 12/30/2006   ??? Intertrochanteric fracture (CMS-HCC) 10/17/2013   ??? COPD (chronic obstructive pulmonary disease) (CMS-HCC) 11/07/2014   ??? 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 Annual physical exam 06/23/2011   ??? Arm swelling 07/20/2015   ??? Asthma 12/30/2006     Resolved Ambulatory Problems     Diagnosis Date Noted   ??? Vitamin B-complex deficiency 12/30/2006   ??? Intertrochanteric fracture (CMS-HCC) 10/17/2013   ??? Endometriosis 12/30/2006   ??? History of disease 12/24/2007   ??? Disorder of liver 12/24/2007   ??? 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 Lichen planus 12/30/2006   ??? Dislocation of shoulder region 10/17/2013   ??? History of surgical procedure 11/18/2012   ??? Impaired glucose tolerance 04/02/2014   ??? Swallowing painful 11/28/2012   ??? Subacute maxillary sinusitis 09/25/2015     Past Medical History:   Diagnosis Date   ??? Anxiety 10/17/2013   ??? Asthma 12/30/2006   ??? Bronchiectasis (CMS-HCC) 12/24/2007   ??? Dislocation of shoulder region 10/17/2013   ??? Endometriosis 12/30/2006   ??? Gastroesophageal reflux disease 12/30/2006   ??? Hemoptysis    ??? Impaired glucose tolerance 04/02/2014   ??? 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 Insomnia 11/22/2010   ??? Intertrochanteric fracture (CMS-HCC) 10/17/2013   ???  Lichen planus 12/30/2006   ??? Liver cyst 12/24/2007   ??? Osteoarthritis 12/30/2006   ??? Osteoporosis 12/30/2006   ??? Spontaneous pneumothorax    ??? Tobacco dependence syndrome 12/24/2007   ??? Vascular insufficiency of intestine (CMS-HCC) 12/24/2007   ??? Vitamin B-complex deficiency 12/30/2006        Past Surgical History:  Past Surgical History:   Procedure Laterality Date   ??? BRONCHOSCOPY  02/15/12   ??? ESOPHAGOGASTRODUODENOSCOPY  12/03/12   ??? FLEXIBLE BRONCHOSCOPY  11/05/12   ??? KNEE SURGERY     ??? LUNG SURGERY  11/18/12    Right upper Lobe, Superior Segmentectomy   ??? MEDIASTINOSCOPY  05/25/12   ??? OVARIAN CYST REMOVAL     ??? PR BRONCHOSCOPY,DIAGNOSTIC N/A 11/03/2017    Procedure: Bronchoscopy, Rigid Or Flexible, W/Wo Fluoroscopic Guidance; Diagnostic, With Cell Washing, When Performed;  Surgeon: Sherwood Gambler, MD;  Location: MAIN OR [facility];  Service: Pulmonary   ??? THORACOTOMY Right 11/05/12   ??? TONSILLECTOMY         Social History:  Social History     Social History   ??? Marital status: Single     Spouse name: N/A   ??? Number of children: N/A   ??? Years of education: N/A     Occupational History   ??? Not on file.     Social History Main Topics   ??? Smoking status: Current Every Day Smoker     Packs/day: 1.00     Types: Cigarettes   ??? Smokeless tobacco: Never Used   ??? Alcohol use 4.2 oz/week     7 Glasses of wine per week   ??? Drug use: No   ??? Sexual activity: Not on file     Other Topics Concern   ??? Not on file     Social History Narrative    Works in [employer] at [employer].  Previously worked in [employer] at [employer].       Family History:  Family History   Problem Relation Age of Onset   ??? Heart disease Maternal Grandfather    ??? Multiple sclerosis Sister    ??? Stroke Maternal Grandmother    ??? Cancer Paternal Grandmother    ??? Diabetes Mother    ??? Heart disease Mother    ??? Dementia Mother    ??? Cancer Father         sarcoma       Review of Systems: Pertinent items are noted in HPI.      Objective:     Physical Exam:  Vitals:    11/18/17 0700   BP:    Pulse:    Resp:    Temp:    SpO2: 100%       Mallampati Score: Class II - soft palate and base of the uvula are visible    ASA Class: III - A patient with severe systemic disease    Pertinent Labs:   BUN   Date Value Ref Range Status   11/18/2017 5 (L) 7 - 21 mg/dL Final     Creatinine   Date Value Ref Range Status   11/18/2017 0.50 (L) 0.60 - 1.00 mg/dL Final     INR   Date Value Ref Range Status   11/02/2017 1.26  Final     APTT   Date Value Ref Range Status   10/31/2017 31.7 27.7 - 37.7 sec Final     WBC   Date Value Ref Range Status  11/18/2017 10.0 4.5 - 11.0 10*9/L Final     HGB   Date Value Ref Range Status   11/18/2017 7.9 (L) 12.0 - 16.0 g/dL Final     HCT   Date Value Ref Range Status   11/18/2017 25.1 (L) 36.0 - 46.0 % Final     Platelet   Date Value Ref Range Status   11/18/2017 793 (H) 150 - 440 10*9/L Final       PRE-OP DOCUMENTATION: PROVIDER CERTIFICATION    I certify that the appropriate documentation of the patient's evaluation, assessment and treatment plan is found within the medical record and that the patient???s condition is unchanged from this earlier assessment.    SITE MARKING ATTESTATION    Site Marked: Not required    CONSENT FOR OPERATION OR PROCEDURE: PROVIDER CERTIFICATION    I hereby certify that the nature, purpose, benefits, usual and most frequent risks of, and alternatives to, the operation or procedure have been explained to the patient (or person authorized to sign for the patient) either by a physician or by the provider who is to perform the operation or procedure, that the patient has had an opportunity to ask questions, and that those questions have been answered. The patient or the patient's representatiive has been advised that the selected tasks may be performed by assistants to the primary health care provider(s). I believe that the patient (or person authorized to sign for the patient) understands what has been explained, and has consented to the operation or procedure.

## 2017-11-18 NOTE — Unmapped (Signed)
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 AFB smears of multiple respiratory samples have been negatives, making it less likely that she has a mycobacterial infection. She is clinically stable overall but had fever again last night. She had repeat bronchoscopy today.      ID Problem List:  1. 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 Hx of pulmonary M.kansasii infection in 2013 s/p RUL lobectomy and antimicrobial therapy with multiple negative AFB sputum cultures suspequently  3. COPD/bronchiectasis      Recommendations:  1. 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 Follow up BAL studies from today's bronch ( gram stain and aerobic/anaerobic cx, fungal cx, AFB smear and cx and  galactomannan)  3. I would continue the meropenem for now until the initial cx results from the repeat bronch are back        Thank you for involving [place] in the care of this patient. The General ID service Team B will continue to follow.   Please call the General B pager with questions at [phone].     Michale Emmerich MD      Interval events and ROS:  She had fever again last night. No sob. Cough unchanged. She had bronch today which she tolerated well.    Allergies: Diphenhydramine; Pseudoephedrine hcl; Levofloxacin; Alendronate sodium; Diphenhydramine hcl; Pheniramine; Pheniramine maleate; Pseudoephedrine; Tolmetin; and Codeine-guaifenesin      Medications:   Current antimicrobials:  Meropenem (12/2-)  Voriconazole (12/12-)  ??  Previous antimicrobials:  Unasyn    Other medications reviewed.       Objective:   Vital signs:  Temp:  [36.4 ??C (97.5 ??F)-39 ??C (102.2 ??F)] 39 ??C (102.2 ??F)  Heart Rate:  [67-123] 102  Resp:  [15-30] 21  BP: (89-118)/(49-87) 89/52  MAP (mmHg):  [61-92] 61  FiO2 (%):  [100 %] 100 %  SpO2:  [92 %-100 %] 96 %    Physical exam   General: in no acute distress  HEENT: anicteric conjunctivae   LUNGS: normal work of breathing  ABDOMINAL: soft, no tenderness  MSK: no edema, no joint effusions   SKIN: no rash or skin breakdown  NEUROLOGICAL: alert and oriented x3  PSYCH: interactive  LINES: appear non-infected      Labs:    Lab Results   Component Value Date    WBC 10.0 11/18/2017    NEUTROABS 7.2 11/18/2017    LYMPHSABS 1.4 (L) 11/18/2017    EOSABS 0.3 11/18/2017    HGB 7.9 (L) 11/18/2017    HCT 25.1 (L) 11/18/2017    PLT 793 (H) 11/18/2017       Lab Results   Component Value Date    NA 136 11/18/2017    K 4.5 11/18/2017    CL 94 (L) 11/18/2017    CO2 31.0 (H) 11/18/2017  BUN 5 (L) 11/18/2017    CREATININE 0.50 (L) 11/18/2017    CALCIUM 8.9 11/18/2017    MG 1.7 11/18/2017    PHOS 3.8 11/18/2017       Lab Results   Component Value Date    ALKPHOS 80 11/15/2017    BILITOT 0.6 11/15/2017    BILIDIR 0.30 11/15/2017    PROT 6.2 (L) 11/15/2017    ALBUMIN 3.3 (L) 11/15/2017    ALT 27 11/15/2017    AST 15 11/15/2017       Lab Results   Component Value Date    CRP 170.2 (H) 11/14/2017         Drug monitoring:  Lab Results   Component Value Date    VANCOTROUGH 9.4 (L) 11/10/2017         Microbiology:    Microbiology Results (last day)     Procedure Component Value Date/Time Date/Time    Pneumocystis DFA [[id number]] Collected:  11/18/17 0958    Lab Status:  In process Specimen:  Lavage, Bronchial from Lung, Right Lower Lobe Updated:  11/18/17 1100    Aspergillus Galactomannan AG, BAL [[id number]] Collected:  11/18/17 0957    Lab Status:  In process Specimen:  Lavage, Bronchial from Lung, Right Lower Lobe Updated:  11/18/17 1100    Respiratory Virus Group NAA [[id number]] Collected:  11/18/17 0958    Lab Status:  In process Specimen:  Lavage, Bronchial from Lung, Right Lower Lobe Updated:  11/18/17 1100    AFB culture [[id number]] Collected:  11/18/17 0957    Lab Status:  In process Specimen:  Lavage, Bronchial from Lung, Right Lower Lobe Updated:  11/18/17 1100    AFB SMEAR [[id number]] Collected:  11/18/17 0957    Lab Status:  In process Specimen:  Lavage, Bronchial from Lung, Right Lower Lobe Updated:  11/18/17 1100    Fungal Culture [[id number]] Collected:  11/18/17 0958    Lab Status:  In process Specimen:  Lavage, Bronchial from Lung, Right Lower Lobe Updated:  11/18/17 1100    Bronchial culture [[id number]] Collected:  11/18/17 0958    Lab Status:  In process Specimen:  Lavage, Bronchial from Lung, Right Lower Lobe Updated:  11/18/17 1059    Fungal Culture [[id number]] Collected:  11/18/17 0958    Lab Status:  In process Specimen:  Washing, Bronchial from Lung, Right Mainstem Updated:  11/18/17 1056    Lower Respiratory Culture [[id number]] Collected:  11/18/17 0958    Lab Status:  In process Specimen:  Washing, Bronchial from Lung, Right Mainstem Updated:  11/18/17 1056    Body fluid cell count [[id number]] Collected:  11/18/17 0957    Lab Status:  Preliminary result Specimen:  Lavage, Bronchial from Lung, Right Lower Lobe Updated:  11/18/17 1029     Fluid Type Lavage, Bronchial     Comment: Right lower lobe        Color, Fluid Colorless     Appearance, Fluid Hazy     Nucleated Cells, Fluid 102 ul      RBC, Fluid 17 ul     Blood Culture, Adult [[id number]]  (Normal) Collected:  11/16/17 0251    Lab Status:  Preliminary result Specimen:  Blood from PICC Line Updated: 11/18/17 0300     Blood Culture, Routine No Growth at 48 hours    Blood Culture [[id number]]  (Normal) Collected:  11/13/17 0052    Lab Status:  Final result Specimen:  Blood from PICC Line Updated:  11/18/17 0130     Blood  Culture, Routine No Growth at 5 days    Blood Culture [[id number]]  (Normal) Collected:  11/13/17 0052    Lab Status:  Final result Specimen:  Blood from Peripheral Updated:  11/18/17 0130     Blood Culture, Routine No Growth at 5 days    Legionella Culture [[id number]]  (Abnormal) Collected:  11/03/17 1152    Lab Status:  Final result Specimen:  Washing, Bronchial from Lung, Combined Updated:  11/17/17 1430     Legionella Culture Aspergillus fumigatus (A)    Narrative:       Specimen Source: Lung, Combined    Fungal Culture [[id number]] Collected:  11/03/17 1152    Lab Status:  Preliminary result Specimen:  Washing, Bronchial from Lung, Combined Updated:  11/17/17 1128     Fungal Pathogen Screen NEGATIVE TO DATE     Fungus Stain NO FUNGI SEEN    Narrative:       Specimen Source: Lung, Combined            Studies:   Ct Chest Wo Contrast    Result Date: 11/16/2017  EXAM: CT CHEST WO CONTRAST DATE: 11/16/2017 4:03 PM ACCESSION: [id number] UN DICTATED: 11/16/2017 4:08 PM INTERPRETATION LOCATION: [facility]     CLINICAL INDICATION: 61 years old Female with Other-follow-up chronic pneumonia-      COMPARISON: Chest CT 11/05/2017 TECHNIQUE: A spiral CT scan was obtained without IV contrast from the thoracic inlet through the hemidiaphragms. Images were reconstructed in the axial plane.  Coronal and sagittal reformatted images of the chest were also provided for further evaluation of the lung parenchyma.     FINDINGS: Left PICC terminates in right atrium.     AIRWAYS, LUNGS, PLEURA: Emphysematous changes. Post right upper lobectomy. Right apical thick-walled cystic lesion similar to prior. Slight interval worsening in consolidation of the superior aspect of the right lower lobe. Short interval increase in patchy airspace opacities with adjacent groundglass and asymmetric intralobular septal thickening in the base of the right lower lobe. New foci of endobronchial opacification in right lower lobe.     MEDIASTINUM: Normal heart size. No pericardial effusion. Normal caliber thoracic aorta. Aortic and coronary artery calcifications.     Right paratracheal lymphadenopathy, with increase in right superior paratracheal region, up to 1.1 cm, previously subcentimeter, (2:31.     IMAGED ABDOMEN: Unremarkable.     SOFT TISSUES: Unremarkable.     BONES: Unchanged chronic changes in the right lateral second and sixth ribs. 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          --Short interval increase in patchy consolidations, groundglass opacities, and asymmetric interstitial opacities in the right lung base could reflect worsening multifocal pneumonia, superimposed acute aspiration (particularly given new RLL endobronchial opacification), and/or asymmetric edema.     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 Attention on follow-up.     --Stable to slight increase in consolidative opacities in the upper portion of the right lower lobe.

## 2017-11-18 NOTE — Unmapped (Addendum)
Problem: Patient Care Overview  Goal: Plan of Care Review  Outcome: Progressing  Patient alert and oriented. On droplet and contact for adenovirus. On 2 L oxygen via [place]. Patient c/o feeling weird. Requested Zanaflex 4 mg, RN encouraged pt to try to rest. Patient had fever x 1 during shift, given Tylenol. Remained restless majority of shift. NPO at midnight for bronch. Calorie count to begin at midnight. Will continue POC.    Problem: Infection, Risk/Actual (Adult)  Goal: Infection Prevention/Resolution  Patient will demonstrate the desired outcomes by discharge/transition of care.   Outcome: Progressing      Problem: VTE, DVT and PE (Adult)  Goal: Signs and Symptoms of Listed Potential Problems Will be Absent, Minimized or Managed (VTE, DVT and PE)  Signs and symptoms of listed potential problems will be absent, minimized or managed by discharge/transition of care (reference VTE, DVT and PE (Adult) CPG).   Outcome: Progressing      Problem: Breathing Pattern Ineffective (Adult)  Goal: Effective Oxygenation/Ventilation  Patient will demonstrate the desired outcomes by discharge/transition of care.   Outcome: Progressing      Problem: Pain, Chronic (Adult)  Goal: Acceptable Pain/Comfort Level and Functional Ability  Patient will demonstrate the desired outcomes by discharge/transition of care.  Outcome: Progressing

## 2017-11-18 NOTE — Unmapped (Signed)
Tolerated svn treatment well. Declined vest.

## 2017-11-19 LAB — BASIC METABOLIC PANEL
ANION GAP: 9 mmol/L (ref 9–15)
BLOOD UREA NITROGEN: 4 mg/dL — ABNORMAL LOW (ref 7–21)
BUN / CREAT RATIO: 9
CALCIUM: 8.9 mg/dL (ref 8.5–10.2)
CHLORIDE: 101 mmol/L (ref 98–107)
CREATININE: 0.45 mg/dL — ABNORMAL LOW (ref 0.60–1.00)
EGFR MDRD NON AF AMER: >=60 mL/min/{1.73_m2} (ref >=60)
GLUCOSE RANDOM: 98 mg/dL (ref 65–179)
POTASSIUM: 4.7 mmol/L (ref 3.5–5.0)
SODIUM: 140 mmol/L (ref 135–145)

## 2017-11-19 LAB — CBC W/ AUTO DIFF
BASOPHILS ABSOLUTE COUNT: 0 10*9/L (ref 0.0–0.1)
EOSINOPHILS ABSOLUTE COUNT: 0.2 10*9/L (ref 0.0–0.4)
HEMOGLOBIN: 8.1 g/dL — ABNORMAL LOW (ref 12.0–16.0)
LARGE UNSTAINED CELLS: 3 % (ref 0–4)
LYMPHOCYTES ABSOLUTE COUNT: 1 10*9/L — ABNORMAL LOW (ref 1.5–5.0)
MEAN CORPUSCULAR HEMOGLOBIN CONC: 32.1 g/dL (ref 31.0–37.0)
MEAN CORPUSCULAR HEMOGLOBIN: 31.5 pg (ref 26.0–34.0)
MEAN CORPUSCULAR VOLUME: 98.3 fL (ref 80.0–100.0)
MEAN PLATELET VOLUME: 7.5 fL (ref 7.0–10.0)
MONOCYTES ABSOLUTE COUNT: 0.6 10*9/L (ref 0.2–0.8)
NEUTROPHILS ABSOLUTE COUNT: 8 10*9/L — ABNORMAL HIGH (ref 2.0–7.5)
PLATELET COUNT: 737 10*9/L — ABNORMAL HIGH (ref 150–440)
RED BLOOD CELL COUNT: 2.57 10*12/L — ABNORMAL LOW (ref 4.00–5.20)
RED CELL DISTRIBUTION WIDTH: 14.1 % (ref 12.0–15.0)
WBC ADJUSTED: 10.1 10*9/L (ref 4.5–11.0)

## 2017-11-19 LAB — PLATELET COUNT: Lab: 737 — ABNORMAL HIGH

## 2017-11-19 LAB — MAGNESIUM: Magnesium:MCnc:Pt:Ser/Plas:Qn:: 2

## 2017-11-19 LAB — BUN / CREAT RATIO: Urea nitrogen/Creatinine:MRto:Pt:Ser/Plas:Qn:: 9

## 2017-11-19 LAB — PHOSPHORUS: Phosphate:MCnc:Pt:Ser/Plas:Qn:: 4.5

## 2017-11-19 LAB — C-REACTIVE PROTEIN: C reactive protein:MCnc:Pt:Ser/Plas:Qn:: 253.5 — ABNORMAL HIGH

## 2017-11-19 LAB — VORICONAZOLE LEVEL: Lab: 7.5 — ABNORMAL HIGH

## 2017-11-19 NOTE — Unmapped (Addendum)
Pt Alert and Oriented. VSS, Afebrile, and Titrated to RA with O2 sats above 95%.. Ambulated Independently. Scheduled Medications given per MD orders. Contact Precautions Maintained. Patient rested comfortably. Will continue to monitor.       Problem: Patient Care Overview  Goal: Plan of Care Review  Outcome: Progressing    Goal: Individualization and Mutuality  Outcome: Progressing    Goal: Discharge Needs Assessment  Outcome: Progressing    Goal: Interprofessional Rounds/Family Conf  Outcome: Progressing      Problem: Infection, Risk/Actual (Adult)  Goal: Infection Prevention/Resolution  Patient will demonstrate the desired outcomes by discharge/transition of care.   Outcome: Progressing      Problem: VTE, DVT and PE (Adult)  Goal: Signs and Symptoms of Listed Potential Problems Will be Absent, Minimized or Managed (VTE, DVT and PE)  Signs and symptoms of listed potential problems will be absent, minimized or managed by discharge/transition of care (reference VTE, DVT and PE (Adult) CPG).   Outcome: Progressing      Problem: Breathing Pattern Ineffective (Adult)  Goal: Effective Oxygenation/Ventilation  Patient will demonstrate the desired outcomes by discharge/transition of care.   Outcome: Progressing      Problem: Pain, Chronic (Adult)  Goal: Identify Related Risk Factors and Signs and Symptoms  Related risk factors and signs and symptoms are identified upon initiation of Human Response Clinical Practice Guideline (CPG).   Outcome: Progressing    Goal: Acceptable Pain/Comfort Level and Functional Ability  Patient will demonstrate the desired outcomes by discharge/transition of care.   Outcome: Progressing

## 2017-11-19 NOTE — Unmapped (Addendum)
Problem: Patient Care Overview  Goal: Plan of Care Review  Outcome: Progressing   11/19/17 0645   OTHER   Plan of Care Reviewed With patient   Plan of Care Review   Progress improving     Pt alert and oriented.  Reported dyspnea with exertion.  Pt on 2L of oxygen.  Given PRN oxy for pain.  Abx tolerated well. Had a temp of 39.5.  Given PRN tylenol.  Notified MD. No falls noted.  Continue POC.      Problem: VTE, DVT and PE (Adult)  Goal: Signs and Symptoms of Listed Potential Problems Will be Absent, Minimized or Managed (VTE, DVT and PE)  Signs and symptoms of listed potential problems will be absent, minimized or managed by discharge/transition of care (reference VTE, DVT and PE (Adult) CPG).   Outcome: Progressing      Problem: Breathing Pattern Ineffective (Adult)  Goal: Effective Oxygenation/Ventilation  Patient will demonstrate the desired outcomes by discharge/transition of care.   Outcome: Progressing

## 2017-11-19 NOTE — Unmapped (Signed)
Med G Daily Progress Note      Interval History:  Febrile again to 39.5 overnight. Continues to have visual symptoms from voriconazole, but have not worsened. Continues to have productive cough. Also has new red bumps on her right leg.    Assessment/Plan:    Principal Problem:    Fever  Active Problems:    Tobacco use disorder    Bronchiectasis without complication (RAF-HCC)    History of surgical procedure    COPD (chronic obstructive pulmonary disease) (CMS-HCC)    Post-thoracotomy pain syndrome    Cough    History of Mycobacterium kansasii infection  Resolved Problems:    * No resolved hospital problems. *    Mallory [name] is a 61 year old woman with a PMH of COPD, bronchiectasis, and M kansasii infection s/p resection, RULectomy and superior RLL segmentectomy (2013) who presented to [facility] on 10/31/2017 with fever and cough, found to have RML consolidation, concerning for infection. 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  ??  RML consolidation  Rigid bronch 11/30 performed without fistula visualized (though cannot rule out microscopic fistula). Continues to fever to above 39 every night despite being on IV meropenem. Lower respiratory cultures have grown single colonies of Aspergillus, which were not thought to be significant, and galactomannan was negative. However, because of her persistent fevers, she is being empirically treated with voriconazole. CT chest demonstrated interval increase in patchy consolidations, GGO, and asymmetric interstitial opacities in the right lung base, as well as increase in upper RLL opacities. Underwent bronch w/ BAL on 12/15 in order to get a better sample and determine which organism to treat for. Leukocytosis improved to 10.1, although CRP still elevated at 253. Will decrease voriconazole dose given elevated trough and visual symptoms.  - IV meropenem, may discontinue on 12/17  - PO voriconazole 100 mg BID (12/12 - )  - follow up fungitell assay  - follow up BAL studies  - airway clearance w/ HTS 3% nebs, Aerobika, and chest vest    Thrombocytosis  Improved to 737, may be a sign of improving inflammation.  - daily CBC    Chronic post-thoracotomy pain syndrome  Chronic pain was consulted and provided recommendations.  - Tylenol 1000 mg q6h PRN  - oxycodone 5-10 mg q6h PRN  - tizanidine 2-4 mg TID PRN  - lidocaine patches    Iron deficiency  Iron 17, transferrin 137.6, and ferritin only 180, so likely iron deficient. Received IV iron 125 mg daily for 5 days (12/7 - 12/11).    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  COPD  - albuterol inhaler PRN  - Flovent  ??  Tobacco dependence  - nicotine lozenges    FEN/GI/DVT  - GI: home PPI  - DVT: Lovenox 40 mg daily  ??  Code status: DNR and DNI; confirmed with patient on admission    Patient has Advanced Directive with [name]  Healthcare POA: [name] [phone])     Dispo: Med G, floor  __________________________________________________________    Labs/Studies:  Labs and Studies from the last 24hrs per EMR and Reviewed    Objective:  Temp:  [36.8 ??C-39.5 ??C] 37.7 ??C  Heart Rate:  [82-119] 102  Resp:  [17-24] 18  BP: (85-126)/(52-73) 105/73  SpO2:  [94 %-100 %] 96 %,   No intake or output data in the 24 hours ending 11/19/17 1511  Gen: sitting up in bed, NAD  CV: tachycardic and regualar, no murmurs  Pulm: clear on the left, diminished on the right, crackles in the right lower field  Abd: soft, NT, ND  Extr: WWP, no tenderness of edema of lower extremities; 4 small erythematous papules on lateral aspect of right knee

## 2017-11-19 NOTE — Unmapped (Signed)
Med G Daily Progress Note      Interval History:  Continued fevers. Bronchoscopy performed 11/18/17 AM.    Assessment/Plan:    Principal Problem:    Fever  Active Problems:    Tobacco use disorder    Bronchiectasis without complication (RAF-HCC)    History of surgical procedure    COPD (chronic obstructive pulmonary disease) (CMS-HCC)    Post-thoracotomy pain syndrome    Cough    History of Mycobacterium kansasii infection  Resolved Problems:    * No resolved hospital problems. *    Shakenna Herrero is a 61 year old woman with a PMH of COPD, bronchiectasis, and M kansasii infection s/p resection, RULectomy and superior RLL segmentectomy (2013) who presented to [facility] on 10/31/2017 with fever and cough, found to have RML consolidation, concerning for infection. 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  ??  RML consolidation  Rigid bronch 11/30 performed without fistula visualized (though cannot rule out microscopic fistula). Repeat CT chest 12/2 revealed increased consolidations in upper portion of RLL and opacities concerning for infection, unchanged complete RML opacification and occluded RML airways. 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 Continues to fever to above 39 every night despite being on IV meropenem. Lower respiratory cultures have grown single colonies of Aspergillus, which were not thought to be significant, and galactomannan was negative. However, because of her persistent fevers, we will empirically cover for fungal infections with voriconazole. CT chest demonstrated interval increase in patchy consolidations, GGO, and asymmetric interstitial opacities in the right lung base, as well as increase in upper RLL opacities. Will likely need a bronch w/ BAL in order to get a better sample and determine which organism to treat for.  - IV meropenem for 6 week course (12/2 - 1/16)  - PO voriconazole 200 mg BID (12/12 - )  - follow up voriconazole levels given visual symptoms, will hold off on switching to posaconazole  - follow up fungitell assay  [ ]  f/u bronch w/ BAL on 12/15  - airway clearance: HTS 3% nebs, Aerobika, and vest    Thrombocytosis  Platelets elevated in the 800s - 900s. May simply be an acute phase reactant. Low suspicion for malignancy given normal MVP.  - daily CBC    Chronic post-thoracotomy pain syndrome  Chronic pain was consulted and provided recommendations.  - Tylenol 1000 mg q6h PRN  - oxycodone 5-10 mg q6h PRN  - tizanidine 2-4 mg TID PRN  - lidocaine patches    Iron deficiency  Iron 17, transferrin 137.6, and ferritin only 180, so likely iron deficient. Received IV iron 125 mg daily for 5 days (12/7 - 12/11).    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  COPD  - albuterol inhaler PRN  - Flovent  ??  Tobacco dependence  - nicotine lozenges 4mg  q1hr prn, will also order on discharge    FEN/GI/DVT  - GI: home PPI  - DVT: Lovenox 40 mg daily  ??  Code status: DNR and DNI; confirmed with patient on admission    Patient has Advanced Directive with [name]  Healthcare POA: [name] [phone])     Dispo: Med G, floor  __________________________________________________________        Labs/Studies:  Labs and Studies from the last 24hrs per EMR and Reviewed  Objective:  Temp:  [36.4 ??C-39.5 ??C] 39.5 ??C  Heart Rate:  [67-123] 94  Resp:  [15-30] 24  BP: (81-126)/(49-87) 126/72  FiO2 (%):  [100 %] 100 %  SpO2:  [92 %-100 %] 94 %,   No intake or output data in the 24 hours ending 11/18/17 2220  Gen: sitting up in bed, NAD  CV: tachycardic and regualr, no murmurs  Pulm: clear on the left, diminished on the right, crackles in the right lower field  Abd: soft, NT, ND  Extr: WWP, no tenderness of edema of lower extremities

## 2017-11-19 NOTE — Unmapped (Addendum)
Problem: Patient Care Overview  Goal: Plan of Care Review  Outcome: Progressing  Bronch today. Post bronc fever and hypotension. MD notified. Tylenol given for fever. PICC dressing changed. Patient tolerated well. Family at bedside. AM voraconazole give late. RN contacted pharmacy re: concerns for interaction with fentanyl given during bronc. Pharm advised to proceed with admin. Will continue POC.

## 2017-11-19 NOTE — Unmapped (Signed)
Problem: Breathing Pattern Ineffective (Adult)  Goal: Effective Oxygenation/Ventilation  Patient will demonstrate the desired outcomes by discharge/transition of care.   Outcome: Not Progressing  Patient has been stable throughout shift on nasal cannula @2LPM .  Inhaled medications given, airway clearance via chest vest, all therapies tolerated well with no adverse reactions. Strong non-productive cough.

## 2017-11-20 ENCOUNTER — Other Ambulatory Visit: Payer: Self-pay | Admitting: *Deleted

## 2017-11-20 LAB — HEPATIC FUNCTION PANEL
ALBUMIN: 2.8 g/dL — ABNORMAL LOW (ref 3.5–5.0)
AST (SGOT): 28 U/L (ref 14–38)
BILIRUBIN DIRECT: 0.5 mg/dL — ABNORMAL HIGH (ref 0.00–0.40)
BILIRUBIN TOTAL: 0.5 mg/dL (ref 0.0–1.2)
PROTEIN TOTAL: 5.6 g/dL — ABNORMAL LOW (ref 6.5–8.3)

## 2017-11-20 LAB — CBC W/ AUTO DIFF
BASOPHILS ABSOLUTE COUNT: 0 10*9/L (ref 0.0–0.1)
HEMATOCRIT: 24.4 % — ABNORMAL LOW (ref 36.0–46.0)
HEMOGLOBIN: 7.6 g/dL — ABNORMAL LOW (ref 12.0–16.0)
LARGE UNSTAINED CELLS: 3 % (ref 0–4)
LYMPHOCYTES ABSOLUTE COUNT: 1.2 10*9/L — ABNORMAL LOW (ref 1.5–5.0)
MEAN CORPUSCULAR HEMOGLOBIN CONC: 30.9 g/dL — ABNORMAL LOW (ref 31.0–37.0)
MEAN CORPUSCULAR HEMOGLOBIN: 31.1 pg (ref 26.0–34.0)
MEAN CORPUSCULAR VOLUME: 100.5 fL — ABNORMAL HIGH (ref 80.0–100.0)
MEAN PLATELET VOLUME: 7.9 fL (ref 7.0–10.0)
MONOCYTES ABSOLUTE COUNT: 0.6 10*9/L (ref 0.2–0.8)
PLATELET COUNT: 756 10*9/L — ABNORMAL HIGH (ref 150–440)
RED BLOOD CELL COUNT: 2.43 10*12/L — ABNORMAL LOW (ref 4.00–5.20)
RED CELL DISTRIBUTION WIDTH: 14.1 % (ref 12.0–15.0)
WBC ADJUSTED: 8.5 10*9/L (ref 4.5–11.0)

## 2017-11-20 LAB — TOTAL IGE: Lab: 992 — ABNORMAL HIGH

## 2017-11-20 LAB — BASIC METABOLIC PANEL
ANION GAP: 11 mmol/L (ref 9–15)
BUN / CREAT RATIO: 9
CALCIUM: 8.7 mg/dL (ref 8.5–10.2)
CO2: 29 mmol/L (ref 22.0–30.0)
CREATININE: 0.46 mg/dL — ABNORMAL LOW (ref 0.60–1.00)
EGFR MDRD AF AMER: >=60 mL/min/{1.73_m2} (ref >=60)
EGFR MDRD NON AF AMER: >=60 mL/min/{1.73_m2} (ref >=60)
GLUCOSE RANDOM: 102 mg/dL (ref 65–179)
POTASSIUM: 4.3 mmol/L (ref 3.5–5.0)

## 2017-11-20 LAB — LYMPHOCYTES ABSOLUTE COUNT: Lab: 1.2 — ABNORMAL LOW

## 2017-11-20 LAB — PHOSPHORUS: Phosphate:MCnc:Pt:Ser/Plas:Qn:: 4

## 2017-11-20 LAB — ALT (SGPT): Alanine aminotransferase:CCnc:Pt:Ser/Plas:Qn:: 28

## 2017-11-20 LAB — MAGNESIUM: Magnesium:MCnc:Pt:Ser/Plas:Qn:: 1.8

## 2017-11-20 LAB — ANION GAP: Anion gap 3:SCnc:Pt:Ser/Plas:Qn:: 11

## 2017-11-20 NOTE — Patient Outreach (Addendum)
[facility]) Care Management  22/57/5051  DEDRIA ENDRES [birth year] [id number]  Subjective: Received voicemail message times 2 from Ms. Helia Haese, states she is returning call, is currently in the hospital, and requested call back on mobile number. Telephone call to patient's mobile number, spoke with Ms. Plourde, states she is currently receiving a treatment, left HIPAA compliant  message, and requested call back.   Advised will also try to call her back in a few days and Ms. Arcia voiced understanding.     Objective: Per KPN (Knowledge Performance Now, point of care tool) and chart review, patient admitted to [facility] on 10/31/17 and discharge date unknown for shortness of breath.  Patient has COLD (chronic obstructive lung disease), M kansasii status post resection, chronic bronchopulmonary fistula, Tobacco dependence, and COPD.        Assessment:   Received UMR Transition of care referral on 11/06/17.  Transition of care follow up pending patient contact.     Plan:  RNCM will call patient for  telephone outreach attempt, transition of care follow up, within 3 business days of hospital discharge notification.    Jadelyn Elks H. Annia Friendly, BSN, [place] Management [facility] Telephonic CM Phone: [phone] Fax: [phone]

## 2017-11-20 NOTE — Unmapped (Signed)
PHYSICAL THERAPY  Evaluation (11/20/17 0828)     Patient Name:  Mallory [name]       Medical Record Number: [id number]   Date of Birth: 29-Mar-1956  Sex: Female            Treatment Diagnosis: Generalized weakness    ASSESSMENT    Mallory [name] is a 61 y.o. female with PMHx of COPD, bronchiectasis, and M kansasii s/p resection, right upper lobectomy and superior RLL segmentectomy (2013) who presented to [facility] with Fever and cough.  Pt was agreeable to work with PT, demonstrating bed mobility, transfers, and ambulation. Pt also tolerated seated and reclined supine ther ex. The pt is independent with functional mobility despite the pt feeling 'jelly legs' with ambulating. Pt also has no further concerns. Based on the AM-PAC 6 item raw score of 24/24, the patient is considered to be 0% impaired with basic mobility. There are no acute or post-acute needs at this time.     Today's Interventions: AM PAC: 24/24. Pt educated re: PT role and POC, importance of mobility and getting OOB to recliner and ambulating throughout the day, continuing with ther ex. Pt completed the following ther ex x10 bilat: ankle pumps, LAQ, seated marches, SLR (x5 bilat)    Activity Tolerance: Patient tolerated treatment well;Patient limited by fatigue    PLAN  Planned Frequency of Treatment:  D/C Services for: D/C Services      Post-Discharge Physical Therapy Recommendations:  PT services not indicated        PT DME Recommendations: None                      Prognosis:  Good  Barriers to Discharge: None  Positive Indicators: PLOF    SUBJECTIVE  Patient reports: Pt was agreeable to work with PT  Current Functional Status: Pt rec'd and left in reclined supine. Respiratory Therapy present as PT left room. RN Victorino Dike cleared pt for PT and made aware of outcomes.   Services patient receives: PT;OT  Prior functional status: Pt states she has not been using an AD since her hip fx in 2014, but admits to 3 near falls in the last six months.  Equipment available at home: Tub bench;Shower Restaurant manager, fast food    Past Medical History:   Diagnosis Date   ??? Anxiety 10/17/2013   ??? Asthma 12/30/2006    Overview:  Qualifier: Diagnosis of  By: [name], [name].   Last Assessment & Plan:  Asx, lung exam today ok, cutting down tobacco    ??? Bronchiectasis (CMS-HCC) 12/24/2007    Overview:  Followed in Pulmonary clinic/ [facility]/ [facility]     - See CT 11/10/2005 RLL changes    - New changes RUL CT Chest  12/29/2011 ? All related to bronchiectasis >FOB 02/15/12 > neg cyt/ neg tbbx/ no wbc's on gm stain ? Afb/fungal studies done as requested    - 02/29/12 Rec PET Scan > pt declined > 03/05/2012 rec t surgery eval for RUL obectomy done 11/06/12 > Pos M Kansasii> f/u ID     - a   ??? Dislocation of shoulder region 10/17/2013   ??? Endometriosis 12/30/2006    Overview:  Qualifier: Diagnosis of  By: [name], Jose E.    ??? Gastroesophageal reflux disease 12/30/2006    Overview:  Qualifier: Diagnosis of  By: [name], [name].   Last Assessment & Plan:  Symptoms relatively well controlled with PPIs twice a day    ???  Hemoptysis    ??? Impaired glucose tolerance 04/02/2014    Last Assessment & Plan:  A1c 08-2013 was 5.9. Patient aware, recheck her A1c    ??? 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 s/p right partial lobectomy   ??? Insomnia 11/22/2010    Overview:  Qualifier: Diagnosis of  By: [name], [name].   Last Assessment & Plan:  Well-controlled with alprazolam    ??? Intertrochanteric fracture (CMS-HCC) 10/17/2013    Last Assessment & Plan:  Status post left hip injury  10/2013, doing outpatient PT.    ??? Lichen planus 12/30/2006    Overview:  Annotation: s/p mouth BX Qualifier: Diagnosis of  By: [name], Jose E.    ??? Liver cyst 12/24/2007   ??? Osteoarthritis 12/30/2006    Overview:  Qualifier: Diagnosis of  By: [name], [name].   Last Assessment & Plan:  On hydrocodone, needs a refill. UDS low 08-2013    ??? Osteoporosis 12/30/2006    Overview:  Dx at age 56, multiple DEXAs in the past at gyn, last DEXA? Intolerant to fosamax reclast was denied in the past   Last Assessment & Plan:  Not recent bone density test, intolerant to Fosamax, insurance denied  reclast  before. Plan: Bone density test,vit d, ca and vit po supplements     ??? Spontaneous pneumothorax     x2 in her 68s, had chest tube for 1   ??? Tobacco dependence syndrome 12/24/2007    Overview:  Limits of effective care disussed in pulmonary clinic/ [facility]/ 01/20/12  Last Assessment & Plan:  Today she is again counseled, I discussed Wellbutrin, Chantix. She reports that in the past she got very aggressive while taking Chantix    ??? Vascular insufficiency of intestine (CMS-HCC) 12/24/2007    Overview:  Qualifier: Diagnosis of  By: Koleen Distance CMA Duncan Dull), Leisha     ??? Vitamin B-complex deficiency 12/30/2006    Overview:  Mild on oral supplements   Last Assessment & Plan:  B 12 levels  consistently normal     Social History   Substance Use Topics   ??? Smoking status: Current Every Day Smoker     Packs/day: 1.00     Types: Cigarettes   ??? Smokeless tobacco: Never Used   ??? Alcohol use 4.2 oz/week     7 Glasses of wine per week      Past Surgical History:   Procedure Laterality Date   ??? BRONCHOSCOPY  02/15/12   ??? ESOPHAGOGASTRODUODENOSCOPY  12/03/12   ??? FLEXIBLE BRONCHOSCOPY  11/05/12   ??? KNEE SURGERY     ??? LUNG SURGERY  11/18/12    Right upper Lobe, Superior Segmentectomy   ??? MEDIASTINOSCOPY  05/25/12   ??? OVARIAN CYST REMOVAL     ??? PR BRONCHOSCOPY,DIAGNOSTIC N/A 11/03/2017    Procedure: Bronchoscopy, Rigid Or Flexible, W/Wo Fluoroscopic Guidance; Diagnostic, With Cell Washing, When Performed;  Surgeon: Sherwood Gambler, MD;  Location: MAIN OR [facility];  Service: Pulmonary   ??? THORACOTOMY Right 11/05/12   ??? TONSILLECTOMY      Family History   Problem Relation Age of Onset   ??? Heart disease Maternal Grandfather    ??? Multiple sclerosis Sister    ??? Stroke Maternal Grandmother    ??? Cancer Paternal Grandmother    ??? Diabetes Mother ??? Heart disease Mother    ??? Dementia Mother    ??? Cancer Father  sarcoma        Allergies: Diphenhydramine; Pseudoephedrine hcl; Levofloxacin; Alendronate sodium; Diphenhydramine hcl; Pheniramine; Pheniramine maleate; Pseudoephedrine; Tolmetin; and Codeine-guaifenesin                Objective Findings              Precautions: Falls              Weight Bearing Status: Non- applicable              Required Braces or Orthoses: Non- applicable       Pain Comments: 6/10 pain on R sided rib/upper back area.   Medical Tests / Procedures: Chart reviewed.   Equipment / Environment: Vascular access (PIV, TLC, Port-a-cath, PICC);Telemetry    At Rest: SpO2 ~90-93% on RA  With Activity: Following ambulation, RN came in room stating pt required some O2, but tele box initially showed 87% on RA then jumped to 94% on RA. A secondary pulse ox was also showing ~89-93%, with pt being in 90s when PT left room.   Orthostatics: asymptomatic        Living environment: House  Lives With: Sibling(s) (sister lives with her)  Home Living: Two level home;Able to Live on main level with bedroom/bathroom;Stairs to enter without rails;Tub/shower unit;Handicapped height toilet (bonus room upstairs)        Number of Stairs: 1    Cognition: Oriented x4 and answered questions appropriately. However, sometimes she would get on tangents and forget the question asked.   Visual / Perception Status: Pt wears glasses for distance  Skin Inspection: All visible skin intact    UE ROM: NT  UE Strength: NT  LE ROM: Appears WFL  LE Strength: Pt was able to demonstrate SLR bilat                           Balance: Sitting and standing balance independent         Bed Mobility: Reclined supine <--> sit independently  Transfers: Sit <--> stand independently without a device   Gait: Pt ambulated 240ft with modified independence and no device without LOB. Pt initially was holding onto the railing in the hallway because she felt unsteady (but did not appear unsteady), but was also able to ambulate without the railings when prompted without LOB.    Stairs: Pt was able to demonstrated standing high marches to simulate one step negotiation. Pt states her one step into the house is shorter than a standard step.       Endurance: Pt c/o some SOB, which she considers normal.     Eval Duration(PT): 30 Min.    Medical Staff Made Aware: RN made aware of outcomes.   PT G-Codes  Functional Assessment Tool Used: AM PAC  Score: 24/24  Functional Limitation: Mobility: Walking and moving around  Mobility: Walking and Moving Around Current Status [phone]): 0 percent impaired, limited or restricted  Mobility: Walking and Moving Around Goal Status [phone]): 0 percent impaired, limited or restricted  Mobility: Walking and Moving Around Discharge Status [phone]): 0 percent impaired, limited or restricted  Rationale: 0% impaired   I attest that I have reviewed the above information.  Signed: Caro Hight, PT  Filed 11/20/2017

## 2017-11-20 NOTE — Unmapped (Signed)
Patient was compliant with all inhaled scheduled medications and was under no apparent distress.

## 2017-11-20 NOTE — Unmapped (Signed)
GENERAL INFECTIOUS DISEASES TEAM B CONSULT NOTE  ??  ??  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 AFB smears of multiple respiratory samples have been negatives, making it less likely that she has a mycobacterial infection. She had repeat bronch on  11/18/17 and BAL studies are pending. Fever is trending down and she is feeling better. She continues to have visual symptoms and vori level came back high, so the dose is being adjusted.??  ??  ID Problem List:  1. 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 Hx of pulmonary M.kansasii infection in 2013 s/p RUL lobectomy and antimicrobial therapy with multiple negative AFB sputum cultures suspequently  3. COPD/bronchiectasis  ??  ??  Recommendations:  1. 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 Dose is being adjusted given high level. Hopefully her visual symptoms will improve with the lower dose   2. Follow up BAL studies from  Bronch done on 12/15   3. I agree with stopping meropenem  ??  ??  Thank you for involving [place] in the care of this patient. The General ID service Team B will continue to follow.   Please call the General B pager with questions at [phone].   ??  Sahmya Arai MD  ??  ??  Interval events and ROS:  Fever is trending down. Less cough. No sob. She continues having difficulty reading due to flashes and blurry vision.  ??  Allergies:  Diphenhydramine; Pseudoephedrine hcl; Levofloxacin; Alendronate sodium; Diphenhydramine hcl; Pheniramine; Pheniramine maleate; Pseudoephedrine; Tolmetin; and Codeine-guaifenesin  ??  ??  Medications:   Current antimicrobials:  Voriconazole (12/12-)  ??  Previous antimicrobials:  Unasyn  ??Meropenem (12/2-12/17)    Other medications reviewed.   ??  ??  Objective:   Vitals:    11/20/17 0200 11/20/17 0500 11/20/17 0941 11/20/17 1000   BP: 90/53 96/59 109/68    Pulse: 85 88 99    Resp: 17 17 18     Temp: 36.9 ??C (98.4 ??F) 37.1 ??C (98.8 ??F) 38.3 ??C (100.9 ??F)    TempSrc: Oral Oral Oral    SpO2: 92% 96% 91% 100%   Weight:       Height:         Physical exam   General: in no acute distress  HEENT: anicteric conjunctivae   LUNGS: normal work of breathing, crackles in R base  ABDOMINAL: soft, no tenderness  MSK: no edema, no joint effusions   SKIN: no rash or skin breakdown  NEUROLOGICAL: alert and oriented x3  PSYCH: interactive  LINES: appear non-infected  ??  ??  Labs:    Lab Results   Component Value Date    WBC 8.5 11/20/2017    HGB 7.6 (L) 11/20/2017    HCT 24.4 (L) 11/20/2017    PLT 756 (H) 11/20/2017       Lab Results   Component  Value Date    NA 138 11/20/2017    K 4.3 11/20/2017    CL 98 11/20/2017    CO2 29.0 11/20/2017    BUN 4 (L) 11/20/2017    CREATININE 0.46 (L) 11/20/2017    GLU 102 11/20/2017    CALCIUM 8.7 11/20/2017    MG 1.8 11/20/2017    PHOS 4.0 11/20/2017       Lab Results   Component Value Date    BILITOT 0.5 11/20/2017    BILIDIR 0.50 (H) 11/20/2017    PROT 5.6 (L) 11/20/2017    ALBUMIN 2.8 (L) 11/20/2017    ALT 28 11/20/2017    AST 28 11/20/2017    ALKPHOS 102 11/20/2017       Lab Results   Component Value Date    INR 1.26 11/02/2017    APTT 31.7 10/31/2017       Lab Results   Component Value Date    CRP 253.5 (H) 11/19/2017    CRP 170.2 (H) 11/14/2017         Drug monitoring:  Lab Results Component Value Date    VANCOTROUGH 9.4 (L) 11/10/2017    VORILVL 7.5 (H) 11/17/2017         Microbiology:    Microbiology Results (last day)     Procedure Component Value Date/Time Date/Time    Fungal Culture [[id number]] Collected:  11/18/17 0958    Lab Status:  Preliminary result Specimen:  Lavage, Bronchial from Lung, Right Lower Lobe Updated:  11/20/17 0937     Fungal Pathogen Screen NEGATIVE TO DATE     Fungus Stain NO FUNGI SEEN    Narrative:       Specimen Source: Lung, Right Lower Lobe    Fungal Culture [[id number]] Collected:  11/18/17 0958    Lab Status:  Preliminary result Specimen:  Washing, Bronchial from Lung, Right Mainstem Updated:  11/20/17 0937     Fungal Pathogen Screen NEGATIVE TO DATE     Fungus Stain NO FUNGI SEEN    Narrative:       Specimen Source: Lung, Right Mainstem    Lower Respiratory Culture [[id number]]  (Abnormal) Collected:  11/13/17 0532    Lab Status:  Final result Specimen:  Sputum from SPUTUM EXPECTORATED Updated:  11/20/17 0925     Lower Respiratory Culture Oropharyngeal Flora Isolated      Mould (A)     Comment: - - one colony  Further Identification By Consultation Only        Gram Stain >25 PMNS/LPF      <10  Epithelial cells/LPF      Probable 1+ Gram positive cocci      Acceptable for culture    Narrative:       Specimen Source: SPUTUM EXPECTORATED    Lower Respiratory Culture [[id number]] Collected:  11/18/17 0958    Lab Status:  Preliminary result Specimen:  Washing, Bronchial from Lung, Right Mainstem Updated:  11/20/17 0832     Lower Respiratory Culture NO GROWTH TO DATE     Gram Stain 2+ Polymorphonuclear leukocytes      No organisms seen    Narrative:       Specimen Source: Lung, Right Mainstem    Bronchial culture [[id number]] Collected:  11/18/17 0958    Lab Status:  Preliminary result Specimen:  Lavage, Bronchial from Lung, Right Lower Lobe Updated:  11/20/17 0824     Quantitative Bronchial Culture NO GROWTH TO DATE     Gram Stain Result 1+ Polymorphonuclear leukocytes  No organisms seen    Narrative:       Specimen Source: Lung, Right Lower Lobe    Blood Culture, Adult [[id number]]  (Normal) Collected:  11/16/17 0251    Lab Status:  Preliminary result Specimen:  Blood from PICC Line Updated:  11/20/17 0300     Blood Culture, Routine No Growth at 4 days        ??  Studies:   Ct Chest Wo Contrast  ??  Result Date: 11/16/2017  EXAM: CT CHEST WO CONTRAST DATE: 11/16/2017 4:03 PM ACCESSION: [id number] UN DICTATED: 11/16/2017 4:08 PM INTERPRETATION LOCATION: [facility]   ??  CLINICAL INDICATION: 61 years old Female with Other-follow-up chronic pneumonia-    ??  COMPARISON: Chest CT 11/05/2017 TECHNIQUE: A spiral CT scan was obtained without IV contrast from the thoracic inlet through the hemidiaphragms. Images were reconstructed in the axial plane.  Coronal and sagittal reformatted images of the chest were also provided for further evaluation of the lung parenchyma.   ??  FINDINGS: Left PICC terminates in right atrium.   ??  AIRWAYS, LUNGS, PLEURA: Emphysematous changes. Post right upper lobectomy. Right apical thick-walled cystic lesion similar to prior. Slight interval worsening in consolidation of the superior aspect of the right lower lobe. Short interval increase in patchy airspace opacities with adjacent groundglass and asymmetric intralobular septal thickening in the base of the right lower lobe. New foci of endobronchial opacification in right lower lobe.   ??  MEDIASTINUM: Normal heart size. No pericardial effusion. Normal caliber thoracic aorta. Aortic and coronary artery calcifications.   ??  Right paratracheal lymphadenopathy, with increase in right superior paratracheal region, up to 1.1 cm, previously subcentimeter, (2:31.   ??  IMAGED ABDOMEN: Unremarkable.   ??  SOFT TISSUES: Unremarkable.   ??  BONES: Unchanged chronic changes in the right lateral second and sixth ribs. 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   ??     ??  --Short interval increase in patchy consolidations, groundglass opacities, and asymmetric interstitial opacities in the right lung base could reflect worsening multifocal pneumonia, superimposed acute aspiration (particularly given new RLL endobronchial opacification), and/or asymmetric edema.   ??  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 Attention on follow-up.   ??  --Stable to slight increase in consolidative opacities in the upper portion of the right lower lobe.

## 2017-11-20 NOTE — Unmapped (Addendum)
Problem: Patient Care Overview  Goal: Plan of Care Review  Outcome: Progressing  Patient A/Ox4. Patient compliant with scheduled medications. Patient received PRN zanaflex 2mg  at 2110 as patient reports this helps her sleep and she would rather try this than her xanax. Medication was not effective for sleep but patient did report she had fewer muscle spasms today than normal. Patient requested full dose (4mg ) of Zanaflex at 0111. Medication was effective as patient was sleeping at time of reassessment. Patient reports 5/10 pain radiating from right side of chest/back towards right arm. Pain worsens with cough. Patient refuses intervention saying even though it is a 5/10 this is the best it has felt in a while so I can handle this without any medicine. Patient used 2L via nasal canula to help maintain oxygen saturation during the night. Patient remained afebrile during this shift. Will continue POC.     Problem: Infection, Risk/Actual (Adult)  Intervention: Manage Suspected/Actual Infection   11/20/17 0320   Interventions   Infection Management aseptic technique maintained     Intervention: Prevent Infection/Maximize Resistance   11/20/17 0320   Interventions   Oral Nutrition Promotion calorie dense foods provided;calorie dense liquids provided;rest periods promoted   Sleep/Rest Enhancement awakenings minimized;regular sleep/rest pattern promoted       Goal: Infection Prevention/Resolution  Patient will demonstrate the desired outcomes by discharge/transition of care.   Outcome: Progressing   11/20/17 0320   Infection, Risk/Actual (Adult)   Infection Prevention/Resolution making progress toward outcome       Problem: VTE, DVT and PE (Adult)  Intervention: Prevent/Manage Thrombi/Emboli   11/20/17 0320   Interventions   VTE Prevention/Management ambulation promoted   Bleeding Precautions blood pressure closely monitored     Intervention: Monitor/Manage Pain   11/20/17 0320   Interventions   Medication Review/Management medications reviewed   Pain Management Interventions care clustered;medication offered but refused;pain management plan reviewed with patient/caregiver       Goal: Signs and Symptoms of Listed Potential Problems Will be Absent, Minimized or Managed (VTE, DVT and PE)  Signs and symptoms of listed potential problems will be absent, minimized or managed by discharge/transition of care (reference VTE, DVT and PE (Adult) CPG).   Outcome: Progressing   11/20/17 0320   VTE, DVT and PE (Adult)   Problems Assessed (VTE, DVT, PE) all   Problems Present (VTE, DVT, PE) none       Problem: Breathing Pattern Ineffective (Adult)  Intervention: Optimize Oxygenation/Ventilation/Perfusion   11/20/17 0320   Interventions   Breathing Techniques/Airway Clearance deep/controlled cough encouraged   Head of Bed (HOB) HOB elevated     Intervention: Minimize Oxygen Consumption/Demand   11/20/17 0320   Interventions   Activity Management activity adjusted per tolerance;ambulated in hall;ambulated to bathroom;ambulated in room   Environmental Support calm environment promoted;rest periods encouraged;personal routine supported   Sleep/Rest Enhancement awakenings minimized;regular sleep/rest pattern promoted       Goal: Effective Oxygenation/Ventilation  Patient will demonstrate the desired outcomes by discharge/transition of care.   Outcome: Progressing   11/20/17 0320   Breathing Pattern Ineffective (Adult)   Effective Oxygenation/Ventilation making progress toward outcome       Problem: Pain, Chronic (Adult)  Intervention: Manage Persistent Pain Analgesia   11/20/17 0320   Interventions   Bowel Intervention adequate fluid intake promoted;ambulation promoted   Medication Review/Management medications reviewed     Intervention: Support Psychosocial Response to Persistent Pain   11/20/17 0320   Interventions   Supportive Measures active listening utilized;positive  reinforcement provided;decision-making supported;self-care encouraged       Goal: Identify Related Risk Factors and Signs and Symptoms  Related risk factors and signs and symptoms are identified upon initiation of Human Response Clinical Practice Guideline (CPG).   Outcome: Progressing   11/20/17 0320   Pain, Chronic (Adult)   Related Risk Factors (Chronic Pain) disease process     Goal: Acceptable Pain/Comfort Level and Functional Ability  Patient will demonstrate the desired outcomes by discharge/transition of care.   Outcome: Progressing   11/20/17 0320   Pain, Chronic (Adult)   Acceptable Pain/Comfort Level and Functional Ability making progress toward outcome

## 2017-11-20 NOTE — Unmapped (Addendum)
Problem: Patient Care Overview  Goal: Plan of Care Review  Outcome: Progressing  Pt Alert and Oriented. Pt was febrile - gave PRN Tylenol - made team aware. On RA most of day with O2 sats above O2 sats WNL. Worked with OT and PT. Pt ate a burrito bowl and chocolate milk entire shift.  Free from falls and injuries.  Ambulated in hallways. Scheduled Medications given per MD orders.  Patient rested comfortably. Will continue to monitor.     Goal: Individualization and Mutuality  Outcome: Progressing    Goal: Discharge Needs Assessment  Outcome: Progressing    Goal: Interprofessional Rounds/Family Conf  Outcome: Progressing      Problem: Infection, Risk/Actual (Adult)  Goal: Infection Prevention/Resolution  Patient will demonstrate the desired outcomes by discharge/transition of care.   Outcome: Progressing      Problem: VTE, DVT and PE (Adult)  Intervention: Prevent/Manage Thrombi/Emboli  Refused Lovenox    Goal: Signs and Symptoms of Listed Potential Problems Will be Absent, Minimized or Managed (VTE, DVT and PE)  Signs and symptoms of listed potential problems will be absent, minimized or managed by discharge/transition of care (reference VTE, DVT and PE (Adult) CPG).   Outcome: Progressing      Problem: Breathing Pattern Ineffective (Adult)  Goal: Effective Oxygenation/Ventilation  Patient will demonstrate the desired outcomes by discharge/transition of care.   Outcome: Progressing      Problem: Pain, Chronic (Adult)  Goal: Identify Related Risk Factors and Signs and Symptoms  Related risk factors and signs and symptoms are identified upon initiation of Human Response Clinical Practice Guideline (CPG).   Outcome: Progressing    Goal: Acceptable Pain/Comfort Level and Functional Ability  Patient will demonstrate the desired outcomes by discharge/transition of care.   Outcome: Progressing

## 2017-11-20 NOTE — Unmapped (Signed)
OCCUPATIONAL THERAPY  Evaluation (11/20/17 1338)    Patient Name:  Mallory [name]       Medical Record Number: [id number]   Date of Birth: [birth year]  Sex: Female          OT Treatment Diagnosis:  OT consulted for decreased ADL    Assessment  Pt Myers is a 61 y.o. female with PMHx of COPD, bronchiectasis, and M kansasii s/p resection, right upper lobectomy and superior RLL segmentectomy (2013) who presented to [facility] with Fever and cough. Pt presents to OT eval near baseline, I/Mod I for BADL and functional mobility. Pt reports no falls recently, but a few near falls where she was able to catch herself. Pt receptive to education on pacing and energy conservation for improved functional activity tolerance. Pt reports that her sister is at home with her and can provide assistance as needed. Pt indicates that she has lots of DME at home from when her mother lived with her including a walker, multiple shower chair/bench options, and a bedside commode. OT evaluation completed with moderate complexity given pt's medical history, performance limitations, and clinical analysis required. Based on the daily activity AM-PAC raw score of 23/24, the pt is considered to be 15.86% impaired with self care. This indicates no skilled acute OT needs at this time, recommending 3x weekly HH post-acute.     Activity Tolerance During Today's Session  Patient tolerated treatment well    Plan  Planned Frequency of Treatment:  D/C Services for: D/C Services     Post-Discharge Occupational Therapy Recommendations:  OT Post Acute Discharge Recommendations: 3x weekly    ;    OT DME Recommendations: None    Prognosis:  Good  Positive Indicators:  PLOF, CLOF, motivation  Barriers to Discharge: None    Subjective  Current Status Pt received semi seated in bed, left seated EOB with call bell.   Prior Functional Status Pt reports independence with BADL, driving, working in HR full time. Enjoys cooking.       Services patient receives: PT;OT Patient / Caregiver reports: I have been sick for about a month now.    Past Medical History:   Diagnosis Date   ??? Anxiety 10/17/2013   ??? Asthma 12/30/2006    Overview:  Qualifier: Diagnosis of  By: [name], [name].   Last Assessment & Plan:  Asx, lung exam today ok, cutting down tobacco    ??? Bronchiectasis (CMS-HCC) 12/24/2007    Overview:  Followed in Pulmonary clinic/ [facility]/ [facility]     - See CT 11/10/2005 RLL changes    - New changes RUL CT Chest  12/29/2011 ? All related to bronchiectasis >FOB 02/15/12 > neg cyt/ neg tbbx/ no wbc's on gm stain ? Afb/fungal studies done as requested    - 02/29/12 Rec PET Scan > pt declined > 03/05/2012 rec t surgery eval for RUL obectomy done 11/06/12 > Pos M Kansasii> f/u ID     - a   ??? Dislocation of shoulder region 10/17/2013   ??? Endometriosis 12/30/2006    Overview:  Qualifier: Diagnosis of  By: [name], Jose E.    ??? Gastroesophageal reflux disease 12/30/2006    Overview:  Qualifier: Diagnosis of  By: [name], [name].   Last Assessment & Plan:  Symptoms relatively well controlled with PPIs twice a day    ??? Hemoptysis    ??? Impaired glucose tolerance 04/02/2014    Last Assessment & Plan:  A1c 08-2013 was 5.9.  Patient aware, recheck her A1c    ??? 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 s/p right partial lobectomy   ??? Insomnia 11/22/2010    Overview:  Qualifier: Diagnosis of  By: [name], [name].   Last Assessment & Plan:  Well-controlled with alprazolam    ??? Intertrochanteric fracture (CMS-HCC) 10/17/2013    Last Assessment & Plan:  Status post left hip injury  10/2013, doing outpatient PT.    ??? Lichen planus 12/30/2006    Overview:  Annotation: s/p mouth BX Qualifier: Diagnosis of  By: [name], Jose E.    ??? Liver cyst 12/24/2007   ??? Osteoarthritis 12/30/2006    Overview:  Qualifier: Diagnosis of  By: [name], [name].   Last Assessment & Plan:  On hydrocodone, needs a refill. UDS low 08-2013    ??? Osteoporosis 12/30/2006 Overview:  Dx at age 26, multiple DEXAs in the past at gyn, last DEXA? Intolerant to fosamax reclast was denied in the past   Last Assessment & Plan:  Not recent bone density test, intolerant to Fosamax, insurance denied  reclast  before. Plan: Bone density test,vit d, ca and vit po supplements     ??? Spontaneous pneumothorax     x2 in her 50s, had chest tube for 1   ??? Tobacco dependence syndrome 12/24/2007    Overview:  Limits of effective care disussed in pulmonary clinic/ [facility]/ 01/20/12  Last Assessment & Plan:  Today she is again counseled, I discussed Wellbutrin, Chantix. She reports that in the past she got very aggressive while taking Chantix    ??? Vascular insufficiency of intestine (CMS-HCC) 12/24/2007    Overview:  Qualifier: Diagnosis of  By: Koleen Distance CMA Duncan Dull), Leisha     ??? Vitamin B-complex deficiency 12/30/2006    Overview:  Mild on oral supplements   Last Assessment & Plan:  B 12 levels  consistently normal     Social History   Substance Use Topics   ??? Smoking status: Current Every Day Smoker     Packs/day: 1.00     Types: Cigarettes   ??? Smokeless tobacco: Never Used   ??? Alcohol use 4.2 oz/week     7 Glasses of wine per week      Past Surgical History:   Procedure Laterality Date   ??? BRONCHOSCOPY  02/15/12   ??? ESOPHAGOGASTRODUODENOSCOPY  12/03/12   ??? FLEXIBLE BRONCHOSCOPY  11/05/12   ??? KNEE SURGERY     ??? LUNG SURGERY  11/18/12    Right upper Lobe, Superior Segmentectomy   ??? MEDIASTINOSCOPY  05/25/12   ??? OVARIAN CYST REMOVAL     ??? PR BRONCHOSCOPY,DIAGNOSTIC N/A 11/03/2017    Procedure: Bronchoscopy, Rigid Or Flexible, W/Wo Fluoroscopic Guidance; Diagnostic, With Cell Washing, When Performed;  Surgeon: Sherwood Gambler, MD;  Location: MAIN OR [facility];  Service: Pulmonary   ??? PR BRONCHOSCOPY,DIAGNOSTIC W LAVAGE Bilateral 11/18/2017    Procedure: BRONCHOSCOPY, RIGID OR FLEXIBLE, INCLUDE FLUOROSCOPIC GUIDANCE WHEN PERFORMED; W/BRONCHIAL ALVEOLAR LAVAGE WITH MODERATE SEDATION;  Surgeon: Jhonnie Garner. Lurena Nida, MD; Location: BRONCH PROCEDURE LAB [facility];  Service: Pulmonary   ??? THORACOTOMY Right 11/05/12   ??? TONSILLECTOMY      Family History   Problem Relation Age of Onset   ??? Heart disease Maternal Grandfather    ??? Multiple sclerosis Sister    ??? Stroke Maternal Grandmother    ??? Cancer Paternal Grandmother    ??? Diabetes Mother    ???  Heart disease Mother    ??? Dementia Mother    ??? Cancer Father         sarcoma        Diphenhydramine; Pseudoephedrine hcl; Levofloxacin; Alendronate sodium; Diphenhydramine hcl; Pheniramine; Pheniramine maleate; Pseudoephedrine; Tolmetin; and Codeine-guaifenesin     Objective Findings  Precautions / Restrictions  Falls precautions    Weight Bearing  Non-applicable    Required Braces or Orthoses  Non-applicable    Communication Preference  Verbal    Pain  No c/o pain    Equipment / Environment  Vascular access (PIV, TLC, Port-a-cath, PICC)    Living Situation   Living environment: House   Lives With: Sibling(s)   Home Living: Two level home;Able to Live on main level with bedroom/bathroom;Stairs to enter without rails;Tub/shower unit;Bedside commode   Equipment available at home: Tub bench;Shower Programmer, multimedia   Orientation Level:  Oriented x 4   Arousal/Alertness:  Appropriate responses to stimuli   Attention Span:  Appears intact   Memory:  Appears intact   Following Commands:  Follows all commands and directions without difficulty   Safety Judgment:  Good awareness of safety precautions   Awareness of Errors:  Good awareness of safety precautions   Problem Solving:  Able to problem solve independently   Comments:      Vision / Perception  Vision: Wears glasses all the time  Perception: grossly intact       Hand Function  Hand Dominance: Right  WFL    Skin Inspection  grossly intact    ROM / Strength/Coordination  UE ROM/ Strength/ Coordination: WFL- pt reports hx of sublux of R shoulder and hx of PT following sublux for strengthening  LE ROM/ Strength/ Coordination: WFL Sensation:  Pt reports occassional numbness on posterior RUE above elbow down to ribs 2/2 previous lung surgery    Balance:  Independent for dynamic sitting balance and dynamic standing balance, no AD    Mobility/Gait/Transfers: Independent for semiseated<> EOB, Independent for sit to stand transfer and functional mobility in room    ADL:  Bathing: Supervision  Grooming: Pt washed hands standing sinkside independent  Dressing: Pt independent to adjust B socks seated EOB  Eating: Independent  Toileting: Mod I for toilet transfer with use of grab bar (toilet much lower here vs at home) and I for hygiene        Vitals/ Orthostatics:  At Rest: NAD  With Activity: NAD       Interventions Performed During Today's Session: AMPAC 23/24; Pt edu on OT role, POC, provided extensive edu on energy conservation techniques (seated ADL, seated meal prep, frequent rest breaks), pacing with functional mobility for decreased risk of fall, wrapping options for showering with PICC line in case pt needs to DC home with it, importance of OOB activity and upright positioning for improved lung functioning, alternating periods of rest with periods of activity for improved functional activity tolerance, importance of rest, option/benefits of home health OT, showering with door open with hot showers to decrease fatigue/dizziness and improved breathing, facilitated bed mobility, sit to stand transfer, functional mobility in room, toilet transfer, toileting, and grooming.     OT G-codes  Functional Assessment Tool Used: AMPAC  Score: 23/24  Functional Limitation: Self care  Self Care Current Status (N5621): At least 1 percent but less than 20 percent impaired, limited or restricted  Self Care Goal Status (H0865): At least 1 percent but less than 20 percent impaired,  limited or restricted  Self Care Discharge Status [phone]): At least 1 percent but less than 20 percent impaired, limited or restricted  Rationale: 15.86% impaired self care    Eval Duration (OT): 57 Min.    Medical Staff Made Aware: Victorino Dike RN    I attest that I have reviewed the above information.  Signed: Jamika Sadek A Ward, OT  Ceasar Mons 11/20/2017

## 2017-11-21 LAB — CBC W/ AUTO DIFF
BASOPHILS ABSOLUTE COUNT: 0 10*9/L (ref 0.0–0.1)
EOSINOPHILS ABSOLUTE COUNT: 0.4 10*9/L (ref 0.0–0.4)
HEMATOCRIT: 26.1 % — ABNORMAL LOW (ref 36.0–46.0)
HEMOGLOBIN: 8.1 g/dL — ABNORMAL LOW (ref 12.0–16.0)
LARGE UNSTAINED CELLS: 3 % (ref 0–4)
LYMPHOCYTES ABSOLUTE COUNT: 1.3 10*9/L — ABNORMAL LOW (ref 1.5–5.0)
MEAN CORPUSCULAR HEMOGLOBIN CONC: 31.1 g/dL (ref 31.0–37.0)
MEAN CORPUSCULAR HEMOGLOBIN: 30.9 pg (ref 26.0–34.0)
MEAN CORPUSCULAR VOLUME: 99.4 fL (ref 80.0–100.0)
MEAN PLATELET VOLUME: 7.5 fL (ref 7.0–10.0)
NEUTROPHILS ABSOLUTE COUNT: 8.8 10*9/L — ABNORMAL HIGH (ref 2.0–7.5)
PLATELET COUNT: 824 10*9/L — ABNORMAL HIGH (ref 150–440)
RED CELL DISTRIBUTION WIDTH: 14.3 % (ref 12.0–15.0)
WBC ADJUSTED: 11.6 10*9/L — ABNORMAL HIGH (ref 4.5–11.0)

## 2017-11-21 LAB — PHOSPHORUS: Phosphate:MCnc:Pt:Ser/Plas:Qn:: 4.3

## 2017-11-21 LAB — RED BLOOD CELL COUNT: Lab: 2.62 — ABNORMAL LOW

## 2017-11-21 LAB — VORICONAZOLE LEVEL: Lab: 9.6 — ABNORMAL HIGH

## 2017-11-21 LAB — BASIC METABOLIC PANEL
ANION GAP: 11 mmol/L (ref 9–15)
BUN / CREAT RATIO: 8
CALCIUM: 8.8 mg/dL (ref 8.5–10.2)
CHLORIDE: 97 mmol/L — ABNORMAL LOW (ref 98–107)
CO2: 29 mmol/L (ref 22.0–30.0)
CREATININE: 0.52 mg/dL — ABNORMAL LOW (ref 0.60–1.00)
EGFR MDRD NON AF AMER: >=60 mL/min/{1.73_m2} (ref >=60)
GLUCOSE RANDOM: 93 mg/dL (ref 65–179)
POTASSIUM: 4.3 mmol/L (ref 3.5–5.0)
SODIUM: 137 mmol/L (ref 135–145)

## 2017-11-21 LAB — MAGNESIUM: Magnesium:MCnc:Pt:Ser/Plas:Qn:: 1.7

## 2017-11-21 LAB — SODIUM: Sodium:SCnc:Pt:Ser/Plas:Qn:: 137

## 2017-11-21 NOTE — Unmapped (Signed)
GENERAL INFECTIOUS DISEASES TEAM B CONSULT NOTE  ??  ??  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 AFB smears of multiple respiratory samples have been negatives, making it less likely that she has a mycobacterial infection. She had repeat bronch on  11/18/17 and BAL studies are pending. Fever is trending down and she is feeling better. She continues to have visual symptoms and vori level came back higher yesterday, so it is being held until repeat level on 12/19  ??  ID Problem List:  1. 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 Hx of pulmonary M.kansasii infection in 2013 s/p RUL lobectomy and antimicrobial therapy with multiple negative AFB sputum cultures suspequently  3. COPD/bronchiectasis  ??  ??  Recommendations:  1. 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 She continues to have visual symptoms  2. Follow up BAL studies from  Bronch done on 12/15. Follow up positive sputum cx for mould from 11/13/17  ??  ??  Thank you for involving [place] in the care of this patient. The General ID service Team B will continue to follow.   Please call the General B pager with questions at [phone].   ??  Livan Hires MD  ??  ??  Interval events and ROS:  Feeling better overall, other than her visual symptoms which is about the same. Less cough. No sob.  ??  Allergies:  Diphenhydramine; Pseudoephedrine hcl; Levofloxacin; Alendronate sodium; Diphenhydramine hcl; Pheniramine; Pheniramine maleate; Pseudoephedrine; Tolmetin; and Codeine-guaifenesin  ??  ??  Medications:??  Current antimicrobials:  Voriconazole (12/12-) (being held currently due to high levels)  ??  Previous antimicrobials:  Unasyn  ??Meropenem (12/2-12/17)  ??  Other medications reviewed.   ??  ??  Objective:??  Vitals:    11/21/17 1333   BP: 119/69   Pulse: 101   Resp: 20   Temp: 37.5 ??C (99.5 ??F)   SpO2: 93%       Physical exam   General: in no acute distress  HEENT: anicteric conjunctivae,normal extraoccular muscle movements  LUNGS: normal work of breathing, no crackles  ABDOMINAL:??soft, no tenderness  MSK: no edema, no joint effusions   SKIN: no rash or skin breakdown  NEUROLOGICAL: alert and oriented x3  PSYCH: interactive  LINES: appear non-infected  ??  ??  Labs:????  Lab Results   Component Value Date    WBC 11.6 (H) 11/21/2017    HGB 8.1 (L) 11/21/2017    HCT 26.1 (L) 11/21/2017    PLT 824 (H) 11/21/2017       Lab Results   Component Value Date    NA 137 11/21/2017    K 4.3 11/21/2017    CL 97 (L) 11/21/2017    CO2 29.0 11/21/2017    BUN 4 (L) 11/21/2017    CREATININE 0.52 (L) 11/21/2017  GLU 93 11/21/2017    CALCIUM 8.8 11/21/2017    MG 1.7 11/21/2017    PHOS 4.3 11/21/2017       Lab Results   Component Value Date    BILITOT 0.5 11/20/2017    BILIDIR 0.50 (H) 11/20/2017    PROT 5.6 (L) 11/20/2017    ALBUMIN 2.8 (L) 11/20/2017    ALT 28 11/20/2017    AST 28 11/20/2017    ALKPHOS 102 11/20/2017       Lab Results   Component Value Date    INR 1.26 11/02/2017    APTT 31.7 10/31/2017     ??  Drug monitoring:  Lab Results   Component Value Date    VANCOTROUGH 9.4 (L) 11/10/2017    VORILVL 9.6 (H) 11/20/2017    VORILVL 7.5 (H) 11/17/2017     ??  Microbiology:    Bacteria:  Lab Results   Component Value Date/Time    LABGRAM 1+ Polymorphonuclear leukocytes 11/18/2017 09:58 AM    LABGRAM No organisms seen 11/18/2017 09:58 AM     Lab Results   Component Value Date/Time    LABBLOO No Growth at 5 days 11/16/2017 02:51 AM    LABBLOO No Growth at 5 days 11/13/2017 12:52 AM    LABBLOO No Growth at 5 days 11/13/2017 12:52 AM    LABBLOO No Growth at 5 days 11/08/2017 08:47 PM    LABBLOO No Growth at 5 days 11/08/2017 08:26 PM    LABBLOO No Growth at 5 days 11/08/2017 08:26 PM     Lab Results   Component Value Date/Time    LABURIN NO GROWTH 11/08/2017 09:06 PM       Lab Results   Component Value Date/Time    [facility]  11/18/2017 09:57 AM     NO ACID FAST BACILLI SEEN- 3 negative smears do not exclude pulmonary TB. If active pulmonary TB is suspected, continue airborne isolation until pulmonary disease is excluded by negative cultures.    [facility]  11/03/2017 11:52 AM     NO ACID FAST BACILLI SEEN- 3 negative smears do not exclude pulmonary TB. If active pulmonary TB is suspected, continue airborne isolation until pulmonary disease is excluded by negative cultures.    [facility]  11/01/2017 03:18 PM     NO ACID FAST BACILLI SEEN- 3 negative smears do not exclude pulmonary TB. If active pulmonary TB is suspected, continue airborne isolation until pulmonary disease is excluded by negative cultures.    [facility]  11/01/2017 05:31 AM     NO ACID FAST BACILLI SEEN- 3 negative smears do not exclude pulmonary TB. If active pulmonary TB is suspected, continue airborne isolation until pulmonary disease is excluded by negative cultures.    [facility]  10/31/2017 08:38 PM     NO ACID FAST BACILLI SEEN- 3 negative smears do not exclude pulmonary TB. If active pulmonary TB is suspected, continue airborne isolation until pulmonary disease is excluded by negative cultures.    AFBCX NEGATIVE TO DATE 11/03/2017 11:52 AM    AFBCX NEGATIVE TO DATE 11/01/2017 03:18 PM    AFBCX NEGATIVE TO DATE 11/01/2017 05:31 AM    AFBCX NEGATIVE TO DATE 10/31/2017 08:38 PM    AFBCX No Acid Fast Bacilli Detected 09/12/2017 03:48 PM     Lab Results   Component Value Date/Time    QBC NO GROWTH TO DATE 11/18/2017 09:58 AM    LRGS 2+ Polymorphonuclear leukocytes 11/18/2017 09:58 AM    LRGS No organisms seen 11/18/2017 09:58 AM  LRGS >25 PMNS/LPF 11/13/2017 05:32 AM    LRGS <10  Epithelial cells/LPF 11/13/2017 05:32 AM    LRGS Probable 1+ Gram positive cocci 11/13/2017 05:32 AM    LRGS Acceptable for culture 11/13/2017 05:32 AM    LABLORES NO GROWTH TO DATE 11/18/2017 09:58 AM    LABLORES Oropharyngeal Flora Isolated 11/13/2017 05:32 AM    LABLORES Mould--Identification To Follow (A) 11/13/2017 05:32 AM    LABLORES OROPHARYNGEAL FLORA ISOLATED 11/08/2017 09:06 PM    LABLORES Aspergillus fumigatus (A) 11/03/2017 11:52 AM    LABLORES Edited (A) 11/03/2017 11:52 AM    LEGCX Culture in Progress 11/18/2017 09:58 AM       Studies:??  Ct Chest Wo Contrast  ??  Result Date: 11/16/2017  EXAM: CT CHEST WO CONTRAST DATE: 11/16/2017 4:03 PM ACCESSION: [id number] UN DICTATED: 11/16/2017 4:08 PM INTERPRETATION LOCATION: [facility]   ??  CLINICAL INDICATION: 61 years old Female with Other-follow-up chronic pneumonia- ??  ??  COMPARISON: Chest CT 11/05/2017 TECHNIQUE: A spiral CT scan was obtained without IV contrast from the thoracic inlet through the hemidiaphragms. Images were reconstructed in the axial plane. ??Coronal and sagittal reformatted images of the chest were also provided for further evaluation of the lung parenchyma.   ??  FINDINGS: Left PICC terminates in right atrium.   ??  AIRWAYS, LUNGS, PLEURA: Emphysematous changes. Post right upper lobectomy. Right apical thick-walled cystic lesion similar to prior. Slight interval worsening in consolidation of the superior aspect of the right lower lobe. Short interval increase in patchy airspace opacities with adjacent groundglass and asymmetric intralobular septal thickening in the base of the right lower lobe. New foci of endobronchial opacification in right lower lobe.   ??  MEDIASTINUM: Normal heart size. No pericardial effusion. Normal caliber thoracic aorta. Aortic and coronary artery calcifications.   ??  Right paratracheal lymphadenopathy, with increase in right superior paratracheal region, up to 1.1 cm, previously subcentimeter, (2:31.   ??  IMAGED ABDOMEN: Unremarkable.   ??  SOFT TISSUES: Unremarkable.   ??  BONES: Unchanged chronic changes in the right lateral second and sixth ribs. 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   ??  ??  ??  --Short interval increase in patchy consolidations, groundglass opacities, and asymmetric interstitial opacities in the right lung base could reflect worsening multifocal pneumonia, superimposed acute aspiration (particularly given new RLL endobronchial opacification), and/or asymmetric edema.   ??  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 Attention on follow-up.   ??  --Stable to slight increase in consolidative opacities in the upper portion of the right lower lobe.

## 2017-11-21 NOTE — Unmapped (Signed)
Patient was compliant with all inhaled scheduled medications and was under no apparent distress.

## 2017-11-21 NOTE — Unmapped (Signed)
Med G Daily Progress Note      Interval History:  Febrile to 38 overnight, received Tylenol. Continues to have visual symptoms from voriconazole, but have not worsened.    Assessment/Plan:    Principal Problem:    Fever  Active Problems:    Tobacco use disorder    Bronchiectasis without complication (RAF-HCC)    History of surgical procedure    COPD (chronic obstructive pulmonary disease) (CMS-HCC)    Post-thoracotomy pain syndrome    Cough    History of Mycobacterium kansasii infection  Resolved Problems:    * No resolved hospital problems. *    Mallory [name] is a 61 year old woman with a PMH of COPD, bronchiectasis, and M kansasii infection s/p resection, RULectomy and superior RLL segmentectomy (2013) who presented to [facility] on 10/31/2017 with fever and cough, found to have RML consolidation, concerning for infection. 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  ??  RML infection  Rigid bronch 11/30 performed without fistula visualized (though cannot rule out microscopic fistula). Continues to fever to above 39 every night despite being on IV meropenem. Lower respiratory cultures have grown single colonies of Aspergillus, which were not thought to be significant, and galactomannan was negative. However, because of her persistent fevers, she is being empirically treated with voriconazole. CT chest demonstrated interval increase in patchy consolidations, GGO, and asymmetric interstitial opacities in the right lung base, as well as increase in upper RLL opacities. Underwent bronch w/ BAL on 12/15 in order to get a better sample and determine which organism to treat for. Leukocytosis improved to 10.1, although CRP still elevated at 253. Will decrease voriconazole dose given elevated trough and visual symptoms. Will also stop IV meropenem, especially with the possibility of it causing a drug fever.  - PO voriconazole 100 mg BID (12/12 - ), but holding until levels are within normal limits  - follow up fungitell assay  - follow up BAL studies  - airway clearance w/ HTS 3% nebs, Aerobika, and chest vest    Thrombocytosis  Improved to 700s, may be a sign of improving inflammation.  - daily CBC    Chronic post-thoracotomy pain syndrome  Chronic pain was consulted and provided recommendations.  - Tylenol 1000 mg q6h PRN  - oxycodone 5-10 mg q6h PRN  - tizanidine 2-4 mg TID PRN  - lidocaine patches    Iron deficiency  Iron 17, transferrin 137.6, and ferritin only 180, so likely iron deficient. Received IV iron 125 mg daily for 5 days (12/7 - 12/11).    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  COPD  - albuterol inhaler PRN  - Flovent  ??  Tobacco dependence  - nicotine lozenges    FEN/GI/DVT  - GI: home PPI  - DVT: Lovenox 40 mg daily  ??  Code status: DNR and DNI; confirmed with patient on admission    Patient has Advanced Directive with [name]  Healthcare POA: [name] [phone])     Dispo: Med G, floor  __________________________________________________________    Labs/Studies:  Labs and Studies from the last 24hrs per EMR and Reviewed    Objective:  Temp:  [36.8 ??C-38.3 ??C] 37.2 ??C  Heart Rate:  [63-99] 89  Resp:  [17-28] 28  BP: (90-109)/(53-68) 103/68  SpO2:  [89 %-100 %] 99 %,     Intake/Output Summary (Last 24 hours) at 11/20/17 1806  Last data  filed at 11/20/17 0700   Gross per 24 hour   Intake             1000 ml   Output                0 ml   Net             1000 ml        Gen: sitting up in bed, NAD  CV: tachycardic and regualar, no murmurs  Pulm: clear on the left, diminished on the right, crackles in the right lower field  Abd: soft, NT, ND  Extr: WWP, no tenderness of edema of lower extremities; 4 small erythematous papules on lateral aspect of right knee

## 2017-11-21 NOTE — Unmapped (Signed)
Problem: Patient Care Overview  Goal: Plan of Care Review  Outcome: Progressing    Goal: Individualization and Mutuality  Outcome: Progressing    Goal: Interprofessional Rounds/Family Conf  Outcome: Progressing    Tylenol given for temp 38.1. Pain managed with PRN meds. See MAR. Tele called to notify SpO2 down to 84. RN to bedside and applied 2L O2 [place]. SpO2 increased >92%. After 15 minutes, RN decreased O2 to 1L. SpO2 continued >92%. Pt ambulated in hallway. Will continue POC.

## 2017-11-21 NOTE — Unmapped (Addendum)
Pt Alert and Oriented. VSS, Afebrile, and on RA. Intermittantly placed on 2 Liters due to de sating.  Ambulated Independently. Scheduled Medications given per MD orders. Continued to have blurry vision. Pt order half of sub and ate 100% of meal.Patient rested comfortably. Will continue to monitor.         Problem: Patient Care Overview  Goal: Plan of Care Review  Outcome: Progressing    Goal: Individualization and Mutuality  Outcome: Progressing    Goal: Discharge Needs Assessment  Outcome: Progressing    Goal: Interprofessional Rounds/Family Conf  Outcome: Progressing      Problem: Infection, Risk/Actual (Adult)  Goal: Infection Prevention/Resolution  Patient will demonstrate the desired outcomes by discharge/transition of care.   Outcome: Progressing      Problem: VTE, DVT and PE (Adult)  Goal: Signs and Symptoms of Listed Potential Problems Will be Absent, Minimized or Managed (VTE, DVT and PE)  Signs and symptoms of listed potential problems will be absent, minimized or managed by discharge/transition of care (reference VTE, DVT and PE (Adult) CPG).   Outcome: Progressing      Problem: Breathing Pattern Ineffective (Adult)  Goal: Effective Oxygenation/Ventilation  Patient will demonstrate the desired outcomes by discharge/transition of care.   Outcome: Progressing      Problem: Pain, Chronic (Adult)  Goal: Identify Related Risk Factors and Signs and Symptoms  Related risk factors and signs and symptoms are identified upon initiation of Human Response Clinical Practice Guideline (CPG).   Outcome: Progressing    Goal: Acceptable Pain/Comfort Level and Functional Ability  Patient will demonstrate the desired outcomes by discharge/transition of care.   Outcome: Progressing

## 2017-11-21 NOTE — Unmapped (Signed)
Pharmacy Note: Voriconazole Dosing / Monitoring  Mallory [name] 61 y.o. 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  Goal voriconazole level: 1-5.5 mcg/mL  Lab runs test every M-W-F, samples must be in lab by 1pm,     Initial dose: 200 mg PO BID  (11/15/17  ~ 4.3mg /kg, 46.8kg)  *No Load   12/12 am - 12/16 am  100mg  (12/16 pm till 12/17am)    Patient experiencing significant visual changes, vivid dreams    Resulted level   12/16 level from 12/14 2103 = 7.5 mcg/mL  (true trough)  12/18 level from 12/17 0608 = 9.51mcg/mL     Recommendation:  1. HOLD further dosing of voriconazole till next level 12/19   2. Voriconazole level w/ AM labs on 12/19  3. Continue to monitor LFT's, patients signs/symptoms     Pharmacist will continue to monitor levels.  Patient will be followed for changes in hepatic function, toxicity, efficacy, and initiation or discontinuation of interacting medications. Please page service pharmacist with questions/clarifications.    Daytona Retana, PharmD  [facility] Team Pharmacist for November 21, 2017 12:19 PM

## 2017-11-22 LAB — CBC W/ AUTO DIFF
BASOPHILS ABSOLUTE COUNT: 0.1 10*9/L (ref 0.0–0.1)
EOSINOPHILS ABSOLUTE COUNT: 0.3 10*9/L (ref 0.0–0.4)
HEMATOCRIT: 25.2 % — ABNORMAL LOW (ref 36.0–46.0)
HEMOGLOBIN: 7.8 g/dL — ABNORMAL LOW (ref 12.0–16.0)
LARGE UNSTAINED CELLS: 3 % (ref 0–4)
MEAN CORPUSCULAR HEMOGLOBIN CONC: 30.9 g/dL — ABNORMAL LOW (ref 31.0–37.0)
MEAN CORPUSCULAR HEMOGLOBIN: 30.8 pg (ref 26.0–34.0)
MEAN CORPUSCULAR VOLUME: 99.7 fL (ref 80.0–100.0)
MEAN PLATELET VOLUME: 7.4 fL (ref 7.0–10.0)
NEUTROPHILS ABSOLUTE COUNT: 6.9 10*9/L (ref 2.0–7.5)
PLATELET COUNT: 801 10*9/L — ABNORMAL HIGH (ref 150–440)
RED BLOOD CELL COUNT: 2.53 10*12/L — ABNORMAL LOW (ref 4.00–5.20)
RED CELL DISTRIBUTION WIDTH: 14.4 % (ref 12.0–15.0)
WBC ADJUSTED: 9.4 10*9/L (ref 4.5–11.0)

## 2017-11-22 LAB — BASIC METABOLIC PANEL
ANION GAP: 7 mmol/L — ABNORMAL LOW (ref 9–15)
BLOOD UREA NITROGEN: 4 mg/dL — ABNORMAL LOW (ref 7–21)
BUN / CREAT RATIO: 9
CALCIUM: 8.9 mg/dL (ref 8.5–10.2)
CHLORIDE: 101 mmol/L (ref 98–107)
CREATININE: 0.45 mg/dL — ABNORMAL LOW (ref 0.60–1.00)
EGFR MDRD AF AMER: >=60 mL/min/{1.73_m2} (ref >=60)
EGFR MDRD NON AF AMER: >=60 mL/min/{1.73_m2} (ref >=60)
GLUCOSE RANDOM: 100 mg/dL (ref 65–179)
POTASSIUM: 4 mmol/L (ref 3.5–5.0)
SODIUM: 139 mmol/L (ref 135–145)

## 2017-11-22 LAB — MAGNESIUM: Magnesium:MCnc:Pt:Ser/Plas:Qn:: 1.7

## 2017-11-22 LAB — NEUTROPHILS ABSOLUTE COUNT: Lab: 6.9

## 2017-11-22 LAB — PHOSPHORUS: Phosphate:MCnc:Pt:Ser/Plas:Qn:: 4.7

## 2017-11-22 LAB — ANION GAP: Anion gap 3:SCnc:Pt:Ser/Plas:Qn:: 7 — ABNORMAL LOW

## 2017-11-22 NOTE — Unmapped (Signed)
Problem: Patient Care Overview  Goal: Plan of Care Review  Outcome: Progressing  Patient ambulating frequently in hallway throughout the shift. Sister visited this afternoon. Patient declined lidocaine patches this morning. Oxycodone given for right chest pain.   Goal: Individualization and Mutuality  Outcome: Progressing      Problem: Infection, Risk/Actual (Adult)  Goal: Infection Prevention/Resolution  Patient will demonstrate the desired outcomes by discharge/transition of care.   Outcome: Progressing      Problem: VTE, DVT and PE (Adult)  Goal: Signs and Symptoms of Listed Potential Problems Will be Absent, Minimized or Managed (VTE, DVT and PE)  Signs and symptoms of listed potential problems will be absent, minimized or managed by discharge/transition of care (reference VTE, DVT and PE (Adult) CPG).   Outcome: Progressing      Problem: Breathing Pattern Ineffective (Adult)  Goal: Effective Oxygenation/Ventilation  Patient will demonstrate the desired outcomes by discharge/transition of care.   Outcome: Progressing      Problem: Pain, Chronic (Adult)  Goal: Acceptable Pain/Comfort Level and Functional Ability  Patient will demonstrate the desired outcomes by discharge/transition of care.   Outcome: Progressing

## 2017-11-22 NOTE — Unmapped (Addendum)
Problem: Patient Care Overview  Goal: Plan of Care Review  Outcome: Progressing  Patient had temperature of 38.5 degrees celsius this shift. Tylenol given per MAR. Otherwise, VSS on intermittent 2L oxygen via nasal canula. While not wearing oxygen, patient's O2 levels stayed between 88-92% range. Order was changed to keep O2 level over 88%. Patient happy because this has been her normal level under my care so now she can not wear oxygen. Patient ambulated in hall multiple times this shift with no complaints of SOB, fatigue, or weakness. Patient was compliant with scheduled medications. Patient received PRN xanax and oxycodone. Medication moderately effective. Patient states she hasn't slept well in a few nights and states tonight she has been in and out. Patient still having double vision. Patient eager for discharge. Will continue POC.    Problem: Infection, Risk/Actual (Adult)  Goal: Infection Prevention/Resolution  Patient will demonstrate the desired outcomes by discharge/transition of care.   Outcome: Progressing   11/22/17 0234   Infection, Risk/Actual (Adult)   Infection Prevention/Resolution making progress toward outcome       Problem: VTE, DVT and PE (Adult)  Goal: Signs and Symptoms of Listed Potential Problems Will be Absent, Minimized or Managed (VTE, DVT and PE)  Signs and symptoms of listed potential problems will be absent, minimized or managed by discharge/transition of care (reference VTE, DVT and PE (Adult) CPG).   Outcome: Progressing   11/22/17 0234   VTE, DVT and PE (Adult)   Problems Assessed (VTE, DVT, PE) all   Problems Present (VTE, DVT, PE) none       Problem: Breathing Pattern Ineffective (Adult)  Goal: Effective Oxygenation/Ventilation  Patient will demonstrate the desired outcomes by discharge/transition of care.   Outcome: Progressing   11/22/17 0234   Breathing Pattern Ineffective (Adult)   Effective Oxygenation/Ventilation making progress toward outcome       Problem: Pain, Chronic (Adult)  Goal: Identify Related Risk Factors and Signs and Symptoms  Related risk factors and signs and symptoms are identified upon initiation of Human Response Clinical Practice Guideline (CPG).   Outcome: Progressing   11/22/17 0234   Pain, Chronic (Adult)   Related Risk Factors (Chronic Pain) disease process     Goal: Acceptable Pain/Comfort Level and Functional Ability  Patient will demonstrate the desired outcomes by discharge/transition of care.   Outcome: Progressing   11/22/17 0234   Pain, Chronic (Adult)   Acceptable Pain/Comfort Level and Functional Ability making progress toward outcome

## 2017-11-22 NOTE — Unmapped (Signed)
GENERAL INFECTIOUS DISEASES TEAM B CONSULT NOTE  ??  ??  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 AFB smears of multiple respiratory samples have been negatives, making it less likely that she has a mycobacterial infection. She had repeat bronch on ??11/18/17 and BAL studies are pending, but BAL galactomannan was also negative, which makes invasive pulmonary aspergillosis less likely (it is a fairly sensitive marker). Fever has trended down overall but she still had fever last night. She is clinically stable otherwise. Visual symptoms(from vori) are better (vori is still being held due to high levels).  ??  ID Problem List:  1. 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 Hx of pulmonary M.kansasii infection in 2013 s/p RUL lobectomy and antimicrobial therapy with multiple negative AFB sputum cultures suspequently  3. COPD/bronchiectasis  ??  ??  Recommendations:  1. continue??voriconazole (being held until repeat level is checked tomorrow, given high levels) for possible invasive aspergillosis, although the negative BAL galactomannan points against that. Visual symptoms are better  2. Follow up BAL studies from ??Bronch done on 12/15. Follow up positive sputum cx for Aspergillus spp from 11/13/17  3. At this point, if she continues having fever/resp symptoms, and most recent BAL studies are not revealing, we may need to consider the possibility of a lung biopsy (and non infectious conditions such as BOOP are maybe becoming more likely)  ??  ??  Thank you for involving [place] in the care of this patient. The General ID service Team B will continue to follow.   Please call the General B pager with questions at [phone].   ??  Jazline Cumbee MD  ??  ??  Interval events and ROS:  She had fever again last night, but resolved with tylenol. Feeling well this morning. Visual changes are a little better.  ??  Allergies:  Diphenhydramine; Pseudoephedrine hcl; Levofloxacin; Alendronate sodium; Diphenhydramine hcl; Pheniramine; Pheniramine maleate; Pseudoephedrine; Tolmetin; and Codeine-guaifenesin  ??  ??  Medications:??  Current antimicrobials:  Voriconazole (12/12-) (being held currently due to high levels)  ??  Previous antimicrobials:  Unasyn  ??Meropenem (12/2-12/17)  ??  Other medications reviewed.   ??  ??  Objective:??    Vitals:    11/22/17 1000   BP: 91/64   Pulse: 81   Resp: 16   Temp: 36.8 ??C (98.2 ??F)   SpO2: 93%     Physical exam   General: in no acute distress  HEENT: anicteric conjunctivae,normal extraoccular muscle movements  LUNGS: normal work of breathing, no crackles  ABDOMINAL:??soft, no tenderness  MSK: no edema, no joint effusions   SKIN: no rash or skin breakdown  NEUROLOGICAL: alert and oriented x3  PSYCH: interactive  LINES: appear non-infected  ??  ??  Labs:????  Lab Results   Component Value Date    WBC 9.4 11/22/2017    HGB 7.8 (L) 11/22/2017  HCT 25.2 (L) 11/22/2017    PLT 801 (H) 11/22/2017       Lab Results   Component Value Date    NA 139 11/22/2017    K 4.0 11/22/2017    CL 101 11/22/2017    CO2 31.0 (H) 11/22/2017    BUN 4 (L) 11/22/2017    CREATININE 0.45 (L) 11/22/2017    GLU 100 11/22/2017    CALCIUM 8.9 11/22/2017    MG 1.7 11/22/2017    PHOS 4.7 11/22/2017       Lab Results   Component Value Date    BILITOT 0.5 11/20/2017    BILIDIR 0.50 (H) 11/20/2017    PROT 5.6 (L) 11/20/2017    ALBUMIN 2.8 (L) 11/20/2017    ALT 28 11/20/2017    AST 28 11/20/2017    ALKPHOS 102 11/20/2017     Drug monitoring:  Lab Results   Component Value Date    VANCOTROUGH 9.4 (L) 11/10/2017    VORILVL 9.6 (H) 11/20/2017    VORILVL 7.5 (H) 11/17/2017       Galactomannan (serum) <0.5 (12/11)  Galactomannan (BAL) <0.5 (12/15)  Beta D glucan <31 (12/11)    Microbiology:????  Bacteria:  Lab Results   Component Value Date/Time    LABGRAM 1+ Polymorphonuclear leukocytes 11/18/2017 09:58 AM    LABGRAM No organisms seen 11/18/2017 09:58 AM     Lab Results   Component Value Date/Time    LABBLOO No Growth at 5 days 11/16/2017 02:51 AM    LABBLOO No Growth at 5 days 11/13/2017 12:52 AM    LABBLOO No Growth at 5 days 11/13/2017 12:52 AM    LABBLOO No Growth at 5 days 11/08/2017 08:47 PM    LABBLOO No Growth at 5 days 11/08/2017 08:26 PM    LABBLOO No Growth at 5 days 11/08/2017 08:26 PM     Lab Results   Component Value Date/Time    LABURIN NO GROWTH 11/08/2017 09:06 PM       Lab Results   Component Value Date/Time    [facility]  11/18/2017 09:57 AM     NO ACID FAST BACILLI SEEN- 3 negative smears do not exclude pulmonary TB. If active pulmonary TB is suspected, continue airborne isolation until pulmonary disease is excluded by negative cultures.    [facility]  11/03/2017 11:52 AM     NO ACID FAST BACILLI SEEN- 3 negative smears do not exclude pulmonary TB. If active pulmonary TB is suspected, continue airborne isolation until pulmonary disease is excluded by negative cultures.    [facility]  11/01/2017 03:18 PM     NO ACID FAST BACILLI SEEN- 3 negative smears do not exclude pulmonary TB. If active pulmonary TB is suspected, continue airborne isolation until pulmonary disease is excluded by negative cultures.    [facility]  11/01/2017 05:31 AM     NO ACID FAST BACILLI SEEN- 3 negative smears do not exclude pulmonary TB. If active pulmonary TB is suspected, continue airborne isolation until pulmonary disease is excluded by negative cultures.    [facility]  10/31/2017 08:38 PM     NO ACID FAST BACILLI SEEN- 3 negative smears do not exclude pulmonary TB. If active pulmonary TB is suspected, continue airborne isolation until pulmonary disease is excluded by negative cultures.    AFBCX Culture in Progress 11/18/2017 09:57 AM    AFBCX NEGATIVE TO DATE 11/03/2017 11:52 AM    AFBCX NEGATIVE TO DATE 11/01/2017 03:18 PM    AFBCX NEGATIVE TO DATE 11/01/2017 05:31  AM    AFBCX NEGATIVE TO DATE 10/31/2017 08:38 PM     Lab Results   Component Value Date/Time    QBC NO GROWTH TO DATE 11/18/2017 09:58 AM    LRGS 2+ Polymorphonuclear leukocytes 11/18/2017 09:58 AM    LRGS No organisms seen 11/18/2017 09:58 AM    LRGS >25 PMNS/LPF 11/13/2017 05:32 AM    LRGS <10  Epithelial cells/LPF 11/13/2017 05:32 AM    LRGS Probable 1+ Gram positive cocci 11/13/2017 05:32 AM    LRGS Acceptable for culture 11/13/2017 05:32 AM    LABLORES NO GROWTH TO DATE 11/18/2017 09:58 AM    LABLORES Oropharyngeal Flora Isolated 11/13/2017 05:32 AM    LABLORES Aspergillus species (A) 11/13/2017 05:32 AM    LABLORES OROPHARYNGEAL FLORA ISOLATED 11/08/2017 09:06 PM    LABLORES Aspergillus fumigatus (A) 11/03/2017 11:52 AM    LABLORES Edited (A) 11/03/2017 11:52 AM    LEGCX Culture in Progress 11/18/2017 09:58 AM     Studies:??  Ct Chest Wo Contrast  ??  Result Date: 11/16/2017  EXAM: CT CHEST WO CONTRAST DATE: 11/16/2017 4:03 PM ACCESSION: [id number] UN DICTATED: 11/16/2017 4:08 PM INTERPRETATION LOCATION: [facility]   ??  CLINICAL INDICATION: 61 years old Female with Other-follow-up chronic pneumonia- ??  ??  COMPARISON: Chest CT 11/05/2017 TECHNIQUE: A spiral CT scan was obtained without IV contrast from the thoracic inlet through the hemidiaphragms. Images were reconstructed in the axial plane. ??Coronal and sagittal reformatted images of the chest were also provided for further evaluation of the lung parenchyma.   ??  FINDINGS: Left PICC terminates in right atrium.   ??  AIRWAYS, LUNGS, PLEURA: Emphysematous changes. Post right upper lobectomy. Right apical thick-walled cystic lesion similar to prior. Slight interval worsening in consolidation of the superior aspect of the right lower lobe. Short interval increase in patchy airspace opacities with adjacent groundglass and asymmetric intralobular septal thickening in the base of the right lower lobe. New foci of endobronchial opacification in right lower lobe.   ??  MEDIASTINUM: Normal heart size. No pericardial effusion. Normal caliber thoracic aorta. Aortic and coronary artery calcifications.   ??  Right paratracheal lymphadenopathy, with increase in right superior paratracheal region, up to 1.1 cm, previously subcentimeter, (2:31.   ??  IMAGED ABDOMEN: Unremarkable.   ??  SOFT TISSUES: Unremarkable.   ??  BONES: Unchanged chronic changes in the right lateral second and sixth ribs. 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   ??  ??  ??  --Short interval increase in patchy consolidations, groundglass opacities, and asymmetric interstitial opacities in the right lung base could reflect worsening multifocal pneumonia, superimposed acute aspiration (particularly given new RLL endobronchial opacification), and/or asymmetric edema.   ??  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 Attention on follow-up.   ??  --Stable to slight increase in consolidative opacities in the upper portion of the right lower lobe.

## 2017-11-22 NOTE — Unmapped (Signed)
Pharmacy Note: Voriconazole Dosing / Monitoring  Mallory [name] 61 y.o. 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  Goal voriconazole level(Trough): 1-5.5 mcg/mL  Lab runs test every M-W-F, samples must be in lab by 1pm, often reported late afternoon, early morning next day    Initial dose: 200 mg PO BID  (11/15/17  ~ 4.3mg /kg, 46.8kg)  *No Load   12/12 am - 12/16 am  100mg  (12/16 pm till 12/17am)    Patient experiencing persistent significant visual changes, vivid dreams -- most likely secondarily due to elevated concentration    Resulted level   12/16 level from 12/14 2103 = 7.5 mcg/mL  (true trough)  12/18 level from 12/17 0608 = 9.14mcg/mL   12/19 'INPROCESS as of 2:58 PM      Recommendation:  1. HOLD further dosing of voriconazole till next level 12/19  -- in process   2. If Voriconazole level is < 5.5ng/mL -- restart voriconazole at 100mg  BID 1st dose tonight      If level > 5.5 - order level for 12/20(Friday) AM labs  3. Continue to monitor LFT's, patients signs/symptoms     Pharmacist will continue to monitor levels.  Patient will be followed for changes in hepatic function, toxicity, efficacy, and initiation or discontinuation of interacting medications. Please page service pharmacist with questions/clarifications.    Merced Brougham, PharmD  MDG Team Pharmacist for November 22, 2017 2:51 PM

## 2017-11-22 NOTE — Unmapped (Signed)
Med G Daily Progress Note      Interval History:  Febrile to 38.5 overnight, received Tylenol. Visual symptoms have improved. Not sleeping well, but ambulating around the floor fine.    Assessment/Plan:    Principal Problem:    Fever  Active Problems:    Tobacco use disorder    Bronchiectasis without complication (RAF-HCC)    History of surgical procedure    COPD (chronic obstructive pulmonary disease) (CMS-HCC)    Post-thoracotomy pain syndrome    Cough    History of Mycobacterium kansasii infection  Resolved Problems:    * No resolved hospital problems. *    Mallory [name] is a 61 year old woman with a PMH of COPD, bronchiectasis, and M kansasii infection s/p resection, RULectomy and superior RLL segmentectomy (2013) who presented to [facility] on 10/31/2017 with fever and cough, found to have RML consolidation, concerning for infection, although no definitive organism has been identified, and she continues to be febrile despite anti-bacterial and anti-fungal agents. May need to consider malignant or rheumatologic causes.  ??  Fever, likely due to RML and RLL pneumonia  Rigid bronch 11/30 performed without fistula visualized (though cannot rule out microscopic fistula). Continues to fever to above 39 every night despite being on IV meropenem. Lower respiratory cultures have grown single colonies of Aspergillus, which were not thought to be significant, and galactomannan was negative. However, because of her persistent fevers, she is being empirically treated with voriconazole. CT chest demonstrated interval increase in patchy consolidations, GGO, and asymmetric interstitial opacities in the right lung base, as well as increase in upper RLL opacities. Underwent bronch w/ BAL on 12/15 in order to get a better sample and determine which organism to treat for. Infectious workup has continued to be unrevealing. Fungal tests have been negative so far. Meropenem and oriconazole have been stopped in order to minimize side effects. Will likely consult rheumatology with further workup.  - ANA, ANCA  - SPEP, UPEP  - HIV  - follow up BAL studies  - airway clearance w/ HTS 3% nebs, Aerobika, and chest vest    Elevated IgE  Almost 1000. No formal history of asthma.  - Aspergillus specific precipitins  - comprehensive environmental panel    Thrombocytosis  Stable in the 800s, may be a sign of improving inflammation.  - daily CBC    Chronic post-thoracotomy pain syndrome  Chronic pain was consulted and provided recommendations.  - Tylenol 1000 mg q6h PRN  - oxycodone 5-10 mg q6h PRN  - tizanidine 2-4 mg TID PRN  - lidocaine patches    Iron deficiency  Iron 17, transferrin 137.6, and ferritin only 180, so likely iron deficient. Received IV iron 125 mg daily for 5 days (12/7 - 12/11).    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  COPD  - albuterol inhaler PRN  - Flovent  ??  Tobacco dependence  - nicotine lozenges    FEN/GI/DVT  - GI: home PPI  - DVT: Lovenox 40 mg daily  ??  Code status: DNR and DNI; confirmed with patient on admission    Patient has Advanced Directive with [name]  Healthcare POA: [name] [phone])     Dispo: Med G, floor  __________________________________________________________    Labs/Studies:  Labs and Studies from the last 24hrs per EMR and Reviewed    Objective:  Temp:  [36.8 ??C-38.5 ??C] 37.3 ??C  Heart Rate:  [81-103] 95  Resp:  [  16-18] 18  BP: (90-113)/(53-71) 101/62  SpO2:  [84 %-98 %] 98 %,   No intake or output data in the 24 hours ending 11/22/17 1439     Gen: sitting up in bed, NAD  CV: normal S1, S2, RRR, no murmurs  Pulm: clear on the left, diminished on the right, no crackles or wheezes  Abd: soft, NT, ND  Extr: WWP, no tenderness of edema of lower extremities  Neuro: A&O x3

## 2017-11-22 NOTE — Unmapped (Signed)
Med G Daily Progress Note      Interval History:  Febrile to 38.3 overnight, received Tylenol. Continues to have visual symptoms from voriconazole, but have improved. Feels better this morning than she has this entire admission.    Assessment/Plan:    Principal Problem:    Fever  Active Problems:    Tobacco use disorder    Bronchiectasis without complication (RAF-HCC)    History of surgical procedure    COPD (chronic obstructive pulmonary disease) (CMS-HCC)    Post-thoracotomy pain syndrome    Cough    History of Mycobacterium kansasii infection  Resolved Problems:    * No resolved hospital problems. *    Mallory [name] is a 61 year old woman with a PMH of COPD, bronchiectasis, and M kansasii infection s/p resection, RULectomy and superior RLL segmentectomy (2013) who presented to [facility] on 10/31/2017 with fever and cough, found to have RML consolidation, concerning for infection. 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  ??  RLL pneumonia  Rigid bronch 11/30 performed without fistula visualized (though cannot rule out microscopic fistula). Continues to fever to above 39 every night despite being on IV meropenem. Lower respiratory cultures have grown single colonies of Aspergillus, which were not thought to be significant, and galactomannan was negative. However, because of her persistent fevers, she is being empirically treated with voriconazole. CT chest demonstrated interval increase in patchy consolidations, GGO, and asymmetric interstitial opacities in the right lung base, as well as increase in upper RLL opacities. Underwent bronch w/ BAL on 12/15 in order to get a better sample and determine which organism to treat for. Leukocytosis improved to 10.1, although CRP still elevated at 253. Will decrease voriconazole dose given elevated trough and visual symptoms. Will also stop IV meropenem, especially with the possibility of it causing a drug fever.  - PO voriconazole 100 mg BID (12/12 - ), but holding until levels are within normal limits  - follow up fungitell assay  - follow up BAL studies  - airway clearance w/ HTS 3% nebs, Aerobika, and chest vest    Elevated IgE  Almost 1000. No formal history of asthma.  - Aspergillus specific precipitins  - comprehensive environmental panel    Thrombocytosis  Stable in the 800s, may be a sign of improving inflammation.  - daily CBC    Chronic post-thoracotomy pain syndrome  Chronic pain was consulted and provided recommendations.  - Tylenol 1000 mg q6h PRN  - oxycodone 5-10 mg q6h PRN  - tizanidine 2-4 mg TID PRN  - lidocaine patches    Iron deficiency  Iron 17, transferrin 137.6, and ferritin only 180, so likely iron deficient. Received IV iron 125 mg daily for 5 days (12/7 - 12/11).    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  COPD  - albuterol inhaler PRN  - Flovent  ??  Tobacco dependence  - nicotine lozenges    FEN/GI/DVT  - GI: home PPI  - DVT: Lovenox 40 mg daily  ??  Code status: DNR and DNI; confirmed with patient on admission    Patient has Advanced Directive with [name]  Healthcare POA: [name] [phone])     Dispo: Med G, floor  __________________________________________________________    Labs/Studies:  Labs and Studies from the last 24hrs per EMR and Reviewed    Objective:  Temp:  [36.9 ??C-38.1 ??C] 37.5 ??C  Heart Rate:  [67-101]  101  Resp:  [20-28] 20  BP: (97-119)/(57-69) 119/69  SpO2:  [84 %-96 %] 90 %,   No intake or output data in the 24 hours ending 11/21/17 2029     Gen: sitting up in bed, NAD  CV: tachycardic and regualar, no murmurs  Pulm: clear on the left, diminished on the right, crackles in the right lower field  Abd: soft, NT, ND  Extr: WWP, no tenderness of edema of lower extremities; 4 small erythematous papules on lateral aspect of right knee

## 2017-11-23 DIAGNOSIS — J470 Bronchiectasis with acute lower respiratory infection: Principal | ICD-10-CM

## 2017-11-23 LAB — PROTEIN ELECTROPHORESIS, SERUM
ALPHA-1 GLOBULIN: 0.7 g/dL — ABNORMAL HIGH (ref 0.2–0.5)
BETA-1 GLOBULIN: 0.3 g/dL (ref 0.3–0.6)
BETA-2 GLOBULIN: 0.4 g/dL (ref 0.2–0.6)
GAMMAGLOBULIN: 1.1 g/dL (ref 0.5–1.5)

## 2017-11-23 LAB — CBC W/ AUTO DIFF
BASOPHILS ABSOLUTE COUNT: 0 10*9/L (ref 0.0–0.1)
HEMOGLOBIN: 8.2 g/dL — ABNORMAL LOW (ref 12.0–16.0)
LARGE UNSTAINED CELLS: 3 % (ref 0–4)
LYMPHOCYTES ABSOLUTE COUNT: 1.2 10*9/L — ABNORMAL LOW (ref 1.5–5.0)
MEAN CORPUSCULAR HEMOGLOBIN CONC: 31.4 g/dL (ref 31.0–37.0)
MEAN CORPUSCULAR HEMOGLOBIN: 31.4 pg (ref 26.0–34.0)
MEAN CORPUSCULAR VOLUME: 100.1 fL — ABNORMAL HIGH (ref 80.0–100.0)
MEAN PLATELET VOLUME: 7.4 fL (ref 7.0–10.0)
MONOCYTES ABSOLUTE COUNT: 0.7 10*9/L (ref 0.2–0.8)
NEUTROPHILS ABSOLUTE COUNT: 7.4 10*9/L (ref 2.0–7.5)
PLATELET COUNT: 871 10*9/L — ABNORMAL HIGH (ref 150–440)
RED BLOOD CELL COUNT: 2.62 10*12/L — ABNORMAL LOW (ref 4.00–5.20)
RED CELL DISTRIBUTION WIDTH: 14.5 % (ref 12.0–15.0)
WBC ADJUSTED: 10.1 10*9/L (ref 4.5–11.0)

## 2017-11-23 LAB — BASIC METABOLIC PANEL
ANION GAP: 11 mmol/L (ref 9–15)
BLOOD UREA NITROGEN: 3 mg/dL — ABNORMAL LOW (ref 7–21)
BUN / CREAT RATIO: 7
CALCIUM: 8.7 mg/dL (ref 8.5–10.2)
CHLORIDE: 102 mmol/L (ref 98–107)
CO2: 27 mmol/L (ref 22.0–30.0)
CREATININE: 0.43 mg/dL — ABNORMAL LOW (ref 0.60–1.00)
GLUCOSE RANDOM: 92 mg/dL (ref 65–179)
POTASSIUM: 4.6 mmol/L (ref 3.5–5.0)
SODIUM: 140 mmol/L (ref 135–145)

## 2017-11-23 LAB — VORICONAZOLE LEVEL: Lab: 4.5

## 2017-11-23 LAB — ANA: ANTINUCLEAR ANTIBODIES (ANA): POSITIVE — AB

## 2017-11-23 LAB — HIV ANTIGEN/ANTIBODY COMBO: HIV 1+2 Ab+HIV1 p24 Ag:PrThr:Pt:Ser/Plas:Ord:IA: NONREACTIVE

## 2017-11-23 LAB — PHOSPHORUS: Phosphate:MCnc:Pt:Ser/Plas:Qn:: 4.8 — ABNORMAL HIGH

## 2017-11-23 LAB — EOSINOPHILS ABSOLUTE COUNT: Lab: 0.5 — ABNORMAL HIGH

## 2017-11-23 LAB — ANTINUCLEAR ANTIBODIES (ANA): Lab: POSITIVE — AB

## 2017-11-23 LAB — VORICONAZOLE: VORICONAZOLE LEVEL: 4.5 ug/mL (ref 1.0–5.5)

## 2017-11-23 LAB — GAMMAGLOBULIN: Lab: 1.1

## 2017-11-23 LAB — SODIUM: Sodium:SCnc:Pt:Ser/Plas:Qn:: 140

## 2017-11-23 LAB — MAGNESIUM: Magnesium:MCnc:Pt:Ser/Plas:Qn:: 1.8

## 2017-11-23 NOTE — Unmapped (Signed)
Rheumatology Consult Initial Note:    Reason for consult: Mallory [name] is seen in consultation at the request of Dr. Bonita Quin for evaluation of fever of unknown origin    Assessment: Mallory [name] is a 61 y.o. female  with history of iron deficiency, and kansasii pulmonary cavitary lesion, status post resection right upper lobectomy and superior right lower lobe segmentectomy in 2013,  bronchiectasis and COPD  presented to the hospital on 11/27 for fever and cough. Rheumatology has been consulted for continuing fevers despite treatment.  Patient at this time does not have clinical symptoms or lab findings that are concerning for autoimmune disease at this time that can explain her recurrent fevers.  In the setting of clinical improvement and decrease frequency of fevers, this is most likely due to her infectious etiology.  Her CT chest from 12/13 showed a short interval increase in patchy consolidation and groundglass opacities.  Before evaluating for other causes of fever it would be prudent to fully complete her antifungal regimen.  At this time I am not concerned for autoimmune or connective tissue disease in this patient. GPA/ Vasculitis can present this way however, patient does not have any other features at this time.  Her ANA from 12/17 is positive. ANCA is pending as well. I reviewed her CBC, urine and CMP.      Plan:  ANCA pending   Please consider a repeat biopsy if patient continues to have fevers    Mallory [name] , MD  Rheumatology Fellow  p [phone]    Patient seen and discussed with attending physician, Dr. Olean Ree    Thank you for this interesting consult.  We will continue to follow along.    HPI: This is a 61 year old Caucasian female with history of iron deficiency, and kansasii pulmonary cavitary lesion, status post resection right upper lobectomy and superior right lower lobe segmentectomy in 2013,  bronchiectasis and COPD who presented to the hospital on 11/27 for fever and cough.Rheumatology has been consulted for continuing fevers despite treatment.  Patient presented on 11/27 with fever and cough.  Per history she said that she has had fever since July 2018 with associated sputum changes.  She went to urgent care couple of times and then continued to have fevers.  She has been on multiple rounds of antibiotics.  When she presented here for presumed pneumonia there was concern for chronic bronchopulmonary fistula which was seen on her CT chest with area of infection and she underwent bronchoscopy.  Her bronchoscopy results showed 1 colony of Aspergillus on 6 December and her AFB and smears have been negative.  She has been on multiple rounds of antibiotics including IV Unasyn and switched to IV meropenem.  She was also on vancomycin since 6 December due to MRSA concern.  She has also been placed on voriconazole.  However continues to have fever of 38 ??C once every 24 hours.  Her voriconazole levels were elevated hence it was held.  Today patient notes that she does feel much better compared to when she initially came to the hospital.  She notes some muscle spasms.  But denies any vision loss, oral ulcers, sicca symptoms, Raynaud's, or swelling or stiffness in her small joints.  She denies any recent history of blood clots.  Or family history of autoimmune disease.  She denies any weight loss.  She states that she has history of osteoporosis.  But no family history of autoimmune disease.    Past Medical History:   Diagnosis Date   ???  Anxiety 10/17/2013   ??? Asthma 12/30/2006    Overview:  Qualifier: Diagnosis of  By: [name], [name].   Last Assessment & Plan:  Asx, lung exam today ok, cutting down tobacco    ??? Bronchiectasis (CMS-HCC) 12/24/2007    Overview:  Followed in Pulmonary clinic/ [facility]/ [facility]     - See CT 11/10/2005 RLL changes    - New changes RUL CT Chest  12/29/2011 ? All related to bronchiectasis >FOB 02/15/12 > neg cyt/ neg tbbx/ no wbc's on gm stain ? Afb/fungal studies done as requested    - 02/29/12 Rec PET Scan > pt declined > 03/05/2012 rec t surgery eval for RUL obectomy done 11/06/12 > Pos M Kansasii> f/u ID     - a   ??? Dislocation of shoulder region 10/17/2013   ??? Endometriosis 12/30/2006    Overview:  Qualifier: Diagnosis of  By: [name], Jose E.    ??? Gastroesophageal reflux disease 12/30/2006    Overview:  Qualifier: Diagnosis of  By: [name], [name].   Last Assessment & Plan:  Symptoms relatively well controlled with PPIs twice a day    ??? Hemoptysis    ??? Impaired glucose tolerance 04/02/2014    Last Assessment & Plan:  A1c 08-2013 was 5.9. Patient aware, recheck her A1c    ??? 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 s/p right partial lobectomy   ??? Insomnia 11/22/2010    Overview:  Qualifier: Diagnosis of  By: [name], [name].   Last Assessment & Plan:  Well-controlled with alprazolam    ??? Intertrochanteric fracture (CMS-HCC) 10/17/2013    Last Assessment & Plan:  Status post left hip injury  10/2013, doing outpatient PT.    ??? Lichen planus 12/30/2006    Overview:  Annotation: s/p mouth BX Qualifier: Diagnosis of  By: [name], Jose E.    ??? Liver cyst 12/24/2007   ??? Osteoarthritis 12/30/2006    Overview:  Qualifier: Diagnosis of  By: [name], [name].   Last Assessment & Plan:  On hydrocodone, needs a refill. UDS low 08-2013    ??? Osteoporosis 12/30/2006    Overview:  Dx at age 33, multiple DEXAs in the past at gyn, last DEXA? Intolerant to fosamax reclast was denied in the past   Last Assessment & Plan:  Not recent bone density test, intolerant to Fosamax, insurance denied  reclast  before. Plan: Bone density test,vit d, ca and vit po supplements     ??? Spontaneous pneumothorax     x2 in her 70s, had chest tube for 1   ??? Tobacco dependence syndrome 12/24/2007    Overview:  Limits of effective care disussed in pulmonary clinic/ [facility]/ 01/20/12  Last Assessment & Plan:  Today she is again counseled, I discussed Wellbutrin, Chantix. She reports that in the past she got very aggressive while taking Chantix    ??? Vascular insufficiency of intestine (CMS-HCC) 12/24/2007    Overview:  Qualifier: Diagnosis of  By: Koleen Distance CMA Duncan Dull), Leisha     ??? Vitamin B-complex deficiency 12/30/2006    Overview:  Mild on oral supplements   Last Assessment & Plan:  B 12 levels  consistently normal        Family History   Problem Relation Age of Onset   ??? Heart disease Maternal Grandfather    ??? Multiple sclerosis Sister    ??? Stroke Maternal Grandmother    ???  Cancer Paternal Grandmother    ??? Diabetes Mother    ??? Heart disease Mother    ??? Dementia Mother    ??? Cancer Father         sarcoma       Social History   Substance Use Topics   ??? Smoking status: Current Every Day Smoker     Packs/day: 1.00     Types: Cigarettes   ??? Smokeless tobacco: Never Used   ??? Alcohol use 4.2 oz/week     7 Glasses of wine per week       Outpatient Medications  No current facility-administered medications on file prior to encounter.      Current Outpatient Prescriptions on File Prior to Encounter   Medication Sig Dispense Refill   ??? acetaminophen (TYLENOL) 500 MG tablet Take 500 mg by mouth every four (4) hours as needed. Moderate pain or fever     ??? ALPRAZolam (XANAX) 0.5 MG tablet Take 0.5 mg by mouth nightly as needed. Take 0.25 to 0.5 mg qhs PRN     ??? aspirin 81 MG chewable tablet Chew 81 mg daily.     ??? beclomethasone dipropionate (QVAR REDIHALER) 80 mcg/actuation inhaler Inhale 2 puffs.     ??? benzonatate (TESSALON) 100 MG capsule Take 100-200 mg by mouth.     ??? HYDROcodone-acetaminophen (NORCO) 7.5-325 mg per tablet Take 1 tablet by mouth every six (6) hours as needed.     ??? lysine 1,000 mg Tab Take by mouth.     ??? magnesium oxide (MAG-OX) 400 mg tablet Take 400 mg by mouth daily.      ??? methocarbamol (ROBAXIN) 500 MG tablet Take 500 mg by mouth Three (3) times a day as needed.      ??? nicotine polacrilex (NICORETTE) 4 MG lozenge Apply 4 mg to cheek.     ??? pantoprazole (PROTONIX) 40 MG tablet Take 40 mg by mouth Two (2) times a day.     ??? potassium gluconate 595 mg (99 mg) Tab Take 1 capsule by mouth daily.      ??? sodium chloride 3 % nebulizer solution Inhale 4 mL by nebulization Two (2) times a day. Inhale 4 ml twice daily 240 mL 11   ??? VENTOLIN HFA 90 mcg/actuation inhaler INHALE 2 PUFFS EVERY 4 HOURS AS NEEDED FOR WHEEZING OR SHORTNESS OF BREATH. 18 g 6       Current Medications:  ??? aspirin  81 mg Oral Daily   ??? fluticasone  2 puff Inhalation Q12H (RT)   ??? pantoprazole  40 mg Oral BID   ??? sodium chloride  4 mL Nebulization 4x Daily (RT)     Infusion:   PRN: acetaminophen, albuterol, ALPRAZolam, heparin, porcine (PF), lidocaine, nicotine polacrilex, ondansetron, oxyCODONE **OR** oxyCODONE, polyethylene glycol, tiZANidine **OR** tiZANidine    Allergies   Allergen Reactions   ??? Diphenhydramine Rash     Makes lungs bleed   ??? Pseudoephedrine Hcl Other (See Comments)     Has Ischemic Colitis. Makes colon bleed   ??? Levofloxacin Anxiety   ??? Alendronate Sodium Other (See Comments)     Heart burn  REACTION: chest pain   ??? Diphenhydramine Hcl Other (See Comments)     REACTION: makes lungs bleed   ??? Pheniramine Other (See Comments)     REACTION: makes lungs bleed   ??? Pheniramine Maleate Other (See Comments)     REACTION: makes lungs bleed   ??? Pseudoephedrine Other (See Comments)  REACTION: Ischemic colitis   ??? Tolmetin Other (See Comments)     REACTION: ischemic colitis   ??? Codeine-Guaifenesin Itching and Other (See Comments)     Palms itching         Review of Systems: As per HPI, otherwise 12 system review negative      Objective:  Vitals:  Temp:  [36.7 ??C-38.3 ??C] (P) 37 ??C  Heart Rate:  [87-108] (P) 93  Resp:  [18-24] (P) 24  BP: (84-113)/(49-71) (P) 92/55  MAP (mmHg):  [65-77] (P) 65  SpO2:  [91 %-97 %] (P) 93 %    Exam:  Gen: NAD, A&Ox3,  Eyes: Anicteric sclera, PERRL  ENT: No oral lesions. Moist mucuous membranes. Clear oropharynx.  Pulmonary: Clear to auscultation bilaterally.  No wheezes or rales appreciated.  Cardiac: Regular rate and rhythm.  No murmurs, rubs or gallops appreciated  GI:  Soft.  Non-tender. No organomegaly appreciated. Normal active bowel sounds.  MSK: Normal exam with full ROM and no synovitis or tenderness to palpation in hands, wrists, elbows, shoulders, knees, ankles. Negative MTP squeeze test.  Neuro: Strength 5/5 x4, grossly intact  Derm: No rashes appreciated    Labs:   Lab Results   Component Value Date    WBC 10.1 11/23/2017    HGB 8.2 (L) 11/23/2017    HCT 26.2 (L) 11/23/2017    PLT 871 (H) 11/23/2017       Lab Results   Component Value Date    NA 140 11/23/2017    K 4.6 11/23/2017    CL 102 11/23/2017    CO2 27.0 11/23/2017    BUN 3 (L) 11/23/2017    CREATININE 0.43 (L) 11/23/2017    GLU 92 11/23/2017    CALCIUM 8.7 11/23/2017    MG 1.8 11/23/2017    PHOS 4.8 (H) 11/23/2017       Lab Results   Component Value Date    BILITOT 0.5 11/20/2017    BILIDIR 0.50 (H) 11/20/2017    PROT 5.6 (L) 11/20/2017    ALBUMIN 2.8 (L) 11/20/2017    ALT 28 11/20/2017    AST 28 11/20/2017    ALKPHOS 102 11/20/2017       Lab Results   Component Value Date    INR 1.26 11/02/2017    APTT 31.7 10/31/2017       UA  UPCR    CK  LDH  ESR  CRP    Immunology:  ANA  dsDNA  ENA  RF  ANCA  C3  C4      Imaging:

## 2017-11-23 NOTE — Unmapped (Signed)
Problem: Patient Care Overview  Goal: Plan of Care Review  Outcome: Progressing  Patient A/Ox4. Patient compliant with scheduled medications. Patient requested PRN oxycodone and miralax. Oxycodone moderately effective and awaiting results for miralax. Patient ambulated outside of room multiple times this shift and tolerated well. VSS on room air. Patient has been afebrile this shift. Will continue to monitor.    Problem: Infection, Risk/Actual (Adult)  Goal: Infection Prevention/Resolution  Patient will demonstrate the desired outcomes by discharge/transition of care.   Outcome: Progressing      Problem: VTE, DVT and PE (Adult)  Goal: Signs and Symptoms of Listed Potential Problems Will be Absent, Minimized or Managed (VTE, DVT and PE)  Signs and symptoms of listed potential problems will be absent, minimized or managed by discharge/transition of care (reference VTE, DVT and PE (Adult) CPG).   Outcome: Progressing      Problem: Breathing Pattern Ineffective (Adult)  Goal: Effective Oxygenation/Ventilation  Patient will demonstrate the desired outcomes by discharge/transition of care.   Outcome: Progressing      Problem: Pain, Chronic (Adult)  Goal: Acceptable Pain/Comfort Level and Functional Ability  Patient will demonstrate the desired outcomes by discharge/transition of care.   Outcome: Progressing

## 2017-11-24 ENCOUNTER — Ambulatory Visit (HOSPITAL_COMMUNITY): Payer: Self-pay | Admitting: Psychiatry

## 2017-11-24 DIAGNOSIS — J470 Bronchiectasis with acute lower respiratory infection: Principal | ICD-10-CM

## 2017-11-24 LAB — MEAN PLATELET VOLUME: Lab: 7.4

## 2017-11-24 LAB — CBC W/ AUTO DIFF
BASOPHILS ABSOLUTE COUNT: 0 10*9/L (ref 0.0–0.1)
EOSINOPHILS ABSOLUTE COUNT: 0.3 10*9/L (ref 0.0–0.4)
HEMATOCRIT: 26 % — ABNORMAL LOW (ref 36.0–46.0)
HEMOGLOBIN: 7.9 g/dL — ABNORMAL LOW (ref 12.0–16.0)
LARGE UNSTAINED CELLS: 3 % (ref 0–4)
LYMPHOCYTES ABSOLUTE COUNT: 1.4 10*9/L — ABNORMAL LOW (ref 1.5–5.0)
MEAN CORPUSCULAR HEMOGLOBIN: 30.1 pg (ref 26.0–34.0)
MEAN CORPUSCULAR VOLUME: 98.4 fL (ref 80.0–100.0)
MEAN PLATELET VOLUME: 7.4 fL (ref 7.0–10.0)
MONOCYTES ABSOLUTE COUNT: 0.6 10*9/L (ref 0.2–0.8)
NEUTROPHILS ABSOLUTE COUNT: 6.3 10*9/L (ref 2.0–7.5)
PLATELET COUNT: 849 10*9/L — ABNORMAL HIGH (ref 150–440)
RED BLOOD CELL COUNT: 2.64 10*12/L — ABNORMAL LOW (ref 4.00–5.20)
RED CELL DISTRIBUTION WIDTH: 14.8 % (ref 12.0–15.0)

## 2017-11-24 LAB — MPO-QUANT: Lab: 1.5

## 2017-11-24 LAB — BASIC METABOLIC PANEL
ANION GAP: 9 mmol/L (ref 9–15)
BLOOD UREA NITROGEN: 6 mg/dL — ABNORMAL LOW (ref 7–21)
BUN / CREAT RATIO: 14
CALCIUM: 8.6 mg/dL (ref 8.5–10.2)
CHLORIDE: 103 mmol/L (ref 98–107)
CO2: 28 mmol/L (ref 22.0–30.0)
EGFR MDRD AF AMER: >=60 mL/min/{1.73_m2} (ref >=60)
EGFR MDRD NON AF AMER: >=60 mL/min/{1.73_m2} (ref >=60)
GLUCOSE RANDOM: 100 mg/dL (ref 65–179)
POTASSIUM: 4.6 mmol/L (ref 3.5–5.0)
SODIUM: 140 mmol/L (ref 135–145)

## 2017-11-24 LAB — ANTI-NEUTROPHILIC CYTOPLASMIC ANTIBODY
ANCA IFA: NEGATIVE
MPO-ELISA: NEGATIVE
MPO-QUANT: 1.5 U/mL (ref <21.0)
PR3-QUANT: 1.7 U/mL (ref <21.0)

## 2017-11-24 LAB — IMMUNOFIXATION ELECTROPHORESIS 1: Lab: 0

## 2017-11-24 LAB — EGFR MDRD NON AF AMER: Glomerular filtration rate/1.73 sq M.predicted.non black:ArVRat:Pt:Ser/Plas/Bld:Qn:Creatinine-based formula (MDRD): >=60

## 2017-11-24 LAB — PROTEIN URINE: Protein:MCnc:Pt:Urine:Qn:: <4

## 2017-11-24 LAB — PHOSPHORUS: Phosphate:MCnc:Pt:Ser/Plas:Qn:: 5.3 — ABNORMAL HIGH

## 2017-11-24 LAB — MAGNESIUM
MAGNESIUM: 1.8 mg/dL (ref 1.6–2.2)
Magnesium:MCnc:Pt:Ser/Plas:Qn:: 1.8

## 2017-11-24 LAB — VORICONAZOLE LEVEL: Lab: 0.5 — ABNORMAL LOW

## 2017-11-24 NOTE — Unmapped (Signed)
Pharmacy Note: Voriconazole Dosing / Monitoring  Mallory [name] 61 y.o. 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  Goal voriconazole level(Trough): 1-5.5 mcg/mL  Lab runs test every M-W-F, samples must be in lab by 1pm, often reported late afternoon, early morning next day    Initial dose: 200 mg PO BID  (11/15/17  ~ 4.3mg /kg, 46.8kg)  *No Load   12/12 am - 12/16 am  100mg  (12/16 pm till 12/17am)    Patient experiencing persistent significant visual changes, vivid dreams -- most likely secondarily due to elevated concentration    Resulted level   12/16 level from 12/14 2103 = 7.5 mcg/mL  (true trough)  12/18 level from 12/17 0608 = 9.87mcg/mL   12/20 level from 12/19 0540 = 4.33mcgmL      Recommendation:  1.  STOP voriconazole indefinitely  2. Should voriconazole be restarted start at 100mg  bid, check levels 3-4 days after starting    Pharmacist will continue to monitor levels.  Patient will be followed for changes in hepatic function, toxicity, efficacy, and initiation or discontinuation of interacting medications. Please page service pharmacist with questions/clarifications.    Daylan Boggess, PharmD  MD[facility] Team Pharmacist for November 23, 2017 5:37 PM

## 2017-11-24 NOTE — Unmapped (Signed)
GENERAL INFECTIOUS DISEASES TEAM B CONSULT NOTE  ??  ??  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 In addition she has had worsening CT scan (R middle lobe consolidation and increasing RLL consolidations) while on meropenem x almost two weeks, as well positive BAL(11/30) and sputum (12/10) cultures ??for A.fumigatus concerning for invasive aspergillosis. AFB smears of multiple respiratory samples have been negatives, making it less likely that she has a mycobacterial infection. She had repeat bronch on ??11/18/17 and BAL studies are pending, but BAL galactomannan was also negative, which makes invasive pulmonary aspergillosis less likely (it is a fairly sensitive marker). She continues having fever intermittently. She is clinically stable otherwise.   She was started on voriconazole on 12/12 but developed visual symptoms in the setting of high levels, so it is being held  ??  ID Problem List:  1. Fever/R middle and lower lobe infiltrates, with worsening of CT scan findings while on meropenem and positive BAL cxs for Aspergillus fumigatus ??concerning for invasive aspergillosis (negative BAL galactomannan goes against this diagnosis)  2. Hx of pulmonary M.kansasii infection in 2013 s/p RUL lobectomy and antimicrobial therapy with multiple negative AFB sputum cultures suspequently  3. COPD/bronchiectasis  ??  ??  Recommendations:  1.Continue holding voriconazole for now (level down to 4.5), esp since it is not very clear that she has invasive aspergillosis. Discussions are underway for a possible endobronchial biopsy to try to establish a diagnosis of her pulmonary process ( non infectious conditions such as BOOP are maybe becoming more likely)  2. Follow up BAL studies from ??Bronch done on 12/15. Follow up positive sputum cx for Aspergillus spp from 11/13/17  ??  ??  Thank you for involving [place] in the care of this patient. The General ID service Team B will continue to follow.   Please call the General B pager with questions at [phone].   ??  Bettyjean Stefanski MD  ??  ??  Interval events and ROS:  She had fever again last night (100.9). Afebrile today so far. Feeling well. Visual changes are much better (no diplopia episodes, flashes are much less)  ??  Allergies:  Diphenhydramine; Pseudoephedrine hcl; Levofloxacin; Alendronate sodium; Diphenhydramine hcl; Pheniramine; Pheniramine maleate; Pseudoephedrine; Tolmetin; and Codeine-guaifenesin  ??  ??  Medications:??  Current antimicrobials:    Voriconazole (12/12-) (being held currently due to high levels)  ??  Previous antimicrobials:  Unasyn  ??Meropenem (12/2-12/17)  ??  Other medications reviewed.   ??  ??  Objective:??  Vitals:    11/23/17 1351   BP: (P) 92/55   Pulse: (P) 93   Resp: (P) 24   Temp: (P) 37 ??C (98.6 ??F)   SpO2: (P) 93%       Physical exam   General: in no acute distress  HEENT: anicteric conjunctivae,normal extraoccular muscle movements  LUNGS: normal work of breathing, no crackles  ABDOMINAL:??soft, no tenderness  MSK: no edema, no joint effusions   SKIN: no rash or skin breakdown  NEUROLOGICAL: alert and oriented x3  PSYCH: interactive  LINES: appear non-infected  ??  ??  Labs:????    Lab Results   Component Value Date    WBC 10.1 11/23/2017    HGB 8.2 (L) 11/23/2017  HCT 26.2 (L) 11/23/2017    PLT 871 (H) 11/23/2017       Lab Results   Component Value Date    NA 140 11/23/2017    K 4.6 11/23/2017    CL 102 11/23/2017    CO2 27.0 11/23/2017    BUN 3 (L) 11/23/2017    CREATININE 0.43 (L) 11/23/2017    GLU 92 11/23/2017    CALCIUM 8.7 11/23/2017    MG 1.8 11/23/2017    PHOS 4.8 (H) 11/23/2017       Lab Results   Component Value Date    BILITOT 0.5 11/20/2017    BILIDIR 0.50 (H) 11/20/2017    PROT 5.7 (L) 11/22/2017    ALBUMIN 2.0 (L) 11/22/2017    ALT 28 11/20/2017    AST 28 11/20/2017    ALKPHOS 102 11/20/2017       Drug monitoring:  Lab Results   Component Value Date    VANCOTROUGH 9.4 (L) 11/10/2017    VORILVL 4.5 11/22/2017    VORILVL 9.6 (H) 11/20/2017     ??  Galactomannan (serum) <0.5 (12/11)  Galactomannan (BAL) <0.5 (12/15)  Beta D glucan <31 (12/11)  ??    Microbiology:    Microbiology Results (last day)     Procedure Component Value Date/Time Date/Time    Lower Respiratory Culture [[id number]]  (Abnormal) Collected:  11/13/17 0532    Lab Status:  Edited Result - FINAL Specimen:  Sputum from SPUTUM EXPECTORATED Updated:  11/23/17 0906     Lower Respiratory Culture Oropharyngeal Flora Isolated      Aspergillus fumigatus (A)     Comment: - - one colony  Further Identification By Consultation Only  Processed at Physicians Request        Gram Stain >25 PMNS/LPF      <10  Epithelial cells/LPF      Probable 1+ Gram positive cocci      Acceptable for culture    Narrative:       Specimen Source: SPUTUM EXPECTORATED    Micro Add-on [[id number]] Collected:  11/13/17 0532    Lab Status:  Final result Specimen:  Sputum from SPUTUM EXPECTORATED Updated:  11/23/17 0906        BAL cx: 12/15 (AFB, fungal: ngtd)    Studies:??  Ct Chest Wo Contrast  ??  Result Date: 11/16/2017  EXAM: CT CHEST WO CONTRAST DATE: 11/16/2017 4:03 PM ACCESSION: [id number] UN DICTATED: 11/16/2017 4:08 PM INTERPRETATION LOCATION: [facility]   ??  CLINICAL INDICATION: 61 years old Female with Other-follow-up chronic pneumonia- ??  ??  COMPARISON: Chest CT 11/05/2017 TECHNIQUE: A spiral CT scan was obtained without IV contrast from the thoracic inlet through the hemidiaphragms. Images were reconstructed in the axial plane. ??Coronal and sagittal reformatted images of the chest were also provided for further evaluation of the lung parenchyma.   ??  FINDINGS: Left PICC terminates in right atrium.   ??  AIRWAYS, LUNGS, PLEURA: Emphysematous changes. Post right upper lobectomy. Right apical thick-walled cystic lesion similar to prior. Slight interval worsening in consolidation of the superior aspect of the right lower lobe. Short interval increase in patchy airspace opacities with adjacent groundglass and asymmetric intralobular septal thickening in the base of the right lower lobe. New foci of endobronchial opacification in right lower lobe.   ??  MEDIASTINUM: Normal heart size. No pericardial effusion. Normal caliber thoracic aorta. Aortic and coronary artery calcifications.   ??  Right paratracheal lymphadenopathy, with increase in right superior paratracheal region, up  to 1.1 cm, previously subcentimeter, (2:31.   ??  IMAGED ABDOMEN: Unremarkable.   ??  SOFT TISSUES: Unremarkable.   ??  BONES: Unchanged chronic changes in the right lateral second and sixth ribs. 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   ??  ??  ??  --Short interval increase in patchy consolidations, groundglass opacities, and asymmetric interstitial opacities in the right lung base could reflect worsening multifocal pneumonia, superimposed acute aspiration (particularly given new RLL endobronchial opacification), and/or asymmetric edema.   ??  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 Attention on follow-up.   ??  --Stable to slight increase in consolidative opacities in the upper portion of the right lower lobe.

## 2017-11-24 NOTE — Unmapped (Signed)
GENERAL INFECTIOUS DISEASES TEAM B CONSULT NOTE  ??  ??  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 In addition she has had worsening CT scan (R middle lobe consolidation and increasing RLL consolidations) while on meropenem x almost two weeks, as well positive BAL(11/30) and sputum (12/10) cultures ??for A.fumigatus concerning for invasive aspergillosis. AFB smears of multiple respiratory samples have been negatives, making it less likely that she has a mycobacterial infection. She had repeat bronch on ??11/18/17 and BAL studies are pending, but BAL galactomannan was also negative, which makes invasive pulmonary aspergillosis less likely (it is a fairly sensitive marker). She continues having fever intermittently. She is clinically stable otherwise.   She was started on voriconazole on 12/12 but developed visual symptoms in the setting of high levels, so it is being held. A repeat CT chest from yesterday showed stable findings overall, but some worsening of the RLL consolidation (reviewed with radiologist). She will have repeat bronch today for possible endobronchial biopsy.  ??  ID Problem List:  1. Fever/R middle and lower lobe infiltrates, with worsening of CT scan findings while on meropenem and positive BAL cxs for Aspergillus fumigatus ??concerning for invasive aspergillosis (negative BAL galactomannan goes against this diagnosis)  2. Hx of pulmonary M.kansasii infection in 2013 s/p RUL lobectomy and antimicrobial therapy with multiple negative AFB sputum cultures suspequently  3. COPD/bronchiectasis  ??  ??  Recommendations:  1.Continue holding voriconazole for now (repeat level pending from today), esp since it is not very clear that she has invasive aspergillosis. She will have repeat bronch today for endobronchial biopsy to try to establish a definite diagnosis  2. Follow up BAL studies from ??Bronch done on 12/15.   ??  ??  Thank you for involving [place] in the care of this patient. The General ID service Team B will continue to follow.   Please call the General B pager with questions at [phone].   ??  Kadesia Robel MD  ??  ??  Interval events and ROS:   Tmax 100.2 yesterday. Feeling well. Visual changes are even better today  ??  Allergies:  Diphenhydramine; Pseudoephedrine hcl; Levofloxacin; Alendronate sodium; Diphenhydramine hcl; Pheniramine; Pheniramine maleate; Pseudoephedrine; Tolmetin; and Codeine-guaifenesin  ??  ??  Medications:??  Current antimicrobials:  ??  Voriconazole (12/12-) (being held currently due to high levels)  ??  Previous antimicrobials:  Unasyn  ??Meropenem (12/2-12/17)  ??  Other medications reviewed.   ??  ??  Objective:??  Vitals:    11/24/17 0928   BP: 101/62   Pulse:    Resp: 18   Temp: 37 ??C (98.6 ??F)   SpO2: 94%     ??  Physical exam   General: in no acute distress  HEENT: anicteric conjunctivae,SKIN: no rash or skin breakdown  NEUROLOGICAL: alert and oriented x3  PSYCH: interactive  LINES: appear non-infected  ??  ??  Labs:????  ??  Lab Results   Component Value Date    WBC 8.9 11/24/2017    HGB 7.9 (L) 11/24/2017    HCT 26.0 (L) 11/24/2017    PLT 849 (H) 11/24/2017       Lab Results   Component Value Date    NA 140 11/24/2017  K 4.6 11/24/2017    CL 103 11/24/2017    CO2 28.0 11/24/2017    BUN 6 (L) 11/24/2017    CREATININE 0.44 (L) 11/24/2017    GLU 100 11/24/2017    CALCIUM 8.6 11/24/2017    MG 1.8 11/24/2017    PHOS 5.3 (H) 11/24/2017       Lab Results   Component Value Date    BILITOT 0.5 11/20/2017    BILIDIR 0.50 (H) 11/20/2017    PROT 5.7 (L) 11/22/2017    ALBUMIN 2.0 (L) 11/22/2017    ALT 28 11/20/2017    AST 28 11/20/2017    ALKPHOS 102 11/20/2017       Lab Results   Component Value Date    INR 1.26 11/02/2017    APTT 31.7 10/31/2017     ??  Galactomannan (serum) <0.5 (12/11)  Galactomannan (BAL) <0.5 (12/15)  Beta D glucan <31 (12/11)  ??  Drug monitoring:  Lab Results   Component Value Date    VANCOTROUGH 9.4 (L) 11/10/2017    VORILVL 4.5 11/22/2017    VORILVL 9.6 (H) 11/20/2017   ??  Microbiology:    Microbiology Results (last day)     Procedure Component Value Date/Time Date/Time    Fungal Culture [[id number]] Collected:  11/18/17 0958    Lab Status:  Preliminary result Specimen:  Lavage, Bronchial from Lung, Right Lower Lobe Updated:  11/24/17 1103     Fungal Pathogen Screen NEGATIVE TO DATE     Fungus Stain NO FUNGI SEEN    Narrative:       Specimen Source: Lung, Right Lower Lobe    Fungal Culture [[id number]] Collected:  11/18/17 0958    Lab Status:  Preliminary result Specimen:  Washing, Bronchial from Lung, Right Mainstem Updated:  11/24/17 1103     Fungal Pathogen Screen NEGATIVE TO DATE     Fungus Stain NO FUNGI SEEN    Narrative:       Specimen Source: Lung, Right Mainstem    Fungal Culture [[id number]] Collected:  11/03/17 1152    Lab Status:  Final result Specimen:  Washing, Bronchial from Lung, Combined Updated:  11/24/17 0734     Fungal Pathogen Screen NO FUNGUS ISOLATED      Fungus Stain NO FUNGI SEEN    Narrative:       Specimen Source: Lung, Combined          BAL cx: 12/15 (AFB, fungal: ngtd)  ??  Studies:??    CT chest 12/20  Impression       - New right lower lobe airspace consolidation, concerning for infection.  - Cystic lesion in the right superior hemithorax may be secondary to bronchopleural fistula  ??         Ct Chest Wo Contrast  ??  Result Date: 11/16/2017  EXAM: CT CHEST WO CONTRAST DATE: 11/16/2017 4:03 PM ACCESSION: [id number] UN DICTATED: 11/16/2017 4:08 PM INTERPRETATION LOCATION: [facility]   ??  CLINICAL INDICATION: 61 years old Female with Other-follow-up chronic pneumonia- ??  ??  COMPARISON: Chest CT 11/05/2017 TECHNIQUE: A spiral CT scan was obtained without IV contrast from the thoracic inlet through the hemidiaphragms. Images were reconstructed in the axial plane. ??Coronal and sagittal reformatted images of the chest were also provided for further evaluation of the lung parenchyma.   ??  FINDINGS: Left PICC terminates in right atrium.   ??  AIRWAYS, LUNGS, PLEURA: Emphysematous changes. Post right upper lobectomy. Right apical thick-walled cystic lesion similar to prior. Slight  interval worsening in consolidation of the superior aspect of the right lower lobe. Short interval increase in patchy airspace opacities with adjacent groundglass and asymmetric intralobular septal thickening in the base of the right lower lobe. New foci of endobronchial opacification in right lower lobe.   ??  MEDIASTINUM: Normal heart size. No pericardial effusion. Normal caliber thoracic aorta. Aortic and coronary artery calcifications.   ??  Right paratracheal lymphadenopathy, with increase in right superior paratracheal region, up to 1.1 cm, previously subcentimeter, (2:31.   ??  IMAGED ABDOMEN: Unremarkable.   ??  SOFT TISSUES: Unremarkable.   ??  BONES: Unchanged chronic changes in the right lateral second and sixth ribs. 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   ??  ??  ??  --Short interval increase in patchy consolidations, groundglass opacities, and asymmetric interstitial opacities in the right lung base could reflect worsening multifocal pneumonia, superimposed acute aspiration (particularly given new RLL endobronchial opacification), and/or asymmetric edema.   ??  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 Attention on follow-up.   ??  --Stable to slight increase in consolidative opacities in the upper portion of the right lower lobe.

## 2017-11-24 NOTE — Unmapped (Signed)
Problem: Patient Care Overview  Goal: Plan of Care Review  Outcome: Progressing   11/23/17 1838   OTHER   Plan of Care Reviewed With patient   Plan of Care Review   Progress improving   Patient remained afebrile this shift, ambulates in room and hallway independently, still complaining of chest pains which is relieved with pain medication. Will continue to monitor.  Goal: Individualization and Mutuality  Outcome: Progressing    Goal: Discharge Needs Assessment  Outcome: Progressing    Goal: Interprofessional Rounds/Family Conf  Outcome: Progressing      Problem: Infection, Risk/Actual (Adult)  Goal: Infection Prevention/Resolution  Patient will demonstrate the desired outcomes by discharge/transition of care.   Outcome: Progressing   11/22/17 1509   Infection, Risk/Actual (Adult)   Infection Prevention/Resolution making progress toward outcome       Problem: VTE, DVT and PE (Adult)  Goal: Signs and Symptoms of Listed Potential Problems Will be Absent, Minimized or Managed (VTE, DVT and PE)  Signs and symptoms of listed potential problems will be absent, minimized or managed by discharge/transition of care (reference VTE, DVT and PE (Adult) CPG).   Outcome: Progressing   11/22/17 1509   VTE, DVT and PE (Adult)   Problems Assessed (VTE, DVT, PE) all   Problems Present (VTE, DVT, PE) none       Problem: Breathing Pattern Ineffective (Adult)  Goal: Effective Oxygenation/Ventilation  Patient will demonstrate the desired outcomes by discharge/transition of care.   Outcome: Progressing   11/22/17 1509   Breathing Pattern Ineffective (Adult)   Effective Oxygenation/Ventilation making progress toward outcome       Problem: Pain, Chronic (Adult)  Goal: Acceptable Pain/Comfort Level and Functional Ability  Patient will demonstrate the desired outcomes by discharge/transition of care.   Outcome: Progressing   11/22/17 1509   Pain, Chronic (Adult)   Acceptable Pain/Comfort Level and Functional Ability making progress toward outcome

## 2017-11-24 NOTE — Unmapped (Signed)
Med G Daily Progress Note      Interval History:  Febrile to 38.3 12/19 PM around 1900, received Tylenol. Visual symptoms have improved. Continuing to hold voriconazole though therapeutic levels on 12/20. Rheumatology consulted. Immunofixation electrophoresis ordered after gammopathy reported on SPEP. Lymphopenia on Hemepath review. NPO at 0600 12/21 for possible bronch (repeat CT Chest also planned 12/21 to check on interval progression of chest). 24 hour urine creatinine ordered 12/20 to help with medicine dosing.     Assessment/Plan:    Principal Problem:    Fever  Active Problems:    Tobacco use disorder    Bronchiectasis without complication (RAF-HCC)    History of surgical procedure    COPD (chronic obstructive pulmonary disease) (CMS-HCC)    Post-thoracotomy pain syndrome    Cough    History of Mycobacterium kansasii infection  Resolved Problems:    * No resolved hospital problems. *    Mallory [name] is a 61 year old woman with a PMH of COPD, bronchiectasis, and M kansasii infection s/p resection, RULectomy and superior RLL segmentectomy (2013) who presented [facility] 10[facility]2017 with fever and cough, found to have RML consolidation, concerning for infection, although no definitive organism has been identified, and she continues to be febrile despite anti-bacterial and anti-fungal agents. May need to consider malignant or rheumatologic causes.  ??  Fever, likely due to RML and RLL pneumonia  Rigid bronch 11/30 performed without fistula visualized (though cannot rule out microscopic fistula). Continues to fever to above 39 every night despite being on IV meropenem. Lower respiratory cultures have grown single colonies of Aspergillus, which were not thought to be significant, and galactomannan was negative. However, because of her persistent fevers, she is being empirically treated with voriconazole. CT chest demonstrated interval increase in patchy consolidations, GGO, and asymmetric interstitial opacities in the right lung base, as well as increase in upper RLL opacities. Underwent bronch w/ BAL on 12/15 in order to get a better sample and determine which organism to treat for. Infectious workup has continued to be unrevealing. Fungal tests have been negative so far. Meropenem and oriconazole have been stopped in order to minimize side effects. Will likely consult rheumatology with further workup.  - 12/17 ANA positive, speckled; patient reported she had a slightly elevated rheumatoid factor during rheumatological testing performed in her 30s  [ ]  f/u 11/20/17 ANCA (in process)  - 11/22/17 SPEP pattern demonstrated an irregularity in the gamma region, which may represent monoclonal protein.   [ ]  f/u 12/20 Immunofixation electrophoresis (follow-up to SPEP)  [ ]  f/u UPEP (ordered, not yet collected)  - 12/17 HIV antigen/antibody nonreactive  [ ]  follow up BAL studies  - airway clearance w/ HTS 3% nebs, Aerobika, and chest vest    Elevated IgE  Almost 1000. No formal history of asthma.  - Aspergillus specific precipitins  - comprehensive environmental panel    Thrombocytosis  Stable in the 800s, may be a sign of improving inflammation.  - daily CBC    Chronic post-thoracotomy pain syndrome  Chronic pain was consulted and provided recommendations.  - Tylenol 1000 mg q6h PRN  - oxycodone 5-10 mg q6h PRN  - tizanidine 2-4 mg TID PRN  - lidocaine patches    Iron deficiency  Iron 17, transferrin 137.6, and ferritin only 180, so likely iron deficient. Received IV iron 125 mg daily for 5 days (12/7 - 12/11).    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  COPD  - albuterol inhaler PRN  - Flovent    Cachexia:  [ ]  f/u 12/20 24 hour urine creatinine for medicine dosing  ??  Tobacco dependence  - nicotine lozenges    FEN/GI/DVT  - GI: home PPI  - DVT: Lovenox 40 mg daily  ??  Code status: DNR and DNI; confirmed with patient on admission    Patient has Advanced Directive with [name] POA: [name] [phone])     Dispo: Med G, floor  __________________________________________________________    Labs/Studies:  Labs and Studies from the last 24hrs per EMR and Reviewed    Objective:  Temp:  [36.7 ??C-37.5 ??C] 37.4 ??C  Heart Rate:  [87-98] 95  Resp:  [18-24] 18  BP: (84-109)/(49-70) 108/70  SpO2:  [91 %-97 %] 97 %,     Intake/Output Summary (Last 24 hours) at 11/23/17 2043  Last data filed at 11/23/17 1836   Gross per 24 hour   Intake              480 ml   Output                0 ml   Net              480 ml        Gen: sitting up in bed, NAD  CV: normal S1, S2, RRR, no murmurs  Pulm: clear on the left, diminished on the right, no crackles or wheezes  Abd: soft, NT, ND  Extr: WWP, no tenderness of edema of lower extremities  Neuro: A&O x3

## 2017-11-24 NOTE — Unmapped (Signed)
Med G Daily Progress Note      Interval History:  Febrile to 38.2 overnight, received Tylenol. Visual symptoms have improved, still having some diplopia. Continuing to hold voriconazole though therapeutic levels on 12/20.    Assessment/Plan:    Principal Problem:    Fever  Active Problems:    Tobacco use disorder    Bronchiectasis without complication (RAF-HCC)    History of surgical procedure    COPD (chronic obstructive pulmonary disease) (CMS-HCC)    Post-thoracotomy pain syndrome    Cough    History of Mycobacterium kansasii infection  Resolved Problems:    * No resolved hospital problems. *    Mallory [name] is a 61 year old woman with a PMH of COPD, bronchiectasis, and M kansasii infection s/p resection, RULectomy and superior RLL segmentectomy (2013) who presented to [facility] on 10/31/2017 with fever and cough, found to have RML consolidation, concerning for infection, although no definitive organism has been identified, and she continues to be febrile despite anti-bacterial and anti-fungal agents.  ??  Persistent fever, in the setting of persistent RML and new RLL pneumonia  Rigid bronch 11/30 performed without fistula visualized. Continued to fever every night despite 2 weeks of IV meropenem (12/2 - 12/17). Lower respiratory cultures have grown single colonies of Aspergillus, which were not thought to be significant, and galactomannan was negative. CT chest demonstrated interval increase in patchy consolidations, GGO, and asymmetric interstitial opacities in the right lung base, as well as increase in upper RLL opacities. Started on voriconazole, but has been held in the setting of visual symptoms (12/12 - 12/17). Underwent bronch w/ BAL on 12/15 in order to continue the infectious workup, which thus far has been unrevealing (including fungal tests). CT chest 12/20 re-demonstrated right lower lobe consolidation. Rheumatology was consulted, and their index of suspicion for autoimmune disease is low. However, her ANA was positive with a speckled pattern. SPEP pattern demonstrated an irregularity in the gamma region, which may represent monoclonal protein. At this point, will need to pursue bronch with endobronchial biopsy for a definitive diagnosis.  - f/u ANCA  - f/u immunofixation electrophoresis  - f/u UPEP  - bronch w/ endobronchial biopsy 12/21  - continue to hold voriconazole  - airway clearance w/ HTS 3% nebs, Aerobika, and chest vest    Cachexia  Serum creatinine may be inaccurate in the setting of low BMI.  - f/u 24 hr urine creatinine    Elevated IgE  Almost 1000. No formal history of asthma. May consider low-dose steroids.  - Aspergillus specific precipitins  - comprehensive environmental panel    Thrombocytosis  Stable in the 800s.  - daily CBC    Chronic post-thoracotomy pain syndrome  Chronic pain was consulted and provided recommendations.  - Tylenol 1000 mg q6h PRN  - oxycodone 5-10 mg q6h PRN  - tizanidine 2-4 mg TID PRN  - lidocaine patches    Iron deficiency  Iron 17, transferrin 137.6, and ferritin only 180, so likely iron deficient. Received IV iron 125 mg daily for 5 days (12/7 - 12/11).    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  COPD  - albuterol inhaler PRN  - Flovent    Tobacco dependence  - nicotine lozenges    FEN/GI/DVT  - GI: home PPI  - DVT: Lovenox 40 mg daily  ??  Code status: DNR and DNI; confirmed with patient on admission    Patient has Advanced Directive with  [name]  Healthcare POA: [name] [phone])     Dispo: Med G, floor  __________________________________________________________    Labs/Studies:  Labs and Studies from the last 24hrs per EMR and Reviewed    Objective:  Temp:  [36.8 ??C-37.9 ??C] 37 ??C  Heart Rate:  [80-113] 83  Resp:  [18-24] 18  BP: (92-109)/(54-70) 101/62  SpO2:  [92 %-98 %] 94 %,     Intake/Output Summary (Last 24 hours) at 11/24/17 1323  Last data filed at 11/24/17 0154   Gross per 24 hour   Intake 240 ml   Output              300 ml   Net              -60 ml      Gen: sitting up in bed, NAD  CV: normal S1, S2, RRR, no murmurs  Pulm: clear on the left, slightly diminished on the right, but without crackles or wheezes  Abd: soft, NT, ND  Extr: WWP, no tenderness of edema of lower extremities  Neuro: A&O x3

## 2017-11-24 NOTE — Unmapped (Signed)
Problem: Patient Care Overview  Goal: Plan of Care Review  Outcome: Progressing  Patient AxOx4. VSS. On room air and continuous pulse ox. No calls from tele. SPO2 88% on monitor. Received prn oxycodone for pain and pain relieved. 24hr urine collection still in process of collecting overnight. Received chest CT. No acute issues overnight. Ambulated in the hallway. Family visited beginning of the shift.   Goal: Individualization and Mutuality  Outcome: Progressing    Goal: Discharge Needs Assessment  Outcome: Progressing    Goal: Interprofessional Rounds/Family Conf  Outcome: Progressing      Problem: Infection, Risk/Actual (Adult)  Goal: Infection Prevention/Resolution  Patient will demonstrate the desired outcomes by discharge/transition of care.   Outcome: Progressing      Problem: VTE, DVT and PE (Adult)  Goal: Signs and Symptoms of Listed Potential Problems Will be Absent, Minimized or Managed (VTE, DVT and PE)  Signs and symptoms of listed potential problems will be absent, minimized or managed by discharge/transition of care (reference VTE, DVT and PE (Adult) CPG).   Outcome: Progressing      Problem: Breathing Pattern Ineffective (Adult)  Goal: Effective Oxygenation/Ventilation  Patient will demonstrate the desired outcomes by discharge/transition of care.   Outcome: Progressing      Problem: Pain, Chronic (Adult)  Goal: Acceptable Pain/Comfort Level and Functional Ability  Patient will demonstrate the desired outcomes by discharge/transition of care.   Outcome: Progressing

## 2017-11-25 LAB — BASIC METABOLIC PANEL
ANION GAP: 11 mmol/L (ref 9–15)
BLOOD UREA NITROGEN: 4 mg/dL — ABNORMAL LOW (ref 7–21)
BUN / CREAT RATIO: 10
CHLORIDE: 104 mmol/L (ref 98–107)
CO2: 24 mmol/L (ref 22.0–30.0)
EGFR MDRD AF AMER: >=60 mL/min/{1.73_m2} (ref >=60)
EGFR MDRD NON AF AMER: >=60 mL/min/{1.73_m2} (ref >=60)
GLUCOSE RANDOM: 81 mg/dL (ref 65–179)
POTASSIUM: 4.8 mmol/L (ref 3.5–5.0)
SODIUM: 139 mmol/L (ref 135–145)

## 2017-11-25 LAB — CBC W/ AUTO DIFF
EOSINOPHILS ABSOLUTE COUNT: 0.3 10*9/L (ref 0.0–0.4)
HEMATOCRIT: 27.4 % — ABNORMAL LOW (ref 36.0–46.0)
HEMOGLOBIN: 8.5 g/dL — ABNORMAL LOW (ref 12.0–16.0)
LARGE UNSTAINED CELLS: 2 % (ref 0–4)
LYMPHOCYTES ABSOLUTE COUNT: 1.4 10*9/L — ABNORMAL LOW (ref 1.5–5.0)
MEAN CORPUSCULAR HEMOGLOBIN CONC: 31 g/dL (ref 31.0–37.0)
MEAN CORPUSCULAR HEMOGLOBIN: 30.9 pg (ref 26.0–34.0)
MEAN CORPUSCULAR VOLUME: 99.7 fL (ref 80.0–100.0)
MEAN PLATELET VOLUME: 7.4 fL (ref 7.0–10.0)
MONOCYTES ABSOLUTE COUNT: 0.7 10*9/L (ref 0.2–0.8)
PLATELET COUNT: 950 10*9/L — ABNORMAL HIGH (ref 150–440)
RED BLOOD CELL COUNT: 2.74 10*12/L — ABNORMAL LOW (ref 4.00–5.20)
RED CELL DISTRIBUTION WIDTH: 15.1 % — ABNORMAL HIGH (ref 12.0–15.0)
WBC ADJUSTED: 16.9 10*9/L — ABNORMAL HIGH (ref 4.5–11.0)

## 2017-11-25 LAB — MAGNESIUM: Magnesium:MCnc:Pt:Ser/Plas:Qn:: 1.7

## 2017-11-25 LAB — CREATININE TIMED URINE HOURS COLLECTED: Lab: 24

## 2017-11-25 LAB — EGFR MDRD NON AF AMER: Glomerular filtration rate/1.73 sq M.predicted.non black:ArVRat:Pt:Ser/Plas/Bld:Qn:Creatinine-based formula (MDRD): >=60

## 2017-11-25 LAB — PHOSPHORUS: Phosphate:MCnc:Pt:Ser/Plas:Qn:: 5 — ABNORMAL HIGH

## 2017-11-25 LAB — CREATININE, URINE, TIMED
CREATININE TIMED URINE VOLUME: 1700 mL
CREATININE TIMED URINE: 715.7 mg/(24.h) — ABNORMAL LOW (ref 800–2800)

## 2017-11-25 LAB — MEAN CORPUSCULAR HEMOGLOBIN: Lab: 30.9

## 2017-11-25 NOTE — Unmapped (Signed)
Vancomycin Initiation Pharmacy Note    Mallory [name] is a 61 y.o. female being initiated on vancomycin therapy for Suspected infection .    Goal trough: 15-20 mg/L    Pharmacokinetic Parameters:    Wt Readings from Last 1 Encounters:   11/15/17 46.8 kg (103 lb 3.2 oz)     Lab Results   Component Value Date    CREATININE 0.42 (L) 11/25/2017      Vd = 33.2 L, ke = 0.104 hr-1    Recommended Dose: 1250 mg IV q12h. Patient previously on vancomycin requiring 1250 mg q12h to achieve therapeutic levels.    Estimated Peak: 43 mg/L  Estimated trough: 16 mg/L    Pharmacy will continue to monitor and order levels as appropriate.  Patient will be followed for changes in renal function, toxicity, and efficacy.  Please page service pharmacist with questions/clarifications.    Danne Harbor,  PharmD

## 2017-11-25 NOTE — Unmapped (Signed)
Med G Daily Progress Note      Interval History:  Febrile to 39 overnight following bronch, cultured and meropenem restarted. Feeling well this morning, ambulating in halls. Plan to treat empirically with broad spectrum ABx and posaconazole, f/u with ID at [facility].     Assessment/Plan:    Principal Problem:    Fever  Active Problems:    Tobacco use disorder    Bronchiectasis without complication (RAF-HCC)    History of surgical procedure    COPD (chronic obstructive pulmonary disease) (CMS-HCC)    Post-thoracotomy pain syndrome    Cough    History of Mycobacterium kansasii infection  Resolved Problems:    * No resolved hospital problems. *    Mallory [name] is a 61 year old woman with a PMH of COPD, bronchiectasis, and M kansasii infection s/p resection, RULectomy and superior RLL segmentectomy (2013) who presented to [facility] on 10/31/2017 with fever and cough, found to have RML consolidation, concerning for infection, although no definitive organism has been identified, and she continues to be febrile despite anti-bacterial and anti-fungal agents.  ??  Persistent fever, in the setting of persistent RML and new RLL pneumonia  Rigid bronch 11/30 performed without fistula visualized. Continued to fever every night despite 2 weeks of IV meropenem (12/2 - 12/17). Lower respiratory cultures have grown single colonies of Aspergillus, which were not thought to be significant, and galactomannan was negative. CT chest demonstrated interval increase in patchy consolidations, GGO, and asymmetric interstitial opacities in the right lung base, as well as increase in upper RLL opacities. Started on voriconazole, but has been held in the setting of visual symptoms (12/12 - 12/17). Underwent bronch w/ BAL on 12/15 in order to continue the infectious workup, which thus far has been unrevealing (including fungal tests). CT chest 12/20 re-demonstrated right lower lobe consolidation. Rheumatology was consulted, and their index of suspicion for autoimmune disease is low. However, her ANA was positive with a speckled pattern. SPEP pattern demonstrated an irregularity in the gamma region, which may represent monoclonal protein. S/p bronch with endobronchial biopsy 12/21  - ANCA negative  - f/u immunofixation electrophoresis  - f/u UPEP  - bronch w/ endobronchial biopsy 12/21, f/u path, gram stain no organisms seen  - Start posaconazole, vancomycin, cefepime, flagyl for prolonged course per ID with plans for outpt f/u with [facility] ID to assess for clinical improvement (12/22 - )  - airway clearance w/ HTS 3% nebs, Aerobika, and chest vest    Cachexia  Serum creatinine may be inaccurate in the setting of low BMI.  - 24 hr urine creatinine clearance 118    Elevated IgE  Almost 1000. No formal history of asthma. May consider low-dose steroids.  - Aspergillus specific precipitins  - comprehensive environmental panel    Thrombocytosis  Stable in the 800s.  - daily CBC    Chronic post-thoracotomy pain syndrome  Chronic pain was consulted and provided recommendations.  - Tylenol 1000 mg q6h PRN  - oxycodone 5-10 mg q6h PRN  - tizanidine 2-4 mg TID PRN  - lidocaine patches    Iron deficiency  Iron 17, transferrin 137.6, and ferritin only 180, so likely iron deficient. Received IV iron 125 mg daily for 5 days (12/7 - 12/11).    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  COPD  - albuterol inhaler PRN  - Flovent    Tobacco dependence  - nicotine lozenges    FEN/GI/DVT  -  GI: home PPI  - DVT: Lovenox 40 mg daily  ??  Code status: DNR and DNI; confirmed with patient on admission    Patient has Advanced Directive with [name]  Healthcare POA: [name] [phone])     Dispo: Med G, floor  __________________________________________________________    Labs/Studies:  Labs and Studies from the last 24hrs per EMR and Reviewed    Objective:  Temp:  [37 ??C-39 ??C] (P) 37.3 ??C  Heart Rate:  [83-116] 105  SpO2 Pulse: [104-115] 104  Resp:  [12-34] 18  BP: (87-133)/(55-94) 112/61  FiO2 (%):  [100 %] 100 %  SpO2:  [82 %-100 %] 93 %,     Intake/Output Summary (Last 24 hours) at 11/25/17 0803  Last data filed at 11/24/17 2000   Gross per 24 hour   Intake              220 ml   Output              300 ml   Net              -80 ml      Gen: sitting up in bed, NAD  CV: normal S1, S2, RRR, no murmurs  Pulm: clear on the left, slightly diminished on the right, but without crackles or wheezes  Abd: soft, NT, ND  Extr: WWP, no tenderness of edema of lower extremities  Neuro: A&O x3

## 2017-11-25 NOTE — Unmapped (Signed)
Problem: Patient Care Overview  Goal: Plan of Care Review  Outcome: Progressing   11/24/17 1750   OTHER   Plan of Care Reviewed With patient   Plan of Care Review   Progress improving   Patient remained afebrile this shift, denies any difficulty of breathing on room air, had bronchoscopy this afternoon and was able to tolerate the procedure. Will continue to monitor.  Goal: Individualization and Mutuality  Outcome: Progressing    Goal: Discharge Needs Assessment  Outcome: Progressing    Goal: Interprofessional Rounds/Family Conf  Outcome: Progressing   11/24/17 1643 11/24/17 1644   OTHER   Clinical EDD (Estimated Discharge Date)  --  11/29/17   Interdisciplinary Rounds/Family Conf   Participants case manager;nursing;pharmacy;physician --        Problem: Infection, Risk/Actual (Adult)  Goal: Infection Prevention/Resolution  Patient will demonstrate the desired outcomes by discharge/transition of care.   Outcome: Progressing   11/22/17 1509   Infection, Risk/Actual (Adult)   Infection Prevention/Resolution making progress toward outcome       Problem: VTE, DVT and PE (Adult)  Goal: Signs and Symptoms of Listed Potential Problems Will be Absent, Minimized or Managed (VTE, DVT and PE)  Signs and symptoms of listed potential problems will be absent, minimized or managed by discharge/transition of care (reference VTE, DVT and PE (Adult) CPG).   Outcome: Progressing   11/22/17 1509   VTE, DVT and PE (Adult)   Problems Assessed (VTE, DVT, PE) all   Problems Present (VTE, DVT, PE) none       Problem: Breathing Pattern Ineffective (Adult)  Goal: Effective Oxygenation/Ventilation  Patient will demonstrate the desired outcomes by discharge/transition of care.   Outcome: Progressing   11/22/17 1509   Breathing Pattern Ineffective (Adult)   Effective Oxygenation/Ventilation making progress toward outcome       Problem: Pain, Chronic (Adult)  Goal: Acceptable Pain/Comfort Level and Functional Ability  Patient will demonstrate the desired outcomes by discharge/transition of care.   Outcome: Progressing   11/22/17 1509   Pain, Chronic (Adult)   Acceptable Pain/Comfort Level and Functional Ability making progress toward outcome

## 2017-11-25 NOTE — Unmapped (Signed)
Problem: Patient Care Overview  Goal: Plan of Care Review  Outcome: Progressing   11/25/17 0655   OTHER   Plan of Care Reviewed With patient   Plan of Care Review   Progress improving     Patient AOX4, VSS throughout shift with Tmax 38.4C. Tylenol given for relief. Patient c/o pain relieved by oxycodone that was reordered by provider. All medications given on time as ordered.  Patient c/o not being cared for properly since she wasn't told about her pain medications being d/c and the provider did not come see her last night. Continue care and active listening was done to promote the best care for patient. Patient resting in bed. POC maintained. Will continue to monitor.         Problem: Breathing Pattern Ineffective (Adult)  Intervention: Monitor/Manage Contributing Psychosocial Factors   11/25/17 0655   Interventions   Supportive Measures active listening utilized;goal setting facilitated   Family/Support System Care support provided

## 2017-11-26 LAB — CBC W/ AUTO DIFF
BASOPHILS ABSOLUTE COUNT: 0 10*9/L (ref 0.0–0.1)
EOSINOPHILS ABSOLUTE COUNT: 0.4 10*9/L (ref 0.0–0.4)
HEMATOCRIT: 26.9 % — ABNORMAL LOW (ref 36.0–46.0)
HEMOGLOBIN: 8.3 g/dL — ABNORMAL LOW (ref 12.0–16.0)
LYMPHOCYTES ABSOLUTE COUNT: 0.9 10*9/L — ABNORMAL LOW (ref 1.5–5.0)
MEAN CORPUSCULAR HEMOGLOBIN CONC: 30.9 g/dL — ABNORMAL LOW (ref 31.0–37.0)
MEAN CORPUSCULAR HEMOGLOBIN: 30.9 pg (ref 26.0–34.0)
MEAN PLATELET VOLUME: 7.7 fL (ref 7.0–10.0)
MONOCYTES ABSOLUTE COUNT: 0.5 10*9/L (ref 0.2–0.8)
PLATELET COUNT: 889 10*9/L — ABNORMAL HIGH (ref 150–440)
RED BLOOD CELL COUNT: 2.7 10*12/L — ABNORMAL LOW (ref 4.00–5.20)
RED CELL DISTRIBUTION WIDTH: 15.1 % — ABNORMAL HIGH (ref 12.0–15.0)
WBC ADJUSTED: 7.7 10*9/L (ref 4.5–11.0)

## 2017-11-26 LAB — BASIC METABOLIC PANEL
ANION GAP: 7 mmol/L — ABNORMAL LOW (ref 9–15)
BLOOD UREA NITROGEN: 6 mg/dL — ABNORMAL LOW (ref 7–21)
BUN / CREAT RATIO: 15
CALCIUM: 8.9 mg/dL (ref 8.5–10.2)
CO2: 24 mmol/L (ref 22.0–30.0)
CREATININE: 0.4 mg/dL — ABNORMAL LOW (ref 0.60–1.00)
EGFR MDRD AF AMER: >=60 mL/min/{1.73_m2} (ref >=60)
EGFR MDRD NON AF AMER: >=60 mL/min/{1.73_m2} (ref >=60)
GLUCOSE RANDOM: 99 mg/dL (ref 65–179)
SODIUM: 140 mmol/L (ref 135–145)

## 2017-11-26 LAB — PHOSPHORUS: Phosphate:MCnc:Pt:Ser/Plas:Qn:: 4.5

## 2017-11-26 LAB — CO2: Carbon dioxide:SCnc:Pt:Ser/Plas:Qn:: 24

## 2017-11-26 LAB — MACROCYTES

## 2017-11-26 LAB — MAGNESIUM: Magnesium:MCnc:Pt:Ser/Plas:Qn:: 1.8

## 2017-11-26 NOTE — Unmapped (Signed)
Problem: Patient Care Overview  Goal: Plan of Care Review  Outcome: Progressing   11/25/17 1844   OTHER   Plan of Care Reviewed With patient   Plan of Care Review   Progress improving   Patient alert and oriented x4, afebrile the whole shift, IV antibiotics given as scheduled, no adverse reactions noted. Will continue to monitor.  Goal: Individualization and Mutuality  Outcome: Progressing    Goal: Discharge Needs Assessment  Outcome: Progressing    Goal: Interprofessional Rounds/Family Conf  Outcome: Progressing      Problem: Infection, Risk/Actual (Adult)  Goal: Infection Prevention/Resolution  Patient will demonstrate the desired outcomes by discharge/transition of care.   Outcome: Progressing      Problem: VTE, DVT and PE (Adult)  Goal: Signs and Symptoms of Listed Potential Problems Will be Absent, Minimized or Managed (VTE, DVT and PE)  Signs and symptoms of listed potential problems will be absent, minimized or managed by discharge/transition of care (reference VTE, DVT and PE (Adult) CPG).   Outcome: Progressing   11/22/17 1509   VTE, DVT and PE (Adult)   Problems Assessed (VTE, DVT, PE) all   Problems Present (VTE, DVT, PE) none       Problem: Breathing Pattern Ineffective (Adult)  Goal: Effective Oxygenation/Ventilation  Patient will demonstrate the desired outcomes by discharge/transition of care.   Outcome: Progressing   11/22/17 1509   Breathing Pattern Ineffective (Adult)   Effective Oxygenation/Ventilation making progress toward outcome       Problem: Pain, Chronic (Adult)  Goal: Acceptable Pain/Comfort Level and Functional Ability  Patient will demonstrate the desired outcomes by discharge/transition of care.   Outcome: Progressing   11/22/17 1509   Pain, Chronic (Adult)   Acceptable Pain/Comfort Level and Functional Ability making progress toward outcome

## 2017-11-26 NOTE — Unmapped (Signed)
Problem: Patient Care Overview  Goal: Plan of Care Review  Outcome: Progressing   11/26/17 0613   OTHER   Plan of Care Reviewed With patient   Plan of Care Review   Progress improving     Patient AOX4, VSS throughout shift with Tmax 38.4C. Tylenol given for pain relief last night.  Patient c/o pain again relieved by oxycodone. BP's ran soft this morning, provider notified and ordered 1L LR bolus.  Patient remains asymptomatic.  All medications given on time as ordered. Patient resting in bed. POC maintained. Will continue to monitor.

## 2017-11-26 NOTE — Unmapped (Signed)
Problem: Breathing Pattern Ineffective (Adult)  Goal: Effective Oxygenation/Ventilation  Patient will demonstrate the desired outcomes by discharge/transition of care.   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 She also has a portable vest and wants to use it but not ours.

## 2017-11-26 NOTE — Unmapped (Signed)
Med G Daily Progress Note      Interval History: Softer BPs overnight but asymptomatic. Received 1L bolus. Today she is feeling lower energy. Afebrile. Doesn't remember how to use Vest but talked to technician who will come for training tomorrow.      Assessment/Plan:    Principal Problem:    Fever  Active Problems:    Tobacco use disorder    Bronchiectasis without complication (RAF-HCC)    History of surgical procedure    COPD (chronic obstructive pulmonary disease) (CMS-HCC)    Post-thoracotomy pain syndrome    Cough    History of Mycobacterium kansasii infection  Resolved Problems:    * No resolved hospital problems. *    Mallory [name] is a 61 year old woman with a PMH of COPD, bronchiectasis, and M kansasii infection s/p resection, RULectomy and superior RLL segmentectomy (2013) who presented to [facility] on 10/31/2017 with fever and cough, found to have RML consolidation, concerning for infection, although no definitive organism has been identified, and she continues to be febrile despite anti-bacterial and anti-fungal agents.  ??  Persistent fever, in the setting of persistent RML and new RLL pneumonia  Rigid bronch 11/30 performed without fistula visualized. Continued to fever every night despite 2 weeks of IV meropenem (12/2 - 12/17). Lower respiratory cultures have grown single colonies of Aspergillus, which were not thought to be significant, and galactomannan was negative. CT chest demonstrated interval increase in patchy consolidations, GGO, and asymmetric interstitial opacities in the right lung base, as well as increase in upper RLL opacities. Started on voriconazole, but has been held in the setting of visual symptoms (12/12 - 12/17). Underwent bronch w/ BAL on 12/15 in order to continue the infectious workup, which thus far has been unrevealing (including fungal tests). CT chest 12/20 re-demonstrated right lower lobe consolidation. Rheumatology was consulted, and their index of suspicion for autoimmune disease is low. However, her ANA was positive with a speckled pattern. SPEP pattern demonstrated an irregularity in the gamma region, which may represent monoclonal protein. S/p bronch with endobronchial biopsy 12/21  - ANCA negative  - f/u immunofixation electrophoresis  - f/u UPEP  - bronch w/ endobronchial biopsy 12/21, f/u path, gram stain no organisms seen  - Start posaconazole, vancomycin, cefepime, flagyl for prolonged course per ID with plans for outpt f/u with [facility] ID to assess for clinical improvement (12/22 - )  - airway clearance w/ HTS 3% nebs, Aerobika, and chest vest    Cachexia  Serum creatinine may be inaccurate in the setting of low BMI.  - 24 hr urine creatinine clearance 118    Elevated IgE  Almost 1000. No formal history of asthma. May consider low-dose steroids.  - Aspergillus specific precipitins  - comprehensive environmental panel    Thrombocytosis  Stable in the 800s.  - daily CBC    Chronic post-thoracotomy pain syndrome  Chronic pain was consulted and provided recommendations.  - Tylenol 1000 mg q6h PRN  - oxycodone 5-10 mg q6h PRN  - tizanidine 2-4 mg TID PRN  - lidocaine patches    Iron deficiency  Iron 17, transferrin 137.6, and ferritin only 180, so likely iron deficient. Received IV iron 125 mg daily for 5 days (12/7 - 12/11).    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  COPD  - albuterol inhaler PRN  - Flovent    Tobacco dependence  - nicotine lozenges    FEN/GI/DVT  - GI:  home PPI  - DVT: Lovenox 40 mg daily  ??  Code status: DNR and DNI; confirmed with patient on admission    Patient has Advanced Directive with [name]  Healthcare POA: [name] [phone])     Dispo: Med G, floor  __________________________________________________________    Labs/Studies:  Labs and Studies from the last 24hrs per EMR and Reviewed    Objective:  Temp:  [36.7 ??C-37.5 ??C] 36.7 ??C  Heart Rate:  [69-99] 69  Resp:  [16-18] 18  BP: (86-112)/(53-72) 87/58  SpO2:  [94 %-100 %] 99 %,     Intake/Output Summary (Last 24 hours) at 11/26/17 0817  Last data filed at 11/25/17 2231   Gross per 24 hour   Intake           1167.5 ml   Output              500 ml   Net            667.5 ml      Gen: sitting up in bed, NAD  CV: normal S1, S2, RRR, no murmurs  Pulm: clear on the left, slightly diminished on the right, but without crackles or wheezes  Abd: soft, NT, ND  Extr: WWP, no tenderness of edema of lower extremities  Neuro: A&O x3

## 2017-11-26 NOTE — Unmapped (Signed)
GENERAL INFECTIOUS DISEASES TEAM CONSULT FOLLOW UP NOTE  ??  ??  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 In addition she has had worsening CT scan (R middle lobe consolidation and increasing RLL consolidations) while on meropenem x almost two weeks, as well positive BAL(11/30) and sputum (12/10) cultures ??for A.fumigatus.    She was started on voriconazole on 12/12 but developed visual symptoms in the setting of high levels, so it was stopped. A repeat CT chest showed stable findings overall, but some worsening of the RLL consolidation (reviewed with radiologist). She had repeat bronch w/ biopsy on 11/24/17. Will follow up cultures and biopsy results. In the meantime, would start broad spectrum coverage (bacterial and fungal) to see if we can achieve any improvement.   ??  ID Problem List:  1. Fever/R middle and lower lobe infiltrates, with worsening of CT scan findings while on meropenem and positive BAL cxs for Aspergillus fumigatus    2. Hx of pulmonary M.kansasii infection in 2013 s/p RUL lobectomy and antimicrobial therapy with multiple negative AFB sputum cultures subsequently    3. COPD/bronchiectasis  ??  ??  Recommendations:  - follow up bronch studies and biopsy  - stop meropenem  - start cefepime, flagyl, and vanc  - start posaconazole  ??  ??  The General ID service Team A will continue to follow. Patient seen and discussed with Dr. Noel Gerold.   ??  Laneta Simmers, MD  [facility]  ??  ??  Interval events and ROS:  Febrile overnight so started on meropenem. Patient still with productive cough. Has small appetite. Still with intermittent disabling muscle spasms.  ??  Allergies:  Diphenhydramine; Pseudoephedrine hcl; Levofloxacin; Alendronate sodium; Diphenhydramine hcl; Pheniramine; Pheniramine maleate; Pseudoephedrine; Tolmetin; and Codeine-guaifenesin  ??  ??  Medications:??  Current antimicrobials:  Meropenem (12/2-12/17) mero 12/21 -     Previous antimicrobials:  Unasyn  voro    Other medications reviewed.   ??  ??  Objective:??  Vitals:    11/25/17 1000   BP: 112/72   Pulse: 92   Resp: 18   Temp: 37.3 ??C   SpO2: 94%     ??  Physical exam   General: in no acute distress, cachectic,  HEENT: anicteric conjunctivae  LUNGS: normal work of breathing  SKIN: no rash or skin breakdown on clothed exam  NEUROLOGICAL: alert and oriented x3  PSYCH: interactive  LINES: appear non-infected  ??  ??  Labs:????  ??  Lab Results   Component Value Date    WBC 16.9 (H) 11/25/2017    HGB 8.5 (L) 11/25/2017    HCT 27.4 (L) 11/25/2017    PLT 950 (H) 11/25/2017       Lab Results   Component Value Date    NA 139 11/25/2017    K 4.8 11/25/2017    CL 104 11/25/2017    CO2 24.0 11/25/2017    BUN 4 (L) 11/25/2017    CREATININE 0.42 (L) 11/25/2017    GLU 81 11/25/2017    CALCIUM 8.6 11/25/2017    MG 1.7 11/25/2017    PHOS 5.0 (H) 11/25/2017       Lab Results   Component Value Date    BILITOT 0.5  11/20/2017    BILIDIR 0.50 (H) 11/20/2017    PROT 5.7 (L) 11/22/2017    ALBUMIN 2.0 (L) 11/22/2017    ALT 28 11/20/2017    AST 28 11/20/2017    ALKPHOS 102 11/20/2017       Lab Results   Component Value Date    INR 1.26 11/02/2017    APTT 31.7 10/31/2017     ??  Galactomannan (serum) <0.5 (12/11)  Galactomannan (BAL) <0.5 (12/15)  Beta D glucan <31 (12/11)  ??  Drug monitoring:  Lab Results   Component Value Date    VANCOTROUGH 9.4 (L) 11/10/2017    VORILVL 0.5 (L) 11/24/2017    VORILVL 4.5 11/22/2017   ??  Microbiology:    Microbiology Results (last day)     Procedure Component Value Date/Time Date/Time    Fungal Culture [[id number]] Collected:  11/18/17 0958    Lab Status:  Preliminary result Specimen:  Lavage, Bronchial from Lung, Right Lower Lobe Updated:  11/24/17 1103     Fungal Pathogen Screen NEGATIVE TO DATE     Fungus Stain NO FUNGI SEEN    Narrative:       Specimen Source: Lung, Right Lower Lobe    Fungal Culture [[id number]] Collected:  11/18/17 0958    Lab Status:  Preliminary result Specimen:  Washing, Bronchial from Lung, Right Mainstem Updated:  11/24/17 1103     Fungal Pathogen Screen NEGATIVE TO DATE     Fungus Stain NO FUNGI SEEN    Narrative:       Specimen Source: Lung, Right Mainstem    Fungal Culture [[id number]] Collected:  11/03/17 1152    Lab Status:  Final result Specimen:  Washing, Bronchial from Lung, Combined Updated:  11/24/17 0734     Fungal Pathogen Screen NO FUNGUS ISOLATED      Fungus Stain NO FUNGI SEEN    Narrative:       Specimen Source: Lung, Combined          BAL cx: 12/15 (AFB, fungal: ngtd)  ??  Studies:??    CT chest 12/20  Impression       - New right lower lobe airspace consolidation, concerning for infection.  - Cystic lesion in the right superior hemithorax may be secondary to bronchopleural fistula  ??         Ct Chest Wo Contrast  ??  Result Date: 11/16/2017  EXAM: CT CHEST WO CONTRAST DATE: 11/16/2017 4:03 PM ACCESSION: [id number] UN DICTATED: 11/16/2017 4:08 PM INTERPRETATION LOCATION: [facility]   ??  CLINICAL INDICATION: 61 years old Female with Other-follow-up chronic pneumonia- ??  ??  COMPARISON: Chest CT 11/05/2017 TECHNIQUE: A spiral CT scan was obtained without IV contrast from the thoracic inlet through the hemidiaphragms. Images were reconstructed in the axial plane. ??Coronal and sagittal reformatted images of the chest were also provided for further evaluation of the lung parenchyma.   ??  FINDINGS: Left PICC terminates in right atrium.   ??  AIRWAYS, LUNGS, PLEURA: Emphysematous changes. Post right upper lobectomy. Right apical thick-walled cystic lesion similar to prior. Slight interval worsening in consolidation of the superior aspect of the right lower lobe. Short interval increase in patchy airspace opacities with adjacent groundglass and asymmetric intralobular septal thickening in the base of the right lower lobe. New foci of endobronchial opacification in right lower lobe.   ??  MEDIASTINUM: Normal heart size. No pericardial effusion. Normal caliber thoracic aorta. Aortic and coronary artery calcifications.   ??  Right paratracheal lymphadenopathy,  with increase in right superior paratracheal region, up to 1.1 cm, previously subcentimeter, (2:31.   ??  IMAGED ABDOMEN: Unremarkable.   ??  SOFT TISSUES: Unremarkable.   ??  BONES: Unchanged chronic changes in the right lateral second and sixth ribs. 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   ??  ??  ??  --Short interval increase in patchy consolidations, groundglass opacities, and asymmetric interstitial opacities in the right lung base could reflect worsening multifocal pneumonia, superimposed acute aspiration (particularly given new RLL endobronchial opacification), and/or asymmetric edema.   ??  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 Attention on follow-up.   ??  --Stable to slight increase in consolidative opacities in the upper portion of the right lower lobe.

## 2017-11-26 NOTE — Unmapped (Signed)
Patient was not feeling well enough this morning to take treatments, was busy talking to team this afternoon, and for the 1600 treatments requested not to be bothered while sleeping.

## 2017-11-27 DIAGNOSIS — J470 Bronchiectasis with acute lower respiratory infection: Principal | ICD-10-CM

## 2017-11-27 LAB — BASIC METABOLIC PANEL
ANION GAP: 10 mmol/L (ref 9–15)
BLOOD UREA NITROGEN: 5 mg/dL — ABNORMAL LOW (ref 7–21)
BUN / CREAT RATIO: 11
CALCIUM: 8.7 mg/dL (ref 8.5–10.2)
CHLORIDE: 107 mmol/L (ref 98–107)
CO2: 24 mmol/L (ref 22.0–30.0)
CREATININE: 0.45 mg/dL — ABNORMAL LOW (ref 0.60–1.00)
EGFR MDRD NON AF AMER: >=60 mL/min/{1.73_m2} (ref >=60)
GLUCOSE RANDOM: 87 mg/dL (ref 65–179)
POTASSIUM: 3.9 mmol/L (ref 3.5–5.0)

## 2017-11-27 LAB — BUN / CREAT RATIO: Urea nitrogen/Creatinine:MRto:Pt:Ser/Plas:Qn:: 11

## 2017-11-27 LAB — CBC W/ AUTO DIFF
BASOPHILS ABSOLUTE COUNT: 0 10*9/L (ref 0.0–0.1)
EOSINOPHILS ABSOLUTE COUNT: 0.4 10*9/L (ref 0.0–0.4)
HEMATOCRIT: 25.4 % — ABNORMAL LOW (ref 36.0–46.0)
LARGE UNSTAINED CELLS: 3 % (ref 0–4)
LYMPHOCYTES ABSOLUTE COUNT: 1 10*9/L — ABNORMAL LOW (ref 1.5–5.0)
MEAN CORPUSCULAR HEMOGLOBIN CONC: 31.2 g/dL (ref 31.0–37.0)
MEAN CORPUSCULAR HEMOGLOBIN: 30.3 pg (ref 26.0–34.0)
MEAN CORPUSCULAR VOLUME: 97.1 fL (ref 80.0–100.0)
MEAN PLATELET VOLUME: 7.5 fL (ref 7.0–10.0)
MONOCYTES ABSOLUTE COUNT: 0.5 10*9/L (ref 0.2–0.8)
NEUTROPHILS ABSOLUTE COUNT: 6.2 10*9/L (ref 2.0–7.5)
RED BLOOD CELL COUNT: 2.62 10*12/L — ABNORMAL LOW (ref 4.00–5.20)
RED CELL DISTRIBUTION WIDTH: 15.3 % — ABNORMAL HIGH (ref 12.0–15.0)
WBC ADJUSTED: 8.4 10*9/L (ref 4.5–11.0)

## 2017-11-27 LAB — PHOSPHORUS
PHOSPHORUS: 5 mg/dL — ABNORMAL HIGH (ref 2.9–4.7)
Phosphate:MCnc:Pt:Ser/Plas:Qn:: 5 — ABNORMAL HIGH

## 2017-11-27 LAB — VANCOMYCIN TROUGH: Vancomycin^trough:MCnc:Pt:Ser/Plas:Qn:: 13.2

## 2017-11-27 LAB — MACROCYTES

## 2017-11-27 LAB — MAGNESIUM: Magnesium:MCnc:Pt:Ser/Plas:Qn:: 1.7

## 2017-11-27 MED ORDER — METRONIDAZOLE 500 MG TABLET
ORAL_TABLET | Freq: Three times a day (TID) | ORAL | 0 refills | 0.00000 days | Status: CP
Start: 2017-11-27 — End: 2017-11-30

## 2017-11-27 MED ORDER — POSACONAZOLE 100 MG TABLET,DELAYED RELEASE
ORAL_TABLET | Freq: Every day | ORAL | 0 refills | 0 days | Status: CP
Start: 2017-11-27 — End: 2017-11-30

## 2017-11-27 MED FILL — NOXAFIL DR 100 MG TABLET: 100 | 28 days supply | Qty: 84 | Fill #0

## 2017-11-27 MED FILL — metroNIDAZOLE 500 MG TABS: 500 | 28 days supply | Qty: 84 | Fill #0

## 2017-11-27 NOTE — Unmapped (Signed)
Med G Daily Progress Note      Interval History: Low grade fever to 38.3 yesterday evening. C/o dyspnea w/ exertion yesterday and this morning which is intermittent, no associated chest pain or palpitations and remains stable on RA. Frustrated by long hospitalization.    Assessment/Plan:    Principal Problem:    Fever  Active Problems:    Tobacco use disorder    Bronchiectasis without complication (RAF-HCC)    History of surgical procedure    COPD (chronic obstructive pulmonary disease) (CMS-HCC)    Post-thoracotomy pain syndrome    Cough    History of Mycobacterium kansasii infection    Pneumonia of right lung due to infectious organism    Bronchopleural fistula (CMS-HCC)  Resolved Problems:    * No resolved hospital problems. *    Mallory [name] is a 61 year old woman with a PMH of COPD, bronchiectasis, and M kansasii infection s/p resection, RULectomy and superior RLL segmentectomy (2013) who presented to [facility] on 10/31/2017 with fever and cough, found to have RML consolidation, concerning for infection, although no definitive organism has been identified, and she continues to be febrile despite anti-bacterial and anti-fungal agents.  ??  Persistent fever, in the setting of persistent RML and new RLL pneumonia  Rigid bronch 11/30 performed without fistula visualized. Continued to fever every night despite 2 weeks of IV meropenem (12/2 - 12/17). Lower respiratory cultures have grown single colonies of Aspergillus, which were not thought to be significant, and galactomannan was negative. CT chest demonstrated interval increase in patchy consolidations, GGO, and asymmetric interstitial opacities in the right lung base, as well as increase in upper RLL opacities. Started on voriconazole, but has been held in the setting of visual symptoms (12/12 - 12/17). Underwent bronch w/ BAL on 12/15 in order to continue the infectious workup, which thus far has been unrevealing (including fungal tests). CT chest 12/20 re-demonstrated right lower lobe consolidation. Rheumatology was consulted, and their index of suspicion for autoimmune disease is low. However, her ANA was positive with a speckled pattern. SPEP pattern demonstrated an irregularity in the gamma region, which may represent monoclonal protein. S/p bronch with endobronchial biopsy 12/21  - ANCA negative  - f/u immunofixation electrophoresis  - f/u UPEP  - bronch w/ endobronchial biopsy 12/21, f/u path, gram stain no organisms seen, f/u path  - Start posaconazole, vancomycin, cefepime, flagyl for prolonged course per ID with plans for outpt f/u with Dr. Garner Nash as well as [facility] to assess for clinical improvement (12/22 - )  - airway clearance w/ HTS 3% nebs, Aerobika, and chest vest  - Obtaining PVLs to r/o DVT given low grade fevers and dyspnea in setting of prolonged hospitalization, intermittently refusing lovenox    Cachexia  Serum creatinine may be inaccurate in the setting of low BMI.  - 24 hr urine creatinine clearance normal at 118    Elevated IgE  Almost 1000. No formal history of asthma. May consider low-dose steroids.  [ ]  Aspergillus specific precipitins  - comprehensive environmental panel    Thrombocytosis  Stable in the 800s.  - daily CBC    Chronic post-thoracotomy pain syndrome  Chronic pain was consulted and provided recommendations.  - Tylenol 1000 mg q6h PRN  - oxycodone 5-10 mg q6h PRN  - tizanidine 2-4 mg TID PRN  - lidocaine patches    Iron deficiency  Iron 17, transferrin 137.6, and ferritin only 180, so likely iron deficient. Received IV iron 125 mg daily for 5 days (  12/7 - 12/11).    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  COPD  - albuterol inhaler PRN  - Flovent    Tobacco dependence  - nicotine lozenges    FEN/GI/DVT  - GI: home PPI  - DVT: Lovenox 40 mg daily  ??  Code status: DNR and DNI; confirmed with patient on admission    Patient has Advanced Directive with [name]  Healthcare POA: [name] [phone])     Dispo: Med G, floor  __________________________________________________________    Labs/Studies:  Labs and Studies from the last 24hrs per EMR and Reviewed    Objective:  Temp:  [36.7 ??C-38.3 ??C] 36.9 ??C  Heart Rate:  [73-100] 81  Resp:  [18-24] 18  BP: (83-106)/(43-69) 106/60  SpO2:  [93 %-97 %] 95 %,     Intake/Output Summary (Last 24 hours) at 11/27/17 0740  Last data filed at 11/26/17 2222   Gross per 24 hour   Intake              200 ml   Output                0 ml   Net              200 ml      Gen: sitting up in bed, NAD  CV: normal S1, S2, RRR, no murmurs  Pulm: clear on the left, slightly diminished on the right, breathing comfortably on RA  Abd: soft, NT, ND  Extr: WWP, no tenderness or edema of lower extremities  Neuro: A&O x3

## 2017-11-27 NOTE — Unmapped (Signed)
Problem: Pneumonia (Adult)  Goal: Signs and Symptoms of Listed Potential Problems Will be Absent, Minimized or Managed (Pneumonia)  Signs and symptoms of listed potential problems will be absent, minimized or managed by discharge/transition of care (reference Pneumonia (Adult) CPG).   Pt is awaiting training on home vest. She did her tx this morning with no problems.

## 2017-11-27 NOTE — Unmapped (Signed)
Problem: Patient Care Overview  Goal: Plan of Care Review  Outcome: Progressing   11/27/17 0524   OTHER   Plan of Care Reviewed With patient   Plan of Care Review   Progress improving     Patient Mallory [name], VSS throughout shift with Tmax 38.3C. Tylenol given for fever relief last night.  Patient c/o pain relieved by oxycodone. BP's ran soft this morning, provider notified. BP recheck a few hours later and was WDL.  Patient remains asymptomatic.  All medications given on time as ordered. Morning labs drawn off central line. Patient tolerated abx well. Patient resting in bed. POC maintained. Will continue to monitor.         Problem: Breathing Pattern Ineffective (Adult)  Intervention: Optimize Oxygenation/Ventilation/Perfusion   11/27/17 0524   Interventions   Airway/Ventilation Management airway patency maintained   Breathing Techniques/Airway Clearance deep/controlled cough encouraged   Head of Bed (HOB) HOB at 30 degrees         Problem: Pain, Chronic (Adult)  Intervention: Mutually Develop/Implement Persistent Pain Management Plan   11/27/17 0524   Interventions   Pain Management Interventions breathing exercises;care clustered   Sensory Stimulation Regulation auditory stimulation minimized;lighting decreased

## 2017-11-27 NOTE — Unmapped (Signed)
Telemetry Thayer Ohm notified that pt going off floor for test.

## 2017-11-27 NOTE — Unmapped (Signed)
Vancomycin Follow-up Pharmacy Note    Mallory [name] is a 61 y.o. female on vancomycin 1250 mg IV q12h for Pneumonia with goal trough of 10-20 mg/L. Currently on day 3 of therapy.    Lab Results   Component Value Date    CREATININE 0.45 (L) 11/27/2017     Vancomycin level: 13.2 mg/L (drawn appropriately)  True trough:  13.2 mg/L    Based on level, recommend to continue at 1250 mg every 12 hours.  Considering patient's weight and age, I worry that increasing the dose will give too high a peak or start to accumulate.  Will keep same dose with suspicion patient will start to accumulate.    Pharmacy will continue to monitor and order levels as appropriate.  Patient will be followed for changes in renal function, toxicity, and efficacy.  Please page service pharmacist with questions/clarifications.    Danne Harbor,  PharmD

## 2017-11-27 NOTE — Unmapped (Signed)
Problem: Patient Care Overview  Goal: Plan of Care Review  Pt had an episode of SOB shortly after shower at 1600.  Pt o2 saturations >95% on room air throughout episode.  Administered PRN albuterol which improved symptoms.  Right lung fields course, left clear/diminished.  RT paged to evaluate.  HO notifed.  Otherwise uneventful shift.

## 2017-11-27 NOTE — Unmapped (Signed)
[facility]    Current Respiratory Treatments:    Albuterol HFA 90 mcg/ 2 puffs-Q4 PRN  Hypertonic Saline INH 3%-BID  QVAR Redihaler 80 mcg/ 2 puff-BID      Home Equipment:   Nebulizer Company: N/A  Teacher, early years/pre Company: N/A  Oxygen Company: N/A      ACT Devices/Methods:  For airway clearance, I reviewed with Mallory [name] the use of her OPEP device.    Reviewed ACBT video and demonstrated appropriate technique. Reviewed huff cough method and reasoning.    Today Mallory [name] was able to demonstrate technique and understood reason for proper order of therapies.      Mallory [name] will need a nebulizer machine and neb cup. I reminded her to call should something change.     Review of Cleaning Equipment:  To clean her equipment, Mallory [name] has not had to clean her equipment .  I educated her on how to properly clean/sterilize her equipment and the reason for doing so.    Spacer Teaching: N/A    Notes:

## 2017-11-28 LAB — CBC W/ AUTO DIFF
BASOPHILS ABSOLUTE COUNT: 0 10*9/L (ref 0.0–0.1)
HEMATOCRIT: 26.8 % — ABNORMAL LOW (ref 36.0–46.0)
HEMOGLOBIN: 8.3 g/dL — ABNORMAL LOW (ref 12.0–16.0)
LARGE UNSTAINED CELLS: 3 % (ref 0–4)
LYMPHOCYTES ABSOLUTE COUNT: 1.5 10*9/L (ref 1.5–5.0)
MEAN CORPUSCULAR HEMOGLOBIN CONC: 30.8 g/dL — ABNORMAL LOW (ref 31.0–37.0)
MEAN CORPUSCULAR HEMOGLOBIN: 30.3 pg (ref 26.0–34.0)
MEAN CORPUSCULAR VOLUME: 98.4 fL (ref 80.0–100.0)
MEAN PLATELET VOLUME: 7.5 fL (ref 7.0–10.0)
MONOCYTES ABSOLUTE COUNT: 0.6 10*9/L (ref 0.2–0.8)
NEUTROPHILS ABSOLUTE COUNT: 7.4 10*9/L (ref 2.0–7.5)
PLATELET COUNT: 935 10*9/L — ABNORMAL HIGH (ref 150–440)
RED BLOOD CELL COUNT: 2.73 10*12/L — ABNORMAL LOW (ref 4.00–5.20)
RED CELL DISTRIBUTION WIDTH: 15.6 % — ABNORMAL HIGH (ref 12.0–15.0)
WBC ADJUSTED: 10.3 10*9/L (ref 4.5–11.0)

## 2017-11-28 LAB — BASIC METABOLIC PANEL
ANION GAP: 11 mmol/L (ref 9–15)
BLOOD UREA NITROGEN: 7 mg/dL (ref 7–21)
BUN / CREAT RATIO: 15
CALCIUM: 8.5 mg/dL (ref 8.5–10.2)
CHLORIDE: 106 mmol/L (ref 98–107)
CO2: 22 mmol/L (ref 22.0–30.0)
CREATININE: 0.48 mg/dL — ABNORMAL LOW (ref 0.60–1.00)
EGFR MDRD NON AF AMER: >=60 mL/min/{1.73_m2} (ref >=60)
GLUCOSE RANDOM: 88 mg/dL (ref 65–179)
POTASSIUM: 3.9 mmol/L (ref 3.5–5.0)
SODIUM: 139 mmol/L (ref 135–145)

## 2017-11-28 LAB — EOSINOPHILS ABSOLUTE COUNT: Lab: 0.3

## 2017-11-28 LAB — BUN / CREAT RATIO: Urea nitrogen/Creatinine:MRto:Pt:Ser/Plas:Qn:: 15

## 2017-11-28 LAB — MAGNESIUM: Magnesium:MCnc:Pt:Ser/Plas:Qn:: 1.6

## 2017-11-28 LAB — PHOSPHORUS: Phosphate:MCnc:Pt:Ser/Plas:Qn:: 4.6

## 2017-11-28 MED ORDER — CEFEPIME 2 GRAM/100 ML IN DEXTROSE (ISO-OSMOTIC) INTRAVENOUS PIGGYBACK
Freq: Three times a day (TID) | INTRAVENOUS | 0 refills | 0.00000 days | Status: CP
Start: 2017-11-28 — End: 2018-01-06

## 2017-11-28 MED ORDER — VANCOMYCIN 1250 MG IN 250 ML IVPB
Freq: Two times a day (BID) | INTRAVENOUS | 0 refills | 0.00000 days | Status: CP
Start: 2017-11-28 — End: 2017-12-06

## 2017-11-28 NOTE — Unmapped (Signed)
Med G Daily Progress Note      Interval History: Dyspnea resolved, feeling much better, looking forward to discharge in the next day or so once home infusion set up.    Assessment/Plan:    Principal Problem:    Fever  Active Problems:    Tobacco use disorder    Bronchiectasis without complication (RAF-HCC)    History of surgical procedure    COPD (chronic obstructive pulmonary disease) (CMS-HCC)    Post-thoracotomy pain syndrome    Cough    History of Mycobacterium kansasii infection    Pneumonia of right lung due to infectious organism    Bronchopleural fistula (CMS-HCC)  Resolved Problems:    * No resolved hospital problems. *    Mallory [name] is a 61 year old woman with a PMH of COPD, bronchiectasis, and M kansasii infection s/p resection, RULectomy and superior RLL segmentectomy (2013) who presented to [facility] on 10/31/2017 with fever and cough, found to have RML consolidation, concerning for infection, although no definitive organism has been identified, now being treated with empiric broad spectrum ABx/antifungals with plans for prolonged course via home infusion.  ??  Persistent fever, in the setting of persistent RML and new RLL pneumonia  Rigid bronch 11/30 performed without fistula visualized. Continued to fever every night despite 2 weeks of IV meropenem (12/2 - 12/17). Lower respiratory cultures have grown single colonies of Aspergillus, which were not thought to be significant, and galactomannan was negative. CT chest demonstrated interval increase in patchy consolidations, GGO, and asymmetric interstitial opacities in the right lung base, as well as increase in upper RLL opacities. Started on voriconazole, but has been held in the setting of visual symptoms (12/12 - 12/17). Underwent bronch w/ BAL on 12/15 in order to continue the infectious workup, which thus far has been unrevealing (including fungal tests). CT chest 12/20 re-demonstrated right lower lobe consolidation. Rheumatology was consulted, and their index of suspicion for autoimmune disease is low. However, her ANA was positive with a speckled pattern. SPEP pattern demonstrated an irregularity in the gamma region, which may represent monoclonal protein. S/p bronch with endobronchial biopsy 12/21  - ANCA negative  - f/u immunofixation electrophoresis  - f/u UPEP  - bronch w/ endobronchial biopsy 12/21, f/u path, gram stain no organisms seen  - Started posaconazole, vancomycin, cefepime, flagyl for prolonged course per ID with plans for outpt f/u with Dr. Garner Nash to assess for clinical improvement (12/22 - )  - airway clearance w/ HTS 3% nebs, Aerobika, and chest vest  -  Will d/c PVLs given low suspicion of DVT/PE    Cachexia  Serum creatinine may be inaccurate in the setting of low BMI.  - 24 hr urine creatinine clearance normal at 118    Elevated IgE  Almost 1000. No formal history of asthma. May consider low-dose steroids.  [ ]  Aspergillus specific precipitins  - comprehensive environmental panel    Thrombocytosis  Stable in the 800s.  - daily CBC    Chronic post-thoracotomy pain syndrome  Chronic pain was consulted and provided recommendations.  - Tylenol 1000 mg q6h PRN  - oxycodone 5-10 mg q6h PRN  - tizanidine 2-4 mg TID PRN  - lidocaine patches    Iron deficiency  Iron 17, transferrin 137.6, and ferritin only 180, so likely iron deficient. Received IV iron 125 mg daily for 5 days (12/7 - 12/11).    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  COPD  -  albuterol inhaler PRN  - Flovent    Tobacco dependence  - nicotine lozenges    FEN/GI/DVT  - GI: home PPI  - DVT: Lovenox 40 mg daily  ??  Code status: DNR and DNI; confirmed with patient on admission    Patient has Advanced Directive with [name]  Healthcare POA: Mallory [name] [phone])     Dispo: Med G, floor  __________________________________________________________    Labs/Studies:  Labs and Studies from the last 24hrs per EMR and Reviewed Objective:  Temp:  [36.7 ??C-37.3 ??C] 37.1 ??C  Heart Rate:  [72-84] 75  Resp:  [18] 18  BP: (95-120)/(52-68) 106/68  SpO2:  [97 %-99 %] 97 %,     Intake/Output Summary (Last 24 hours) at 11/28/17 0747  Last data filed at 11/28/17 0600   Gross per 24 hour   Intake              500 ml   Output                0 ml   Net              500 ml      Gen: sitting up in bed, NAD  CV: normal S1, S2, RRR, no murmurs  Pulm: clear on the left, diminished on the right, breathing comfortably on RA  Abd: soft, NT, ND  Extr: WWP, no tenderness or edema of lower extremities  Neuro: A&O x3

## 2017-11-28 NOTE — Unmapped (Signed)
Problem: Pneumonia (Adult)  Goal: Signs and Symptoms of Listed Potential Problems Will be Absent, Minimized or Managed (Pneumonia)  Signs and symptoms of listed potential problems will be absent, minimized or managed by discharge/transition of care (reference Pneumonia (Adult) CPG).   Patient did well with doing all of their treatments today. Patient did their airway clearance without complications.

## 2017-11-28 NOTE — Unmapped (Signed)
GENERAL INFECTIOUS DISEASES TEAM CONSULT FOLLOW UP NOTE  ??  ??  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 In addition she has had worsening CT scan (R middle lobe consolidation and increasing RLL consolidations) while on meropenem x almost two weeks, as well positive BAL(11/30) and sputum (12/10) cultures ??for A.fumigatus.    She was started on voriconazole on 12/12 but developed visual symptoms in the setting of high levels, so it was stopped. A repeat CT chest showed stable findings overall, but some worsening of the RLL consolidation (reviewed with radiologist). She had repeat bronch w/ biopsy on 11/24/17.   ??  ID Problem List:  1. Fever/R middle and lower lobe infiltrates, with worsening of CT scan findings while on meropenem and positive BAL cxs for Aspergillus fumigatus    2. Hx of pulmonary M.kansasii infection in 2013 s/p RUL lobectomy and antimicrobial therapy with multiple negative AFB sputum cultures subsequently    3. COPD/bronchiectasis  ??  ??  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  ??  The General ID service Team A will sign off. Patient seen and discussed with Dr. Noel Gerold.   Consuello Closs  PGY-4, ID Fellow  Pager [phone]  ____________________________________     Interval events and ROS:  Continues to have low grade fevers every other day, these are accompanied by body aches. She denies any fevers yet today, as well as chills, n/v/d, CP. She did have an episode of SOB while walking back to her bed from the bathroom this morning.   ??  Medications:??  Current antimicrobials:  Meropenem (12/2-12/17) mero 12/21 -     Previous antimicrobials:  Unasyn  voro    Other medications reviewed.   ??  ??  Objective:??  Vitals:    11/27/17 1400   BP: 110/68   Pulse: 78   Resp: 18   Temp: 36.8 ??C   SpO2: 99%     ??  Physical exam   Gen: pleasant, thin appearing, no distress; alert, interactive and appropriate  HEENT: MMM, EOMI, no oral lesions or thrush  Pulm: comfortable on room air, no crackles/rhonchi  CV: rrr, no m/r/g  Abd: soft, nondistended, nontender  Ext: warm, no edema  Derm: no new lesions, no rashes or ulcers on clothed exam  Neuro: AOx3, fluent speech  ??  ??  Labs:????  ??  Lab Results   Component Value Date    WBC 8.4 11/27/2017    HGB 7.9 (L) 11/27/2017    HCT 25.4 (L) 11/27/2017    PLT 888 (H) 11/27/2017       Lab Results   Component Value Date    NA 141 11/27/2017    K 3.9 11/27/2017    CL 107 11/27/2017    CO2 24.0 11/27/2017    BUN 5 (L) 11/27/2017    CREATININE 0.45 (L) 11/27/2017    GLU 87 11/27/2017  CALCIUM 8.7 11/27/2017    MG 1.7 11/27/2017    PHOS 5.0 (H) 11/27/2017       Lab Results   Component Value Date    BILITOT 0.5 11/20/2017    BILIDIR 0.50 (H) 11/20/2017    PROT 5.7 (L) 11/22/2017    ALBUMIN 2.0 (L) 11/22/2017    ALT 28 11/20/2017    AST 28 11/20/2017    ALKPHOS 102 11/20/2017       Lab Results   Component Value Date    INR 1.26 11/02/2017    APTT 31.7 10/31/2017     ??  Galactomannan (serum) <0.5 (12/11)  Galactomannan (BAL) <0.5 (12/15)  Beta D glucan <31 (12/11)  ??  Drug monitoring:  Lab Results   Component Value Date    VANCOTROUGH 13.2 11/27/2017    VANCOTROUGH 9.4 (L) 11/10/2017    VORILVL 0.5 (L) 11/24/2017    VORILVL 4.5 11/22/2017   ??  Microbiology:   BAL cx: 12/15 (AFB, fungal: ngtd)  - legionella cx neg    BAL 12/21:   Cultures NGTD  12/21 PJP-DFA neg  12/21 RVP neg  ??  Studies:??  CXR done today, personally reviewed, unchanged from prior.

## 2017-11-28 NOTE — Unmapped (Signed)
Problem: Patient Care Overview  Goal: Plan of Care Review  Outcome: Progressing  Denies significant SOB above baseline. Alert, oriented, afebrile. IV antibiotic therapy continues without complications. Xanax given prn x1 for sleep, oxycodone 5mg  given prn x1 for pain. Proactive toileting practiced to reduce risk of falls. Medications administered as ordered per MAR. Patient has remained free from falls and injury this shift. Continuing with plan of care.  Goal: Individualization and Mutuality  Outcome: Progressing    Goal: Discharge Needs Assessment  Outcome: Progressing    Goal: Interprofessional Rounds/Family Conf  Outcome: Progressing      Problem: VTE, DVT and PE (Adult)  Goal: Signs and Symptoms of Listed Potential Problems Will be Absent, Minimized or Managed (VTE, DVT and PE)  Signs and symptoms of listed potential problems will be absent, minimized or managed by discharge/transition of care (reference VTE, DVT and PE (Adult) CPG).   Outcome: Progressing      Problem: Pain, Chronic (Adult)  Goal: Acceptable Pain/Comfort Level and Functional Ability  Patient will demonstrate the desired outcomes by discharge/transition of care.   Outcome: Progressing      Problem: Pneumonia (Adult)  Goal: Signs and Symptoms of Listed Potential Problems Will be Absent, Minimized or Managed (Pneumonia)  Signs and symptoms of listed potential problems will be absent, minimized or managed by discharge/transition of care (reference Pneumonia (Adult) CPG).   Outcome: Progressing      Problem: Fall Risk (Adult)  Goal: Identify Related Risk Factors and Signs and Symptoms  Related risk factors and signs and symptoms are identified upon initiation of Human Response Clinical Practice Guideline (CPG).   Outcome: Progressing    Goal: Absence of Fall  Patient will demonstrate the desired outcomes by discharge/transition of care.   Outcome: Progressing

## 2017-11-28 NOTE — Unmapped (Signed)
Problem: Patient Care Overview  Goal: Plan of Care Review  Outcome: Progressing  Pt VSS and afebrile this shift. Medications given as ordered. Pt c/o back pain multiple times this shift. Prn oxycodone given with effective relief. 6 minute walk completed with no noted desaturations. Pt remains on continuous pulse oximetry. Breathing treatments completed by RT. No s/sx of respiratory distress noted on room air. No new skin breakdown noted. PICC patent and intact. Pt ambulatory but remains a fall risk. Pt call appropriately for assistance to restroom. POC continued.   11/27/17 1723   OTHER   Plan of Care Reviewed With patient   Plan of Care Review   Progress improving     Goal: Individualization and Mutuality  Outcome: Progressing   11/06/17 0443   OTHER   What Anxieties, Fears, Concerns, or Questions Do You Have About Your Care? picc line   How to Address Anxieties/Fears sister will care for     Goal: Discharge Needs Assessment  Outcome: Progressing   11/01/17 1611 11/06/17 0443 11/27/17 1723   OTHER   Equipment Currently Used at Home oxygen;respiratory supplies;shower chair --  --    Patient and/or family were provided with choice of facilities / services that are available and appropriate to meet post hospital care needs? --  --  Yes   Discharge Needs Assessment   Concerns to be Addressed --  --  discharge planning   Readmission Within the Last 30 Days no previous admission in last 30 days --  --    Patient/Family Anticipates Transition to --  home;home with family;home with help/services --    Patient/Family Anticipated Services at Transition --  home health care --    Transportation Anticipated --  --  family or friend will provide   Anticipated Changes Related to Illness none --  --    Equipment Needed After Discharge none --  --    Current Discharge Risk --  --  chronically ill       Problem: VTE, DVT and PE (Adult)  Intervention: Monitor/Manage Pain   11/27/17 1723   Interventions   Medication Review/Management medications reviewed;high risk medications identified   Pain Management Interventions pain management plan reviewed with patient/caregiver;care clustered       Goal: Signs and Symptoms of Listed Potential Problems Will be Absent, Minimized or Managed (VTE, DVT and PE)  Signs and symptoms of listed potential problems will be absent, minimized or managed by discharge/transition of care (reference VTE, DVT and PE (Adult) CPG).   Outcome: Progressing   11/27/17 1723   VTE, DVT and PE (Adult)   Problems Assessed (VTE, DVT, PE) all   Problems Present (VTE, DVT, PE) none       Problem: Pain, Chronic (Adult)  Intervention: Support Psychosocial Response to Persistent Pain   11/27/17 1723   Interventions   Supportive Measures active listening utilized;verbalization of feelings encouraged;self-care encouraged   Diversional Activities television;smartphone       Goal: Acceptable Pain/Comfort Level and Functional Ability  Patient will demonstrate the desired outcomes by discharge/transition of care.   Outcome: Progressing   11/27/17 1723   Pain, Chronic (Adult)   Acceptable Pain/Comfort Level and Functional Ability making progress toward outcome       Problem: Fall Risk (Adult)  Intervention: Monitor/Assist with Self Care   11/27/17 1723   Interventions   Activity Assistance Provided assistance, 1 person   Self-Care Promotion independence encouraged;BADL personal objects within reach;BADL personal routines maintained  Goal: Identify Related Risk Factors and Signs and Symptoms  Related risk factors and signs and symptoms are identified upon initiation of Human Response Clinical Practice Guideline (CPG).   Outcome: Progressing   11/27/17 1723   Fall Risk (Adult)   Related Risk Factors (Fall Risk) homeostatic imbalance   Signs and Symptoms (Fall Risk) presence of risk factors     Goal: Absence of Fall  Patient will demonstrate the desired outcomes by discharge/transition of care.   Outcome: Progressing   11/27/17 1723   Fall Risk (Adult)   Absence of Fall making progress toward outcome

## 2017-11-29 ENCOUNTER — Encounter: Payer: Self-pay | Admitting: Primary Care

## 2017-11-29 LAB — CBC W/ AUTO DIFF
BASOPHILS ABSOLUTE COUNT: 0 10*9/L (ref 0.0–0.1)
EOSINOPHILS ABSOLUTE COUNT: 0.4 10*9/L (ref 0.0–0.4)
HEMATOCRIT: 27.5 % — ABNORMAL LOW (ref 36.0–46.0)
HEMOGLOBIN: 8.4 g/dL — ABNORMAL LOW (ref 12.0–16.0)
MEAN CORPUSCULAR HEMOGLOBIN CONC: 30.6 g/dL — ABNORMAL LOW (ref 31.0–37.0)
MEAN CORPUSCULAR HEMOGLOBIN: 30 pg (ref 26.0–34.0)
MEAN CORPUSCULAR VOLUME: 98.2 fL (ref 80.0–100.0)
MEAN PLATELET VOLUME: 7.6 fL (ref 7.0–10.0)
MONOCYTES ABSOLUTE COUNT: 0.6 10*9/L (ref 0.2–0.8)
NEUTROPHILS ABSOLUTE COUNT: 6.9 10*9/L (ref 2.0–7.5)
PLATELET COUNT: 972 10*9/L — ABNORMAL HIGH (ref 150–440)
RED BLOOD CELL COUNT: 2.8 10*12/L — ABNORMAL LOW (ref 4.00–5.20)
WBC ADJUSTED: 10 10*9/L (ref 4.5–11.0)

## 2017-11-29 LAB — BASIC METABOLIC PANEL
ANION GAP: 7 mmol/L — ABNORMAL LOW (ref 9–15)
BLOOD UREA NITROGEN: 3 mg/dL — ABNORMAL LOW (ref 7–21)
BUN / CREAT RATIO: 7
CALCIUM: 8.6 mg/dL (ref 8.5–10.2)
CHLORIDE: 109 mmol/L — ABNORMAL HIGH (ref 98–107)
CO2: 24 mmol/L (ref 22.0–30.0)
CREATININE: 0.46 mg/dL — ABNORMAL LOW (ref 0.60–1.00)
EGFR MDRD AF AMER: >=60 mL/min/{1.73_m2} (ref >=60)
EGFR MDRD NON AF AMER: >=60 mL/min/{1.73_m2} (ref >=60)
GLUCOSE RANDOM: 88 mg/dL (ref 65–179)
SODIUM: 140 mmol/L (ref 135–145)

## 2017-11-29 LAB — URINE ELECTROPHORESIS: Lab: 0

## 2017-11-29 LAB — POSACONAZOLE LEVEL: Lab: 2244

## 2017-11-29 LAB — MAGNESIUM: Magnesium:MCnc:Pt:Ser/Plas:Qn:: 1.6

## 2017-11-29 LAB — PHOSPHORUS: Phosphate:MCnc:Pt:Ser/Plas:Qn:: 3.8

## 2017-11-29 LAB — VANCOMYCIN RANDOM: Vancomycin^random:MCnc:Pt:Ser/Plas:Qn:: 15.6

## 2017-11-29 LAB — POTASSIUM: Potassium:SCnc:Pt:Ser/Plas:Qn:: 3.8

## 2017-11-29 LAB — LARGE UNSTAINED CELLS: Lab: 4

## 2017-11-29 MED ORDER — GENERIC EXTERNAL MEDICATION
Status: DC
Start: ? — End: 2017-11-29

## 2017-11-29 MED ORDER — POSACONAZOLE 100 MG PO TBEC
300.00 mg | DELAYED_RELEASE_TABLET | ORAL | Status: DC
Start: 2017-12-01 — End: 2017-11-29

## 2017-11-29 MED ORDER — LIDOCAINE 5 % EX PTCH
3.00 | MEDICATED_PATCH | CUTANEOUS | Status: DC
Start: ? — End: 2017-11-29

## 2017-11-29 MED ORDER — CEFEPIME HCL 2 GM/100ML IV SOLN
2.00 | INTRAVENOUS | Status: DC
Start: 2017-11-30 — End: 2017-11-29

## 2017-11-29 MED ORDER — METRONIDAZOLE 500 MG PO TABS
500.00 mg | ORAL_TABLET | ORAL | Status: DC
Start: 2017-11-30 — End: 2017-11-29

## 2017-11-29 MED ORDER — ENOXAPARIN SODIUM 40 MG/0.4ML ~~LOC~~ SOLN
40.00 mg | SUBCUTANEOUS | Status: DC
Start: 2017-12-01 — End: 2017-11-29

## 2017-11-29 NOTE — Unmapped (Signed)
Problem: Patient Care Overview  Goal: Plan of Care Review  Outcome: Progressing  Patient very eager to go home tomorrow. Patient and her sister had many questions about timing of discharge and home infusion. RN attempted to answer questions as best able. Patient given schedule of her IV antibiotics. RN working with pharmacy to retime Vancomycin (was at 1400, 0200). Patient ambulated in hallway multiple times this shift. No dyspnea noted.   Goal: Individualization and Mutuality  Outcome: Progressing      Problem: VTE, DVT and PE (Adult)  Goal: Signs and Symptoms of Listed Potential Problems Will be Absent, Minimized or Managed (VTE, DVT and PE)  Signs and symptoms of listed potential problems will be absent, minimized or managed by discharge/transition of care (reference VTE, DVT and PE (Adult) CPG).   Outcome: Progressing      Problem: Pain, Chronic (Adult)  Goal: Acceptable Pain/Comfort Level and Functional Ability  Patient will demonstrate the desired outcomes by discharge/transition of care.   Outcome: Progressing      Problem: Pneumonia (Adult)  Goal: Signs and Symptoms of Listed Potential Problems Will be Absent, Minimized or Managed (Pneumonia)  Signs and symptoms of listed potential problems will be absent, minimized or managed by discharge/transition of care (reference Pneumonia (Adult) CPG).   Outcome: Progressing

## 2017-11-29 NOTE — Unmapped (Signed)
Problem: Patient Care Overview  Goal: Plan of Care Review  Outcome: Progressing  Patient given oxycodone prn x1 for chronic pain and xanax prn x1 for sleep. Alert, oriented, afebrile, denies SOB. IV antibiotic therapy continues without complications. Patient anticipates discharge today 12/26 with home infusion. Medications administered as ordered per MAR. Patient has remained free from falls and injury this shift. Continuing with plan of care.  Goal: Individualization and Mutuality  Outcome: Progressing    Goal: Discharge Needs Assessment  Outcome: Progressing    Goal: Interprofessional Rounds/Family Conf  Outcome: Progressing      Problem: Pain, Chronic (Adult)  Goal: Acceptable Pain/Comfort Level and Functional Ability  Patient will demonstrate the desired outcomes by discharge/transition of care.   Outcome: Progressing      Problem: Pneumonia (Adult)  Goal: Signs and Symptoms of Listed Potential Problems Will be Absent, Minimized or Managed (Pneumonia)  Signs and symptoms of listed potential problems will be absent, minimized or managed by discharge/transition of care (reference Pneumonia (Adult) CPG).   Outcome: Progressing

## 2017-11-29 NOTE — Unmapped (Signed)
Problem: Breathing Pattern Ineffective (Adult)  Goal: Effective Oxygenation/Ventilation  Patient will demonstrate the desired outcomes by discharge/transition of care.   Patient did well with doing all of their treatments today. Patient did their airway clearance without complications.

## 2017-11-30 ENCOUNTER — Telehealth: Payer: Self-pay

## 2017-11-30 LAB — ASPERGILLUS SPECIFIC PRECIPITINS
A. FUM IGE CLASS: 0
A. FUMIGATUS MGD: POSITIVE — AB
TOTAL IGE (ASP. PRECIP): 1415 [IU]/mL — ABNORMAL HIGH

## 2017-11-30 LAB — GAMMAGLOBULIN; IGA: IgA:MCnc:Pt:Ser/Plas:Qn:: 383.6

## 2017-11-30 LAB — PHOSPHORUS: Phosphate:MCnc:Pt:Ser/Plas:Qn:: 4

## 2017-11-30 LAB — CBC W/ AUTO DIFF
BASOPHILS ABSOLUTE COUNT: 0 10*9/L (ref 0.0–0.1)
HEMATOCRIT: 25.5 % — ABNORMAL LOW (ref 36.0–46.0)
HEMOGLOBIN: 7.9 g/dL — ABNORMAL LOW (ref 12.0–16.0)
LARGE UNSTAINED CELLS: 3 % (ref 0–4)
LYMPHOCYTES ABSOLUTE COUNT: 1.2 10*9/L — ABNORMAL LOW (ref 1.5–5.0)
MEAN CORPUSCULAR HEMOGLOBIN CONC: 30.9 g/dL — ABNORMAL LOW (ref 31.0–37.0)
MEAN CORPUSCULAR HEMOGLOBIN: 30.7 pg (ref 26.0–34.0)
MEAN CORPUSCULAR VOLUME: 99.3 fL (ref 80.0–100.0)
MEAN PLATELET VOLUME: 7.6 fL (ref 7.0–10.0)
NEUTROPHILS ABSOLUTE COUNT: 6.4 10*9/L (ref 2.0–7.5)
RED BLOOD CELL COUNT: 2.57 10*12/L — ABNORMAL LOW (ref 4.00–5.20)
RED CELL DISTRIBUTION WIDTH: 15.8 % — ABNORMAL HIGH (ref 12.0–15.0)
WBC ADJUSTED: 8.8 10*9/L (ref 4.5–11.0)

## 2017-11-30 LAB — A. FUMIGATUS IGG: Lab: 104 — ABNORMAL HIGH

## 2017-11-30 LAB — BASIC METABOLIC PANEL
ANION GAP: 10 mmol/L (ref 9–15)
BLOOD UREA NITROGEN: 3 mg/dL — ABNORMAL LOW (ref 7–21)
BUN / CREAT RATIO: 7
CALCIUM: 8.3 mg/dL — ABNORMAL LOW (ref 8.5–10.2)
CHLORIDE: 107 mmol/L (ref 98–107)
CO2: 23 mmol/L (ref 22.0–30.0)
CREATININE: 0.42 mg/dL — ABNORMAL LOW (ref 0.60–1.00)
EGFR MDRD AF AMER: >=60 mL/min/{1.73_m2} (ref >=60)
EGFR MDRD NON AF AMER: >=60 mL/min/{1.73_m2} (ref >=60)
GLUCOSE RANDOM: 94 mg/dL (ref 65–179)
POTASSIUM: 3 mmol/L — ABNORMAL LOW (ref 3.5–5.0)
SODIUM: 140 mmol/L (ref 135–145)

## 2017-11-30 LAB — CREATININE: Creatinine:MCnc:Pt:Ser/Plas:Qn:: 0.42 — ABNORMAL LOW

## 2017-11-30 LAB — GAMMAGLOBULIN; IGM: IgM:MCnc:Pt:Ser/Plas:Qn:: 233

## 2017-11-30 LAB — MAGNESIUM: Magnesium:MCnc:Pt:Ser/Plas:Qn:: 1.6

## 2017-11-30 LAB — RED CELL DISTRIBUTION WIDTH: Lab: 15.8 — ABNORMAL HIGH

## 2017-11-30 LAB — GAMMAGLOBULIN; IGG: IgG:MCnc:Pt:Ser/Plas:Qn:: 973

## 2017-11-30 MED ORDER — OXYCODONE 5 MG TABLET
ORAL_TABLET | 0 refills | 0 days | Status: CP
Start: 2017-11-30 — End: ?

## 2017-11-30 MED ORDER — LIDOCAINE 5 % TOPICAL PATCH
MEDICATED_PATCH | Freq: Every day | TRANSDERMAL | 1 refills | 0.00000 days | Status: CP | PRN
Start: 2017-11-30 — End: 2017-12-30

## 2017-11-30 MED ORDER — POSACONAZOLE 100 MG TABLET,DELAYED RELEASE
ORAL_TABLET | Freq: Every day | ORAL | 0 refills | 0.00000 days | Status: CP
Start: 2017-11-30 — End: 2018-01-06

## 2017-11-30 MED ORDER — METRONIDAZOLE 500 MG TABLET
ORAL_TABLET | Freq: Three times a day (TID) | ORAL | 0 refills | 0.00000 days | Status: CP
Start: 2017-11-30 — End: 2018-01-06

## 2017-11-30 MED ORDER — POTASSIUM CHLORIDE CRYS ER 10 MEQ PO TBCR
40.00 | EXTENDED_RELEASE_TABLET | ORAL | Status: DC
Start: 2017-11-30 — End: 2017-11-30

## 2017-11-30 MED ORDER — GENERIC EXTERNAL MEDICATION
Status: DC
Start: ? — End: 2017-11-30

## 2017-11-30 MED FILL — VENTOLIN HFA 90 MCG INHALER: 108 (90 BAS | 16 days supply | Qty: 18 | Fill #3

## 2017-11-30 MED FILL — LIDOCAINE PATCH 5%: 5 | 10 days supply | Qty: 30 | Fill #0

## 2017-11-30 MED FILL — SODIUM CHLORIDE 3% VIAL: 3 | 30 days supply | Qty: 240 | Fill #1

## 2017-11-30 MED FILL — PANTOPRAZOLE SOD DR 40 MG T: 40 | 90 days supply | Qty: 180 | Fill #1

## 2017-11-30 MED FILL — QVAR REDIHALER 80 MCG/ACT A: 80 | 30 days supply | Qty: 11 | Fill #4

## 2017-11-30 NOTE — Telephone Encounter (Signed)
[name] [name] from [facility] called to see if Posaconizole level could be drawn at [facility] lab. Terri in lab said out lab does not have test listed but [facility] has test listed. Spoke with Gentry Fitz NP and she advised pulmonologist to give order to pt to go to [facility] drawing station and results will be sent to ordering physician. [name] [name] voiced understanding.

## 2017-11-30 NOTE — Unmapped (Signed)
Problem: Patient Care Overview  Goal: Plan of Care Review  Outcome: Progressing  No significant events. Pt A/OX4.  Pt on RA.  Ambulated unit.  VSS. On room air.  Afebrile throughout shift.  Tolerated all meds per MAR.  No falls or injury.  Continue POC.

## 2017-11-30 NOTE — Unmapped (Signed)
Physician Discharge Summary    Identifying Information:   Mallory Mallory [name]  September 11, 1956  [id number]    Admit date: 10/31/2017    Discharge date: 11/30/2017     Discharge Service: Pulmonology (MDG)    Discharge Attending Physician: Dr. Wyline Copas    Discharge to: Home with Home Health Mallory Mallory [name] PT/OT    Discharge Diagnoses:  Principal Problem:    Fever  Active Problems:    Tobacco use disorder    Mallory Mallory [name] without complication (RAF-HCC)    History of surgical procedure    Mallory [name] (chronic obstructive pulmonary disease) (CMS-HCC)    Post-thoracotomy pain syndrome    Cough    History of Mycobacterium kansasii infection    Pneumonia of right lung due to infectious organism    Bronchopleural fistula (CMS-HCC)  Resolved Problems:    * No resolved hospital problems. *      Outpatient Provider Follow Up Issues:   - 6 week course of broad spectrum antibiotics including vancomycin, cefepime, flagyl, Mallory Mallory [name] (12/22-2/2) with plans to f/u with Dr. Garner Nash 1/22 for further management.    Hospital Course:     Mallory Perryis a 61 year old woman??with a PMH of Mallory [name], Mallory Mallory [name], Mallory Mallory [name], Mallory lobectomy Mallory superior RLL??segmentectomy (2013) who presented to [facility] on 10/31/2017 with fever??Mallory cough, found to have RML consolidation, concerning for pneumonia. Despite extensive infectious workup (including bronchoscopy with BAL x2 Mallory transbronchial biopsy), no definitive organism was identified, Mallory the decision was made to empirically treat with a 6-week course of broad-spectrum antibiotics Mallory antifungals    Persistent fever, in the setting of persistent RML Mallory new RLL pneumonia  Received multiple rounds of PO antibiotics for presumed pneumonia with overall worsening symptoms 2 weeks prior to admission. CT Chest showed complete RML atelectasis Mallory occluded RML airways compared to CT in September. 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 Rigid bronchoscopy 11/30 performed without fistula visualized. Initially started on IV Unasyn (11/27 - 12/2), then switched to IV meropenem (12/2 - 12/17). Continued to fever every day despite being on IV antibiotics. Also received IV vancomycin for several doses in the setting of GPCs on Gram stain, but stopped after MRSA swab came back negative. Lower respiratory cultures have grown single colonies of Aspergillus, so she was started on voriconazole 200 mg BID for presumed fungal infection. CT chest on 12/13 demonstrated interval increase in patchy consolidations, GGO, Mallory asymmetric interstitial opacities in the right lung base, as well as increase in upper RLL opacities. Underwent bronch w/ BAL on 12/15, but infectious workup has continued to be unrevealing. All fungal cultures Mallory tests have been negative so far. Voriconazole was reduced to 100 mg BID, then held in the setting of visual symptoms. Rheumatology was consulted, Mallory their index of suspicion for autoimmune disease is low. However, her ANA was positive with a speckled pattern. SPEP pattern demonstrated an irregularity in the gamma region, which may represent monoclonal protein Underwent a third bronch with endobronchial biopsy on 12/21, pathology notable for fibrous stroma with focal lymphohistiocytic infiltrate, possible loose granuloma.  GMS Mallory AFB stains were negative for fungi or AFB. Total IgG was normal.  ID was consulted Mallory the decision was made to start a 6-week course of broad-spectrum antibiotic Mallory antifungal coverage with Mallory [name], vancomycin, cefepime, Mallory Flagyl (12/22 - 2/2).  She will follow-up with Dr. Garner Nash  on 1/22. Airway clearance with HTS 3% nebs, Aerobika, Mallory chest vest (which was delivered in house, Mallory patient underwent teaching).  ??  Elevated IgE: Almost 1000. No formal history of asthma. Aspergillus specific precipitins resulted on day of discharge, notable for elevated total IgE Mallory Aspergillus IgG, however, Aspergillus IgE negative.  Comprehensive environmental panel pending at time of discharge.  Attention on follow-up.  ??  Thrombocytosis  PLT elevated to the 900s Mallory remained stable. Hematopathology review of smear was unrevealing except for the number of platelets. Likely due to marked inflammatory response.  ??  Chronic post-thoracotomy pain syndrome  Chronic pain was consulted Mallory recommended Tylenol 1000 mg q6h PRN, oxycodone 5-10 mg q6h PRN, tizanidine 2-4 mg TID PRN, lidocaine patches. She was provided oxycodone #20 Mallory lidocaine patches at discharge, Mallory will follow up with her PCP for further adjustments in her home chronic pain regimen.  ??  Iron deficiency  Iron 17, transferrin 137.6, Mallory ferritin only 180, so likely iron deficient. Received IV iron 125 mg daily for 5 days (12/7 - 12/11).  ??  History of M kansasii pulmonary cavitary lesion  Sputum AFB smears have been negative.    Procedures:  No admission procedures for hospital encounter.  ______________________________________________________________________    Discharge Day Services:  BP 103/66  - Pulse 80  - Temp 37.5 ??C (Oral)  - Resp 18  - Ht 167.6 cm (5' 6)  - Wt 46.8 kg (103 lb 3.2 oz)  - SpO2 96%  - BMI 16.66 kg/m??   Pt seen on the day of discharge Mallory determined appropriate for discharge.    Condition at Discharge: good  ______________________________________________________________________  Discharge Medications:     Your Medication List      START taking these medications    cefepime IVPB  Commonly known as:  MAXIPIME  Infuse 100 mL (2 g total) into a venous catheter every eight (8) hours.     lidocaine 5 % patch  Commonly known as:  LIDODERM  Place 3 patches on the skin daily as needed (msk pain). Apply to affected area for 12 hours only each day (then remove patch)     metroNIDAZOLE 500 MG tablet  Commonly known as:  FLAGYL  Take 1 tablet (500 mg total) by mouth Three (3) times a day.     oxyCODONE 5 MG immediate release tablet  Commonly known as:  ROXICODONE  Take 1-2 tablets every 6 hours as needed for pain     Mallory [name] 100 mg Tbec delayed released tablet  Commonly known as:  NOXAFIL  Take 300 mg by mouth daily.     vancomycin 1,250 mg, OVERFILL 25 mL in sodium chloride 0.9 % 250 mL IVPB  Infuse 1,250 mg into a venous catheter every twelve (12) hours.        CONTINUE taking these medications    acetaminophen 500 MG tablet  Commonly known as:  TYLENOL  Take 500 mg by mouth every four (4) hours as needed. Moderate pain or fever     ALPRAZolam 0.5 MG tablet  Commonly known as:  XANAX  Take 0.5 mg by mouth nightly as needed. Take 0.25 to 0.5 mg qhs PRN     aspirin 81 MG chewable tablet  Chew 81 mg daily.     benzonatate 100 MG capsule  Commonly known as:  TESSALON  Take 100-200 mg by mouth.     HYDROcodone-acetaminophen 7.5-325 mg per tablet  Commonly known as:  NORCO  Take 1  tablet by mouth every six (6) hours as needed.     lysine 1,000 mg Tab  Take by mouth.     magnesium oxide 400 mg (241.3 mg magnesium) tablet  Commonly known as:  MAG-OX  Take 400 mg by mouth daily.     methocarbamol 500 MG tablet  Commonly known as:  ROBAXIN  Take 500 mg by mouth Three (3) times a day as needed.     NICORETTE 4 MG lozenge  Generic drug:  nicotine polacrilex  Apply 4 mg to cheek.     ondansetron 4 MG tablet  Commonly known as:  ZOFRAN  Take 4 mg by mouth every eight (8) hours as needed for nausea.     pantoprazole 40 MG tablet  Commonly known as:  PROTONIX  Take 40 mg by mouth Two (2) times a day.     potassium gluconate 595 mg (99 mg) Tab  Take 1 capsule by mouth daily.     QVAR REDIHALER 80 mcg/actuation inhaler  Generic drug:  beclomethasone dipropionate  Inhale 2 puffs.     sodium chloride 3 % nebulizer solution  Inhale 4 mL by nebulization Two (2) times a day. Inhale 4 ml twice daily     VENTOLIN HFA 90 mcg/actuation inhaler  Generic drug: albuterol  INHALE 2 PUFFS EVERY 4 HOURS AS NEEDED FOR WHEEZING OR SHORTNESS OF BREATH.     VITAMIN D2 ORAL  Take 1 tablet by mouth daily with evening meal.          ______________________________________________________________________  Pending Test Results (if blank, then none):   Order Current Status    Comprehensive Environmental Panel In process    IgG 1, 2, 3, Mallory 4 In process    AFB culture Preliminary result    AFB culture Preliminary result    AFB culture Preliminary result    AFB culture Preliminary result    AFB culture Preliminary result    AFB culture Preliminary result    Fungal Culture Preliminary result    Fungal Culture Preliminary result    Fungal Culture Preliminary result    Legionella Culture Preliminary result          Most Recent Labs:  Microbiology Results (last day)     Procedure Component Value Date/Time Date/Time    Blood Culture, Adult [[id number]]  (Normal) Collected:  11/25/17 0043    Lab Status:  Final result Specimen:  Blood from Peripheral Updated:  11/30/17 0145     Blood Culture, Routine No Growth at 5 days    Blood Culture, Adult [[id number]]  (Normal) Collected:  11/25/17 0046    Lab Status:  Final result Specimen:  Blood from PICC Line Updated:  11/30/17 0145     Blood Culture, Routine No Growth at 5 days    Aspergillus Galactomannan AG, BAL [[id number]]  (Normal) Collected:  11/24/17 1615    Lab Status:  Final result Specimen:  Lavage, Bronchial from Lung, Right Lower Lobe Updated:  11/29/17 1610     Aspergillus AG, BAL Specimen <0.5    Narrative:         This is a qualitative test Mallory the result index value should not be used to determine disease severity or monitor response to therapy.    AFB culture [[id number]]  (Normal) Collected:  11/24/17 1615    Lab Status:  Preliminary result Specimen:  Lavage, Bronchial from Lung, Right Lower Lobe Updated:  11/29/17 1600     AFB Culture Culture in Progress  Narrative:       Specimen Source: Lung, Right Lower Lobe    AFB culture [[id number]]  (Normal) Collected:  11/18/17 0957    Lab Status:  Preliminary result Specimen:  Lavage, Bronchial from Lung, Right Lower Lobe Updated:  11/29/17 1548     AFB Culture NEGATIVE TO DATE    Narrative:       Specimen Source: Lung, Right Lower Lobe    AFB culture [[id number]]  (Normal) Collected:  11/03/17 1152    Lab Status:  Preliminary result Specimen:  Washing, Bronchial from Lung, Combined Updated:  11/29/17 1519     AFB Culture NEGATIVE TO DATE    Narrative:       Specimen Source: Lung, Combined    AFB culture [[id number]]  (Normal) Collected:  11/01/17 0531    Lab Status:  Preliminary result Specimen:  Sputum from SPUTUM EXPECTORATED Updated:  11/29/17 1519     AFB Culture NEGATIVE TO DATE    Narrative:       Specimen Source: SPUTUM EXPECTORATED    AFB culture [[id number]]  (Normal) Collected:  11/01/17 1518    Lab Status:  Preliminary result Specimen:  Sputum from SPUTUM INDUCED Updated:  11/29/17 1519     AFB Culture NEGATIVE TO DATE    Narrative:       Specimen Source: SPUTUM INDUCED    AFB culture [[id number]]  (Normal) Collected:  10/31/17 2038    Lab Status:  Preliminary result Specimen:  Sputum from SPUTUM EXPECTORATED Updated:  11/29/17 1519     AFB Culture NEGATIVE TO DATE    Narrative:       Specimen Source: SPUTUM EXPECTORATED          Lab Results   Component Value Date    WBC 8.8 11/30/2017    HGB 7.9 (L) 11/30/2017    HCT 25.5 (L) 11/30/2017    PLT 920 (H) 11/30/2017       Lab Results   Component Value Date    NA 140 11/30/2017    K 3.0 (L) 11/30/2017    CL 107 11/30/2017    CO2 23.0 11/30/2017    BUN 3 (L) 11/30/2017    CREATININE 0.42 (L) 11/30/2017    CALCIUM 8.3 (L) 11/30/2017    MG 1.6 11/30/2017    PHOS 4.0 11/30/2017       Lab Results   Component Value Date    ALKPHOS 102 11/20/2017    BILITOT 0.5 11/20/2017    BILIDIR 0.50 (H) 11/20/2017    PROT 5.7 (L) 11/22/2017    ALBUMIN 2.0 (L) 11/22/2017    ALT 28 11/20/2017    AST 28 11/20/2017       Lab Results   Component Value Date PT 14.4 (H) 11/02/2017    INR 1.26 11/02/2017    APTT 31.7 10/31/2017     Hospital Radiology:  Xr Chest Portable    Result Date: 11/24/2017  EXAM: XR CHEST PORTABLE DATE: 11/24/2017 4:34 PM ACCESSION: [id number] UN DICTATED: 11/24/2017 4:45 PM [facility]PRETATION LOCATION: [facility] CLINICAL INDICATION: 61 years old Female with POSTSURGICAL STATUS-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 Please check for pneumothorax-  COMPARISON: 11/12/2017 TECHNIQUE: Portable Chest Radiograph. CONCLUSIONS: Status post transbronchial biopsy. No pneumothorax or sizable pneumothorax identified. Left PICC terminates in the right atrium. Left apical opacity with cavitation is grossly unchanged from previous study.    Xr Chest Portable    Result Date: 11/12/2017  EXAM: XR CHEST PORTABLE DATE: 11/12/2017 6:07 PM ACCESSION: [id number] UN DICTATED: 11/12/2017 6:29  PM [facility]PRETATION LOCATION: [facility] CLINICAL INDICATION: 61 years old Female with OTHER-infection in pleural space. Known cystic cavity (possible bronchopleural fistula)--  COMPARISON: 11/05/2017 Mallory earlier TECHNIQUE: Portable Chest Radiograph. FINDINGS: Unchanged positioning of the left-sided PICC line. Sequelae of right upper lobe lobectomy. Unchanged cystic right apical opacity. Similar appearing opacity along the inferior margin. No new airspace opacities. No pleural effusion or pneumothorax Stable cardiomediastinal silhouette.      No interval change.    Xr Chest Portable    Result Date: 11/03/2017  EXAM: CHEST ONE VIEW DATE: 11/03/2017 8:52 AM ACCESSION: [id number] UN DICTATED: 11/03/2017 9:57 AM [facility]PRETATION [facility]: [facility] CLINICAL INDICATION: 61 years old Female with -r/p rib injury, PTX-  COMPARISON: Chest radiograph dated 08/06/2017 Mallory CT chest dated 1127 8 TECHNIQUE: AP semi upright view of the chest. FINDINGS: Left PICC with tip terminating in the mid SVC. Sequela of right upper lobectomy. Unchanged cystic right apical opacity with adjacent patchy opacities along the inferior margin. Cardiomediastinal silhouette is normal. No pleural effusion.      -Unchanged right apical cystic opacity which may represent a chronic bronchopleural fistula. -Patchy consolidations along the inferior margin of the cystic opacity are unchanged.      Ct Chest Wo Contrast    Result Date: 11/24/2017  EXAM: CT CHEST WO CONTRAST DATE: 11/23/2017 10:19 PM ACCESSION: [id number] UN DICTATED: 11/24/2017 2:44 AM [facility]PRETATION [facility]: [facility] CLINICAL INDICATION: 61 years old Female with -f/u of chronic pneumonia-  COMPARISON: The chest dated 11/16/2017 TECHNIQUE: A spiral CT scan was obtained without IV contrast from the thoracic inlet through the hemidiaphragms. Images were reconstructed in the axial plane.  Coronal Mallory sagittal reformatted images of the chest were also provided for further evaluation of the lung parenchyma. FINDINGS: AIRWAYS, LUNGS, PLEURA: Patient is status post right upper lobectomy. Similar appearance of a right apical cystic lesion. Right lower lobe airspace consolidation new from prior. Left lung is clear. MEDIASTINUM: Normal heart size. No pericardial effusion. Normal caliber thoracic aorta. Aortic Mallory coronary artery calcifications. Stable right paratracheal lymphadenopathy. IMAGED ABDOMEN: Unremarkable. SOFT TISSUES: Unremarkable. BONES: Unchanged T10 vertebral body hemangioma hemangioma Mallory chronic changes in the right lateral second Mallory sixth ribs.      - New right lower lobe airspace consolidation, concerning for infection. - Cystic lesion in the right superior hemithorax may be secondary to bronchopleural fistula      Ct Chest Wo Contrast    Result Date: 11/16/2017  EXAM: CT CHEST WO CONTRAST DATE: 11/16/2017 4:03 PM ACCESSION: [id number] UN DICTATED: 11/16/2017 4:08 PM [facility]PRETATION [facility]ATION: [facility] CLINICAL INDICATION: 61 years old Female with Other-follow-up chronic pneumonia-  COMPARISON: Chest CT 11/05/2017 TECHNIQUE: A spiral CT scan was obtained without IV contrast from the thoracic inlet through the hemidiaphragms. Images were reconstructed in the axial plane.  Coronal Mallory sagittal reformatted images of the chest were also provided for further evaluation of the lung parenchyma. FINDINGS: Left PICC terminates in right atrium. AIRWAYS, LUNGS, PLEURA: Emphysematous changes. Post right upper lobectomy. Right apical thick-walled cystic lesion similar to prior. Slight interval worsening in consolidation of the superior aspect of the right lower lobe. Short interval increase in patchy airspace opacities with adjacent groundglass Mallory asymmetric intralobular septal thickening in the base of the right lower lobe. New foci of endobronchial opacification in right lower lobe. MEDIASTINUM: Normal heart size. No pericardial effusion. Normal caliber thoracic aorta. Aortic Mallory coronary artery calcifications. Right paratracheal lymphadenopathy, with increase in right superior paratracheal region, up to 1.1 cm, previously subcentimeter, (2:31. IMAGED ABDOMEN:  Unremarkable. SOFT TISSUES: Unremarkable. BONES: Unchanged chronic changes in the right lateral second Mallory sixth ribs. 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      --Short interval increase in patchy consolidations, groundglass opacities, Mallory asymmetric interstitial opacities in the right lung base could reflect worsening multifocal pneumonia, superimposed acute aspiration (particularly given new RLL endobronchial opacification), Mallory Mallory [name] asymmetric edema. 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 Attention on follow-up. --Stable to slight increase in consolidative opacities in the upper portion of the right lower lobe.    Ct Chest Wo Contrast    Result Date: 11/06/2017  EXAM: CT CHEST WO CONTRAST DATE: 11/05/2017 6:46 PM ACCESSION: [id number] UN DICTATED: 11/05/2017 7:06 PM [facility]PRE[facility]: [facility] CLINICAL INDICATION: 24 years year old female with infection in pleural space. Known cystic cavity (possible bronchopleural fistula)-  COMPARISON: CT of the chest dated 10/31/2017. TECHNIQUE: A spiral CT scan was obtained with IV contrast from the thoracic inlet through the hemidiaphragms. Images were reconstructed in the axial plane.  Coronal Mallory sagittal reformatted images of the chest were also provided for further evaluation of the lung parenchyma. FINDINGS: AIRWAYS, LUNGS, PLEURA: Emphysema. Right upper lobectomy Mallory unchanged cystic right apical opacity unchanged. Progressed consolidation of the upper portion of the right lower lobe (series 5, Image 44). Increased right lower lobe patchy/tree-in-bud Mallory groundglass opacities associated with bronchial thickening. Complete opacification of the right middle lobe with volume loss again noted Mallory right middle lobe airway occlusion. No pleural effusion. MEDIASTINUM: Normal heart size. No pericardial effusion. Normal caliber thoracic aorta.  No mediastinal lymphadenopathy. IMAGED ABDOMEN: Unremarkable. SOFT TISSUES: Unremarkable. BONES: Unremarkable.       Increased consolidation in the upper portion of the right lower lobe Mallory new patchy/tree-in-bud opacities compatible with infection. Unchanged complete right middle lobe opacification compatible with atelectasis/consolidation Mallory occluded right middle lobe airways. Large cystic right apical opacity may be secondary to a chronic bronchopleural fistula, stable.      Ct Chest Wo Contrast    Result Date: 10/31/2017  EXAM: CT CHEST WO CONTRAST DATE: 10/31/2017 2:15 PM ACCESSION: [id number] UN DICTATED: 10/31/2017 2:24 PM [facility] CLINICAL INDICATION: 61 years old Female with Other--  COMPARISON: 08/09/2017. TECHNIQUE: A spiral CT scan was obtained without IV contrast from the thoracic inlet through the hemidiaphragms. Images were reconstructed in the axial plane.  Coronal Mallory sagittal reformatted images of the chest were also provided for further evaluation of the lung parenchyma. FINDINGS: AIRWAYS, LUNGS, PLEURA: Emphysema. Right upper lobectomy. Cystic right apical opacity unchanged. Patchy consolidations along the inferior margin of the cystic opacities similar to the prior exam. Interval development of complete opacification of the previously aerated right middle lobe with volume loss. The proximal right middle lobe airways are no longer visualized Mallory are likely occluded. Improved consolidations at the medial right base. No pleural effusion. MEDIASTINUM: Normal heart size. No pericardial effusion. Normal caliber thoracic aorta.  No mediastinal lymphadenopathy. IMAGED ABDOMEN: Unremarkable. SOFT TISSUES: Unremarkable. BONES: Unremarkable.      Interval development of complete right middle lobe atelectasis Mallory occluded right middle lobe airways. Large cystic right apical opacity may be secondary to a chronic bronchopleural fistula. Patchy consolidations lung inferior margin of the cystic opacity without significant change.    Xr Chest 2 Views    Result Date: 11/27/2017  EXAM: CHEST TWO VIEW DATE: 11/27/2017 8:37 AM ACCESSION: [id number] UN DICTATED: 11/27/2017 8:59 AM [facility]PRETATION [facility]: [facility] CLINICAL INDICATION: 60 years old Female with DYSPNEA--  COMPARISON: Chest Radiograph:  11/24/2017 Mallory prior chest radiographs. Chest CT 11/23/2017. TECHNIQUE: Upright PA Mallory Lateral views of the chest. FINDINGS: Left PICC unchanged. Sequelae of right upper lobectomy. Cystic right apical opacity is grossly unchanged from prior examinations. Patchy right lower lobe parenchymal opacities as seen on recent CT, concerning for infection. Trace right effusion. No pneumothorax. Cardiac silhouette unchanged.      ______________________________________________________________________    Discharge Instructions:   Activity Instructions     Activity as tolerated                     Follow Up instructions Mallory Outpatient Referrals     Referral to Home Infusion Performing location?:  External    Home Health Requested Disciplines:   Nursing  Physical Therapy  Occupational Therapy       **Please contact your service pharmacist for assistance with discharge home health infusion monitoring. Please see link above for Laboratory Monitoring Guidelines.     Home Health/Home Infusion Orders:       HH Skilled Nurse to teach patient/caregiver the administration of home infusion therapy as ordered.   Please teach/review the following, as applicable: how to flush Mallory maintain IV line, medication bag change, storage/disposal of medication/supplies, Mallory inventory control.  Monitor for signs Mallory symptoms of infection, phlebitis, dislodgement/catheter migration, Mallory occlusion of IV line. Instruct to keep IV site clean, dry Mallory intact.   Review medication side effects.   Teach how to troubleshoot device/IV line function Mallory who to call if dressing gets dirty, wet, or lifts off.   Review Mallory provide the patient/caregiver with contact information regarding who to contact within your agency for questions, concerns or issues.    CVAD Dressing Change:  Please change dressing weekly/prn using sterile CVAD kit/transparent dressing.   Please change Biopatch with CVAD dressing change.   Change the stabilization device (STATLOCK) as applicable with each dressing change.  Please change needleless cap at least weekly with dressing change Mallory prn, Mallory after every blood draw, using aseptic technique.    Note - Generic substitutions permissible by Home Infusion agencies.    IV Access:  Line Type: PICC                Lumens: Single  Site: L basillic  Placement Date: 11/02/17    Special Instructions:     Flush Orders:  PICC Line Adult Patient PICC Line Adult Patient:   Flush with 5 mLs of Normal Saline before Mallory after each use Mallory prn for line maintenance. Use after blood draws. Flush with Heparin 100units/mL 2 mLs at the end of each use Mallory prn for line maintenance. Please change needleless cap weekly Mallory after blood draws, or prn using aseptic technique. Please flush unused lumen(s) with Heparin 100units/mL 2 mLs every day.    LABS:    Follow-up Labs:   Please draw the following labs: Please draw vancomycin trough on Friday, 12/28. Please draw the following weekly labs: Weekly CBC with differential, BUN, serum Cr, vancomycin trough  Name to call/fax results to:   Name: Alton Revere, PharmD, BCPS  Phone: [phone]  Fax: [phone]     Physician to Follow Patient's Care (the person listed here will be responsible for signing ongoing orders):   Name: Dr. Rolm Baptise  Phone: [phone]  Fax: [phone]       I certify that Zariya Minner is confined to his/her home Mallory needs intermittent skilled nursing care, physical therapy Mallory Mallory [name] speech therapy or continues to need occupational  therapy. The patient is under my care, Mallory I have authorized services on this plan of care Mallory will periodically review the plan. The patient had a face-to-face encounter with an allowed provider type on (date) 11/28/2017 Mallory the encounter was related to the primary reason for home health care.         Ambulatory referral to Home Health       Performing location?:  External    Disciplines requested:   Nursing  Physical Therapy  Occupational Therapy       Requested start of care date:  Day after Discharge    Special instructions:  or first available    I certify that Laurelin Elson is confined to his/her home Mallory needs intermittent skilled nursing care, physical therapy Mallory Mallory [name] speech therapy or continues to need occupational therapy. The patient is under my care, Mallory I have authorized services on this plan of care Mallory will periodically review the plan. The patient had a face-to-face encounter with an allowed provider type on (date) 11/26/2017 Mallory the encounter was related to the primary reason for home health care.         [facility]  [address] [place] [address] [phone]     [facility]: day after discharge or first available    Our rep is Alphonso: [phone]      [facility]: Evaluate Mallory Treat, Medication Management Mallory Teaching    HHOT: Evaluate Mallory Treat, ADL retraining    HHPT: Evaluate Mallory Treat, Home Strengthening Program, Home Safety Evaluation         Call MD for:  difficulty breathing, headache or visual disturbances       Call MD for:  persistent dizziness or light-headedness       Call MD for:  persistent nausea or vomiting       Call MD for:  severe uncontrolled pain       Call MD for:  temperature >38.5 Celsius       Discharge instructions       You were hospitalized at [facility] for persistent fevers due to worsening pneumonia in your right lung. You received multiple antibiotic courses as well as bronchoscopies as well as a biopsy of your lung to determine the cause of your infection, all without definitive results. You will be treated empirically with broad antibiotics to treat possible bacterial Mallory fungal infections that could be causing your infection. You will complete a 6 week course of Flagyl, Mallory [name], vancomycin, Mallory cefepime. We have called these medications into your home pharmacy, as well as set up home infusion. It is very important that you do not drink alcohol while taking these medications, as it can make you sick. You have an appointment scheduled with Dr. Garner Nash for follow up of your infection, Mallory to determine duration of your antibiotic course if it is necessary beyond 6 weeks. It was a pleasure taking care of you!         Referral to Home Infusion       Performing location?:  External    Home Health Requested Disciplines:   Nursing  Physical Therapy  Occupational Therapy       **Please contact your service pharmacist for assistance with discharge home health infusion monitoring. Please see link above for Laboratory Monitoring Guidelines.     Home Health/Home Infusion Orders:     Agency Details:   Infusion Agency:  [facility]     [facility] [phone]  (f)  [phone]    Infusion Start of Care Date: 11/30/17    Nursing Agency: [facility] Start of Care Date:11/30/17    [facility] Skilled Nurse to teach patient/caregiver the administration of home infusion therapy as ordered.   Please teach/review the following, as applicable: how to flush Mallory maintain IV line, medication bag change, storage/disposal of medication/supplies, Mallory inventory control.  Monitor for signs Mallory symptoms of infection, phlebitis, dislodgement/catheter migration, Mallory occlusion of IV line. Instruct to keep IV site clean, dry Mallory intact.   Review medication side effects.   Teach how to troubleshoot device/IV line function Mallory who to call if dressing gets dirty, wet, or lifts off.   Review Mallory provide the patient/caregiver with contact information regarding who to contact within your agency for questions, concerns or issues.    CVAD Dressing Change:  Please change dressing weekly/prn using sterile CVAD kit/transparent dressing.   Please change Biopatch with CVAD dressing change.   Change the stabilization device (STATLOCK) as applicable with each dressing change.  Please change needleless cap at least weekly with dressing change Mallory prn, Mallory after every blood draw, using aseptic technique.    Note - Generic substitutions permissible by Home Infusion agencies.    IV Access:  Line Type: SL Picc                Diameter/Gauge: 4 Fr.   Length Total:41 cm.   Site: L basilic (Example: Basilic or IJ Vein, etc.)  Arm Circumference: 24.5 cm  (PICC line only)  Placement Date: 11/02/17    Special Instructions:     Flush Orders:  PICC Line Adult Patient PICC Line Adult Patient:   Flush with 5 mLs of Normal Saline before Mallory after each use Mallory prn for line maintenance. Use after blood draws. Flush with Heparin 100units/mL 2 mLs at the end of each use Mallory prn for line maintenance. Please change needleless cap weekly Mallory after blood draws, or prn using aseptic technique. Please flush unused lumen(s) with Heparin 100units/mL 2 mLs every day.    LABS:    Pharmacy Note: Transition to Outpatient with Home Infusion  Gray Maugeri 61 y.o.  female  ??  Admission: 10/31/2017  ??  ??  ??  ------ Home Infusion Medication Mallory Monitoring -----  ??   Disciplines requested: Nursing Mallory Home IV Antibiotics  ??  Physician to follow patient's care (the person listed here will be responsible for signing ongoing orders): M. Rolm Baptise  ??  Start of Care Date: 11/30/17  Estimate completion of therapy: Feb 2nd 2019  ??  IV ANTIBIOTIC ORDERS:  Cefepime 2g IV every 8 hours   Vancomycin 1.25g q12h     Metronidazole 500mg  q8h (oral, local pharmacy)  Mallory [name] 300mg  daily (oral, local pharmacy)  ??  **DO NOT DRAW DRUG LEVELS FROM PORT**  ??  LAB MONITORING ORDERS:  ??  Start labs on 12/04/17  ??  Mon/Tue preferred for ONCE weekly labs  Mon Mallory Thu preferred for TWICE weekly  ??  (Vancomycin): ONCE weekly:Vancomycin trough (drawn prior to start of infusion) - may require 2x/week vancomycin levels if starting to show accumulation.  ??  Mallory [name] (Trough) prior to dose (preferred):  Once a week  OR 8-10h after dose.  ??   BMP + Mag  :Twice a week   CBC/Diff:  Twice a week  ??  ??  TARGET DRUG LEVELS:  -Vancomycin: Target trough 15-20 mcg/mL  ??  **FAX RESULTS TO: **  Non-CF Patient: [id number]  ??  *PHONE CONTACT*:  Non-CF Patient: [phone]   ??  Janean Sark, PharmD  MDG Team Pharmacist for November 30, 2017 12:38 PM     ??  ??HHPT: Evaluate Mallory Treat, Home Strengthening Program, Home Safety Evaluation    HHOT: Evaluate Mallory Treat, ADL retraining               Appointments which have been scheduled for you    Dec 26, 2017  9:30 AM EST  (Arrive by 9:15 AM)  RETURN BRONCHIECTSIS with Truett Mainland, MD  [facility] PULMONARY SPECIALTY CL [place] [facility]) [address]  [address]  [place] [address]  [phone]          Length of Discharge: I spent greater than 30 mins in the discharge of this patient.

## 2017-11-30 NOTE — Unmapped (Signed)
Problem: Breathing Pattern Ineffective (Adult)  Goal: Effective Oxygenation/Ventilation  Patient will demonstrate the desired outcomes by discharge/transition of care.    Pt refused  Last tx at 2000 hr.

## 2017-11-30 NOTE — Unmapped (Signed)
Pharmacy Note: Transition to Outpatient with Home Infusion  Mallory [name] 61 y.o.  female    Admission: 10/31/2017        ------ Home Infusion Medication and Monitoring -----     Disciplines requested: Nursing and Home IV Antibiotics    Physician to follow patient's care (the person listed here will be responsible for signing ongoing orders): M. Rolm Baptise    Start of Care Date: 11/30/17  Estimate completion of therapy: Feb 2nd 2019    IV ANTIBIOTIC ORDERS:  -Ceftazidime 2g IV every 8 hours   Vancomycin 1.25g q12h     Metronidazole 500mg  q8h (oral, local pharmacy)  Posaconazole 300mg  daily (oral, local pharmacy)    **DO NOT DRAW DRUG LEVELS FROM PORT**    LAB MONITORING ORDERS:    Start labs on 12/04/17    Mon/Tue preferred for ONCE weekly labs  Mon and Thu preferred for TWICE weekly    (Vancomycin): ONCE weekly:Vancomycin trough (drawn prior to start of infusion) - may require 2x/week vancomycin levels if starting to show accumulation.    Posaconazole (Trough) prior to dose (preferred):  Once a week  OR 8-10h after dose.     BMP + Mag  :Twice a week   CBC/Diff:  Twice a week      TARGET DRUG LEVELS:  -Vancomycin: Target trough 15-20 mcg/mL    **FAX RESULTS TO: **  Non-CF Patient: [phone]    *PHONE CONTACT*:  Non-CF Patient: [phone]     Regards  Malachi Suderman, PharmD  MDG Team Pharmacist for November 30, 2017 12:38 PM

## 2017-11-30 NOTE — Unmapped (Signed)
Med G Daily Progress Note      Interval History: Ready to d/c when home infusion teaching is completed. Walking around unit, c/o some vaginal discharge but no discomfort.    Assessment/Plan:    Principal Problem:    Fever  Active Problems:    Tobacco use disorder    Bronchiectasis without complication (RAF-HCC)    History of surgical procedure    COPD (chronic obstructive pulmonary disease) (CMS-HCC)    Post-thoracotomy pain syndrome    Cough    History of Mycobacterium kansasii infection    Pneumonia of right lung due to infectious organism    Bronchopleural fistula (CMS-HCC)  Resolved Problems:    * No resolved hospital problems. *    Mallory [name] is a 61 year old woman with a PMH of COPD, bronchiectasis, and M kansasii infection s/p resection, RULectomy and superior RLL segmentectomy (2013) who presented to [facility] on 10/31/2017 with fever and cough, found to have RML consolidation, concerning for infection, although no definitive organism has been identified, now being treated with empiric broad spectrum ABx/antifungals with plans for prolonged course via home infusion.  ??  Persistent fever, in the setting of persistent RML and new RLL pneumonia  Rigid bronch 11/30 performed without fistula visualized. Continued to fever every night despite 2 weeks of IV meropenem (12/2 - 12/17). Lower respiratory cultures have grown single colonies of Aspergillus, which were not thought to be significant, and galactomannan was negative. CT chest demonstrated interval increase in patchy consolidations, GGO, and asymmetric interstitial opacities in the right lung base, as well as increase in upper RLL opacities. Started on voriconazole, but has been held in the setting of visual symptoms (12/12 - 12/17). Underwent bronch w/ BAL on 12/15 in order to continue the infectious workup, which thus far has been unrevealing (including fungal tests). CT chest 12/20 re-demonstrated right lower lobe consolidation. Rheumatology was consulted, and their index of suspicion for autoimmune disease is low. However, her ANA was positive with a speckled pattern. SPEP pattern demonstrated an irregularity in the gamma region, which may represent monoclonal protein. S/p bronch with endobronchial biopsy 12/21  - ANCA negative  - f/u immunofixation electrophoresis  - f/u UPEP  - bronch w/ endobronchial biopsy 12/21, f/u path, gram stain no organisms seen  - Started posaconazole, vancomycin, cefepime, flagyl for prolonged course per ID with plans for outpt f/u with Dr. Garner Nash to assess for clinical improvement (12/22 - )  - airway clearance w/ HTS 3% nebs, Aerobika, and chest vest    Cachexia  Serum creatinine may be inaccurate in the setting of low BMI.  - 24 hr urine creatinine clearance normal at 118    Elevated IgE  Almost 1000. No formal history of asthma. May consider low-dose steroids.  [ ]  Aspergillus specific precipitins  - comprehensive environmental panel    Thrombocytosis  Stable in the 800s.  - daily CBC    Chronic post-thoracotomy pain syndrome  Chronic pain was consulted and provided recommendations.  - Tylenol 1000 mg q6h PRN  - oxycodone 5-10 mg q6h PRN  - tizanidine 2-4 mg TID PRN  - lidocaine patches    Iron deficiency  Iron 17, transferrin 137.6, and ferritin only 180, so likely iron deficient. Received IV iron 125 mg daily for 5 days (12/7 - 12/11).    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  COPD  - albuterol inhaler PRN  - Flovent    Tobacco dependence  -  nicotine lozenges    FEN/GI/DVT  - GI: home PPI  - DVT: Lovenox 40 mg daily  ??  Code status: DNR and DNI; confirmed with patient on admission    Patient has Advanced Directive with [name]  Healthcare POA: [name] [phone])     Dispo: Med G, floor  __________________________________________________________    Labs/Studies:  Labs and Studies from the last 24hrs per EMR and Reviewed    Objective:  Temp:  [36.4 ??C-37.3 ??C] 36.4 ??C  Heart Rate:  [78-98] 82  Resp:  [16-18] 16  BP: (94-144)/(55-89) 128/89  SpO2:  [96 %-100 %] 100 %,     Intake/Output Summary (Last 24 hours) at 11/29/17 1949  Last data filed at 11/29/17 0456   Gross per 24 hour   Intake              500 ml   Output                0 ml   Net              500 ml      Gen: ambulating in halls and resting in bed, NAD  CV: normal S1, S2, RRR, no murmurs  Pulm: clear on the left, diminished on the right, breathing comfortably on RA  Abd: soft, NT, ND  Extr: WWP, no tenderness or edema of lower extremities  Neuro: A&O x3

## 2017-11-30 NOTE — Unmapped (Signed)
Problem: Patient Care Overview  Goal: Plan of Care Review  Outcome: Progressing  Pt AOx4 this shift without distress. Continue POC.   Goal: Individualization and Mutuality  Outcome: Progressing    Goal: Discharge Needs Assessment  Outcome: Progressing      Problem: Pain, Chronic (Adult)  Goal: Acceptable Pain/Comfort Level and Functional Ability  Patient will demonstrate the desired outcomes by discharge/transition of care.   Outcome: Progressing      Problem: Pneumonia (Adult)  Goal: Signs and Symptoms of Listed Potential Problems Will be Absent, Minimized or Managed (Pneumonia)  Signs and symptoms of listed potential problems will be absent, minimized or managed by discharge/transition of care (reference Pneumonia (Adult) CPG).   Outcome: Progressing

## 2017-12-01 ENCOUNTER — Other Ambulatory Visit: Payer: Self-pay | Admitting: *Deleted

## 2017-12-01 ENCOUNTER — Encounter: Payer: Self-pay | Admitting: Primary Care

## 2017-12-01 ENCOUNTER — Ambulatory Visit (INDEPENDENT_AMBULATORY_CARE_PROVIDER_SITE_OTHER): Payer: 59 | Admitting: Primary Care

## 2017-12-01 ENCOUNTER — Encounter: Payer: Self-pay | Admitting: *Deleted

## 2017-12-01 VITALS — BP 108/72 | HR 77 | Temp 98.1°F | Wt 107.0 lb

## 2017-12-01 DIAGNOSIS — G8912 Acute post-thoracotomy pain: Secondary | ICD-10-CM | POA: Diagnosis not present

## 2017-12-01 DIAGNOSIS — Z792 Long term (current) use of antibiotics: Secondary | ICD-10-CM | POA: Diagnosis not present

## 2017-12-01 DIAGNOSIS — Z09 Encounter for follow-up examination after completed treatment for conditions other than malignant neoplasm: Secondary | ICD-10-CM | POA: Diagnosis not present

## 2017-12-01 DIAGNOSIS — G8929 Other chronic pain: Secondary | ICD-10-CM | POA: Diagnosis not present

## 2017-12-01 DIAGNOSIS — J479 Bronchiectasis, uncomplicated: Secondary | ICD-10-CM | POA: Diagnosis not present

## 2017-12-01 DIAGNOSIS — Z902 Acquired absence of lung [part of]: Secondary | ICD-10-CM | POA: Diagnosis not present

## 2017-12-01 DIAGNOSIS — Z8619 Personal history of other infectious and parasitic diseases: Secondary | ICD-10-CM | POA: Diagnosis not present

## 2017-12-01 DIAGNOSIS — M159 Polyosteoarthritis, unspecified: Secondary | ICD-10-CM | POA: Diagnosis not present

## 2017-12-01 DIAGNOSIS — F17210 Nicotine dependence, cigarettes, uncomplicated: Secondary | ICD-10-CM | POA: Diagnosis not present

## 2017-12-01 DIAGNOSIS — J440 Chronic obstructive pulmonary disease with acute lower respiratory infection: Secondary | ICD-10-CM | POA: Diagnosis not present

## 2017-12-01 LAB — COMPREHENSIVE ENVIRONMENTAL PANEL
ALTERNARIA ALTERNATA IGE: <0.35 kU/L (ref <0.35)
ASPERGILLUS FUMIGATUS IGE: <0.35 kU/L (ref <0.35)
ASPERGILLUS NIGER IGE: <0.35 kU/L (ref <0.35)
BAHIA GRASS IGE: <0.35 kU/L (ref <0.35)
BEECH (AMERICAN) TREE IGE: <0.35 kU/L (ref <0.35)
BERMUDA GRASS IGE: <0.35 kU/L (ref <0.35)
BIRCH (COMMON SILVER) TREE IGE: <0.35 kU/L (ref <0.35)
BOX ELDER TREE IGE: <0.35 kU/L (ref <0.35)
CANDIDA ALBICANS IGE: <0.35 kU/L (ref <0.35)
CAT DANDER IGE: <0.35 kU/L (ref <0.35)
CLADOSPORIUM HERBARUM IGE: <0.35 kU/L (ref <0.35)
COCKLEBUR IGE: <0.35 kU/L (ref <0.35)
COTTONWOOD (WHITE POPLAR) TREE IGE: <0.35 kU/L (ref <0.35)
D. FARINAE IGE: <0.35 kU/L (ref <0.35)
D. PTERONYSSINUS IGE: <0.35 kU/L (ref <0.35)
ELM TREE IGE: <0.35 kU/L (ref <0.35)
EPICOCCUM PURPURASCENS IGE: <0.35 kU/L (ref <0.35)
FUSARIUM PROLIFERATUM IGE: <0.35 kU/L (ref <0.35)
GERMAN COCKROACH IGE: <0.35 kU/L (ref <0.35)
GIANT RAGWEED IGE: <0.35 kU/L (ref <0.35)
GOOSEFOOT (LAMB'S QUARTERS) IGE: <0.35 kU/L (ref <0.35)
JOHNSON GRASS IGE: <0.35 kU/L (ref <0.35)
MAPLE LEAF SYCAMORE IGE: <0.35 kU/L (ref <0.35)
MOUSE IGE: <0.35 kU/L (ref <0.35)
MUCOR RACEMOSUS IGE: <0.35 kU/L (ref <0.35)
MUGWORT IGE: <0.35 kU/L (ref <0.35)
P CHRYSOGENUM (P NOTATUM) IGE: <0.35 kU/L (ref <0.35)
PECAN (HICKORY) IGE: <0.35 kU/L (ref <0.35)
PIGWEED, COMMON IGE: <0.35 kU/L (ref <0.35)
RAGWEED, SHORT (COMMON) IGE: <0.35 kU/L (ref <0.35)
RHIZOPUS NIGRICAN IGE: <0.35 kU/L (ref <0.35)
SHEEP SORREL IGE: <0.35 kU/L (ref <0.35)
TIMOTHY GRASS IGE: <0.35 kU/L (ref <0.35)
TRICHOPHYTON RUBRUM IGE: <0.35 kU/L (ref <0.35)
WALNUT TREE IGE: <0.35 kU/L (ref <0.35)
WHITE ASH TREE IGE: <0.35 kU/L (ref <0.35)
WHITE OAK TREE IGE: <0.35 kU/L (ref <0.35)
WILLOW TREE IGE: <0.35 kU/L (ref <0.35)

## 2017-12-01 LAB — IGG 1, 2, 3, AND 4
IGG 3: 114 mg/dL — ABNORMAL HIGH
IGG 4: 6.2 mg/dL
IGG,TOTAL (MAYO): 960 mg/dL

## 2017-12-01 LAB — IGG 4: IgG subclass 4:MCnc:Pt:Ser:Qn:: 6.2

## 2017-12-01 LAB — GIANT RAGWEED IGE: Lab: <0.35

## 2017-12-01 MED ORDER — HYDROCODONE-ACETAMINOPHEN 7.5-325 MG PO TABS: 1.0000 | ORAL_TABLET | Freq: Four times a day (QID) | ORAL | 0 refills | Status: DC | PRN

## 2017-12-01 MED ORDER — GENERIC EXTERNAL MEDICATION
Status: DC
Start: ? — End: 2017-12-01

## 2017-12-01 MED ORDER — HYDROCODONE-ACETAMINOPHEN 7.5-325 MG PO TABS: 1.0000 | ORAL_TABLET | Freq: Four times a day (QID) | ORAL | 0 refills | Status: AC | PRN

## 2017-12-01 NOTE — Assessment & Plan Note (Addendum)
Admitted to [facility] from 11/27-12/27. Overall doing okay, set up with home health for evaluation, has picked up all required antibiotics. Exam today with overall clear lung sounds.  She did cough up a small amount of thick light green sputum. Will extend FMLA with a tentative return date of 12/29/16, she will update if extension is needed. All hospital notes, labs, imaging reviewed. Follow-up with pulmonology through [facility] as scheduled.

## 2017-12-01 NOTE — Patient Instructions (Signed)
I will get the letter for [facility] faxed later today.  Follow up in 3 months for pain management.  Please notify me if you need anything!  It was a pleasure to see you today!

## 2017-12-01 NOTE — Progress Notes (Signed)
.,   m

## 2017-12-01 NOTE — Patient Outreach (Signed)
Reached Ricketta by phone for initial transition of care phone call. See transition of care template for specifics. [name] [name] has a past medical history of COPD, bronchiectasis, and M kansasii infection s/p resection, right upper lobectomy and superior right lower lobe segmentectomy in 2013.  She [facility] on 10/31/2017 with fever and cough and was found to have right middle lobe consolidation, concerning for infection, although no definitive organism has been identified,she underwent bronchoscopy on 11/03/17, 11/18/17 and 11/24/17. She was discharged to home on 11/30/17 and is now being treated with empiric broad spectrum antibiotics and antifungals with plans for prolonged course ( 6 week total) via home infusion via PICC. Spoke with Cherrelle at her home number, she did not wish to speak at length as her sister was present and she stated "this is really not a good time." She affirmed that [place] was providng IV infusion services and that they made the initial home visit yesterday and instructed  her sister on the infusion procedure and her sister was able to administer the prescribed infusions via her PICC line.  Niti also stated she will not take the oxycodone that was ordered for her at her hospital discharge but will resume her chronic pain management treatment plan with hydrocodone. She states she saw her primary care provider this morning and received a good check up. She states she will see her pulmonologist in [place] on 12/26/17 and has transportation if she is unable to drive herself.  She also stated she will "never return to the hospital again" because of the expense, and the fact that despite a month's hospital stay, they were still not able to determine the cause of her lung issues and that she was discharged "on a wing and a prayer". Pershing Proud this RNCM will send a follow up letter with services [facility] CM can provide with contact information  should she have questions or concerns in the future.   Barrington Ellison RN,CCM,CDE [place] Management Coordinator [facility] [employer] [phone] Office Fax [phone]

## 2017-12-01 NOTE — Assessment & Plan Note (Signed)
28-month supply provided of Norco for which she uses for chronic postoperative thoracotomy pain and arthritis.  No suspicious activity noted on PMP aware site.  Urine drug screen and controlled substance contract up-to-date.  Follow-up in 3 months for pain management encounter.

## 2017-12-01 NOTE — Progress Notes (Signed)
Subjective:    Patient ID: [name] [name], female    DOB: [birth year], 61 y.o.   MRN: [id number]  HPI  [name] [name] is a 61 year old female with a history of COPD, bronchiectasis, M kansasii infection post resection, right upper lobeectomy and superior right lower lobe segmentectomy (2013) who presents today for hospital follow up.  She presented to [facility] on 10/31/17 with a chief complaint of continued fevers and cough that had been present for the prior one month despite numerous regimens of antibiotics. She had also coughed up several staples from a prior surgery in 2013.  During her hospital stay she was noted to have right middle lobe and right lower lobe pneumonia.   She underwent bronchoscopy on 11/03/17. Fevers continued during hospital stay despite treatment with broad spectrum antibiotics. Cultures were taken which grew colonies of Aspergillus. She underwent CT of her chest which noted increase in "patchy consolidations, GGO, and asymmetric interstitial opacities to the right lung base". She underwent another bronchoscopy on 11/18/17 which was inconclusive. Rheumatology was consulted, ANA positive with speckled patten, low suspicion for autoimmune involvement despite labs. She also underwent PICC line placement which is located to the left upper extremity.   She was also consulted by pain management during her stay who recommended lidocaine 5% patches for her respiratory muscle spasms, she noticed improvement on these. She was also provided with a prescription for oxycodone 5 mg for which she's not filled.    She was discharged home on 11/30/17. She has a PICC line and will be receiving IV infusions of antibiotics including cefepime, metronidazole, posaconazole, vancomycin. She has a follow up visit with her pulmonologist scheduled on 12/26/16. She has home health set up who will be coming by tomorrow.  Her sister is administering her antibiotics and was taught proper techniques during her  hospital stay.  She is also needing an extension of her FMLA with a return date of January 25th. This needs to be faxed to [phone] with a leave number of [phone].  She's feeling better and denies fevers. She continues to cough and is expelling thick light green mucous. She denies erythema and pain around PICC line site.   Review of Systems  Constitutional: Positive for fatigue. Negative for fever.  HENT: Positive for congestion.   Respiratory: Positive for cough. Negative for shortness of breath and wheezing.   Cardiovascular: Negative for chest pain.  Skin: Negative for color change.  Neurological: Negative for dizziness and headaches.       Past Medical History:  Diagnosis Date  . Arthritis   . B12 deficiency   . Bronchiectasis ([place])   . Cataracts, bilateral   . Colitis    induced by decongestants, NSAIds  . COPD (chronic obstructive pulmonary disease) ([place])    per CT 11/10/05, will minimal bronchiectasis rll  . Endometriosis   . GERD (gastroesophageal reflux disease)   . 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 Hemorrhoid   . Lichen planus   . 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 Osteoporosis    dexa per gyn  . Pneumothorax, spontaneous, tension    x 2 in her 20's.  has a chest tube for one  . Shoulder dislocation 2015    R shoulder , x 2   . 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 Marital  status: Single    Spouse name: Not on file  . Number of children: 0  . Years of education: Not on file  . Highest education level: Not on file  Social Needs  . Financial resource strain: Not on file  . Food insecurity - worry: Not on file  . Food insecurity - inability: Not on file  . Transportation needs - medical: Not on file  . Transportation needs - non-medical: Not on file  Occupational History  . Occupation: HR Coordinator     Employer:  [employer]    Comment: [employer]   Tobacco Use  . Smoking status: Current Every Day Smoker    Packs/day: 0.40    Years: 38.00    Pack years: 15.20    Types: Cigarettes  . Smokeless tobacco: Never Used  . Tobacco comment: ~ 1 ppd   Substance and Sexual Activity  . Alcohol use: Yes    Alcohol/week: 4.2 oz    Types: 7 Glasses of wine per week    Comment: daily, 1 seving  . Drug use: No  . Sexual activity: Not Currently    Comment: 1st intercourse 61 yo-More than 5 partners  Other Topics Concern  . Not on file  Social History Narrative   sister lives w/ pt    Lost mom 12-2013    Past Surgical History:  Procedure Laterality Date  . ESOPHAGOGASTRODUODENOSCOPY  12/03/2012   Procedure: ESOPHAGOGASTRODUODENOSCOPY (EGD);  Surgeon: [name], MD;  Location: [facility] ENDOSCOPY;  Service: Endoscopy;  Laterality: N/A;  . FLEXIBLE BRONCHOSCOPY  11/05/2012   Procedure: FLEXIBLE BRONCHOSCOPY;  Surgeon: [name], MD;  Location: [facility];  Service: Thoracic;  Laterality: N/A;  . KNEE SURGERY     .not replacement .x8  . LOBECTOMY  11/05/2012   Procedure: LOBECTOMY;  Surgeon: [name], MD;  Location: [facility] OR;  Service: Thoracic;  Laterality: Right;  right upper lobectomy, and part of the right lower lobe  . MEDIASTINOSCOPY  05/25/2012   Procedure: MEDIASTINOSCOPY;  Surgeon: [name], MD;  Location: [facility] OR;  Service: Thoracic;  Laterality: N/A;  . ovarian cyst removed    . THORACOTOMY  11/05/2012   Procedure: THORACOTOMY MAJOR;  Surgeon: [name], MD;  Location: [facility] OR;  Service: Thoracic;  Laterality: Right;  . TONSILLECTOMY    . VIDEO BRONCHOSCOPY  02/15/2012   Procedure: VIDEO BRONCHOSCOPY WITH FLUORO;  Surgeon: [name], MD;  Location: [facility] ENDOSCOPY;  Service: Endoscopy;  Laterality: Bilateral;  WASHING- INTERVENTION BIOPSIES INTERVENTION BRONCHOSCOPY WITH VIDEO    Family History  Problem Relation Age of Onset  . Diabetes Mother        GM  . Heart disease Mother        in  her 45s  . Heart disease Maternal Grandfather   . Cancer Father        sarcoma  . Diabetes Maternal Uncle   . Diabetes Maternal Grandmother   . Colon cancer Neg Hx   . Breast cancer Neg Hx   . Esophageal cancer Neg Hx   . Rectal cancer Neg Hx   . Stomach cancer Neg Hx     Allergies  Allergen Reactions  . Diphenhydramine Hcl Other (See Comments)    Makes lungs bleed  . Pseudoephedrine Hcl Other (See Comments)    Has Ischemic Colitis. Makes colon bleed  . Alendronate Sodium Other (See Comments)    chest pain  . Antihistamines, Chlorpheniramine-Type Other (See Comments)    "  makes lungs bleed"  . Benadryl [Diphenhydramine] Other (See Comments)    "makes lungs bleed" (patient states she can tolerate the newer ones, like Claritin)  . Levofloxacin Anxiety  . Nsaids Other (See Comments)    ischemic colitis  . Other Other (See Comments)    (All anti-inflammatories) ischemic colitis  . Pheniramine Other (See Comments)    "makes lungs bleed"  . Pseudoephedrine Other (See Comments)    Ischemic colitis  . Alendronate Sodium Other (See Comments)    Heart burn  . Guaifenesin Itching  . Robitussin A-C [Guaifenesin-Codeine] Itching and Other (See Comments)    Palms itching    Current Outpatient Medications on File Prior to Visit  Medication Sig Dispense Refill  . acetaminophen (TYLENOL) 500 MG tablet Take 500 mg by mouth every 4 (four) hours as needed for moderate pain or fever. For fever    . albuterol (PROVENTIL HFA) 108 (90 Base) MCG/ACT inhaler Inhale 2 puffs into the lungs every 4 (four) hours as needed for wheezing or shortness of breath.    . ALPRAZolam (XANAX) 0.25 MG tablet Take 1 tablet (0.25 mg total) by mouth at bedtime as needed for anxiety or sleep. 30 tablet 4  . aspirin 81 MG chewable tablet Chew 81 mg by mouth daily.    . calcium carbonate (TUMS) 500 MG chewable tablet Chew by mouth.    . Cefepime HCl 2 GM/100ML SOLN Inject into the vein.    . Cholecalciferol (VITAMIN  D PO) Take 1 tablet by mouth daily.    Marland Kitchen lidocaine (LIDODERM) 5 % Place onto the skin.    Marland Kitchen LYSINE PO Take 1 tablet by mouth daily.    . Magnesium 400 MG TABS Take 1 tablet by mouth daily.    . methocarbamol (ROBAXIN) 500 MG tablet Take 1 tablet (500 mg total) by mouth every 8 (eight) hours as needed for muscle spasms. (Patient taking differently: Take 250 mg by mouth every 8 (eight) hours as needed for muscle spasms. ) 90 tablet 1  . metroNIDAZOLE (FLAGYL) 500 MG tablet Take 500 mg by mouth.    . nicotine polacrilex (NICORETTE) 4 MG lozenge Place 4 mg inside cheek every 3 (three) hours as needed for smoking cessation. For nicotine cravings.    . Ondansetron HCl (ZOFRAN PO) Take 4 mg by mouth as needed.    . pantoprazole (PROTONIX) 40 MG tablet Take 1 tablet (40 mg total) by mouth 2 (two) times daily before a meal. 180 tablet 3  . Potassium Gluconate 595 MG CAPS Take 1 capsule by mouth daily.    Marland Kitchen QVAR REDIHALER 80 MCG/ACT AERB Inhale 2 puffs into the lungs 2 (two) times daily.  11  . sodium chloride HYPERTONIC 3 % nebulizer solution Inhale 4 mL by nebulization Two (2) times a day. Inhale 4 ml twice daily    . magnesium oxide (MAG-OX) 400 MG tablet Take 400 mg by mouth.    . Potassium 99 MG TABS Take by mouth.    . vitamin B-12 (CYANOCOBALAMIN) 100 MCG tablet Take by mouth.     No current facility-administered medications on file prior to visit.     BP 108/72 (BP Location: Right Arm, Patient Position: Sitting, Cuff Size: Normal)   Pulse 77   Temp 98.1 F (36.7 C) (Oral)   Wt 107 lb (48.5 kg)   LMP 11/05/2004   SpO2 98%   BMI 16.76 kg/m    Objective:   Physical Exam  Neck: Neck supple.  Cardiovascular:  Normal rate and regular rhythm.  Pulmonary/Chest: Effort normal and breath sounds normal. No respiratory distress. She has no rales.  Skin: Skin is warm and dry.  PICC line site appears well. Dressing intact.          Assessment & Plan:

## 2017-12-03 DIAGNOSIS — Z760 Encounter for issue of repeat prescription: Secondary | ICD-10-CM | POA: Diagnosis not present

## 2017-12-04 ENCOUNTER — Encounter: Payer: Self-pay | Admitting: *Deleted

## 2017-12-04 DIAGNOSIS — J181 Lobar pneumonia, unspecified organism: Secondary | ICD-10-CM | POA: Diagnosis not present

## 2017-12-04 DIAGNOSIS — F17210 Nicotine dependence, cigarettes, uncomplicated: Secondary | ICD-10-CM | POA: Diagnosis not present

## 2017-12-04 DIAGNOSIS — Z792 Long term (current) use of antibiotics: Secondary | ICD-10-CM | POA: Diagnosis not present

## 2017-12-04 DIAGNOSIS — Z8619 Personal history of other infectious and parasitic diseases: Secondary | ICD-10-CM | POA: Diagnosis not present

## 2017-12-04 DIAGNOSIS — J440 Chronic obstructive pulmonary disease with acute lower respiratory infection: Secondary | ICD-10-CM | POA: Diagnosis not present

## 2017-12-04 DIAGNOSIS — Z902 Acquired absence of lung [part of]: Secondary | ICD-10-CM | POA: Diagnosis not present

## 2017-12-04 MED FILL — HYDROCODON-APAP 7.5-325: 7.5-325 | 30 days supply | Qty: 120 | Fill #0

## 2017-12-06 ENCOUNTER — Encounter: Payer: Self-pay | Admitting: Primary Care

## 2017-12-06 MED ORDER — VANCOMYCIN 1.75 GRAM/250 ML IN 0.9 % SODIUM CHLORIDE INTRAVENOUS SOLN
Freq: Two times a day (BID) | INTRAVENOUS | 0 refills | 0.00000 days
Start: 2017-12-06 — End: 2018-01-06

## 2017-12-06 NOTE — Unmapped (Signed)
Outpatient Pharmacist Consult Note: Therapeutic Drug Level Monitoring    Mallory [name] is a 62 y.o. female on outpatient IV antibiotics for broad spectrum coverage d/t continual intermittent fevers.     Spoke with Mallory [name] this morning to confirm her administration time as reported below. Per Mallory [name], wants to know if Cefepime can be changed to Q12H, haven't a really difficult time getting in evening dose (too late which is why it's about every 7 hours instead). Also states at some point, would like to return to work in order to prevent loss of benefits and states the mid day dose is going to be a big barrier. Currently, her sister is helping with administration of medication.   Mallory [name] also reports constipation that has not been resolved with once daily Miralax and senna. She did reach out to her PCP but also wanted to inquire if any of her medications were causing the increased constipation. She reports her pain medication regimen has not changed.      Outpatient IV Antimicrobial Therapy:  Start Date: 11/09/17  End Date: 01/06/18    IV Antibiotics Ordered:  -Cefepime 2g IV every 8 hours  -Vancomycin 1250mg  IV every 12 hours     Oral antimicrobials:  -Posaconazole 300mg  daily  -Metronidazole 500mg  TID     Patient Parameters   Patient weight:   Wt Readings from Last 1 Encounters:   11/15/17 46.8 kg (103 lb 3.2 oz)     Drug Administration Time:   Cefepime 0645/1345/1945  Vancomycin 0700/1900  Posaconazole 1000     Goal Drug Levels:   -Vancomycin: Target trough 15-20 mcg/mL     Lab results received 12/06/17    CHEM   12/04/17 11/30/17   BUN  3 3   SCR  0.57 0.42     CBC    12/04/17 11/30/17   WBC 9.3 8.8   HGB  8.9 (L) 7.9 (L)   HCT 26.2 (L) 25.5 (L)   PLT  776 (H) 920 (H)   ANC 6.6 6.4   AbsLym 1.6 1.2 (L)   AbsMon 0.7 0.6   AbsEos 0.3 0.3   AbsBas 0.1 0     Measured Drug Levels:   Vancomycin Trough: 7.8 mcg/mL (12/04/17 0858-Drawn ~2hr(s) late)      Patient-Specific Pharmacokinetic Parameters:    Extrapolated true trough: 9.3 mcg/mL  Vd = 58.1 L, ke = 0101 hr-1     Recommendations at this time unless clinically indicated otherwise:    -Current levels Subtherapeutic.   -Increase Vancomycin 1750mg  IV every 12 hours for est trough 84mcg/mL. Long duration and expect accumulation over time will likely have patient within goal troughs and patient already having trouble with Q8H regimen.  -For when she returns to work, Cefepime- if can't change to Q12H then it can be made in the infusion balls to help with self administration (she's worried b/c currently in syringe and her sister helps administer).  -Advised patient that we would discuss with Dr. Garner Nash the longer term goal when she is ready to go back to work.    Labs:  -Next Labs Due on 12/11/17  -CBC with diff  -Vancomycin trough (1 hr prior to start of infusion time)  -BMP (K has been low)  -Posaconazole trough    Orders above were called into [facility] [phone] (Mallory [name]).    Re: Constipation  -Advised patient to increase Miralax up to TID. Can also add docusate 100mg  BID. If no improvements, advised to call clinic.  Posaconazole level:  -Does not look like it was collected, added to next week labs.     Anell Barr, PharmD, BCPS, CPP  Clinical Pharmacist Practitioner  [facility] / [facility]  Pager: [phone]    Consult note routed back to: Mallory [name] and Mallory Sack, RN (Pulmonary Clinic Nurse Coordinator)

## 2017-12-08 ENCOUNTER — Encounter (HOSPITAL_COMMUNITY): Payer: Self-pay | Admitting: Psychiatry

## 2017-12-08 ENCOUNTER — Ambulatory Visit (INDEPENDENT_AMBULATORY_CARE_PROVIDER_SITE_OTHER): Payer: 59 | Admitting: Psychiatry

## 2017-12-08 VITALS — BP 120/68 | HR 83 | Ht 66.0 in | Wt 111.6 lb

## 2017-12-08 DIAGNOSIS — F419 Anxiety disorder, unspecified: Secondary | ICD-10-CM | POA: Diagnosis not present

## 2017-12-08 DIAGNOSIS — Z87891 Personal history of nicotine dependence: Secondary | ICD-10-CM

## 2017-12-08 DIAGNOSIS — F4323 Adjustment disorder with mixed anxiety and depressed mood: Secondary | ICD-10-CM | POA: Diagnosis not present

## 2017-12-08 DIAGNOSIS — G4700 Insomnia, unspecified: Secondary | ICD-10-CM

## 2017-12-08 DIAGNOSIS — J189 Pneumonia, unspecified organism: Secondary | ICD-10-CM | POA: Diagnosis not present

## 2017-12-08 DIAGNOSIS — J479 Bronchiectasis, uncomplicated: Secondary | ICD-10-CM | POA: Diagnosis not present

## 2017-12-08 MED ORDER — ALPRAZOLAM 0.25 MG PO TABS: 0.2500 mg | ORAL_TABLET | Freq: Every evening | ORAL | 5 refills | Status: DC | PRN

## 2017-12-08 NOTE — Progress Notes (Signed)
Psychiatric Initial Adult Assessment   Patient Identification: [name] [name] MRN:  [id number] Date of Evaluation:  12/08/2017 Referral Source [place] Visit Diagnosis:    ICD-10-CM   1. Insomnia, unspecified type G47.00 ALPRAZolam (XANAX) 0.25 MG tablet    History of Present Illness:   Today the patient describes being through a great deal since I've seen her. She essentially had a month of being [facility] a [facility] setting with worsening of her pulmonary status. She had pneumonia and ultimately received 4 antibiotics with an extensive's medical hospitalization. At this time she is on work leave. She is on FMLA. She has 3 months and she's been through about 3 weeks of her benefits. The patient's mood is actually fairly good. She has a fairly good support system. She has 2 sisters and the people from work and been very supportive. The patient actually likes work a great deal. Her hope is that she completely recovers gets back to her work setting. The patient also has a strong cigarette addiction would like to smoke again although she knows this would be [place]. At this time she is not smoking she's not drinking. The patient really denies daily depression. For the most part she sleeping well. She has no appetite yet she maintains her weight. He drinks a lot of supplements. Her other big physical problem is extensive intense constipation. Is not clear the etiology of this. The patient has a good primary care system as well as a devoted pulmonologist. Patient is getting excellent medical care. Generally the patient likes to sew likes to smoke and doesn't watch TV very much at all. Her energy level is fair. She denies being suicidal. She actually is functioning fairly well. She continues to slowly physically recovered. She is not all that anxious. She benefits a great deal by taking the lowest dose of Xanax at night for just sleep.  Associated Signs/Symptoms: Depression  Symptoms:  insomnia, (Hypo) Manic Symptoms:   Anxiety Symptoms:  Excessive Worry, Psychotic Symptoms:   PTSD Symptoms:   Past Psychiatric History: Only Xanax 0.5 mg   Previous Psychotropic Medications: Yes   Substance Abuse History in the last 12 months:  No.  Consequences of Substance Abuse:   Past Medical History:  Past Medical History:  Diagnosis Date  . Arthritis   . B12 deficiency   . Bronchiectasis ([place])   . Cataracts, bilateral   . Colitis    induced by decongestants, NSAIds  . COPD (chronic obstructive pulmonary disease) ([place])    per CT 11/10/05, will minimal bronchiectasis rll  . Endometriosis   . GERD (gastroesophageal reflux disease)   . 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 Hemorrhoid   . Lichen planus   . 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 Osteoporosis    dexa per gyn  . Pneumothorax, spontaneous, tension    x 2 in her 20's.  has a chest tube for one  . Shoulder dislocation 2015    R shoulder , x 2   . 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 ESOPHAGOGASTRODUODENOSCOPY  12/03/2012   Procedure: ESOPHAGOGASTRODUODENOSCOPY (EGD);  Surgeon: [name], MD;  Location: [facility] ENDOSCOPY;  Service: Endoscopy;  Laterality: N/A;  . FLEXIBLE BRONCHOSCOPY  11/05/2012   Procedure: FLEXIBLE BRONCHOSCOPY;  Surgeon: [name], MD;  Location: [facility];  Service:  Thoracic;  Laterality: N/A;  . KNEE SURGERY     .not replacement .x8  . LOBECTOMY  11/05/2012   Procedure: LOBECTOMY;  Surgeon: [name], MD;  Location: [facility] OR;  Service: Thoracic;  Laterality: Right;  right upper lobectomy, and part of the right lower lobe  . MEDIASTINOSCOPY  05/25/2012   Procedure: MEDIASTINOSCOPY;  Surgeon: [name], MD;  Location: [facility] OR;  Service: Thoracic;  Laterality: N/A;  . ovarian cyst removed    . THORACOTOMY  11/05/2012   Procedure:  THORACOTOMY MAJOR;  Surgeon: [name], MD;  Location: [facility] OR;  Service: Thoracic;  Laterality: Right;  . TONSILLECTOMY    . VIDEO BRONCHOSCOPY  02/15/2012   Procedure: VIDEO BRONCHOSCOPY WITH FLUORO;  Surgeon: [name], MD;  Location: [facility] ENDOSCOPY;  Service: Endoscopy;  Laterality: Bilateral;  WASHING- INTERVENTION BIOPSIES INTERVENTION BRONCHOSCOPY WITH VIDEO    Family Psychiatric History:   Family History:  Family History  Problem Relation Age of Onset  . Diabetes Mother        GM  . Heart disease Mother        in her 40s  . Heart disease Maternal Grandfather   . Cancer Father        sarcoma  . Diabetes Maternal Uncle   . Diabetes Maternal Grandmother   . Colon cancer Neg Hx   . Breast cancer Neg Hx   . Esophageal cancer Neg Hx   . Rectal cancer Neg Hx   . Stomach cancer Neg Hx     Social History:   Social History   Socioeconomic History  . Marital status: Single    Spouse name: None  . Number of children: 0  . Years of education: None  . Highest education level: None  Social Needs  . Financial resource strain: None  . Food insecurity - worry: None  . Food insecurity - inability: None  . Transportation needs - medical: None  . Transportation needs - non-medical: None  Occupational History  . Occupation: HR Coordinator     Employer: [employer]    Comment: [employer]   Tobacco Use  . Smoking status: Former Smoker    Packs/day: 0.40    Years: 38.00    Pack years: 15.20    Types: Cigarettes  . Smokeless tobacco: Never Used  . Tobacco comment: stopped due to illness  Substance and Sexual Activity  . Alcohol use: Yes    Alcohol/week: 0.0 oz  . Drug use: No  . Sexual activity: Not Currently    Comment: 1st intercourse 62 yo-More than 5 partners  Other Topics Concern  . None  Social History Narrative   sister lives w/ pt    Lost mom 12-2013    Additional Social History:   Allergies:   Allergies  Allergen Reactions  . Diphenhydramine Hcl Other (See  Comments)    Makes lungs bleed  . Pseudoephedrine Hcl Other (See Comments)    Has Ischemic Colitis. Makes colon bleed  . Alendronate Sodium Other (See Comments)    chest pain  . Antihistamines, Chlorpheniramine-Type Other (See Comments)    "makes lungs bleed"  . Benadryl [Diphenhydramine] Other (See Comments)    "makes lungs bleed" (patient states she can tolerate the newer ones, like Claritin)  . Levofloxacin Anxiety  . Nsaids Other (See Comments)    ischemic colitis  . Other Other (See Comments)    (All anti-inflammatories) ischemic colitis  . Pheniramine Other (See Comments)    "makes  lungs bleed"  . Pseudoephedrine Other (See Comments)    Ischemic colitis  . Alendronate Sodium Other (See Comments)    Heart burn  . Guaifenesin Itching  . Robitussin A-C [Guaifenesin-Codeine] Itching and Other (See Comments)    Palms itching    Metabolic Disorder Labs: Lab Results  Component Value Date   HGBA1C 5.6 04/28/2017   No results found for: PROLACTIN Lab Results  Component Value Date   CHOL 183 04/28/2017   TRIG 51.0 04/28/2017   HDL 83.90 04/28/2017   CHOLHDL 2 04/28/2017   VLDL 10.2 04/28/2017   LDLCALC 89 04/28/2017   LDLCALC 115 (H) 04/25/2016     Current Medications: Current Outpatient Medications  Medication Sig Dispense Refill  . acetaminophen (TYLENOL) 500 MG tablet Take 500 mg by mouth every 4 (four) hours as needed for moderate pain or fever. For fever    . albuterol (PROVENTIL HFA) 108 (90 Base) MCG/ACT inhaler Inhale 2 puffs into the lungs every 4 (four) hours as needed for wheezing or shortness of breath.    . ALPRAZolam (XANAX) 0.25 MG tablet Take 1 tablet (0.25 mg total) by mouth at bedtime as needed for anxiety or sleep. 30 tablet 5  . aspirin 81 MG chewable tablet Chew 81 mg by mouth daily.    . calcium carbonate (TUMS) 500 MG chewable tablet Chew by mouth.    . Cefepime HCl 2 GM/100ML SOLN Inject into the vein.    . Cholecalciferol (VITAMIN D PO) Take 1  tablet by mouth daily.    Marland Kitchen HYDROcodone-acetaminophen (NORCO) 7.5-325 MG tablet Take 1 tablet by mouth every 6 (six) hours as needed for moderate pain. 120 tablet 0  . HYDROcodone-acetaminophen (NORCO) 7.5-325 MG tablet Take 1 tablet by mouth every 6 (six) hours as needed for moderate pain. 120 tablet 0  . lidocaine (LIDODERM) 5 % Place onto the skin.    Marland Kitchen LYSINE PO Take 1 tablet by mouth daily.    . Magnesium 400 MG TABS Take 1 tablet by mouth daily.    . magnesium oxide (MAG-OX) 400 MG tablet Take 400 mg by mouth.    . methocarbamol (ROBAXIN) 500 MG tablet Take 1 tablet (500 mg total) by mouth every 8 (eight) hours as needed for muscle spasms. (Patient taking differently: Take 250 mg by mouth every 8 (eight) hours as needed for muscle spasms. ) 90 tablet 1  . metroNIDAZOLE (FLAGYL) 500 MG tablet Take 500 mg by mouth.    . nicotine polacrilex (NICORETTE) 4 MG lozenge Place 4 mg inside cheek every 3 (three) hours as needed for smoking cessation. For nicotine cravings.    . Ondansetron HCl (ZOFRAN PO) Take 4 mg by mouth as needed.    . pantoprazole (PROTONIX) 40 MG tablet Take 1 tablet (40 mg total) by mouth 2 (two) times daily before a meal. 180 tablet 3  . POSACONAZOLE PO Take 300 mg by mouth daily at 12 noon.    . Potassium 99 MG TABS Take by mouth.    . Potassium Gluconate 595 MG CAPS Take 1 capsule by mouth daily.    Marland Kitchen QVAR REDIHALER 80 MCG/ACT AERB Inhale 2 puffs into the lungs 2 (two) times daily.  11  . sodium chloride HYPERTONIC 3 % nebulizer solution Inhale 4 mL by nebulization Two (2) times a day. Inhale 4 ml twice daily    . vancomycin IVPB Inject into the vein.    . vitamin B-12 (CYANOCOBALAMIN) 100 MCG tablet Take by mouth.  No current facility-administered medications for this visit.     Neurologic: Headache: No Seizure: No Paresthesias:No  Musculoskeletal: Strength & Muscle Tone: decreased Gait & Station: unsteady Patient leans: N/A  Psychiatric Specialty Exam: ROS   Blood pressure 120/68, pulse 83, height _0  (1.676 m), weight 111 lb 9.6 oz (50.6 kg), last menstrual period 11/05/2004.Body mass index is 18.01 kg/m.  General Appearance: Casual  Eye Contact:  Good  Speech:  Clear and Coherent  Volume:  Normal  Mood:  NA  Affect  Thought Process: normal   Goal Directed  Orientation:  Full (Time, Place, and Person)  Thought Content:  Logical  Suicidal Thoughts:  No  Homicidal Thoughts:  No  Memory:  NA  Judgement:  Good  Insight:  Good  Psychomotor Activity:  Normal  Concentration:    Recall:  Good  Fund of Knowledge:Good  Language: Good  Akathisia:  No  Handed:  Right  AIMS (if indicated):    Assets:    ADL's:  Intact  Cognition: WNL  Sleep:      Treatment Plan Summary: At this time I will contribute to this patient's care by seeing her every 5 months and doing some supportive psychotherapy when I see her. The patient is signed a DO NOT INTUBATE form. She never wants to go to the hospital again. She never wants to be intubated again. At this time the patient is optimistic about possibly getting back to work but she says she's also realistic. She sees no therapist at this time. She'll continue taking Xanax 0.25 mg at night. She'll return to see me in 5 months. She'll continue in her medical care.  Jerral Ralph, MD 1/4/20199:49 AM

## 2017-12-11 ENCOUNTER — Ambulatory Visit: Admit: 2017-12-11 | Discharge: 2017-12-12 | Payer: PRIVATE HEALTH INSURANCE

## 2017-12-11 ENCOUNTER — Inpatient Hospital Stay (HOSPITAL_COMMUNITY)
Admission: EM | Admit: 2017-12-11 | Discharge: 2017-12-13 | DRG: 194 | Disposition: A | Discharge status: Hospice | Payer: 59 | Attending: Family Medicine | Admitting: Family Medicine

## 2017-12-11 ENCOUNTER — Emergency Department (HOSPITAL_COMMUNITY): Payer: 59

## 2017-12-11 ENCOUNTER — Encounter (HOSPITAL_COMMUNITY): Payer: Self-pay | Admitting: *Deleted

## 2017-12-11 DIAGNOSIS — R0902 Hypoxemia: Secondary | ICD-10-CM | POA: Diagnosis present

## 2017-12-11 DIAGNOSIS — J471 Bronchiectasis with (acute) exacerbation: Secondary | ICD-10-CM | POA: Diagnosis not present

## 2017-12-11 DIAGNOSIS — D62 Acute posthemorrhagic anemia: Secondary | ICD-10-CM | POA: Diagnosis present

## 2017-12-11 DIAGNOSIS — D638 Anemia in other chronic diseases classified elsewhere: Secondary | ICD-10-CM | POA: Diagnosis not present

## 2017-12-11 DIAGNOSIS — J189 Pneumonia, unspecified organism: Principal | ICD-10-CM | POA: Diagnosis present

## 2017-12-11 DIAGNOSIS — K219 Gastro-esophageal reflux disease without esophagitis: Secondary | ICD-10-CM | POA: Diagnosis present

## 2017-12-11 DIAGNOSIS — Z9981 Dependence on supplemental oxygen: Secondary | ICD-10-CM

## 2017-12-11 DIAGNOSIS — Z66 Do not resuscitate: Secondary | ICD-10-CM | POA: Diagnosis present

## 2017-12-11 DIAGNOSIS — J860 Pyothorax with fistula: Secondary | ICD-10-CM | POA: Diagnosis not present

## 2017-12-11 DIAGNOSIS — R0602 Shortness of breath: Secondary | ICD-10-CM | POA: Diagnosis not present

## 2017-12-11 DIAGNOSIS — M810 Age-related osteoporosis without current pathological fracture: Secondary | ICD-10-CM | POA: Diagnosis present

## 2017-12-11 DIAGNOSIS — Z8249 Family history of ischemic heart disease and other diseases of the circulatory system: Secondary | ICD-10-CM | POA: Diagnosis not present

## 2017-12-11 DIAGNOSIS — Z902 Acquired absence of lung [part of]: Secondary | ICD-10-CM

## 2017-12-11 DIAGNOSIS — Z87891 Personal history of nicotine dependence: Secondary | ICD-10-CM

## 2017-12-11 DIAGNOSIS — J440 Chronic obstructive pulmonary disease with acute lower respiratory infection: Secondary | ICD-10-CM | POA: Diagnosis present

## 2017-12-11 DIAGNOSIS — F419 Anxiety disorder, unspecified: Secondary | ICD-10-CM | POA: Diagnosis present

## 2017-12-11 DIAGNOSIS — E46 Unspecified protein-calorie malnutrition: Secondary | ICD-10-CM | POA: Diagnosis not present

## 2017-12-11 DIAGNOSIS — Z7982 Long term (current) use of aspirin: Secondary | ICD-10-CM

## 2017-12-11 DIAGNOSIS — R042 Hemoptysis: Secondary | ICD-10-CM | POA: Diagnosis present

## 2017-12-11 DIAGNOSIS — R079 Chest pain, unspecified: Secondary | ICD-10-CM | POA: Diagnosis not present

## 2017-12-11 DIAGNOSIS — J449 Chronic obstructive pulmonary disease, unspecified: Secondary | ICD-10-CM | POA: Diagnosis not present

## 2017-12-11 DIAGNOSIS — J9620 Acute and chronic respiratory failure, unspecified whether with hypoxia or hypercapnia: Secondary | ICD-10-CM | POA: Diagnosis not present

## 2017-12-11 DIAGNOSIS — A310 Pulmonary mycobacterial infection: Secondary | ICD-10-CM | POA: Diagnosis present

## 2017-12-11 DIAGNOSIS — Y95 Nosocomial condition: Secondary | ICD-10-CM | POA: Diagnosis present

## 2017-12-11 DIAGNOSIS — Z7189 Other specified counseling: Secondary | ICD-10-CM | POA: Diagnosis not present

## 2017-12-11 DIAGNOSIS — E876 Hypokalemia: Secondary | ICD-10-CM | POA: Diagnosis not present

## 2017-12-11 DIAGNOSIS — Z833 Family history of diabetes mellitus: Secondary | ICD-10-CM

## 2017-12-11 DIAGNOSIS — Z515 Encounter for palliative care: Secondary | ICD-10-CM | POA: Diagnosis not present

## 2017-12-11 DIAGNOSIS — G4700 Insomnia, unspecified: Secondary | ICD-10-CM

## 2017-12-11 DIAGNOSIS — R05 Cough: Secondary | ICD-10-CM | POA: Diagnosis not present

## 2017-12-11 DIAGNOSIS — J479 Bronchiectasis, uncomplicated: Principal | ICD-10-CM

## 2017-12-11 LAB — RESPIRATORY PANEL BY PCR

## 2017-12-11 LAB — CBC WITH DIFFERENTIAL/PLATELET
Basophils Absolute: 0.1 10*3/uL (ref 0.0–0.1)
Basophils Relative: 1 %
Eosinophils Absolute: 0.4 10*3/uL (ref 0.0–0.7)
Eosinophils Relative: 4 %
HEMATOCRIT: 31.1 % — AB (ref 36.0–46.0)
Hemoglobin: 9.9 g/dL — ABNORMAL LOW (ref 12.0–15.0)
LYMPHS ABS: 1.2 10*3/uL (ref 0.7–4.0)
LYMPHS PCT: 13 %
MCH: 31.2 pg (ref 26.0–34.0)
MCHC: 31.8 g/dL (ref 30.0–36.0)
MCV: 98.1 fL (ref 78.0–100.0)
MONO ABS: 1.1 10*3/uL — AB (ref 0.1–1.0)
MONOS PCT: 12 %
NEUTROS ABS: 6.3 10*3/uL (ref 1.7–7.7)
Neutrophils Relative %: 70 %
Platelets: 642 10*3/uL — ABNORMAL HIGH (ref 150–400)
RBC: 3.17 MIL/uL — ABNORMAL LOW (ref 3.87–5.11)
RDW: 17.6 % — AB (ref 11.5–15.5)
WBC: 9.1 10*3/uL (ref 4.0–10.5)

## 2017-12-11 LAB — I-STAT TROPONIN, ED: Troponin i, poc: 0.01 ng/mL (ref 0.00–0.08)

## 2017-12-11 LAB — COMPREHENSIVE METABOLIC PANEL
ALBUMIN: 2.4 g/dL — AB (ref 3.5–5.0)
ALK PHOS: 71 U/L (ref 38–126)
ALT: 10 U/L — ABNORMAL LOW (ref 14–54)
ANION GAP: 14 (ref 5–15)
AST: 28 U/L (ref 15–41)
BUN: 4 mg/dL — ABNORMAL LOW (ref 6–20)
CALCIUM: 8 mg/dL — AB (ref 8.9–10.3)
CHLORIDE: 99 mmol/L — AB (ref 101–111)
CO2: 27 mmol/L (ref 22–32)
Creatinine, Ser: 0.76 mg/dL (ref 0.44–1.00)
GFR calc Af Amer: >60 mL/min (ref >60)
GFR calc non Af Amer: >60 mL/min (ref >60)
Glucose, Bld: 107 mg/dL — ABNORMAL HIGH (ref 65–99)
Potassium: 2.1 mmol/L — CL (ref 3.5–5.1)
SODIUM: 140 mmol/L (ref 135–145)
Total Bilirubin: 1 mg/dL (ref 0.3–1.2)
Total Protein: 6.3 g/dL — ABNORMAL LOW (ref 6.5–8.1)

## 2017-12-11 LAB — CBC
HCT: 29.9 % — ABNORMAL LOW (ref 36.0–46.0)
HEMATOCRIT: 28.4 % — AB (ref 36.0–46.0)
Hemoglobin: 9.4 g/dL — ABNORMAL LOW (ref 12.0–15.0)
Hemoglobin: 9.2 g/dL — ABNORMAL LOW (ref 12.0–15.0)
MCH: 30.9 pg (ref 26.0–34.0)
MCH: 31.8 pg (ref 26.0–34.0)
MCHC: 31.4 g/dL (ref 30.0–36.0)
MCHC: 32.4 g/dL (ref 30.0–36.0)
MCV: 98.4 fL (ref 78.0–100.0)
MCV: 98.3 fL (ref 78.0–100.0)
PLATELETS: 545 10*3/uL — AB (ref 150–400)
Platelets: 607 10*3/uL — ABNORMAL HIGH (ref 150–400)
RBC: 3.04 MIL/uL — ABNORMAL LOW (ref 3.87–5.11)
RBC: 2.89 MIL/uL — ABNORMAL LOW (ref 3.87–5.11)
RDW: 17.7 % — AB (ref 11.5–15.5)
RDW: 17.9 % — AB (ref 11.5–15.5)
WBC: 9.8 10*3/uL (ref 4.0–10.5)
WBC: 9.1 10*3/uL (ref 4.0–10.5)

## 2017-12-11 LAB — I-STAT CHEM 8, ED
Calcium, Ion: 0.98 mmol/L — ABNORMAL LOW (ref 1.15–1.40)
Chloride: 100 mmol/L — ABNORMAL LOW (ref 101–111)
Creatinine, Ser: 0.7 mg/dL (ref 0.44–1.00)
Glucose, Bld: 109 mg/dL — ABNORMAL HIGH (ref 65–99)
HCT: 29 % — ABNORMAL LOW (ref 36.0–46.0)
Hemoglobin: 9.9 g/dL — ABNORMAL LOW (ref 12.0–15.0)
Potassium: 2.1 mmol/L — CL (ref 3.5–5.1)
Sodium: 143 mmol/L (ref 135–145)
TCO2: 32 mmol/L (ref 22–32)

## 2017-12-11 LAB — HIV ANTIBODY (ROUTINE TESTING W REFLEX): HIV SCREEN 4TH GENERATION: NONREACTIVE

## 2017-12-11 LAB — PROTIME-INR
INR: 1.28
Prothrombin Time: 15.9 seconds — ABNORMAL HIGH (ref 11.4–15.2)

## 2017-12-11 LAB — GLUCOSE, CAPILLARY: Glucose-Capillary: 83 mg/dL (ref 65–99)

## 2017-12-11 LAB — VANCOMYCIN, TROUGH: VANCOMYCIN TR: 17 ug/mL (ref 15–20)

## 2017-12-11 LAB — MAGNESIUM: MAGNESIUM: 1.8 mg/dL (ref 1.7–2.4)

## 2017-12-11 LAB — INFLUENZA PANEL BY PCR (TYPE A & B)
Influenza A By PCR: NEGATIVE
Influenza B By PCR: NEGATIVE

## 2017-12-11 LAB — TYPE AND SCREEN
ABO/RH(D): A POS
Antibody Screen: NEGATIVE

## 2017-12-11 LAB — PROCALCITONIN: Procalcitonin: <0.1 ng/mL

## 2017-12-11 LAB — CBG MONITORING, ED: GLUCOSE-CAPILLARY: 90 mg/dL (ref 65–99)

## 2017-12-11 MED ORDER — ACETAMINOPHEN 325 MG PO TABS
650.0000 mg | ORAL_TABLET | Freq: Four times a day (QID) | ORAL | Status: DC | PRN
  Administered 2017-12-11 – 2017-12-12 (×3): 650 mg via ORAL
  Filled 2017-12-11 (×3): qty 2

## 2017-12-11 MED ORDER — SODIUM CHLORIDE 3 % IN NEBU
4.0000 mL | INHALATION_SOLUTION | Freq: Two times a day (BID) | RESPIRATORY_TRACT | Status: DC
  Administered 2017-12-11: 4 mL via RESPIRATORY_TRACT
  Filled 2017-12-11 (×2): qty 4

## 2017-12-11 MED ORDER — VANCOMYCIN HCL 10 G IV SOLR
1250.0000 mg | Freq: Three times a day (TID) | INTRAVENOUS | Status: DC
  Administered 2017-12-11 – 2017-12-12 (×2): 1250 mg via INTRAVENOUS
  Filled 2017-12-11 (×3): qty 1250

## 2017-12-11 MED ORDER — POTASSIUM CHLORIDE 10 MEQ/100ML IV SOLN
10.0000 meq | INTRAVENOUS | Status: AC
  Administered 2017-12-11 (×6): 10 meq via INTRAVENOUS
  Filled 2017-12-11 (×6): qty 100

## 2017-12-11 MED ORDER — BENZONATATE 100 MG PO CAPS: 200.0000 mg | ORAL_CAPSULE | Freq: Two times a day (BID) | ORAL | Status: DC | PRN

## 2017-12-11 MED ORDER — MAGNESIUM SULFATE 2 GM/50ML IV SOLN
2.0000 g | Freq: Once | INTRAVENOUS | Status: AC
  Administered 2017-12-11: 2 g via INTRAVENOUS
  Filled 2017-12-11: qty 50

## 2017-12-11 MED ORDER — ALPRAZOLAM 0.25 MG PO TABS
0.2500 mg | ORAL_TABLET | Freq: Every evening | ORAL | Status: DC | PRN
  Administered 2017-12-11: 0.25 mg via ORAL
  Filled 2017-12-11: qty 1

## 2017-12-11 MED ORDER — MORPHINE SULFATE (PF) 4 MG/ML IV SOLN
2.0000 mg | INTRAVENOUS | Status: DC | PRN
  Administered 2017-12-11 – 2017-12-12 (×4): 2 mg via INTRAVENOUS
  Filled 2017-12-11 (×4): qty 1

## 2017-12-11 MED ORDER — POTASSIUM GLUCONATE 595 MG PO CAPS: 1.0000 | ORAL_CAPSULE | Freq: Every day | ORAL | Status: DC

## 2017-12-11 MED ORDER — ACETAMINOPHEN 500 MG PO TABS
500.0000 mg | ORAL_TABLET | Freq: Once | ORAL | Status: AC
  Administered 2017-12-11: 500 mg via ORAL
  Filled 2017-12-11: qty 1

## 2017-12-11 MED ORDER — LIDOCAINE 5 % EX PTCH
1.0000 | MEDICATED_PATCH | Freq: Every day | CUTANEOUS | Status: DC | PRN
Start: 2017-12-11 — End: 2017-12-13

## 2017-12-11 MED ORDER — IOPAMIDOL (ISOVUE-370) INJECTION 76%
INTRAVENOUS | Status: AC
  Administered 2017-12-11: 80 mL
  Filled 2017-12-11: qty 100

## 2017-12-11 MED ORDER — ALBUTEROL SULFATE (2.5 MG/3ML) 0.083% IN NEBU: 3.0000 mL | INHALATION_SOLUTION | RESPIRATORY_TRACT | Status: DC | PRN

## 2017-12-11 MED ORDER — PANTOPRAZOLE SODIUM 40 MG PO TBEC
40.0000 mg | DELAYED_RELEASE_TABLET | Freq: Two times a day (BID) | ORAL | Status: DC
  Administered 2017-12-11 – 2017-12-13 (×5): 40 mg via ORAL
  Filled 2017-12-11 (×5): qty 1

## 2017-12-11 MED ORDER — METHOCARBAMOL 500 MG PO TABS
250.0000 mg | ORAL_TABLET | Freq: Three times a day (TID) | ORAL | Status: DC | PRN
  Filled 2017-12-11: qty 1

## 2017-12-11 MED ORDER — PIPERACILLIN-TAZOBACTAM 3.375 G IVPB 30 MIN
3.3750 g | Freq: Once | INTRAVENOUS | Status: AC
  Administered 2017-12-11: 3.375 g via INTRAVENOUS
  Filled 2017-12-11: qty 50

## 2017-12-11 MED ORDER — ACETAMINOPHEN 650 MG RE SUPP: 650.0000 mg | Freq: Four times a day (QID) | RECTAL | Status: DC | PRN

## 2017-12-11 MED ORDER — BUDESONIDE 0.5 MG/2ML IN SUSP
0.5000 mg | Freq: Two times a day (BID) | RESPIRATORY_TRACT | Status: DC
  Administered 2017-12-11 – 2017-12-13 (×3): 0.5 mg via RESPIRATORY_TRACT
  Filled 2017-12-11 (×7): qty 2

## 2017-12-11 MED ORDER — ONDANSETRON HCL 4 MG PO TABS: 4.0000 mg | ORAL_TABLET | Freq: Four times a day (QID) | ORAL | Status: DC | PRN

## 2017-12-11 MED ORDER — POSACONAZOLE 100 MG PO TBEC
300.0000 mg | DELAYED_RELEASE_TABLET | Freq: Every day | ORAL | Status: DC
  Administered 2017-12-11: 300 mg via ORAL
  Filled 2017-12-11 (×3): qty 3

## 2017-12-11 MED ORDER — ONDANSETRON HCL 4 MG/2ML IJ SOLN: 4.0000 mg | Freq: Four times a day (QID) | INTRAMUSCULAR | Status: DC | PRN

## 2017-12-11 MED ORDER — MAGNESIUM OXIDE 400 (241.3 MG) MG PO TABS
400.0000 mg | ORAL_TABLET | Freq: Every day | ORAL | Status: DC
  Administered 2017-12-11 – 2017-12-12 (×2): 400 mg via ORAL
  Filled 2017-12-11 (×2): qty 1

## 2017-12-11 MED ORDER — ALBUTEROL SULFATE (2.5 MG/3ML) 0.083% IN NEBU: 2.5000 mg | INHALATION_SOLUTION | RESPIRATORY_TRACT | Status: DC | PRN

## 2017-12-11 MED ORDER — METRONIDAZOLE IN NACL 5-0.79 MG/ML-% IV SOLN
500.0000 mg | Freq: Three times a day (TID) | INTRAVENOUS | Status: DC
  Administered 2017-12-11 – 2017-12-12 (×4): 500 mg via INTRAVENOUS
  Filled 2017-12-11 (×3): qty 100

## 2017-12-11 MED ORDER — DEXTROSE 5 % IV SOLN
2.0000 g | Freq: Three times a day (TID) | INTRAVENOUS | Status: DC
  Administered 2017-12-11 – 2017-12-12 (×3): 2 g via INTRAVENOUS
  Filled 2017-12-11 (×4): qty 2

## 2017-12-11 MED ORDER — SODIUM CHLORIDE 0.9 % IV SOLN
INTRAVENOUS | Status: DC
  Administered 2017-12-11 (×2): via INTRAVENOUS

## 2017-12-11 MED ORDER — VITAMIN B-12 1000 MCG PO TABS
1000.0000 ug | ORAL_TABLET | Freq: Every day | ORAL | Status: DC
  Administered 2017-12-11 – 2017-12-12 (×2): 1000 ug via ORAL
  Filled 2017-12-11 (×3): qty 1

## 2017-12-11 MED ORDER — HYDROCODONE-ACETAMINOPHEN 7.5-325 MG PO TABS
1.0000 | ORAL_TABLET | Freq: Four times a day (QID) | ORAL | Status: DC | PRN
  Administered 2017-12-11 – 2017-12-13 (×5): 1 via ORAL
  Filled 2017-12-11 (×5): qty 1

## 2017-12-11 MED ORDER — VANCOMYCIN HCL IN DEXTROSE 1-5 GM/200ML-% IV SOLN
1000.0000 mg | Freq: Once | INTRAVENOUS | Status: AC
  Administered 2017-12-11: 1000 mg via INTRAVENOUS
  Filled 2017-12-11: qty 200

## 2017-12-11 NOTE — ED Notes (Signed)
Dr Randal Buba given a copy of chem 8 results

## 2017-12-11 NOTE — ED Notes (Signed)
Nurse will collect labs. 

## 2017-12-11 NOTE — ED Notes (Signed)
Pt taken to CT.

## 2017-12-11 NOTE — ED Notes (Signed)
Attempted report 

## 2017-12-11 NOTE — Consult Note (Signed)
Name: [name] [name] MRN: [id number] DOB: 1956-11-21    ADMISSION DATE:  12/11/2017 CONSULTATION DATE:  12/11/2017  REFERRING MD :  Dr. 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 During stay patient underwent bronchoscopy with BAL x 2 and Transbronchial biopsy, no definitive organism was identified, treatment with vancomycin, cefepime, flagyl, and posaconazole (12/22-2/2) via PICC, with 3% nebs, Aerobika and chest vest therapy     Presents to ED on 1/7 with new onset hemoptysis. Patient states that since discharge on 12/27 she has had copious secretions that are yellow/green in color with streaks of red. However, at 0200 she woke up coughing bright red blood, estimates about a cup, during this time patient felt like she could not breathe so she called EMS. Reports that she has been compliant with medication regimen. Has smoked about 3-4 cigarettes since discharge. Reports that she is a DNR. Is not willing to undergo surgery, however, would be open to bronchoscopy if needed. PCCM asked to consult.   STUDIES:  11/27 CT Chest > AIRWAYS, LUNGS, PLEURA: Emphysema. Right upper lobectomy. Cystic right apical opacity unchanged. Patchy consolidations along the inferior margin of the cystic opacities similar to the prior exam. Interval development of complete opacification of the previously aerated right middle lobe with volume loss. The proximal right middle lobe airways are no longer visualized and are likely occluded. Improved consolidations at the medial right base. No pleural effusion. MEDIASTINUM: Normal heart size. No pericardial effusion. Normal caliber thoracic aorta. No mediastinal lymphadenopathy. IMAGED ABDOMEN: Unremarkable. SOFT TISSUES:  Unremarkable. BONES: Unremarkable.  11/30 > bronchoscopy performed without fistula visualized Respiratory cultures +Aspergillus > The oropharynx appears normal. The larynx appears normal. The vocal cords appear normal. The subglottic space is normal. The trachea is of normal caliber. The carina is sharp. Scattered thick secretions are present throughout the trachea and right tracheobronchial tree. These were cleared with either the T scope or P190 scope. A sample was  obtained for microbiologic culture. The right upper lobectomy stump appears well-healed. The origin of the right middle lobe isextrinsically compressed and barely passable with a P190 scope. Despite extensive searching, even in the presence of vigerous jet ventilation, no orifice was found to suggest a bronchopleural fistula.  12/3 CT Chest > AIRWAYS, LUNGS, PLEURA: Emphysema. Right upper lobectomy and unchanged cystic right apical opacity unchanged. Progressed consolidation of the upper portion of the right lower lobe (series 5, Image 44). Increased right lower lobe patchy/tree-in-bud and groundglass opacities associated with bronchial thickening. Complete opacification of the right middle lobe with volume loss again noted and right middle lobe airway occlusion. No pleural effusion. MEDIASTINUM: Normal heart size. No pericardial effusion. Normal caliber thoracic aorta. No mediastinal lymphadenopathy. IMAGED ABDOMEN: Unremarkable. SOFT TISSUES: Unremarkable. BONES: Unremarkable.  12/13 CT Chest > Short interval increase in patchy consolidations, groundglass opacities, and asymmetric interstitial opacities in the right lung base could reflect worsening multifocal pneumonia, superimposed acute aspiration (particularly given new RLL endobronchial opacification), and/or asymmetric edema. 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 Attention on follow-up. --Stable to slight increase in consolidative opacities in the upper  portion of the right lower lobe. 12/15 > Bronch with BAL >  - Right lower lobe pneumonia  - Tortorous trachea noted, s/p RUL lobectomy with stump -  Mucoid secretions encountered in right mainstem/BI, washed and sent for bacterial/fungal cultures.  - Bronchoalveolar lavage was performed or right middle and lower lobe, combined, sent for cell counts, bacterial/fungal cultures, AFB, Galactomannan, PJP  12/21 CT Chest > New right lower lobe airspace consolidation, concerning for infection. - Cystic lesion in the right superior hemithorax may be secondary to bronchopleural fistula  12/21 > Bronch with endobronchial biopsy > pathology with fibrous stroma with focal lymphohistiocytic infiltrate, possible loose granuloma, GMS and AFB stains negative > - Unresolving right middle lobe infiltrate  - Unresolving right lower lobe infiltrate  - The airway examination of the left lung was normal.  - Distortion was found in the bronchus intermedius.  - Mucosal inflammation was visualized in the posterior basal segment of the right lower lobe (B10).  - Exudate was found in the posterior basal segment of the right lower lobe (B10).  - Bronchoalveolar lavage was performed.  - Transbronchial lung biopsies were performed.  CT Chest 1/7 > 1. No pulmonary embolus. 2. Recent outside hospital CTs are not available for direct comparison, reports were reviewed. Post right upper lobectomy with thick-walled cavity at the right lung apex. The thick-walled component was described on recent outside CT, however has progressed from September 2018. 3. Increased soft tissue density adjacent to suture line in the right lung from September 2018, nonspecific imaging features, may be secondary to infection or atelectasis. 4. Patchy, ground-glass, and consolidative multifocal right lower lobe opacities with small right pleural effusion, suspicious for pneumonia. 5. Patchy ground-glass opacity in the left  lower lobe is nonspecific and may be infectious or inflammatory. 6. Prominent subcarinal node is likely reactive. 7. Aortic Atherosclerosis (ICD10-I70.0) and Emphysema (ICD10-J43.9).  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  has a past surgical history that includes Knee surgery; ovarian cyst removed; Video bronchoscopy (02/15/2012); Mediastinoscopy (05/25/2012); Tonsillectomy; Flexible bronchoscopy (11/05/2012); Thoracotomy (11/05/2012); Lobectomy (11/05/2012); and Esophagogastroduodenoscopy (12/03/2012). Prior to Admission medications   Medication Sig Start Date End Date Taking? Authorizing Provider  acetaminophen (TYLENOL) 500 MG tablet Take 500 mg by mouth every 4 (four) hours as needed for moderate pain or fever. For fever    [provider]  albuterol (PROVENTIL HFA) 108 (90 Base) MCG/ACT inhaler Inhale 2 puffs into the lungs every 4 (four) hours as needed for wheezing or shortness of breath.    [provider]  ALPRAZolam Duanne Moron) 0.25 MG tablet Take 1 tablet (0.25 mg total) by mouth at bedtime as needed for anxiety or sleep. 12/08/17   Plovsky, Berneta Sages, MD  aspirin 81 MG chewable tablet Chew 81 mg by mouth daily.    [provider]  calcium carbonate (TUMS) 500 MG chewable tablet Chew by mouth.    [provider]  Cefepime HCl 2 GM/100ML SOLN Inject into the vein. 11/28/17 01/06/18  [provider]  Cholecalciferol (VITAMIN D PO) Take 1 tablet by mouth daily.    [provider]  HYDROcodone-acetaminophen (NORCO) 7.5-325 MG tablet Take 1 tablet by  mouth every 6 (six) hours as needed for moderate pain. 12/01/17   Pleas Koch, NP  lidocaine (LIDODERM) 5 % Place onto the skin. 11/30/17 12/30/17  [provider]  LYSINE PO Take 1 tablet by mouth daily.  [provider]  Magnesium 400 MG TABS Take 1 tablet by mouth daily.    [provider]  magnesium oxide (MAG-OX) 400 MG tablet Take 400 mg by mouth.    [provider]  methocarbamol (ROBAXIN) 500 MG tablet Take 1 tablet (500 mg total) by mouth every 8 (eight) hours as needed for muscle spasms. Patient taking differently: Take 250 mg by mouth every 8 (eight) hours as needed for muscle spasms.  01/02/15   Hudnall, Sharyn Lull, MD  metroNIDAZOLE (FLAGYL) 500 MG tablet Take 500 mg by mouth. 11/30/17 01/06/18  [provider]  nicotine polacrilex (NICORETTE) 4 MG lozenge Place 4 mg inside cheek every 3 (three) hours as needed for smoking cessation. For nicotine cravings.    [provider]  Ondansetron HCl (ZOFRAN PO) Take 4 mg by mouth as needed.    [provider]  pantoprazole (PROTONIX) 40 MG tablet Take 1 tablet (40 mg total) by mouth 2 (two) times daily before a meal. 07/27/17   Pleas Koch, NP  POSACONAZOLE PO Take 300 mg by mouth daily at 12 noon.    [provider]  Potassium 99 MG TABS Take by mouth.    [provider]  Potassium Gluconate 595 MG CAPS Take 1 capsule by mouth daily.    [provider]  QVAR REDIHALER 80 MCG/ACT AERB Inhale 2 puffs into the lungs 2 (two) times daily. 04/24/17   [provider]  sodium chloride HYPERTONIC 3 % nebulizer solution Inhale 4 mL by nebulization Two (2) times a day. Inhale 4 ml twice daily 10/25/16   [provider]  vancomycin IVPB Inject into the vein.    [provider]  vitamin B-12 (CYANOCOBALAMIN) 100 MCG tablet Take by mouth.    [provider]   Allergies  Allergen Reactions  . Diphenhydramine Hcl Other (See  Comments)    Makes lungs bleed  . Pseudoephedrine Hcl Other (See Comments)    Has Ischemic Colitis. Makes colon bleed  . Alendronate Sodium Other (See Comments)    chest pain  . Antihistamines, Chlorpheniramine-Type Other (See Comments)    "makes lungs bleed"  . Benadryl [Diphenhydramine] Other (See Comments)    "makes lungs bleed" (patient states she can tolerate the newer ones, like Claritin)  . Levofloxacin Anxiety  . Nsaids Other (See Comments)    ischemic colitis  . Other Other (See Comments)    (All anti-inflammatories) ischemic colitis  . Pheniramine Other (See Comments)    "makes lungs bleed"  . Pseudoephedrine Other (See Comments)    Ischemic colitis  . Alendronate Sodium Other (See Comments)    Heart burn  . Guaifenesin Itching  . Robitussin A-C [Guaifenesin-Codeine] Itching and Other (See Comments)    Palms itching    FAMILY HISTORY:  family history includes Cancer in her father; Diabetes in her maternal grandmother, maternal uncle, and mother; Heart disease in her maternal grandfather and mother. SOCIAL HISTORY:  reports that she has quit smoking. Her smoking use included cigarettes. She has a 15.20 pack-year smoking history. she has never used smokeless tobacco. She reports that she drinks alcohol. She reports that she does not use drugs.  REVIEW OF SYSTEMS:   All negative; except for those that are bolded, which indicate positives.  Constitutional: weight loss, weight gain, night sweats, fevers, chills, fatigue, weakness.  HEENT: headaches, sore throat, sneezing, nasal congestion, post nasal drip, difficulty swallowing, tooth/dental problems, visual complaints, visual changes, ear aches. Neuro: difficulty with speech,  weakness, numbness, ataxia. CV:  chest pain, orthopnea, PND, swelling in lower extremities, dizziness, palpitations, syncope.  Resp: cough, hemoptysis, dyspnea, wheezing. GI: heartburn, indigestion, abdominal pain, nausea, vomiting, diarrhea,  constipation, change in bowel habits, loss of appetite, hematemesis, melena, hematochezia.  GU: dysuria, change in color of urine, urgency or frequency, flank pain, hematuria. MSK: joint pain or swelling, decreased range of motion. Psych: change in mood or affect, depression, anxiety, suicidal ideations, homicidal ideations. Skin: rash, itching, bruising.  SUBJECTIVE:   VITAL SIGNS: Pulse Rate:  [71-89] 72 (01/07 0504) Resp:  [16-35] 35 (01/07 0504) BP: (115-129)/(80-84) 120/81 (01/07 0504) SpO2:  [95 %-97 %] 95 % (01/07 0504)  PHYSICAL EXAMINATION: General: Thin, adult female, no distress  Neuro:  Alert, oriented, follows commands HEENT:  Dry MM, Dry blood noted to mouth  Cardiovascular:  RRR, no MRG  Lungs:  Diminished to bases, no wheeze/crackles  Abdomen:  Non-distended, active bowel sounds  Musculoskeletal:  +1 pedal edema  Skin:  Warm, dry, intact   Recent Labs  Lab 12/11/17 0256 12/11/17 0312  NA 140 143  K 2.1* 2.1*  CL 99* 100*  CO2 27  --   BUN 4* <3*  CREATININE 0.76 0.70  GLUCOSE 107* 109*   Recent Labs  Lab 12/11/17 0256 12/11/17 0312  HGB 9.9* 9.9*  HCT 31.1* 29.0*  WBC 9.1  --   PLT 642*  --    Ct Angio Chest Pe W And/or Wo Contrast  Result Date: 12/11/2017 CLINICAL DATA:  Hemoptysis today. Shortness of breath. Recent admission for 1 month at [facility] with pneumonia. EXAM: CT ANGIOGRAPHY CHEST WITH CONTRAST TECHNIQUE: Multidetector CT imaging of the chest was performed using the standard protocol during bolus administration of intravenous contrast. Multiplanar CT image reconstructions and MIPs were obtained to evaluate the vascular anatomy. CONTRAST:  24m ISOVUE-370 IOPAMIDOL (ISOVUE-370) INJECTION 76% COMPARISON:  Chest CT 08/09/2017. Chest CT report from 11/24/2017 at an outside institution, no images available. FINDINGS: Cardiovascular: There are no filling defects within the pulmonary arteries to suggest pulmonary embolus. Thoracic aorta is normal in  caliber with aortic atherosclerosis. Mild right heart dilatation. Minimal coronary artery calcifications. No pericardial fluid. Left upper extremity PICC with tip in the SVC. Mediastinum/Nodes: Increase subcarinal soft tissue density since September 2018 exam measuring 13 mm short axis, previously 10 mm. No left hilar adenopathy. Soft tissue density at the right hilum likely hilar nodes but ill-defined. Lungs/Pleura: Postsurgical change in the right hemithorax post right upper lobectomy. Right apical cavity, increased wall thickening from September 2018 CT. Increased soft tissue density adjacent to sutures in the adjacent right lower lobe. Patchy and ground-glass opacities in the more inferior right lower lobe, confluent dependently with small right pleural effusion. Small right lower lobe paramediastinal nodular opacity of the pleural tail and is favored to be atelectasis/scarring. Minimal patchy/ ground-glass opacity in the left lower lobe. Advanced emphysema. Upper Abdomen: No acute abnormality. Musculoskeletal: Unchanged vertebral body hemangioma within T10, likely smaller hemangiomas at additional levels. No acute osseous abnormality. Postsurgical change in the right ribs. Review of the MIP images confirms the above findings. IMPRESSION: 1. No pulmonary embolus. 2. Recent outside hospital CTs are not available for direct comparison, reports were reviewed. Post right upper lobectomy with thick-walled cavity at the right lung apex. The thick-walled component was described on recent outside CT, however has progressed from September 2018. 3. Increased soft tissue density adjacent to suture line in the right lung from September 2018, nonspecific imaging features, may be secondary  to infection or atelectasis. 4. Patchy, ground-glass, and consolidative multifocal right lower lobe opacities with small right pleural effusion, suspicious for pneumonia. 5. Patchy ground-glass opacity in the left lower lobe is nonspecific  and may be infectious or inflammatory. 6. Prominent subcarinal node is likely reactive. 7. Aortic Atherosclerosis (ICD10-I70.0) and Emphysema (ICD10-J43.9). Electronically Signed   By: Jeb Levering M.D.   On: 12/11/2017 04:25   Dg Chest Portable 1 View  Result Date: 12/11/2017 CLINICAL DATA:  62 y/o F; right-sided chest pain and coughing bright red blood. Increase shortness of breath. EXAM: PORTABLE CHEST 1 VIEW COMPARISON:  09/05/2017 chest radiograph.  08/09/2017 chest CT. FINDINGS: Stable cardiac silhouette given projection and technique. Stable postsurgical changes in the right upper lung zone. Increased opacification of the apical cavity may represent fluid/hemorrhage. Increased reticular opacities of the aerated right lung, likely bronchitic changes. Clear left lung. Stable rightward mediastinal shift from volume loss in right hemithorax. IMPRESSION: Increased attenuation of right apical cavity, possibly fluid/hemorrhage. Bronchitic changes in aerated right lung. Clear left lung. Electronically Signed   By: Kristine Garbe M.D.   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

## 2017-12-11 NOTE — ED Notes (Signed)
Pt requesting to have her protonix dose be ordered, text message sent to admitting MD.

## 2017-12-11 NOTE — Progress Notes (Signed)
[facility] PROGRESS NOTE  AAMORI MCMASTERS  YWV:[id number] DOB: 1956-04-26 DOA: 12/11/2017 PCP: Pleas Koch, NP  Brief Narrative: [name] [name] is a 62 y.o. female with a history of bronchiectasis, bronchopleural fistula, and atypical mycobacterial infection s/p RU lobectomy 2013 who was recently admitted in [facility] for fever with pneumonia and had a protracted stay from 11/27-12/27/2018 during which patient had repeated CTs and had also undergone bronchoscopy because of patient's pneumonia was not clear and eventually was discharged home on vancomycin, cefepime, flagyl, and posaconazole with plans to follow up 12/26/2017. She presented 1/7 afterwaking up at 2am coughing with significant subsequent hemoptysis. She became short of breath and EMS was called and during which patient was found to be hypoxic. In the ED she had no further hemoptysis, though hgb was 9.9. She reported being given IV iron for anemia at [facility] recently. CT chest showed bilateral infiltrative process concerning for pneumonia with cavitary process in the right apex which has progressed from prior scans. Pulmonary critical care was consulted patient has been admitted for further management.  Subjective: No further hemoptysis. No chest pain. She is tearful when recounting the severity of her dyspnea after hemoptysis this morning.   Objective: BP 108/77   Pulse 73   Temp 98.6 F (37 C) (Oral)   Resp (!) 21   LMP 11/05/2004   SpO2 94%   Gen: Pleasant female in no distress Pulm: Nonlabored with scattered rhonchi bilaterally  CV: RRR, no murmur, no JVD, no edema GI: Soft, NT, ND, +BS  Neuro: Alert and oriented. No focal deficits. Ext: Warm, no deformities. LUE PICC in place.  Skin: No rashes, lesions no ulcers  Assessment & Plan: HCAP: PCT reassuringly negative.  - Continue broad antimicrobial coverage w/vancomycin, zosyn, flagyl, posaconazole.  - Cultures of blood and sputum sent - RVP/flu  pending  Bronchiectasis with hemoptysis: Suspect related to pneumonia undergoing treatment. Coags ok, type & screen performed 1/7.  - Continue hypertonic saline and nebs - Chest PT - Appreciate pulmonary medicine consultation - Monitor CBC.  - Will keep NPO except sips pending need for bronchoscopy today. If none planned and no further hemoptysis, would restart diet 1/8.   Iron-deficiency anemia: Treated with IV iron x2 while at [facility] per pt.  - Monitor CBC  Thrombocytosis: Reactive to infection and iron deficiency.  - Noted, will monitor.   Disposition: Admitted to SDU. Consult to palliative care per pt wishes. Pt is DNR    Vance Gather, MD [facility] Pager [phone]  If 7PM-7AM, please contact night-coverage [email or web address] Password [facility] 12/11/2017, 12:49 PM

## 2017-12-11 NOTE — ED Notes (Signed)
Pt's CBG was 90, RN Sarah notified

## 2017-12-11 NOTE — Progress Notes (Signed)
Pharmacy Antibiotic Note  [name] [name] is a 62 y.o. female admitted on 12/11/2017 with pneumonia.  Pharmacy has been consulted for continuation of Vancomycin and Cefepime dosing for polymicrobial PNA. Pt is currently on Vancomycin and Cefepime at home at the following doses: Vancomycin 1250 mg IV q8h, Cefepime 2g IV q8h. She is also on PO Flagyl and posaconazole. Pt was also given an auto-verified dose of vancomycin 1000 mg this AM in the ED (close to the normal due time of her PTA dose).   Plan: -Hold vancomycin, will check a vancomycin trough today at 1300 and enter dosing based on that result -Cefepime 2g IV q8h -Trend WBC, temp, renal function  -F/U infectious work-up -Drug levels as indicated  No data recorded.  Recent Labs  Lab 12/11/17 0256 12/11/17 0312  WBC 9.1  --   CREATININE 0.76 0.70    Estimated Creatinine Clearance: 60.5 mL/min (by C-G formula based on SCr of 0.7 mg/dL).    Allergies  Allergen Reactions  . Diphenhydramine Hcl Other (See Comments)    Makes lungs bleed  . Pseudoephedrine Hcl Other (See Comments)    Has Ischemic Colitis. Makes colon bleed  . Alendronate Sodium Other (See Comments)    chest pain  . Antihistamines, Chlorpheniramine-Type Other (See Comments)    "makes lungs bleed"  . Benadryl [Diphenhydramine] Other (See Comments)    "makes lungs bleed" (patient states she can tolerate the newer ones, like Claritin)  . Levofloxacin Anxiety  . Nsaids Other (See Comments)    ischemic colitis  . Other Other (See Comments)    (All anti-inflammatories) ischemic colitis  . Pheniramine Other (See Comments)    "makes lungs bleed"  . Pseudoephedrine Other (See Comments)    Ischemic colitis  . Alendronate Sodium Other (See Comments)    Heart burn  . Guaifenesin Itching  . Robitussin A-C [Guaifenesin-Codeine] Itching and Other (See Comments)    Palms itching    Narda Bonds 12/11/2017 6:13 AM

## 2017-12-11 NOTE — ED Notes (Signed)
Patient transported to CT 

## 2017-12-11 NOTE — H&P (Signed)
History and Physical    [name] [name] [name] [name] DOB: 03/12/1956 DOA: 12/11/2017  PCP: Pleas Koch, NP  Patient coming from: Home.  Chief Complaint: Hemoptysis.  HPI: [name] [name] is a 62 y.o. female with known history of bronchiectasis, COPD who was recently admitted in [facility] for fever with pneumonia and had a protracted stay from October 31 2017 to November 30 2017 during which patient had repeated CTs and had also undergone bronchoscopy because of patient's pneumonia was not clear and eventually was discharged home on vancomycin cefepime Flagyl and posaconazole and is supposed to follow-up with [facility] on January 22.  This morning around 2 AM when patient woke up patient had some cough and noticed that when patient started producing large amount of hemoptysis which was frankly bloody.  The patient became short of breath and EMS was called and during which patient was found to be hypoxic.  Denies any chest pain or no fevers at this time.  ED Course: In the ER patient's hemoglobin is found to be around 9.9.  Last hemoglobin in our system was 13.8.  Did not know how the hemoglobin was trending at [facility] last month not able to access that data.  Patient had a CT chest done which shows bilateral infiltrative process concerning for pneumonia with cavitary process in the right apex which has progressed.  Pulmonary critical care was consulted patient has been admitted for further management.  Review of Systems: As per HPI, rest all negative.   Past Medical History:  Diagnosis Date  . Arthritis   . B12 deficiency   . Bronchiectasis ([place])   . Cataracts, bilateral   . Colitis    induced by decongestants, NSAIds  . COPD (chronic obstructive pulmonary disease) ([place])    per CT 11/10/05, will minimal bronchiectasis rll  . Endometriosis   . GERD (gastroesophageal reflux disease)   . 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 Hemorrhoid   . Lichen  planus   . 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 Osteoporosis    dexa per gyn  . Pneumothorax, spontaneous, tension    x 2 in her 20's.  has a chest tube for one  . Shoulder dislocation 2015    R shoulder , x 2   . 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 ESOPHAGOGASTRODUODENOSCOPY  12/03/2012   Procedure: ESOPHAGOGASTRODUODENOSCOPY (EGD);  Surgeon: [name], MD;  Location: [facility] ENDOSCOPY;  Service: Endoscopy;  Laterality: N/A;  . FLEXIBLE BRONCHOSCOPY  11/05/2012   Procedure: FLEXIBLE BRONCHOSCOPY;  Surgeon: [name], MD;  Location: [place];  Service: Thoracic;  Laterality: N/A;  . KNEE SURGERY     .not replacement .x8  . LOBECTOMY  11/05/2012   Procedure: LOBECTOMY;  Surgeon: [name], MD;  Location: [facility] OR;  Service: Thoracic;  Laterality: Right;  right upper lobectomy, and part of the right lower lobe  . MEDIASTINOSCOPY  05/25/2012   Procedure: MEDIASTINOSCOPY;  Surgeon: [name], MD;  Location: [facility] OR;  Service: Thoracic;  Laterality: N/A;  . ovarian cyst removed    . THORACOTOMY  11/05/2012   Procedure: THORACOTOMY MAJOR;  Surgeon: [name], MD;  Location: [facility] OR;  Service: Thoracic;  Laterality: Right;  . TONSILLECTOMY    . VIDEO BRONCHOSCOPY  02/15/2012  Procedure: VIDEO BRONCHOSCOPY WITH FLUORO;  Surgeon: [name], MD;  Location: [facility] ENDOSCOPY;  Service: Endoscopy;  Laterality: Bilateral;  WASHING- INTERVENTION BIOPSIES INTERVENTION BRONCHOSCOPY WITH VIDEO     reports that she has quit smoking. Her smoking use included cigarettes. She has a 15.20 pack-year smoking history. she has never used smokeless tobacco. She reports that she drinks alcohol. She reports that she does not use drugs.  Allergies  Allergen Reactions  . Diphenhydramine Hcl Other (See Comments)    Makes lungs bleed  . Pseudoephedrine Hcl Other (See  Comments)    Has Ischemic Colitis. Makes colon bleed  . Alendronate Sodium Other (See Comments)    chest pain  . Antihistamines, Chlorpheniramine-Type Other (See Comments)    "makes lungs bleed"  . Benadryl [Diphenhydramine] Other (See Comments)    "makes lungs bleed" (patient states she can tolerate the newer ones, like Claritin)  . Levofloxacin Anxiety  . Nsaids Other (See Comments)    ischemic colitis  . Other Other (See Comments)    (All anti-inflammatories) ischemic colitis  . Pheniramine Other (See Comments)    "makes lungs bleed"  . Pseudoephedrine Other (See Comments)    Ischemic colitis  . Alendronate Sodium Other (See Comments)    Heart burn  . Guaifenesin Itching  . Robitussin A-C [Guaifenesin-Codeine] Itching and Other (See Comments)    Palms itching    Family History  Problem Relation Age of Onset  . Diabetes Mother        GM  . Heart disease Mother        in her 75s  . Heart disease Maternal Grandfather   . Cancer Father        sarcoma  . Diabetes Maternal Uncle   . Diabetes Maternal Grandmother   . Colon cancer Neg Hx   . Breast cancer Neg Hx   . Esophageal cancer Neg Hx   . Rectal cancer Neg Hx   . Stomach cancer Neg Hx     Prior to Admission medications   Medication Sig Start Date End Date Taking? Authorizing Provider  acetaminophen (TYLENOL) 500 MG tablet Take 500 mg by mouth every 4 (four) hours as needed for moderate pain or fever. For fever   Yes [provider]  albuterol (PROVENTIL HFA) 108 (90 Base) MCG/ACT inhaler Inhale 2 puffs into the lungs every 4 (four) hours as needed for wheezing or shortness of breath.   Yes [provider]  ALPRAZolam (XANAX) 0.25 MG tablet Take 1 tablet (0.25 mg total) by mouth at bedtime as needed for anxiety or sleep. 12/08/17  Yes Plovsky, Berneta Sages, MD  aspirin 81 MG chewable tablet Chew 81 mg by mouth daily.   Yes [provider]  Cefepime HCl 2 GM/100ML SOLN Inject 100 mLs into the vein  every 8 (eight) hours.  11/28/17 01/06/18 Yes [provider]  Cholecalciferol (VITAMIN D PO) Take 1 tablet by mouth daily.   Yes [provider]  HYDROcodone-acetaminophen (NORCO) 7.5-325 MG tablet Take 1 tablet by mouth every 6 (six) hours as needed for moderate pain. 12/01/17  Yes Pleas Koch, NP  lidocaine (LIDODERM) 5 % Place 1 patch onto the skin daily as needed (pain).  11/30/17 12/30/17 Yes [provider]  LYSINE PO Take 1 tablet by mouth daily.   Yes [provider]  Magnesium 400 MG TABS Take 1 tablet by mouth daily.   Yes [provider]  methocarbamol (ROBAXIN) 500 MG tablet Take 1 tablet (500 mg  total) by mouth every 8 (eight) hours as needed for muscle spasms. Patient taking differently: Take 250 mg by mouth every 8 (eight) hours as needed for muscle spasms.  01/02/15  Yes Hudnall, Sharyn Lull, MD  metroNIDAZOLE (FLAGYL) 500 MG tablet Take 500 mg by mouth 3 (three) times daily.  11/30/17 01/06/18 Yes [provider]  nicotine polacrilex (NICORETTE) 4 MG lozenge Place 4 mg inside cheek every 3 (three) hours as needed for smoking cessation. For nicotine cravings.   Yes [provider]  Ondansetron HCl (ZOFRAN PO) Take 4 mg by mouth every 6 (six) hours as needed (nausea).    Yes [provider]  pantoprazole (PROTONIX) 40 MG tablet Take 1 tablet (40 mg total) by mouth 2 (two) times daily before a meal. 07/27/17  Yes Pleas Koch, NP  posaconazole (NOXAFIL) 100 MG TBEC delayed-release tablet Take 300 mg by mouth daily.   Yes [provider]  Potassium Gluconate 595 MG CAPS Take 1 capsule by mouth daily.   Yes [provider]  QVAR REDIHALER 80 MCG/ACT AERB Inhale 2 puffs into the lungs 2 (two) times daily. 04/24/17  Yes [provider]  sodium chloride HYPERTONIC 3 % nebulizer solution Inhale 4 mL by nebulization Two (2) times a day. Inhale 4 ml twice daily 10/25/16  Yes [provider]  vancomycin 1,250 mg in sodium chloride 0.9 % 250 mL Inject 1,250 mg into the vein every 8 (eight) hours.   Yes [provider]  vitamin B-12 (CYANOCOBALAMIN) 100 MCG tablet Take 100 mcg by mouth daily.    Yes [provider]    Physical Exam: Vitals:   12/11/17 0400 12/11/17 0430 12/11/17 0504 12/11/17 0530  BP: 129/80 115/84 120/81 (!) 122/102  Pulse: 89 71 72 76  Resp: (!) 27 (!) 22 (!) 35 20  SpO2: 97% 97% 95% 97%      Constitutional: Moderately built and poorly nourished. Vitals:   12/11/17 0400 12/11/17 0430 12/11/17 0504 12/11/17 0530  BP: 129/80 115/84 120/81 (!) 122/102  Pulse: 89 71 72 76  Resp: (!) 27 (!) 22 (!) 35 20  SpO2: 97% 97% 95% 97%   Eyes: Anicteric no pallor. ENMT: No discharge from the ears eyes nose or mouth. Neck: No mass felt.  No JVD appreciated. Respiratory: No rhonchi or crepitations. Cardiovascular: S1-S2 heard no murmurs appreciated. Abdomen: Soft nontender bowel sounds present. Musculoskeletal: No edema.  No joint effusion. Skin: No rash.  Skin appears warm. Neurologic: Alert awake oriented to time place and person.  Moves all extremities. Psychiatric: Appears normal.  Normal affect.   Labs on Admission: I have personally reviewed following labs and imaging studies  CBC: Recent Labs  Lab 12/11/17 0256 12/11/17 0312  WBC 9.1  --   NEUTROABS 6.3  --   HGB 9.9* 9.9*  HCT 31.1* 29.0*  MCV 98.1  --   PLT 642*  --    Basic Metabolic Panel: Recent Labs  Lab 12/11/17 0256 12/11/17 0312 12/11/17 0316  NA 140 143  --   K 2.1* 2.1*  --   CL 99* 100*  --   CO2 27  --   --   GLUCOSE 107* 109*  --   BUN 4* <3*  --   CREATININE 0.76 0.70  --   CALCIUM 8.0*  --   --   MG  --   --  1.8   GFR: Estimated Creatinine Clearance: 60.5 mL/min (by C-G formula based on SCr  of 0.7 mg/dL). Liver Function Tests: Recent Labs  Lab 12/11/17 0256  AST 28  ALT 10*  ALKPHOS 71  BILITOT 1.0  PROT 6.3*  ALBUMIN 2.4*   No  results for input(s): LIPASE, AMYLASE in the last 168 hours. No results for input(s): AMMONIA in the last 168 hours. Coagulation Profile: Recent Labs  Lab 12/11/17 0256  INR 1.28   Cardiac Enzymes: No results for input(s): CKTOTAL, CKMB, CKMBINDEX, TROPONINI in the last 168 hours. BNP (last 3 results) No results for input(s): PROBNP in the last 8760 hours. HbA1C: No results for input(s): HGBA1C in the last 72 hours. CBG: No results for input(s): GLUCAP in the last 168 hours. Lipid Profile: No results for input(s): CHOL, HDL, LDLCALC, TRIG, CHOLHDL, LDLDIRECT in the last 72 hours. Thyroid Function Tests: No results for input(s): TSH, T4TOTAL, FREET4, T3FREE, THYROIDAB in the last 72 hours. Anemia Panel: No results for input(s): VITAMINB12, FOLATE, FERRITIN, TIBC, IRON, RETICCTPCT in the last 72 hours. Urine analysis:    Component Value Date/Time   COLORURINE YELLOW 08/17/2016 1228   APPEARANCEUR CLOUDY (A) 08/17/2016 1228   LABSPEC 1.010 08/17/2016 1228   PHURINE 7.0 08/17/2016 1228   GLUCOSEU NEGATIVE 08/17/2016 1228   GLUCOSEU NEGATIVE 04/25/2016 1040   HGBUR 1+ (A) 08/17/2016 1228   HGBUR negative 11/25/2008 0830   BILIRUBINUR negative 10/31/2017 0951   KETONESUR TRACE (A) 08/17/2016 1228   PROTEINUR negative 10/31/2017 0951   PROTEINUR NEGATIVE 08/17/2016 1228   UROBILINOGEN 0.2 10/31/2017 0951   UROBILINOGEN 0.2 04/25/2016 1040   NITRITE negative 10/31/2017 0951   NITRITE NEGATIVE 08/17/2016 1228   LEUKOCYTESUR Negative 10/31/2017 0951   Sepsis Labs: _0 (procalcitonin:4,lacticidven:4) )No results found for this or any previous visit (from the past 240 hour(s)).   Radiological Exams on Admission: Ct Angio Chest Pe W And/or Wo Contrast  Result Date: 12/11/2017 CLINICAL DATA:  Hemoptysis today. Shortness of breath. Recent admission for 1 month at [facility] with pneumonia. EXAM: CT ANGIOGRAPHY CHEST WITH CONTRAST TECHNIQUE: Multidetector CT imaging of the chest was  performed using the standard protocol during bolus administration of intravenous contrast. Multiplanar CT image reconstructions and MIPs were obtained to evaluate the vascular anatomy. CONTRAST:  74m ISOVUE-370 IOPAMIDOL (ISOVUE-370) INJECTION 76% COMPARISON:  Chest CT 08/09/2017. Chest CT report from 11/24/2017 at an outside institution, no images available. FINDINGS: Cardiovascular: There are no filling defects within the pulmonary arteries to suggest pulmonary embolus. Thoracic aorta is normal in caliber with aortic atherosclerosis. Mild right heart dilatation. Minimal coronary artery calcifications. No pericardial fluid. Left upper extremity PICC with tip in the SVC. Mediastinum/Nodes: Increase subcarinal soft tissue density since September 2018 exam measuring 13 mm short axis, previously 10 mm. No left hilar adenopathy. Soft tissue density at the right hilum likely hilar nodes but ill-defined. Lungs/Pleura: Postsurgical change in the right hemithorax post right upper lobectomy. Right apical cavity, increased wall thickening from September 2018 CT. Increased soft tissue density adjacent to sutures in the adjacent right lower lobe. Patchy and ground-glass opacities in the more inferior right lower lobe, confluent dependently with small right pleural effusion. Small right lower lobe paramediastinal nodular opacity of the pleural tail and is favored to be atelectasis/scarring. Minimal patchy/ ground-glass opacity in the left lower lobe. Advanced emphysema. Upper Abdomen: No acute abnormality. Musculoskeletal: Unchanged vertebral body hemangioma within T10, likely smaller hemangiomas at additional levels. No acute osseous abnormality. Postsurgical change in the right ribs. Review of the MIP images confirms the above findings. IMPRESSION: 1. No pulmonary embolus.  2. Recent outside hospital CTs are not available for direct comparison, reports were reviewed. Post right upper lobectomy with thick-walled cavity at the  right lung apex. The thick-walled component was described on recent outside CT, however has progressed from September 2018. 3. Increased soft tissue density adjacent to suture line in the right lung from September 2018, nonspecific imaging features, may be secondary to infection or atelectasis. 4. Patchy, ground-glass, and consolidative multifocal right lower lobe opacities with small right pleural effusion, suspicious for pneumonia. 5. Patchy ground-glass opacity in the left lower lobe is nonspecific and may be infectious or inflammatory. 6. Prominent subcarinal node is likely reactive. 7. Aortic Atherosclerosis (ICD10-I70.0) and Emphysema (ICD10-J43.9). Electronically Signed   By: Jeb Levering M.D.   On: 12/11/2017 04:25   Dg Chest Portable 1 View  Result Date: 12/11/2017 CLINICAL DATA:  62 y/o F; right-sided chest pain and coughing bright red blood. Increase shortness of breath. EXAM: PORTABLE CHEST 1 VIEW COMPARISON:  09/05/2017 chest radiograph.  08/09/2017 chest CT. FINDINGS: Stable cardiac silhouette given projection and technique. Stable postsurgical changes in the right upper lung zone. Increased opacification of the apical cavity may represent fluid/hemorrhage. Increased reticular opacities of the aerated right lung, likely bronchitic changes. Clear left lung. Stable rightward mediastinal shift from volume loss in right hemithorax. IMPRESSION: Increased attenuation of right apical cavity, possibly fluid/hemorrhage. Bronchitic changes in aerated right lung. Clear left lung. Electronically Signed   By: Kristine Garbe M.D.   On: 12/11/2017 03:05    EKG: Independently reviewed.  Normal sinus rhythm.  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 Hemoptysis -patient will be kept n.p.o. until pulmonary critical  care consult obtained and possibly may need bronchoscopy if continues to have hemoptysis.  Patient does take aspirin which will be held.  Follow CBC. 2. Healthcare associated pneumonia -patient has been on extensive duration of antibiotics which will be continued for now which includes vancomycin and cefepime Flagyl and posaconazole. 3. Anemia likely from blood loss -will follow CBC.  If hemoglobin less than 7 will transfuse. 4. History of Mycobacterium kansasii infection previously in 2013 had undergone lobectomy -recent labs done at [facility] were negative for cultures. 5. Hypokalemia and hypomagnesemia -probably from poor oral intake replace and recheck. 6. 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  Galactomannan was negative.   DVT prophylaxis: SCDs. Code Status: DNR. Family Communication: Discussed with patient. Disposition Plan: To be determined. Consults called: Pulmonary critical care. Admission status: Inpatient.   Rise Patience MD [facility] Pager [phone].  If 7PM-7AM, please contact night-coverage [email or web address] Password [email or web address]  12/11/2017, 6:02 AM

## 2017-12-11 NOTE — ED Triage Notes (Signed)
Pt from home by EMS c/o  R sided chest pain and coughing up large amounts of bright red blood with clots tonight and increased SOB with difficulty taking a deep breath. Pain to R chest radiating into back. Initial sats 70-80%. Pt has hx of R sided pneumectomy 5 years ago by [facility], has pulmonologist at [facility]. Has been diagnosed with pneumonia and spent a month at [facility] in November.  PICC line present to L arm

## 2017-12-11 NOTE — ED Notes (Signed)
Sent label to main lab to add on procalcitonin.

## 2017-12-11 NOTE — ED Provider Notes (Signed)
[facility] EMERGENCY DEPARTMENT Provider Note   CSN: [id number] Arrival date & time: 12/11/17  0226     History   Chief Complaint Chief Complaint  Patient presents with  . Hemoptysis    HPI [name] [name] is a 62 y.o. female.  The history is provided by the patient.  Chest Pain   This is a new problem. The current episode started 1 to 2 hours ago. The problem occurs constantly. The problem has not changed since onset.The pain is associated with coughing (right sided pain with coughing up blood and clots ). The pain is present in the lateral region. The pain is moderate. The pain does not radiate. Associated symptoms include cough, hemoptysis and shortness of breath. Pertinent negatives include no abdominal pain, no diaphoresis, no fever and no palpitations. She has tried nothing for the symptoms. The treatment provided no relief. Risk factors include smoking/tobacco exposure.  Pertinent negatives for past medical history include no aneurysm.  Pertinent negatives for family medical history include: no Marfan's syndrome.  Procedure history is negative for EPS study.  Discharge from [facility] following a prolonged hospital stay on 6 week course of broad spectrum antibiotics including vancomycin, cefepime, flagyl, and posaconazole (12/22-2/2).     Past Medical History:  Diagnosis Date  . Arthritis   . B12 deficiency   . Bronchiectasis ([place])   . Cataracts, bilateral   . Colitis    induced by decongestants, NSAIds  . COPD (chronic obstructive pulmonary disease) ([place])    per CT 11/10/05, will minimal bronchiectasis rll  . Endometriosis   . GERD (gastroesophageal reflux disease)   . 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 Hemorrhoid   . Lichen planus   . 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 Osteoporosis    dexa per gyn  . Pneumothorax, spontaneous, tension    x 2 in her 20's.  has a chest  tube for one  . Shoulder dislocation 2015    R shoulder , x 2   . 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 Encounter for chronic pain management 10/31/2017  . 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 PCP NOTES >>>>>>>>>>>>>>>>>>>>>>>> 11/24/2015  . Subclavian vein obstruction (HCC)   . Obstruction of vein 09/25/2015  . Arm swelling ---R--- 07/20/2015  . Upper back pain 03/31/2015  . Right shoulder injury 01/06/2015  . Prediabetes 04/02/2014  . 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 Avulsion fracture of trochanter of femur ([place]) 10/17/2013  . Anxiety 10/17/2013  . Intertrochanteric fracture of femur ([place]) 10/17/2013  . Other dysphagia 12/03/2012  . Odynophagia 11/28/2012  . 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 S/P lobectomy of lung 11/18/2012  . History of surgical procedure 11/18/2012  . Weight loss 12/13/2011  . 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 Insomnia 11/22/2010  . PALPITATIONS 02/27/2009  . CIGARETTE SMOKER 12/24/2007  . BRONCHIECTASIS 12/24/2007  . HEPATIC CYST 12/24/2007  . HEADACHES, HX OF 12/24/2007  . Vascular insufficiency of intestine ([place]) 12/24/2007  . B12 DEFICIENCY 12/30/2006  . ASTHMA 12/30/2006  . COPD GOLD 0  12/30/2006  . GERD 12/30/2006  . ENDOMETRIOSIS NOS 12/30/2006  . LICHEN PLANUS 08/65/7846  . Chronic pain 12/30/2006  . Osteoporosis 12/30/2006    Past Surgical History:  Procedure Laterality Date  . ESOPHAGOGASTRODUODENOSCOPY  12/03/2012   Procedure: ESOPHAGOGASTRODUODENOSCOPY (EGD);  Surgeon: [name], MD;  Location: [facility] ENDOSCOPY;  Service: Endoscopy;  Laterality: N/A;  . FLEXIBLE BRONCHOSCOPY  11/05/2012   Procedure: FLEXIBLE BRONCHOSCOPY;  Surgeon: [name], MD;  Location: [facility];  Service: Thoracic;  Laterality: N/A;  . KNEE SURGERY     .not replacement .x8  . LOBECTOMY  11/05/2012   Procedure: LOBECTOMY;  Surgeon: [name], MD;  Location: [facility]  OR;  Service: Thoracic;  Laterality: Right;  right upper lobectomy, and part of the right lower lobe  . MEDIASTINOSCOPY  05/25/2012   Procedure: MEDIASTINOSCOPY;  Surgeon: [name], MD;  Location[facility] OR;  Service: Thoracic;  Laterality: N/A;  . ovarian cyst removed    . THORACOTOMY  11/05/2012   Procedure: THORACOTOMY MAJOR;  Surgeon: [name], MD;  Location: [facility] OR;  Service: Thoracic;  Laterality: Right;  . TONSILLECTOMY    . VIDEO BRONCHOSCOPY  02/15/2012   Procedure: VIDEO BRONCHOSCOPY WITH FLUORO;  Surgeon: [name], MD;  Location: [facility] ENDOSCOPY;  Service: Endoscopy;  Laterality: Bilateral;  WASHING- INTERVENTION BIOPSIES INTERVENTION BRONCHOSCOPY WITH VIDEO    OB History    No data available       Home Medications    Prior to Admission medications   Medication Sig Start Date End Date Taking? Authorizing Provider  acetaminophen (TYLENOL) 500 MG tablet Take 500 mg by mouth every 4 (four) hours as needed for moderate pain or fever. For fever    [provider]  albuterol (PROVENTIL HFA) 108 (90 Base) MCG/ACT inhaler Inhale 2 puffs into the lungs every 4 (four) hours as needed for wheezing or shortness of breath.    [provider]  ALPRAZolam Duanne Moron) 0.25 MG tablet Take 1 tablet (0.25 mg total) by mouth at bedtime as needed for anxiety or sleep. 12/08/17   Plovsky, Berneta Sages, MD  aspirin 81 MG chewable tablet Chew 81 mg by mouth daily.    [provider]  calcium carbonate (TUMS) 500 MG chewable tablet Chew by mouth.    [provider]  Cefepime HCl 2 GM/100ML SOLN Inject into the vein. 11/28/17 01/06/18  [provider]  Cholecalciferol (VITAMIN D PO) Take 1 tablet by mouth daily.    [provider]  HYDROcodone-acetaminophen (NORCO) 7.5-325 MG tablet Take 1 tablet by mouth every 6 (six) hours as needed for moderate pain. 12/01/17   Pleas Koch, NP  HYDROcodone-acetaminophen (NORCO) 7.5-325 MG tablet Take 1 tablet by  mouth every 6 (six) hours as needed for moderate pain. 12/01/17   Pleas Koch, NP  lidocaine (LIDODERM) 5 % Place onto the skin. 11/30/17 12/30/17  [provider]  LYSINE PO Take 1 tablet by mouth daily.    [provider]  Magnesium 400 MG TABS Take 1 tablet by mouth daily.    [provider]  magnesium oxide (MAG-OX) 400 MG tablet Take 400 mg by mouth.    [provider]  methocarbamol (ROBAXIN) 500 MG tablet Take 1 tablet (500 mg total) by mouth every 8 (eight) hours as needed for muscle spasms. Patient taking differently: Take 250 mg by mouth every 8 (eight) hours as needed for muscle spasms.  01/02/15   Hudnall, Sharyn Lull, MD  metroNIDAZOLE (FLAGYL) 500 MG tablet Take 500 mg by mouth. 11/30/17 01/06/18  [provider]  nicotine polacrilex (NICORETTE) 4 MG lozenge Place 4 mg inside cheek every 3 (three) hours as needed for smoking cessation. For nicotine cravings.    [provider]  Ondansetron HCl (ZOFRAN PO) Take 4 mg by mouth as needed.    [provider]  pantoprazole (PROTONIX) 40 MG tablet Take 1 tablet (40 mg total) by mouth 2 (two) times daily before a meal. 07/27/17   Pleas Koch, NP  POSACONAZOLE PO Take 300 mg by mouth daily at 12 noon.    [provider]  Potassium 99 MG TABS Take by mouth.    [provider]  Potassium Gluconate 595 MG CAPS Take 1 capsule by mouth daily.    [provider]  QVAR REDIHALER 80 MCG/ACT AERB Inhale 2 puffs into the lungs 2 (two) times daily. 04/24/17   [provider]  sodium chloride HYPERTONIC 3 % nebulizer solution Inhale 4 mL by nebulization Two (2) times a day. Inhale 4 ml twice daily 10/25/16   [provider]  vancomycin IVPB Inject into the vein.    [provider]  vitamin B-12 (CYANOCOBALAMIN) 100 MCG tablet Take by mouth.    [provider]    Family History Family History  Problem Relation Age of Onset    . Diabetes Mother        GM  . Heart disease Mother        in her 70s  . Heart disease Maternal Grandfather   . Cancer Father        sarcoma  . Diabetes Maternal Uncle   . Diabetes Maternal Grandmother   . Colon cancer Neg Hx   . Breast cancer Neg Hx   . Esophageal cancer Neg Hx   . Rectal cancer Neg Hx   . Stomach cancer Neg Hx     Social History Social History   Tobacco Use  . Smoking status: Former Smoker    Packs/day: 0.40    Years: 38.00    Pack years: 15.20    Types: Cigarettes  . Smokeless tobacco: Never Used  . Tobacco comment: stopped due to illness  Substance Use Topics  . Alcohol use: Yes    Alcohol/week: 0.0 oz  . Drug use: No     Allergies   Diphenhydramine hcl; Pseudoephedrine hcl; Alendronate sodium; Antihistamines, chlorpheniramine-type; Benadryl [diphenhydramine]; Levofloxacin; Nsaids; Other; Pheniramine; Pseudoephedrine; Alendronate sodium; Guaifenesin; and Robitussin a-c [guaifenesin-codeine]   Review of Systems Review of Systems  Constitutional: Negative for diaphoresis and fever.  Respiratory: Positive for cough, hemoptysis and shortness of breath.   Cardiovascular: Positive for chest pain. Negative for palpitations and leg swelling.  Gastrointestinal: Negative for abdominal pain.  Genitourinary: Negative for flank pain.  All other systems reviewed and are negative.    Physical Exam Updated Vital Signs BP 128/83   Pulse 71   Resp 16   LMP 11/05/2004   SpO2 95%   Physical Exam  Constitutional: She is oriented to person, place, and time. She appears well-developed and well-nourished. No distress.  HENT:  Head: Normocephalic and atraumatic.  Mouth/Throat: No oropharyngeal exudate.  Eyes: Conjunctivae are normal. Pupils are equal, round, and reactive to light.  Neck: Normal range of motion. Neck supple. No JVD present.  Cardiovascular: Normal rate, regular rhythm, normal heart sounds and intact distal pulses.  Pulmonary/Chest: No  stridor. She has decreased breath sounds. She has no wheezes.  Abdominal: Soft. Bowel sounds are normal. She exhibits no mass. There is no tenderness. There is no rebound and no guarding.  Musculoskeletal: Normal range of motion.  Lymphadenopathy:    She has no cervical adenopathy.  Neurological: She is alert and oriented to  person, place, and time. She displays normal reflexes.  Skin: Skin is warm and dry. Capillary refill takes less than 2 seconds.  Psychiatric: She has a normal mood and affect.     ED Treatments / Results  Labs (all labs ordered are listed, but only abnormal results are displayed)  Results for orders placed or performed during the hospital encounter of 12/11/17  CBC with Differential/Platelet  Result Value Ref Range   WBC 9.1 4.0 - 10.5 K/uL   RBC 3.17 (L) 3.87 - 5.11 MIL/uL   Hemoglobin 9.9 (L) 12.0 - 15.0 g/dL   HCT 31.1 (L) 36.0 - 46.0 %   MCV 98.1 78.0 - 100.0 fL   MCH 31.2 26.0 - 34.0 pg   MCHC 31.8 30.0 - 36.0 g/dL   RDW 17.6 (H) 11.5 - 15.5 %   Platelets 642 (H) 150 - 400 K/uL   Neutrophils Relative % 70 %   Neutro Abs 6.3 1.7 - 7.7 K/uL   Lymphocytes Relative 13 %   Lymphs Abs 1.2 0.7 - 4.0 K/uL   Monocytes Relative 12 %   Monocytes Absolute 1.1 (H) 0.1 - 1.0 K/uL   Eosinophils Relative 4 %   Eosinophils Absolute 0.4 0.0 - 0.7 K/uL   Basophils Relative 1 %   Basophils Absolute 0.1 0.0 - 0.1 K/uL  Comprehensive metabolic panel  Result Value Ref Range   Sodium 140 135 - 145 mmol/L   Potassium 2.1 (LL) 3.5 - 5.1 mmol/L   Chloride 99 (L) 101 - 111 mmol/L   CO2 27 22 - 32 mmol/L   Glucose, Bld 107 (H) 65 - 99 mg/dL   BUN 4 (L) 6 - 20 mg/dL   Creatinine, Ser 0.76 0.44 - 1.00 mg/dL   Calcium 8.0 (L) 8.9 - 10.3 mg/dL   Total Protein 6.3 (L) 6.5 - 8.1 g/dL   Albumin 2.4 (L) 3.5 - 5.0 g/dL   AST 28 15 - 41 U/L   ALT 10 (L) 14 - 54 U/L   Alkaline Phosphatase 71 38 - 126 U/L   Total Bilirubin 1.0 0.3 - 1.2 mg/dL   GFR calc non Af Amer >60 >60  mL/min   GFR calc Af Amer >60 >60 mL/min   Anion gap 14 5 - 15  Protime-INR  Result Value Ref Range   Prothrombin Time 15.9 (H) 11.4 - 15.2 seconds   INR 1.28   Magnesium  Result Value Ref Range   Magnesium 1.8 1.7 - 2.4 mg/dL  I-Stat Chem 8, ED  Result Value Ref Range   Sodium 143 135 - 145 mmol/L   Potassium 2.1 (LL) 3.5 - 5.1 mmol/L   Chloride 100 (L) 101 - 111 mmol/L   BUN <3 (L) 6 - 20 mg/dL   Creatinine, Ser 0.70 0.44 - 1.00 mg/dL   Glucose, Bld 109 (H) 65 - 99 mg/dL   Calcium, Ion 0.98 (L) 1.15 - 1.40 mmol/L   TCO2 32 22 - 32 mmol/L   Hemoglobin 9.9 (L) 12.0 - 15.0 g/dL   HCT 29.0 (L) 36.0 - 46.0 %   Comment NOTIFIED PHYSICIAN   I-stat troponin, ED  Result Value Ref Range   Troponin i, poc 0.01 0.00 - 0.08 ng/mL   Comment 3          Type and screen  Result Value Ref Range   ABO/RH(D) A POS    Antibody Screen NEG    Sample Expiration 12/14/2017    Ct Angio Chest Pe W And/or Wo  Contrast  Result Date: 12/11/2017 CLINICAL DATA:  Hemoptysis today. Shortness of breath. Recent admission for 1 month at [facility] with pneumonia. EXAM: CT ANGIOGRAPHY CHEST WITH CONTRAST TECHNIQUE: Multidetector CT imaging of the chest was performed using the standard protocol during bolus administration of intravenous contrast. Multiplanar CT image reconstructions and MIPs were obtained to evaluate the vascular anatomy. CONTRAST:  32m ISOVUE-370 IOPAMIDOL (ISOVUE-370) INJECTION 76% COMPARISON:  Chest CT 08/09/2017. Chest CT report from 11/24/2017 at an outside institution, no images available. FINDINGS: Cardiovascular: There are no filling defects within the pulmonary arteries to suggest pulmonary embolus. Thoracic aorta is normal in caliber with aortic atherosclerosis. Mild right heart dilatation. Minimal coronary artery calcifications. No pericardial fluid. Left upper extremity PICC with tip in the SVC. Mediastinum/Nodes: Increase subcarinal soft tissue density since September 2018 exam measuring 13 mm  short axis, previously 10 mm. No left hilar adenopathy. Soft tissue density at the right hilum likely hilar nodes but ill-defined. Lungs/Pleura: Postsurgical change in the right hemithorax post right upper lobectomy. Right apical cavity, increased wall thickening from September 2018 CT. Increased soft tissue density adjacent to sutures in the adjacent right lower lobe. Patchy and ground-glass opacities in the more inferior right lower lobe, confluent dependently with small right pleural effusion. Small right lower lobe paramediastinal nodular opacity of the pleural tail and is favored to be atelectasis/scarring. Minimal patchy/ ground-glass opacity in the left lower lobe. Advanced emphysema. Upper Abdomen: No acute abnormality. Musculoskeletal: Unchanged vertebral body hemangioma within T10, likely smaller hemangiomas at additional levels. No acute osseous abnormality. Postsurgical change in the right ribs. Review of the MIP images confirms the above findings. IMPRESSION: 1. No pulmonary embolus. 2. Recent outside hospital CTs are not available for direct comparison, reports were reviewed. Post right upper lobectomy with thick-walled cavity at the right lung apex. The thick-walled component was described on recent outside CT, however has progressed from September 2018. 3. Increased soft tissue density adjacent to suture line in the right lung from September 2018, nonspecific imaging features, may be secondary to infection or atelectasis. 4. Patchy, ground-glass, and consolidative multifocal right lower lobe opacities with small right pleural effusion, suspicious for pneumonia. 5. Patchy ground-glass opacity in the left lower lobe is nonspecific and may be infectious or inflammatory. 6. Prominent subcarinal node is likely reactive. 7. Aortic Atherosclerosis (ICD10-I70.0) and Emphysema (ICD10-J43.9). Electronically Signed   By: MJeb LeveringM.D.   On: 12/11/2017 04:25   Dg Chest Portable 1 View  Result Date:  12/11/2017 CLINICAL DATA:  62y/o F; right-sided chest pain and coughing bright red blood. Increase shortness of breath. EXAM: PORTABLE CHEST 1 VIEW COMPARISON:  09/05/2017 chest radiograph.  08/09/2017 chest CT. FINDINGS: Stable cardiac silhouette given projection and technique. Stable postsurgical changes in the right upper lung zone. Increased opacification of the apical cavity may represent fluid/hemorrhage. Increased reticular opacities of the aerated right lung, likely bronchitic changes. Clear left lung. Stable rightward mediastinal shift from volume loss in right hemithorax. IMPRESSION: Increased attenuation of right apical cavity, possibly fluid/hemorrhage. Bronchitic changes in aerated right lung. Clear left lung. Electronically Signed   By: LKristine GarbeM.D.   On: 12/11/2017 03:05    EKG   EKG Interpretation  Date/Time:  Monday December 11 2017 02:31:55 EST Ventricular Rate:  79 PR Interval:    QRS Duration: 87 QT Interval:  403 QTC Calculation: 462 R Axis:   33 Text Interpretation:  Sinus rhythm Nonspecific repol abnormality, diffuse leads unchanged Confirmed by PRandal Buba Katrese Shell ([id number]) on  12/11/2017 2:55:16 AM        Procedures Procedures (including critical care time)  Medications Ordered in ED  Medications  potassium chloride 10 mEq in 100 mL IVPB (10 mEq Intravenous New Bag/Given 12/11/17 0415)  vancomycin (VANCOCIN) IVPB 1000 mg/200 mL premix (not administered)  piperacillin-tazobactam (ZOSYN) IVPB 3.375 g (not administered)  iopamidol (ISOVUE-370) 76 % injection (80 mLs  Contrast Given 12/11/17 0345)  magnesium sulfate IVPB 2 g 50 mL (2 g Intravenous New Bag/Given 12/11/17 0417)   MDM Reviewed: previous chart, nursing note and vitals Reviewed previous: labs Interpretation: labs, x-ray, CT scan and ECG (hypokalemia at 2.1 hypomagnesemia .98 haziness in the right chest by me on CXR,, no PE on Ct by me) Consults: critical care and admitting MD (case d/w Dr. Madalyn Rob  PCCM to see patient )   CRITICAL CARE Performed by: Carlisle Beers Total critical care time: 91 minutes Critical care time was exclusive of separately billable procedures and treating other patients. Critical care was necessary to treat or prevent imminent or life-threatening deterioration. Critical care was time spent personally by me on the following activities: development of treatment plan with patient and/or surrogate as well as nursing, discussions with consultants, evaluation of patient's response to treatment, examination of patient, obtaining history from patient or surrogate, ordering and performing treatments and interventions, ordering and review of laboratory studies, ordering and review of radiographic studies, pulse oximetry and re-evaluation of patient's condition.   Final Clinical Impressions(s) / ED Diagnoses   Hemoptysis PNA Hypokalemia  hypomagnesemia   Makinsley Schiavi, MD 12/11/17 [id number]

## 2017-12-11 NOTE — Progress Notes (Signed)
No charge note.  Palliative Medicine meeting is scheduled for 10:30 on 12/12/17  Florentina Jenny, PA-C Palliative Medicine Pager: [phone]

## 2017-12-12 ENCOUNTER — Other Ambulatory Visit: Payer: Self-pay

## 2017-12-12 ENCOUNTER — Telehealth: Payer: Self-pay

## 2017-12-12 DIAGNOSIS — Z515 Encounter for palliative care: Secondary | ICD-10-CM

## 2017-12-12 DIAGNOSIS — A310 Pulmonary mycobacterial infection: Secondary | ICD-10-CM

## 2017-12-12 DIAGNOSIS — Z7189 Other specified counseling: Secondary | ICD-10-CM

## 2017-12-12 LAB — BASIC METABOLIC PANEL
ANION GAP: 13 (ref 5–15)
ANION GAP: 12 (ref 5–15)
BUN: <5 mg/dL — ABNORMAL LOW (ref 6–20)
CALCIUM: 7.6 mg/dL — AB (ref 8.9–10.3)
CHLORIDE: 96 mmol/L — AB (ref 101–111)
CO2: 28 mmol/L (ref 22–32)
CO2: 26 mmol/L (ref 22–32)
Calcium: 7.5 mg/dL — ABNORMAL LOW (ref 8.9–10.3)
Chloride: 98 mmol/L — ABNORMAL LOW (ref 101–111)
Creatinine, Ser: 0.71 mg/dL (ref 0.44–1.00)
Creatinine, Ser: 0.77 mg/dL (ref 0.44–1.00)
GFR calc non Af Amer: >60 mL/min (ref >60)
Glucose, Bld: 91 mg/dL (ref 65–99)
Glucose, Bld: 151 mg/dL — ABNORMAL HIGH (ref 65–99)
POTASSIUM: 2.1 mmol/L — AB (ref 3.5–5.1)
SODIUM: 136 mmol/L (ref 135–145)
Sodium: 137 mmol/L (ref 135–145)

## 2017-12-12 LAB — MAGNESIUM: MAGNESIUM: 1.7 mg/dL (ref 1.7–2.4)

## 2017-12-12 LAB — CBC
HEMATOCRIT: 27.7 % — AB (ref 36.0–46.0)
HEMOGLOBIN: 8.9 g/dL — AB (ref 12.0–15.0)
MCH: 31.7 pg (ref 26.0–34.0)
MCHC: 32.1 g/dL (ref 30.0–36.0)
MCV: 98.6 fL (ref 78.0–100.0)
Platelets: 545 10*3/uL — ABNORMAL HIGH (ref 150–400)
RBC: 2.81 MIL/uL — AB (ref 3.87–5.11)
RDW: 18.1 % — ABNORMAL HIGH (ref 11.5–15.5)
WBC: 9.6 10*3/uL (ref 4.0–10.5)

## 2017-12-12 LAB — GLUCOSE, CAPILLARY: GLUCOSE-CAPILLARY: 80 mg/dL (ref 65–99)

## 2017-12-12 LAB — MRSA PCR SCREENING: MRSA by PCR: NEGATIVE

## 2017-12-12 MED ORDER — SENNA 8.6 MG PO TABS
2.0000 | ORAL_TABLET | Freq: Every day | ORAL | Status: DC
  Administered 2017-12-12: 17.2 mg via ORAL
  Filled 2017-12-12: qty 2

## 2017-12-12 MED ORDER — HALOPERIDOL LACTATE 2 MG/ML PO CONC: 2.0000 mg | Freq: Three times a day (TID) | ORAL | 0 refills | Status: AC | PRN

## 2017-12-12 MED ORDER — POTASSIUM CHLORIDE CRYS ER 20 MEQ PO TBCR
40.0000 meq | EXTENDED_RELEASE_TABLET | Freq: Three times a day (TID) | ORAL | Status: AC
  Administered 2017-12-12 – 2017-12-13 (×3): 40 meq via ORAL
  Filled 2017-12-12 (×3): qty 2

## 2017-12-12 MED ORDER — MAGNESIUM OXIDE 400 (241.3 MG) MG PO TABS
400.0000 mg | ORAL_TABLET | Freq: Two times a day (BID) | ORAL | Status: DC
  Administered 2017-12-12 – 2017-12-13 (×2): 400 mg via ORAL
  Filled 2017-12-12 (×2): qty 1

## 2017-12-12 MED ORDER — HALOPERIDOL LACTATE 2 MG/ML PO CONC
2.0000 mg | Freq: Three times a day (TID) | ORAL | Status: DC | PRN
  Filled 2017-12-12: qty 1

## 2017-12-12 MED ORDER — MORPHINE SULFATE (CONCENTRATE) 10 MG/0.5ML PO SOLN: 5.0000 mg | ORAL | 0 refills | Status: AC | PRN

## 2017-12-12 MED ORDER — ALPRAZOLAM 0.25 MG PO TABS
0.2500 mg | ORAL_TABLET | Freq: Three times a day (TID) | ORAL | Status: DC | PRN
  Administered 2017-12-12 – 2017-12-13 (×3): 0.25 mg via ORAL
  Filled 2017-12-12 (×3): qty 1

## 2017-12-12 MED ORDER — MORPHINE SULFATE (CONCENTRATE) 10 MG/0.5ML PO SOLN
5.0000 mg | ORAL | Status: DC | PRN
  Administered 2017-12-12 – 2017-12-13 (×11): 5 mg via ORAL
  Filled 2017-12-12 (×11): qty 0.5

## 2017-12-12 MED ORDER — POTASSIUM CHLORIDE 10 MEQ/100ML IV SOLN
10.0000 meq | INTRAVENOUS | Status: AC
Start: 2017-12-12 — End: 2017-12-12
  Administered 2017-12-12 (×4): 10 meq via INTRAVENOUS
  Filled 2017-12-12 (×4): qty 100

## 2017-12-12 MED ORDER — ORAL CARE MOUTH RINSE
15.0000 mL | Freq: Two times a day (BID) | OROMUCOSAL | Status: DC
  Administered 2017-12-12 – 2017-12-13 (×2): 15 mL via OROMUCOSAL

## 2017-12-12 MED ORDER — POTASSIUM CHLORIDE CRYS ER 20 MEQ PO TBCR
40.0000 meq | EXTENDED_RELEASE_TABLET | Freq: Once | ORAL | Status: AC
  Administered 2017-12-12: 40 meq via ORAL
  Filled 2017-12-12: qty 2

## 2017-12-12 MED ORDER — CHLORHEXIDINE GLUCONATE CLOTH 2 % EX PADS
6.0000 | MEDICATED_PAD | Freq: Every day | CUTANEOUS | Status: DC
  Administered 2017-12-13: 6 via TOPICAL

## 2017-12-12 MED ORDER — POTASSIUM CHLORIDE IN NACL 40-0.9 MEQ/L-% IV SOLN
INTRAVENOUS | Status: DC
  Administered 2017-12-12 – 2017-12-13 (×2): 90 mL/h via INTRAVENOUS
  Filled 2017-12-12 (×2): qty 1000

## 2017-12-12 MED ORDER — ALPRAZOLAM 0.25 MG PO TABS: 0.2500 mg | ORAL_TABLET | Freq: Three times a day (TID) | ORAL | 0 refills | Status: AC | PRN

## 2017-12-12 NOTE — Telephone Encounter (Signed)
Copied from [place] [phone]. Topic: Inquiry >> Dec 12, 2017  4:16 PM Oliver Pila B wrote: Reason for CRM: pt is being released from [facility] tomorrow and is wanting Alma Friendly to be the "attending of record", contact Evette @ [phone]

## 2017-12-12 NOTE — Consult Note (Signed)
Consultation Note Date: 12/11/2017   Patient Name: [name] [name]  DOB: [birth year]  MRN: [id number]  Age / Sex: 62 y.o., female  PCP: Pleas Koch, NP Referring Physician: Patrecia Pour, MD  Reason for Consultation: Establishing goals of care  HPI/Patient Profile: 62 y.o. female  with past medical history of MAI infection and RU lobectomy in 2013, bronchiectasis, broncho-pleural fistula, anxiety, insomnia, and constipation who was admitted on 12/11/2017 with hemoptysis.   Clinical Assessment and Goals of Care:  I have reviewed medical records including EPIC notes, labs and imaging, received report from the care team, assessed the patient and then discussed diagnosis prognosis, GOC, EOL wishes, disposition and options.  I introduced Palliative Medicine as specialized medical care for people living with serious illness. It focuses on providing relief from the symptoms and stress of a serious illness. The goal is to improve quality of life for both the patient and the family.  We discussed a brief life review of the patient. She has worked for [employer] for years.  She has been sick for the last 6 years and had a severe re-lapse over the last 6 months.  She has spent the past month in the hospital at [facility] where she suffered with recurrent pneumonia and underwent 3 bronchoscopies.   She currently lives at home with her sister Gorden Harms and her other sister Luz Lex is is [place].  Gwinda Passe is her HCPOA.  As far as functional and nutritional status she is still able to walk around, she drinks protein supplements but is very thin (apprx 105 lbs).  She does not use oxygen at home.  She has suffered with severe constipation recently.  The night before admission, Jaileen suffered severe hemoptysis.  "It looked like a murder scene, and I couldn't breathe.  I just can not go thru that again" she tells me.   Her fear and anxiety are evident as she says this.  Tallyn, appropriately, wants to have a plan in place so the next time this happens she does not suffer.  We discussed her current illness and what it means in the larger context of her on-going co-morbidities.  Natural disease trajectory and expectations at EOL were discussed.  I attempted to elicit values and goals of care important to the patient.  Evella no longer wants hospitalization, procedures or aggressive treatments.  She states "I'm done."  Her wish is to be able to return to work for a few weeks just to get her affairs in order.   She wants to relax and attempt to make the best of the time she has left as she has seen multiple other family members do with hospice services.    Questions and concerns were addressed. We agreed to meet again on 1/8 at 10:30 with her sisters.  Primary Decision Maker:  PATIENT    SUMMARY OF RECOMMENDATIONS    Family meeting 1/8 at 10:30.  Will determine if hospice if appropriate. Recommend stabilizing patient and instituting comfort measures prior to  discharge.  Code Status/Advance Care Planning:  DNR   Symptom Management:   Consider adding SSRI for anxiety and depression  Consider increasing xanax to allow for PRNs during the day given level of anxiety.  PRN morphine for SOB  Home with oxygen for comfort (if she is not smoking)  Daily senna, daily miralax  Additional Recommendations (Limitations, Scope, Preferences):  Full Comfort Care at time of discharge.  Prognosis:  Given recurrent pneumonia, bronchiectasis, broncho-pleural fistula,and patient's desire for only comfort measures it is very reasonable to give a prognosis of 6 months or less.  Patient is at high risk for a sudden event that will take her life.    Discharge Planning: Home with Hospice when medically optimized.      Primary Diagnoses: Present on Admission: . Hemoptysis . 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 HCAP  (healthcare-associated pneumonia) . Acute blood loss anemia . Hypokalemia   I have reviewed the medical record, interviewed the patient and family, and examined the patient. The following aspects are pertinent.  Past Medical History:  Diagnosis Date  . Arthritis   . B12 deficiency   . Bronchiectasis ([place])   . Cataracts, bilateral   . Colitis    induced by decongestants, NSAIds  . COPD (chronic obstructive pulmonary disease) ([place])    per CT 11/10/05, will minimal bronchiectasis rll  . Endometriosis   . GERD (gastroesophageal reflux disease)   . 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 Hemorrhoid   . Lichen planus   . 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 Osteoporosis    dexa per gyn  . Pneumothorax, spontaneous, tension    x 2 in her 20's.  has a chest tube for one  . Shoulder dislocation 2015    R shoulder , x 2   . 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 Marital status: Single    Spouse name: None  . Number of children: 0  . Years of education: None  . Highest education level: None  Social Needs  . Financial resource strain: None  . Food insecurity - worry: None  . Food insecurity - inability: None  . Transportation needs - medical: None  . Transportation needs - non-medical: None  Occupational History  . Occupation: HR Coordinator     Employer:     Comment: [employer]   Tobacco Use  . Smoking status: Former Smoker    Packs/day: 0.40    Years: 38.00    Pack years: 15.20    Types: Cigarettes  . Smokeless tobacco: Never Used  . Tobacco comment: stopped due to illness  Substance and Sexual Activity  . Alcohol use: Yes    Alcohol/week: 0.0 oz  . Drug use: No  . Sexual activity: Not Currently    Comment: 1st intercourse 62 yo-More than 5 partners  Other Topics Concern  . None  Social History Narrative   sister lives w/ pt     Lost mom 12-2013   Family History  Problem Relation Age of Onset  . Diabetes Mother        GM  . Heart disease Mother        in her 19s  . Heart disease Maternal Grandfather   . Cancer Father        sarcoma  . Diabetes Maternal Uncle   . Diabetes Maternal Grandmother   .  Colon cancer Neg Hx   . Breast cancer Neg Hx   . Esophageal cancer Neg Hx   . Rectal cancer Neg Hx   . Stomach cancer Neg Hx    Scheduled Meds: . budesonide  0.5 mg Inhalation BID  . magnesium oxide  400 mg Oral Daily  . pantoprazole  40 mg Oral BID AC  . posaconazole  300 mg Oral Daily  . potassium chloride  40 mEq Oral Once  . vitamin B-12  1,000 mcg Oral Daily   Continuous Infusions: . sodium chloride 10 mL/hr at 12/11/17 1447  . 0.9 % NaCl with KCl 40 mEq / L    . ceFEPime (MAXIPIME) IV Stopped (12/12/17 0530)  . metronidazole Stopped (12/12/17 0630)  . potassium chloride    . vancomycin 1,250 mg (12/12/17 0630)   PRN Meds:.acetaminophen **OR** acetaminophen, albuterol, ALPRAZolam, benzonatate, HYDROcodone-acetaminophen, lidocaine, methocarbamol, morphine injection, ondansetron **OR** ondansetron (ZOFRAN) IV Allergies  Allergen Reactions  . Diphenhydramine Hcl Other (See Comments)    Makes lungs bleed  . Pseudoephedrine Hcl Other (See Comments)    Has Ischemic Colitis. Makes colon bleed  . Alendronate Sodium Other (See Comments)    chest pain  . Antihistamines, Chlorpheniramine-Type Other (See Comments)    "makes lungs bleed"  . Benadryl [Diphenhydramine] Other (See Comments)    "makes lungs bleed" (patient states she can tolerate the newer ones, like Claritin)  . Levofloxacin Anxiety  . Nsaids Other (See Comments)    ischemic colitis  . Other Other (See Comments)    (All anti-inflammatories) ischemic colitis  . Pheniramine Other (See Comments)    "makes lungs bleed"  . Pseudoephedrine Other (See Comments)    Ischemic colitis  . Alendronate Sodium Other (See Comments)    Heart burn  .  Guaifenesin Itching  . Robitussin A-C [Guaifenesin-Codeine] Itching and Other (See Comments)    Palms itching   Review of Systems Severe constipation, anxiety, insomnia, hemoptysis, chronic cough, sputum  Physical Exam  Thin anxious female, awake, alert, emotional but coherent and appropriate.   Vital Signs: BP 104/71 (BP Location: Right Arm)   Pulse 73   Temp 99.3 F (37.4 C) (Oral)   Resp (!) 27   Wt 48.7 kg (107 lb 5.8 oz)   LMP 11/05/2004   SpO2 100%   BMI 17.33 kg/m  Pain Assessment: 0-10   Pain Score: 6    SpO2: SpO2: 100 % O2 Device:SpO2: 100 % O2 Flow Rate: .O2 Flow Rate (L/min): 2 L/min  IO: Intake/output summary:   Intake/Output Summary (Last 24 hours) at 12/12/2017 0816 Last data filed at 12/12/2017 0630 Gross per 24 hour  Intake 1100 ml  Output 1577 ml  Net -477 ml    LBM: Last BM Date: 12/10/17 Baseline Weight: Weight: 48.7 kg (107 lb 5.8 oz) Most recent weight: Weight: 48.7 kg (107 lb 5.8 oz)     Palliative Assessment/Data: 50%     Time In: 4:30 Time Out: 5:39 Time Total: 69 MIN. Greater than 50%  of this time was spent counseling and coordinating care related to the above assessment and plan.  Signed by: Florentina Jenny, PA-C Palliative Medicine Pager: [phone]  Please contact Palliative Medicine Team phone at [phone] for questions and concerns.  For individual provider: See Shea Evans

## 2017-12-12 NOTE — Progress Notes (Signed)
CRITICAL VALUE ALERT  Critical Value:  Potassium <2  Date & Time Notied:  0644  Provider Notified: Amion Paged KirbyNP  Orders Received/Actions taken: Awaiting orders    Mick Sell RN

## 2017-12-12 NOTE — Progress Notes (Addendum)
Hospice and [facility]) Hospital Liaison: RN visit  Notified by Almyra Free [facility], at [phone] of patient request for [facility] services at home after discharge. Chart and patient information reviewed by [facility] physician.  Hospice eligibility approved.   Writer spoke with patient at bedside to initiate education related to hospice philosophy, services and team approach to care.  Patient verbalized understanding of information given.  Per discussion, plan is for discharge to home by private vehicle on 12/13/17.  Please send signed and completed DNR form home with patient.  Patient will need prescriptions for discharge comfort medications.   DME needs have been discussed, patient currently has no equipment in the home.  Patient requests the following DME for delivery to the home:  O2 concentrator.  (I ordered portable tank from [facility] to be delivered to patient room).  [facility] equipment manager has been notified and will contact [place] to arrange delivery to the home.  Home address has been verified and is correct in the chart.  Hoyle Sauer at  [phone] or [phone] is the person to contact to arrange time of delivery.  [facility]erral [facility] aware of the above.  Completed discharge summary will need to be faxed to [facility] at [phone], when final.  Please notify [facility] when patient is ready to leave the unit at discharge.  (Call [phone] or [phone] after 5pm.)  [facility] information and contact numbers given to patient during this visit.  Above information shared with Almyra Free, [facility].  Please call with any hospice related questions.  Thank you for this referral.    Edyth Gunnels, RN, [facility] Hospital Liaison [phone]  All hospital liaisons are now on [place].

## 2017-12-12 NOTE — Progress Notes (Signed)
   12/12/17 1300  Clinical Encounter Type  Visited With Patient and family together  Visit Type Follow-up  Referral From Chaplain  Consult/Referral To Chaplain  Spiritual Encounters  Spiritual Needs Emotional  Stress Factors  Patient Stress Factors Health changes  Family Stress Factors Exhausted  Chaplain visited with the PT and family.  The PT was on her way to sleep but was aware of presence of Chaplain.  Chaplain spoke to family about hospice care (not as a subject but a mention from PT family about a family member and their experiences with hospice).  In the words of the family, Bonney Roussel has the light of God behind his eyes.

## 2017-12-12 NOTE — Progress Notes (Signed)
Full note to follow.  Mrs. Kilroy is very resolute in her decisions.   DNR  No invasive procedures  Do not return to the hospital.  No further antibiotics  No further IV fluids  Remove PICC line after potassium is restored and prior to d/c from the hospital  [facility] for care at home.  Will order comfort meds.  Anticipate discharge from the hospital as soon as her electrolytes are stable and hospice is on board.  Florentina Jenny, PA-C Palliative Medicine Pager: [phone]

## 2017-12-12 NOTE — Progress Notes (Signed)
Daily Progress Note   Patient Name: [name] [name]                                                                                 Date: 12/12/2017 DOB: 06/21/1956  Age: 62 y.o. MRN#: [id number] Attending Physician: Patrecia Pour, MD Primary Care Physician: Pleas Koch, NP Admit Date: 12/11/2017  Reason for Consultation/Follow-up: Establishing goals of care  Subjective: Meeting held with patient and two sisters, [name] [name] and [name] [name].  We discussed her illness and its course over the last 6 months.  We discussed her broncho-pleural fistula.  She refuses all surgery.  So there will be no repair of the broncho-pleural fistula.  I expressed my concern that she will never be rid of chronic illness as long as the fistula is present.   Sister [name] [name] brought up holistic medicine and techniques.  We discussed how these may be used in conjunction with Hospice care.  We reviewed again the events of the horrific night she had with severe hemoptysis and an inability to breathe that lead to her admission.  She wants to create a plan to ensure that will never happen again.  [name] [name] is very resolute in her decisions.   DNR.  Gold form and MOST form completed  No invasive procedures  Do not return to the hospital.  No further antibiotics or antifungal  No further IV fluids  Humidified Oxygen at home.  Remove PICC line after potassium is restored and prior to d/c from the hospital  [place] [facility] for care at home.  We discussed a best case scenario - her hope is that she may stop the antibiotics/antifungal and feel better.  We discussed the worse case scenario that she would decline rapidly or that she would have another episode of severe hemoptysis.  I committed to provide  medications on discharge that could be given in the event of another episode of severe hemoptysis that would allow her to sleep.  We talked at length about  calling hospice in the event of distress.  The family is comfortable with that.  They have been involved with Hospice for several family members. [name] [name] is comfortable with giving comfort meds as appropriate.   Assessment: 62 y.o.femalewith past medical history of M infection and RU lobectomy in 2013, bronchiectasis, broncho-pleural fistula, anxiety, insomnia, and constipationwho was admitted on 1/7/2019with severe hemoptysis. She has made a firm decision for comfort measures only and Hospice care.     Length of Stay: 1  Current Medications: Scheduled Meds:  . budesonide  0.5 mg Inhalation BID  . magnesium oxide  400 mg Oral Daily  . pantoprazole  40 mg Oral BID AC  . senna  2 tablet Oral QHS  . vitamin B-12  1,000 mcg Oral Daily    Continuous Infusions: . sodium chloride 10 mL/hr at 12/12/17 1000  . 0.9 % NaCl with KCl 40 mEq / L 90 mL/hr at 12/12/17 1000  . metronidazole Stopped (12/12/17 0630)  . potassium chloride 10 mEq (12/12/17 1246)    PRN Meds: acetaminophen **OR** acetaminophen, albuterol, ALPRAZolam, benzonatate, haloperidol, HYDROcodone-acetaminophen, lidocaine, methocarbamol, morphine CONCENTRATE, ondansetron **OR** ondansetron (ZOFRAN) IV  Physical Exam       2 of 6   Vital Signs: BP 127/64 (BP Location: Right Arm)   Pulse 73   Temp 98.8 F (37.1 C) (Oral)   Resp 20   Wt 48.7 kg (107 lb 5.8 oz)   LMP 11/05/2004   SpO2 100%   BMI 17.33 kg/m  SpO2: SpO2: 100 % O2 Device: O2 Device: Nasal Cannula O2 Flow Rate: O2 Flow Rate (L/min): 2 L/min  Intake/output summary:   Intake/Output Summary (Last 24 hours) at 12/12/2017 1248 Last data filed at 12/12/2017 1000    Gross per 24 hour  Intake 1203.17 ml  Output 975 ml  Net 228.17 ml   LBM: Last BM Date: 12/10/17 Baseline Weight: Weight:  48.7 kg (107 lb 5.8 oz) Most recent weight: Weight: 48.7 kg (107 lb 5.8 oz)       Palliative Assessment/Data: 50%         Patient Active Problem List   Diagnosis Date Noted  . Palliative care encounter   . Encounter for hospice care discussion   . Hemoptysis 12/11/2017  . HCAP (healthcare-associated pneumonia) 12/11/2017  . Acute blood loss anemia 12/11/2017  . Hypokalemia 12/11/2017  . Encounter for chronic pain management 10/31/2017  . 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 PCP NOTES >>>>>>>>>>>>>>>>>>>>>>>> 11/24/2015  . Subclavian vein obstruction (HCC)   . Obstruction of vein 09/25/2015  . Arm swelling ---R--- 07/20/2015  . Upper back pain 03/31/2015  . Right shoulder injury 01/06/2015  . Prediabetes 04/02/2014  . 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 Avulsion fracture of trochanter of femur ([place]) 10/17/2013  . Anxiety 10/17/2013  . Intertrochanteric fracture of femur ([place]) 10/17/2013  . Other dysphagia 12/03/2012  . Odynophagia 11/28/2012  . 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 S/P lobectomy of lung 11/18/2012  . History of surgical procedure 11/18/2012  . Weight loss 12/13/2011  . 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 Insomnia 11/22/2010  . PALPITATIONS 02/27/2009  . CIGARETTE SMOKER 12/24/2007  . BRONCHIECTASIS 12/24/2007  . HEPATIC CYST 12/24/2007  . HEADACHES, HX OF 12/24/2007  . Vascular insufficiency of intestine ([place]) 12/24/2007  . B12 DEFICIENCY 12/30/2006  . ASTHMA 12/30/2006  . COPD GOLD 0  12/30/2006  . GERD 12/30/2006  . ENDOMETRIOSIS NOS 12/30/2006  . LICHEN PLANUS 62/95/2841  . Chronic pain 12/30/2006  . Osteoporosis 12/30/2006  Palliative Care Plan    Recommendations/Plan:  DNR  No invasive procedures  Do not return to the hospital.  No further antibiotics  No further IV fluids  Remove PICC line after potassium is restored and prior to d/c from the hospital  [place] for care  at home.   Will order comfort meds.  Will write discharge Rx scripts for patient for Haldol, Xanax and Morphine (sl).  These will be needed if she has another severe hemoptysis event.   Goals of Care and Additional Recommendations: ? Full comfort.  Code Status: DNR  Prognosis:  Less than 6 months.  Discharge Planning:  Home with hospice once potassium is corrected.  Care plan was discussed with attending MD, RN, patient and sisters.  Thank you for allowing the Palliative Medicine Team to assist in the care of this patient.  Total time spent:  60 min.     Greater than 50%  of this time was spent counseling and coordinating care related to the above assessment and plan.  Florentina Jenny, PA-C Palliative Medicine  Please contact Palliative MedicineTeam phone at [phone] for questions and concerns between 7 am - 7 pm.   Please see AMION for individual provider pager numbers.

## 2017-12-12 NOTE — Telephone Encounter (Signed)
Please call [name] [name], what does that mean? Is she referring to me being her primary contact now that she is on palliative care?  If so then agreed.

## 2017-12-12 NOTE — Care Management Note (Signed)
Case Management Note  Patient Details  Name: [name] [name] MRN: [id number] Date of Birth: 06/07/1956  Subjective/Objective:  Pt admitted on 12/11/17 with bronchiectasis with hemoptysis and broncho-pleural fistula.  Pt with recent lengthy stay at [facility] for pneumonia.  PTA, pt resided at home with sister; she is currently not willing to undergo repeat bronch or surgical intervention, and desires to go home with comfort care.  CM consult for Home Hospice.                     Action/Plan: Met with pt to discuss Home Hospice choice.  Pt [facility] for Hospice services.  She denies any DME needs for home except home oxygen.  Her sister will be able to provide assistance at discharge.  Called referral in to Amy with Hosp[facility] and [place].  She will see pt later today to make arrangements for home.  Pt active with [facility] prior to admission; will notify [facility] agency that pt will now be discharging home with hospice services.    Expected Discharge Date:                  Expected Discharge Plan:  Home w Hospice Care  In-House Referral:     Discharge planning Services  CM Consult  Post Acute Care Choice:  Hospice Choice offered to:  Patient  DME Arranged:  Oxygen DME Agency:  [place]:  RN, Social Work, Nurse's Aide [place] Agency:  [facility] [place]  Status of Service:  In process, will continue to follow  If discussed at Long Length of Stay Meetings, dates discussed:    Additional Comments:  Reinaldo Raddle, RN, BSN  Trauma/Neuro ICU Case Manager [phone]

## 2017-12-12 NOTE — Progress Notes (Signed)
Name: [name] [name] MRN: [id number] DOB: 1956-11-21    ADMISSION DATE:  12/11/2017 CONSULTATION DATE:  12/11/2017  REFERRING MD :  Dr. 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 During stay patient underwent bronchoscopy with BAL x 2 and Transbronchial biopsy, no definitive organism was identified, treatment with vancomycin, cefepime, flagyl, and posaconazole (12/22-2/2) via PICC, with 3% nebs, Aerobika and chest vest therapy     Presents to ED on 1/7 with new onset hemoptysis. Patient states that since discharge on 12/27 she has had copious secretions that are yellow/green in color with streaks of red. However, at 0200 she woke up coughing bright red blood, estimates about a cup, during this time patient felt like she could not breathe so she called EMS. Reports that she has been compliant with medication regimen. Has smoked about 3-4 cigarettes since discharge. Reports that she is a DNR. Is not willing to undergo surgery, however, would be open to bronchoscopy if needed. PCCM asked to consult.   STUDIES:  11/27 CT Chest > AIRWAYS, LUNGS, PLEURA: Emphysema. Right upper lobectomy. Cystic right apical opacity unchanged. Patchy consolidations along the inferior margin of the cystic opacities similar to the prior exam. Interval development of complete opacification of the previously aerated right middle lobe with volume loss. The proximal right middle lobe airways are no longer visualized and are likely occluded. Improved consolidations at the medial right base. No pleural effusion. MEDIASTINUM: Normal heart size. No pericardial effusion. Normal caliber thoracic aorta. No mediastinal lymphadenopathy. IMAGED ABDOMEN: Unremarkable. SOFT TISSUES:  Unremarkable. BONES: Unremarkable.  11/30 > bronchoscopy performed without fistula visualized Respiratory cultures +Aspergillus > The oropharynx appears normal. The larynx appears normal. The vocal cords appear normal. The subglottic space is normal. The trachea is of normal caliber. The carina is sharp. Scattered thick secretions are present throughout the trachea and right tracheobronchial tree. These were cleared with either the T scope or P190 scope. A sample was  obtained for microbiologic culture. The right upper lobectomy stump appears well-healed. The origin of the right middle lobe isextrinsically compressed and barely passable with a P190 scope. Despite extensive searching, even in the presence of vigerous jet ventilation, no orifice was found to suggest a bronchopleural fistula.  12/3 CT Chest > AIRWAYS, LUNGS, PLEURA: Emphysema. Right upper lobectomy and unchanged cystic right apical opacity unchanged. Progressed consolidation of the upper portion of the right lower lobe (series 5, Image 44). Increased right lower lobe patchy/tree-in-bud and groundglass opacities associated with bronchial thickening. Complete opacification of the right middle lobe with volume loss again noted and right middle lobe airway occlusion. No pleural effusion. MEDIASTINUM: Normal heart size. No pericardial effusion. Normal caliber thoracic aorta. No mediastinal lymphadenopathy. IMAGED ABDOMEN: Unremarkable. SOFT TISSUES: Unremarkable. BONES: Unremarkable.  12/13 CT Chest > Short interval increase in patchy consolidations, groundglass opacities, and asymmetric interstitial opacities in the right lung base could reflect worsening multifocal pneumonia, superimposed acute aspiration (particularly given new RLL endobronchial opacification), and/or asymmetric edema. 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 Attention on follow-up. --Stable to slight increase in consolidative opacities in the upper  portion of the right lower lobe. 12/15 > Bronch with BAL >  - Right lower lobe pneumonia  - Tortorous trachea noted, s/p RUL lobectomy with stump -  Mucoid secretions encountered in right mainstem/BI, washed and sent for bacterial/fungal cultures.  - Bronchoalveolar lavage was performed or right middle and lower lobe, combined, sent for cell counts, bacterial/fungal cultures, AFB, Galactomannan, PJP  12/21 CT Chest > New right lower lobe airspace consolidation, concerning for infection. - Cystic lesion in the right superior hemithorax may be secondary to bronchopleural fistula  12/21 > Bronch with endobronchial biopsy > pathology with fibrous stroma with focal lymphohistiocytic infiltrate, possible loose granuloma, GMS and AFB stains negative > - Unresolving right middle lobe infiltrate  - Unresolving right lower lobe infiltrate  - The airway examination of the left lung was normal.  - Distortion was found in the bronchus intermedius.  - Mucosal inflammation was visualized in the posterior basal segment of the right lower lobe (B10).  - Exudate was found in the posterior basal segment of the right lower lobe (B10).  - Bronchoalveolar lavage was performed.  - Transbronchial lung biopsies were performed.  CT Chest 1/7 > 1. No pulmonary embolus. 2. Recent outside hospital CTs are not available for direct comparison, reports were reviewed. Post right upper lobectomy with thick-walled cavity at the right lung apex. The thick-walled component was described on recent outside CT, however has progressed from September 2018. 3. Increased soft tissue density adjacent to suture line in the right lung from September 2018, nonspecific imaging features, may be secondary to infection or atelectasis. 4. Patchy, ground-glass, and consolidative multifocal right lower lobe opacities with small right pleural effusion, suspicious for pneumonia. 5. Patchy ground-glass opacity in the left  lower lobe is nonspecific and may be infectious or inflammatory. 6. Prominent subcarinal node is likely reactive. 7. Aortic Atherosclerosis (ICD10-I70.0) and Emphysema (ICD10-J43.9).  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  has a past surgical history that includes Knee surgery; ovarian cyst removed; Video bronchoscopy (02/15/2012); Mediastinoscopy (05/25/2012); Tonsillectomy; Flexible bronchoscopy (11/05/2012); Thoracotomy (11/05/2012); Lobectomy (11/05/2012); and Esophagogastroduodenoscopy (12/03/2012). Prior to Admission medications   Medication Sig Start Date End Date Taking? Authorizing Provider  acetaminophen (TYLENOL) 500 MG tablet Take 500 mg by mouth every 4 (four) hours as needed for moderate pain or fever. For fever    [provider]  albuterol (PROVENTIL HFA) 108 (90 Base) MCG/ACT inhaler Inhale 2 puffs into the lungs every 4 (four) hours as needed for wheezing or shortness of breath.    [provider]  ALPRAZolam Duanne Moron) 0.25 MG tablet Take 1 tablet (0.25 mg total) by mouth at bedtime as needed for anxiety or sleep. 12/08/17   Plovsky, Berneta Sages, MD  aspirin 81 MG chewable tablet Chew 81 mg by mouth daily.    [provider]  calcium carbonate (TUMS) 500 MG chewable tablet Chew by mouth.    [provider]  Cefepime HCl 2 GM/100ML SOLN Inject into the vein. 11/28/17 01/06/18  [provider]  Cholecalciferol (VITAMIN D PO) Take 1 tablet by mouth daily.    [provider]  HYDROcodone-acetaminophen (NORCO) 7.5-325 MG tablet Take 1 tablet by  mouth every 6 (six) hours as needed for moderate pain. 12/01/17   Pleas Koch, NP  lidocaine (LIDODERM) 5 % Place onto the skin. 11/30/17 12/30/17  [provider]  LYSINE PO Take 1 tablet by mouth daily.  [provider]  Magnesium 400 MG TABS Take 1 tablet by mouth daily.    [provider]  magnesium oxide (MAG-OX) 400 MG tablet Take 400 mg by mouth.    [provider]  methocarbamol (ROBAXIN) 500 MG tablet Take 1 tablet (500 mg total) by mouth every 8 (eight) hours as needed for muscle spasms. Patient taking differently: Take 250 mg by mouth every 8 (eight) hours as needed for muscle spasms.  01/02/15   Hudnall, Sharyn Lull, MD  metroNIDAZOLE (FLAGYL) 500 MG tablet Take 500 mg by mouth. 11/30/17 01/06/18  [provider]  nicotine polacrilex (NICORETTE) 4 MG lozenge Place 4 mg inside cheek every 3 (three) hours as needed for smoking cessation. For nicotine cravings.    [provider]  Ondansetron HCl (ZOFRAN PO) Take 4 mg by mouth as needed.    [provider]  pantoprazole (PROTONIX) 40 MG tablet Take 1 tablet (40 mg total) by mouth 2 (two) times daily before a meal. 07/27/17   Pleas Koch, NP  POSACONAZOLE PO Take 300 mg by mouth daily at 12 noon.    [provider]  Potassium 99 MG TABS Take by mouth.    [provider]  Potassium Gluconate 595 MG CAPS Take 1 capsule by mouth daily.    [provider]  QVAR REDIHALER 80 MCG/ACT AERB Inhale 2 puffs into the lungs 2 (two) times daily. 04/24/17   [provider]  sodium chloride HYPERTONIC 3 % nebulizer solution Inhale 4 mL by nebulization Two (2) times a day. Inhale 4 ml twice daily 10/25/16   [provider]  vancomycin IVPB Inject into the vein.    [provider]  vitamin B-12 (CYANOCOBALAMIN) 100 MCG tablet Take by mouth.    [provider]   Allergies  Allergen Reactions  . Diphenhydramine Hcl Other (See  Comments)    Makes lungs bleed  . Pseudoephedrine Hcl Other (See Comments)    Has Ischemic Colitis. Makes colon bleed  . Alendronate Sodium Other (See Comments)    chest pain  . Antihistamines, Chlorpheniramine-Type Other (See Comments)    "makes lungs bleed"  . Benadryl [Diphenhydramine] Other (See Comments)    "makes lungs bleed" (patient states she can tolerate the newer ones, like Claritin)  . Levofloxacin Anxiety  . Nsaids Other (See Comments)    ischemic colitis  . Other Other (See Comments)    (All anti-inflammatories) ischemic colitis  . Pheniramine Other (See Comments)    "makes lungs bleed"  . Pseudoephedrine Other (See Comments)    Ischemic colitis  . Alendronate Sodium Other (See Comments)    Heart burn  . Guaifenesin Itching  . Robitussin A-C [Guaifenesin-Codeine] Itching and Other (See Comments)    Palms itching    FAMILY HISTORY:  family history includes Cancer in her father; Diabetes in her maternal grandmother, maternal uncle, and mother; Heart disease in her maternal grandfather and mother. SOCIAL HISTORY:  reports that she has quit smoking. Her smoking use included cigarettes. She has a 15.20 pack-year smoking history. she has never used smokeless tobacco. She reports that she drinks alcohol. She reports that she does not use drugs.  REVIEW OF SYSTEMS:   All negative; except for those that are bolded, which indicate positives.  Constitutional: weight loss, weight gain, night sweats, fevers, chills, fatigue, weakness.  HEENT: headaches, sore throat, sneezing, nasal congestion, post nasal drip, difficulty swallowing, tooth/dental problems, visual complaints, visual changes, ear aches. Neuro: difficulty with speech,  weakness, numbness, ataxia. CV:  chest pain, orthopnea, PND, swelling in lower extremities, dizziness, palpitations, syncope.  Resp: cough, hemoptysis, dyspnea, wheezing. GI: heartburn, indigestion, abdominal pain, nausea, vomiting, diarrhea,  constipation, change in bowel habits, loss of appetite, hematemesis, melena, hematochezia.  GU: dysuria, change in color of urine, urgency or frequency, flank pain, hematuria. MSK: joint pain or swelling, decreased range of motion. Psych: change in mood or affect, depression, anxiety, suicidal ideations, homicidal ideations. Skin: rash, itching, bruising.  SUBJECTIVE:  Continues to have hemoptysis which is improved since yesterday.  VITAL SIGNS: Temp:  [98.7 F (37.1 C)-100.5 F (38.1 C)] 98.8 F (37.1 C) (01/08 1000) Pulse Rate:  [55-82] 73 (01/08 0800) Resp:  [15-42] 20 (01/08 0800) BP: (102-149)/(62-87) 127/64 (01/08 0800) SpO2:  [93 %-100 %] 100 % (01/08 0800) Weight:  [107 lb 5.8 oz (48.7 kg)] 107 lb 5.8 oz (48.7 kg) (01/07 2200)  PHYSICAL EXAMINATION: Gen:      No acute distress, frail elderly HEENT:  EOMI, sclera anicteric Neck:     No masses; no thyromegaly Lungs:    Clear to auscultation bilaterally; normal respiratory effort CV:         Regular rate and rhythm; no murmurs Abd:      + bowel sounds; soft, non-tender; no palpable masses, no distension Ext:    No edema; adequate peripheral perfusion Skin:      Warm and dry; no rash Neuro: alert and oriented x 3 Psych: normal mood and affect   Recent Labs  Lab 12/11/17 0256 12/11/17 0312 12/12/17 0531  NA 140 143 137  K 2.1* 2.1* <2.0*  CL 99* 100* 96*  CO2 27  --  28  BUN 4* <3* <5*  CREATININE 0.76 0.70 0.71  GLUCOSE 107* 109* 91   Recent Labs  Lab 12/11/17 0601 12/11/17 1710 12/12/17 0531  HGB 9.4* 9.2* 8.9*  HCT 29.9* 28.4* 27.7*  WBC 9.8 9.1 9.6  PLT 607* 545* 545*   Ct Angio Chest Pe W And/or Wo Contrast  Addendum Date: 12/12/2017   ADDENDUM REPORT: 12/12/2017 13:30 ADDENDUM: The original report was by Dr. Jeb Levering. The following addendum is by Dr. Van Clines: I received a phone call today from pulmonology at about 1:15 p.m. regarding changes compared to the prior chest CT from [facility] dated 11/23/2017 which had been intervally loaded into the [facility] system after the original dictation was issued. Comparing Dr. Andrew Au original report and looking over the [facility] exam, my impression is that on the 12/11/2017 exam there is increase in fluid and/or airspace opacity along the posterior loculations of the right apical cavity, along with increase in airspace opacity in the superior segment right lower lobe adjacent to the cavity. The airspace opacity in the posterior basal segment right lower lobe shows increasing consolidation. In addition, as of 12/11/2017 there are faint bilateral ground-glass opacities which are increased from the [facility] exam of 11/23/2017, and nonspecific but possibly from edema. Electronically Signed   By: Van Clines M.D.   On: 12/12/2017 13:30   Result Date: 12/12/2017 CLINICAL DATA:  Hemoptysis today. Shortness of breath. Recent admission for 1 month at [facility] with pneumonia. EXAM: CT ANGIOGRAPHY CHEST WITH CONTRAST TECHNIQUE: Multidetector CT imaging of the chest was performed using the standard protocol during bolus administration of intravenous contrast. Multiplanar CT image reconstructions and MIPs were obtained to evaluate the vascular anatomy. CONTRAST:  9m ISOVUE-370 IOPAMIDOL (ISOVUE-370) INJECTION 76% COMPARISON:  Chest CT 08/09/2017. Chest CT report from  11/24/2017 at an outside institution, no images available. FINDINGS: Cardiovascular: There are no filling defects within the pulmonary arteries to suggest pulmonary embolus. Thoracic aorta is normal in caliber with aortic atherosclerosis. Mild right heart dilatation. Minimal coronary artery calcifications. No pericardial fluid. Left upper extremity PICC with tip in the SVC. Mediastinum/Nodes: Increase subcarinal soft tissue density since September 2018 exam measuring 13 mm short axis, previously 10 mm. No left hilar adenopathy. Soft tissue density at the right hilum likely hilar nodes but  ill-defined. Lungs/Pleura: Postsurgical change in the right hemithorax post right upper lobectomy. Right apical cavity, increased wall thickening from September 2018 CT. Increased soft tissue density adjacent to sutures in the adjacent right lower lobe. Patchy and ground-glass opacities in the more inferior right lower lobe, confluent dependently with small right pleural effusion. Small right lower lobe paramediastinal nodular opacity of the pleural tail and is favored to be atelectasis/scarring. Minimal patchy/ ground-glass opacity in the left lower lobe. Advanced emphysema. Upper Abdomen: No acute abnormality. Musculoskeletal: Unchanged vertebral body hemangioma within T10, likely smaller hemangiomas at additional levels. No acute osseous abnormality. Postsurgical change in the right ribs. Review of the MIP images confirms the above findings. IMPRESSION: 1. No pulmonary embolus. 2. Recent outside hospital CTs are not available for direct comparison, reports were reviewed. Post right upper lobectomy with thick-walled cavity at the right lung apex. The thick-walled component was described on recent outside CT, however has progressed from September 2018. 3. Increased soft tissue density adjacent to suture line in the right lung from September 2018, nonspecific imaging features, may be secondary to infection or atelectasis. 4. Patchy, ground-glass, and consolidative multifocal right lower lobe opacities with small right pleural effusion, suspicious for pneumonia. 5. Patchy ground-glass opacity in the left lower lobe is nonspecific and may be infectious or inflammatory. 6. Prominent subcarinal node is likely reactive. 7. Aortic Atherosclerosis (ICD10-I70.0) and Emphysema (ICD10-J43.9). Electronically Signed: By: Jeb Levering M.D. On: 12/11/2017 04:25   Dg Chest Portable 1 View  Result Date: 12/11/2017 CLINICAL DATA:  62 y/o F; right-sided chest pain and coughing bright red blood. Increase shortness of breath.  EXAM: PORTABLE CHEST 1 VIEW COMPARISON:  09/05/2017 chest radiograph.  08/09/2017 chest CT. FINDINGS: Stable cardiac silhouette given projection and technique. Stable postsurgical changes in the right upper lung zone. Increased opacification of the apical cavity may represent fluid/hemorrhage. Increased reticular opacities of the aerated right lung, likely bronchitic changes. Clear left lung. Stable rightward mediastinal shift from volume loss in right hemithorax. IMPRESSION: Increased attenuation of right apical cavity, possibly fluid/hemorrhage. Bronchitic changes in aerated right lung. Clear left lung. Electronically Signed   By: Kristine Garbe M.D.   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 with radiology.  There appears to be worsening consolidation on the right with more fluid in the cavity in spite of aggressive antibiotic treatment. Discussed with Dr. Cynda Acres, [facility] who suggested we could try prednisone in case this is an inflammatory, allergic reaction however the patient does not want to try this. Patient has met with palliative care and is adamant that she wants to stop all treatment and go  home.  I feel her decision to go to hospice is reasonable given her prolonged hospitalization and frailty, complicated medical issues PCCM will be available as needed. Please call with questions  More then 1/2 the time of the 40 min visit was spent in counseling and/or coordination of care with the patient and family.  Marshell Garfinkel MD [facility] Pager [phone] If no answer or after 3pm call: [phone] 12/12/2017, 2:07 PM

## 2017-12-12 NOTE — Plan of Care (Signed)
  Education: Knowledge of General Education information will improve 12/12/2017 0116 - Progressing by Colvin Caroli, RN   Health Behavior/Discharge Planning: Ability to manage health-related needs will improve 12/12/2017 0116 - Progressing by Colvin Caroli, RN   Clinical Measurements: Ability to maintain clinical measurements within normal limits will improve 12/12/2017 0116 - Progressing by Colvin Caroli, RN   Clinical Measurements: Will remain free from infection 12/12/2017 0116 - Progressing by Colvin Caroli, RN   Clinical Measurements: Diagnostic test results will improve 12/12/2017 0116 - Progressing by Colvin Caroli, RN   Clinical Measurements: Respiratory complications will improve 12/12/2017 0116 - Progressing by Colvin Caroli, RN   Clinical Measurements: Cardiovascular complication will be avoided 12/12/2017 0116 - Progressing by Colvin Caroli, RN   Nutrition: Adequate nutrition will be maintained 12/12/2017 0116 - Progressing by Colvin Caroli, RN   Coping: Level of anxiety will decrease 12/12/2017 0116 - Progressing by Colvin Caroli, RN   Elimination: Will not experience complications related to bowel motility 12/12/2017 0116 - Progressing by Colvin Caroli, RN   Elimination: Will not experience complications related to urinary retention 12/12/2017 0116 - Progressing by Colvin Caroli, RN

## 2017-12-12 NOTE — Progress Notes (Signed)
[facility] PROGRESS NOTE  LOTUS SANTILLO  LKG:[id number] DOB: 03/24/1956 DOA: 12/11/2017 PCP: Pleas Koch, NP  Brief Narrative: [name] [name] is a 62 y.o. female with a history of bronchiectasis, bronchopleural fistula, and atypical mycobacterial infection s/p RU lobectomy 2013 who was recently admitted in [facility] for fever with pneumonia and had a protracted stay from 11/27-12/27/2018 during which patient had repeated CTs and had also undergone bronchoscopy because of patient's pneumonia was not clear and eventually was discharged home on vancomycin, cefepime, flagyl, and posaconazole with plans to follow up 12/26/2017. She presented 1/7 afterwaking up at 2am coughing with significant subsequent hemoptysis. She became short of breath and EMS was called and during which patient was found to be hypoxic. In the ED she had no further hemoptysis, though hgb was 9.9. She reported being given IV iron for anemia at [facility] recently. CT chest showed bilateral infiltrative process concerning for pneumonia with cavitary process in the right apex which has progressed from prior scans. Pulmonary critical care was consulted, though patient declines interventions and opts for home hospice care at discharge.  Subjective: Still having smaller volume of hemoptysis, but nothing like initial episode. No fevers. Declines future interventions as these have only ever made her worse. Has been NPO yesterday and this AM, wants something by mouth.   Objective: BP 127/64 (BP Location: Right Arm)   Pulse 73   Temp 98.8 F (37.1 C) (Oral)   Resp 20   Wt 48.7 kg (107 lb 5.8 oz)   LMP 11/05/2004   SpO2 100%   BMI 17.33 kg/m   Gen: Pleasant female in no distress Pulm: Nonlabored with scattered rhonchi bilaterally  CV: RRR, no murmur, no JVD, no edema GI: Soft, NT, ND, +BS  Neuro: Alert and oriented, exhibiting full cognitive abilities and capacity for cmplex thought. No focal deficits. Ext: Warm, no  deformities. LUE PICC in place.  Skin: No rashes, lesions no ulcers  Assessment & Plan: HCAP: PCT reassuringly negative.  - DC antimicrobials per patient wishes for comfort care only.  - Cultures NGTD. RVP neg.   Goals of care:   DNR. Gold form and MOST form completed  No invasive procedures  Do not return to the hospital. Would strongly recommend giving sufficient medications for control of anxiety and pain to obviate need for emergency services. Xanax, haldol, morphine prescribed by palliative care.  No further antibioticsor antifungal  No further IV fluids  Humidified Oxygen at home.  Remove PICC line after potassium is restored and prior to d/c from the hospital  [place] for care at home.  Bronchiectasis with hemoptysis, broncho-pleural fistula: Not willing to undergo repeat bronch or surgical intervention. Coags ok, type & screen performed 1/7.  - Continue hypertonic saline and nebs, chest PT - Will treat symptomatically. [place] for regular diet.    Iron-deficiency anemia: Treated with IV iron x2 while at [facility] per pt.  - No further CBC per comfort care wishes.   Thrombocytosis: Reactive to infection and iron deficiency.  - Noted.  Disposition: Home with hospice once severe metabolic derangements improved upon. Likely 1/9.     Vance Gather, MD [facility] Pager [phone]  If 7PM-7AM, please contact night-coverage [email or web address] Password [email or web address] 12/12/2017, 1:53 PM

## 2017-12-12 NOTE — Significant Event (Signed)
CRITICAL VALUE ALERT  Critical Value:  K+ 2.1  Date & Time Notied:  0762 12/12/17  Provider Notified: Mathews Robinsons MD  Orders Received/Actions taken: orders placed by MD, MD discussed care with pt over phone

## 2017-12-12 NOTE — Unmapped (Signed)
[facility] Pharmacist Note: Medication Consult    Cris Gibby had left message on CPP phone line last Friday re: PCP recs to start Linzess to address constipation. Review medications, no DDI identified. I called Deasia back and left message for her.      Anell Barr, PharmD, BCPS, CPP  Clinical Pharmacist Practitioner  [facility] / [facility]  Pager: [phone]      CC: M. Sterling Big

## 2017-12-13 LAB — BASIC METABOLIC PANEL
Anion gap: 11 (ref 5–15)
CHLORIDE: 104 mmol/L (ref 101–111)
CO2: 24 mmol/L (ref 22–32)
Calcium: 7.7 mg/dL — ABNORMAL LOW (ref 8.9–10.3)
Creatinine, Ser: 0.58 mg/dL (ref 0.44–1.00)
GFR calc Af Amer: >60 mL/min (ref >60)
GFR calc non Af Amer: >60 mL/min (ref >60)
GLUCOSE: 123 mg/dL — AB (ref 65–99)
Potassium: 3.1 mmol/L — ABNORMAL LOW (ref 3.5–5.1)
SODIUM: 139 mmol/L (ref 135–145)

## 2017-12-13 MED ORDER — SENNA 8.6 MG PO TABS: 2.0000 | ORAL_TABLET | Freq: Every day | ORAL | 0 refills | Status: AC

## 2017-12-13 MED FILL — MORPHINE SULF 100 MG/5 ML S: 100 | 5 days supply | Qty: 30 | Fill #0

## 2017-12-13 MED FILL — ALPRAZolam 0.25 MG TABS: 0.25 | 10 days supply | Qty: 30 | Fill #0

## 2017-12-13 NOTE — Care Management Note (Signed)
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  Pt with recent lengthy stay at [facility] for pneumonia.  PTA, pt resided at home with sister; she is currently not willing to undergo repeat bronch or surgical intervention, and desires to go home with comfort care.  CM consult for Home Hospice.                     Action/Plan: Met with pt to discuss Home Hospice choice.  Pt prefers [facility] for Hospice services.  She denies any DME needs for home except home oxygen.  Her sister will be able to provide assistance at discharge.  Called referral in to Amy with [facility].  She will see pt later today to make arrangements for home.  Pt active with [facility] prior to admission; will notify HH agency that pt will now be discharging home with hospice services.    Expected Discharge Date:  12/13/17               Expected Discharge Plan:  Home w Hospice Care  In-House Referral:     Discharge planning Services  CM Consult  Post Acute Care Choice:  Hospice Choice offered to:  Patient  DME Arranged:  Oxygen DME Agency:  [facility]  HH Arranged:  RN, Social Work, Nurse's Aide HH Agency:  [facility]  Status of Service:  Completed, signed off  If discussed at Long Length of Stay Meetings, dates discussed:    Additional Comments:  12/13/17 J. , RN, BSN Pt medically stable for discharge home today with Hospice services.  Portable oxygen tank has been delivered to pt's room; sister to transport pt home via private vehicle.  Bedside RN has faxed dc summary to [facility], per their request.    W. , RN, BSN  Trauma/Neuro ICU Case Manager [phone] 

## 2017-12-13 NOTE — Progress Notes (Signed)
Hospice and [facility]) Hospital Liaison: RN visit  Notified by [facility], at [phone] of patient request for [facility] services at home after discharge. Chart and patient information reviewed by [facility] physician.  Hospice eligibility approved.   Writer spoke with patient at bedside to initiate education related to hospice philosophy, services and team approach to care.  Patient verbalized understanding of information given.  Per discussion, plan is for discharge to home by private vehicle on 12/13/17.  Please send signed and completed DNR form home with patient.  Patient will need prescriptions for discharge comfort medications.   DME needs have been discussed, patient currently has no equipment in the home.  Patient requests the following DME for delivery to the home:  O2 concentrator.  (I ordered portable tank from [facility] to be delivered to patient room).  [facility] equipment manager has been notified and will contact [place] to arrange delivery to the home.  Home address has been verified and is correct in the chart.  Hoyle Sauer at  [phone] or [phone] is the person to contact to arrange time of delivery.  UPDATE:  Equipment set up for delivery on 12/13/17 between 100pm - 500pm.    [facility] Referral Center aware of the above.  Completed discharge summary will need to be faxed to [facility] at [phone], when final.  Please notify [facility] when patient is ready to leave the unit at discharge.  (Call [phone] or [phone] after 5pm.)  [facility] information and contact numbers given to patient during this visit.  Above information shared with Almyra Free, [facility].  Please call with any hospice related questions.  Thank you for this referral.    Edyth Gunnels, RN, [facility] Liaison [phone]  All hospital liaisons are now on [place].

## 2017-12-13 NOTE — Telephone Encounter (Signed)
Noted  

## 2017-12-13 NOTE — Telephone Encounter (Signed)
Spoke to [employer] by telephone and was advised that she wants to know if Anda Kraft will be the contact provider for patient and take phones calls and sign orders as needed. Advised Evette that Anda Kraft does agree to that.

## 2017-12-13 NOTE — Progress Notes (Signed)
   12/13/17 1500  Clinical Encounter Type  Visited With Patient  Visit Type Follow-up;Social support  Referral From Nurse  Consult/Referral To Chaplain  Spiritual Encounters  Spiritual Needs Prayer  Stress Factors  Patient Stress Factors Health changes  Chaplain visited with the PT.  The PT is going to be going home with Hospice care.  The PT talked about her life and experiences.  She is at peace and full of joy.  In the mind of the PT she is going to recover and go back to work.  The PT has no husband or children and she is happy about that.  She has had the opportunity to work for a living and live to work.  The struggle comes with what happens if she is not able to work.

## 2017-12-13 NOTE — Progress Notes (Signed)
Discharge summary faxed to [facility]. Called report/updates to Evette with [facility]. Discharge instructions given to patient and patient sister with understanding. Prescriptions given to pt as well as DNR and MOST form. Questions answered.  PICC line was d/c'd by IV team.  O2 delivered for transport use by [facility], home O2 delivered per sister.  Patient taken to car by wheelchair with all belongings.

## 2017-12-13 NOTE — Discharge Summary (Addendum)
Physician Discharge Summary  [name] [name] CHY:[id number] DOB: Feb 24, 1956 DOA: 12/11/2017  PCP: Pleas Koch, NP  Admit date: 12/11/2017 Discharge date: 12/13/2017  Admitted From: Home Disposition: Home with hospice  Recommendations for Outpatient Follow-up:  1. Home with hospice  Home Health: Hospice Equipment/Devices: Oxygen  Discharge Condition: Hospice/comfort care CODE STATUS: DNR/Comfort care Diet recommendation: Regular diet   Brief/Interim Summary:  [name] C Cochranis a 62 y.o.femalewitha history of bronchiectasis, bronchopleural fistula, and atypical mycobacterial infection s/p RU lobectomy 2013 who was recently admitted in [facility] for fever with pneumonia and had a protracted stay from 11/27-12/27/2018 during which patient had repeated CTs and had also undergone bronchoscopy because of patient's pneumonia was not clear and eventually was discharged home on vancomycin, cefepime, flagyl, and posaconazole with plans to follow up 12/26/2017. She presented 1/7 afterwaking up at 2am coughing with significant subsequent hemoptysis. She became short of breath and EMS was called and during which patient was found to be hypoxic. In the ED she had no further hemoptysis, though hgb was 9.9. She reported being given IV iron for anemia at [facility] recently. CT chest showed bilateral infiltrative process concerning for pneumonia with cavitary process in the right apex which has progressed from prior scans.Pulmonary/critical care was consulted, though patient declines interventions and opts for home hospice care at discharge. Palliative care consulted and home hospice set up on discharge. Patient with continued hemoptysis secondary to pneumonia. She will need to continue her potassium supplementation secondary to recurrent hypokalemia.  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 Makes colon bleed   Alendronate Sodium Other (See Comments)   chest pain   Antihistamines, Chlorpheniramine-type Other (See Comments)   "makes lungs bleed"   Benadryl [diphenhydramine] Other (See Comments)   "makes lungs bleed" (patient states she can tolerate the newer ones, like Claritin)   Levofloxacin Anxiety   Nsaids Other (See Comments)   ischemic colitis   Other Other (See Comments)   (All anti-inflammatories) ischemic colitis   Pheniramine Other (See Comments)   "makes lungs bleed"   Pseudoephedrine Other (See Comments)   Ischemic colitis   Alendronate Sodium Other (See Comments)   Heart burn   Guaifenesin Itching   Robitussin A-c [guaifenesin-codeine] Itching, Other (See Comments)   Palms itching      Medication List    STOP taking these medications   acetaminophen 500 MG tablet Commonly known as:  TYLENOL   aspirin 81 MG chewable tablet   Cefepime HCl 2 GM/100ML Soln   metroNIDAZOLE 500 MG tablet Commonly known as:  FLAGYL   posaconazole 100 MG Tbec delayed-release tablet Commonly known as:  NOXAFIL   vancomycin 1,250 mg in sodium chloride 0.9 % 250 mL     TAKE these medications   ALPRAZolam 0.25 MG tablet Commonly known as:  XANAX Take 1 tablet (0.25 mg total) by mouth 3 (three) times daily as needed for  anxiety or sleep (use up to 3x daily for anxiety). What changed:    when to take this  reasons to take this   haloperidol 2 MG/ML solution Commonly known as:  HALDOL Take 1 mL (2 mg total) by mouth every 8 (eight)  hours as needed for agitation (severe discomfort).   HYDROcodone-acetaminophen 7.5-325 MG tablet Commonly known as:  NORCO Take 1 tablet by mouth every 6 (six) hours as needed for moderate pain.   lidocaine 5 % Commonly known as:  LIDODERM Place 1 patch onto the skin daily as needed (pain).   LYSINE PO Take 1 tablet by mouth daily.   Magnesium 400 MG Tabs Take 1 tablet by mouth daily.   methocarbamol 500 MG tablet Commonly known as:  ROBAXIN Take 1 tablet (500 mg total) by mouth every 8 (eight) hours as needed for muscle spasms. What changed:  how much to take   morphine CONCENTRATE 10 MG/0.5ML Soln concentrated solution Place 0.25 mLs (5 mg total) under the tongue every hour as needed for shortness of breath (air hunger, discomfort).   NICORETTE 4 MG lozenge Generic drug:  nicotine polacrilex Place 4 mg inside cheek every 3 (three) hours as needed for smoking cessation. For nicotine cravings.   pantoprazole 40 MG tablet Commonly known as:  PROTONIX Take 1 tablet (40 mg total) by mouth 2 (two) times daily before a meal.   Potassium Gluconate 595 MG Caps Take 1 capsule by mouth daily.   PROVENTIL HFA 108 (90 Base) MCG/ACT inhaler Generic drug:  albuterol Inhale 2 puffs into the lungs every 4 (four) hours as needed for wheezing or shortness of breath.   QVAR REDIHALER 80 MCG/ACT inhaler Generic drug:  beclomethasone Inhale 2 puffs into the lungs 2 (two) times daily.   senna 8.6 MG Tabs tablet Commonly known as:  SENOKOT Take 2 tablets (17.2 mg total) by mouth at bedtime.   sodium chloride HYPERTONIC 3 % nebulizer solution Inhale 4 mL by nebulization Two (2) times a day. Inhale 4 ml twice daily   vitamin B-12 100 MCG tablet Commonly known as:  CYANOCOBALAMIN Take 100 mcg by mouth daily.   VITAMIN D PO Take 1 tablet by mouth daily.   ZOFRAN PO Take 4 mg by mouth every 6 (six) hours as needed (nausea).      Follow-up Information    [facility]  Follow up.   Specialty:  Hospice and Palliative Medicine Contact information: [place] [address] [phone]          Allergies  Allergen Reactions  . Diphenhydramine Hcl Other (See Comments)    Makes lungs bleed  . Pseudoephedrine Hcl Other (See Comments)    Has Ischemic Colitis. Makes colon bleed  . Alendronate Sodium Other (See Comments)    chest pain  . Antihistamines, Chlorpheniramine-Type Other (See Comments)    "makes lungs bleed"  . Benadryl [Diphenhydramine] Other (See Comments)    "makes lungs bleed" (patient states she can tolerate the newer ones, like Claritin)  . Levofloxacin Anxiety  . Nsaids Other (See Comments)    ischemic colitis  . Other Other (See Comments)    (All anti-inflammatories) ischemic colitis  . Pheniramine Other (See Comments)    "makes lungs bleed"  . Pseudoephedrine Other (See Comments)    Ischemic colitis  . Alendronate Sodium Other (See Comments)    Heart burn  . Guaifenesin Itching  . Robitussin A-C [Guaifenesin-Codeine] Itching and Other (See Comments)    Palms itching  Consultations:  Critical care/pulmonology  Palliative care medicine   Procedures/Studies: Ct Angio Chest Pe W And/or Wo Contrast  Addendum Date: 12/12/2017   ADDENDUM REPORT: 12/12/2017 13:30 ADDENDUM: The original report was by Dr. Jeb Levering. The following addendum is by Dr. Van Clines: I received a phone call today from pulmonology at about 1:15 p.m. regarding changes compared to the prior chest CT from [facility] dated 11/23/2017 which had been intervally loaded into the [facility] system after the original dictation was issued. Comparing Dr. Andrew Au original report and looking over the [facility] exam, my impression is that on the 12/11/2017 exam there is increase in fluid and/or airspace opacity along the posterior loculations of the right apical cavity, along with increase in airspace opacity in the superior segment  right lower lobe adjacent to the cavity. The airspace opacity in the posterior basal segment right lower lobe shows increasing consolidation. In addition, as of 12/11/2017 there are faint bilateral ground-glass opacities which are increased from the [facility] exam of 11/23/2017, and nonspecific but possibly from edema. Electronically Signed   By: Van Clines M.D.   On: 12/12/2017 13:30   Result Date: 12/12/2017 CLINICAL DATA:  Hemoptysis today. Shortness of breath. Recent admission for 1 month at [facility] with pneumonia. EXAM: CT ANGIOGRAPHY CHEST WITH CONTRAST TECHNIQUE: Multidetector CT imaging of the chest was performed using the standard protocol during bolus administration of intravenous contrast. Multiplanar CT image reconstructions and MIPs were obtained to evaluate the vascular anatomy. CONTRAST:  76mL ISOVUE-370 IOPAMIDOL (ISOVUE-370) INJECTION 76% COMPARISON:  Chest CT 08/09/2017. Chest CT report from 11/24/2017 at an outside institution, no images available. FINDINGS: Cardiovascular: There are no filling defects within the pulmonary arteries to suggest pulmonary embolus. Thoracic aorta is normal in caliber with aortic atherosclerosis. Mild right heart dilatation. Minimal coronary artery calcifications. No pericardial fluid. Left upper extremity PICC with tip in the SVC. Mediastinum/Nodes: Increase subcarinal soft tissue density since September 2018 exam measuring 13 mm short axis, previously 10 mm. No left hilar adenopathy. Soft tissue density at the right hilum likely hilar nodes but ill-defined. Lungs/Pleura: Postsurgical change in the right hemithorax post right upper lobectomy. Right apical cavity, increased wall thickening from September 2018 CT. Increased soft tissue density adjacent to sutures in the adjacent right lower lobe. Patchy and ground-glass opacities in the more inferior right lower lobe, confluent dependently with small right pleural effusion. Small right lower lobe paramediastinal nodular  opacity of the pleural tail and is favored to be atelectasis/scarring. Minimal patchy/ ground-glass opacity in the left lower lobe. Advanced emphysema. Upper Abdomen: No acute abnormality. Musculoskeletal: Unchanged vertebral body hemangioma within T10, likely smaller hemangiomas at additional levels. No acute osseous abnormality. Postsurgical change in the right ribs. Review of the MIP images confirms the above findings. IMPRESSION: 1. No pulmonary embolus. 2. Recent outside hospital CTs are not available for direct comparison, reports were reviewed. Post right upper lobectomy with thick-walled cavity at the right lung apex. The thick-walled component was described on recent outside CT, however has progressed from September 2018. 3. Increased soft tissue density adjacent to suture line in the right lung from September 2018, nonspecific imaging features, may be secondary to infection or atelectasis. 4. Patchy, ground-glass, and consolidative multifocal right lower lobe opacities with small right pleural effusion, suspicious for pneumonia. 5. Patchy ground-glass opacity in the left lower lobe is nonspecific and may be infectious or inflammatory. 6. Prominent subcarinal node is likely reactive. 7. Aortic Atherosclerosis (ICD10-I70.0) and Emphysema (ICD10-J43.9). Electronically Signed:  By: Jeb Levering M.D. On: 12/11/2017 04:25   Dg Chest Portable 1 View  Result Date: 12/11/2017 CLINICAL DATA:  62 y/o F; right-sided chest pain and coughing bright red blood. Increase shortness of breath. EXAM: PORTABLE CHEST 1 VIEW COMPARISON:  09/05/2017 chest radiograph.  08/09/2017 chest CT. FINDINGS: Stable cardiac silhouette given projection and technique. Stable postsurgical changes in the right upper lung zone. Increased opacification of the apical cavity may represent fluid/hemorrhage. Increased reticular opacities of the aerated right lung, likely bronchitic changes. Clear left lung. Stable rightward mediastinal shift  from volume loss in right hemithorax. IMPRESSION: Increased attenuation of right apical cavity, possibly fluid/hemorrhage. Bronchitic changes in aerated right lung. Clear left lung. Electronically Signed   By: Kristine Garbe M.D.   On: 12/11/2017 03:05      Subjective: Hemoptysis overnight. Coughing. She feels the best she has felt in a long time.  Discharge Exam: Vitals:   12/13/17 0354 12/13/17 0806  BP: 108/81 101/70  Pulse: 76 76  Resp: (!) 24 (!) 30  Temp: 98.4 F (36.9 C) 99.5 F (37.5 C)  SpO2: 99% 97%   Vitals:   12/12/17 0800 12/12/17 1000 12/13/17 0354 12/13/17 0806  BP: 127/64  108/81 101/70  Pulse: 73  76 76  Resp: 20  (!) 24 (!) 30  Temp: 100 F (37.8 C) 98.8 F (37.1 C) 98.4 F (36.9 C) 99.5 F (37.5 C)  TempSrc: Oral Oral Oral Oral  SpO2: 100%  99% 97%  Weight:        General: Pt is alert, awake, not in acute distress Cardiovascular: RRR, S1/S2 +, no rubs, no gallops Respiratory: CTA bilaterally with diminished breath sounds at bases, no wheezing, no rhonchi Abdominal: Soft, NT, ND, bowel sounds + Extremities: no edema, no cyanosis    The results of significant diagnostics from this hospitalization (including imaging, microbiology, ancillary and laboratory) are listed below for reference.     Microbiology: Recent Results (from the past 240 hour(s))  Blood culture (routine x 2)     Status: None (Preliminary result)   Collection Time: 12/11/17  4:49 AM  Result Value Ref Range Status   Specimen Description BLOOD RIGHT LOWER ARM  Final   Special Requests   Final    BOTTLES DRAWN AEROBIC AND ANAEROBIC Blood Culture adequate volume   Culture NO GROWTH 1 DAY  Final   Report Status PENDING  Incomplete  Blood culture (routine x 2)     Status: None (Preliminary result)   Collection Time: 12/11/17  5:00 AM  Result Value Ref Range Status   Specimen Description BLOOD RIGHT HAND  Final   Special Requests   Final    BOTTLES DRAWN AEROBIC AND  ANAEROBIC Blood Culture adequate volume   Culture NO GROWTH 1 DAY  Final   Report Status PENDING  Incomplete  Respiratory Panel by PCR     Status: None   Collection Time: 12/11/17  5:21 AM  Result Value Ref Range Status   Adenovirus NOT DETECTED NOT DETECTED Final   Coronavirus 229E NOT DETECTED NOT DETECTED Final   Coronavirus HKU1 NOT DETECTED NOT DETECTED Final   Coronavirus NL63 NOT DETECTED NOT DETECTED Final   Coronavirus OC43 NOT DETECTED NOT DETECTED Final   Metapneumovirus NOT DETECTED NOT DETECTED Final   Rhinovirus / Enterovirus NOT DETECTED NOT DETECTED Final   Influenza A NOT DETECTED NOT DETECTED Final   Influenza B NOT DETECTED NOT DETECTED Final   Parainfluenza Virus 1 NOT DETECTED NOT DETECTED  Final   Parainfluenza Virus 2 NOT DETECTED NOT DETECTED Final   Parainfluenza Virus 3 NOT DETECTED NOT DETECTED Final   Parainfluenza Virus 4 NOT DETECTED NOT DETECTED Final   Respiratory Syncytial Virus NOT DETECTED NOT DETECTED Final   Bordetella pertussis NOT DETECTED NOT DETECTED Final   Chlamydophila pneumoniae NOT DETECTED NOT DETECTED Final   Mycoplasma pneumoniae NOT DETECTED NOT DETECTED Final  MRSA PCR Screening     Status: None   Collection Time: 12/11/17 11:37 PM  Result Value Ref Range Status   MRSA by PCR NEGATIVE NEGATIVE Final    Comment:        The GeneXpert MRSA Assay (FDA approved for NASAL specimens only), is one component of a comprehensive MRSA colonization surveillance program. It is not intended to diagnose MRSA infection nor to guide or monitor treatment for MRSA infections.      Labs: BNP (last 3 results) No results for input(s): BNP in the last 8760 hours. Basic Metabolic Panel: Recent Labs  Lab 12/11/17 0256 12/11/17 0312 12/11/17 0316 12/12/17 0531 12/12/17 1608 12/13/17 0828  NA 140 143  --  137 136 139  K 2.1* 2.1*  --  <2.0* 2.1* 3.1*  CL 99* 100*  --  96* 98* 104  CO2 27  --   --  28 26 24   GLUCOSE 107* 109*  --  91 151*  123*  BUN 4* <3*  --  <5* <5* <5*  CREATININE 0.76 0.70  --  0.71 0.77 0.58  CALCIUM 8.0*  --   --  7.5* 7.6* 7.7*  MG  --   --  1.8 1.7  --   --    Liver Function Tests: Recent Labs  Lab 12/11/17 0256  AST 28  ALT 10*  ALKPHOS 71  BILITOT 1.0  PROT 6.3*  ALBUMIN 2.4*   No results for input(s): LIPASE, AMYLASE in the last 168 hours. No results for input(s): AMMONIA in the last 168 hours. CBC: Recent Labs  Lab 12/11/17 0256 12/11/17 0312 12/11/17 0601 12/11/17 1710 12/12/17 0531  WBC 9.1  --  9.8 9.1 9.6  NEUTROABS 6.3  --   --   --   --   HGB 9.9* 9.9* 9.4* 9.2* 8.9*  HCT 31.1* 29.0* 29.9* 28.4* 27.7*  MCV 98.1  --  98.4 98.3 98.6  PLT 642*  --  607* 545* 545*   Cardiac Enzymes: No results for input(s): CKTOTAL, CKMB, CKMBINDEX, TROPONINI in the last 168 hours. BNP: Invalid input(s): POCBNP CBG: Recent Labs  Lab 12/11/17 0830 12/11/17 2334 12/12/17 0543  GLUCAP 90 83 80   D-Dimer No results for input(s): DDIMER in the last 72 hours. Hgb A1c No results for input(s): HGBA1C in the last 72 hours. Lipid Profile No results for input(s): CHOL, HDL, LDLCALC, TRIG, CHOLHDL, LDLDIRECT in the last 72 hours. Thyroid function studies No results for input(s): TSH, T4TOTAL, T3FREE, THYROIDAB in the last 72 hours.  Invalid input(s): FREET3 Anemia work up No results for input(s): VITAMINB12, FOLATE, FERRITIN, TIBC, IRON, RETICCTPCT in the last 72 hours. Urinalysis    Component Value Date/Time   COLORURINE YELLOW 08/17/2016 1228   APPEARANCEUR CLOUDY (A) 08/17/2016 1228   LABSPEC 1.010 08/17/2016 1228   PHURINE 7.0 08/17/2016 1228   GLUCOSEU NEGATIVE 08/17/2016 1228   GLUCOSEU NEGATIVE 04/25/2016 1040   HGBUR 1+ (A) 08/17/2016 1228   HGBUR negative 11/25/2008 0830   BILIRUBINUR negative 10/31/2017 [place] (A) 08/17/2016 1228   PROTEINUR  negative 10/31/2017 0951   PROTEINUR NEGATIVE 08/17/2016 1228   UROBILINOGEN 0.2 10/31/2017 0951    UROBILINOGEN 0.2 04/25/2016 1040   NITRITE negative 10/31/2017 0951   NITRITE NEGATIVE 08/17/2016 1228   LEUKOCYTESUR Negative 10/31/2017 0951   Sepsis Labs Invalid input(s): PROCALCITONIN,  WBC,  LACTICIDVEN Microbiology Recent Results (from the past 240 hour(s))  Blood culture (routine x 2)     Status: None (Preliminary result)   Collection Time: 12/11/17  4:49 AM  Result Value Ref Range Status   Specimen Description BLOOD RIGHT LOWER ARM  Final   Special Requests   Final    BOTTLES DRAWN AEROBIC AND ANAEROBIC Blood Culture adequate volume   Culture NO GROWTH 1 DAY  Final   Report Status PENDING  Incomplete  Blood culture (routine x 2)     Status: None (Preliminary result)   Collection Time: 12/11/17  5:00 AM  Result Value Ref Range Status   Specimen Description BLOOD RIGHT HAND  Final   Special Requests   Final    BOTTLES DRAWN AEROBIC AND ANAEROBIC Blood Culture adequate volume   Culture NO GROWTH 1 DAY  Final   Report Status PENDING  Incomplete  Respiratory Panel by PCR     Status: None   Collection Time: 12/11/17  5:21 AM  Result Value Ref Range Status   Adenovirus NOT DETECTED NOT DETECTED Final   Coronavirus 229E NOT DETECTED NOT DETECTED Final   Coronavirus HKU1 NOT DETECTED NOT DETECTED Final   Coronavirus NL63 NOT DETECTED NOT DETECTED Final   Coronavirus OC43 NOT DETECTED NOT DETECTED Final   Metapneumovirus NOT DETECTED NOT DETECTED Final   Rhinovirus / Enterovirus NOT DETECTED NOT DETECTED Final   Influenza A NOT DETECTED NOT DETECTED Final   Influenza B NOT DETECTED NOT DETECTED Final   Parainfluenza Virus 1 NOT DETECTED NOT DETECTED Final   Parainfluenza Virus 2 NOT DETECTED NOT DETECTED Final   Parainfluenza Virus 3 NOT DETECTED NOT DETECTED Final   Parainfluenza Virus 4 NOT DETECTED NOT DETECTED Final   Respiratory Syncytial Virus NOT DETECTED NOT DETECTED Final   Bordetella pertussis NOT DETECTED NOT DETECTED Final   Chlamydophila pneumoniae NOT DETECTED  NOT DETECTED Final   Mycoplasma pneumoniae NOT DETECTED NOT DETECTED Final  MRSA PCR Screening     Status: None   Collection Time: 12/11/17 11:37 PM  Result Value Ref Range Status   MRSA by PCR NEGATIVE NEGATIVE Final    Comment:        The GeneXpert MRSA Assay (FDA approved for NASAL specimens only), is one component of a comprehensive MRSA colonization surveillance program. It is not intended to diagnose MRSA infection nor to guide or monitor treatment for MRSA infections.      Time coordinating discharge: Over 30 minutes  SIGNED:   Cordelia Poche, MD [facility] 12/13/2017, 12:36 PM Pager [phone]  If 7PM-7AM, please contact night-coverage [email or web address] Password [email or web address]

## 2017-12-13 NOTE — Unmapped (Signed)
Bronchiectasis Clinic Pharmacist Note: OSH admission    CPP called [facility] to f/u on expected labs from yesterday. However, upon further review of patient chart in Care Everywhere, patient looks to be admitted at [facility] d/t hemoptysis and SOB. Presented via EMS.    Forwarding to Dr. Garner Nash and Rosanne Sack for further follow-up as needed.      Anell Barr, PharmD, BCPS, CPP  Clinical Pharmacist Practitioner  [facility] / [facility]  Pager: [phone]      CC: M. Rolm Baptise, Rosanne Sack, RN

## 2017-12-13 NOTE — Unmapped (Signed)
Advised [facility] of same at [phone].

## 2017-12-14 DIAGNOSIS — J189 Pneumonia, unspecified organism: Secondary | ICD-10-CM | POA: Diagnosis not present

## 2017-12-14 DIAGNOSIS — K219 Gastro-esophageal reflux disease without esophagitis: Secondary | ICD-10-CM | POA: Diagnosis not present

## 2017-12-14 DIAGNOSIS — D638 Anemia in other chronic diseases classified elsewhere: Secondary | ICD-10-CM | POA: Diagnosis not present

## 2017-12-14 DIAGNOSIS — J9620 Acute and chronic respiratory failure, unspecified whether with hypoxia or hypercapnia: Secondary | ICD-10-CM | POA: Diagnosis not present

## 2017-12-14 DIAGNOSIS — J449 Chronic obstructive pulmonary disease, unspecified: Secondary | ICD-10-CM | POA: Diagnosis not present

## 2017-12-14 DIAGNOSIS — E46 Unspecified protein-calorie malnutrition: Secondary | ICD-10-CM | POA: Diagnosis not present

## 2017-12-14 DIAGNOSIS — J860 Pyothorax with fistula: Secondary | ICD-10-CM | POA: Diagnosis not present

## 2017-12-14 DIAGNOSIS — J471 Bronchiectasis with (acute) exacerbation: Secondary | ICD-10-CM | POA: Diagnosis not present

## 2017-12-15 ENCOUNTER — Telehealth (HOSPITAL_COMMUNITY): Payer: Self-pay

## 2017-12-15 DIAGNOSIS — J449 Chronic obstructive pulmonary disease, unspecified: Secondary | ICD-10-CM | POA: Diagnosis not present

## 2017-12-15 DIAGNOSIS — E46 Unspecified protein-calorie malnutrition: Secondary | ICD-10-CM | POA: Diagnosis not present

## 2017-12-15 DIAGNOSIS — J189 Pneumonia, unspecified organism: Secondary | ICD-10-CM | POA: Diagnosis not present

## 2017-12-15 DIAGNOSIS — J9620 Acute and chronic respiratory failure, unspecified whether with hypoxia or hypercapnia: Secondary | ICD-10-CM | POA: Diagnosis not present

## 2017-12-15 DIAGNOSIS — J860 Pyothorax with fistula: Secondary | ICD-10-CM | POA: Diagnosis not present

## 2017-12-15 DIAGNOSIS — D638 Anemia in other chronic diseases classified elsewhere: Secondary | ICD-10-CM | POA: Diagnosis not present

## 2017-12-15 DIAGNOSIS — K219 Gastro-esophageal reflux disease without esophagitis: Secondary | ICD-10-CM | POA: Diagnosis not present

## 2017-12-15 DIAGNOSIS — J471 Bronchiectasis with (acute) exacerbation: Secondary | ICD-10-CM | POA: Diagnosis not present

## 2017-12-15 NOTE — Telephone Encounter (Signed)
Patient called to cancel all future appointments, she was in the hospital and has been discharged with hospice.

## 2017-12-16 DIAGNOSIS — D638 Anemia in other chronic diseases classified elsewhere: Secondary | ICD-10-CM | POA: Diagnosis not present

## 2017-12-16 DIAGNOSIS — K219 Gastro-esophageal reflux disease without esophagitis: Secondary | ICD-10-CM | POA: Diagnosis not present

## 2017-12-16 DIAGNOSIS — J449 Chronic obstructive pulmonary disease, unspecified: Secondary | ICD-10-CM | POA: Diagnosis not present

## 2017-12-16 DIAGNOSIS — J860 Pyothorax with fistula: Secondary | ICD-10-CM | POA: Diagnosis not present

## 2017-12-16 DIAGNOSIS — J9620 Acute and chronic respiratory failure, unspecified whether with hypoxia or hypercapnia: Secondary | ICD-10-CM | POA: Diagnosis not present

## 2017-12-16 DIAGNOSIS — E46 Unspecified protein-calorie malnutrition: Secondary | ICD-10-CM | POA: Diagnosis not present

## 2017-12-16 DIAGNOSIS — J189 Pneumonia, unspecified organism: Secondary | ICD-10-CM | POA: Diagnosis not present

## 2017-12-16 DIAGNOSIS — J471 Bronchiectasis with (acute) exacerbation: Secondary | ICD-10-CM | POA: Diagnosis not present

## 2017-12-16 LAB — CULTURE, BLOOD (ROUTINE X 2)
CULTURE: NO GROWTH
CULTURE: NO GROWTH
SPECIAL REQUESTS: ADEQUATE
Special Requests: ADEQUATE

## 2017-12-17 DIAGNOSIS — J471 Bronchiectasis with (acute) exacerbation: Secondary | ICD-10-CM | POA: Diagnosis not present

## 2017-12-17 DIAGNOSIS — J449 Chronic obstructive pulmonary disease, unspecified: Secondary | ICD-10-CM | POA: Diagnosis not present

## 2017-12-17 DIAGNOSIS — J9620 Acute and chronic respiratory failure, unspecified whether with hypoxia or hypercapnia: Secondary | ICD-10-CM | POA: Diagnosis not present

## 2017-12-17 DIAGNOSIS — J189 Pneumonia, unspecified organism: Secondary | ICD-10-CM | POA: Diagnosis not present

## 2017-12-17 DIAGNOSIS — J860 Pyothorax with fistula: Secondary | ICD-10-CM | POA: Diagnosis not present

## 2017-12-17 DIAGNOSIS — E46 Unspecified protein-calorie malnutrition: Secondary | ICD-10-CM | POA: Diagnosis not present

## 2017-12-17 DIAGNOSIS — K219 Gastro-esophageal reflux disease without esophagitis: Secondary | ICD-10-CM | POA: Diagnosis not present

## 2017-12-17 DIAGNOSIS — D638 Anemia in other chronic diseases classified elsewhere: Secondary | ICD-10-CM | POA: Diagnosis not present

## 2017-12-18 ENCOUNTER — Other Ambulatory Visit: Payer: Self-pay | Admitting: *Deleted

## 2017-12-18 DIAGNOSIS — J471 Bronchiectasis with (acute) exacerbation: Secondary | ICD-10-CM | POA: Diagnosis not present

## 2017-12-18 DIAGNOSIS — J189 Pneumonia, unspecified organism: Secondary | ICD-10-CM | POA: Diagnosis not present

## 2017-12-18 DIAGNOSIS — J449 Chronic obstructive pulmonary disease, unspecified: Secondary | ICD-10-CM | POA: Diagnosis not present

## 2017-12-18 DIAGNOSIS — J9620 Acute and chronic respiratory failure, unspecified whether with hypoxia or hypercapnia: Secondary | ICD-10-CM | POA: Diagnosis not present

## 2017-12-18 DIAGNOSIS — J860 Pyothorax with fistula: Secondary | ICD-10-CM | POA: Diagnosis not present

## 2017-12-18 DIAGNOSIS — K219 Gastro-esophageal reflux disease without esophagitis: Secondary | ICD-10-CM | POA: Diagnosis not present

## 2017-12-18 DIAGNOSIS — D638 Anemia in other chronic diseases classified elsewhere: Secondary | ICD-10-CM | POA: Diagnosis not present

## 2017-12-18 DIAGNOSIS — E46 Unspecified protein-calorie malnutrition: Secondary | ICD-10-CM | POA: Diagnosis not present

## 2017-12-18 NOTE — Patient Outreach (Addendum)
[facility]) Care Management  5/85/2778  [name] [name] [birth year] [id number]   Per chart review, patient hospitalized 12/11/17 -19/19 for Hemoptysis, and discharged home with hospice care.   Patient not eligible for [facility] Care Management services and will proceed with case closure.   RNCM sent case closure request due to patient being ineligible for services, noneligible payer, other CM program,  to [facility].    Lashonta Pilling H. Annia Friendly, BSN, [place] Management [facility] Telephonic CM Phone: [phone] Fax: [phone]

## 2017-12-19 DIAGNOSIS — J9620 Acute and chronic respiratory failure, unspecified whether with hypoxia or hypercapnia: Secondary | ICD-10-CM | POA: Diagnosis not present

## 2017-12-19 DIAGNOSIS — D638 Anemia in other chronic diseases classified elsewhere: Secondary | ICD-10-CM | POA: Diagnosis not present

## 2017-12-19 DIAGNOSIS — J189 Pneumonia, unspecified organism: Secondary | ICD-10-CM | POA: Diagnosis not present

## 2017-12-19 DIAGNOSIS — J471 Bronchiectasis with (acute) exacerbation: Secondary | ICD-10-CM | POA: Diagnosis not present

## 2017-12-19 DIAGNOSIS — K219 Gastro-esophageal reflux disease without esophagitis: Secondary | ICD-10-CM | POA: Diagnosis not present

## 2017-12-19 DIAGNOSIS — J860 Pyothorax with fistula: Secondary | ICD-10-CM | POA: Diagnosis not present

## 2017-12-19 DIAGNOSIS — E46 Unspecified protein-calorie malnutrition: Secondary | ICD-10-CM | POA: Diagnosis not present

## 2017-12-19 DIAGNOSIS — J449 Chronic obstructive pulmonary disease, unspecified: Secondary | ICD-10-CM | POA: Diagnosis not present

## 2017-12-20 ENCOUNTER — Encounter: Payer: Self-pay | Admitting: Primary Care

## 2017-12-20 DIAGNOSIS — D638 Anemia in other chronic diseases classified elsewhere: Secondary | ICD-10-CM | POA: Diagnosis not present

## 2017-12-20 DIAGNOSIS — J449 Chronic obstructive pulmonary disease, unspecified: Secondary | ICD-10-CM | POA: Diagnosis not present

## 2017-12-20 DIAGNOSIS — J471 Bronchiectasis with (acute) exacerbation: Secondary | ICD-10-CM | POA: Diagnosis not present

## 2017-12-20 DIAGNOSIS — J189 Pneumonia, unspecified organism: Secondary | ICD-10-CM | POA: Diagnosis not present

## 2017-12-20 DIAGNOSIS — J9620 Acute and chronic respiratory failure, unspecified whether with hypoxia or hypercapnia: Secondary | ICD-10-CM | POA: Diagnosis not present

## 2017-12-20 DIAGNOSIS — E46 Unspecified protein-calorie malnutrition: Secondary | ICD-10-CM | POA: Diagnosis not present

## 2017-12-20 DIAGNOSIS — J860 Pyothorax with fistula: Secondary | ICD-10-CM | POA: Diagnosis not present

## 2017-12-20 DIAGNOSIS — K219 Gastro-esophageal reflux disease without esophagitis: Secondary | ICD-10-CM | POA: Diagnosis not present

## 2017-12-21 DIAGNOSIS — J449 Chronic obstructive pulmonary disease, unspecified: Secondary | ICD-10-CM | POA: Diagnosis not present

## 2017-12-21 DIAGNOSIS — D638 Anemia in other chronic diseases classified elsewhere: Secondary | ICD-10-CM | POA: Diagnosis not present

## 2017-12-21 DIAGNOSIS — J860 Pyothorax with fistula: Secondary | ICD-10-CM | POA: Diagnosis not present

## 2017-12-21 DIAGNOSIS — J9620 Acute and chronic respiratory failure, unspecified whether with hypoxia or hypercapnia: Secondary | ICD-10-CM | POA: Diagnosis not present

## 2017-12-21 DIAGNOSIS — J471 Bronchiectasis with (acute) exacerbation: Secondary | ICD-10-CM | POA: Diagnosis not present

## 2017-12-21 DIAGNOSIS — J189 Pneumonia, unspecified organism: Secondary | ICD-10-CM | POA: Diagnosis not present

## 2017-12-21 DIAGNOSIS — K219 Gastro-esophageal reflux disease without esophagitis: Secondary | ICD-10-CM | POA: Diagnosis not present

## 2017-12-21 DIAGNOSIS — E46 Unspecified protein-calorie malnutrition: Secondary | ICD-10-CM | POA: Diagnosis not present

## 2017-12-22 DIAGNOSIS — J9620 Acute and chronic respiratory failure, unspecified whether with hypoxia or hypercapnia: Secondary | ICD-10-CM | POA: Diagnosis not present

## 2017-12-22 DIAGNOSIS — K219 Gastro-esophageal reflux disease without esophagitis: Secondary | ICD-10-CM | POA: Diagnosis not present

## 2017-12-22 DIAGNOSIS — J471 Bronchiectasis with (acute) exacerbation: Secondary | ICD-10-CM | POA: Diagnosis not present

## 2017-12-22 DIAGNOSIS — J189 Pneumonia, unspecified organism: Secondary | ICD-10-CM | POA: Diagnosis not present

## 2017-12-22 DIAGNOSIS — E46 Unspecified protein-calorie malnutrition: Secondary | ICD-10-CM | POA: Diagnosis not present

## 2017-12-22 DIAGNOSIS — D638 Anemia in other chronic diseases classified elsewhere: Secondary | ICD-10-CM | POA: Diagnosis not present

## 2017-12-22 DIAGNOSIS — J449 Chronic obstructive pulmonary disease, unspecified: Secondary | ICD-10-CM | POA: Diagnosis not present

## 2017-12-22 DIAGNOSIS — J860 Pyothorax with fistula: Secondary | ICD-10-CM | POA: Diagnosis not present

## 2017-12-23 DIAGNOSIS — J860 Pyothorax with fistula: Secondary | ICD-10-CM | POA: Diagnosis not present

## 2017-12-23 DIAGNOSIS — J9620 Acute and chronic respiratory failure, unspecified whether with hypoxia or hypercapnia: Secondary | ICD-10-CM | POA: Diagnosis not present

## 2017-12-23 DIAGNOSIS — J449 Chronic obstructive pulmonary disease, unspecified: Secondary | ICD-10-CM | POA: Diagnosis not present

## 2017-12-23 DIAGNOSIS — K219 Gastro-esophageal reflux disease without esophagitis: Secondary | ICD-10-CM | POA: Diagnosis not present

## 2017-12-23 DIAGNOSIS — E46 Unspecified protein-calorie malnutrition: Secondary | ICD-10-CM | POA: Diagnosis not present

## 2017-12-23 DIAGNOSIS — D638 Anemia in other chronic diseases classified elsewhere: Secondary | ICD-10-CM | POA: Diagnosis not present

## 2017-12-23 DIAGNOSIS — J471 Bronchiectasis with (acute) exacerbation: Secondary | ICD-10-CM | POA: Diagnosis not present

## 2017-12-23 DIAGNOSIS — J189 Pneumonia, unspecified organism: Secondary | ICD-10-CM | POA: Diagnosis not present

## 2017-12-24 DIAGNOSIS — D638 Anemia in other chronic diseases classified elsewhere: Secondary | ICD-10-CM | POA: Diagnosis not present

## 2017-12-24 DIAGNOSIS — E46 Unspecified protein-calorie malnutrition: Secondary | ICD-10-CM | POA: Diagnosis not present

## 2017-12-24 DIAGNOSIS — J449 Chronic obstructive pulmonary disease, unspecified: Secondary | ICD-10-CM | POA: Diagnosis not present

## 2017-12-24 DIAGNOSIS — J189 Pneumonia, unspecified organism: Secondary | ICD-10-CM | POA: Diagnosis not present

## 2017-12-24 DIAGNOSIS — J860 Pyothorax with fistula: Secondary | ICD-10-CM | POA: Diagnosis not present

## 2017-12-24 DIAGNOSIS — J9620 Acute and chronic respiratory failure, unspecified whether with hypoxia or hypercapnia: Secondary | ICD-10-CM | POA: Diagnosis not present

## 2017-12-24 DIAGNOSIS — J471 Bronchiectasis with (acute) exacerbation: Secondary | ICD-10-CM | POA: Diagnosis not present

## 2017-12-24 DIAGNOSIS — K219 Gastro-esophageal reflux disease without esophagitis: Secondary | ICD-10-CM | POA: Diagnosis not present

## 2017-12-25 DIAGNOSIS — J9620 Acute and chronic respiratory failure, unspecified whether with hypoxia or hypercapnia: Secondary | ICD-10-CM | POA: Diagnosis not present

## 2017-12-25 DIAGNOSIS — J189 Pneumonia, unspecified organism: Secondary | ICD-10-CM | POA: Diagnosis not present

## 2017-12-25 DIAGNOSIS — E46 Unspecified protein-calorie malnutrition: Secondary | ICD-10-CM | POA: Diagnosis not present

## 2017-12-25 DIAGNOSIS — J860 Pyothorax with fistula: Secondary | ICD-10-CM | POA: Diagnosis not present

## 2017-12-25 DIAGNOSIS — D638 Anemia in other chronic diseases classified elsewhere: Secondary | ICD-10-CM | POA: Diagnosis not present

## 2017-12-25 DIAGNOSIS — J449 Chronic obstructive pulmonary disease, unspecified: Secondary | ICD-10-CM | POA: Diagnosis not present

## 2017-12-25 DIAGNOSIS — J471 Bronchiectasis with (acute) exacerbation: Secondary | ICD-10-CM | POA: Diagnosis not present

## 2017-12-25 DIAGNOSIS — K219 Gastro-esophageal reflux disease without esophagitis: Secondary | ICD-10-CM | POA: Diagnosis not present

## 2018-01-05 DIAGNOSIS — 419620001 Death: Secondary | SNOMED CT | POA: Diagnosis not present

## 2018-01-05 DEATH — deceased

## 2018-02-28 ENCOUNTER — Ambulatory Visit: Payer: Self-pay | Admitting: Primary Care

## 2018-05-02 ENCOUNTER — Encounter: Payer: 59 | Admitting: Internal Medicine

## 2018-05-11 ENCOUNTER — Ambulatory Visit (HOSPITAL_COMMUNITY): Payer: Self-pay | Admitting: Psychiatry

## 2018-12-27 IMAGING — DX DG CHEST 1V PORT
1 series · 1 of 1 positions shown · non-contrast
Comparison: 09/05/2017 chest radiograph.  08/09/2017 chest CT.

CLINICAL DATA: 61 y/o F; right-sided chest pain and coughing bright
red blood. Increase shortness of breath.

EXAM:
PORTABLE CHEST 1 VIEW

[chest ap]
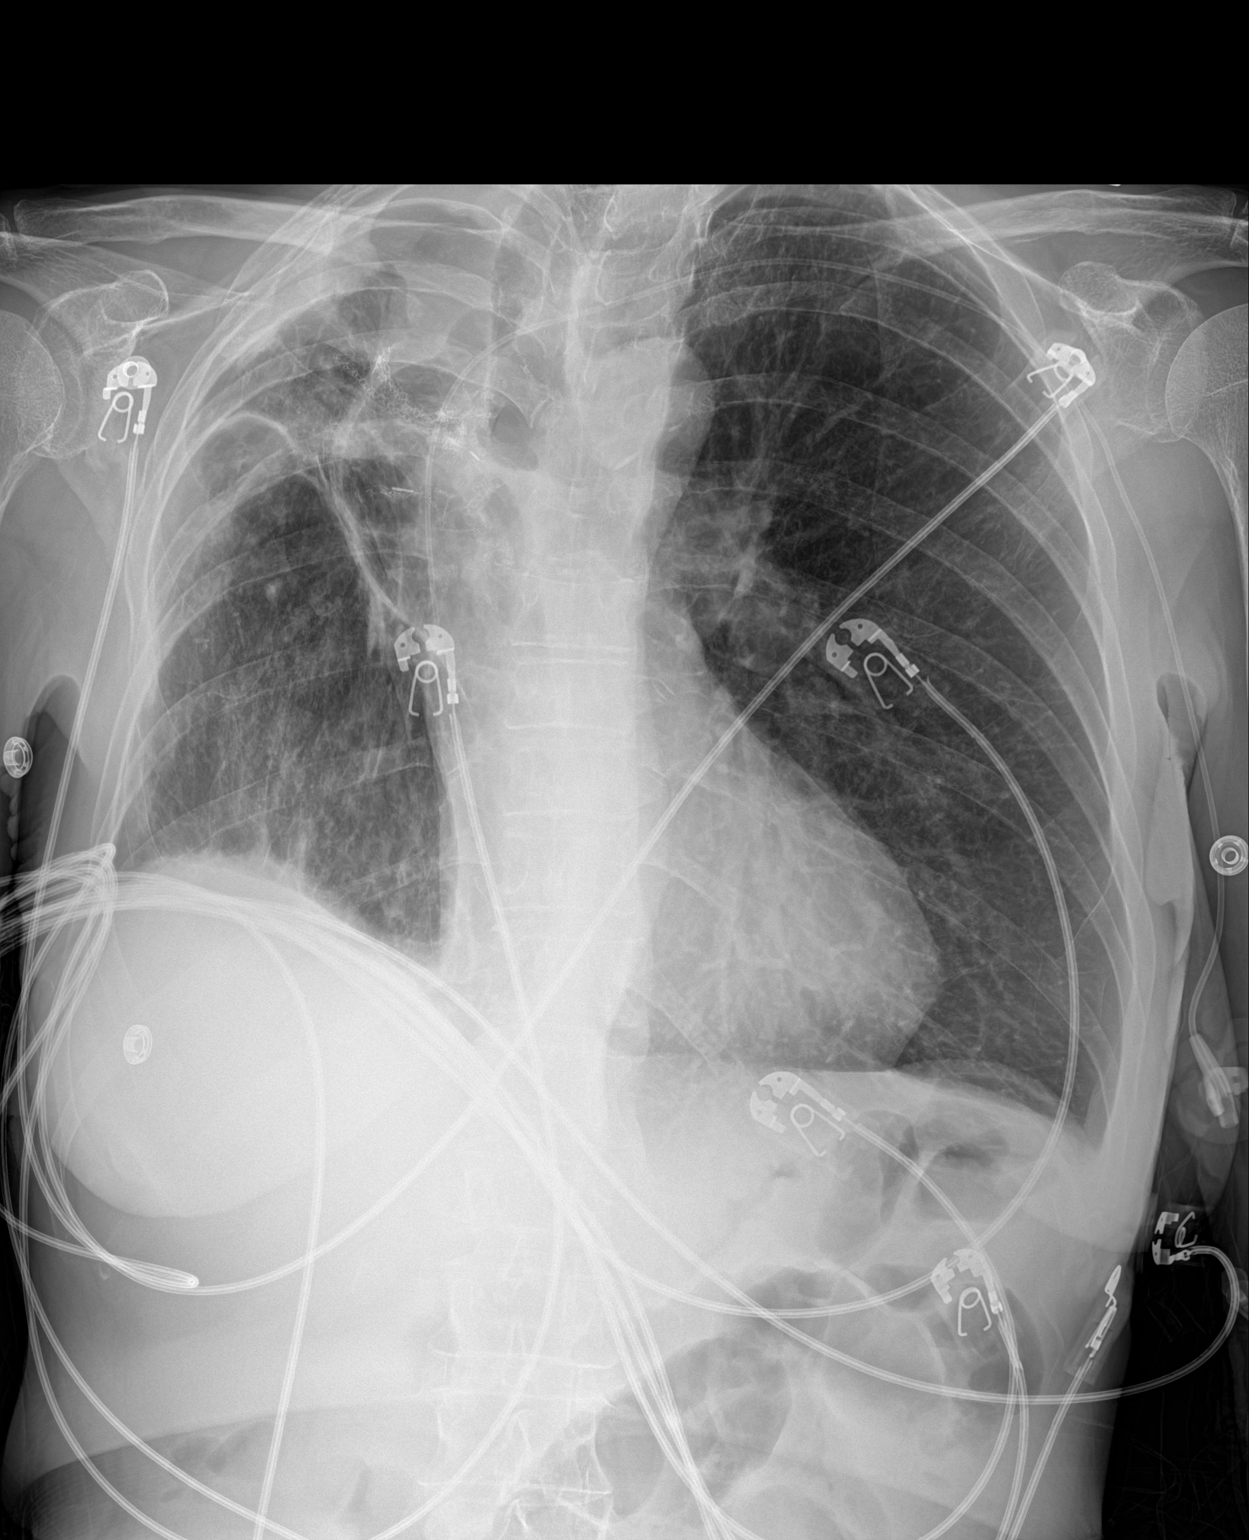

[1 of 1 positions shown; findings below may reference images not displayed]

FINDINGS: Stable cardiac silhouette given projection and technique. Stable
postsurgical changes in the right upper lung zone. Increased
opacification of the apical cavity may represent fluid/hemorrhage.
Increased reticular opacities of the aerated right lung, likely
bronchitic changes. Clear left lung. Stable rightward mediastinal
shift from volume loss in right hemithorax.
IMPRESSION: Increased attenuation of right apical cavity, possibly
fluid/hemorrhage. Bronchitic changes in aerated right lung. Clear
left lung.

By: Saima Moriarty M.D.

## 2019-09-04 NOTE — Telephone Encounter (Signed)
done
